# Patient Record
Sex: Male | Born: 1935 | Race: White | Hispanic: No | Marital: Married | Smoking: Former smoker
Health system: Southern US, Community
[De-identification: ages and names within clinical notes are randomized; demographics above are authoritative.]

## PROBLEM LIST (undated history)

## (undated) DIAGNOSIS — D631 Anemia in chronic kidney disease: Principal | ICD-10-CM

## (undated) DIAGNOSIS — N400 Enlarged prostate without lower urinary tract symptoms: Secondary | ICD-10-CM

## (undated) DIAGNOSIS — I2510 Atherosclerotic heart disease of native coronary artery without angina pectoris: Secondary | ICD-10-CM

## (undated) DIAGNOSIS — K449 Diaphragmatic hernia without obstruction or gangrene: Secondary | ICD-10-CM

## (undated) DIAGNOSIS — I10 Essential (primary) hypertension: Secondary | ICD-10-CM

## (undated) DIAGNOSIS — K219 Gastro-esophageal reflux disease without esophagitis: Secondary | ICD-10-CM

## (undated) DIAGNOSIS — D469 Myelodysplastic syndrome, unspecified: Secondary | ICD-10-CM

## (undated) DIAGNOSIS — E538 Deficiency of other specified B group vitamins: Secondary | ICD-10-CM

## (undated) DIAGNOSIS — E785 Hyperlipidemia, unspecified: Secondary | ICD-10-CM

## (undated) DIAGNOSIS — R002 Palpitations: Secondary | ICD-10-CM

## (undated) DIAGNOSIS — F0390 Unspecified dementia without behavioral disturbance: Secondary | ICD-10-CM

## (undated) DIAGNOSIS — N189 Chronic kidney disease, unspecified: Principal | ICD-10-CM

## (undated) DIAGNOSIS — D126 Benign neoplasm of colon, unspecified: Secondary | ICD-10-CM

## (undated) DIAGNOSIS — K5790 Diverticulosis of intestine, part unspecified, without perforation or abscess without bleeding: Secondary | ICD-10-CM

## (undated) DIAGNOSIS — Z86010 Personal history of colonic polyps: Secondary | ICD-10-CM

## (undated) DIAGNOSIS — K222 Esophageal obstruction: Secondary | ICD-10-CM

## (undated) HISTORY — PX: CAROTID ENDARTERECTOMY: SUR193

## (undated) HISTORY — DX: Palpitations: R00.2

## (undated) HISTORY — PX: PTCA: SHX146

## (undated) HISTORY — DX: Atherosclerotic heart disease of native coronary artery without angina pectoris: I25.10

## (undated) HISTORY — DX: Benign prostatic hyperplasia without lower urinary tract symptoms: N40.0

## (undated) HISTORY — PX: TONSILLECTOMY: SUR1361

## (undated) HISTORY — DX: Myelodysplastic syndrome, unspecified: D46.9

## (undated) HISTORY — DX: Chronic kidney disease, unspecified: N18.9

## (undated) HISTORY — DX: Gastro-esophageal reflux disease without esophagitis: K21.9

## (undated) HISTORY — DX: Deficiency of other specified B group vitamins: E53.8

## (undated) HISTORY — PX: INGUINAL HERNIA REPAIR: SUR1180

## (undated) HISTORY — PX: COLONOSCOPY: SHX174

## (undated) HISTORY — DX: Anemia in chronic kidney disease: D63.1

## (undated) HISTORY — DX: Diaphragmatic hernia without obstruction or gangrene: K44.9

## (undated) HISTORY — DX: Personal history of colonic polyps: Z86.010

## (undated) HISTORY — DX: Hyperlipidemia, unspecified: E78.5

## (undated) HISTORY — DX: Esophageal obstruction: K22.2

## (undated) HISTORY — DX: Essential (primary) hypertension: I10

## (undated) HISTORY — PX: QUADRICEPS REPAIR: SHX2281

## (undated) HISTORY — DX: Benign neoplasm of colon, unspecified: D12.6

## (undated) HISTORY — DX: Diverticulosis of intestine, part unspecified, without perforation or abscess without bleeding: K57.90

---

## 1998-11-08 ENCOUNTER — Encounter: Payer: Self-pay | Admitting: Cardiology

## 1998-11-09 ENCOUNTER — Inpatient Hospital Stay (HOSPITAL_COMMUNITY): Admission: AD | Admit: 1998-11-09 | Discharge: 1998-11-10 | Payer: Self-pay | Admitting: Cardiology

## 1999-02-20 ENCOUNTER — Other Ambulatory Visit: Admission: RE | Admit: 1999-02-20 | Discharge: 1999-02-20 | Payer: Self-pay | Admitting: Gastroenterology

## 2000-07-09 ENCOUNTER — Inpatient Hospital Stay (HOSPITAL_COMMUNITY): Admission: EM | Admit: 2000-07-09 | Discharge: 2000-07-12 | Payer: Self-pay | Admitting: Emergency Medicine

## 2000-07-12 ENCOUNTER — Encounter: Payer: Self-pay | Admitting: Cardiology

## 2003-01-16 ENCOUNTER — Emergency Department (HOSPITAL_COMMUNITY): Admission: EM | Admit: 2003-01-16 | Discharge: 2003-01-16 | Payer: Self-pay

## 2003-01-20 ENCOUNTER — Encounter: Payer: Self-pay | Admitting: Orthopedic Surgery

## 2003-01-21 ENCOUNTER — Observation Stay (HOSPITAL_COMMUNITY): Admission: RE | Admit: 2003-01-21 | Discharge: 2003-01-22 | Payer: Self-pay | Admitting: Orthopedic Surgery

## 2004-11-01 ENCOUNTER — Ambulatory Visit: Payer: Self-pay | Admitting: Internal Medicine

## 2004-11-27 ENCOUNTER — Ambulatory Visit: Payer: Self-pay | Admitting: Internal Medicine

## 2004-12-15 ENCOUNTER — Ambulatory Visit: Payer: Self-pay | Admitting: Internal Medicine

## 2004-12-18 ENCOUNTER — Ambulatory Visit (HOSPITAL_COMMUNITY): Admission: RE | Admit: 2004-12-18 | Discharge: 2004-12-18 | Payer: Self-pay | Admitting: Internal Medicine

## 2005-01-02 ENCOUNTER — Ambulatory Visit: Payer: Self-pay | Admitting: Internal Medicine

## 2005-01-04 ENCOUNTER — Ambulatory Visit: Payer: Self-pay | Admitting: Gastroenterology

## 2005-01-24 ENCOUNTER — Ambulatory Visit: Payer: Self-pay | Admitting: Gastroenterology

## 2005-01-31 ENCOUNTER — Ambulatory Visit: Payer: Self-pay | Admitting: Internal Medicine

## 2005-02-28 ENCOUNTER — Ambulatory Visit: Payer: Self-pay | Admitting: Gastroenterology

## 2005-03-01 ENCOUNTER — Ambulatory Visit: Payer: Self-pay | Admitting: Internal Medicine

## 2005-04-02 ENCOUNTER — Ambulatory Visit: Payer: Self-pay | Admitting: Internal Medicine

## 2005-04-09 ENCOUNTER — Ambulatory Visit: Payer: Self-pay | Admitting: Internal Medicine

## 2005-05-02 ENCOUNTER — Ambulatory Visit: Payer: Self-pay | Admitting: Internal Medicine

## 2005-05-29 ENCOUNTER — Ambulatory Visit: Payer: Self-pay | Admitting: Internal Medicine

## 2005-07-02 ENCOUNTER — Ambulatory Visit: Payer: Self-pay | Admitting: Internal Medicine

## 2005-07-25 ENCOUNTER — Ambulatory Visit: Payer: Self-pay | Admitting: Internal Medicine

## 2005-07-31 ENCOUNTER — Ambulatory Visit: Payer: Self-pay | Admitting: Cardiology

## 2005-08-01 ENCOUNTER — Ambulatory Visit: Payer: Self-pay | Admitting: Internal Medicine

## 2005-08-01 ENCOUNTER — Inpatient Hospital Stay (HOSPITAL_COMMUNITY): Admission: EM | Admit: 2005-08-01 | Discharge: 2005-08-03 | Payer: Self-pay | Admitting: Emergency Medicine

## 2005-08-02 ENCOUNTER — Ambulatory Visit: Payer: Self-pay | Admitting: Internal Medicine

## 2005-08-03 ENCOUNTER — Encounter: Payer: Self-pay | Admitting: Internal Medicine

## 2005-08-13 ENCOUNTER — Ambulatory Visit: Payer: Self-pay | Admitting: Internal Medicine

## 2005-08-29 ENCOUNTER — Ambulatory Visit: Payer: Self-pay | Admitting: Internal Medicine

## 2005-10-04 ENCOUNTER — Ambulatory Visit: Payer: Self-pay | Admitting: Internal Medicine

## 2005-11-02 ENCOUNTER — Ambulatory Visit: Payer: Self-pay | Admitting: Internal Medicine

## 2005-11-29 ENCOUNTER — Ambulatory Visit: Payer: Self-pay | Admitting: Internal Medicine

## 2006-01-08 ENCOUNTER — Ambulatory Visit: Payer: Self-pay | Admitting: Internal Medicine

## 2006-01-28 ENCOUNTER — Ambulatory Visit: Payer: Self-pay | Admitting: Internal Medicine

## 2006-01-31 ENCOUNTER — Ambulatory Visit: Payer: Self-pay | Admitting: Internal Medicine

## 2006-02-04 ENCOUNTER — Ambulatory Visit: Payer: Self-pay | Admitting: Internal Medicine

## 2006-05-01 ENCOUNTER — Ambulatory Visit: Payer: Self-pay | Admitting: Internal Medicine

## 2006-06-06 ENCOUNTER — Ambulatory Visit: Payer: Self-pay | Admitting: Internal Medicine

## 2006-07-10 ENCOUNTER — Ambulatory Visit: Payer: Self-pay | Admitting: Internal Medicine

## 2006-08-20 ENCOUNTER — Ambulatory Visit: Payer: Self-pay | Admitting: Internal Medicine

## 2006-09-29 DIAGNOSIS — I10 Essential (primary) hypertension: Secondary | ICD-10-CM | POA: Insufficient documentation

## 2006-09-29 DIAGNOSIS — E785 Hyperlipidemia, unspecified: Secondary | ICD-10-CM

## 2006-09-29 DIAGNOSIS — I2510 Atherosclerotic heart disease of native coronary artery without angina pectoris: Secondary | ICD-10-CM

## 2006-09-29 DIAGNOSIS — N400 Enlarged prostate without lower urinary tract symptoms: Secondary | ICD-10-CM

## 2006-09-29 DIAGNOSIS — Z860100 Personal history of colon polyps, unspecified: Secondary | ICD-10-CM | POA: Insufficient documentation

## 2006-09-29 DIAGNOSIS — K219 Gastro-esophageal reflux disease without esophagitis: Secondary | ICD-10-CM

## 2006-09-29 HISTORY — DX: Personal history of colonic polyps: Z86.010

## 2006-09-29 HISTORY — DX: Personal history of colon polyps, unspecified: Z86.0100

## 2006-09-30 ENCOUNTER — Ambulatory Visit: Payer: Self-pay | Admitting: Internal Medicine

## 2006-09-30 LAB — CONVERTED CEMR LAB
ALT: 28 units/L (ref 0–40)
AST: 27 units/L (ref 0–37)
Albumin: 3.9 g/dL (ref 3.5–5.2)
Alkaline Phosphatase: 45 units/L (ref 39–117)
BUN: 23 mg/dL (ref 6–23)
Basophils Absolute: 0 10*3/uL (ref 0.0–0.1)
Basophils Relative: 0.4 % (ref 0.0–1.0)
Bilirubin, Direct: 0.2 mg/dL (ref 0.0–0.3)
CO2: 26 meq/L (ref 19–32)
Calcium: 9.3 mg/dL (ref 8.4–10.5)
Chloride: 109 meq/L (ref 96–112)
Chol/HDL Ratio, serum: 2.2
Cholesterol, target level: 200 mg/dL
Cholesterol: 134 mg/dL (ref 0–200)
Creatinine, Ser: 1.4 mg/dL (ref 0.4–1.5)
Eosinophil percent: 2.1 % (ref 0.0–5.0)
GFR calc non Af Amer: 53 mL/min
Glomerular Filtration Rate, Af Am: 64 mL/min/{1.73_m2}
Glucose, Bld: 90 mg/dL (ref 70–99)
HCT: 40 % (ref 39.0–52.0)
HDL goal, serum: 40 mg/dL
HDL: 60.9 mg/dL (ref >39.0)
Hemoglobin: 13.2 g/dL (ref 13.0–17.0)
LDL Cholesterol: 64 mg/dL (ref 0–99)
LDL Goal: 100 mg/dL
Lymphocytes Relative: 20.3 % (ref 12.0–46.0)
MCHC: 33.1 g/dL (ref 30.0–36.0)
MCV: 96 fL (ref 78.0–100.0)
Monocytes Absolute: 0.4 10*3/uL (ref 0.2–0.7)
Monocytes Relative: 7 % (ref 3.0–11.0)
Neutro Abs: 4.1 10*3/uL (ref 1.4–7.7)
Neutrophils Relative %: 70.2 % (ref 43.0–77.0)
PSA: 0.72 ng/mL (ref 0.10–4.00)
Platelets: 264 10*3/uL (ref 150–400)
Potassium: 4.5 meq/L (ref 3.5–5.1)
RBC: 4.16 M/uL — ABNORMAL LOW (ref 4.22–5.81)
RDW: 12.1 % (ref 11.5–14.6)
Sodium: 144 meq/L (ref 135–145)
Total Bilirubin: 0.8 mg/dL (ref 0.3–1.2)
Total Protein: 6.6 g/dL (ref 6.0–8.3)
Triglyceride fasting, serum: 46 mg/dL (ref 0–149)
VLDL: 9 mg/dL (ref 0–40)
WBC: 5.8 10*3/uL (ref 4.5–10.5)

## 2006-10-23 ENCOUNTER — Ambulatory Visit: Payer: Self-pay | Admitting: Internal Medicine

## 2006-10-30 ENCOUNTER — Ambulatory Visit: Payer: Self-pay | Admitting: Internal Medicine

## 2006-12-03 ENCOUNTER — Ambulatory Visit: Payer: Self-pay | Admitting: Internal Medicine

## 2007-01-03 ENCOUNTER — Ambulatory Visit: Payer: Self-pay | Admitting: Internal Medicine

## 2007-01-08 ENCOUNTER — Encounter: Admission: RE | Admit: 2007-01-08 | Discharge: 2007-01-08 | Payer: Self-pay | Admitting: Internal Medicine

## 2007-01-16 ENCOUNTER — Ambulatory Visit: Payer: Self-pay | Admitting: Internal Medicine

## 2007-01-30 ENCOUNTER — Ambulatory Visit: Payer: Self-pay | Admitting: Internal Medicine

## 2007-03-03 ENCOUNTER — Ambulatory Visit: Payer: Self-pay | Admitting: Internal Medicine

## 2007-03-05 ENCOUNTER — Ambulatory Visit: Payer: Self-pay | Admitting: Gastroenterology

## 2007-03-25 ENCOUNTER — Encounter: Payer: Self-pay | Admitting: Internal Medicine

## 2007-03-25 ENCOUNTER — Ambulatory Visit: Payer: Self-pay | Admitting: Gastroenterology

## 2007-03-25 ENCOUNTER — Encounter (INDEPENDENT_AMBULATORY_CARE_PROVIDER_SITE_OTHER): Payer: Self-pay | Admitting: Specialist

## 2007-04-03 ENCOUNTER — Ambulatory Visit: Payer: Self-pay | Admitting: Internal Medicine

## 2007-04-09 ENCOUNTER — Ambulatory Visit: Payer: Self-pay | Admitting: Internal Medicine

## 2007-05-06 ENCOUNTER — Ambulatory Visit: Payer: Self-pay | Admitting: Internal Medicine

## 2007-05-08 ENCOUNTER — Ambulatory Visit (HOSPITAL_COMMUNITY): Admission: RE | Admit: 2007-05-08 | Discharge: 2007-05-09 | Payer: Self-pay | Admitting: Orthopedic Surgery

## 2007-06-04 ENCOUNTER — Ambulatory Visit: Payer: Self-pay | Admitting: Internal Medicine

## 2007-07-03 ENCOUNTER — Ambulatory Visit: Payer: Self-pay | Admitting: Internal Medicine

## 2007-07-03 DIAGNOSIS — D518 Other vitamin B12 deficiency anemias: Secondary | ICD-10-CM

## 2007-07-11 ENCOUNTER — Ambulatory Visit: Payer: Self-pay | Admitting: Internal Medicine

## 2007-08-01 ENCOUNTER — Ambulatory Visit: Payer: Self-pay | Admitting: Internal Medicine

## 2007-09-02 ENCOUNTER — Ambulatory Visit: Payer: Self-pay | Admitting: Internal Medicine

## 2007-10-07 ENCOUNTER — Ambulatory Visit: Payer: Self-pay | Admitting: Internal Medicine

## 2007-10-22 ENCOUNTER — Encounter: Payer: Self-pay | Admitting: Internal Medicine

## 2007-10-23 ENCOUNTER — Ambulatory Visit: Payer: Self-pay | Admitting: Internal Medicine

## 2007-10-27 LAB — CONVERTED CEMR LAB
ALT: 24 units/L (ref 0–53)
AST: 24 units/L (ref 0–37)
Albumin: 3.9 g/dL (ref 3.5–5.2)
Alkaline Phosphatase: 47 units/L (ref 39–117)
BUN: 18 mg/dL (ref 6–23)
Basophils Absolute: 0 10*3/uL (ref 0.0–0.1)
Basophils Relative: 0.2 % (ref 0.0–1.0)
Bilirubin, Direct: 0.3 mg/dL (ref 0.0–0.3)
CO2: 24 meq/L (ref 19–32)
Calcium: 9.3 mg/dL (ref 8.4–10.5)
Chloride: 109 meq/L (ref 96–112)
Cholesterol: 139 mg/dL (ref 0–200)
Creatinine, Ser: 1.2 mg/dL (ref 0.4–1.5)
Eosinophils Absolute: 0 10*3/uL (ref 0.0–0.6)
Eosinophils Relative: 0.6 % (ref 0.0–5.0)
Ferritin: 370 ng/mL — ABNORMAL HIGH (ref 22.0–322.0)
GFR calc Af Amer: 76 mL/min
GFR calc non Af Amer: 63 mL/min
Glucose, Bld: 90 mg/dL (ref 70–99)
HCT: 34.7 % — ABNORMAL LOW (ref 39.0–52.0)
HDL: 78.4 mg/dL (ref >39.0)
Hemoglobin: 11.9 g/dL — ABNORMAL LOW (ref 13.0–17.0)
Iron: 121 ug/dL (ref 42–165)
LDL Cholesterol: 55 mg/dL (ref 0–99)
Lymphocytes Relative: 17 % (ref 12.0–46.0)
MCHC: 34.2 g/dL (ref 30.0–36.0)
MCV: 96.1 fL (ref 78.0–100.0)
Monocytes Absolute: 0.4 10*3/uL (ref 0.2–0.7)
Monocytes Relative: 6.7 % (ref 3.0–11.0)
Neutro Abs: 4.6 10*3/uL (ref 1.4–7.7)
Neutrophils Relative %: 75.5 % (ref 43.0–77.0)
PSA: 0.57 ng/mL (ref 0.10–4.00)
Platelets: 261 10*3/uL (ref 150–400)
Potassium: 5.2 meq/L — ABNORMAL HIGH (ref 3.5–5.1)
RBC: 3.61 M/uL — ABNORMAL LOW (ref 4.22–5.81)
RDW: 12.7 % (ref 11.5–14.6)
Saturation Ratios: 40.2 % (ref 20.0–50.0)
Sodium: 143 meq/L (ref 135–145)
Total Bilirubin: 1.1 mg/dL (ref 0.3–1.2)
Total CHOL/HDL Ratio: 1.8
Total Protein: 6 g/dL (ref 6.0–8.3)
Transferrin: 215 mg/dL (ref >=212.0)
Triglycerides: 30 mg/dL (ref 0–149)
VLDL: 6 mg/dL (ref 0–40)
Vitamin B-12: >1500 pg/mL — ABNORMAL HIGH (ref 211–911)
WBC: 6 10*3/uL (ref 4.5–10.5)

## 2007-11-21 ENCOUNTER — Ambulatory Visit: Payer: Self-pay | Admitting: Internal Medicine

## 2007-11-21 LAB — CONVERTED CEMR LAB
Basophils Absolute: 0 10*3/uL (ref 0.0–0.1)
Eosinophils Absolute: 0.2 10*3/uL (ref 0.0–0.6)
Eosinophils Relative: 2.7 % (ref 0.0–5.0)
Lymphocytes Relative: 23.5 % (ref 12.0–46.0)
MCHC: 34.6 g/dL (ref 30.0–36.0)
MCV: 95.2 fL (ref 78.0–100.0)
Monocytes Relative: 6.3 % (ref 3.0–11.0)
Neutro Abs: 4 10*3/uL (ref 1.4–7.7)
Platelets: 294 10*3/uL (ref 150–400)
RBC: 3.7 M/uL — ABNORMAL LOW (ref 4.22–5.81)

## 2007-11-27 ENCOUNTER — Ambulatory Visit: Payer: Self-pay | Admitting: Internal Medicine

## 2007-11-28 ENCOUNTER — Telehealth: Payer: Self-pay | Admitting: Internal Medicine

## 2007-12-30 ENCOUNTER — Ambulatory Visit: Payer: Self-pay | Admitting: Internal Medicine

## 2008-01-29 ENCOUNTER — Ambulatory Visit: Payer: Self-pay | Admitting: Internal Medicine

## 2008-02-16 ENCOUNTER — Telehealth: Payer: Self-pay | Admitting: Internal Medicine

## 2008-03-01 ENCOUNTER — Ambulatory Visit: Payer: Self-pay | Admitting: Internal Medicine

## 2008-03-22 ENCOUNTER — Ambulatory Visit: Payer: Self-pay | Admitting: Internal Medicine

## 2008-03-24 LAB — CONVERTED CEMR LAB
ALT: 24 units/L (ref 0–53)
AST: 21 units/L (ref 0–37)
Basophils Absolute: 0 10*3/uL (ref 0.0–0.1)
Basophils Relative: 0.3 % (ref 0.0–1.0)
Bilirubin, Direct: 0.1 mg/dL (ref 0.0–0.3)
CO2: 28 meq/L (ref 19–32)
Chloride: 110 meq/L (ref 96–112)
Glucose, Bld: 101 mg/dL — ABNORMAL HIGH (ref 70–99)
Hemoglobin: 10 g/dL — ABNORMAL LOW (ref 13.0–17.0)
Lymphocytes Relative: 24.2 % (ref 12.0–46.0)
MCHC: 33.6 g/dL (ref 30.0–36.0)
Monocytes Relative: 4.6 % (ref 3.0–12.0)
Neutro Abs: 3.7 10*3/uL (ref 1.4–7.7)
Neutrophils Relative %: 69.3 % (ref 43.0–77.0)
RBC: 3.02 M/uL — ABNORMAL LOW (ref 4.22–5.81)
Sodium: 144 meq/L (ref 135–145)
Total Bilirubin: 0.7 mg/dL (ref 0.3–1.2)
Total Protein: 5.6 g/dL — ABNORMAL LOW (ref 6.0–8.3)

## 2008-03-29 ENCOUNTER — Emergency Department (HOSPITAL_COMMUNITY): Admission: EM | Admit: 2008-03-29 | Discharge: 2008-03-30 | Payer: Self-pay | Admitting: Emergency Medicine

## 2008-03-29 ENCOUNTER — Ambulatory Visit: Payer: Self-pay | Admitting: Internal Medicine

## 2008-03-30 ENCOUNTER — Ambulatory Visit: Payer: Self-pay | Admitting: Hematology & Oncology

## 2008-03-30 ENCOUNTER — Telehealth: Payer: Self-pay | Admitting: Internal Medicine

## 2008-03-31 LAB — CONVERTED CEMR LAB
Albumin: 3.7 g/dL (ref 3.5–5.2)
Alkaline Phosphatase: 50 units/L (ref 39–117)
Basophils Absolute: 0 10*3/uL (ref 0.0–0.1)
Eosinophils Absolute: 0.1 10*3/uL (ref 0.0–0.7)
HCT: 33 % — ABNORMAL LOW (ref 39.0–52.0)
Hemoglobin: 11 g/dL — ABNORMAL LOW (ref 13.0–17.0)
Iron: 106 ug/dL (ref 42–165)
MCHC: 33.2 g/dL (ref 30.0–36.0)
MCV: 99.4 fL (ref 78.0–100.0)
Monocytes Absolute: 0.4 10*3/uL (ref 0.1–1.0)
Neutro Abs: 2.4 10*3/uL (ref 1.4–7.7)
Platelets: 280 10*3/uL (ref 150–400)
RDW: 13.2 % (ref 11.5–14.6)
Saturation Ratios: 39.4 % (ref 20.0–50.0)
Total Protein: 5.8 g/dL — ABNORMAL LOW (ref 6.0–8.3)
Transferrin: 192.2 mg/dL — ABNORMAL LOW (ref >=212.0)
Vitamin B-12: 658 pg/mL (ref 211–911)

## 2008-04-22 ENCOUNTER — Encounter: Payer: Self-pay | Admitting: Internal Medicine

## 2008-04-22 LAB — CBC & DIFF AND RETIC
Basophils Absolute: 0 10*3/uL (ref 0.0–0.1)
EOS%: 2.1 % (ref 0.0–7.0)
HCT: 30.8 % — ABNORMAL LOW (ref 38.7–49.9)
HGB: 10.8 g/dL — ABNORMAL LOW (ref 13.0–17.1)
MCH: 33.5 pg — ABNORMAL HIGH (ref 28.0–33.4)
MCHC: 35 g/dL (ref 32.0–35.9)
MCV: 95.7 fL (ref 81.6–98.0)
MONO%: 8.1 % (ref 0.0–13.0)
NEUT%: 57.2 % (ref 40.0–75.0)
RDW: 13.1 % (ref 11.2–14.6)

## 2008-04-22 LAB — CHCC SMEAR

## 2008-04-23 ENCOUNTER — Ambulatory Visit: Payer: Self-pay | Admitting: Internal Medicine

## 2008-04-26 LAB — PROTEIN ELECTROPHORESIS, SERUM
Albumin ELP: 66.3 % — ABNORMAL HIGH (ref 55.8–66.1)
Beta 2: 4 % (ref 3.2–6.5)
Total Protein, Serum Electrophoresis: 6.3 g/dL (ref 6.0–8.3)

## 2008-04-26 LAB — COMPREHENSIVE METABOLIC PANEL
BUN: 41 mg/dL — ABNORMAL HIGH (ref 6–23)
CO2: 23 mEq/L (ref 19–32)
Calcium: 9.2 mg/dL (ref 8.4–10.5)
Chloride: 109 mEq/L (ref 96–112)
Creatinine, Ser: 1.52 mg/dL — ABNORMAL HIGH (ref 0.40–1.50)

## 2008-04-26 LAB — ERYTHROPOIETIN: Erythropoietin: 6.6 m[IU]/mL (ref 2.6–34.0)

## 2008-04-26 LAB — KAPPA/LAMBDA LIGHT CHAINS
Kappa:Lambda Ratio: 3.06 — ABNORMAL HIGH (ref 0.26–1.65)
Lambda Free Lght Chn: 1.01 mg/dL (ref 0.57–2.63)

## 2008-04-26 LAB — HAPTOGLOBIN: Haptoglobin: 50 mg/dL (ref 16–200)

## 2008-05-24 ENCOUNTER — Ambulatory Visit: Payer: Self-pay | Admitting: Internal Medicine

## 2008-05-25 ENCOUNTER — Ambulatory Visit: Payer: Self-pay | Admitting: Hematology

## 2008-05-27 LAB — CBC WITH DIFFERENTIAL/PLATELET
BASO%: 0.3 % (ref 0.0–2.0)
Basophils Absolute: 0 10*3/uL (ref 0.0–0.1)
EOS%: 1.6 % (ref 0.0–7.0)
HGB: 10.4 g/dL — ABNORMAL LOW (ref 13.0–17.1)
MCH: 33.5 pg — ABNORMAL HIGH (ref 28.0–33.4)
MCHC: 35.4 g/dL (ref 32.0–35.9)
MONO%: 6.1 % (ref 0.0–13.0)
RBC: 3.11 10*6/uL — ABNORMAL LOW (ref 4.20–5.71)
RDW: 12.4 % (ref 11.2–14.6)
lymph#: 1.4 10*3/uL (ref 0.9–3.3)

## 2008-06-15 LAB — CBC WITH DIFFERENTIAL/PLATELET
Basophils Absolute: 0 10*3/uL (ref 0.0–0.1)
Eosinophils Absolute: 0.1 10*3/uL (ref 0.0–0.5)
HGB: 11.6 g/dL — ABNORMAL LOW (ref 13.0–17.1)
NEUT#: 2.1 10*3/uL (ref 1.5–6.5)
RBC: 3.42 10*6/uL — ABNORMAL LOW (ref 4.20–5.71)
RDW: 14.2 % (ref 11.2–14.6)
WBC: 3.7 10*3/uL — ABNORMAL LOW (ref 4.0–10.0)
lymph#: 1.2 10*3/uL (ref 0.9–3.3)

## 2008-06-21 ENCOUNTER — Ambulatory Visit: Payer: Self-pay | Admitting: Internal Medicine

## 2008-06-22 ENCOUNTER — Ambulatory Visit: Payer: Self-pay | Admitting: Hematology & Oncology

## 2008-06-24 ENCOUNTER — Encounter: Payer: Self-pay | Admitting: Internal Medicine

## 2008-06-24 LAB — CBC WITH DIFFERENTIAL (CANCER CENTER ONLY)
BASO#: 0 10*3/uL (ref 0.0–0.2)
Eosinophils Absolute: 0.1 10*3/uL (ref 0.0–0.5)
LYMPH%: 33.5 % (ref 14.0–48.0)
MCH: 33 pg (ref 28.0–33.4)
MCV: 98 fL (ref 82–98)
MONO%: 10.2 % (ref 0.0–13.0)
NEUT%: 52.6 % (ref 40.0–80.0)
Platelets: 313 10*3/uL (ref 145–400)
RBC: 3.91 10*6/uL — ABNORMAL LOW (ref 4.20–5.70)

## 2008-06-24 LAB — FERRITIN: Ferritin: 297 ng/mL (ref 22–322)

## 2008-06-24 LAB — CHCC SATELLITE - SMEAR

## 2008-06-24 LAB — RETICULOCYTES (CHCC)
ABS Retic: 105 10*3/uL (ref 19.0–186.0)
RBC.: 4.04 MIL/uL — ABNORMAL LOW (ref 4.22–5.81)

## 2008-07-19 ENCOUNTER — Ambulatory Visit: Payer: Self-pay | Admitting: Hematology

## 2008-07-22 ENCOUNTER — Ambulatory Visit: Payer: Self-pay | Admitting: Internal Medicine

## 2008-07-22 LAB — CBC WITH DIFFERENTIAL (CANCER CENTER ONLY)
BASO%: 0.5 % (ref 0.0–2.0)
LYMPH%: 28.3 % (ref 14.0–48.0)
MCH: 32 pg (ref 28.0–33.4)
MCHC: 34.1 g/dL (ref 32.0–35.9)
MCV: 94 fL (ref 82–98)
MONO%: 7.2 % (ref 0.0–13.0)
Platelets: 211 10*3/uL (ref 145–400)
RDW: 11.8 % (ref 10.5–14.6)

## 2008-07-28 ENCOUNTER — Telehealth: Payer: Self-pay | Admitting: Internal Medicine

## 2008-08-02 ENCOUNTER — Telehealth: Payer: Self-pay | Admitting: Internal Medicine

## 2008-08-18 ENCOUNTER — Ambulatory Visit: Payer: Self-pay | Admitting: Hematology & Oncology

## 2008-08-19 ENCOUNTER — Encounter: Payer: Self-pay | Admitting: Internal Medicine

## 2008-08-19 LAB — CBC WITH DIFFERENTIAL (CANCER CENTER ONLY)
BASO%: 0.3 % (ref 0.0–2.0)
EOS%: 3.2 % (ref 0.0–7.0)
HCT: 31.8 % — ABNORMAL LOW (ref 38.7–49.9)
LYMPH#: 1.4 10*3/uL (ref 0.9–3.3)
LYMPH%: 32.5 % (ref 14.0–48.0)
MCHC: 35.2 g/dL (ref 32.0–35.9)
NEUT%: 57.9 % (ref 40.0–80.0)
Platelets: 251 10*3/uL (ref 145–400)
RDW: 11.3 % (ref 10.5–14.6)

## 2008-08-24 ENCOUNTER — Ambulatory Visit: Payer: Self-pay | Admitting: Internal Medicine

## 2008-09-16 LAB — CBC WITH DIFFERENTIAL (CANCER CENTER ONLY)
BASO%: 0.3 % (ref 0.0–2.0)
EOS%: 3.2 % (ref 0.0–7.0)
LYMPH#: 1.4 10*3/uL (ref 0.9–3.3)
LYMPH%: 31.9 % (ref 14.0–48.0)
MCHC: 34.7 g/dL (ref 32.0–35.9)
MONO#: 0.3 10*3/uL (ref 0.1–0.9)
Platelets: 258 10*3/uL (ref 145–400)
RDW: 13.2 % (ref 10.5–14.6)
WBC: 4.4 10*3/uL (ref 4.0–10.0)

## 2008-09-23 ENCOUNTER — Ambulatory Visit: Payer: Self-pay | Admitting: Internal Medicine

## 2008-10-07 ENCOUNTER — Ambulatory Visit: Payer: Self-pay | Admitting: Internal Medicine

## 2008-10-12 LAB — CONVERTED CEMR LAB
AST: 20 units/L (ref 0–37)
Albumin: 3.8 g/dL (ref 3.5–5.2)
Alkaline Phosphatase: 45 units/L (ref 39–117)
BUN: 52 mg/dL — ABNORMAL HIGH (ref 6–23)
Basophils Relative: 0.3 % (ref 0.0–3.0)
Chloride: 108 meq/L (ref 96–112)
Eosinophils Relative: 2.2 % (ref 0.0–5.0)
GFR calc Af Amer: 45 mL/min
Glucose, Bld: 106 mg/dL — ABNORMAL HIGH (ref 70–99)
HCT: 34.3 % — ABNORMAL LOW (ref 39.0–52.0)
HDL: 57.6 mg/dL (ref >39.0)
Monocytes Absolute: 0.3 10*3/uL (ref 0.1–1.0)
Monocytes Relative: 9.3 % (ref 3.0–12.0)
Platelets: 185 10*3/uL (ref 150–400)
Potassium: 5.3 meq/L — ABNORMAL HIGH (ref 3.5–5.1)
RBC: 3.44 M/uL — ABNORMAL LOW (ref 4.22–5.81)
Total CHOL/HDL Ratio: 1.8
Total Protein: 6 g/dL (ref 6.0–8.3)
WBC: 3.6 10*3/uL — ABNORMAL LOW (ref 4.5–10.5)

## 2008-10-15 ENCOUNTER — Ambulatory Visit: Payer: Self-pay | Admitting: Hematology & Oncology

## 2008-10-15 LAB — CBC WITH DIFFERENTIAL (CANCER CENTER ONLY)
BASO%: 0.6 % (ref 0.0–2.0)
HCT: 33.4 % — ABNORMAL LOW (ref 38.7–49.9)
LYMPH%: 34.1 % (ref 14.0–48.0)
MCH: 33.2 pg (ref 28.0–33.4)
MCHC: 34.3 g/dL (ref 32.0–35.9)
MCV: 97 fL (ref 82–98)
MONO#: 0.3 10*3/uL (ref 0.1–0.9)
MONO%: 8.1 % (ref 0.0–13.0)
NEUT%: 53.8 % (ref 40.0–80.0)
RDW: 12.7 % (ref 10.5–14.6)
WBC: 3.5 10*3/uL — ABNORMAL LOW (ref 4.0–10.0)

## 2008-10-21 ENCOUNTER — Encounter: Payer: Self-pay | Admitting: Internal Medicine

## 2008-10-21 LAB — CBC WITH DIFFERENTIAL (CANCER CENTER ONLY)
BASO%: 0.3 % (ref 0.0–2.0)
HCT: 32.2 % — ABNORMAL LOW (ref 38.7–49.9)
LYMPH%: 34.9 % (ref 14.0–48.0)
MCHC: 34.1 g/dL (ref 32.0–35.9)
MCV: 98 fL (ref 82–98)
MONO#: 0.3 10*3/uL (ref 0.1–0.9)
NEUT%: 54.2 % (ref 40.0–80.0)
RDW: 12.7 % (ref 10.5–14.6)
WBC: 4.4 10*3/uL (ref 4.0–10.0)

## 2008-10-21 LAB — CHCC SATELLITE - SMEAR

## 2008-10-21 LAB — RETICULOCYTES (CHCC): ABS Retic: 17.1 10*3/uL — ABNORMAL LOW (ref 19.0–186.0)

## 2008-10-21 LAB — FERRITIN: Ferritin: 877 ng/mL — ABNORMAL HIGH (ref 22–322)

## 2008-10-22 ENCOUNTER — Ambulatory Visit: Payer: Self-pay | Admitting: Internal Medicine

## 2008-11-05 ENCOUNTER — Telehealth: Payer: Self-pay | Admitting: Internal Medicine

## 2008-11-18 LAB — CBC WITH DIFFERENTIAL (CANCER CENTER ONLY)
BASO#: 0 10*3/uL (ref 0.0–0.2)
Eosinophils Absolute: 0.2 10*3/uL (ref 0.0–0.5)
HCT: 36.2 % — ABNORMAL LOW (ref 38.7–49.9)
LYMPH%: 34 % (ref 14.0–48.0)
MCH: 33.3 pg (ref 28.0–33.4)
MCV: 98 fL (ref 82–98)
MONO#: 0.3 10*3/uL (ref 0.1–0.9)
MONO%: 6.8 % (ref 0.0–13.0)
NEUT%: 53.8 % (ref 40.0–80.0)
RBC: 3.68 10*6/uL — ABNORMAL LOW (ref 4.20–5.70)
WBC: 4.5 10*3/uL (ref 4.0–10.0)

## 2008-11-22 ENCOUNTER — Ambulatory Visit: Payer: Self-pay | Admitting: Internal Medicine

## 2008-12-15 ENCOUNTER — Ambulatory Visit: Payer: Self-pay | Admitting: Hematology & Oncology

## 2008-12-16 LAB — CBC WITH DIFFERENTIAL (CANCER CENTER ONLY)
HCT: 31 % — ABNORMAL LOW (ref 38.7–49.9)
LYMPH#: 1.3 10*3/uL (ref 0.9–3.3)
LYMPH%: 34.1 % (ref 14.0–48.0)
MCHC: 34.6 g/dL (ref 32.0–35.9)
NEUT%: 51.9 % (ref 40.0–80.0)
RDW: 11.5 % (ref 10.5–14.6)

## 2008-12-22 ENCOUNTER — Ambulatory Visit: Payer: Self-pay | Admitting: Internal Medicine

## 2008-12-22 ENCOUNTER — Telehealth: Payer: Self-pay | Admitting: Internal Medicine

## 2009-01-07 ENCOUNTER — Telehealth: Payer: Self-pay | Admitting: Internal Medicine

## 2009-01-12 ENCOUNTER — Ambulatory Visit: Payer: Self-pay | Admitting: Internal Medicine

## 2009-01-12 ENCOUNTER — Encounter: Payer: Self-pay | Admitting: Internal Medicine

## 2009-01-12 LAB — CONVERTED CEMR LAB
BUN: 65 mg/dL — ABNORMAL HIGH (ref 6–23)
Calcium: 9 mg/dL (ref 8.4–10.5)
Creatinine, Ser: 1.8 mg/dL — ABNORMAL HIGH (ref 0.4–1.5)
GFR calc Af Amer: 48 mL/min
Glucose, Bld: 100 mg/dL — ABNORMAL HIGH (ref 70–99)

## 2009-01-13 ENCOUNTER — Encounter: Payer: Self-pay | Admitting: Internal Medicine

## 2009-01-13 LAB — CBC WITH DIFFERENTIAL (CANCER CENTER ONLY)
BASO#: 0 10*3/uL (ref 0.0–0.2)
Eosinophils Absolute: 0.2 10*3/uL (ref 0.0–0.5)
HCT: 32.2 % — ABNORMAL LOW (ref 38.7–49.9)
HGB: 10.7 g/dL — ABNORMAL LOW (ref 13.0–17.1)
LYMPH%: 37.2 % (ref 14.0–48.0)
MCV: 98 fL (ref 82–98)
MONO#: 0.2 10*3/uL (ref 0.1–0.9)
NEUT%: 52.5 % (ref 40.0–80.0)
RBC: 3.28 10*6/uL — ABNORMAL LOW (ref 4.20–5.70)
WBC: 3.4 10*3/uL — ABNORMAL LOW (ref 4.0–10.0)

## 2009-01-19 ENCOUNTER — Ambulatory Visit: Payer: Self-pay | Admitting: Internal Medicine

## 2009-01-28 LAB — CONVERTED CEMR LAB
BUN: 34 mg/dL — ABNORMAL HIGH (ref 6–23)
Calcium: 9.3 mg/dL (ref 8.4–10.5)
GFR calc Af Amer: 51 mL/min
Glucose, Bld: 113 mg/dL — ABNORMAL HIGH (ref 70–99)

## 2009-02-01 ENCOUNTER — Ambulatory Visit: Payer: Self-pay | Admitting: Internal Medicine

## 2009-02-01 LAB — CONVERTED CEMR LAB
BUN: 35 mg/dL — ABNORMAL HIGH (ref 6–23)
Chloride: 110 meq/L (ref 96–112)
Creatinine, Ser: 1.5 mg/dL (ref 0.4–1.5)
GFR calc non Af Amer: 48.58 mL/min (ref >60)

## 2009-02-07 ENCOUNTER — Ambulatory Visit: Payer: Self-pay | Admitting: Hematology & Oncology

## 2009-02-09 LAB — CBC WITH DIFFERENTIAL (CANCER CENTER ONLY)
BASO#: 0 10*3/uL (ref 0.0–0.2)
EOS%: 4.9 % (ref 0.0–7.0)
HCT: 34.2 % — ABNORMAL LOW (ref 38.7–49.9)
HGB: 11.2 g/dL — ABNORMAL LOW (ref 13.0–17.1)
MCH: 32.8 pg (ref 28.0–33.4)
MCHC: 32.7 g/dL (ref 32.0–35.9)
MONO%: 5.7 % (ref 0.0–13.0)
NEUT%: 52.8 % (ref 40.0–80.0)

## 2009-02-22 ENCOUNTER — Ambulatory Visit: Payer: Self-pay | Admitting: Internal Medicine

## 2009-03-03 ENCOUNTER — Encounter: Payer: Self-pay | Admitting: Internal Medicine

## 2009-03-10 ENCOUNTER — Telehealth (INDEPENDENT_AMBULATORY_CARE_PROVIDER_SITE_OTHER): Payer: Self-pay | Admitting: *Deleted

## 2009-03-10 LAB — CBC WITH DIFFERENTIAL (CANCER CENTER ONLY)
BASO#: 0 10*3/uL (ref 0.0–0.2)
EOS%: 4.4 % (ref 0.0–7.0)
HCT: 31.3 % — ABNORMAL LOW (ref 38.7–49.9)
HGB: 10.8 g/dL — ABNORMAL LOW (ref 13.0–17.1)
LYMPH#: 1.4 10*3/uL (ref 0.9–3.3)
MCHC: 34.6 g/dL (ref 32.0–35.9)
MONO#: 0.3 10*3/uL (ref 0.1–0.9)
NEUT%: 57.7 % (ref 40.0–80.0)

## 2009-03-22 ENCOUNTER — Ambulatory Visit: Payer: Self-pay | Admitting: Internal Medicine

## 2009-04-06 ENCOUNTER — Ambulatory Visit: Payer: Self-pay | Admitting: Hematology & Oncology

## 2009-04-07 ENCOUNTER — Encounter: Payer: Self-pay | Admitting: Internal Medicine

## 2009-04-07 LAB — RETICULOCYTES (CHCC)
ABS Retic: 16.7 10*3/uL — ABNORMAL LOW (ref 19.0–186.0)
RBC.: 3.33 MIL/uL — ABNORMAL LOW (ref 4.22–5.81)
Retic Ct Pct: 0.5 % (ref 0.4–3.1)

## 2009-04-07 LAB — CBC WITH DIFFERENTIAL (CANCER CENTER ONLY)
BASO#: 0 10*3/uL (ref 0.0–0.2)
BASO%: 0.3 % (ref 0.0–2.0)
Eosinophils Absolute: 0.2 10*3/uL (ref 0.0–0.5)
HCT: 32.4 % — ABNORMAL LOW (ref 38.7–49.9)
HGB: 10.9 g/dL — ABNORMAL LOW (ref 13.0–17.1)
LYMPH#: 1.5 10*3/uL (ref 0.9–3.3)
MONO#: 0.2 10*3/uL (ref 0.1–0.9)
NEUT%: 53.5 % (ref 40.0–80.0)
RBC: 3.32 10*6/uL — ABNORMAL LOW (ref 4.20–5.70)
RDW: 11.5 % (ref 10.5–14.6)
WBC: 4.2 10*3/uL (ref 4.0–10.0)

## 2009-04-07 LAB — CHCC SATELLITE - SMEAR

## 2009-04-21 ENCOUNTER — Ambulatory Visit: Payer: Self-pay | Admitting: Internal Medicine

## 2009-05-13 ENCOUNTER — Ambulatory Visit: Payer: Self-pay | Admitting: Internal Medicine

## 2009-05-16 LAB — CONVERTED CEMR LAB
AST: 17 units/L (ref 0–37)
Albumin: 4 g/dL (ref 3.5–5.2)
Alkaline Phosphatase: 43 units/L (ref 39–117)
Basophils Absolute: 0 10*3/uL (ref 0.0–0.1)
Bilirubin, Direct: 0 mg/dL (ref 0.0–0.3)
Eosinophils Relative: 2.9 % (ref 0.0–5.0)
GFR calc non Af Amer: 36.95 mL/min (ref >60)
Glucose, Bld: 100 mg/dL — ABNORMAL HIGH (ref 70–99)
LDL Cholesterol: 45 mg/dL (ref 0–99)
MCV: 98.4 fL (ref 78.0–100.0)
Monocytes Absolute: 0.5 10*3/uL (ref 0.1–1.0)
Neutrophils Relative %: 57.6 % (ref 43.0–77.0)
Platelets: 248 10*3/uL (ref 150.0–400.0)
Potassium: 5.2 meq/L — ABNORMAL HIGH (ref 3.5–5.1)
Sodium: 141 meq/L (ref 135–145)
TSH: 1.71 microintl units/mL (ref 0.35–5.50)
Total Bilirubin: 0.9 mg/dL (ref 0.3–1.2)
Total CHOL/HDL Ratio: 2
VLDL: 10.2 mg/dL (ref 0.0–40.0)
WBC: 4.8 10*3/uL (ref 4.5–10.5)

## 2009-05-24 ENCOUNTER — Ambulatory Visit: Payer: Self-pay | Admitting: Internal Medicine

## 2009-06-07 ENCOUNTER — Ambulatory Visit: Payer: Self-pay | Admitting: Hematology & Oncology

## 2009-06-09 ENCOUNTER — Encounter: Payer: Self-pay | Admitting: Internal Medicine

## 2009-06-09 LAB — CBC WITH DIFFERENTIAL (CANCER CENTER ONLY)
BASO%: 0.2 % (ref 0.0–2.0)
LYMPH#: 1.2 10*3/uL (ref 0.9–3.3)
MONO#: 0.5 10*3/uL (ref 0.1–0.9)
NEUT#: 4.9 10*3/uL (ref 1.5–6.5)
Platelets: 284 10*3/uL (ref 145–400)
RBC: 2.54 10*6/uL — ABNORMAL LOW (ref 4.20–5.70)
RDW: 12.2 % (ref 10.5–14.6)
WBC: 6.8 10*3/uL (ref 4.0–10.0)

## 2009-06-09 LAB — CHCC SATELLITE - SMEAR

## 2009-06-09 LAB — FERRITIN: Ferritin: 861 ng/mL — ABNORMAL HIGH (ref 22–322)

## 2009-06-09 LAB — RETICULOCYTES (CHCC): Retic Ct Pct: 1.2 % (ref 0.4–3.1)

## 2009-06-21 ENCOUNTER — Ambulatory Visit: Payer: Self-pay | Admitting: Internal Medicine

## 2009-06-28 ENCOUNTER — Telehealth: Payer: Self-pay | Admitting: Internal Medicine

## 2009-07-01 ENCOUNTER — Encounter: Payer: Self-pay | Admitting: Internal Medicine

## 2009-07-01 LAB — CBC WITH DIFFERENTIAL (CANCER CENTER ONLY)
BASO%: 0.2 % (ref 0.0–2.0)
LYMPH%: 32.3 % (ref 14.0–48.0)
MCH: 33.4 pg (ref 28.0–33.4)
MCV: 99 fL — ABNORMAL HIGH (ref 82–98)
MONO%: 5.8 % (ref 0.0–13.0)
NEUT#: 2.5 10*3/uL (ref 1.5–6.5)
Platelets: 329 10*3/uL (ref 145–400)
RDW: 11.2 % (ref 10.5–14.6)
WBC: 4.3 10*3/uL (ref 4.0–10.0)

## 2009-07-01 LAB — RETICULOCYTES (CHCC)
ABS Retic: 15.9 10*3/uL — ABNORMAL LOW (ref 19.0–186.0)
RBC.: 3.17 MIL/uL — ABNORMAL LOW (ref 4.22–5.81)

## 2009-07-20 ENCOUNTER — Ambulatory Visit: Payer: Self-pay | Admitting: Hematology & Oncology

## 2009-07-21 ENCOUNTER — Ambulatory Visit: Payer: Self-pay | Admitting: Internal Medicine

## 2009-07-21 LAB — CBC WITH DIFFERENTIAL (CANCER CENTER ONLY)
BASO#: 0 10e3/uL (ref 0.0–0.2)
BASO%: 0.4 % (ref 0.0–2.0)
EOS%: 4 % (ref 0.0–7.0)
Eosinophils Absolute: 0.2 10e3/uL (ref 0.0–0.5)
HCT: 36 % — ABNORMAL LOW (ref 38.7–49.9)
HGB: 12.1 g/dL — ABNORMAL LOW (ref 13.0–17.1)
LYMPH#: 1.5 10e3/uL (ref 0.9–3.3)
LYMPH%: 37.1 % (ref 14.0–48.0)
MCH: 33.6 pg — ABNORMAL HIGH (ref 28.0–33.4)
MCHC: 33.6 g/dL (ref 32.0–35.9)
MCV: 100 fL — ABNORMAL HIGH (ref 82–98)
MONO#: 0.3 10e3/uL (ref 0.1–0.9)
MONO%: 6.6 % (ref 0.0–13.0)
NEUT#: 2.1 10e3/uL (ref 1.5–6.5)
NEUT%: 51.9 % (ref 40.0–80.0)
Platelets: 303 10e3/uL (ref 145–400)
RBC: 3.6 10e6/uL — ABNORMAL LOW (ref 4.20–5.70)
RDW: 12.1 % (ref 10.5–14.6)
WBC: 4.1 10e3/uL (ref 4.0–10.0)

## 2009-07-21 LAB — FECAL OCCULT BLOOD, GUIAC - CHCC SATELLITE: Occult Blood: NEGATIVE

## 2009-08-09 ENCOUNTER — Telehealth: Payer: Self-pay | Admitting: Internal Medicine

## 2009-08-10 LAB — CBC WITH DIFFERENTIAL (CANCER CENTER ONLY)
Eosinophils Absolute: 0.3 10*3/uL (ref 0.0–0.5)
HCT: 33.4 % — ABNORMAL LOW (ref 38.7–49.9)
LYMPH%: 29.5 % (ref 14.0–48.0)
MCH: 33.2 pg (ref 28.0–33.4)
MCV: 98 fL (ref 82–98)
MONO#: 0.2 10*3/uL (ref 0.1–0.9)
MONO%: 4.2 % (ref 0.0–13.0)
NEUT%: 60.7 % (ref 40.0–80.0)
Platelets: 273 10*3/uL (ref 145–400)
RBC: 3.43 10*6/uL — ABNORMAL LOW (ref 4.20–5.70)

## 2009-08-23 ENCOUNTER — Ambulatory Visit: Payer: Self-pay | Admitting: Internal Medicine

## 2009-08-30 ENCOUNTER — Encounter: Payer: Self-pay | Admitting: Internal Medicine

## 2009-09-01 ENCOUNTER — Ambulatory Visit: Payer: Self-pay | Admitting: Hematology & Oncology

## 2009-09-02 ENCOUNTER — Encounter (INDEPENDENT_AMBULATORY_CARE_PROVIDER_SITE_OTHER): Payer: Self-pay | Admitting: *Deleted

## 2009-09-02 LAB — CBC WITH DIFFERENTIAL (CANCER CENTER ONLY)
BASO#: 0 10*3/uL (ref 0.0–0.2)
Eosinophils Absolute: 0.2 10*3/uL (ref 0.0–0.5)
HGB: 11.1 g/dL — ABNORMAL LOW (ref 13.0–17.1)
LYMPH%: 32.1 % (ref 14.0–48.0)
MCH: 32.8 pg (ref 28.0–33.4)
MCV: 96 fL (ref 82–98)
MONO#: 0.3 10*3/uL (ref 0.1–0.9)
MONO%: 6.6 % (ref 0.0–13.0)
RBC: 3.4 10*6/uL — ABNORMAL LOW (ref 4.20–5.70)
WBC: 5.1 10*3/uL (ref 4.0–10.0)

## 2009-09-02 LAB — FERRITIN: Ferritin: 674 ng/mL — ABNORMAL HIGH (ref 22–322)

## 2009-09-23 ENCOUNTER — Ambulatory Visit: Payer: Self-pay | Admitting: Internal Medicine

## 2009-10-04 ENCOUNTER — Ambulatory Visit: Payer: Self-pay | Admitting: Hematology & Oncology

## 2009-10-05 LAB — CBC WITH DIFFERENTIAL (CANCER CENTER ONLY)
BASO#: 0 10*3/uL (ref 0.0–0.2)
BASO%: 0.3 % (ref 0.0–2.0)
EOS%: 4.9 % (ref 0.0–7.0)
HGB: 9.7 g/dL — ABNORMAL LOW (ref 13.0–17.1)
LYMPH#: 1.4 10*3/uL (ref 0.9–3.3)
MCHC: 35.6 g/dL (ref 32.0–35.9)
NEUT#: 2.5 10*3/uL (ref 1.5–6.5)
Platelets: 293 10*3/uL (ref 145–400)
RDW: 12.7 % (ref 10.5–14.6)

## 2009-10-06 ENCOUNTER — Telehealth: Payer: Self-pay | Admitting: Internal Medicine

## 2009-10-11 ENCOUNTER — Ambulatory Visit: Payer: Self-pay | Admitting: Internal Medicine

## 2009-10-11 LAB — CONVERTED CEMR LAB
AST: 18 units/L (ref 0–37)
Albumin: 3.9 g/dL (ref 3.5–5.2)
Alkaline Phosphatase: 42 units/L (ref 39–117)
Basophils Absolute: 0 10*3/uL (ref 0.0–0.1)
Basophils Relative: 0.7 % (ref 0.0–3.0)
CO2: 26 meq/L (ref 19–32)
GFR calc non Af Amer: 41.96 mL/min (ref >60)
Glucose, Bld: 95 mg/dL (ref 70–99)
Glucose, Urine, Semiquant: NEGATIVE
HCT: 29.5 % — ABNORMAL LOW (ref 39.0–52.0)
Hemoglobin: 9.9 g/dL — ABNORMAL LOW (ref 13.0–17.0)
Lymphocytes Relative: 31.1 % (ref 12.0–46.0)
Lymphs Abs: 1.6 10*3/uL (ref 0.7–4.0)
MCHC: 33.5 g/dL (ref 30.0–36.0)
Monocytes Relative: 9.1 % (ref 3.0–12.0)
Neutro Abs: 3 10*3/uL (ref 1.4–7.7)
Nitrite: NEGATIVE
Potassium: 4.7 meq/L (ref 3.5–5.1)
RBC: 2.91 M/uL — ABNORMAL LOW (ref 4.22–5.81)
RDW: 14.5 % (ref 11.5–14.6)
Sodium: 144 meq/L (ref 135–145)
Specific Gravity, Urine: 1.015
TSH: 2.21 microintl units/mL (ref 0.35–5.50)
Total CHOL/HDL Ratio: 2
Total Protein: 6.1 g/dL (ref 6.0–8.3)
WBC Urine, dipstick: NEGATIVE
pH: 7

## 2009-10-18 ENCOUNTER — Ambulatory Visit: Payer: Self-pay | Admitting: Internal Medicine

## 2009-11-08 ENCOUNTER — Ambulatory Visit: Payer: Self-pay | Admitting: Hematology & Oncology

## 2009-11-09 LAB — CBC WITH DIFFERENTIAL (CANCER CENTER ONLY)
BASO%: 0.2 % (ref 0.0–2.0)
EOS%: 5.4 % (ref 0.0–7.0)
HCT: 28.6 % — ABNORMAL LOW (ref 38.7–49.9)
LYMPH#: 1.6 10*3/uL (ref 0.9–3.3)
MCHC: 34.7 g/dL (ref 32.0–35.9)
NEUT%: 53.5 % (ref 40.0–80.0)
RDW: 13.6 % (ref 10.5–14.6)

## 2009-11-22 ENCOUNTER — Ambulatory Visit: Payer: Self-pay | Admitting: Internal Medicine

## 2009-12-07 ENCOUNTER — Encounter: Payer: Self-pay | Admitting: Internal Medicine

## 2009-12-07 LAB — CBC WITH DIFFERENTIAL (CANCER CENTER ONLY)
BASO#: 0 10*3/uL (ref 0.0–0.2)
EOS%: 3.9 % (ref 0.0–7.0)
HCT: 35.4 % — ABNORMAL LOW (ref 38.7–49.9)
HGB: 12 g/dL — ABNORMAL LOW (ref 13.0–17.1)
LYMPH#: 1.3 10*3/uL (ref 0.9–3.3)
LYMPH%: 42.3 % (ref 14.0–48.0)
MCH: 33.8 pg — ABNORMAL HIGH (ref 28.0–33.4)
MCHC: 33.8 g/dL (ref 32.0–35.9)
MCV: 100 fL — ABNORMAL HIGH (ref 82–98)
MONO%: 5.7 % (ref 0.0–13.0)
NEUT%: 47.7 % (ref 40.0–80.0)

## 2009-12-08 ENCOUNTER — Ambulatory Visit: Payer: Self-pay | Admitting: Internal Medicine

## 2009-12-16 ENCOUNTER — Ambulatory Visit: Payer: Self-pay | Admitting: Internal Medicine

## 2009-12-23 ENCOUNTER — Ambulatory Visit: Payer: Self-pay | Admitting: Internal Medicine

## 2010-01-17 ENCOUNTER — Ambulatory Visit: Payer: Self-pay | Admitting: Hematology & Oncology

## 2010-01-18 LAB — CBC WITH DIFFERENTIAL (CANCER CENTER ONLY)
Eosinophils Absolute: 0.2 10*3/uL (ref 0.0–0.5)
HGB: 10.5 g/dL — ABNORMAL LOW (ref 13.0–17.1)
LYMPH#: 1.4 10*3/uL (ref 0.9–3.3)
MCH: 32.6 pg (ref 28.0–33.4)
MONO#: 0.3 10*3/uL (ref 0.1–0.9)
MONO%: 7 % (ref 0.0–13.0)
NEUT#: 1.8 10*3/uL (ref 1.5–6.5)
Platelets: 247 10*3/uL (ref 145–400)
RBC: 3.22 10*6/uL — ABNORMAL LOW (ref 4.20–5.70)
WBC: 3.7 10*3/uL — ABNORMAL LOW (ref 4.0–10.0)

## 2010-01-20 ENCOUNTER — Ambulatory Visit: Payer: Self-pay | Admitting: Internal Medicine

## 2010-01-31 ENCOUNTER — Telehealth: Payer: Self-pay | Admitting: Internal Medicine

## 2010-02-20 ENCOUNTER — Ambulatory Visit: Payer: Self-pay | Admitting: Hematology & Oncology

## 2010-02-21 ENCOUNTER — Ambulatory Visit: Payer: Self-pay | Admitting: Internal Medicine

## 2010-02-22 ENCOUNTER — Encounter: Payer: Self-pay | Admitting: Internal Medicine

## 2010-02-22 LAB — CBC WITH DIFFERENTIAL (CANCER CENTER ONLY)
BASO%: 0.2 % (ref 0.0–2.0)
EOS%: 4.1 % (ref 0.0–7.0)
LYMPH#: 1.3 10*3/uL (ref 0.9–3.3)
MCHC: 33.4 g/dL (ref 32.0–35.9)
MONO#: 0.2 10*3/uL (ref 0.1–0.9)
NEUT#: 2.1 10*3/uL (ref 1.5–6.5)
Platelets: 280 10*3/uL (ref 145–400)
RDW: 12.3 % (ref 10.5–14.6)
WBC: 3.8 10*3/uL — ABNORMAL LOW (ref 4.0–10.0)

## 2010-02-22 LAB — FERRITIN: Ferritin: 909 ng/mL — ABNORMAL HIGH (ref 22–322)

## 2010-03-22 ENCOUNTER — Telehealth: Payer: Self-pay | Admitting: Internal Medicine

## 2010-03-22 ENCOUNTER — Ambulatory Visit: Payer: Self-pay | Admitting: Internal Medicine

## 2010-04-04 ENCOUNTER — Telehealth: Payer: Self-pay | Admitting: Internal Medicine

## 2010-04-07 ENCOUNTER — Ambulatory Visit: Payer: Self-pay | Admitting: Internal Medicine

## 2010-04-07 LAB — CONVERTED CEMR LAB
Alkaline Phosphatase: 39 units/L (ref 39–117)
BUN: 48 mg/dL — ABNORMAL HIGH (ref 6–23)
Basophils Absolute: 0 10*3/uL (ref 0.0–0.1)
Bilirubin, Direct: 0.1 mg/dL (ref 0.0–0.3)
CO2: 27 meq/L (ref 19–32)
Chloride: 109 meq/L (ref 96–112)
Cholesterol: 127 mg/dL (ref 0–200)
GFR calc non Af Amer: 35.15 mL/min (ref >60)
Glucose, Bld: 136 mg/dL — ABNORMAL HIGH (ref 70–99)
HCT: 28.3 % — ABNORMAL LOW (ref 39.0–52.0)
Hemoglobin: 9.9 g/dL — ABNORMAL LOW (ref 13.0–17.0)
LDL Cholesterol: 49 mg/dL (ref 0–99)
Lymphs Abs: 1.5 10*3/uL (ref 0.7–4.0)
MCHC: 34.9 g/dL (ref 30.0–36.0)
MCV: 101.1 fL — ABNORMAL HIGH (ref 78.0–100.0)
Monocytes Absolute: 0.3 10*3/uL (ref 0.1–1.0)
Monocytes Relative: 7.2 % (ref 3.0–12.0)
Neutro Abs: 2.5 10*3/uL (ref 1.4–7.7)
Platelets: 236 10*3/uL (ref 150.0–400.0)
Potassium: 5.3 meq/L — ABNORMAL HIGH (ref 3.5–5.1)
RDW: 14.2 % (ref 11.5–14.6)
Sodium: 140 meq/L (ref 135–145)
Total Bilirubin: 0.6 mg/dL (ref 0.3–1.2)
Total CHOL/HDL Ratio: 2
Total Protein: 5.6 g/dL — ABNORMAL LOW (ref 6.0–8.3)
VLDL: 7.6 mg/dL (ref 0.0–40.0)

## 2010-04-14 ENCOUNTER — Ambulatory Visit: Payer: Self-pay | Admitting: Internal Medicine

## 2010-04-14 ENCOUNTER — Telehealth: Payer: Self-pay | Admitting: Internal Medicine

## 2010-04-24 ENCOUNTER — Ambulatory Visit: Payer: Self-pay | Admitting: Hematology & Oncology

## 2010-04-26 ENCOUNTER — Encounter: Payer: Self-pay | Admitting: Internal Medicine

## 2010-04-26 LAB — CBC WITH DIFFERENTIAL (CANCER CENTER ONLY)
BASO%: 0.3 % (ref 0.0–2.0)
Eosinophils Absolute: 0.2 10*3/uL (ref 0.0–0.5)
LYMPH%: 40.2 % (ref 14.0–48.0)
MCH: 33.9 pg — ABNORMAL HIGH (ref 28.0–33.4)
MCV: 99 fL — ABNORMAL HIGH (ref 82–98)
MONO%: 5.9 % (ref 0.0–13.0)
NEUT#: 2.2 10*3/uL (ref 1.5–6.5)
Platelets: 263 10*3/uL (ref 145–400)
RBC: 2.96 10*6/uL — ABNORMAL LOW (ref 4.20–5.70)
RDW: 10.7 % (ref 10.5–14.6)
WBC: 4.4 10*3/uL (ref 4.0–10.0)

## 2010-04-26 LAB — FERRITIN: Ferritin: 1086 ng/mL — ABNORMAL HIGH (ref 22–322)

## 2010-05-26 ENCOUNTER — Ambulatory Visit: Payer: Self-pay | Admitting: Internal Medicine

## 2010-05-29 LAB — CONVERTED CEMR LAB
Basophils Absolute: 0 10*3/uL (ref 0.0–0.1)
CO2: 24 meq/L (ref 19–32)
Calcium: 9 mg/dL (ref 8.4–10.5)
Creatinine, Ser: 2 mg/dL — ABNORMAL HIGH (ref 0.4–1.5)
Eosinophils Absolute: 0.2 10*3/uL (ref 0.0–0.7)
GFR calc non Af Amer: 35.34 mL/min (ref >60)
Glucose, Bld: 89 mg/dL (ref 70–99)
Lymphocytes Relative: 33.2 % (ref 12.0–46.0)
MCHC: 34.2 g/dL (ref 30.0–36.0)
Monocytes Relative: 8.8 % (ref 3.0–12.0)
Neutrophils Relative %: 53.1 % (ref 43.0–77.0)
RBC: 2.93 M/uL — ABNORMAL LOW (ref 4.22–5.81)
RDW: 14.9 % — ABNORMAL HIGH (ref 11.5–14.6)
Sodium: 144 meq/L (ref 135–145)

## 2010-05-30 ENCOUNTER — Telehealth: Payer: Self-pay | Admitting: Internal Medicine

## 2010-06-01 ENCOUNTER — Ambulatory Visit: Payer: Self-pay | Admitting: Hematology & Oncology

## 2010-06-14 ENCOUNTER — Encounter: Payer: Self-pay | Admitting: Internal Medicine

## 2010-06-14 LAB — RETICULOCYTES (CHCC)
ABS Retic: 13.1 10*3/uL — ABNORMAL LOW (ref 19.0–186.0)
RBC.: 2.61 MIL/uL — ABNORMAL LOW (ref 4.22–5.81)

## 2010-06-14 LAB — CBC WITH DIFFERENTIAL (CANCER CENTER ONLY)
BASO#: 0 10*3/uL (ref 0.0–0.2)
BASO%: 0.2 % (ref 0.0–2.0)
EOS%: 4.6 % (ref 0.0–7.0)
HCT: 25.7 % — ABNORMAL LOW (ref 38.7–49.9)
HGB: 8.8 g/dL — ABNORMAL LOW (ref 13.0–17.1)
LYMPH%: 25.8 % (ref 14.0–48.0)
MCHC: 34.1 g/dL (ref 32.0–35.9)
MCV: 101 fL — ABNORMAL HIGH (ref 82–98)
NEUT%: 63.5 % (ref 40.0–80.0)
RDW: 11.5 % (ref 10.5–14.6)

## 2010-06-14 LAB — IRON AND TIBC
Iron: 143 ug/dL (ref 42–165)
TIBC: 218 ug/dL (ref 215–435)
UIBC: 75 ug/dL

## 2010-06-22 ENCOUNTER — Telehealth: Payer: Self-pay | Admitting: Internal Medicine

## 2010-06-27 ENCOUNTER — Ambulatory Visit: Payer: Self-pay | Admitting: Internal Medicine

## 2010-07-06 ENCOUNTER — Ambulatory Visit: Payer: Self-pay | Admitting: Hematology & Oncology

## 2010-07-07 ENCOUNTER — Encounter: Payer: Self-pay | Admitting: Internal Medicine

## 2010-07-07 LAB — CBC WITH DIFFERENTIAL (CANCER CENTER ONLY)
BASO#: 0 10*3/uL (ref 0.0–0.2)
EOS%: 3.6 % (ref 0.0–7.0)
LYMPH%: 31.5 % (ref 14.0–48.0)
MCH: 35.1 pg — ABNORMAL HIGH (ref 28.0–33.4)
MCHC: 34.5 g/dL (ref 32.0–35.9)
MCV: 102 fL — ABNORMAL HIGH (ref 82–98)
MONO%: 7.4 % (ref 0.0–13.0)
NEUT#: 1.8 10*3/uL (ref 1.5–6.5)
Platelets: 305 10*3/uL (ref 145–400)

## 2010-07-07 LAB — FECAL OCCULT BLOOD, GUIAC - CHCC SATELLITE: Occult Blood: NEGATIVE

## 2010-07-07 LAB — RETICULOCYTES (CHCC): Retic Ct Pct: 0.9 % (ref 0.4–3.1)

## 2010-07-07 LAB — CHCC SATELLITE - SMEAR

## 2010-07-26 LAB — CBC WITH DIFFERENTIAL (CANCER CENTER ONLY)
BASO#: 0 10*3/uL (ref 0.0–0.2)
BASO%: 0.5 % (ref 0.0–2.0)
HCT: 32 % — ABNORMAL LOW (ref 38.7–49.9)
HGB: 10.8 g/dL — ABNORMAL LOW (ref 13.0–17.1)
LYMPH%: 31.1 % (ref 14.0–48.0)
MCHC: 33.7 g/dL (ref 32.0–35.9)
MCV: 101 fL — ABNORMAL HIGH (ref 82–98)
MONO#: 0.3 10*3/uL (ref 0.1–0.9)
NEUT%: 58.4 % (ref 40.0–80.0)
RDW: 11 % (ref 10.5–14.6)
WBC: 3.8 10*3/uL — ABNORMAL LOW (ref 4.0–10.0)

## 2010-07-28 ENCOUNTER — Ambulatory Visit: Payer: Self-pay | Admitting: Internal Medicine

## 2010-08-10 ENCOUNTER — Ambulatory Visit: Payer: Self-pay | Admitting: Internal Medicine

## 2010-08-10 DIAGNOSIS — N259 Disorder resulting from impaired renal tubular function, unspecified: Secondary | ICD-10-CM | POA: Insufficient documentation

## 2010-08-15 ENCOUNTER — Ambulatory Visit: Payer: Self-pay | Admitting: Hematology & Oncology

## 2010-08-16 ENCOUNTER — Encounter: Payer: Self-pay | Admitting: Internal Medicine

## 2010-08-16 LAB — CBC WITH DIFFERENTIAL (CANCER CENTER ONLY)
BASO#: 0 10*3/uL (ref 0.0–0.2)
Eosinophils Absolute: 0.1 10*3/uL (ref 0.0–0.5)
HCT: 32 % — ABNORMAL LOW (ref 38.7–49.9)
HGB: 11 g/dL — ABNORMAL LOW (ref 13.0–17.1)
LYMPH%: 31.4 % (ref 14.0–48.0)
MCH: 34.1 pg — ABNORMAL HIGH (ref 28.0–33.4)
MCV: 99 fL — ABNORMAL HIGH (ref 82–98)
MONO%: 9.1 % (ref 0.0–13.0)
NEUT%: 55 % (ref 40.0–80.0)
Platelets: 310 10*3/uL (ref 145–400)
RBC: 3.22 10*6/uL — ABNORMAL LOW (ref 4.20–5.70)

## 2010-08-16 LAB — IRON AND TIBC
%SAT: 39 % (ref 20–55)
Iron: 99 ug/dL (ref 42–165)
TIBC: 253 ug/dL (ref 215–435)

## 2010-08-16 LAB — FERRITIN: Ferritin: 590 ng/mL — ABNORMAL HIGH (ref 22–322)

## 2010-08-22 LAB — CONVERTED CEMR LAB
CO2: 26 meq/L (ref 19–32)
Chloride: 94 meq/L — ABNORMAL LOW (ref 96–112)
Potassium: 4.6 meq/L (ref 3.5–5.1)

## 2010-08-24 ENCOUNTER — Ambulatory Visit: Payer: Self-pay | Admitting: Internal Medicine

## 2010-08-28 LAB — CONVERTED CEMR LAB
CO2: 27 meq/L (ref 19–32)
Chloride: 99 meq/L (ref 96–112)
Glucose, Bld: 94 mg/dL (ref 70–99)
Potassium: 4.2 meq/L (ref 3.5–5.1)
Sodium: 133 meq/L — ABNORMAL LOW (ref 135–145)

## 2010-09-13 ENCOUNTER — Telehealth: Payer: Self-pay | Admitting: Internal Medicine

## 2010-09-14 ENCOUNTER — Ambulatory Visit: Payer: Self-pay | Admitting: Hematology & Oncology

## 2010-09-14 LAB — CBC WITH DIFFERENTIAL (CANCER CENTER ONLY)
Eosinophils Absolute: 0.3 10*3/uL (ref 0.0–0.5)
LYMPH%: 34.7 % (ref 14.0–48.0)
MCH: 33.8 pg — ABNORMAL HIGH (ref 28.0–33.4)
MCV: 100 fL — ABNORMAL HIGH (ref 82–98)
MONO%: 7.9 % (ref 0.0–13.0)
Platelets: 319 10*3/uL (ref 145–400)
RBC: 3.04 10*6/uL — ABNORMAL LOW (ref 4.20–5.70)

## 2010-09-22 ENCOUNTER — Ambulatory Visit: Payer: Self-pay | Admitting: Internal Medicine

## 2010-10-17 ENCOUNTER — Ambulatory Visit: Payer: Self-pay | Admitting: Hematology & Oncology

## 2010-10-18 ENCOUNTER — Encounter: Payer: Self-pay | Admitting: Internal Medicine

## 2010-10-18 LAB — CBC WITH DIFFERENTIAL (CANCER CENTER ONLY)
BASO#: 0 10*3/uL (ref 0.0–0.2)
Eosinophils Absolute: 0.1 10*3/uL (ref 0.0–0.5)
HGB: 11 g/dL — ABNORMAL LOW (ref 13.0–17.1)
LYMPH%: 33.1 % (ref 14.0–48.0)
MCV: 99 fL — ABNORMAL HIGH (ref 82–98)
MONO#: 0.3 10*3/uL (ref 0.1–0.9)
NEUT#: 2.2 10*3/uL (ref 1.5–6.5)
Platelets: 325 10*3/uL (ref 145–400)
RBC: 3.24 10*6/uL — ABNORMAL LOW (ref 4.20–5.70)
WBC: 3.9 10*3/uL — ABNORMAL LOW (ref 4.0–10.0)

## 2010-10-24 ENCOUNTER — Ambulatory Visit: Payer: Self-pay | Admitting: Internal Medicine

## 2010-11-23 ENCOUNTER — Ambulatory Visit
Admission: RE | Admit: 2010-11-23 | Discharge: 2010-11-23 | Payer: Self-pay | Source: Home / Self Care | Attending: Internal Medicine | Admitting: Internal Medicine

## 2010-11-23 ENCOUNTER — Ambulatory Visit: Payer: Self-pay | Admitting: Hematology & Oncology

## 2010-11-24 LAB — CBC WITH DIFFERENTIAL (CANCER CENTER ONLY)
BASO#: 0 10*3/uL (ref 0.0–0.2)
BASO%: 0.4 % (ref 0.0–2.0)
EOS%: 4.6 % (ref 0.0–7.0)
Eosinophils Absolute: 0.2 10*3/uL (ref 0.0–0.5)
HCT: 33.3 % — ABNORMAL LOW (ref 38.7–49.9)
HGB: 11.2 g/dL — ABNORMAL LOW (ref 13.0–17.1)
LYMPH#: 1.5 10*3/uL (ref 0.9–3.3)
LYMPH%: 34.3 % (ref 14.0–48.0)
MCH: 33.5 pg — ABNORMAL HIGH (ref 28.0–33.4)
MCHC: 33.7 g/dL (ref 32.0–35.9)
MCV: 100 fL — ABNORMAL HIGH (ref 82–98)
MONO#: 0.3 10*3/uL (ref 0.1–0.9)
MONO%: 6.3 % (ref 0.0–13.0)
NEUT#: 2.5 10*3/uL (ref 1.5–6.5)
NEUT%: 54.4 % (ref 40.0–80.0)
Platelets: 338 10*3/uL (ref 145–400)
RBC: 3.34 10*6/uL — ABNORMAL LOW (ref 4.20–5.70)
RDW: 11.4 % (ref 10.5–14.6)
WBC: 4.5 10*3/uL (ref 4.0–10.0)

## 2010-11-24 LAB — RETICULOCYTES (CHCC)
ABS Retic: 26.6 10*3/uL (ref 19.0–186.0)
RBC.: 3.33 MIL/uL — ABNORMAL LOW (ref 4.22–5.81)
Retic Ct Pct: 0.8 % (ref 0.4–3.1)

## 2010-11-24 LAB — FERRITIN: Ferritin: 722 ng/mL — ABNORMAL HIGH (ref 22–322)

## 2010-11-24 LAB — CHCC SATELLITE - SMEAR

## 2010-11-28 ENCOUNTER — Other Ambulatory Visit: Payer: Self-pay | Admitting: Internal Medicine

## 2010-11-28 ENCOUNTER — Ambulatory Visit
Admission: RE | Admit: 2010-11-28 | Discharge: 2010-11-28 | Payer: Self-pay | Source: Home / Self Care | Attending: Internal Medicine | Admitting: Internal Medicine

## 2010-11-28 LAB — CBC WITH DIFFERENTIAL/PLATELET
Basophils Absolute: 0 10*3/uL (ref 0.0–0.1)
Basophils Relative: 0.4 % (ref 0.0–3.0)
Eosinophils Absolute: 0.1 10*3/uL (ref 0.0–0.7)
Eosinophils Relative: 2 % (ref 0.0–5.0)
HCT: 28.6 % — ABNORMAL LOW (ref 39.0–52.0)
Hemoglobin: 10 g/dL — ABNORMAL LOW (ref 13.0–17.0)
Lymphocytes Relative: 25.2 % (ref 12.0–46.0)
Lymphs Abs: 1 10*3/uL (ref 0.7–4.0)
MCHC: 34.9 g/dL (ref 30.0–36.0)
MCV: 101.5 fl — ABNORMAL HIGH (ref 78.0–100.0)
Monocytes Absolute: 0.4 10*3/uL (ref 0.1–1.0)
Monocytes Relative: 9 % (ref 3.0–12.0)
Neutro Abs: 2.5 10*3/uL (ref 1.4–7.7)
Neutrophils Relative %: 63.4 % (ref 43.0–77.0)
Platelets: 286 10*3/uL (ref 150.0–400.0)
RBC: 2.82 Mil/uL — ABNORMAL LOW (ref 4.22–5.81)
RDW: 13.8 % (ref 11.5–14.6)
WBC: 3.9 10*3/uL — ABNORMAL LOW (ref 4.5–10.5)

## 2010-11-28 LAB — TSH: TSH: 2.89 u[IU]/mL (ref 0.35–5.50)

## 2010-11-28 LAB — HEPATIC FUNCTION PANEL
ALT: 17 U/L (ref 0–53)
AST: 21 U/L (ref 0–37)
Albumin: 3.8 g/dL (ref 3.5–5.2)
Alkaline Phosphatase: 45 U/L (ref 39–117)
Bilirubin, Direct: 0.1 mg/dL (ref 0.0–0.3)
Total Bilirubin: 0.8 mg/dL (ref 0.3–1.2)
Total Protein: 5.9 g/dL — ABNORMAL LOW (ref 6.0–8.3)

## 2010-11-28 LAB — LIPID PANEL
Cholesterol: 136 mg/dL (ref 0–200)
HDL: 82.7 mg/dL (ref >39.00)
LDL Cholesterol: 46 mg/dL (ref 0–99)
Total CHOL/HDL Ratio: 2
Triglycerides: 36 mg/dL (ref 0.0–149.0)
VLDL: 7.2 mg/dL (ref 0.0–40.0)

## 2010-11-28 LAB — BASIC METABOLIC PANEL
BUN: 20 mg/dL (ref 6–23)
CO2: 28 mEq/L (ref 19–32)
Calcium: 8.8 mg/dL (ref 8.4–10.5)
Chloride: 108 mEq/L (ref 96–112)
Creatinine, Ser: 1.2 mg/dL (ref 0.4–1.5)
GFR: 62.53 mL/min (ref >60.00)
Glucose, Bld: 89 mg/dL (ref 70–99)
Potassium: 5 mEq/L (ref 3.5–5.1)
Sodium: 141 mEq/L (ref 135–145)

## 2010-11-29 ENCOUNTER — Telehealth: Payer: Self-pay | Admitting: Internal Medicine

## 2010-12-14 ENCOUNTER — Telehealth: Payer: Self-pay | Admitting: Internal Medicine

## 2010-12-19 NOTE — Assessment & Plan Note (Signed)
Summary: b-12 inj/cjr   Nurse Visit   Allergies: No Known Drug Allergies  Medication Administration  Injection # 1:    Medication: Vit B12 1000 mcg    Diagnosis: ANEMIA, B12 DEFICIENCY (ICD-281.1)    Route: IM    Site: R deltoid    Exp Date: 06/30/2009    Lot #: [id number]    Mfr: American Regent    Patient tolerated injection without complications    Given by: Kern Reap CMA (AAMA) (November 22, 2009 1:50 PM)  Orders Added: 1)  Admin of Therapeutic Inj  intramuscular or subcutaneous [09811]

## 2010-12-19 NOTE — Assessment & Plan Note (Signed)
Summary: 4 MO ROV/MM   Vital Signs:  Patient profile:   77 year old male Pulse rate:   70 / minute Pulse rhythm:   regular Resp:     16 per minute BP sitting:   142 / 70  History of Present Illness:  Follow-Up Visit      This is a 77 year old man who presents for Follow-up visit.  The patient denies chest pain and palpitations.  Since the last visit the patient notes no new problems or concerns except he admits to chtonic back pain---low back, no weakness. ongoing for years. .  The patient reports taking meds as prescribed.  When questioned about possible medication side effects, the patient notes none.    Current Problems (verified): 1)  Preventive Health Care  (ICD-V70.0) 2)  Anemia, B12 Deficiency  (ICD-281.1) 3)  Colonic Polyps, Hx of  (ICD-V12.72) 4)  Benign Prostatic Hypertrophy  (ICD-600.00) 5)  Hypertension  (ICD-401.9) 6)  Hyperlipidemia  (ICD-272.4) 7)  Gerd  (ICD-530.81) 8)  Coronary Artery Disease  (ICD-414.00)  Current Medications (verified): 1)  Alphagan P 0.1 % Soln (Brimonidine Tartrate) .... Apply Twice A Day 2)  Lisinopril-Hydrochlorothiazide 20-25 Mg  Tabs (Lisinopril-Hydrochlorothiazide) .... Take 1 Tab By Mouth Daily 3)  Aspir-81 81 Mg Tbec (Aspirin) .... Take 2 Tablet By Mouth Once A Day 4)  Cyanocobalamin 1000 Mcg/ml Soln (Cyanocobalamin) .... Inject 1cc Every Month 5)  Oxybutynin Chloride 5 Mg Tb24 (Oxybutynin Chloride) .... Take 1 Tablet By Mouth Once A Day 6)  Doxazosin Mesylate 8 Mg Tabs (Doxazosin Mesylate) .... Take 1 Tablet By Mouth At Bedtime 7)  Nitroglycerin 0.4 Mg Subl (Nitroglycerin) .... Place 1 Tablet Under Tongue As Directed 8)  Simvastatin 40 Mg Tabs (Simvastatin) .... Take 1 Tablet By Mouth At Bedtime 9)  Isosorbide Mononitrate Cr 30 Mg Xr24h-Tab (Isosorbide Mononitrate) .... 1/2 Daily 10)  Omeprazole 20 Mg  Cpdr (Omeprazole) .... Take 1 Tablet By Mouth Two Times A Day 11)  Iron 325 (65 Fe) Mg Tabs (Ferrous Sulfate) .... Once  Daily  Allergies (verified): No Known Drug Allergies  Past History:  Past Medical History: Last updated: 05/19/2007 Coronary artery disease GERD Hyperlipidemia Hypertension Benign prostatic hypertrophy carotid vascular disease Colonic polyps, hx of B12 deficiency  Past Surgical History: Last updated: 09/29/2006 Carotid endarterectomy PTCA/stent quadriceps muscle attachment  Family History: Last updated: October 09, 2006 father--deceased etoh/tobacco 69yo mother--deceased COPD 64yo  Social History: Last updated: 07/11/2007 Retired Married wife with new liver transplant  Risk Factors: Smoking Status: quit (12/16/2009)  Review of Systems       All other systems reviewed and were negative   Physical Exam  General:  alert and well-developed.   Head:  normocephalic and atraumatic.   Eyes:  pupils equal and pupils round.   Ears:  R ear normal and L ear normal.   Neck:  No deformities, masses, or tenderness noted. Chest Wall:  No deformities, masses, tenderness or gynecomastia noted. Lungs:  normal respiratory effort and no intercostal retractions.   Heart:  normal rate and regular rhythm.   Abdomen:  soft and non-tender.   Msk:  No deformity or scoliosis noted of thoracic or lumbar spine.   Pulses:  R radial normal and L radial normal.   Neurologic:  cranial nerves II-XII intact and gait normal.   Skin:  turgor normal and color normal.   Psych:  memory intact for recent and remote and normally interactive.     Impression & Recommendations:  Problem # 1:  ANEMIA, B12 DEFICIENCY (ICD-281.1) has f/u with hematology His updated medication list for this problem includes:    Cyanocobalamin 1000 Mcg/ml Soln (Cyanocobalamin) ..... Inject 1cc every month    Iron 325 (65 Fe) Mg Tabs (Ferrous sulfate) ..... Once daily  Problem # 2:  HYPERTENSION (ICD-401.9) reasonable control continue current medications  note potassium will recheck in one month may need to stop  ACE_I His updated medication list for this problem includes:    Lisinopril-hydrochlorothiazide 20-25 Mg Tabs (Lisinopril-hydrochlorothiazide) .[name]... Take 1 tab by mouth daily    Doxazosin Mesylate 8 Mg Tabs (Doxazosin mesylate) .[name]... Take 1 tablet by mouth at bedtime  BP today: 128/70 Prior BP: 126/68 (12/16/2009)  Prior 10 Yr Risk Heart Disease: N/A (09/30/2006)  Labs Reviewed: K+: 5.3 (04/07/2010) Creat: : 2.0 (04/07/2010)   Chol: 127 (04/07/2010)   HDL: 70.00 (04/07/2010)   LDL: 49 (04/07/2010)   TG: 38.0 (04/07/2010)  Problem # 3:  HYPERLIPIDEMIA (ICD-272.4) controlled continue current medications  His updated medication list for this problem includes:    Simvastatin 40 Mg Tabs (Simvastatin) .[name]... Take 1 tablet by mouth at bedtime  Labs Reviewed: SGOT: 21 (04/07/2010)   SGPT: 19 (04/07/2010)  Lipid Goals: Chol Goal: 200 (09/30/2006)   HDL Goal: 40 (09/30/2006)   LDL Goal: 100 (09/30/2006)   TG Goal: 150 (09/30/2006)  Prior 10 Yr Risk Heart Disease: N/A (09/30/2006)   HDL:70.00 (04/07/2010), 60.40 (10/11/2009)  LDL:49 (04/07/2010), 41 (10/11/2009)  Chol:127 (04/07/2010), 116 (10/11/2009)  Trig:38.0 (04/07/2010), 73.0 (10/11/2009)  Problem # 4:  GERD (ICD-530.81) no sxs  His updated medication list for this problem includes:    Omeprazole 20 Mg Cpdr (Omeprazole) .[name]... Take 1 tablet by mouth two times a day  Labs Reviewed: Hgb: 9.9 (04/07/2010)   Hct: 28.3 (04/07/2010) chronic back pain handicapped sticker form completed  Complete Medication List: 1)  Alphagan P 0.1 % Soln (Brimonidine tartrate) .... Apply twice a day 2)  Lisinopril-hydrochlorothiazide 20-25 Mg Tabs (Lisinopril-hydrochlorothiazide) .... Take 1 tab by mouth daily 3)  Aspir-81 81 Mg Tbec (Aspirin) .... Take 2 tablet by mouth once a day 4)  Cyanocobalamin 1000 Mcg/ml Soln (Cyanocobalamin) .... Inject 1cc every month 5)  Oxybutynin Chloride 5 Mg Tb24 (Oxybutynin chloride) .... Take 1 tablet by mouth once a  day 6)  Doxazosin Mesylate 8 Mg Tabs (Doxazosin mesylate) .... Take 1 tablet by mouth at bedtime 7)  Nitroglycerin 0.4 Mg Subl (Nitroglycerin) .... Place 1 tablet under tongue as directed 8)  Simvastatin 40 Mg Tabs (Simvastatin) .... Take 1 tablet by mouth at bedtime 9)  Isosorbide Mononitrate Cr 30 Mg Xr24h-tab (Isosorbide mononitrate) .... 1/2 daily 10)  Omeprazole 20 Mg Cpdr (Omeprazole) .... Take 1 tablet by mouth two times a day 11)  Iron 325 (65 Fe) Mg Tabs (Ferrous sulfate) .... Once daily  Patient Instructions: 1)  lab work 4-6 weeks from now 2)  coordinate with B12 shot 3)  cbc diff 4)  bmet--995.2 5)  no office visit 6)  See me 4 months.   Appended Document: 4 MO ROV/MM   Medication Administration  Injection # 1:    Medication: Vit B12 1000 mcg    Diagnosis: ANEMIA, B12 DEFICIENCY (ICD-281.1)    Route: IM    Site: R deltoid    Exp Date: 07/21/2011    Lot #: [id number]    Mfr: American Regent    Patient tolerated injection without complications    Given by: Gladis Riffle, RN (Apr 14, 2010 9:31 AM)  Orders  Added: 1)  Vit B12 1000 mcg [J3420] 2)  Admin of Therapeutic Inj  intramuscular or subcutaneous [28413]

## 2010-12-19 NOTE — Assessment & Plan Note (Signed)
Summary: 4 month rov/njr/pt rescd from [facility]//ccm   Vital Signs:  Patient profile:   77 year old male Weight:      166 pounds Temp:     98.2 degrees F oral Pulse rate:   78 / minute BP sitting:   120 / 80  (left arm) Cuff size:   regular  Vitals Entered By: Romualdo Bolk, CMA (AAMA) (August 10, 2010 9:27 AM) CC: follow-up visit   CC:  follow-up visit.  History of Present Illness:  Follow-Up Visit      This is a 77 year old man who presents for Follow-up visit.  The patient denies chest pain and palpitations.  Since the last visit the patient notes no new problems or concerns.  The patient reports taking meds as prescribed---he thinks he is taking lisinopril too---reviewed records---stopped due to hyperkalemia.  When questioned about possible medication side effects, the patient notes none.    All other systems reviewed and were negative   Current Problems (verified): 1)  Preventive Health Care  (ICD-V70.0) 2)  Anemia, B12 Deficiency  (ICD-281.1) 3)  Colonic Polyps, Hx of  (ICD-V12.72) 4)  Benign Prostatic Hypertrophy  (ICD-600.00) 5)  Hypertension  (ICD-401.9) 6)  Hyperlipidemia  (ICD-272.4) 7)  Gerd  (ICD-530.81) 8)  Coronary Artery Disease  (ICD-414.00)  Current Medications (verified): 1)  Alphagan P 0.1 % Soln (Brimonidine Tartrate) .... Apply Twice A Day 2)  Aspir-81 81 Mg Tbec (Aspirin) .... Take 2 Tablet By Mouth Once A Day 3)  Cyanocobalamin 1000 Mcg/ml Soln (Cyanocobalamin) .... Inject 1cc Every Month 4)  Oxybutynin Chloride 5 Mg Tb24 (Oxybutynin Chloride) .... Take 1 Tablet By Mouth Once A Day 5)  Doxazosin Mesylate 8 Mg Tabs (Doxazosin Mesylate) .... Take 1 Tablet By Mouth At Bedtime 6)  Nitroglycerin 0.4 Mg Subl (Nitroglycerin) .... Place 1 Tablet Under Tongue As Directed 7)  Simvastatin 40 Mg Tabs (Simvastatin) .... Take 1 Tablet By Mouth At Bedtime 8)  Isosorbide Mononitrate Cr 30 Mg Xr24h-Tab (Isosorbide Mononitrate) .... 1/2 Daily 9)  Omeprazole 20 Mg   Cpdr (Omeprazole) .... Take 1 Tablet By Mouth Two Times A Day 10)  Iron 325 (65 Fe) Mg Tabs (Ferrous Sulfate) .... Once Daily 11)  Hydrochlorothiazide 25 Mg Tabs (Hydrochlorothiazide) .... By Mouth Once Daily  Allergies (verified): No Known Drug Allergies  Past History:  Past Medical History: Last updated: 05/19/2007 Coronary artery disease GERD Hyperlipidemia Hypertension Benign prostatic hypertrophy carotid vascular disease Colonic polyps, hx of B12 deficiency  Past Surgical History: Last updated: 09/29/2006 Carotid endarterectomy PTCA/stent quadriceps muscle attachment  Family History: Last updated: 2006/10/14 father--deceased etoh/tobacco 75yo mother--deceased COPD 64yo  Social History: Last updated: 07/11/2007 Retired Married wife with new liver transplant  Risk Factors: Smoking Status: quit (12/16/2009)  Physical Exam  General:  alert and well-developed.   Head:  normocephalic and atraumatic.   Eyes:  pupils equal and pupils round.   Ears:  R ear normal and L ear normal.   Neck:  No deformities, masses, or tenderness noted. Chest Wall:  No deformities, masses, tenderness or gynecomastia noted. Lungs:  normal respiratory effort and no intercostal retractions.   Heart:  normal rate and regular rhythm.   Abdomen:  soft and non-tender.   Skin:  turgor normal and color normal.   Psych:  good eye contact and not anxious appearing.     Impression & Recommendations:  Problem # 1:  HYPERTENSION (ICD-401.9) controlled he needs to go home and check meds---i can't find where I  ever gave him a lisinopril Rx His updated medication list for this problem includes:    Doxazosin Mesylate 8 Mg Tabs (Doxazosin mesylate) .[name]... Take 1 tablet by mouth at bedtime    Hydrochlorothiazide 25 Mg Tabs (Hydrochlorothiazide) ..... By mouth once daily  BP today: 120/80 Prior BP: 142/70 (04/14/2010)  Prior 10 Yr Risk Heart Disease: N/A (09/30/2006)  Labs Reviewed: K+: 5.4  (05/26/2010) Creat: : 2.0 (05/26/2010)   Chol: 127 (04/07/2010)   HDL: 70.00 (04/07/2010)   LDL: 49 (04/07/2010)   TG: 38.0 (04/07/2010)  Problem # 2:  HYPERLIPIDEMIA (ICD-272.4)  His updated medication list for this problem includes:    Simvastatin 40 Mg Tabs (Simvastatin) .[name]... Take 1 tablet by mouth at bedtime  Labs Reviewed: SGOT: 21 (04/07/2010)   SGPT: 19 (04/07/2010)  Lipid Goals: Chol Goal: 200 (09/30/2006)   HDL Goal: 40 (09/30/2006)   LDL Goal: 100 (09/30/2006)   TG Goal: 150 (09/30/2006)  Prior 10 Yr Risk Heart Disease: N/A (09/30/2006)   HDL:70.00 (04/07/2010), 60.40 (10/11/2009)  LDL:49 (04/07/2010), 41 (10/11/2009)  Chol:127 (04/07/2010), 116 (10/11/2009)  Trig:38.0 (04/07/2010), 73.0 (10/11/2009)  Problem # 3:  CORONARY ARTERY DISEASE (ICD-414.00) no sxs continue current medications  His updated medication list for this problem includes:    Aspir-81 81 Mg Tbec (Aspirin) .[name]... Take 2 tablet by mouth once a day    Doxazosin Mesylate 8 Mg Tabs (Doxazosin mesylate) .[name]... Take 1 tablet by mouth at bedtime    Nitroglycerin 0.4 Mg Subl (Nitroglycerin) .[name]... Place 1 tablet under tongue as directed    Isosorbide Mononitrate Cr 30 Mg Xr24h-tab (Isosorbide mononitrate) .[name]... 1/2 daily    Hydrochlorothiazide 25 Mg Tabs (Hydrochlorothiazide) ..... By mouth once daily  Problem # 4:  RENAL INSUFFICIENCY (ICD-588.9) check meds  Complete Medication List: 1)  Alphagan P 0.1 % Soln (Brimonidine tartrate) .... Apply twice a day 2)  Aspir-81 81 Mg Tbec (Aspirin) .... Take 2 tablet by mouth once a day 3)  Cyanocobalamin 1000 Mcg/ml Soln (Cyanocobalamin) .... Inject 1cc every month 4)  Oxybutynin Chloride 5 Mg Tb24 (Oxybutynin chloride) .... Take 1 tablet by mouth once a day 5)  Doxazosin Mesylate 8 Mg Tabs (Doxazosin mesylate) .... Take 1 tablet by mouth at bedtime 6)  Nitroglycerin 0.4 Mg Subl (Nitroglycerin) .... Place 1 tablet under tongue as directed 7)  Simvastatin 40 Mg Tabs  (Simvastatin) .... Take 1 tablet by mouth at bedtime 8)  Isosorbide Mononitrate Cr 30 Mg Xr24h-tab (Isosorbide mononitrate) .... 1/2 daily 9)  Omeprazole 20 Mg Cpdr (Omeprazole) .... Take 1 tablet by mouth two times a day 10)  Hydrochlorothiazide 25 Mg Tabs (Hydrochlorothiazide) .... By mouth once daily  Patient Instructions: 1)  see me 4 months 2)  compare our medications to the ones listed below: I am particularly interested to know whether you are taking a medication with lisinopril it it  Appended Document: Orders Update     Clinical Lists Changes  Orders: Added new Test order of TLB-BMP (Basic Metabolic Panel-BMET) (80048-METABOL) - Signed Added new Service order of Venipuncture (69629) - Signed      Appended Document: Orders Update     Clinical Lists Changes  Orders: Added new Service order of Specimen Handling ([id number]) - Signed

## 2010-12-19 NOTE — Assessment & Plan Note (Signed)
Summary: B12 INJ // RS   Nurse Visit   Allergies: No Known Drug Allergies  Medication Administration  Injection # 1:    Medication: Vit B12 1000 mcg    Diagnosis: ANEMIA, B12 DEFICIENCY (ICD-281.1)    Route: SQ    Site: L deltoid    Exp Date: 05/20/2012    Lot #: [id number]    Mfr: American Regent    Patient tolerated injection without complications    Given by: Lynann Beaver CMA (July 28, 2010 1:56 PM)  Orders Added: 1)  Admin of Therapeutic Inj  intramuscular or subcutaneous [[id number]]

## 2010-12-19 NOTE — Progress Notes (Signed)
Summary: doxazosin refill  Phone Note Refill Request Message from:  Fax from Pharmacy on Apr 14, 2010 2:29 PM  Refills Requested: Medication #1:  DOXAZOSIN MESYLATE 8 MG TABS Take 1 tablet by mouth at bedtime   Notes: [facility] fax # [phone].Marland KitchenMarland KitchenMarland KitchenQty - 90 with 4 refills.    Initial call taken by: Debbra Riding,  Apr 14, 2010 2:30 PM  Follow-up for Phone Call        see Rx Follow-up by: Gladis Riffle, RN,  Apr 14, 2010 2:38 PM    Prescriptions: DOXAZOSIN MESYLATE 8 MG TABS (DOXAZOSIN MESYLATE) Take 1 tablet by mouth at bedtime  #90 x 4   Entered by:   Gladis Riffle, RN   Authorized by:   Birdie Sons MD   Signed by:   Gladis Riffle, RN on 04/14/2010   Method used:   Electronically to        [facility] (mail-order)             ,          Ph: [phone]       Fax: [phone]   RxID:   [id number]

## 2010-12-19 NOTE — Progress Notes (Signed)
Summary: bp question  Phone Note Call from Patient Call back at Home Phone [phone]   Caller: Patient---live call Summary of Call: question about his bp meds. please return call. Says he needs his simvastatin sent to [facility], done, and he says his BP was perfect 113-115 when he was on the lisinopril.  Goes to Dr. Myna Hidalgo tomorrow & if his BP is up, he'll call back.  Rudy Jew, RN  September 13, 2010 2:16 PM      Initial call taken by: Warnell Forester,  September 13, 2010 1:48 PM    Prescriptions: SIMVASTATIN 40 MG TABS (SIMVASTATIN) Take 1 tablet by mouth at bedtime  #90 x 3   Entered by:   Rudy Jew, RN   Authorized by:   Birdie Sons MD   Signed by:   Rudy Jew, RN on 09/13/2010   Method used:   Faxed to ...       [facility] (mail-order)             , [place]         Ph: [phone]       Fax: [phone]   RxID:   [id number]

## 2010-12-19 NOTE — Assessment & Plan Note (Signed)
Summary: b12 inj/njr   Nurse Visit   Allergies: No Known Drug Allergies  Medication Administration  Injection # 1:    Medication: Vit B12 1000 mcg    Diagnosis: ANEMIA, B12 DEFICIENCY (ICD-281.1)    Route: IM    Site: R deltoid    Exp Date: 06/18/2012    Lot #: [id number]    Mfr: American Regent    Patient tolerated injection without complications    Given by: Willy Eddy, LPN (October 24, 2010 2:16 PM)  Orders Added: 1)  Vit B12 1000 mcg [J3420] 2)  Admin of Therapeutic Inj  intramuscular or subcutaneous [[id number]]

## 2010-12-19 NOTE — Progress Notes (Signed)
Summary: two questions  Phone Note Call from Patient Call back at Home Phone [phone]   Caller: Patient--live Call For: Birdie Sons MD Summary of Call: 1. should he take OTC iron 325mg  tabs? 2.  Has PSA on 04/07/10.  Could he add CBC to see if needs inj at cancer center for Hb <11?  (read last note and to return there for rov 2 months from 02/22/10). Initial call taken by: Gladis Riffle, RN,  Mar 22, 2010 2:07 PM  Follow-up for Phone Call        yes to all Follow-up by: Birdie Sons MD,  Mar 24, 2010 9:14 AM  Additional Follow-up for Phone Call Additional follow up Details #1::        left message on machine for patient to return our call Additional Follow-up by: Kern Reap CMA Duncan Dull),  Mar 24, 2010 5:07 PM    Additional Follow-up for Phone Call Additional follow up Details #2::    call pt [phone] Follow-up by: Warnell Forester,  Mar 27, 2010 8:04 AM  Additional Follow-up for Phone Call Additional follow up Details #3:: Details for Additional Follow-up Action Taken: Patient notified. cbc added to lab work 5/20. Additional Follow-up by: Gladis Riffle, RN,  Mar 27, 2010 12:21 PM  New/Updated Medications: IRON 325 (65 FE) MG TABS (FERROUS SULFATE) once daily

## 2010-12-19 NOTE — Assessment & Plan Note (Signed)
Summary: b12 inj/njr   Nurse Visit   Allergies: No Known Drug Allergies  Medication Administration  Injection # 1:    Medication: Vit B12 1000 mcg    Diagnosis: ANEMIA, B12 DEFICIENCY (ICD-281.1)    Route: IM    Site: L deltoid    Exp Date: 12/21/2011    Lot #: [id number]    Mfr: American Regent    Patient tolerated injection without complications    Given by: Gladis Riffle, RN (June 27, 2010 2:07 PM)  Orders Added: 1)  Vit B12 1000 mcg [J3420] 2)  Admin of Therapeutic Inj  intramuscular or subcutaneous [[id number]]   Medication Administration  Injection # 1:    Medication: Vit B12 1000 mcg    Diagnosis: ANEMIA, B12 DEFICIENCY (ICD-281.1)    Route: IM    Site: L deltoid    Exp Date: 12/21/2011    Lot #: [id number]    Mfr: American Regent    Patient tolerated injection without complications    Given by: Gladis Riffle, RN (June 27, 2010 2:07 PM)  Orders Added: 1)  Vit B12 1000 mcg [J3420] 2)  Admin of Therapeutic Inj  intramuscular or subcutaneous [[id number]]

## 2010-12-19 NOTE — Letter (Signed)
[facility]   Imported By: Maryln Gottron 07/06/2010 12:45:48  _____________________________________________________________________  External Attachment:    Type:   Image     Comment:   External Document

## 2010-12-19 NOTE — Assessment & Plan Note (Signed)
Summary: B-12 INJ/CJR   Nurse Visit   Allergies: No Known Drug Allergies  Medication Administration  Injection # 1:    Medication: Vit B12 1000 mcg    Diagnosis: ANEMIA, B12 DEFICIENCY (ICD-281.1)    Route: IM    Site: R deltoid    Exp Date: 07/21/2011    Lot #: [id number]    Mfr: American Regent    Patient tolerated injection without complications    Given by: Gladis Riffle, RN (January 20, 2010 1:45 PM)  Orders Added: 1)  Vit B12 1000 mcg [J3420] 2)  Admin of Therapeutic Inj  intramuscular or subcutaneous [[id number]]   Medication Administration  Injection # 1:    Medication: Vit B12 1000 mcg    Diagnosis: ANEMIA, B12 DEFICIENCY (ICD-281.1)    Route: IM    Site: R deltoid    Exp Date: 07/21/2011    Lot #: [id number]    Mfr: American Regent    Patient tolerated injection without complications    Given by: Gladis Riffle, RN (January 20, 2010 1:45 PM)  Orders Added: 1)  Vit B12 1000 mcg [J3420] 2)  Admin of Therapeutic Inj  intramuscular or subcutaneous [[id number]]

## 2010-12-19 NOTE — Progress Notes (Signed)
Summary: Pt req HCTZ 25mg  be sent to [facility] Order  Phone Note Refill Request Call back at [facility] Phone [phone]   Refills Requested: Medication #1:  HYDROCHLOROTHIAZIDE 25 MG TABS by mouth once daily.   Dosage confirmed as above?Dosage Confirmed Pls call in to [facility] [phone].   Pt would like to be notified when this has been done.     Method Requested: Telephone to  [facility] [phone] Initial call taken by: Lucy Antigua,  June 22, 2010 11:14 AM Caller: Patient Summary of Call: Pt called re: the   Follow-up for Phone Call        Done.  See Rx. Follow-up by: Gladis Riffle, RN,  June 22, 2010 11:44 AM  Additional Follow-up for Phone Call Additional follow up Details #1::        I called pt and notified them that this has been done as noted above.   Additional Follow-up by: Lucy Antigua,  June 22, 2010 11:53 AM    Prescriptions: HYDROCHLOROTHIAZIDE 25 MG TABS (HYDROCHLOROTHIAZIDE) by mouth once daily  #90 x 3   Entered by:   Gladis Riffle, RN   Authorized by:   Birdie Sons MD   Signed by:   Gladis Riffle, RN on 06/22/2010   Method used:   Faxed to ...       [facility] (mail-[facility])             , [place]         Ph: [phone]       Fax: [phone]   RxID:   [id number]

## 2010-12-19 NOTE — Progress Notes (Signed)
Summary: REFILL REQUEST (Lisinopril-HCTZ)  Phone Note Refill Request Message from:  Patient on Apr 04, 2010 12:21 PM  Refills Requested: Medication #1:  LISINOPRIL-HYDROCHLOROTHIAZIDE 20-25 MG  TABS Take 1 tab by mouth daily   Notes: Pt req that Rx be sent to [facility].    Initial call taken by: Debbra Riding,  Apr 04, 2010 12:22 PM    Prescriptions: LISINOPRIL-HYDROCHLOROTHIAZIDE 20-25 MG  TABS (LISINOPRIL-HYDROCHLOROTHIAZIDE) Take 1 tab by mouth daily  #90 x 3   Entered by:   Gladis Riffle, RN   Authorized by:   Birdie Sons MD   Signed by:   Gladis Riffle, RN on 04/04/2010   Method used:   Electronically to        [facility] (mail-order)             ,          Ph: [phone]       Fax: [phone]   RxID:   [id number]

## 2010-12-19 NOTE — Letter (Signed)
Summary:  Cancer Center  [facility]   Imported By: Maryln Gottron 08/04/2010 13:23:06  _____________________________________________________________________  External Attachment:    Type:   Image     Comment:   External Document

## 2010-12-19 NOTE — Assessment & Plan Note (Signed)
Summary: B   Nurse Visit   Allergies: No Known Drug Allergies  Medication Administration  Injection # 1:    Medication: Vit B12 1000 mcg    Diagnosis: ANEMIA, B12 DEFICIENCY (ICD-281.1)    Route: IM    Site: L deltoid    Exp Date: 02/18/2011    Lot #: [id number]    Mfr: American Regent    Patient tolerated injection without complications    Given by: Gladis Riffle, RN (December 23, 2009 2:07 PM)  Orders Added: 1)  Vit B12 1000 mcg [J3420] 2)  Admin of Therapeutic Inj  intramuscular or subcutaneous [96372]   Medication Administration  Injection # 1:    Medication: Vit B12 1000 mcg    Diagnosis: ANEMIA, B12 DEFICIENCY (ICD-281.1)    Route: IM    Site: L deltoid    Exp Date: 02/18/2011    Lot #: [id number]    Mfr: American Regent    Patient tolerated injection without complications    Given by: Gladis Riffle, RN (December 23, 2009 2:07 PM)  Orders Added: 1)  Vit B12 1000 mcg [J3420] 2)  Admin of Therapeutic Inj  intramuscular or subcutaneous [54098]

## 2010-12-19 NOTE — Letter (Signed)
[facility]   Imported By: Maryln Gottron 01/09/2010 14:41:30  _____________________________________________________________________  External Attachment:    Type:   Image     Comment:   External Document

## 2010-12-19 NOTE — Progress Notes (Signed)
Summary: 90 day rx  Phone Note Call from Patient Call back at Home Phone [phone]   Caller: Patient Call For: Birdie Sons MD Summary of Call: pt needs 90 day rx with 3 refills isosorbide 30mg  fax to [facility] [phone] Initial call taken by: Heron Sabins,  May 30, 2010 4:47 PM    Prescriptions: ISOSORBIDE MONONITRATE CR 30 MG XR24H-TAB (ISOSORBIDE MONONITRATE) 1/2 daily  #90 x 3   Entered by:   Josph Macho RMA   Authorized by:   Birdie Sons MD   Signed by:   Josph Macho RMA on 05/30/2010   Method used:   Faxed to ...       [facility] (mail-order)             , [place]         Ph: [phone]       Fax: [phone]   RxID:   [id number]   Appended Document: 90 day rx Printed and faxed again.

## 2010-12-19 NOTE — Assessment & Plan Note (Signed)
Summary: B12 INJ//CCM   Nurse Visit   Allergies: No Known Drug Allergies  Medication Administration  Injection # 1:    Medication: Vit B12 1000 mcg    Diagnosis: ANEMIA, B12 DEFICIENCY (ICD-281.1)    Route: IM    Site: L deltoid    Exp Date: 07/21/2011    Lot #: [id number]    Mfr: American Regent    Patient tolerated injection without complications    Given by: Gladis Riffle, RN (Mar 22, 2010 2:03 PM)  Orders Added: 1)  Vit B12 1000 mcg [J3420] 2)  Admin of Therapeutic Inj  intramuscular or subcutaneous [96372]   Medication Administration  Injection # 1:    Medication: Vit B12 1000 mcg    Diagnosis: ANEMIA, B12 DEFICIENCY (ICD-281.1)    Route: IM    Site: L deltoid    Exp Date: 07/21/2011    Lot #: [id number]    Mfr: American Regent    Patient tolerated injection without complications    Given by: Gladis Riffle, RN (Mar 22, 2010 2:03 PM)  Orders Added: 1)  Vit B12 1000 mcg [J3420] 2)  Admin of Therapeutic Inj  intramuscular or subcutaneous [66063]

## 2010-12-19 NOTE — Assessment & Plan Note (Signed)
Summary: 2 month rov/njr pt rsc/njr/pt rsc/cjr   Vital Signs:  Patient profile:   77 year old male Height:      66.5 inches Weight:      171 pounds BMI:     27.28 Temp:     98.3 degrees F Pulse rate:   72 / minute BP sitting:   126 / 68  (left arm)  Vitals Entered By: Gladis Riffle, RN (December 16, 2009 10:51 AM) CC: 2 month rov Is Patient Diabetic? No   CC:  2 month rov.  History of Present Illness:  Follow-Up Visit      This is a 77 year old man who presents for Follow-up visit.  The patient denies chest pain and palpitations.  Since the last visit the patient notes no new problems or concerns.  The patient reports taking meds as prescribed.  When questioned about possible medication side effects, the patient notes none.    All other systems reviewed and were negative   Preventive Screening-Counseling & Management  Alcohol-Tobacco     Smoking Status: quit  Current Problems (verified): 1)  Preventive Health Care  (ICD-V70.0) 2)  Anemia, B12 Deficiency  (ICD-281.1) 3)  Colonic Polyps, Hx of  (ICD-V12.72) 4)  Benign Prostatic Hypertrophy  (ICD-600.00) 5)  Hypertension  (ICD-401.9) 6)  Hyperlipidemia  (ICD-272.4) 7)  Gerd  (ICD-530.81) 8)  Coronary Artery Disease  (ICD-414.00)  Allergies (verified): No Known Drug Allergies  Comments:  Nurse/Medical Assistant: 2 month rov  The patient's medications and allergies were reviewed with the patient and were updated in the Medication and Allergy Lists. Gladis Riffle, RN (December 16, 2009 10:52 AM)  Past History:  Past Medical History: Last updated: 05/19/2007 Coronary artery disease GERD Hyperlipidemia Hypertension Benign prostatic hypertrophy carotid vascular disease Colonic polyps, hx of B12 deficiency  Past Surgical History: Last updated: 09/29/2006 Carotid endarterectomy PTCA/stent quadriceps muscle attachment  Family History: Last updated: 10/20/2006 father--deceased etoh/tobacco 69yo mother--deceased  COPD 64yo  Social History: Last updated: 07/11/2007 Retired Married wife with new liver transplant  Risk Factors: Smoking Status: quit (12/16/2009)  Physical Exam  General:  Well-developed,well-nourished,in no acute distress; alert,appropriate and cooperative throughout examination Head:  normocephalic and atraumatic.   Eyes:  pupils equal and pupils round.   Ears:  R ear normal and L ear normal.   Nose:  no external deformity and no external erythema.   Neck:  No deformities, masses, or tenderness noted. Chest Wall:  No deformities, masses, tenderness or gynecomastia noted. Lungs:  normal respiratory effort and no intercostal retractions.   Heart:  normal rate and regular rhythm.   Abdomen:  Bowel sounds positive,abdomen soft and non-tender without masses, organomegaly or hernias noted. Msk:  No deformity or scoliosis noted of thoracic or lumbar spine.   Pulses:  R radial normal and L radial normal.   Neurologic:  cranial nerves II-XII intact and gait normal.     Impression & Recommendations:  Problem # 1:  ANEMIA, B12 DEFICIENCY (ICD-281.1) continue B12 injections His updated medication list for this problem includes:    Cyanocobalamin 1000 Mcg/ml Soln (Cyanocobalamin) ..... Inject 1cc every month  Problem # 2:  HYPERTENSION (ICD-401.9) controlled continue current medications  His updated medication list for this problem includes:    Lisinopril-hydrochlorothiazide 20-25 Mg Tabs (Lisinopril-hydrochlorothiazide) .[name]... Take 1 tab by mouth daily    Doxazosin Mesylate 8 Mg Tabs (Doxazosin mesylate) .[name]... Take 1 tablet by mouth at bedtime  BP today: 126/68 Prior BP: 138/70 (10/18/2009)  Prior 10  Yr Risk Heart Disease: N/A (09/30/2006)  Labs Reviewed: K+: 4.7 (10/11/2009) Creat: : 1.7 (10/11/2009)   Chol: 116 (10/11/2009)   HDL: 60.40 (10/11/2009)   LDL: 41 (10/11/2009)   TG: 73.0 (10/11/2009)  Problem # 3:  CORONARY ARTERY DISEASE (ICD-414.00) no sxs continue current  medications  His updated medication list for this problem includes:    Lisinopril-hydrochlorothiazide 20-25 Mg Tabs (Lisinopril-hydrochlorothiazide) .[name]... Take 1 tab by mouth daily    Aspir-81 81 Mg Tbec (Aspirin) .[name]... Take 1 tablet by mouth once a day    Doxazosin Mesylate 8 Mg Tabs (Doxazosin mesylate) .[name]... Take 1 tablet by mouth at bedtime    Nitroglycerin 0.4 Mg Subl (Nitroglycerin) .[name]... Place 1 tablet under tongue as directed    Isosorbide Mononitrate Cr 30 Mg Xr24h-tab (Isosorbide mononitrate) ..... One daily    Aspirin 81 Mg Tbec (Aspirin) .Marland KitchenMarland KitchenMarland KitchenMarland Kitchen 4 daily  Labs Reviewed: Chol: 116 (10/11/2009)   HDL: 60.40 (10/11/2009)   LDL: 41 (10/11/2009)   TG: 73.0 (10/11/2009)  Lipid Goals: Chol Goal: 200 (09/30/2006)   HDL Goal: 40 (09/30/2006)   LDL Goal: 100 (09/30/2006)   TG Goal: 150 (09/30/2006)  Problem # 4:  GERD (ICD-530.81) well controlled continue current medications  His updated medication list for this problem includes:    Omeprazole 20 Mg Cpdr (Omeprazole) .[name]... Take 1 tablet by mouth two times a day  Complete Medication List: 1)  Alphagan P 0.1 % Soln (Brimonidine tartrate) .... Apply twice a day 2)  Lisinopril-hydrochlorothiazide 20-25 Mg Tabs (Lisinopril-hydrochlorothiazide) .... Take 1 tab by mouth daily 3)  Aspir-81 81 Mg Tbec (Aspirin) .... Take 1 tablet by mouth once a day 4)  Cyanocobalamin 1000 Mcg/ml Soln (Cyanocobalamin) .... Inject 1cc every month 5)  Oxybutynin Chloride 5 Mg Tb24 (Oxybutynin chloride) .... Take 1 tablet by mouth once a day 6)  Doxazosin Mesylate 8 Mg Tabs (Doxazosin mesylate) .... Take 1 tablet by mouth at bedtime 7)  Nitroglycerin 0.4 Mg Subl (Nitroglycerin) .... Place 1 tablet under tongue as directed 8)  Simvastatin 40 Mg Tabs (Simvastatin) .... Take 1 tablet by mouth at bedtime 9)  Isosorbide Mononitrate Cr 30 Mg Xr24h-tab (Isosorbide mononitrate) .... One daily 10)  Omeprazole 20 Mg Cpdr (Omeprazole) .... Take 1 tablet by mouth two times a  day 11)  Aspirin 81 Mg Tbec (Aspirin) .... 4 daily  Patient Instructions: 1)  Please schedule a follow-up appointment in 4 months. 2)  lipids 272.4 3)  liver 995.2 4)  psa---increasing psa 5)  bmet--995.2 6)  CBCdiff--285.9

## 2010-12-19 NOTE — Letter (Signed)
[facility]   Imported By: Maryln Gottron 05/25/2010 12:39:59  _____________________________________________________________________  External Attachment:    Type:   Image     Comment:   External Document

## 2010-12-19 NOTE — Letter (Signed)
Summary: [facility]  [facility]   Imported By: Maryln Gottron 09/04/2010 09:54:24  _____________________________________________________________________  External Attachment:    Type:   Image     Comment:   External Document

## 2010-12-19 NOTE — Assessment & Plan Note (Signed)
Summary: B12 INJ // RS   Nurse Visit   Allergies: No Known Drug Allergies  Medication Administration  Injection # 1:    Medication: Vit B12 1000 mcg    Diagnosis: ANEMIA, B12 DEFICIENCY (ICD-281.1)    Route: IM    Site: L deltoid    Exp Date: 05/19/2012    Lot #: [id number]    Mfr: American Regent    Patient tolerated injection without complications    Given by: Kern Reap CMA (AAMA) (September 22, 2010 2:08 PM)  Orders Added: 1)  Vit B12 1000 mcg [J3420] 2)  Admin of Therapeutic Inj  intramuscular or subcutaneous [[id number]]

## 2010-12-19 NOTE — Letter (Signed)
[facility]   Imported By: Maryln Gottron 03/17/2010 15:37:40  _____________________________________________________________________  External Attachment:    Type:   Image     Comment:   External Document

## 2010-12-19 NOTE — Progress Notes (Signed)
Summary: REQ FOR REFILL (Oxybutynin Chloride 5 Mg)  Phone Note Call from Patient   Caller: Patient [phone] Reason for Call: Refill Medication Summary of Call: Pt called to req that a refill Rx for med: Oxybutynin Chloride 5 Mg  be faxed to [facility]  Initial call taken by: Debbra Riding,  January 31, 2010 10:52 AM    Prescriptions: OXYBUTYNIN CHLORIDE 5 MG TB24 (OXYBUTYNIN CHLORIDE) Take 1 tablet by mouth once a day  #90 x 3   Entered by:   Willy Eddy, LPN   Authorized by:   Birdie Sons MD   Signed by:   Willy Eddy, LPN on 09/81/1914   Method used:   Electronically to        [facility] (mail-order)             ,          Ph: [phone]       Fax: [phone]   RxID:   [id number]

## 2010-12-19 NOTE — Assessment & Plan Note (Signed)
Summary: B12 INJ//CCM/pt rsc/cjr   Nurse Visit   Allergies: No Known Drug Allergies  Medication Administration  Injection # 1:    Medication: Vit B12 1000 mcg    Diagnosis: ANEMIA, B12 DEFICIENCY (ICD-281.1)    Route: IM    Site: L deltoid    Exp Date: 07/21/2011    Lot #: [id number]    Mfr: American Regent    Patient tolerated injection without complications    Given by: Gladis Riffle, RN (February 21, 2010 2:39 PM)  Orders Added: 1)  Vit B12 1000 mcg [J3420] 2)  Admin of Therapeutic Inj  intramuscular or subcutaneous [96372]   Medication Administration  Injection # 1:    Medication: Vit B12 1000 mcg    Diagnosis: ANEMIA, B12 DEFICIENCY (ICD-281.1)    Route: IM    Site: L deltoid    Exp Date: 07/21/2011    Lot #: [id number]    Mfr: American Regent    Patient tolerated injection without complications    Given by: Gladis Riffle, RN (February 21, 2010 2:39 PM)  Orders Added: 1)  Vit B12 1000 mcg [J3420] 2)  Admin of Therapeutic Inj  intramuscular or subcutaneous [96295]

## 2010-12-21 NOTE — Letter (Signed)
Summary: [facility]  [facility]   Imported By: Maryln Gottron 10/31/2010 10:36:46  _____________________________________________________________________  External Attachment:    Type:   Image     Comment:   External Document

## 2010-12-21 NOTE — Progress Notes (Signed)
Summary: REFILL REQUEST  Phone Note Refill Request Message from:  Patient on December 14, 2010 12:37 PM  Refills Requested: Medication #1:  SIMVASTATIN 40 MG TABS Take 1 tablet by mouth at bedtime   Notes: [facility] mail order pharmacy.... Pt would like to know when Rx has been sent in please.    Initial call taken by: Debbra Riding,  December 14, 2010 12:37 PM  Follow-up for Phone Call        Rx called to pharmacy Follow-up by: Alfred Levins, CMA,  December 14, 2010 1:20 PM    Prescriptions: SIMVASTATIN 40 MG TABS (SIMVASTATIN) Take 1 tablet by mouth at bedtime  #90 x 3   Entered by:   Alfred Levins, CMA   Authorized by:   Birdie Sons MD   Signed by:   Alfred Levins, CMA on 12/14/2010   Method used:   Faxed to ...       [facility] (mail-order)             , [place]         Ph: [phone]       Fax: [phone]   RxID:   [id number] SIMVASTATIN 40 MG TABS (SIMVASTATIN) Take 1 tablet by mouth at bedtime  #90 x 3   Entered by:   Alfred Levins, CMA   Authorized by:   Birdie Sons MD   Signed by:   Alfred Levins, CMA on 12/14/2010   Method used:   Electronically to        [facility]  [address] [phone]* (retail)       [address]       [place], [place]  [address]       Ph: [phone] or [phone]       Fax: [phone]   RxID:   [id number]

## 2010-12-21 NOTE — Assessment & Plan Note (Signed)
Summary: b12 inj//pt to come at 10:30am//slm   Nurse Visit   Allergies: No Known Drug Allergies  Medication Administration  Injection # 1:    Medication: Vit B12 1000 mcg    Diagnosis: ANEMIA, B12 DEFICIENCY (ICD-281.1)    Route: IM    Site: L deltoid    Exp Date: 7/13    Lot #: [id number]    Mfr: American Regent    Patient tolerated injection without complications    Given by: Alfred Levins, CMA (November 27, 2010 8:34 AM)  Orders Added: 1)  Vit B12 1000 mcg [J3420] 2)  Admin of Therapeutic Inj  intramuscular or subcutaneous [28413]

## 2010-12-21 NOTE — Assessment & Plan Note (Signed)
Summary: 4 month rov/njr   Vital Signs:  Patient profile:   77 year old male Weight:      178 pounds Temp:     97.8 degrees F oral BP sitting:   142 / 84  (left arm) Cuff size:   regular  Vitals Entered By: Azucena Freed, MA Student  CC: 4 mon rov----bp concerns   Is Patient Diabetic? No   CC:  4 mon rov----bp concerns  .  History of Present Illness:  Follow-Up Visit      This is a 77 year old man who presents for Follow-up visit.  The patient denies chest pain and palpitations.  Since the last visit the patient notes no new problems or concerns and being seen by a specialist---concerned with BP.[name]  The patient reports taking meds as prescribed.  When questioned about possible medication side effects, the patient notes none.    HGB has been stable---seeing dr Myna Hidalgo  All other systems reviewed and were negative except he has noted some ankle swelling  Current Problems (verified): 1)  Renal Insufficiency  (ICD-588.9) 2)  Preventive Health Care  (ICD-V70.0) 3)  Anemia, B12 Deficiency  (ICD-281.1) 4)  Colonic Polyps, Hx of  (ICD-V12.72) 5)  Benign Prostatic Hypertrophy  (ICD-600.00) 6)  Hypertension  (ICD-401.9) 7)  Hyperlipidemia  (ICD-272.4) 8)  Gerd  (ICD-530.81) 9)  Coronary Artery Disease  (ICD-414.00)  Current Medications (verified): 1)  Alphagan P 0.1 % Soln (Brimonidine Tartrate) .... Apply Twice A Day 2)  Aspir-81 81 Mg Tbec (Aspirin) .... Take 2 Tablet By Mouth Once A Day 3)  Cyanocobalamin 1000 Mcg/ml Soln (Cyanocobalamin) .... Inject 1cc Every Month 4)  Oxybutynin Chloride 5 Mg Tb24 (Oxybutynin Chloride) .... Take 1 Tablet By Mouth Once A Day 5)  Doxazosin Mesylate 8 Mg Tabs (Doxazosin Mesylate) .... Take 1 Tablet By Mouth At Bedtime 6)  Nitroglycerin 0.4 Mg Subl (Nitroglycerin) .... Place 1 Tablet Under Tongue As Directed 7)  Simvastatin 40 Mg Tabs (Simvastatin) .... Take 1 Tablet By Mouth At Bedtime 8)  Isosorbide Mononitrate Cr 30 Mg Xr24h-Tab (Isosorbide  Mononitrate) .... 1/2 Daily 9)  Omeprazole 20 Mg  Cpdr (Omeprazole) .... Take 1 Tablet By Mouth Two Times A Day 10)  Hydrochlorothiazide 25 Mg Tabs (Hydrochlorothiazide) .... By Mouth Once Daily  Allergies (verified): No Known Drug Allergies  Past History:  Past Medical History: Last updated: 05/19/2007 Coronary artery disease GERD Hyperlipidemia Hypertension Benign prostatic hypertrophy carotid vascular disease Colonic polyps, hx of B12 deficiency  Past Surgical History: Last updated: 09/29/2006 Carotid endarterectomy PTCA/stent quadriceps muscle attachment  Family History: Last updated: 04-Oct-2006 father--deceased etoh/tobacco 69yo mother--deceased COPD 64yo  Social History: Last updated: 07/11/2007 Retired Married wife with new liver transplant  Risk Factors: Smoking Status: quit (12/16/2009)  Physical Exam  General:   developed well nourished male in no acute distress. HEENT exam atraumatic, normocephalic symmetric muscles are intact. Neck is supple without jugular venous distention. Chest clear to auscultation cardiac exam S1-S2 are regular.  abdominal examined vessels, soft extremities there is 1+ edema at the ankles   Impression & Recommendations:  Problem # 1:  HYPERTENSION (ICD-401.9)   not as well controlled historically. I think he'll need change in medications. Will check lab work first. he does have some edema. I think that needs to be evaluated with laboratory work. Based on that we'll determine His updated medication list for this problem includes:    Doxazosin Mesylate 8 Mg Tabs (Doxazosin mesylate) .[name]... Take 1 tablet by  mouth at bedtime    Hydrochlorothiazide 25 Mg Tabs (Hydrochlorothiazide) ..... By mouth once daily  Orders: TLB-BMP (Basic Metabolic Panel-BMET) (80048-METABOL)  Problem # 2:  RENAL INSUFFICIENCY (ICD-588.9) Assessment: Unchanged  needs followup.  Problem # 3:  HYPERLIPIDEMIA (ICD-272.4)  His updated medication list for  this problem includes:    Simvastatin 40 Mg Tabs (Simvastatin) .[name]... Take 1 tablet by mouth at bedtime  Labs Reviewed: SGOT: 21 (04/07/2010)   SGPT: 19 (04/07/2010)  Lipid Goals: Chol Goal: 200 (09/30/2006)   HDL Goal: 40 (09/30/2006)   LDL Goal: 100 (09/30/2006)   TG Goal: 150 (09/30/2006)  Prior 10 Yr Risk Heart Disease: N/A (09/30/2006)   HDL:70.00 (04/07/2010), 60.40 (10/11/2009)  LDL:49 (04/07/2010), 41 (10/11/2009)  Chol:127 (04/07/2010), 116 (10/11/2009)  Trig:38.0 (04/07/2010), 73.0 (10/11/2009)  Orders: Venipuncture (57846) TLB-Lipid Panel (80061-LIPID) TLB-Hepatic/Liver Function Pnl (80076-HEPATIC) TLB-TSH (Thyroid Stimulating Hormone) (84443-TSH)  Problem # 4:  CORONARY ARTERY DISEASE (ICD-414.00)  no symptoms. Continue current medications. His updated medication list for this problem includes:    Aspir-81 81 Mg Tbec (Aspirin) .[name]... Take 2 tablet by mouth once a day    Doxazosin Mesylate 8 Mg Tabs (Doxazosin mesylate) .[name]... Take 1 tablet by mouth at bedtime    Nitroglycerin 0.4 Mg Subl (Nitroglycerin) .[name]... Place 1 tablet under tongue as directed    Isosorbide Mononitrate Cr 30 Mg Xr24h-tab (Isosorbide mononitrate) .[name]... 1/2 daily    Hydrochlorothiazide 25 Mg Tabs (Hydrochlorothiazide) ..... By mouth once daily  Complete Medication List: 1)  Alphagan P 0.1 % Soln (Brimonidine tartrate) .... Apply twice a day 2)  Aspir-81 81 Mg Tbec (Aspirin) .... Take 2 tablet by mouth once a day 3)  Cyanocobalamin 1000 Mcg/ml Soln (Cyanocobalamin) .... Inject 1cc every month 4)  Oxybutynin Chloride 5 Mg Tb24 (Oxybutynin chloride) .... Take 1 tablet by mouth once a day 5)  Doxazosin Mesylate 8 Mg Tabs (Doxazosin mesylate) .... Take 1 tablet by mouth at bedtime 6)  Nitroglycerin 0.4 Mg Subl (Nitroglycerin) .... Place 1 tablet under tongue as directed 7)  Simvastatin 40 Mg Tabs (Simvastatin) .... Take 1 tablet by mouth at bedtime 8)  Isosorbide Mononitrate Cr 30 Mg Xr24h-tab (Isosorbide  mononitrate) .... 1/2 daily 9)  Omeprazole 20 Mg Cpdr (Omeprazole) .... Take 1 tablet by mouth two times a day 10)  Hydrochlorothiazide 25 Mg Tabs (Hydrochlorothiazide) .... By mouth once daily  Other Orders: TLB-CBC Platelet - w/Differential (85025-CBCD)  Patient Instructions: 1)  Please schedule a follow-up appointment in 3 months.   Orders Added: 1)  Est. Patient Level IV [96295] 2)  Venipuncture [36415] 3)  TLB-Lipid Panel [80061-LIPID] 4)  TLB-Hepatic/Liver Function Pnl [80076-HEPATIC] 5)  TLB-TSH (Thyroid Stimulating Hormone) [84443-TSH] 6)  TLB-BMP (Basic Metabolic Panel-BMET) [80048-METABOL] 7)  TLB-CBC Platelet - w/Differential [85025-CBCD]  Appended Document: Orders Update     Clinical Lists Changes  Orders: Added new Service order of Specimen Handling (28413) - Signed

## 2010-12-21 NOTE — Progress Notes (Signed)
Summary: change meds  Phone Note Call from Patient Call back at Home Phone [phone]   Caller: Patient Call For: Ayce Pietrzyk Summary of Call: Are you going to change his BP med and give him diuretic Initial call taken by: Alfred Levins, CMA,  November 29, 2010 1:28 PM  Follow-up for Phone Call         discontinue hydrochlorothiazide. add furosemide 20 mg by mouth every morning. also take potassium chloride 20 mEq by mouth daily. Follow-up by: Birdie Sons MD,  November 30, 2010 4:32 AM  Additional Follow-up for Phone Call Additional follow up Details #1::        Phone Call Completed, Rx Called In Additional Follow-up by: Alfred Levins, CMA,  November 30, 2010 8:59 AM    New/Updated Medications: FUROSEMIDE 20 MG TABS (FUROSEMIDE) Take 1 tab by mouth every day KLOR-CON M20 20 MEQ CR-TABS (POTASSIUM CHLORIDE CRYS CR) 1 by mouth once daily Prescriptions: KLOR-CON M20 20 MEQ CR-TABS (POTASSIUM CHLORIDE CRYS CR) 1 by mouth once daily  #90 x 3   Entered by:   Alfred Levins, CMA   Authorized by:   Birdie Sons MD   Signed by:   Alfred Levins, CMA on 11/30/2010   Method used:   Electronically to        [facility]* (retail)             ,          Ph: [phone]       Fax: [phone]   RxID:   [id number] FUROSEMIDE 20 MG TABS (FUROSEMIDE) Take 1 tab by mouth every day  #90 x 3   Entered by:   Alfred Levins, CMA   Authorized by:   Birdie Sons MD   Signed by:   Alfred Levins, CMA on 11/30/2010   Method used:   Electronically to        [facility]* (retail)             ,          Ph: [phone]       Fax: [phone]   RxID:   [id number]

## 2010-12-27 ENCOUNTER — Ambulatory Visit (INDEPENDENT_AMBULATORY_CARE_PROVIDER_SITE_OTHER): Payer: Medicare Other | Admitting: Internal Medicine

## 2010-12-27 DIAGNOSIS — D518 Other vitamin B12 deficiency anemias: Secondary | ICD-10-CM

## 2010-12-27 MED ORDER — CYANOCOBALAMIN 1000 MCG/ML IJ SOLN
1000.0000 ug | INTRAMUSCULAR | Status: DC
  Administered 2010-12-27 – 2012-05-29 (×10): 1000 ug via INTRAMUSCULAR

## 2011-01-04 ENCOUNTER — Other Ambulatory Visit: Payer: Self-pay | Admitting: Hematology & Oncology

## 2011-01-04 ENCOUNTER — Encounter (HOSPITAL_BASED_OUTPATIENT_CLINIC_OR_DEPARTMENT_OTHER): Payer: Medicare Other | Admitting: Hematology & Oncology

## 2011-01-04 DIAGNOSIS — D649 Anemia, unspecified: Secondary | ICD-10-CM

## 2011-01-04 DIAGNOSIS — D510 Vitamin B12 deficiency anemia due to intrinsic factor deficiency: Secondary | ICD-10-CM

## 2011-01-04 DIAGNOSIS — N289 Disorder of kidney and ureter, unspecified: Secondary | ICD-10-CM

## 2011-01-04 LAB — CBC WITH DIFFERENTIAL (CANCER CENTER ONLY)
EOS%: 3.7 % (ref 0.0–7.0)
MCH: 33.9 pg — ABNORMAL HIGH (ref 28.0–33.4)
MCHC: 34.4 g/dL (ref 32.0–35.9)
MONO%: 8.1 % (ref 0.0–13.0)
NEUT#: 2.2 10*3/uL (ref 1.5–6.5)
Platelets: 318 10*3/uL (ref 145–400)

## 2011-01-04 LAB — CHCC SATELLITE - SMEAR

## 2011-01-04 LAB — RETICULOCYTES (CHCC): Retic Ct Pct: 1 % (ref 0.4–3.1)

## 2011-01-24 ENCOUNTER — Ambulatory Visit: Payer: Medicare Other | Admitting: Internal Medicine

## 2011-02-12 ENCOUNTER — Other Ambulatory Visit: Payer: Self-pay | Admitting: Hematology & Oncology

## 2011-02-12 ENCOUNTER — Encounter (HOSPITAL_BASED_OUTPATIENT_CLINIC_OR_DEPARTMENT_OTHER): Payer: Medicare Other | Admitting: Hematology & Oncology

## 2011-02-12 ENCOUNTER — Ambulatory Visit: Payer: Medicare Other | Admitting: Internal Medicine

## 2011-02-12 DIAGNOSIS — D649 Anemia, unspecified: Secondary | ICD-10-CM

## 2011-02-12 DIAGNOSIS — D510 Vitamin B12 deficiency anemia due to intrinsic factor deficiency: Secondary | ICD-10-CM

## 2011-02-12 DIAGNOSIS — N289 Disorder of kidney and ureter, unspecified: Secondary | ICD-10-CM

## 2011-02-12 LAB — FERRITIN: Ferritin: 694 ng/mL — ABNORMAL HIGH (ref 22–322)

## 2011-02-12 LAB — RETICULOCYTES (CHCC)
ABS Retic: 32.4 10*3/uL (ref 19.0–186.0)
RBC.: 3.24 MIL/uL — ABNORMAL LOW (ref 4.22–5.81)

## 2011-02-12 LAB — CBC WITH DIFFERENTIAL (CANCER CENTER ONLY)
BASO#: 0 10*3/uL (ref 0.0–0.2)
Eosinophils Absolute: 0.2 10*3/uL (ref 0.0–0.5)
HCT: 31.4 % — ABNORMAL LOW (ref 38.7–49.9)
LYMPH%: 35.5 % (ref 14.0–48.0)
MCH: 34 pg — ABNORMAL HIGH (ref 28.0–33.4)
MCV: 100 fL — ABNORMAL HIGH (ref 82–98)
MONO#: 0.3 10*3/uL (ref 0.1–0.9)
NEUT%: 49.6 % (ref 40.0–80.0)
RBC: 3.15 10*6/uL — ABNORMAL LOW (ref 4.20–5.70)
WBC: 3.7 10*3/uL — ABNORMAL LOW (ref 4.0–10.0)

## 2011-02-20 ENCOUNTER — Ambulatory Visit (INDEPENDENT_AMBULATORY_CARE_PROVIDER_SITE_OTHER): Payer: Medicare Other | Admitting: Internal Medicine

## 2011-02-20 DIAGNOSIS — D518 Other vitamin B12 deficiency anemias: Secondary | ICD-10-CM

## 2011-03-07 ENCOUNTER — Other Ambulatory Visit: Payer: Self-pay | Admitting: *Deleted

## 2011-03-08 ENCOUNTER — Encounter: Payer: Self-pay | Admitting: Internal Medicine

## 2011-03-09 ENCOUNTER — Ambulatory Visit (INDEPENDENT_AMBULATORY_CARE_PROVIDER_SITE_OTHER): Payer: Medicare Other | Admitting: Internal Medicine

## 2011-03-09 ENCOUNTER — Encounter: Payer: Self-pay | Admitting: Internal Medicine

## 2011-03-09 DIAGNOSIS — I10 Essential (primary) hypertension: Secondary | ICD-10-CM

## 2011-03-09 DIAGNOSIS — I2510 Atherosclerotic heart disease of native coronary artery without angina pectoris: Secondary | ICD-10-CM

## 2011-03-09 DIAGNOSIS — N259 Disorder resulting from impaired renal tubular function, unspecified: Secondary | ICD-10-CM

## 2011-03-09 DIAGNOSIS — K219 Gastro-esophageal reflux disease without esophagitis: Secondary | ICD-10-CM

## 2011-03-09 DIAGNOSIS — R109 Unspecified abdominal pain: Secondary | ICD-10-CM

## 2011-03-09 DIAGNOSIS — D518 Other vitamin B12 deficiency anemias: Secondary | ICD-10-CM

## 2011-03-09 DIAGNOSIS — I82409 Acute embolism and thrombosis of unspecified deep veins of unspecified lower extremity: Secondary | ICD-10-CM

## 2011-03-09 LAB — LIPID PANEL
HDL: 77 mg/dL (ref >39.00)
LDL Cholesterol: 55 mg/dL (ref 0–99)
Total CHOL/HDL Ratio: 2
Triglycerides: 29 mg/dL (ref 0.0–149.0)
VLDL: 5.8 mg/dL (ref 0.0–40.0)

## 2011-03-09 LAB — CBC WITH DIFFERENTIAL/PLATELET
Basophils Absolute: 0 10*3/uL (ref 0.0–0.1)
Hemoglobin: 10.8 g/dL — ABNORMAL LOW (ref 13.0–17.0)
Lymphocytes Relative: 32.5 % (ref 12.0–46.0)
Monocytes Relative: 10.1 % (ref 3.0–12.0)
Neutrophils Relative %: 53.4 % (ref 43.0–77.0)
Platelets: 301 10*3/uL (ref 150.0–400.0)
RDW: 13.8 % (ref 11.5–14.6)

## 2011-03-09 LAB — HEPATIC FUNCTION PANEL
ALT: 20 U/L (ref 0–53)
Bilirubin, Direct: 0.1 mg/dL (ref 0.0–0.3)
Total Bilirubin: 0.5 mg/dL (ref 0.3–1.2)

## 2011-03-09 LAB — BASIC METABOLIC PANEL
Chloride: 105 mEq/L (ref 96–112)
Creatinine, Ser: 1.3 mg/dL (ref 0.4–1.5)

## 2011-03-09 NOTE — Progress Notes (Signed)
  Subjective:    Patient ID: Alexander Rice, male    DOB: 1932-05-27, 77 y.o.   MRN: [id number]  HPI  3- 4  Week hx of GI discomfort--bilateral lower quadrant---feels like "a lot of pressure". BMs are o.k, appetite normal  HTN--no home BPs at home  CAD---no sxs  Renal insuff: no sxs  Past Medical History  Diagnosis Date  . CAD (coronary artery disease)   . GERD (gastroesophageal reflux disease)   . Hyperlipidemia   . Hypertension   . BPH (benign prostatic hyperplasia)   . CVD (cardiovascular disease)   . Colon polyps   . B12 deficiency    Past Surgical History  Procedure Date  . Carotid endarterectomy   . Ptca     stent  . Quadriceps repair     muscle attachment    reports that he has never smoked. He does not have any smokeless tobacco history on file. He reports that he does not drink alcohol or use illicit drugs. family history includes Alcohol abuse in his father and COPD in his mother. No Known Allergies   Review of Systems  patient denies chest pain, shortness of breath, orthopnea. Denies lower extremity edema, abdominal pain, change in appetite, change in bowel movements. Patient denies rashes, musculoskeletal complaints. No other specific complaints in a complete review of systems.      Objective:   Physical Exam  well-developed well-nourished male in no acute distress. HEENT exam atraumatic, normocephalic, neck supple without jugular venous distention. Chest clear to auscultation cardiac exam S1-S2 are regular. Abdominal exam overweight with bowel sounds, soft and nontender. Extremities no edema. Neurologic exam is alert with a normal gait.        Assessment & Plan:

## 2011-03-12 ENCOUNTER — Encounter (HOSPITAL_BASED_OUTPATIENT_CLINIC_OR_DEPARTMENT_OTHER): Payer: Medicare Other | Admitting: Hematology & Oncology

## 2011-03-12 ENCOUNTER — Other Ambulatory Visit: Payer: Self-pay | Admitting: Hematology & Oncology

## 2011-03-12 DIAGNOSIS — D649 Anemia, unspecified: Secondary | ICD-10-CM

## 2011-03-12 DIAGNOSIS — D510 Vitamin B12 deficiency anemia due to intrinsic factor deficiency: Secondary | ICD-10-CM

## 2011-03-12 DIAGNOSIS — N289 Disorder of kidney and ureter, unspecified: Secondary | ICD-10-CM

## 2011-03-12 LAB — CBC WITH DIFFERENTIAL (CANCER CENTER ONLY)
BASO#: 0 10*3/uL (ref 0.0–0.2)
Eosinophils Absolute: 0.1 10*3/uL (ref 0.0–0.5)
HGB: 10.7 g/dL — ABNORMAL LOW (ref 13.0–17.1)
LYMPH#: 1.3 10*3/uL (ref 0.9–3.3)
NEUT#: 2.8 10*3/uL (ref 1.5–6.5)
RBC: 3.17 10*6/uL — ABNORMAL LOW (ref 4.20–5.70)
WBC: 4.5 10*3/uL (ref 4.0–10.0)

## 2011-03-12 LAB — CHCC SATELLITE - SMEAR

## 2011-03-12 LAB — IRON AND TIBC
%SAT: 69 % — ABNORMAL HIGH (ref 20–55)
TIBC: 287 ug/dL (ref 215–435)

## 2011-03-12 LAB — FERRITIN: Ferritin: 643 ng/mL — ABNORMAL HIGH (ref 22–322)

## 2011-03-12 NOTE — Assessment & Plan Note (Deleted)
Acute deep venous thrombosis. Best treatment course is warfarin. She was given her last dose of Lovenox in the office today. Patient has been on warfarin previously for atrial fibrillation. This was discontinued secondary to compliance issues. This was discussed with her in detail. I am worried about her ability to monitor and control her own warfarin usage. I still think given the benefit and risk ratios that warfarin therapy is still the best treatment course. I will follow up with her next week. She'll have her pro time checked early next week. 

## 2011-03-12 NOTE — Assessment & Plan Note (Signed)
Not as well-controlled as I would like. The patient states his blood pressures typically better than what was mentioned today. I've asked him to monitor his blood pressure. Goal blood pressure less than 135/85. He will call if his blood pressure remains above those numbers.

## 2011-03-12 NOTE — Assessment & Plan Note (Signed)
Monthly B12 injection.

## 2011-03-12 NOTE — Assessment & Plan Note (Signed)
Has been stable. Continue current medications.

## 2011-03-12 NOTE — Assessment & Plan Note (Signed)
Patient has no symptoms. Continue risk factor modification.

## 2011-03-13 ENCOUNTER — Telehealth: Payer: Self-pay | Admitting: *Deleted

## 2011-03-13 NOTE — Telephone Encounter (Signed)
Gave pt normal lab results and he wants to know what he should do about his stomach pain

## 2011-03-18 NOTE — Telephone Encounter (Signed)
Change prilosec to nexium 40 mg po qd Call in 3 weeks if pain persists

## 2011-03-19 NOTE — Telephone Encounter (Signed)
LMTCB

## 2011-03-20 MED ORDER — ESOMEPRAZOLE MAGNESIUM 40 MG PO CPDR: 40.0000 mg | DELAYED_RELEASE_CAPSULE | Freq: Every day | ORAL | Status: DC

## 2011-03-20 NOTE — Telephone Encounter (Signed)
Notified pt. 

## 2011-03-21 ENCOUNTER — Telehealth: Payer: Self-pay | Admitting: *Deleted

## 2011-03-21 MED ORDER — ESOMEPRAZOLE MAGNESIUM 40 MG PO CPDR: 40.0000 mg | DELAYED_RELEASE_CAPSULE | Freq: Every day | ORAL | Status: DC

## 2011-03-21 NOTE — Telephone Encounter (Signed)
Nexium too expensive locally.  Needs it called to [facility].

## 2011-03-21 NOTE — Telephone Encounter (Signed)
Pt. Did not his prescriptions.  Will resend.

## 2011-03-23 ENCOUNTER — Ambulatory Visit: Payer: Medicare Other | Admitting: Internal Medicine

## 2011-03-27 ENCOUNTER — Ambulatory Visit (INDEPENDENT_AMBULATORY_CARE_PROVIDER_SITE_OTHER): Payer: Medicare Other | Admitting: Internal Medicine

## 2011-03-27 DIAGNOSIS — D518 Other vitamin B12 deficiency anemias: Secondary | ICD-10-CM

## 2011-04-03 NOTE — Assessment & Plan Note (Signed)
[facility] OFFICE NOTE   ADOLPHUS, HANF                   MRN:          [id number]  DATE:04/09/2007                            DOB:          17-Dec-1931    CHIEF COMPLAINT:  Preoperative consult.   HISTORY OF PRESENT ILLNESS:  Mr. Tomko is referred for preoperative  clearance.  He is scheduled to have what he describes as minimally-  invasive back surgery for a pinched nerve.  He has had a foot drop for  a period of time.  He is at a point now where he needs surgical  intervention.  He otherwise is doing well, has no complaints.   His past medical history is extensive and includes:  1. Hypertension.  2. Carotid vascular disease.  3. Hyperlipidemia.  4. Benign prostatic hypertrophy.  5. Coronary artery disease.  6. Gastroesophageal reflux disease.  7. He has a history of a carotid endarterectomy.  8. History of colon polyps.  9. He has had an angioplasty with a stent placed in 1999.  10.He has a history of quadriceps muscle repair.  11.B12 deficiency.   MEDICATIONS:  Outlined on his maintenance medication list.   SOCIAL HISTORY:  He is married, his wife is on a transplant list.   It is worth noting that the patient had an echocardiogram in September  2006.  EF at that time was measured at 50% with known inferior and  posterior hypokinesis.  It is also worth noting that the patient had  coronary angiogram in 2006.  The right coronary artery with a 50-60%  plaque.  There is a previously-placed stent in the RCA.  Estimated EF is  55%.   FAMILY HISTORY:  Father deceased with alcohol and tobacco abuse at 60  years old.  Mother deceased with COPD at 78 years old.   PHYSICAL EXAMINATION:  VITAL SIGNS:  Temperature is 98.1, pulse 76,  respirations 12, blood pressure 146/80.  GENERAL:  He is a well-developed, well-nourished male in no acute  distress.  HEENT:  Atraumatic, normocephalic.  NECK:   Carotid upstrokes are strong without bruits.  Does have surgical  scar from previous carotid endarterectomy.  CHEST:  Clear to auscultation.  CARDIAC:  S1 and S2 are normal without murmurs or gallops.  ABDOMEN:  Active bowel sounds, soft, nontender.  There is no  hepatosplenomegaly, no masses are palpated.  EXTREMITIES:  There is no clubbing, cyanosis, or edema.  NEUROLOGIC:  Alert and oriented without any motor or sensory deficit.   ASSESSMENT AND PLAN:  1. Back pain with radicular symptoms.  Needs surgical correction.      Patient with multiple medical problems but I think he is medically      optimized.  I do not think any further preoperative intervention is      necessary.  The patient should continue his medications.  It would      be okay with me if he stopped his aspirin temporarily prior to      surgery but should resume shortly after surgery.  2. Hypertension seems well-controlled, especially at home.  Other      medical problems as listed above are stable.  It is worth noting      that the patient receives B12 injections monthly.     Bruce Rexene Edison Swords, MD  Electronically Signed    BHS/MedQ  DD: 04/11/2007  DT: 04/11/2007  Job #: [id number]   cc:   Jetty Duhamel, Fax [phone]

## 2011-04-03 NOTE — Op Note (Signed)
NAME:  Alexander Rice, Alexander Rice NO.:  [id number]   MEDICAL RECORD NO.:  [id number]          PATIENT TYPE:  AMB   LOCATION:  DAY                          FACILITY:  [facility]   PHYSICIAN:  Alvy Beal, MD    DATE OF BIRTH:  1932-01-21   DATE OF PROCEDURE:  05/08/2007  DATE OF DISCHARGE:                               OPERATIVE REPORT   PREOPERATIVE DIAGNOSIS:  Spinal stenosis with left footdrop.   POSTOPERATIVE DIAGNOSIS:  Spinal stenosis with left footdrop.   OPERATIVE PROCEDURE:  X-Stop, L4-5.   HISTORY:  This is a very pleasant, 77 year old gentleman, who has  presented with increasing back, buttock, and hamstring pain, and a left  footdrop since February of 2008.  He states he had just back and  bilateral buttock pain until he had a manipulation by a chiropractor and  then he developed a left footdrop.  He presented to me in May, and I  diagnosed him with neurogenic claudication with nerve irritation.  The  patient did not have true radicular leg pain, he had a negative straight  leg raise test.  The patient's primary complaint was just back and  buttock pain.   After discussing treatment options, I discussed doing an open  decompression versus an X-Stop.  After discussing both the risks,  benefits of both procedures, the patient and I both felt as though the X-  Stop may be beneficial to him.  This would Quetzali Heinle shorter operative  time, ease, as well as recovery.  I did indicate to him that it may not  resolve his footdrop, but it may help with his back pain.  If, however,  it did not then we would have to consider going in, removing the X-Stop,  and doing a formal decompression.  At this point in time, the patient  was consented for the aforementioned procedure.   OPERATIVE NOTE:  The patient was brought to the operating room and  placed supine on the operating table.  After successful induction of  general anesthesia and endotracheal intubation, he was turned  prone onto  a Wilson frame.  All bony prominences were well padded, and the back was  prepped and draped in a standard fashion.   A midline incision starting at the L4-5 spinous process was then made.  Sharp dissection was carried out down to the deep fascia.  The L4  spinous process was palpated, and an incision was made along the lateral  border on the right and left side.  Great care was taken to prevent any  injury to the interspinous process ligament itself.  Using a Cobb  elevator, I dissected the soft tissues from the spinous process of L4  and L5 and exposed out to the facet complex.  This was done bilaterally.  I confirmed the L4-5 spinous process and interspinous process space on  fluoroscopy.  Once confirmed, I then resected a portion of the facet  that was osteophyte that would have prevented me from getting the  prosthesis as far anterior as possible.  I then advanced the first  trocar and then the second and  confirmed position on the AP and lateral  planes.  Once confirmed, I then placed the measuring device into the  intervertebral space and expanded it.  A size 10 provided the best fit.  The interspinous process ligament became taut with the trialing to a 10.  I then took the 10 X-Stop and planted it from right to left and locked  it into position.   At this point in time, the position with the X-Stop properly positioned,  I screwed the wings together and locked them into place.   I irrigated the wound copiously with normal saline, closed the deep  fascia with interrupted #1 Vicryl sutures, #2-0 for the subcutaneous,  and a #3-0 Monocryl for the skin, Steri-strips and a dry dressing were  applied, the patient was  extubated and transferred to the PACU without incident.  At the end of  the case, all needle and sponge counts were correct, the patient was  hemodynamically intact.  He will be admitted and discharged tomorrow  with appropriate followup with me.       Alvy Beal, MD  Electronically Signed     DDB/MEDQ  D:  05/08/2007  T:  05/08/2007  Job:  [id number]

## 2011-04-04 ENCOUNTER — Telehealth: Payer: Self-pay | Admitting: Internal Medicine

## 2011-04-04 DIAGNOSIS — K219 Gastro-esophageal reflux disease without esophagitis: Secondary | ICD-10-CM

## 2011-04-04 MED ORDER — ESOMEPRAZOLE MAGNESIUM 40 MG PO CPDR: 40.0000 mg | DELAYED_RELEASE_CAPSULE | Freq: Every day | ORAL | Status: DC

## 2011-04-04 NOTE — Telephone Encounter (Signed)
rx sent in electronically 

## 2011-04-04 NOTE — Telephone Encounter (Signed)
Pt called and said that he still has not rcvd order from [facility] for Nexium. Pt contacted [facility] and was told that there was no record or script for Nexium placed. Pls call [facility] [phone] asap today.

## 2011-04-06 NOTE — Discharge Summary (Signed)
[facility]  Patient:    Alexander Rice, Alexander Rice                   MRN: [id number] Adm. Date:  [id number] Disc. Date: [id number] Attending:  Judie Petit Dictator:   Cornell Barman, P.A. CC:         Valetta Mole. Swords, M.D. [facility]  Maisie Fus D. [name], M.D. [facility]   Discharge Summary  DISCHARGE DIAGNOSES: 1. Chest pain. 2. Bradycardia.  HISTORY OF PRESENT ILLNESS:  Alexander Rice is a 77 year old white male who presented to Dr. Cato Mulligan office with complaints of left chest pain rated at a 3/10.  He had no associated shortness of breath, nausea, vomiting, or diaphoresis.  He stated the "twinging" lasted 2-1/2 hours.  PAST MEDICAL HISTORY: 1. Coronary artery disease, status post catheterization in December 1999,    that revealed normal LV function with mid 50% LAD, 50-70% mid and distal    circumflex, D1 50% proximally, RCA 30% proximally and 80% mid.  He had a    PTCA and stent to the RCA at that time.  Cardiolite in March 2000. 2. History of sinus bradycardia. 3. Hyperlipidemia. 4. ASPVD, status post right CEA. 5. Status post colonoscopy x 2. 6. History of BPH. 7. History of colon polyps.  HOSPITAL COURSE: #1 - CHEST PAIN WITH HISTORY OF CORONARY ARTERY DISEASE:  The patient was started on heparin and nitroglycerin paste.  Cardiac enzymes were negative. EKG showed no acute ischemia; however, the patient underwent cardiac catheterization on July 11, 2000.  This did not reveal any obstructive coronary artery disease.  Dr. [name] recommended a stress Cardiolite as the diagonal may be worse.  He would only do a PCA if there was significant ischemia.  The patient has had his Cardiolite and, if it is negative, he will be discharged home.  If it is positive, then they favor an elective PCI.  #2 - HYPERTENSION:  The patients blood pressure was poorly controlled, and his ramipril was to be increased to 5 mg b.i.d.  The patient is bradycardic, and his  atenolol has been held.  Apparently, he was on atenolol 25 mg q.i.d. prior to admission.  Not clear if any further evaluation of this bradycardia will be pursued at this time.  He has been maintaining heart rates in the 40s and 50s throughout this hospitalization.  DISCHARGE MEDICATIONS: 1. Altace 5 mg b.i.d. 2. Zocor 20 mg q.d. 3. Cardura 8 mg q.d. 4. Aspirin 325 mg q.d. 5. Atenolol to be held for now.  FOLLOW-UP:  With Dr. Cato Mulligan in two to three weeks, and Dr. [name] as instructed. DD:  07/12/00 TD:  07/14/00 Job: [id number]

## 2011-04-06 NOTE — Consult Note (Signed)
NAME:  PERSEUS, WESTALL NO.:  [id number]   MEDICAL RECORD NO.:  [id number]          PATIENT TYPE:  INP   LOCATION:  4705                         FACILITY:  [facility]   PHYSICIAN:  Jonelle Sidle, M.D. LHCDATE OF BIRTH:  [birth year]   DATE OF CONSULTATION:  08/01/2005  DATE OF DISCHARGE:                                   CONSULTATION   PRIMARY CARE PHYSICIAN:  Valetta Mole. Swords, M.D.   PRIMARY CARDIOLOGIST:  Arturo Morton. [name], M.D.   REASON FOR CONSULTATION:  Chest pain and history of coronary artery disease.   HISTORY OF PRESENT ILLNESS:  Mr. Tomb is a pleasant 77 year old  gentleman with a history of hypertension, hyperlipidemia, and coronary  artery disease status post previous stent placement to the right coronary  artery in December of 1999.  Most recent angiography occurred in 2001  revealing stable coronary anatomy, as outlined below, which has been managed  medically.  Mr. Sommerville is now admitted to the hospital from the office by  Dr. Cato Mulligan complaining of recurrent chest pain that reminds him of prior  angina pain, although it is admittedly fairly atypical in description.  He  describes twinges of left-sided chest pain occurring typically for a few  seconds to less than a minute, in a pattern of recurrence very 5-10 minutes.  This started earlier today without any particular exacerbation, and he  describes it somewhat like a bee sting.  He does state that these symptoms  are similar, however, to what he experienced back in 1999 prior to stent  placement.  He denies any particular active exertional symptoms recently.  He has been fairly active caring for his wife, who is presently nearly bed-  bound following a pelvic fracture back in July.  He exercises at the [facility],  again without typical exertional symptoms.  He was concerned about his  symptoms and mentioned them to Dr. Cato Mulligan, who subsequently admitted the  patient for further assessment.   Electrocardiogram from earlier this morning  shows sinus rhythm, one with a premature atrial complex and nonspecific ST  changes.  Cardiac markers at this time are equivocal with elevated total CK  level of 438, CK-MB of 4.9, but a normal troponin I of 0.01.  The patient is  symptom free at this time.  We have been asked to evaluate further and  suggest additional testing.   ALLERGIES:  No known drug allergies.   PRESENT MEDICATIONS:  1.  Cardura 8 mg p.o. daily.  2.  Altace 5 mg p.o. daily.  3.  Zocor 20 mg p.o. daily.  4.  Nexium 40 mg p.o. daily.  5.  Detrol LA 2 mg p.o. daily.  6.  Aspirin 81 mg p.o. daily.  7.  Vitamin B-12.  8.  Coenzyme Q10.  9.  Flax seed oil.  10. Calcium supplements.   PAST MEDICAL HISTORY:  1.  Coronary artery disease status post stent placement to the right      coronary artery back in 1999.  The most recent angiography performed by      Dr. [name] in August of 2001 revealed  40% luminal irregularities in the      left anterior descending with a large diagonal branch that had a 50% to      70% stenosis, 70% AV circumflex stenosis after the take-off of the      second obtuse marginal, which itself had a 50% to 60% stenosis in the      mid portion, 30% to 40% proximal to mid RCA stenosis, 30% InStent      restenosis, and 40% distal RCA stenosis with an ejection fraction of      70%.  The patient has been managed medically since that time.  2.  Hypertension.  3.  Hyperlipidemia.  4.  Gastroesophageal reflux disease.   SOCIAL HISTORY:  The patient lives in [place].  He cares for his wife,  who is presently essentially bedridden following a pelvic fracture that  occurred in July.  He is retired, and drinks 2-3 glasses of red wine a day.  He denies any significant tobacco use.  [facility] does state [facility]   FAMILY HISTORY:  Significant for stroke in the patient's father who died at  age 66, and pneumonia in the  patient's mother who died at age 31.  He does  have one sister with coronary artery disease.   REVIEW OF SYSTEMS:  As described in the history of present illness.  He has  chronic problems with nocturia, lower back pain, and reflux symptoms.  Systems otherwise negative.   PHYSICAL EXAMINATION:  VITAL SIGNS:  Temperature is 97.5 degrees, heart rate  69, respirations 18, blood pressure 163/75, oxygen saturation 97% on room  air.  GENERAL:  This is a well-nourished male in no acute distress, denying active  chest pain.  HEENT:  Conjunctivae and lids normal.  Oropharynx is clear.  NECK:  Supple without elevated jugular venous pressure.  No carotid bruit or  thyromegaly is noted.  LUNGS:  Clear to auscultation with non-labored breathing at rest.  CARDIAC:  A regular rate and rhythm without loud murmur or S3 gallop.  There  is no pericardial rub noted.  ABDOMEN:  Soft and nontender with normoactive bowel sounds.  EXTREMITIES:  No pitting edema.  Peripheral pulses are 2+.  SKIN:  No ulcerative changes noted.  MUSCULOSKELETAL:  No kyphosis noted.  NEUROPSYCHIATRIC:  The patient is alert and oriented x3.  Affect is normal.   LABORATORY DATA:  WBCs of 6.5, hemoglobin 12.1, platelets 286, sodium 142,  potassium 4.6, BUN 19, creatinine 1.4, glucose 113.  CK of 438, CK-MB of  4.9, troponin I of 0.01, INR of 1.0.   IMPRESSION:  1.  Recurrent chest pain, fairly atypical in description; however,      reminiscent to the patient of prior anginal symptoms preceding stent      placement to the right coronary artery in 1999.  He has had no typical      exertional symptoms leading up to this.  Initial cardiac markers with      equivocal with elevated CK and CK-MB levels, although normal relative      index and normal troponin I levels.  Electrocardiogram shows nonspecific      changes.  He has not had an follow up ischemic testing since 2001.  2.  Hypertension. 3.  Hyperlipidemia.  4.  Status post  right carotid endarterectomy.   RECOMMENDATIONS:  1.  I discussed the situation with the patient including further available  diagnostic tests from both a non-invasive and invasive perspective.      After reviewing the risks and benefits, the plan is to proceed tomorrow      with diagnostic coronary angiography to clearly outline the coronary      anatomy and assess for any progression.  He is agreeable to proceed.      This will be scheduled with Dr. Juanda Chance.  2.  Continue to cycle cardiac markers.  Would maintain home regimen with the      addition of treatment does Lovenox.  3.  Further plans to follow.           ______________________________  Jonelle Sidle, M.D. [facility]     SGM/MEDQ  D:  08/01/2005  T:  08/01/2005  Job:  [id number]   cc:   Valetta Mole. Swords, M.D. [facility]   Maisie Fus D. [name], M.D. [facility]  [address]  [address]  [place]  [place] [address]

## 2011-04-06 NOTE — Cardiovascular Report (Signed)
NAME:  Alexander Rice, Alexander Rice NO.:  [id number]   MEDICAL RECORD NO.:  [id number]          PATIENT TYPE:  INP   LOCATION:  4705                         FACILITY:  [facility]   PHYSICIAN:  Arturo Morton. [name], M.D. [facility] OF BIRTH:  April 30, 1932   DATE OF PROCEDURE:  08/02/2005  DATE OF DISCHARGE:                              CARDIAC CATHETERIZATION   INDICATIONS:  Mr. Chrostowski is a delightful 77 year old gentleman who we  previously placed a stent in the distal right coronary artery. He came into  the office for a physical examination the other day, and developed some  pain and was subsequently transferred to [facility].  Enzymes have been  negative. He was brought to the catheterization lab for further evaluation  and treatment.   PROCEDURES:  1.  Left heart catheterization.  2.  Selective coronary arteriography.  3.  Selective left ventriculography.  4.  Distal aortography.   DESCRIPTION OF PROCEDURE:  The patient was brought to the catheterization  laboratory, prepped and draped in the usual fashion.  Through an anterior  puncture the right femoral artery was entered. We had difficulty getting the  J-wire beyond the mid aorta, so therefore a right coronary artery catheter  was used to guide the guide wire up into the central aorta and around the  root. Following this, coronary arteriography was performed without  complication in the right coronary territory.  Left coronary arteriography  was then performed, also without complication. Central aortic and left  ventricular pressures were measured with a pigtail. Ventriculography was  formed in the RAO projection. Following a pressure pullback, distal  aortography was also performed. He tolerated the procedure well and there  were no complications. He was given labetalol 10 mg and an additional dose  of Vasotec to bring the blood pressure down.   HEMODYNAMIC DATA.:  1.  Central aortic pressure:  180/67  2.  Left ventricular  pressure:  195/20.  3.  Approximately a 10 mm gradient on pullback across the aortic valve.   ANGIOGRAPHIC DATA:  1.  RIGHT CORONARY ARTERY:  The right coronary artery is a moderately      calcified vessel. There is about a 50-60% area of segmental plaquing at      the junction of the proximal and mid vessel. However, the minimum lumen      diameter appears to be in excess of 2 mm easily. Following this, there      is a segmental  30-50% area of narrowing in the mid artery.  The vessel      has a deep turn and there is a previously placed stent.  The stent is      widely patent, without significant re-narrowing.  Just beyond the stent      there is an area of about 30-40% narrowing before the PDA takeoff; this      is calcified. The PDA and posterolateral system, which consisted      predominantly of two branches, is without critical narrowing.  2.  LEFT MAIN CORONARY ARTERY:  The left main is calcified but without  significant focal narrowing.  3.  CIRCUMFLEX CORONARY ARTERY:  The circumflex provides a small-caliber      marginal branch,  followed by an AV circumflex.  In the bend of the AV      circumflex is about 50% narrowing, and in the second bend another 50-70%      area of narrowing. This circumflex vessel appears to be probably just      diffusely diseased, but without a specific focal obstruction.  4.  LEFT ANTERIOR DESCENDING ARTERY:  The proximal left anterior descending      artery is heavily calcified. There is 40% narrowing in the LAD after the      takeoff of the major diagonal branch.  The vessel is small in caliber,      with diffuse luminal irregularity throughout the remainder of the vessel      down to the apex. The diagonal is a large-caliber vessel; it is also      calcified. There is about 40% ostial narrowing  and then about a 50-70%      area of segmental narrowing in the mid portion of the diagonal branch.   VENTRICULOGRAPHY:  Ventriculography is done in  the RAO projection. Overall  systolic function appears reasonably well preserved.  Ejection fraction  appears to be about 55%, without significant mitral regurgitation.   AORTOGRAPHY:  The aorta is calcified. There is mild atherosclerotic change  in the infrarenal abdominal aorta. There is some mild narrowing also of the  right iliac, but it is not flow limiting.  The renal arteries appear to be  patent bilaterally.   CONCLUSION:  1.  Preserved overall left ventricular function.  2.  Continued patency of the right coronary artery stent.  3.  Scattered three-vessel coronary abnormalities, as described above.   DISPOSITION:  Based upon the above findings, at the present time I would  lean towards continued medical therapy. There is not one clear focal spot  that is clearly responsible for the findings. Based on this, I would likely  not change his therapies.  His symptoms are somewhat atypical.  There was a  mild gradient across the aortic valve, and we will get an echocardiogram to  confirm this.   Continued follow-up with Dr. Cato Mulligan is recommended.      Arturo Morton. [name], M.D. [facility]  Electronically Signed     TDS/MEDQ  D:  08/02/2005  T:  08/02/2005  Job:  [id number]   cc:   Valetta Mole. Swords, M.D. [facility]  [address] [place]  [place] [address]

## 2011-04-06 NOTE — H&P (Signed)
NAME:  Alexander Rice, Alexander Rice NO.:  [id number]   MEDICAL RECORD NO.:  [id number]          PATIENT TYPE:  INP   LOCATION:  4705                         FACILITY:  [facility]   PHYSICIAN:  Valetta Mole. Swords, M.D. [facility] OF BIRTH:  1932/07/10   DATE OF ADMISSION:  08/01/2005  DATE OF DISCHARGE:                                HISTORY & PHYSICAL   CHIEF COMPLAINT:  Chest pain.   HISTORY OF PRESENT ILLNESS:  Mr. Nuttall came to the office today for a  physical examination.  A complete physical examination was performed in the  office.  At the end of that examination and finalization, he started  complaining of chest discomfort.  He states he has had twinges of chest  discomfort over the past 45 minutes.  He says it is substernal, feels like a  tight feeling.  It does wax and wane but has not resolved in that period of  time.  He admits to no shortness of breath, no nausea or vomiting, no  diaphoresis.   PAST MEDICAL HISTORY:  1.  Hypertension.  2.  Carotid vascular disease.  3.  Hyperlipidemia.  4.  Coronary artery disease.  5.  Gastroesophageal reflux disease.  6.  He has had a carotid endarterectomy.  7.  He has had a stent placed for coronary artery disease in 1999.  8.  He has had an angiogram in 2001.  9.  In March 2004, he had an injury requiring quadriceps muscle      reattachment.   CURRENT MEDICATIONS:  1.  Cardura 8 mg p.o. daily.  2.  Aspirin 325 mg daily.  3.  Vitamin B12 injections monthly.  4.  Zocor 20 mg p.o. daily.  5.  Altace 5 mg p.o. daily.  6.  Vesicare 5 mg p.o. q.h.s.  7.  Prilosec 20 mg p.o. daily.   SOCIAL HISTORY:  He is married.  He is a nonsmoker.  He drinks occasional  alcohol.   FAMILY HISTORY:  Noncontributory.   REVIEW OF SYSTEMS:  As above.  He denies any other complaints in the  complete review of systems.   PHYSICAL EXAMINATION:  VITAL SIGNS:  Pulse 74, respirations 12, blood  pressure 200/90.  That was rechecked at 160/85.  GENERAL:  He appears a well-developed, well-nourished male in no acute  distress.  HEENT:  Atraumatic, normocephalic, extraocular movements are intact.  NECK:  Supple without lymphadenopathy, thyromegaly, jugular venous  distention or carotid bruits.  CHEST:  Clear to auscultation without any increased work of breathing.  CARDIAC:  S1, S2 are normal without murmurs or gallops.  ABDOMEN:  Active bowel sounds, soft, nontender.  There is no  hepatosplenomegaly.  NEUROLOGIC:  He is alert and oriented without any motor or sensory deficits.   EKG demonstrates normal sinus rhythm with PAC.   Laboratories:  TSH normal.  PSA normal.  CBC normal.  Cholesterol 136, HDL  49.7, LDL 66.  CMET normal.   ASSESSMENT AND PLAN:  1.  Chest pain.  Patient with coronary artery disease and significant      hypertension in the office.  He  needs admission and transfer by      ambulance.  Will continue current medications.  Will start subcu      Lovenox.  Will rule out for myocardial infarction and have cardiology      see the patient in the hospital.      [name], M.D. [facility]  Electronically Signed     BHS/MEDQ  D:  08/01/2005  T:  08/01/2005  Job:  [id number]

## 2011-04-06 NOTE — Discharge Summary (Signed)
NAME:  Alexander Rice, Alexander Rice NO.:  [id number]   MEDICAL RECORD NO.:  [id number]          PATIENT TYPE:  INP   LOCATION:  4711                         FACILITY:  [facility]   PHYSICIAN:  Rene Paci, M.D. [facility] OF BIRTH:  1932-06-20   DATE OF ADMISSION:  08/01/2005  DATE OF DISCHARGE:  08/03/2005                                 DISCHARGE SUMMARY   DISCHARGE DIAGNOSES:  1.  Atypical chest pain with non-obstructive coronary artery disease status      post catheterization performed on August 02, 2005 by Dr. [name].  2.  Anemia.  Patient receives monthly B12 injections.  3.  Asymptomatic bradycardia.   HISTORY OF PRESENT ILLNESS:  Patient presented to his primary care on  August 01, 2005 for a routine physical examination.  At the completion of  the examination patient reported chest discomfort.  Patient has a history of  coronary artery disease and significant hypertension and was transferred to  [facility] for admission and further evaluation.   PAST MEDICAL HISTORY:  1.  Hypertension.  2.  Carotid vascular disease.  3.  Hyperlipidemia.  4.  Coronary artery disease.  5.  Gastroesophageal reflux disease.  6.  History of carotid endarterectomy.  7.  Status post stent placement for coronary artery disease 1999, status      post angiogram 2001.  8.  Status post quadriceps reattachment March 2004 after injury.   HOSPITAL COURSE:  #1 - ATYPICAL CHEST PAIN:  Patient was admitted and  underwent serial cardiac enzymes.  Cardiac enzymes were negative x3.  A  cardiology consult was obtained secondary to patient's history of coronary  artery disease.  A cardiac catheterization was performed on August 02, 2005 by Dr. [name] which revealed patent RCA stents and scattered three  vessel disease with minimal progression.  A 2-D echocardiogram was performed  on August 03, 2005.  At the time of this dictation results are not yet  available.   Patient was  noted during this hospitalization to have bradycardia down to a  rate of 39.  Cardiology is aware of this.  This bradycardia was asymptomatic  and patient is not on any rate lowering agents.  At this time plan to avoid  beta blocker.  Patient was instructed to contact Dr. Cato Mulligan should he become  symptomatic.  Patient's blood pressure at time of discharge is 141/55.  Plan  to continue current home blood pressure medications.  Per Dr. [name]  patient is stable for discharge from a cardiac standpoint and would favor  continuing current medications.  However, he might add nitrate if symptoms  continue.   #2 - ANEMIA:  Patient was noted to have hemoglobin 11.4, hematocrit 32.3.  Per admission H&P patient receives monthly B12 injections.  Will need  continued outpatient monitoring.   DISCHARGE MEDICATIONS:  1.  Aspirin 325 mg p.o. daily.  2.  Zocor 20 mg p.o. q.h.s.  3.  Cardura 8 mg p.o. daily.  4.  Altace 5 mg p.o. daily.  5.  Travatan eye drops 0.004% solution one drop to each eye daily.  6.  Monthly  B12 injections as before.  7.  Vesicare 5 mg p.o. q.h.s.  8.  Nexium 40 mg p.o. daily.   DISCHARGE LABORATORIES:  BUN 11, creatinine 1.5.  Hemoglobin 11.4,  hematocrit 32.3.   FOLLOW-UP:  A follow-up appointment has been scheduled for patient to see  Dr. Birdie Sons, his primary care on Monday, September 25 at 11:40 a.m.  Patient was instructed to call Dr. Cato Mulligan should he develop shortness of  breath, chest pain, dizziness, or lightheadedness.      Melissa S. Peggyann Juba, NP      Rene Paci, M.D. [facility]  Electronically Signed    MSO/MEDQ  D:  08/03/2005  T:  08/03/2005  Job:  [id number]   cc:   Arturo Morton. [name], M.D. [facility]  [address]  [address]  [place]  [place] [address]

## 2011-04-06 NOTE — Cardiovascular Report (Signed)
[place]  Patient:    Alexander Rice, Alexander Rice                   MRN: [id number] Proc. Date: 07/11/00 Adm. Date:  [id number] Attending:  Judie Petit CC:         Valetta Mole. Swords, M.D. [facility]  [place] [facility]   Cardiac Catheterization  INDICATION:  The patient is a 77 year old who presented with chest pain; however, he does high levels of exercises and has not been having significant chest pain.  Approximately 18 months ago, he underwent percutaneous intervention of the distal right coronary artery and has been clinically well since.  The current study is done to assess coronary anatomy after his presentation with recurrent chest pain.  PROCEDURE 1. Left heart catheterization. 2. Selective coronary arteriography. 3. Selective left ventriculography.  HEMODYNAMIC DATA:  The central aortic pressure is 170/76; LV 181/20.  No gradient on pullback across the aortic valve.  ANGIOGRAPHIC DATA:  On plain fluoroscopy, there is significant calcification of the coronary arteries.  The proximal LAD has a fair amount of calcification but no high-grade area of disease.  Just beyond the bifurcation with the diagonal branch, the LAD demonstrates some diffuse luminal irregularities with stenosis of about 40%. This vessel tapers, thus supplying the anterolateral septum, but appears to be without critical disease.  The large diagonal branch has about 50-70% segmental plaquing throughout its midportion.  This does not appear to be dramatically progressed from the previous study but also is clearly abnormal. The distal vessel is a fairly large distribution vessel.  The circumflex is also very tortuous.  It has an angulated takeoff from the left main.  There is a tiny first marginal that has no significant disease but is an insignificant vessel.  There is a second marginal branch; this curls under the A-V circumflex and is a relatively small vessel and  without critical disease.  The A-V circumflex itself provides a third marginal branch.  This has about 70% narrowing just after the takeoff of the second marginal branch and then diffuse 50-60% narrowing throughout its midportion.  Again, this does not appear to be dramatically changed from the previous study.  The right coronary artery is a highly tortuous vessel; it is also fairly calcified.  There is about 30-40% narrowing in the proximal mid-junction region with some segmental plaquing throughout this area.  Just beyond this, there is a large area of tortuosity and then the stent is visible.  There is perhaps 30% narrowing in the stent but there does not appear to be a high-grade stenosis and the stenosis from the pre-intervention angiogram is dramatically improved.  There is about 40% narrowing distal to this area.  The PAD and posterolateral system are without critical disease.  Ventriculography in the RAO projection revealed preserved global systolic function.  Ejection fraction was calculated at 70%.  CONCLUSIONS 1. Preserved left ventricular function. 2. No evidence of significant restenosis. 3. Scattered lesions of the left circumflex artery which do not appear to be    dramatically changed from the previous study.  If there is a change, it    would be mild, involving primarily the diagonal branch. DD:  07/11/00 TD:  07/12/00 Job: [id number]

## 2011-04-06 NOTE — Op Note (Signed)
NAME:  Alexander Rice, Alexander Rice NO.:  [id number]   MEDICAL RECORD NO.:  [id number]                   PATIENT TYPE:  AMB   LOCATION:  DAY                                  [facility]ACILITY[facility]  [facility]   PHYSICIAN:  Almedia Balls. Ranell Patrick, M.D.              DATE OF BIRTH:  Mar 17, 1932   DATE OF PROCEDURE:  01/21/2003  DATE OF DISCHARGE:                                 OPERATIVE REPORT   PREOPERATIVE DIAGNOSIS:  Right quadriceps tear.   POSTOPERATIVE DIAGNOSES:  Right quadriceps tear.   PROCEDURE PERFORMED:  Open repair of quadriceps tendon rupture.   ATTENDING SURGEON:  Almedia Balls. Ranell Patrick, M.D.   FIRST ASSISTANT:  Dorie Rank, P.A.   ANESTHESIA:  General.   ESTIMATED BLOOD LOSS:  None.   TOURNIQUET TIME:  40 min.   COUNTS:  Instrument count was correct.   COMPLICATIONS:  None.   MEDICATIONS:  Preoperative antibiotics were given.   INDICATIONS:  The patient is a 77 year old male who presents after injuring  his right leg two days ago.  The patient states that he fell and sustained  extremely painful injury to his knee and he was unable to stand afterwards.  The patient presented to the orthopedic office with a clinically torn  quadriceps tendon, with the inability to fully extend his knee.  The patient  had carpal defect present in the quadriceps tendon.  Due to the disruption  in the extensor mechanism, the patient was counseled for the need for  surgery to reconstruct his extensor mechanism.   DESCRIPTION OF PROCEDURE:  After an adequate level of anesthesia achieved,  the patient presented supine on the operating room table.  Ancef 1 g had  been given preoperatively.  After sterile prep and drape, the right leg was  exsanguinated using Esmarch bandage.  The tourniquet was elevated to 300  mmHg.  A longitudinal skin incision was created overlying the quadriceps  tendon in the patella.  This was taken sharply down through the superficial  fascia.  There was noted to be  a completely torn quadriceps tendon.  The  remainder of the tendon attachment to the patella was removed, and the  patella was freshened up utilizing rongeur to bleeding bone, to facilitate  the reattachment of his quadriceps to the patella.  The retinaculum was torn  medially and laterally.  At this point, two #2 fiber wire sutures were  placed in a __________ fashion into the quadriceps tendon, getting excellent  purchase on the tendon, leaving four strands distally.  Three drill holes  were placed in the patella, proximal to distal, and utilizing suture passed  where the sutures were placed through the drill holes and then tied in a  horizontal mattress fashion under the tendon distally.  An excellent repair  was noted.   At this point the retinacular repair was performed, after irrigation of the  knee.  Both retinacular were repaired using 0  Ethibond suture in a CT-1  needle in a running fashion.  With the retinacular repaired and the  quadriceps extensor mechanism repaired, the knee was flexed over the side of  the table.  It was noted to flex easily to 45 degrees without any gapping at  the repair site.  After this, some more irrigation was performed, followed  by 2-0 closure of the subcutaneous and 4-0 running Monocryl through the  skin.  Steri-Strips were applied, followed by a sterile dressing and knee  immobilizer.   The patient tolerated the surgery well and was awakened, taken to the  recovery room in stable condition.                                                Almedia Balls. Ranell Patrick, M.D.    SRN/MEDQ  D:  01/21/2003  T:  01/21/2003  Job:  [id number]

## 2011-04-06 NOTE — Assessment & Plan Note (Signed)
[facility]                         GASTROENTEROLOGY OFFICE NOTE   RAYLYN, SPECKMAN                   MRN:          [id number]  DATE:03/03/2007                            DOB:          1932/05/08    Mr. Liwanag complains of worsening reflux symptoms and the new onset of  solid-food dysphagia mainly with meats and some pills. His symptoms have  been present for several weeks. Endoscopy in March of 2006 revealed a  distal esophageal stricture, a hiatal hernia and erosive esophagitis.  Biopsy of the stricture showed changes consistent with reflux  esophagitis. He was not having dysphagia at that time, and the stricture  was not dilated. He is due for colonoscopy due to a personal history of  adenomatous colon polyps. He has noted worsening constipation over the  past year or so that has responded well to daily prune juice and stool  softeners. He notes no change in stool caliber, hematochezia, melena,  abdominal pain, rectal pain, or change in bowel habits or weight loss.   CURRENT MEDICATIONS:  Listed on the chart, updated and reviewed.   MEDICATION ALLERGIES:  None known.   PHYSICAL EXAMINATION:  No acute distress. Weight 184 pounds, blood  pressure is 114/66, pulse 80 and regular.  CHEST:  Clear to auscultation bilaterally.  CARDIAC:  Regular rate and rhythm without murmurs appreciated.  ABDOMEN:  Soft, nontender, nondistended. Normal active bowel sounds. No  palpable organomegaly, masses or hernias.  RECTAL:  Deferred until the time of colonoscopy.  EXTREMITIES:  Without clubbing, cyanosis, or edema.  NEUROLOGICAL:  Alert and oriented x3. Grossly nonfocal.   ASSESSMENT AND PLAN:  1. Solid-food dysphagia with worsening reflux symptoms. Known      esophageal stricture. Increase omeprazole to 40 mg p.o. q.a.m.,      taken one-half hour before breakfast. Reintensify all anti-reflux      measures. Risks, benefits and alternatives to upper  endoscopy with      possible dilation and possible biopsy discussed with patient, and      he consents to proceed. This will be scheduled electively.  2. Personal history of adenomatous colon polyps. Initial diagnosis in      April of 1997 with an adenomatous polyp measuring 1.8 cm. Risks,      benefits and alternatives to colonoscopy with possible biopsy and      possible polypectomy discussed with the patient, and he consents to      proceed. This will be scheduled electively.  3. Mild constipation. Continue high-fiber diet. Increase fluids and      stool softeners as needed.     Venita Lick. Russella Dar, MD, [facility]  Electronically Signed   MTS/MedQ  DD: 03/05/2007  DT: 03/05/2007  Job #: [id number]

## 2011-04-11 ENCOUNTER — Other Ambulatory Visit: Payer: Self-pay | Admitting: Hematology & Oncology

## 2011-04-11 ENCOUNTER — Encounter (HOSPITAL_BASED_OUTPATIENT_CLINIC_OR_DEPARTMENT_OTHER): Payer: Medicare Other | Admitting: Hematology & Oncology

## 2011-04-11 ENCOUNTER — Telehealth: Payer: Self-pay | Admitting: Internal Medicine

## 2011-04-11 DIAGNOSIS — D510 Vitamin B12 deficiency anemia due to intrinsic factor deficiency: Secondary | ICD-10-CM

## 2011-04-11 DIAGNOSIS — K219 Gastro-esophageal reflux disease without esophagitis: Secondary | ICD-10-CM

## 2011-04-11 DIAGNOSIS — D649 Anemia, unspecified: Secondary | ICD-10-CM

## 2011-04-11 DIAGNOSIS — N289 Disorder of kidney and ureter, unspecified: Secondary | ICD-10-CM

## 2011-04-11 LAB — CBC WITH DIFFERENTIAL (CANCER CENTER ONLY)
BASO%: 0.2 % (ref 0.0–2.0)
EOS%: 3.5 % (ref 0.0–7.0)
LYMPH%: 32.7 % (ref 14.0–48.0)
MCH: 34.5 pg — ABNORMAL HIGH (ref 28.0–33.4)
MCV: 101 fL — ABNORMAL HIGH (ref 82–98)
MONO%: 8 % (ref 0.0–13.0)
Platelets: 293 10*3/uL (ref 145–400)
RDW: 13.4 % (ref 11.1–15.7)

## 2011-04-11 LAB — TECHNOLOGIST REVIEW CHCC SATELLITE

## 2011-04-11 MED ORDER — ESOMEPRAZOLE MAGNESIUM 40 MG PO CPDR: 40.0000 mg | DELAYED_RELEASE_CAPSULE | Freq: Every day | ORAL | Status: DC

## 2011-04-11 NOTE — Telephone Encounter (Signed)
Pt says that he still has not rcvd his meds from [facility]. Pt is wondering why he has not been called and why med was not sent in. Pt says that this is a new med that Dr Cato Mulligan prescribed. Pt does not know the name of medication.

## 2011-04-11 NOTE — Telephone Encounter (Signed)
Sent rx in on 04/04/11.  Pt said he has still not received meds.  Resent rx and told pt to call [facility] and verify that they got it.

## 2011-04-12 LAB — RETICULOCYTES (CHCC)
RBC.: 3.18 MIL/uL — ABNORMAL LOW (ref 4.22–5.81)
Retic Ct Pct: 1 % (ref 0.4–3.1)

## 2011-04-12 LAB — IRON AND TIBC
%SAT: 68 % — ABNORMAL HIGH (ref 20–55)
TIBC: 267 ug/dL (ref 215–435)
UIBC: 86 ug/dL

## 2011-04-24 ENCOUNTER — Ambulatory Visit (INDEPENDENT_AMBULATORY_CARE_PROVIDER_SITE_OTHER): Payer: Medicare Other | Admitting: Internal Medicine

## 2011-04-24 DIAGNOSIS — D518 Other vitamin B12 deficiency anemias: Secondary | ICD-10-CM

## 2011-04-25 ENCOUNTER — Telehealth: Payer: Self-pay | Admitting: *Deleted

## 2011-04-25 ENCOUNTER — Emergency Department (HOSPITAL_COMMUNITY): Payer: Medicare Other

## 2011-04-25 ENCOUNTER — Inpatient Hospital Stay (HOSPITAL_COMMUNITY)
Admission: EM | Admit: 2011-04-25 | Discharge: 2011-04-26 | DRG: 287 | Disposition: A | Discharge status: Home / Self Care | Payer: Medicare Other | Attending: Cardiology | Admitting: Cardiology

## 2011-04-25 DIAGNOSIS — R079 Chest pain, unspecified: Secondary | ICD-10-CM

## 2011-04-25 DIAGNOSIS — I2510 Atherosclerotic heart disease of native coronary artery without angina pectoris: Secondary | ICD-10-CM | POA: Diagnosis present

## 2011-04-25 DIAGNOSIS — E538 Deficiency of other specified B group vitamins: Secondary | ICD-10-CM | POA: Diagnosis present

## 2011-04-25 DIAGNOSIS — E785 Hyperlipidemia, unspecified: Secondary | ICD-10-CM | POA: Diagnosis present

## 2011-04-25 DIAGNOSIS — Z7982 Long term (current) use of aspirin: Secondary | ICD-10-CM

## 2011-04-25 DIAGNOSIS — I129 Hypertensive chronic kidney disease with stage 1 through stage 4 chronic kidney disease, or unspecified chronic kidney disease: Secondary | ICD-10-CM | POA: Diagnosis present

## 2011-04-25 DIAGNOSIS — R0789 Other chest pain: Principal | ICD-10-CM | POA: Diagnosis present

## 2011-04-25 DIAGNOSIS — Z9861 Coronary angioplasty status: Secondary | ICD-10-CM

## 2011-04-25 DIAGNOSIS — N183 Chronic kidney disease, stage 3 unspecified: Secondary | ICD-10-CM | POA: Diagnosis present

## 2011-04-25 LAB — POCT I-STAT, CHEM 8
Calcium, Ion: 1.14 mmol/L (ref 1.12–1.32)
Glucose, Bld: 101 mg/dL — ABNORMAL HIGH (ref 70–99)
HCT: 31 % — ABNORMAL LOW (ref 39.0–52.0)
Hemoglobin: 10.5 g/dL — ABNORMAL LOW (ref 13.0–17.0)
TCO2: 24 mmol/L (ref 0–100)

## 2011-04-25 LAB — CARDIAC PANEL(CRET KIN+CKTOT+MB+TROPI)
Total CK: 129 U/L (ref 7–232)
Troponin I: <0.3 ng/mL (ref <0.30)

## 2011-04-25 LAB — CBC
HCT: 30.5 % — ABNORMAL LOW (ref 39.0–52.0)
Hemoglobin: 10.5 g/dL — ABNORMAL LOW (ref 13.0–17.0)
MCHC: 34.4 g/dL (ref 30.0–36.0)
MCV: 98.7 fL (ref 78.0–100.0)

## 2011-04-25 LAB — DIFFERENTIAL
Basophils Absolute: 0 10*3/uL (ref 0.0–0.1)
Lymphocytes Relative: 24 % (ref 12–46)
Lymphs Abs: 0.9 10*3/uL (ref 0.7–4.0)
Monocytes Absolute: 0.3 10*3/uL (ref 0.1–1.0)
Neutro Abs: 2.4 10*3/uL (ref 1.7–7.7)

## 2011-04-25 LAB — CK TOTAL AND CKMB (NOT AT ARMC)
CK, MB: 5.2 ng/mL — ABNORMAL HIGH (ref 0.3–4.0)
Relative Index: 3 — ABNORMAL HIGH (ref 0.0–2.5)

## 2011-04-25 LAB — TSH: TSH: 2.676 u[IU]/mL (ref 0.350–4.500)

## 2011-04-25 NOTE — Telephone Encounter (Signed)
Pt was having some chest discomfort every 15 seconds.  He said he would not consider it pain but he had to stents placed 14 years ago.  I told pt to the ER because if it was something serious they had the proper equipment to take of him and we do not.  Pt agreed and is going to the ER

## 2011-04-26 ENCOUNTER — Encounter: Payer: Self-pay | Admitting: *Deleted

## 2011-04-26 LAB — LIPID PANEL
Cholesterol: 133 mg/dL (ref 0–200)
HDL: 77 mg/dL (ref >39)
LDL Cholesterol: 37 mg/dL (ref 0–99)
Total CHOL/HDL Ratio: 1.7 RATIO

## 2011-04-26 LAB — BASIC METABOLIC PANEL
Chloride: 108 mEq/L (ref 96–112)
GFR calc Af Amer: >60 mL/min (ref >60)
Potassium: 3.6 mEq/L (ref 3.5–5.1)

## 2011-04-26 LAB — CARDIAC PANEL(CRET KIN+CKTOT+MB+TROPI): CK, MB: 3 ng/mL (ref 0.3–4.0)

## 2011-05-03 NOTE — H&P (Addendum)
NAME:  Alexander Rice, Alexander Rice NO.:  [id number]  MEDICAL RECORD NO.:  [id number]  LOCATION:  3735                         FACILITY:  [facility]  PHYSICIAN:  Alexander Rice, M.D.DATE OF BIRTH:  1932/07/17  DATE OF ADMISSION:  04/25/2011 DATE OF DISCHARGE:                             HISTORY & PHYSICAL   PRIMARY CARDIOLOGIST:  Alexander Morton. [name], MD, [facility]  PRIMARY MEDICAL DOCTOR:  Alexander Mole. Swords, MD  CHIEF COMPLAINT:  Chest pain.  HISTORY OF PRESENT ILLNESS:  Alexander Rice is a 77 year old gentleman with a history of CAD, bradycardia, anemia, hyperlipidemia, and hypertension who has not been formally seen in the office in approximately 12 years.  He has a history of RCA stenting in 1999, last catheterization was in 2006 showing scattered moderate nonobstructive CAD, on medical therapy.  He has been doing fairly well.  He does not exert himself secondary to back problems, but is able to do steps and move along without any symptoms.  However, this morning, approximately 9:15 a.m., he was performing his everyday waking up routine, developed a little pain sensation in his chest, lasting seconds at a time on and off about 10 times throughout the duration of an hour.  It resolved spontaneously in the ER, after having been sent from his primary care doctor's office.  Particularly, the sensation is not made worse by anything, nor is there episodes of shortness of breath, diaphoresis, or nausea.  In 1999, the only difference is that he did have diaphoresis, but is insistent that this pain is what he experienced before requiring his RCA stent at that time.  He has not been evaluated since 2006.  His first set of cardiac enzymes showed an MB of 5.2 with a relative index of 3, but negative troponin thus far.  EKG shows normal sinus rhythm, new T wave inversion in lead III.  PAST MEDICAL HISTORY: 1. CAD.     a.     Status post RCA stenting in 1999.     b.     Nonobstructive  moderate CAD by cath in 2006. 2. B12 deficiency anemia, followed by Alexander Rice. 3. Bradycardia. 4. Hypertension. 5. Carotid disease status post right carotid endarterectomy. 6. Hyperlipidemia. 7. Renal insufficiency with creatinine 1.5 today. 8. Prior back surgery. 9. Prior quadriceps muscle reattachment. 10.Normal LV function with EF of 55% by cath in 2006.  MEDICATIONS: 1. Fish oil one capsule every morning. 2. Lumigan eyedrops 1 drop daily nightly. 3. Multivitamin 1 tablet daily. 4. Vitamin B12 injection one injection every 30 days. 5. Potassium chloride 20 mEq every morning. 6. Oxybutynin 5 mg daily. 7. Omeprazole 20 mg b.i.d. 8. Nitroglycerin sublingual 0.4 mg every 5 minutes as needed up to 3     doses for chest pain. 9. Imdur 30 mg daily nightly. 10.Furosemide 20 mg daily. 11.Doxazosin 8 mg daily. 12.Aspirin 81 mg 2 tablets daily. 13.Alphagan 1 drop t.i.d. both eyes. 14.Simvastatin 40 mg nightly.  ALLERGIES:  No known drug allergies.  SOCIAL HISTORY:  Alexander Rice lives in [place].  He will have been married 51 years this October.  He is retired, but was previously in [employer].  He has 3 children.  He never  used tobacco products.  He drinks occasional glass of red or white wine.  FAMILY HISTORY:  Mother died of lung disease in her 81s and father died of CVA in his 41s.  He does have one sister with coronary artery disease.  REVIEW OF SYSTEMS:  No fevers, chills, cough, shortness of breath, dyspnea on exertion, nausea, vomiting, diarrhea, melena, hematemesis, hematuria.  He is under the care of his PCP for epigastric discomfort from time to time, but denies any pain.  He is taking Prilosec currently.  LABORATORY DATA:  WBC 3.7, hemoglobin 10.5, hematocrit 30.5, platelet count 288.  Sodium 140, potassium 4.3, chloride 108, glucose 101, BUN 24, creatinine 1.50.  CK 175, MB 5.2, troponin negative.  RADIOLOGIC STUDIES:  Chest x-ray showed large cardiac  silhouette with mild bibasilar atelectasis.  PHYSICAL EXAMINATION:  VITAL SIGNS:  Temperature 98.2, pulse 52, respirations 11, blood pressure 152/66, pulse ox 99% on room air. GENERAL:  This is a pleasant, delightful elderly white male in no acute stress. HEENT:  Normocephalic, atraumatic.  Extraocular movements intact.  Clear sclerae.  Nares are without discharge. NECK:  Supple without carotid bruits.  He does have a well-healed right neck scar. HEART:  Auscultation of the heart reveals regular rhythm, bradycardic with S1, S2 without murmurs, rubs, or gallops. LUNGS:  Clear to auscultation bilaterally without wheezes, rales, or rhonchi. ABDOMEN:  Soft, nontender, nondistended with positive bowel sounds. EXTREMITIES:  Warm, dry without edema. NEUROLOGICAL:  He is alert and oriented x3, responds to questions appropriately with a normal affect.  ASSESSMENT/PLAN:  The patient was seen and examined by Dr. Deborah Rice and myself.  This is a very pleasant 77 year old gentleman with a history of CAD status post RCA stenting in 1999 with moderate nonobstructive disease in 2006, B12 deficiency anemia, bradycardia, hypertension, and renal insufficiency who presents to [facility] with complaints of chest pain.  The pain itself is somewhat atypical in nature, described as a stinging sensation with no associated factors.  However, his EKG is changed from 2009 and first set of cardiac enzymes do indicate an elevated relative index.  The most striking part of this story is that this pain is exactly what he experienced prior to requiring an RCA stent in 1999.  Given the fact he has not had any evaluation in 2006, we feel that he may benefit from cardiac catheterization to define his anatomy.  We will cycle cardiac enzymes. Given his possible GERD, we will initiate heparin in the setting of positive troponin.  We will hold his Lasix and potassium and gently hydrate him in anticipation for his  heart catheterization which will be set up for tomorrow.  His other home medicines will be continued.  The plan was discussed with the patient who is eager to proceed.     Alexander Rice, P.A.C.   ______________________________ Alexander Rice, M.D.    DD/MEDQ  D:  04/25/2011  T:  04/26/2011  Job:  [id number]  cc:   Alexander Morton. [name], MD, [facility] [name], MD  Electronically Signed by Ronie Spies  on 05/03/2011 09:27:17 AM Electronically Signed by Roger Shelter M.D. on 06/18/2011 11:16:21 AM

## 2011-05-14 ENCOUNTER — Encounter: Payer: Self-pay | Admitting: Internal Medicine

## 2011-05-14 ENCOUNTER — Ambulatory Visit (INDEPENDENT_AMBULATORY_CARE_PROVIDER_SITE_OTHER): Payer: Medicare Other | Admitting: Internal Medicine

## 2011-05-14 DIAGNOSIS — D518 Other vitamin B12 deficiency anemias: Secondary | ICD-10-CM

## 2011-05-14 DIAGNOSIS — E785 Hyperlipidemia, unspecified: Secondary | ICD-10-CM

## 2011-05-14 DIAGNOSIS — N259 Disorder resulting from impaired renal tubular function, unspecified: Secondary | ICD-10-CM

## 2011-05-14 DIAGNOSIS — I2510 Atherosclerotic heart disease of native coronary artery without angina pectoris: Secondary | ICD-10-CM

## 2011-05-14 DIAGNOSIS — I10 Essential (primary) hypertension: Secondary | ICD-10-CM

## 2011-05-14 DIAGNOSIS — D469 Myelodysplastic syndrome, unspecified: Secondary | ICD-10-CM

## 2011-05-14 MED ORDER — OMEPRAZOLE 20 MG PO CPDR: 20.0000 mg | DELAYED_RELEASE_CAPSULE | Freq: Every day | ORAL | Status: DC

## 2011-05-14 MED ORDER — LISINOPRIL-HYDROCHLOROTHIAZIDE 20-25 MG PO TABS: 1.0000 | ORAL_TABLET | Freq: Every day | ORAL | Status: DC

## 2011-05-14 NOTE — Assessment & Plan Note (Signed)
Regular b12 shots

## 2011-05-14 NOTE — Assessment & Plan Note (Signed)
Not as well controlled Start lisinopril/hct..he was on this previously. i think was d/cd because of renal function Will recheck bmet in one month

## 2011-05-14 NOTE — Assessment & Plan Note (Signed)
No recurrent sxs Reviewed stent hx and recent angiogram report

## 2011-05-14 NOTE — Assessment & Plan Note (Signed)
Has been stable Reviewed labs

## 2011-05-14 NOTE — Assessment & Plan Note (Signed)
Well controlled Continue same meds 

## 2011-05-20 NOTE — Progress Notes (Signed)
  Subjective:    Patient ID: Alexander Rice, male    DOB: August 03, 1932, 77 y.o.   MRN: [id number]  HPI  Patient comes in for evaluation of multiple medical problems. Patient has coronary artery disease. He denies any chest pain, shortness breath, PND, 72. Patient has hypertension and renal insufficiency. Patient's already medications without difficulty.  Patient has myelodysplastic syndrome and is seen by Dr. Myna Hidalgo every quarter. He does receive Aranesp injections frequently. Dr. Myna Hidalgo has not had to give him a shot in several months.  Past Medical History  Diagnosis Date  . CAD (coronary artery disease)   . GERD (gastroesophageal reflux disease)   . Hyperlipidemia   . Hypertension   . BPH (benign prostatic hyperplasia)   . CVD (cardiovascular disease)   . Colon polyps   . B12 deficiency    Past Surgical History  Procedure Date  . Carotid endarterectomy   . Ptca     stent  . Quadriceps repair     muscle attachment    reports that he has never smoked. He does not have any smokeless tobacco history on file. He reports that he does not drink alcohol or use illicit drugs. family history includes Alcohol abuse in his father and COPD in his mother. No Known Allergies   Review of Systems  patient denies chest pain, shortness of breath, orthopnea. Denies lower extremity edema, abdominal pain, change in appetite, change in bowel movements. Patient denies rashes, musculoskeletal complaints. No other specific complaints in a complete review of systems.      Objective:   Physical Exam  Well-developed well-nourished male in no acute distress. HEENT exam atraumatic, normocephalic, extraocular muscles are intact. Neck is supple. No jugular venous distention no thyromegaly. Chest clear to auscultation without increased work of breathing. Cardiac exam S1 and S2 are regular. Abdominal exam active bowel sounds, soft, nontender. Extremities no edema. Neurologic exam she is alert without any  motor sensory deficits. Gait is normal.        Assessment & Plan:

## 2011-05-23 ENCOUNTER — Other Ambulatory Visit: Payer: Self-pay | Admitting: Internal Medicine

## 2011-05-23 DIAGNOSIS — Z0000 Encounter for general adult medical examination without abnormal findings: Secondary | ICD-10-CM

## 2011-05-29 ENCOUNTER — Ambulatory Visit (INDEPENDENT_AMBULATORY_CARE_PROVIDER_SITE_OTHER): Payer: Medicare Other | Admitting: Internal Medicine

## 2011-05-29 DIAGNOSIS — D518 Other vitamin B12 deficiency anemias: Secondary | ICD-10-CM

## 2011-05-30 ENCOUNTER — Other Ambulatory Visit: Payer: Self-pay | Admitting: Hematology & Oncology

## 2011-05-30 ENCOUNTER — Encounter (HOSPITAL_BASED_OUTPATIENT_CLINIC_OR_DEPARTMENT_OTHER): Payer: Medicare Other | Admitting: Hematology & Oncology

## 2011-05-30 DIAGNOSIS — N289 Disorder of kidney and ureter, unspecified: Secondary | ICD-10-CM

## 2011-05-30 DIAGNOSIS — D510 Vitamin B12 deficiency anemia due to intrinsic factor deficiency: Secondary | ICD-10-CM

## 2011-05-30 DIAGNOSIS — D649 Anemia, unspecified: Secondary | ICD-10-CM

## 2011-05-30 LAB — CBC WITH DIFFERENTIAL (CANCER CENTER ONLY)
BASO#: 0 10*3/uL (ref 0.0–0.2)
EOS%: 3.1 % (ref 0.0–7.0)
HCT: 29.7 % — ABNORMAL LOW (ref 38.7–49.9)
HGB: 10.4 g/dL — ABNORMAL LOW (ref 13.0–17.1)
LYMPH#: 1.4 10*3/uL (ref 0.9–3.3)
MCH: 35.1 pg — ABNORMAL HIGH (ref 28.0–33.4)
MCHC: 35 g/dL (ref 32.0–35.9)
MCV: 100 fL — ABNORMAL HIGH (ref 82–98)
MONO%: 6.2 % (ref 0.0–13.0)
NEUT%: 57.4 % (ref 40.0–80.0)

## 2011-05-30 LAB — RETICULOCYTES (CHCC): RBC.: 3.07 MIL/uL — ABNORMAL LOW (ref 4.22–5.81)

## 2011-05-30 LAB — IRON AND TIBC
TIBC: 274 ug/dL (ref 215–435)
UIBC: 94 ug/dL

## 2011-05-31 ENCOUNTER — Telehealth: Payer: Self-pay | Admitting: *Deleted

## 2011-05-31 NOTE — Telephone Encounter (Signed)
Pt calling to check status of colonoscopy due date.  Pt states per Dr. Valentino Hue was suppose to check to see if pt qualified to have colonoscopy done every 4 years instead of every 5 years.  Pt requesting return call.

## 2011-05-31 NOTE — Telephone Encounter (Signed)
Pt is going to check with insurance company to see if they will pay for it.  If they will then Dr Ardell Isaacs office has no problem doing the colonoscopy early

## 2011-06-04 ENCOUNTER — Encounter: Payer: Self-pay | Admitting: Cardiology

## 2011-06-04 ENCOUNTER — Ambulatory Visit (INDEPENDENT_AMBULATORY_CARE_PROVIDER_SITE_OTHER): Payer: Medicare Other | Admitting: Cardiology

## 2011-06-04 VITALS — BP 123/64 | HR 69 | Resp 18 | Ht 70.0 in | Wt 176.1 lb

## 2011-06-04 DIAGNOSIS — I2510 Atherosclerotic heart disease of native coronary artery without angina pectoris: Secondary | ICD-10-CM

## 2011-06-04 DIAGNOSIS — I10 Essential (primary) hypertension: Secondary | ICD-10-CM

## 2011-06-04 DIAGNOSIS — D518 Other vitamin B12 deficiency anemias: Secondary | ICD-10-CM

## 2011-06-04 DIAGNOSIS — E785 Hyperlipidemia, unspecified: Secondary | ICD-10-CM

## 2011-06-04 LAB — BASIC METABOLIC PANEL
BUN: 41 mg/dL — ABNORMAL HIGH (ref 6–23)
Calcium: 8.8 mg/dL (ref 8.4–10.5)
Creatinine, Ser: 1.4 mg/dL (ref 0.4–1.5)
GFR: 51.01 mL/min — ABNORMAL LOW (ref >60.00)
Glucose, Bld: 84 mg/dL (ref 70–99)
Sodium: 137 mEq/L (ref 135–145)

## 2011-06-04 NOTE — Assessment & Plan Note (Signed)
Followed by Dr. Ennever 

## 2011-06-04 NOTE — Patient Instructions (Signed)
LAB work today [employer] We will call you with results

## 2011-06-04 NOTE — Assessment & Plan Note (Signed)
Last lipid at [facility].  Followed by Dr. Cato Mulligan.

## 2011-06-04 NOTE — Progress Notes (Signed)
HPI:  Recently hospitalized.  Had some atypical symptoms.  Had repeat cath with widely patent coronaries.  No symptoms since.  Did have anemia, but says he is being seen on a regular basis by Dr. Myna Hidalgo.   Sometimes gets a shot.  Otherwise stable.   Current Outpatient Prescriptions  Medication Sig Dispense Refill  . ALPHAGAN P 0.1 % SOLN 1 drop 2 (two) times daily.       [name] aspirin 81 MG tablet Take 81 mg by mouth daily.        [name] doxazosin (CARDURA) 8 MG tablet TAKE 1 TABLET AT BEDTIME  90 tablet  3  . furosemide (LASIX) 20 MG tablet Take 20 mg by mouth daily.       . isosorbide mononitrate (IMDUR) 30 MG 24 hr tablet Take 1 tablet by mouth Daily.      [name] lisinopril-hydrochlorothiazide (PRINZIDE,ZESTORETIC) 20-25 MG per tablet Take 1 tablet by mouth daily.        [name] LUMIGAN 0.01 % SOLN Place 1 drop into both eyes daily.      . Multiple Vitamin (MULTIVITAMIN) tablet Take 1 tablet by mouth daily.        . nitroGLYCERIN (NITROSTAT) 0.4 MG SL tablet Place 0.4 mg under the tongue every 5 (five) minutes as needed.        . NON FORMULARY Cyanocobalamin injection         . Omega-3 Fatty Acids (FISH OIL) 1000 MG CAPS Take 1 capsule by mouth daily.        [name] omeprazole (PRILOSEC) 20 MG capsule Take 1 capsule (20 mg total) by mouth daily.  90 capsule  3  . oxybutynin (DITROPAN) 5 MG tablet Take 5 mg by mouth daily.       . potassium chloride SA (K-DUR,KLOR-CON) 20 MEQ tablet Take 20 mEq by mouth daily.       . simvastatin (ZOCOR) 40 MG tablet Take 40 mg by mouth at bedtime.        Current Facility-Administered Medications  Medication Dose Route Frequency Provider Last Rate Last Dose  . cyanocobalamin ((VITAMIN B-12)) injection 1,000 mcg  1,000 mcg Intramuscular Q30 days Judie Petit, MD   1,000 mcg at 05/29/11 1220    No Known Allergies  Past Medical History  Diagnosis Date  . CAD (coronary artery disease)   . GERD (gastroesophageal reflux disease)   . Hyperlipidemia   . Hypertension   . BPH  (benign prostatic hyperplasia)   . CVD (cardiovascular disease)   . Colon polyps   . B12 deficiency     Past Surgical History  Procedure Date  . Carotid endarterectomy   . Ptca     stent  . Quadriceps repair     muscle attachment    Family History  Problem Relation Age of Onset  . COPD Mother   . Alcohol abuse Father     History   Social History  . Marital Status: Married    Spouse Name: N/A    Number of Children: N/A  . Years of Education: N/A   Occupational History  . Not on file.   Social History Main Topics  . Smoking status: Never Smoker   . Smokeless tobacco: Not on file  . Alcohol Use: No  . Drug Use: No  . Sexually Active: Not on file   Other Topics Concern  . Not on file   Social History Narrative  . No narrative on file    ROS: Please see  the HPI.  All other systems reviewed and negative.  PHYSICAL EXAM:  BP 123/64  Pulse 69  Resp 18  Ht 5\' 10"  (1.778 m)  Wt 176 lb 1.9 oz (79.888 kg)  BMI 25.27 kg/m2  General: Well developed, well nourished, in no acute distress. Head:  Normocephalic and atraumatic. Neck: no JVD Lungs: Clear to auscultation and percussion. Heart: Normal S1 and S2.  No murmur, rubs or gallops.  Abdomen:  Normal bowel sounds; soft; non tender; no organomegaly Pulses: Pulses normal in all 4 extremities. Extremities: No clubbing or cyanosis. No edema. Neurologic: Alert and oriented x 3.  EKG:  SB.  Otherwise normal tracing.    ASSESSMENT AND PLAN:

## 2011-06-04 NOTE — Assessment & Plan Note (Signed)
Treated by Dr. Cato Mulligan.  Does need follow up BMET as on two diuretics.

## 2011-06-04 NOTE — Assessment & Plan Note (Signed)
See above findings.  No recurrence.  Will see back prn.

## 2011-06-07 NOTE — Cardiovascular Report (Signed)
NAME:  Alexander Rice, Alexander Rice NO.:  [id number]  MEDICAL RECORD NO.:  [id number]  LOCATION:  6525                         FACILITY:  [facility]  PHYSICIAN:  Veverly Fells. Excell Seltzer, MD  DATE OF BIRTH:  Jan 25, 1932  DATE OF PROCEDURE:  04/26/2011 DATE OF DISCHARGE:  04/26/2011                           CARDIAC CATHETERIZATION   PROCEDURES: 1. Left heart catheterization. 2. Selective coronary angiography. 3. Left ventricular angiography.  PROCEDURAL INDICATIONS:  Mr. Mally is a 77 year old gentleman with known CAD.  He underwent remote stenting of the right coronary artery in 1999.  His last catheterization was in 2006 by Dr. [name] and he was found to have moderate diffuse disease with no high-grade stenosis.  He was referred eight.  He has had recurrent symptoms similar to those that his initial presentation and he was referred for cardiac cath.  Risks and indications of the procedure were reviewed with the patient and informed consent was obtained.  The right wrist was prepped, draped and anesthetized with 1% lidocaine.  Using the modified Seldinger technique, a 5-French sheath was placed in the right radial artery.  I had difficulty advancing the 0.018-inch wire beyond the midforearm.  I was able to place a sheath into the right radial.  I tried to advance a safety J-wire, but it would not cross the elbow.  I performed radial artery angiography and this demonstrated significant tortuosity of the radial artery with a diffuse small caliber vessel.  This was done after verapamil had been administered through the sheath.  There was also a question of loop as the radial approach to the elbow.  I tried to advance a Wholey wire, but it would not cross the midforearm and at that point, it was clear that catheters were going to advance past the radial artery.  The femoral artery was accessed without difficulty and a 5- French sheath was placed.  The patient was then given 3000  units of heparin and a TR band was placed for radial hemostasis.  At that point, selective coronary angiography was performed with standard Judkins catheters.  A ventriculogram was performed with pigtail catheter.  All catheter exchanges were performed over a guidewire and the patient tolerated the procedure well.  He was transferred to the [facility] recovery area in stable condition.  PROCEDURAL FINDINGS:  Aortic pressure 161/59 with a mean of 96, left ventricular pressure 160/16.  Left ventriculography shows normal LV function.  The ejection fraction is 55%.  There are no segmental wall motion abnormalities.  CORONARY ANGIOGRAPHY:  The left mainstem is widely patent.  It divides into the LAD and left circumflex.  There is mild left main calcification.  LAD:  The LAD is a small-to-moderate caliber vessel.  It is diffusely calcified and it reaches the LV apex.  There is a high diagonal that is actually larger in caliber than the LAD.  The LAD gives off several small septal perforators.  There is diffuse nonobstructive disease throughout the course of the LAD with segmental 30-40% proximal LAD stenosis.  The diagonal also has segmental stenosis of 40-50%.  Left circumflex:  The left circumflex is also calcified.  The circumflex has mild ostial stenosis of 20-30%.  The midcircumflex  has 50% stenosis at the origin of the small first OM branch.  The second OM is larger in caliber than the first OM and it is widely patent.  Right coronary artery:  The right coronary artery has a heavily calcified ostium with calcification extending into the right coronary cusp.  Now, the proximal RCA just beyond the conus branch has diffuse 50% stenosis.  There is a stent in the distal RCA with wide patency and maybe 20-30% in-stent restenosis.  The vessel gives off a PDA and posterolateral branch, both are patent.  The posterolateral branch has 30-40% stenosis in the posterior AV segment  portion.  FINAL ASSESSMENT: 1. Diffuse nonobstructive coronary artery disease as described above. 2. Normal left ventricular systolic function with normal left     ventricular diastolic filling pressure.  DISCUSSION:  The patient's films were compared to his catheterization from 2006.  There is no significant progression of obstructive coronary artery disease.  He should do well with ongoing medical therapy.     Veverly Fells. Excell Seltzer, MD     MDC/MEDQ  D:  04/26/2011  T:  04/27/2011  Job:  [id number]  cc:   Arturo Morton. [name], MD, [facility] [name], MD  Electronically Signed by Tonny Bollman MD on 06/07/2011 12:12:55 AM

## 2011-06-07 NOTE — Discharge Summary (Signed)
NAME:  Alexander Rice, Alexander Rice NO.:  [id number]  MEDICAL RECORD NO.:  [id number]  LOCATION:  6525                         FACILITY:  [facility]  PHYSICIAN:  Veverly Fells. Excell Seltzer, MD  DATE OF BIRTH:  1932/03/22  DATE OF ADMISSION:  04/25/2011 DATE OF DISCHARGE:  04/26/2011                              DISCHARGE SUMMARY   PRIMARY CARDIOLOGIST:  Maisie Fus D. [name], MD, [facility]  PRIMARY CARE PROVIDER:  Valetta Mole Swords, MD  DISCHARGE DIAGNOSIS:  Chest pain without objective evidence of ischemia.  SECONDARY DIAGNOSES: 1. Coronary artery disease, status post prior RCA stenting in 1999. 2. Hypertension. 3. History of bradycardia. 4. History of carotid arterial disease, status post right carotid     endarterectomy. 5. Hyperlipidemia. 6. Stage III chronic kidney disease. 7. B12 deficiency anemia. 8. History of back surgery.  ALLERGIES:  No known drug allergies.  PROCEDURES:  Left heart cardiac catheterization performed on April 26, 2011, showing nonobstructive CAD and normal LV function.  HISTORY OF PRESENT ILLNESS:  A 77 year old male with prior history coronary artery disease, status post right coronary artery intervention in 1999 with nonobstructive cath in 2006, was in his usual state of health until June 6 when the patient had mild chest discomfort similar to prior angina lasting approximately 1 hour and resolving spontaneously in the ED.  In the ED, the patient had T-wave inversion in lead III which was new since 2009, but otherwise no acute changes.  Enzymes were negative and he was admitted for evaluation.  HOSPITAL COURSE:  The patient ruled out for MI.  He had no further chest discomfort.  Because the discomfort was similar to prior angina, a decision was made to pursue catheterization, which took place this morning revealing nonobstructive CAD and normal LV function.  Post catheterization, he was ambulating without difficulty and will be discharged home today in good  condition.  DISCHARGE LABS:  Hemoglobin 10.5, hematocrit 31.0, WBC 3.7, platelets 288.  INR 0.95.  Sodium 142, potassium 3.6, chloride 108, CO2 of 26, BUN 21, creatinine 1.16, glucose 94, calcium 8.1.  CK 90, MD 3.0, troponin-I less than 0.30.  Total cholesterol 133, triglycerides 95, HDL 77, LDL 37.  TSH 2.676.  DISPOSITION:  The patient will be discharged home today in good condition.  FOLLOWUP APPOINTMENTS:  The patient will follow up with Dr. [name] in approximately 1 month.  He will also follow with Dr. Cato Mulligan in the next 2-3 weeks.  DISCHARGE MEDICATIONS: 1. Alphagan 0.1% one drop both eyes t.i.d. 2. Aspirin 81 mg 2 tablets daily. 3. Vitamin B12 injection subcu monthly. 4. Doxazosin 8 mg daily. 5. Fish oil 1 capsule daily. 6. Furosemide 20 mg daily. 7. Imdur 30 mg q.h.s. 8. Lumigan 1 drop both eyes nightly. 9. Multivitamin 1 tablet daily. 10.Nitroglycerin 0.4 mg sublingual for chest pain. 11.Omeprazole 20 mg b.i.d. 12.Oxybutynin 5 mg daily. 13.Potassium chloride 20 mEq daily. 14.Simvastatin 40 mg q.h.s.  OUTSTANDING LAB STUDIES:  None.  Duration of discharge encounter 35 minutes including physician time.     Nicolasa Ducking, ANP   ______________________________ Veverly Fells. Excell Seltzer, MD    CB/MEDQ  D:  04/26/2011  T:  04/27/2011  Job:  [id number]  cc:  Bruce Rexene Edison Swords, MD  Electronically Signed by Nicolasa Ducking ANP on 05/02/2011 04:24:17 PM Electronically Signed by Tonny Bollman MD on 06/07/2011 12:12:59 AM

## 2011-06-14 ENCOUNTER — Ambulatory Visit (INDEPENDENT_AMBULATORY_CARE_PROVIDER_SITE_OTHER): Payer: Medicare Other | Admitting: *Deleted

## 2011-06-14 DIAGNOSIS — E785 Hyperlipidemia, unspecified: Secondary | ICD-10-CM

## 2011-06-14 DIAGNOSIS — I2510 Atherosclerotic heart disease of native coronary artery without angina pectoris: Secondary | ICD-10-CM

## 2011-06-14 DIAGNOSIS — I10 Essential (primary) hypertension: Secondary | ICD-10-CM

## 2011-06-14 LAB — CBC WITH DIFFERENTIAL/PLATELET
Basophils Relative: 0.3 % (ref 0.0–3.0)
Eosinophils Relative: 3.5 % (ref 0.0–5.0)
Lymphocytes Relative: 33.8 % (ref 12.0–46.0)
Neutrophils Relative %: 55.3 % (ref 43.0–77.0)
RBC: 2.67 Mil/uL — ABNORMAL LOW (ref 4.22–5.81)
WBC: 4 10*3/uL — ABNORMAL LOW (ref 4.5–10.5)

## 2011-06-14 LAB — BASIC METABOLIC PANEL
Calcium: 9.1 mg/dL (ref 8.4–10.5)
Creatinine, Ser: 1.6 mg/dL — ABNORMAL HIGH (ref 0.4–1.5)
GFR: 43.54 mL/min — ABNORMAL LOW (ref >60.00)

## 2011-06-19 ENCOUNTER — Ambulatory Visit: Payer: Medicare Other | Admitting: Internal Medicine

## 2011-06-19 ENCOUNTER — Telehealth: Payer: Self-pay | Admitting: *Deleted

## 2011-06-19 NOTE — Telephone Encounter (Signed)
Pt called and said his insurance will not pay for a colonoscopy until May 2013.  I am thinking that pt was confused and wants a Endoscopy.  He started talking about how bad his acid reflux is and really wanted a "colonoscopy" to look at his stomach.  Pt would like a referral for an Endoscopy

## 2011-07-02 ENCOUNTER — Ambulatory Visit (INDEPENDENT_AMBULATORY_CARE_PROVIDER_SITE_OTHER): Payer: Medicare Other | Admitting: Internal Medicine

## 2011-07-02 DIAGNOSIS — D469 Myelodysplastic syndrome, unspecified: Secondary | ICD-10-CM

## 2011-07-02 DIAGNOSIS — N259 Disorder resulting from impaired renal tubular function, unspecified: Secondary | ICD-10-CM

## 2011-07-02 DIAGNOSIS — D518 Other vitamin B12 deficiency anemias: Secondary | ICD-10-CM

## 2011-07-02 DIAGNOSIS — N400 Enlarged prostate without lower urinary tract symptoms: Secondary | ICD-10-CM

## 2011-07-02 LAB — BASIC METABOLIC PANEL
BUN: 41 mg/dL — ABNORMAL HIGH (ref 6–23)
Chloride: 108 mEq/L (ref 96–112)
Glucose, Bld: 94 mg/dL (ref 70–99)
Potassium: 5.2 mEq/L — ABNORMAL HIGH (ref 3.5–5.1)
Sodium: 139 mEq/L (ref 135–145)

## 2011-07-02 NOTE — Assessment & Plan Note (Signed)
Needs further eval Refer urology

## 2011-07-02 NOTE — Assessment & Plan Note (Signed)
Will check labs today Continue same meds

## 2011-07-02 NOTE — Assessment & Plan Note (Signed)
Has regular f/u with hematology

## 2011-07-02 NOTE — Assessment & Plan Note (Signed)
Check labs today.

## 2011-07-02 NOTE — Progress Notes (Signed)
  Subjective:    Patient ID: Alexander Rice, male    DOB: 09/06/1932, 77 y.o.   MRN: [id number]  HPI  Patient Active Problem List  Diagnoses  . HYPERLIPIDEMIA--tolerating meds without difficulty  . ANEMIA, B12 DEFICIENCY---monthly b12 injections  . HYPERTENSION---tolerating meds  . CORONARY ARTERY DISEASE---no sxs  . GERD  . RENAL INSUFFICIENCY---needs regular f/u  .   [name]   [name]    Past Medical History  Diagnosis Date  . CAD (coronary artery disease)   . GERD (gastroesophageal reflux disease)   . Hyperlipidemia   . Hypertension   . BPH (benign prostatic hyperplasia)   . CVD (cardiovascular disease)   . Colon polyps   . B12 deficiency    Past Surgical History  Procedure Date  . Carotid endarterectomy   . Ptca     stent  . Quadriceps repair     muscle attachment    reports that he has never smoked. He does not have any smokeless tobacco history on file. He reports that he does not drink alcohol or use illicit drugs. family history includes Alcohol abuse in his father and COPD in his mother. No Known Allergies   Review of Systems  patient denies chest pain, shortness of breath, orthopnea. Denies lower extremity edema, abdominal pain, change in appetite, change in bowel movements. Patient denies rashes, musculoskeletal complaints. No other specific complaints in a complete review of systems.      Objective:   Physical Exam  well-developed well-nourished male in no acute distress. HEENT exam atraumatic, normocephalic, neck supple without jugular venous distention. Chest clear to auscultation cardiac exam S1-S2 are regular. Abdominal exam overweight with bowel sounds, soft and nontender. Extremities no edema. Neurologic exam is alert with a normal gait.     Assessment & Plan:

## 2011-07-03 ENCOUNTER — Telehealth: Payer: Self-pay | Admitting: Cardiology

## 2011-07-03 DIAGNOSIS — I2510 Atherosclerotic heart disease of native coronary artery without angina pectoris: Secondary | ICD-10-CM

## 2011-07-03 NOTE — Telephone Encounter (Signed)
lmtcb I need to see if Alexander Rice is still holding his Lasix.

## 2011-07-03 NOTE — Telephone Encounter (Signed)
Pt calling back to find out results of pt blood work. Please return pt call.

## 2011-07-03 NOTE — Telephone Encounter (Addendum)
Spoke with Dr. [name], patient is mildly anemic. He will see Dr. Twanna Hy next week. Will continue to hold Lasix since his kidney function is still mildly elevated.

## 2011-07-03 NOTE — Telephone Encounter (Signed)
Test results. Pt returning call back to Dr. [name].

## 2011-07-03 NOTE — Telephone Encounter (Signed)
Patient is still holding his Lasix. His kidney function is still mildly elevated. Will discuss with Dr. [name].

## 2011-07-03 NOTE — Telephone Encounter (Signed)
lmtcb Dr. Cato Mulligan ordered the most recent blood work. Will check with Dr. [name] regarding Mr. Lokken's elevated BUN since holding Lasix.

## 2011-07-04 ENCOUNTER — Ambulatory Visit: Payer: Medicare Other | Admitting: Internal Medicine

## 2011-07-05 NOTE — Telephone Encounter (Signed)
Pt rtn call to dr stuckey's nurse-pl call [phone]

## 2011-07-05 NOTE — Telephone Encounter (Signed)
Returning call back nurse. Pt states he going to golf course.

## 2011-07-05 NOTE — Telephone Encounter (Signed)
Pt rtn your call

## 2011-07-05 NOTE — Telephone Encounter (Signed)
Per Dr. [name], [name] will need to continue to hold his Lasix due to his elevated kidney function and recheck a BMP in 1 month.

## 2011-07-05 NOTE — Telephone Encounter (Signed)
lmtcb

## 2011-07-06 NOTE — Telephone Encounter (Signed)
Pt returned your call and I read your message to him.  He will call back on Monday to schedule this lab test.

## 2011-07-09 ENCOUNTER — Encounter: Payer: Self-pay | Admitting: Cardiology

## 2011-07-09 NOTE — Telephone Encounter (Signed)
I have attempted calling patient multiple times. I will mail Mr. Glasscock a letter to call & schedule this.

## 2011-07-11 ENCOUNTER — Other Ambulatory Visit: Payer: Self-pay | Admitting: Hematology & Oncology

## 2011-07-11 ENCOUNTER — Encounter (HOSPITAL_BASED_OUTPATIENT_CLINIC_OR_DEPARTMENT_OTHER): Payer: Medicare Other | Admitting: Hematology & Oncology

## 2011-07-11 DIAGNOSIS — D510 Vitamin B12 deficiency anemia due to intrinsic factor deficiency: Secondary | ICD-10-CM

## 2011-07-11 DIAGNOSIS — N289 Disorder of kidney and ureter, unspecified: Secondary | ICD-10-CM

## 2011-07-11 DIAGNOSIS — D649 Anemia, unspecified: Secondary | ICD-10-CM

## 2011-07-11 LAB — CBC WITH DIFFERENTIAL (CANCER CENTER ONLY)
BASO#: 0 10*3/uL (ref 0.0–0.2)
EOS%: 3.7 % (ref 0.0–7.0)
HCT: 22.1 % — ABNORMAL LOW (ref 38.7–49.9)
HGB: 7.7 g/dL — ABNORMAL LOW (ref 13.0–17.1)
MCH: 34.7 pg — ABNORMAL HIGH (ref 28.0–33.4)
MCHC: 34.8 g/dL (ref 32.0–35.9)
MONO%: 9.3 % (ref 0.0–13.0)
NEUT%: 60.6 % (ref 40.0–80.0)

## 2011-07-11 LAB — RETICULOCYTES (CHCC)
ABS Retic: 34.7 10*3/uL (ref 19.0–186.0)
RBC.: 2.31 MIL/uL — ABNORMAL LOW (ref 4.22–5.81)

## 2011-07-11 LAB — FERRITIN: Ferritin: 752 ng/mL — ABNORMAL HIGH (ref 22–322)

## 2011-07-18 ENCOUNTER — Telehealth: Payer: Self-pay | Admitting: Internal Medicine

## 2011-07-18 MED ORDER — ISOSORBIDE MONONITRATE ER 30 MG PO TB24: 30.0000 mg | ORAL_TABLET | Freq: Every day | ORAL | Status: DC

## 2011-07-18 NOTE — Telephone Encounter (Signed)
Pt would like a refill on his Isosorbide Mono (Imdur) tabs. Please send in to [facility]. Please call pt to let him know when it is done.

## 2011-07-18 NOTE — Telephone Encounter (Signed)
rx sent in electronically, pt aware 

## 2011-07-31 ENCOUNTER — Ambulatory Visit: Payer: Medicare Other | Admitting: *Deleted

## 2011-08-01 ENCOUNTER — Ambulatory Visit: Payer: Medicare Other | Admitting: *Deleted

## 2011-08-02 ENCOUNTER — Encounter (HOSPITAL_BASED_OUTPATIENT_CLINIC_OR_DEPARTMENT_OTHER): Payer: Medicare Other | Admitting: Hematology & Oncology

## 2011-08-02 ENCOUNTER — Other Ambulatory Visit: Payer: Self-pay | Admitting: Hematology & Oncology

## 2011-08-02 DIAGNOSIS — D649 Anemia, unspecified: Secondary | ICD-10-CM

## 2011-08-02 DIAGNOSIS — N289 Disorder of kidney and ureter, unspecified: Secondary | ICD-10-CM

## 2011-08-02 DIAGNOSIS — D510 Vitamin B12 deficiency anemia due to intrinsic factor deficiency: Secondary | ICD-10-CM

## 2011-08-02 DIAGNOSIS — D638 Anemia in other chronic diseases classified elsewhere: Secondary | ICD-10-CM

## 2011-08-02 LAB — CBC WITH DIFFERENTIAL (CANCER CENTER ONLY)
BASO%: 0.6 % (ref 0.0–2.0)
EOS%: 4.3 % (ref 0.0–7.0)
HCT: 25.1 % — ABNORMAL LOW (ref 38.7–49.9)
HGB: 8.5 g/dL — ABNORMAL LOW (ref 13.0–17.1)
LYMPH#: 0.9 10*3/uL (ref 0.9–3.3)
MONO#: 0.3 10*3/uL (ref 0.1–0.9)
NEUT#: 1.9 10*3/uL (ref 1.5–6.5)
NEUT%: 58.2 % (ref 40.0–80.0)
RDW: 14.8 % (ref 11.1–15.7)
WBC: 3.2 10*3/uL — ABNORMAL LOW (ref 4.0–10.0)

## 2011-08-07 ENCOUNTER — Other Ambulatory Visit (INDEPENDENT_AMBULATORY_CARE_PROVIDER_SITE_OTHER): Payer: Medicare Other | Admitting: *Deleted

## 2011-08-07 DIAGNOSIS — I2510 Atherosclerotic heart disease of native coronary artery without angina pectoris: Secondary | ICD-10-CM

## 2011-08-08 LAB — BASIC METABOLIC PANEL
CO2: 20 mEq/L (ref 19–32)
Chloride: 118 mEq/L — ABNORMAL HIGH (ref 96–112)
Creatinine, Ser: 1.9 mg/dL — ABNORMAL HIGH (ref 0.4–1.5)
Potassium: 4.7 mEq/L (ref 3.5–5.1)

## 2011-08-14 ENCOUNTER — Ambulatory Visit (INDEPENDENT_AMBULATORY_CARE_PROVIDER_SITE_OTHER): Payer: Medicare Other | Admitting: Internal Medicine

## 2011-08-14 ENCOUNTER — Encounter: Payer: Self-pay | Admitting: Internal Medicine

## 2011-08-14 DIAGNOSIS — I2510 Atherosclerotic heart disease of native coronary artery without angina pectoris: Secondary | ICD-10-CM

## 2011-08-14 DIAGNOSIS — N259 Disorder resulting from impaired renal tubular function, unspecified: Secondary | ICD-10-CM

## 2011-08-14 NOTE — Progress Notes (Signed)
  Subjective:    Patient ID: Alexander Rice, male    DOB: 1932-04-15, 77 y.o.   MRN: [id number]  HPI Asked to see by dr [name] Review creatinine---reviewed previous labs---dating back to 2009 creatinine has been as high as 2.0mg /dl. He is on ACE-i , hctz and furosemide. Pt has no complaints  Reviewed recent CBC---note anemia followed by dr Drue Dun  Patient has a history of coronary artery disease. He has not had any recent symptoms of chest pain, shortness of breath. I have reviewed coronary catheterization study done in June of 2012.  Past Medical History  Diagnosis Date  . CAD (coronary artery disease)   . GERD (gastroesophageal reflux disease)   . Hyperlipidemia   . Hypertension   . BPH (benign prostatic hyperplasia)   . CVD (cardiovascular disease)   . Colon polyps   . B12 deficiency    Past Surgical History  Procedure Date  . Carotid endarterectomy   . Ptca     stent  . Quadriceps repair     muscle attachment    reports that he has never smoked. He does not have any smokeless tobacco history on file. He reports that he does not drink alcohol or use illicit drugs. family history includes Alcohol abuse in his father and COPD in his mother. No Known Allergies   Review of Systems  patient denies chest pain, shortness of breath, orthopnea. Denies lower extremity edema, abdominal pain, change in appetite, change in bowel movements. Patient denies rashes, musculoskeletal complaints. No other specific complaints in a complete review of systems.      Objective:   Physical Exam  well-developed well-nourished male in no acute distress. HEENT exam atraumatic, normocephalic, neck supple without jugular venous distention. Chest clear to auscultation cardiac exam S1-S2 are regular. Abdominal exam overweight with bowel sounds, soft and nontender. Extremities no edema. Neurologic exam is alert with a normal gait.        Assessment & Plan:

## 2011-08-14 NOTE — Assessment & Plan Note (Signed)
No sx's 

## 2011-08-14 NOTE — Assessment & Plan Note (Signed)
Long hx of renal insufficiency Note two diuretics and ace-i Off lisinopril/hct for 2 days bp still adequately controlled  BP Readings from Last 3 Encounters:  08/14/11 120/60  07/02/11 126/60  06/04/11 123/64   Will check labs and blood pressure next week (nurse visit)  Lab Results  Component Value Date   CREATININE 1.9* 08/07/2011

## 2011-08-15 LAB — URINALYSIS, ROUTINE W REFLEX MICROSCOPIC
Bilirubin Urine: NEGATIVE
Hgb urine dipstick: NEGATIVE
Ketones, ur: 15 — AB
Nitrite: NEGATIVE
Specific Gravity, Urine: 1.014
Urobilinogen, UA: 1
pH: 7

## 2011-08-15 LAB — POCT CARDIAC MARKERS
CKMB, poc: 1.5
CKMB, poc: 1.8
Myoglobin, poc: 128
Troponin i, poc: <0.05

## 2011-08-15 LAB — DIFFERENTIAL
Eosinophils Absolute: 0.1
Eosinophils Relative: 2
Lymphs Abs: 1.2
Monocytes Absolute: 0.4
Monocytes Relative: 8
Neutrophils Relative %: 66

## 2011-08-15 LAB — COMPREHENSIVE METABOLIC PANEL
ALT: 22
AST: 20
Albumin: 3.6
Alkaline Phosphatase: 55
Calcium: 8.6
GFR calc Af Amer: 56 — ABNORMAL LOW
Potassium: 4.3
Sodium: 140
Total Protein: 5.8 — ABNORMAL LOW

## 2011-08-15 LAB — CBC
MCHC: 33.8
Platelets: 285
RDW: 14.1

## 2011-08-15 LAB — B-NATRIURETIC PEPTIDE (CONVERTED LAB): Pro B Natriuretic peptide (BNP): 79

## 2011-08-21 ENCOUNTER — Other Ambulatory Visit (INDEPENDENT_AMBULATORY_CARE_PROVIDER_SITE_OTHER): Payer: Medicare Other

## 2011-08-21 DIAGNOSIS — D649 Anemia, unspecified: Secondary | ICD-10-CM

## 2011-08-21 LAB — VITAMIN B12: Vitamin B-12: 837 pg/mL (ref 211–911)

## 2011-08-21 LAB — BASIC METABOLIC PANEL
Chloride: 109 mEq/L (ref 96–112)
GFR: 60.66 mL/min (ref >60.00)
Potassium: 4.5 mEq/L (ref 3.5–5.1)
Sodium: 141 mEq/L (ref 135–145)

## 2011-08-22 ENCOUNTER — Other Ambulatory Visit: Payer: Self-pay | Admitting: Hematology & Oncology

## 2011-08-22 DIAGNOSIS — D638 Anemia in other chronic diseases classified elsewhere: Secondary | ICD-10-CM

## 2011-08-22 LAB — CBC WITH DIFFERENTIAL (CANCER CENTER ONLY)
BASO#: 0 10*3/uL (ref 0.0–0.2)
EOS%: 5.4 % (ref 0.0–7.0)
Eosinophils Absolute: 0.2 10*3/uL (ref 0.0–0.5)
HCT: 30.3 % — ABNORMAL LOW (ref 38.7–49.9)
HGB: 10.1 g/dL — ABNORMAL LOW (ref 13.0–17.1)
LYMPH#: 1 10*3/uL (ref 0.9–3.3)
MONO#: 0.3 10*3/uL (ref 0.1–0.9)
NEUT#: 2.7 10*3/uL (ref 1.5–6.5)
NEUT%: 63.1 % (ref 40.0–80.0)
RBC: 2.86 10*6/uL — ABNORMAL LOW (ref 4.20–5.70)

## 2011-08-22 LAB — RETICULOCYTES (CHCC)
ABS Retic: 32.7 10*3/uL (ref 19.0–186.0)
RBC.: 2.97 MIL/uL — ABNORMAL LOW (ref 4.22–5.81)
Retic Ct Pct: 1.1 % (ref 0.4–2.3)

## 2011-08-22 LAB — FERRITIN: Ferritin: 607 ng/mL — ABNORMAL HIGH (ref 22–322)

## 2011-08-22 LAB — IRON AND TIBC: Iron: 149 ug/dL (ref 42–165)

## 2011-08-27 ENCOUNTER — Ambulatory Visit: Payer: Medicare Other | Admitting: Internal Medicine

## 2011-08-27 VITALS — BP 118/64

## 2011-08-27 DIAGNOSIS — I10 Essential (primary) hypertension: Secondary | ICD-10-CM

## 2011-08-27 NOTE — Progress Notes (Signed)
Verbal orders from Dr Cato Mulligan, pt is to stay off the Lisinopril HCTZ 20/25 and check a BMET in 2 wks.  PT aware

## 2011-09-05 LAB — CBC
Hemoglobin: 12 — ABNORMAL LOW
MCHC: 34.2
RBC: 3.74 — ABNORMAL LOW

## 2011-09-05 LAB — URINALYSIS, ROUTINE W REFLEX MICROSCOPIC
Glucose, UA: NEGATIVE
Hgb urine dipstick: NEGATIVE
Ketones, ur: NEGATIVE
pH: 6.5

## 2011-09-05 LAB — BASIC METABOLIC PANEL
CO2: 25
Calcium: 9.2
GFR calc Af Amer: 57 — ABNORMAL LOW
GFR calc non Af Amer: 47 — ABNORMAL LOW
Potassium: 4.9
Sodium: 143

## 2011-09-05 LAB — PROTIME-INR: INR: 1

## 2011-09-05 LAB — APTT: aPTT: 29

## 2011-09-10 ENCOUNTER — Other Ambulatory Visit (INDEPENDENT_AMBULATORY_CARE_PROVIDER_SITE_OTHER): Payer: Medicare Other

## 2011-09-10 DIAGNOSIS — I10 Essential (primary) hypertension: Secondary | ICD-10-CM

## 2011-09-10 LAB — BASIC METABOLIC PANEL
Chloride: 110 mEq/L (ref 96–112)
Potassium: 4.4 mEq/L (ref 3.5–5.1)

## 2011-09-11 ENCOUNTER — Telehealth: Payer: Self-pay | Admitting: Internal Medicine

## 2011-09-11 MED ORDER — SIMVASTATIN 40 MG PO TABS: 40.0000 mg | ORAL_TABLET | Freq: Every day | ORAL | Status: DC

## 2011-09-11 NOTE — Telephone Encounter (Signed)
Pt requesting refill on simvastatin (ZOCOR) 40 MG tablet   [facility]

## 2011-09-11 NOTE — Telephone Encounter (Signed)
rx sent in electronically 

## 2011-09-19 ENCOUNTER — Other Ambulatory Visit: Payer: Self-pay | Admitting: Hematology & Oncology

## 2011-09-19 ENCOUNTER — Encounter (HOSPITAL_BASED_OUTPATIENT_CLINIC_OR_DEPARTMENT_OTHER): Payer: Medicare Other | Admitting: Hematology & Oncology

## 2011-09-19 DIAGNOSIS — D510 Vitamin B12 deficiency anemia due to intrinsic factor deficiency: Secondary | ICD-10-CM

## 2011-09-19 DIAGNOSIS — N289 Disorder of kidney and ureter, unspecified: Secondary | ICD-10-CM

## 2011-09-19 DIAGNOSIS — D649 Anemia, unspecified: Secondary | ICD-10-CM

## 2011-09-19 LAB — CBC WITH DIFFERENTIAL (CANCER CENTER ONLY)
Eosinophils Absolute: 0.1 10*3/uL (ref 0.0–0.5)
HCT: 32.3 % — ABNORMAL LOW (ref 38.7–49.9)
LYMPH%: 28.6 % (ref 14.0–48.0)
MCV: 104 fL — ABNORMAL HIGH (ref 82–98)
MONO#: 0.3 10*3/uL (ref 0.1–0.9)
NEUT%: 60.3 % (ref 40.0–80.0)
RBC: 3.1 10*6/uL — ABNORMAL LOW (ref 4.20–5.70)
WBC: 4.1 10*3/uL (ref 4.0–10.0)

## 2011-09-19 LAB — IRON AND TIBC
Iron: 111 ug/dL (ref 42–165)
TIBC: 254 ug/dL (ref 215–435)
UIBC: 143 ug/dL (ref 125–400)

## 2011-09-19 LAB — FERRITIN: Ferritin: 643 ng/mL — ABNORMAL HIGH (ref 22–322)

## 2011-09-20 ENCOUNTER — Ambulatory Visit: Payer: Medicare Other | Admitting: Internal Medicine

## 2011-10-31 ENCOUNTER — Ambulatory Visit (INDEPENDENT_AMBULATORY_CARE_PROVIDER_SITE_OTHER): Payer: Medicare Other | Admitting: Internal Medicine

## 2011-10-31 DIAGNOSIS — D518 Other vitamin B12 deficiency anemias: Secondary | ICD-10-CM

## 2011-11-01 ENCOUNTER — Ambulatory Visit: Payer: Medicare Other | Admitting: Internal Medicine

## 2011-11-02 ENCOUNTER — Encounter: Payer: Self-pay | Admitting: Hematology & Oncology

## 2011-11-02 ENCOUNTER — Other Ambulatory Visit (HOSPITAL_BASED_OUTPATIENT_CLINIC_OR_DEPARTMENT_OTHER): Payer: Medicare Other | Admitting: Lab

## 2011-11-02 ENCOUNTER — Ambulatory Visit (HOSPITAL_BASED_OUTPATIENT_CLINIC_OR_DEPARTMENT_OTHER): Payer: Medicare Other | Admitting: Hematology & Oncology

## 2011-11-02 ENCOUNTER — Other Ambulatory Visit: Payer: Self-pay | Admitting: Hematology & Oncology

## 2011-11-02 DIAGNOSIS — D649 Anemia, unspecified: Secondary | ICD-10-CM

## 2011-11-02 DIAGNOSIS — N289 Disorder of kidney and ureter, unspecified: Secondary | ICD-10-CM

## 2011-11-02 DIAGNOSIS — D510 Vitamin B12 deficiency anemia due to intrinsic factor deficiency: Secondary | ICD-10-CM

## 2011-11-02 DIAGNOSIS — D631 Anemia in chronic kidney disease: Secondary | ICD-10-CM

## 2011-11-02 DIAGNOSIS — N189 Chronic kidney disease, unspecified: Secondary | ICD-10-CM

## 2011-11-02 DIAGNOSIS — Q068 Other specified congenital malformations of spinal cord: Secondary | ICD-10-CM

## 2011-11-02 DIAGNOSIS — D469 Myelodysplastic syndrome, unspecified: Secondary | ICD-10-CM

## 2011-11-02 HISTORY — DX: Anemia in chronic kidney disease: D63.1

## 2011-11-02 HISTORY — DX: Chronic kidney disease, unspecified: N18.9

## 2011-11-02 LAB — CHCC SATELLITE - SMEAR

## 2011-11-02 LAB — IRON AND TIBC: UIBC: 70 ug/dL — ABNORMAL LOW (ref 125–400)

## 2011-11-02 LAB — CBC WITH DIFFERENTIAL (CANCER CENTER ONLY)
BASO%: 0.3 % (ref 0.0–2.0)
EOS%: 4.1 % (ref 0.0–7.0)
Eosinophils Absolute: 0.2 10*3/uL (ref 0.0–0.5)
LYMPH%: 28.6 % (ref 14.0–48.0)
MCH: 34.5 pg — ABNORMAL HIGH (ref 28.0–33.4)
MCV: 104 fL — ABNORMAL HIGH (ref 82–98)
MONO%: 8.7 % (ref 0.0–13.0)
NEUT#: 2.3 10*3/uL (ref 1.5–6.5)
Platelets: 296 10*3/uL (ref 145–400)
RBC: 3.04 10*6/uL — ABNORMAL LOW (ref 4.20–5.70)
RDW: 12.5 % (ref 11.1–15.7)

## 2011-11-02 LAB — RETICULOCYTES (CHCC)
RBC.: 3.1 MIL/uL — ABNORMAL LOW (ref 4.22–5.81)
Retic Ct Pct: 0.8 % (ref 0.4–2.3)

## 2011-11-02 NOTE — Progress Notes (Signed)
This office note has been dictated.

## 2011-11-02 NOTE — Progress Notes (Signed)
CC:   Alexander H. Swords, MD Alexander Morton. [name], MD, [facility] Alexander Beal, MD  DIAGNOSES: 1. Anemia of renal insufficiency. 2. Pernicious anemia.  CURRENT THERAPY: 1. Aranesp 300 mcg subcu as needed for hemoglobin less than 10. 2. Vitamin B12 1 mg IM q. month (given at home).  INTERVAL HISTORY:  Alexander Rice comes in for followup.  He is looking good.  He feels good.  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  He and about 30 guys will go to a local sports bar and watch the game.  He has had a very nice response to Aranesp  he has not needed Aranesp for probably a couple of months.  He has had no bleeding.  His back still bothers him but this is chronic.  There has been no leg swelling.  PHYSICAL EXAMINATION:  This is an elderly, thin white gentleman in no obvious distress.  Vital signs:  Temperature 96.5, pulse of 75, respiratory rate 14, blood pressure 151/69, weight is 173.  Head and neck:  A normocephalic, atraumatic skull.  There are no ocular or oral lesions.  There are no palpable cervical, supraclavicular lymph nodes. Lungs:  Clear bilaterally.  Cardiac:  Regular rate and rhythm with a normal S1 and S2.  There are no murmurs, rubs or bruits.  Abdomen:  Soft with good bowel sounds.  There is no fluid wave.  There is no palpable hepatosplenomegaly.  Extremities:  Show osteoarthritic changes of his joint.  There is good range of motion of his joints.  He has good circulation in his distal extremities.  Back:  Shows some osteoporotic- type changes.  There is no tenderness over his spine, ribs or hips. Neurologic:  No focal neurologic deficit.  LABORATORY STUDIES:  White cell count 3.9, hemoglobin 10.5, hematocrit 31.5, platelet count 294.  MCV is 104.  IMPRESSION:  Alexander Rice is a 77 year old gentleman with anemia of renal insufficiency.  He does have some degree of myelodysplasia.  He is responding nicely to the Aranesp when we do give it  to him.  We can still hold off on giving him Aranesp.  It probably will be in January or so that we need to give him some Aranesp.  He will continue his B12 at home.  I will plan to see him back in January.    ______________________________ Josph Macho, M.D. PRE/MEDQ  D:  11/02/2011  T:  11/02/2011  Job:  [id number]

## 2011-11-14 ENCOUNTER — Other Ambulatory Visit: Payer: Self-pay | Admitting: Internal Medicine

## 2011-11-14 MED ORDER — POTASSIUM CHLORIDE CRYS ER 20 MEQ PO TBCR: 20.0000 meq | EXTENDED_RELEASE_TABLET | Freq: Every day | ORAL | Status: DC

## 2011-11-14 NOTE — Telephone Encounter (Signed)
Pt called req refill of potassium chloride SA (K-DUR,KLOR-CON) 20 MEQ tablet to [facility].

## 2011-11-14 NOTE — Telephone Encounter (Signed)
rx sent in electronically 

## 2011-11-27 DIAGNOSIS — B351 Tinea unguium: Secondary | ICD-10-CM | POA: Diagnosis not present

## 2011-11-27 DIAGNOSIS — M79609 Pain in unspecified limb: Secondary | ICD-10-CM | POA: Diagnosis not present

## 2011-12-04 ENCOUNTER — Ambulatory Visit (INDEPENDENT_AMBULATORY_CARE_PROVIDER_SITE_OTHER): Payer: Medicare Other | Admitting: Internal Medicine

## 2011-12-04 DIAGNOSIS — D518 Other vitamin B12 deficiency anemias: Secondary | ICD-10-CM

## 2011-12-05 NOTE — Progress Notes (Signed)
Pt came in for b12 injection.

## 2011-12-11 DIAGNOSIS — H11439 Conjunctival hyperemia, unspecified eye: Secondary | ICD-10-CM | POA: Diagnosis not present

## 2011-12-11 DIAGNOSIS — H4011X0 Primary open-angle glaucoma, stage unspecified: Secondary | ICD-10-CM | POA: Diagnosis not present

## 2011-12-11 DIAGNOSIS — H02409 Unspecified ptosis of unspecified eyelid: Secondary | ICD-10-CM | POA: Diagnosis not present

## 2011-12-11 DIAGNOSIS — H02839 Dermatochalasis of unspecified eye, unspecified eyelid: Secondary | ICD-10-CM | POA: Diagnosis not present

## 2011-12-13 ENCOUNTER — Ambulatory Visit (HOSPITAL_BASED_OUTPATIENT_CLINIC_OR_DEPARTMENT_OTHER): Payer: Medicare Other

## 2011-12-13 ENCOUNTER — Other Ambulatory Visit (HOSPITAL_BASED_OUTPATIENT_CLINIC_OR_DEPARTMENT_OTHER): Payer: Medicare Other | Admitting: Lab

## 2011-12-13 ENCOUNTER — Ambulatory Visit (HOSPITAL_BASED_OUTPATIENT_CLINIC_OR_DEPARTMENT_OTHER): Payer: Medicare Other | Admitting: Hematology & Oncology

## 2011-12-13 DIAGNOSIS — D649 Anemia, unspecified: Secondary | ICD-10-CM

## 2011-12-13 DIAGNOSIS — N289 Disorder of kidney and ureter, unspecified: Secondary | ICD-10-CM | POA: Diagnosis not present

## 2011-12-13 DIAGNOSIS — D469 Myelodysplastic syndrome, unspecified: Secondary | ICD-10-CM | POA: Diagnosis not present

## 2011-12-13 DIAGNOSIS — N039 Chronic nephritic syndrome with unspecified morphologic changes: Secondary | ICD-10-CM | POA: Diagnosis not present

## 2011-12-13 DIAGNOSIS — N189 Chronic kidney disease, unspecified: Secondary | ICD-10-CM

## 2011-12-13 DIAGNOSIS — D631 Anemia in chronic kidney disease: Secondary | ICD-10-CM | POA: Diagnosis not present

## 2011-12-13 DIAGNOSIS — N259 Disorder resulting from impaired renal tubular function, unspecified: Secondary | ICD-10-CM

## 2011-12-13 DIAGNOSIS — D638 Anemia in other chronic diseases classified elsewhere: Secondary | ICD-10-CM | POA: Diagnosis not present

## 2011-12-13 LAB — CBC WITH DIFFERENTIAL (CANCER CENTER ONLY)
BASO%: 0.3 % (ref 0.0–2.0)
EOS%: 2.1 % (ref 0.0–7.0)
HCT: 28.2 % — ABNORMAL LOW (ref 38.7–49.9)
LYMPH%: 34.9 % (ref 14.0–48.0)
MCH: 34.4 pg — ABNORMAL HIGH (ref 28.0–33.4)
MCHC: 33.3 g/dL (ref 32.0–35.9)
MCV: 103 fL — ABNORMAL HIGH (ref 82–98)
MONO#: 0.4 10*3/uL (ref 0.1–0.9)
MONO%: 9.4 % (ref 0.0–13.0)
NEUT%: 53.3 % (ref 40.0–80.0)
RDW: 13.6 % (ref 11.1–15.7)

## 2011-12-13 LAB — FERRITIN: Ferritin: 765 ng/mL — ABNORMAL HIGH (ref 22–322)

## 2011-12-13 LAB — RETICULOCYTES (CHCC)
ABS Retic: 36.1 10*3/uL (ref 19.0–186.0)
RBC.: 2.78 MIL/uL — ABNORMAL LOW (ref 4.22–5.81)
Retic Ct Pct: 1.3 % (ref 0.4–2.3)

## 2011-12-13 LAB — IRON AND TIBC
Iron: 163 ug/dL (ref 42–165)
UIBC: 91 ug/dL — ABNORMAL LOW (ref 125–400)

## 2011-12-13 MED ORDER — DARBEPOETIN ALFA-POLYSORBATE 300 MCG/0.6ML IJ SOLN
300.0000 ug | Freq: Once | INTRAMUSCULAR | Status: AC
  Administered 2011-12-13: 300 ug via SUBCUTANEOUS

## 2011-12-13 NOTE — Progress Notes (Signed)
This office note has been dictated.

## 2011-12-13 NOTE — Patient Instructions (Signed)
Epoetin Alfa injection What is this medicine? EPOETIN ALFA (e POE e tin AL fa) helps your body make more red blood cells. This medicine is used to treat anemia caused by chronic kidney failure, cancer chemotherapy, or HIV-therapy. It may also be used before surgery if you have anemia. This medicine may be used for other purposes; ask your health care provider or pharmacist if you have questions. What should I tell my health care provider before I take this medicine? They need to know if you have any of these conditions: -blood clotting disorders -cancer patient not on chemotherapy -cystic fibrosis -heart disease, such as angina or heart failure -hemoglobin level of 12 g/dL or greater -high blood pressure -low levels of folate, iron, or vitamin B12 -seizures -an unusual or allergic reaction to erythropoietin, albumin, benzyl alcohol, hamster proteins, other medicines, foods, dyes, or preservatives -pregnant or trying to get pregnant -breast-feeding How should I use this medicine? This medicine is for injection into a vein or under the skin. It is usually given by a health care professional in a hospital or clinic setting. This list may not describe all possible interactions. Give your health care provider a list of all the medicines, herbs, non-prescription drugs, or dietary supplements you use. Also tell them if you smoke, drink alcohol, or use illegal drugs. Some items may interact with your medicine. What should I watch for while using this medicine? Visit your prescriber or health care professional for regular checks on your progress and for the needed blood tests and blood pressure measurements. It is especially important for the doctor to make sure your hemoglobin level is in the desired range, to limit the risk of potential side effects and to give you the best benefit. Keep all appointments for any recommended tests. Check your blood pressure as directed. Ask your doctor what your blood  pressure should be and when you should contact him or her. As your body makes more red blood cells, you may need to take iron, folic acid, or vitamin B supplements. Ask your doctor or health care provider which products are right for you. If you have kidney disease continue dietary restrictions, even though this medication can make you feel better. Talk with your doctor or health care professional about the foods you eat and the vitamins that you take. What side effects may I notice from receiving this medicine? Side effects that you should report to your doctor or health care professional as soon as possible: -allergic reactions like skin rash, itching or hives, swelling of the face, lips, or tongue -breathing problems -changes in vision -chest pain -confusion, trouble speaking or understanding -feeling faint or lightheaded, falls -high blood pressure -muscle aches or pains -pain, swelling, warmth in the leg -rapid weight gain -severe headaches -sudden numbness or weakness of the face, arm or leg -trouble walking, dizziness, loss of balance or coordination -seizures (convulsions) -swelling of the ankles, feet, hands -unusually weak or tired Side effects that usually do not require medical attention (report to your doctor or health care professional if they continue or are bothersome): -diarrhea -fever, chills (flu-like symptoms) -headaches -nausea, vomiting -redness, stinging, or swelling at site where injected This list may not describe all possible side effects. Call your doctor for medical advice about side effects. You may report side effects to FDA at 1-800-FDA-1088.

## 2011-12-14 NOTE — Progress Notes (Signed)
CC:   [name], MD Arturo Morton. [name], MD, [facility] Alvy Beal, MD  DIAGNOSES: 1. Anemia of renal insufficiency. 2. Pernicious anemia.  CURRENT THERAPY: 1. Aranesp 300 mcg subcutaneously as needed for hemoglobin less than     10. 2. Vitamin B12 1 mg IM every month (given at home).  INTERIM HISTORY:  Mr. Kimmel comes in for follow-up.  The last time I saw him, he was in a much better mood.  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  This was quite sobering to him.  He is going to be heading up to [place], [place] later on, I think, today or tomorrow.  There is a [employer] that he will be involved with.  He has done really well with respect to his anemia.  He has not had Aranesp now probably for about 4 or 5 months.  He is really not complaining of any kind of fatigue issues.  He has not had any problems with bleeding or bruising.  His back continues to bother him quite a bit which is not unusual.  He has had no cough.  He has had no nausea or vomiting.  He has had no fevers, sweats or chills.  When we last saw him, his ferritin was 595.  PHYSICAL EXAMINATION:  General Appearance:  This is an elderly but well- nourished white gentleman in no obvious distress.  Vital Signs:  Show a temperature of 96.9, pulse 68, respiratory rate 16, blood pressure 155/73.  Weight is 169.  Head and Neck Exam:  Shows a normocephalic, atraumatic skull.  There are no ocular or oral lesions.  There are no palpable cervical or supraclavicular lymph nodes.  Lungs:  Clear bilaterally.  Cardiac Exam:  Regular rate and rhythm with a normal S1 and S2.  There are no murmurs, rubs or bruits.  Abdominal Exam:  Soft with good bowel sounds.  There is no palpable abdominal mass.  There is no fluid wave.  There is no palpable hepatosplenomegaly.  Back Exam:  No tenderness over the spine, ribs or hips.  Extremities:  Show  no clubbing, cyanosis or edema.  He has some osteoarthritic changes in his joints.  LABORATORY STUDIES:  White cell count is 3.7, hemoglobin 9.4, hematocrit 28.2, platelet count 319.  Ferritin is 765.  His iron saturation is 64%.  IMPRESSION:  Mr. Warf is a 78 year old gentleman with anemia of renal insufficiency.  We will go ahead and give him a dose of Aranesp today.  He does very well with Aranesp.  He responds nicely to this.  We will go ahead and plan to get him back to see [place] in another 2 months. He will come in in 1 month to have his lab work checked.    ______________________________ Josph Macho, M.D. PRE/MEDQ  D:  12/13/2011  T:  12/14/2011  Job:  [id number]

## 2011-12-20 ENCOUNTER — Other Ambulatory Visit: Payer: Self-pay | Admitting: Internal Medicine

## 2011-12-20 DIAGNOSIS — Z0000 Encounter for general adult medical examination without abnormal findings: Secondary | ICD-10-CM

## 2012-01-04 ENCOUNTER — Ambulatory Visit (INDEPENDENT_AMBULATORY_CARE_PROVIDER_SITE_OTHER): Payer: Medicare Other | Admitting: Internal Medicine

## 2012-01-04 DIAGNOSIS — D518 Other vitamin B12 deficiency anemias: Secondary | ICD-10-CM

## 2012-01-04 MED ORDER — CYANOCOBALAMIN 1000 MCG/ML IJ SOLN
1000.0000 ug | Freq: Once | INTRAMUSCULAR | Status: AC
  Administered 2012-01-04: 1000 ug via INTRAMUSCULAR

## 2012-01-10 ENCOUNTER — Other Ambulatory Visit (HOSPITAL_BASED_OUTPATIENT_CLINIC_OR_DEPARTMENT_OTHER): Payer: Medicare Other | Admitting: Lab

## 2012-01-10 ENCOUNTER — Ambulatory Visit: Payer: Medicare Other

## 2012-01-10 DIAGNOSIS — D469 Myelodysplastic syndrome, unspecified: Secondary | ICD-10-CM

## 2012-01-10 DIAGNOSIS — D631 Anemia in chronic kidney disease: Secondary | ICD-10-CM

## 2012-01-10 LAB — CBC WITH DIFFERENTIAL (CANCER CENTER ONLY)
BASO%: 0.2 % (ref 0.0–2.0)
Eosinophils Absolute: 0.1 10*3/uL (ref 0.0–0.5)
HCT: 30.8 % — ABNORMAL LOW (ref 38.7–49.9)
LYMPH%: 26.9 % (ref 14.0–48.0)
MCH: 35.4 pg — ABNORMAL HIGH (ref 28.0–33.4)
MCV: 106 fL — ABNORMAL HIGH (ref 82–98)
MONO#: 0.4 10*3/uL (ref 0.1–0.9)
MONO%: 8.6 % (ref 0.0–13.0)
NEUT%: 61.1 % (ref 40.0–80.0)
Platelets: 297 10*3/uL (ref 145–400)
RDW: 13.7 % (ref 11.1–15.7)
WBC: 4.1 10*3/uL (ref 4.0–10.0)

## 2012-01-10 MED ORDER — DARBEPOETIN ALFA-POLYSORBATE 300 MCG/0.6ML IJ SOLN: 300.0000 ug | Freq: Once | INTRAMUSCULAR | Status: DC

## 2012-01-10 NOTE — Progress Notes (Signed)
Alexander Rice comes in today, CBC checked, Aranasp injection not given per Dr. Myna Hidalgo.  Hgb was 10.3.  Patient made aware. [name],  [name]

## 2012-01-17 ENCOUNTER — Telehealth: Payer: Self-pay | Admitting: Internal Medicine

## 2012-01-17 ENCOUNTER — Encounter: Payer: Self-pay | Admitting: Gastroenterology

## 2012-01-17 NOTE — Telephone Encounter (Signed)
Pt would like cindy to return his call concerning wife rx

## 2012-01-18 NOTE — Telephone Encounter (Signed)
Documented under wrong chart 

## 2012-02-01 ENCOUNTER — Ambulatory Visit (INDEPENDENT_AMBULATORY_CARE_PROVIDER_SITE_OTHER): Payer: Medicare Other | Admitting: *Deleted

## 2012-02-01 DIAGNOSIS — D518 Other vitamin B12 deficiency anemias: Secondary | ICD-10-CM | POA: Diagnosis not present

## 2012-02-04 ENCOUNTER — Ambulatory Visit: Payer: Medicare Other

## 2012-02-04 ENCOUNTER — Other Ambulatory Visit (HOSPITAL_BASED_OUTPATIENT_CLINIC_OR_DEPARTMENT_OTHER): Payer: Medicare Other | Admitting: Lab

## 2012-02-04 ENCOUNTER — Ambulatory Visit (HOSPITAL_BASED_OUTPATIENT_CLINIC_OR_DEPARTMENT_OTHER): Payer: Medicare Other | Admitting: Hematology & Oncology

## 2012-02-04 VITALS — BP 157/67 | HR 64 | Temp 97.0°F | Ht 70.0 in | Wt 168.0 lb

## 2012-02-04 DIAGNOSIS — D638 Anemia in other chronic diseases classified elsewhere: Secondary | ICD-10-CM | POA: Diagnosis not present

## 2012-02-04 DIAGNOSIS — N289 Disorder of kidney and ureter, unspecified: Secondary | ICD-10-CM

## 2012-02-04 DIAGNOSIS — D510 Vitamin B12 deficiency anemia due to intrinsic factor deficiency: Secondary | ICD-10-CM

## 2012-02-04 DIAGNOSIS — D649 Anemia, unspecified: Secondary | ICD-10-CM

## 2012-02-04 DIAGNOSIS — N189 Chronic kidney disease, unspecified: Secondary | ICD-10-CM

## 2012-02-04 DIAGNOSIS — D631 Anemia in chronic kidney disease: Secondary | ICD-10-CM

## 2012-02-04 DIAGNOSIS — D469 Myelodysplastic syndrome, unspecified: Secondary | ICD-10-CM

## 2012-02-04 LAB — CBC WITH DIFFERENTIAL (CANCER CENTER ONLY)
BASO#: 0 10*3/uL (ref 0.0–0.2)
BASO%: 0.3 % (ref 0.0–2.0)
EOS%: 2.4 % (ref 0.0–7.0)
HGB: 9.3 g/dL — ABNORMAL LOW (ref 13.0–17.1)
LYMPH#: 1 10*3/uL (ref 0.9–3.3)
MCHC: 34.3 g/dL (ref 32.0–35.9)
MONO%: 7.6 % (ref 0.0–13.0)
NEUT#: 2.4 10*3/uL (ref 1.5–6.5)
Platelets: 290 10*3/uL (ref 145–400)
RDW: 12.6 % (ref 11.1–15.7)

## 2012-02-04 LAB — BASIC METABOLIC PANEL
CO2: 22 mEq/L (ref 19–32)
Calcium: 8.5 mg/dL (ref 8.4–10.5)
Creatinine, Ser: 1.15 mg/dL (ref 0.50–1.35)
Glucose, Bld: 97 mg/dL (ref 70–99)
Sodium: 142 mEq/L (ref 135–145)

## 2012-02-04 LAB — RETICULOCYTES (CHCC)
ABS Retic: 27.2 10*3/uL (ref 19.0–186.0)
Retic Ct Pct: 1 % (ref 0.4–2.3)

## 2012-02-04 LAB — IRON AND TIBC: Iron: 234 ug/dL — ABNORMAL HIGH (ref 42–165)

## 2012-02-04 LAB — FERRITIN: Ferritin: 613 ng/mL — ABNORMAL HIGH (ref 22–322)

## 2012-02-04 MED ORDER — DARBEPOETIN ALFA-POLYSORBATE 300 MCG/0.6ML IJ SOLN: 300.0000 ug | Freq: Once | INTRAMUSCULAR | Status: DC

## 2012-02-04 NOTE — Progress Notes (Signed)
This office note has been dictated.

## 2012-02-04 NOTE — Progress Notes (Signed)
CC:   [name], MD Arturo Morton. [name], MD, [facility] Alvy Beal, MD  DIAGNOSES: 1. Anemia of renal insufficiency. 2. Pernicious anemia.  CURRENT THERAPY: 1. Aranesp 300 mcg subcutaneously as needed for hemoglobin less than     10. 2. Vitamin B12 1 mg IM every month (given at home).  INTERVAL HISTORY:  Mr. Gluth comes in for followup.  He is looking pretty good.  His wife reportedly has pneumonia right now.  She is not in the hospital, however.  He has had no issues otherwise.  He does have back problems, but this has been a chronic problem for him.  He has had no change in bowel or bladder habits.  He has had no problems with bleeding.  When we last saw him in January, his ferritin was 765 with an iron saturation of 64%.  PHYSICAL EXAMINATION:  General Appearance:  This is an elderly, thin, white gentleman in no obvious distress.  Vital Signs:  Temperature 97. Pulse 64.  Respiratory rate 16.  Blood pressure 157/67.  Weight is 168. Head and Neck Exam:  A normocephalic, atraumatic skull.  There are no ocular or oral lesions.  There are no palpable cervical or supraclavicular lymph nodes.  Lungs:  Clear bilaterally.  Cardiac Exam: Regular rate and rhythm with a normal S1 and S2.  There are no murmurs, rubs, or bruits.  Abdominal Exam:  Soft with good bowel sounds.  There is a little bit of an anterior ventral wall hernia.  There is no fluid wave.  There is no palpable hepatosplenomegaly.  Extremities:  No clubbing, cyanosis, or edema.  He has age-related osteoarthritic changes in his joints.  Back Exam:  Some slight kyphosis.  LABORATORY STUDIES:  White cell count 3.7, hemoglobin 9.3, hematocrit 27.1, platelet count 290.  IMPRESSION:  Mr. Lewman is a 78 year old gentleman with anemia of renal insufficiency.  He also has pernicious anemia.  The pernicious anemia really is not an issue.  We will go ahead and plan to give him Aranesp today.  He comes back every, I  think, month for his Aranesp.  I will plan to see him back myself in another 2-3 months.    ______________________________ Josph Macho, M.D. PRE/MEDQ  D:  02/04/2012  T:  02/04/2012  Job:  [id number]

## 2012-02-06 ENCOUNTER — Encounter: Payer: Self-pay | Admitting: Gastroenterology

## 2012-02-06 ENCOUNTER — Ambulatory Visit (INDEPENDENT_AMBULATORY_CARE_PROVIDER_SITE_OTHER): Payer: Medicare Other | Admitting: Gastroenterology

## 2012-02-06 VITALS — BP 150/72 | HR 84 | Ht 70.0 in | Wt 169.4 lb

## 2012-02-06 DIAGNOSIS — R109 Unspecified abdominal pain: Secondary | ICD-10-CM

## 2012-02-06 DIAGNOSIS — Z86010 Personal history of colonic polyps: Secondary | ICD-10-CM

## 2012-02-06 DIAGNOSIS — K219 Gastro-esophageal reflux disease without esophagitis: Secondary | ICD-10-CM

## 2012-02-06 MED ORDER — PEG-KCL-NACL-NASULF-NA ASC-C 100 G PO SOLR: 1.0000 | Freq: Once | ORAL | Status: DC

## 2012-02-06 NOTE — Progress Notes (Signed)
History of Present Illness: This is a 78 year old male is history of adenomatous colon polyps due for surveillance colonoscopy. He has a 5 to six-month history of mild lower abdominal discomfort that is sometimes exacerbated by meals and usually improves with bowel movements. Denies weight loss, , constipation, diarrhea, change in stool caliber, melena, hematochezia, nausea, vomiting, dysphagia, reflux symptoms, chest pain.  Review of Systems: Pertinent positive and negative review of systems were noted in the above HPI section. All other review of systems were otherwise negative.  Current Medications, Allergies, Past Medical History, Past Surgical History, Family History and Social History were reviewed in [employer] record.  Physical Exam: General: Well developed , well nourished, no acute distress Head: Normocephalic and atraumatic Eyes:  sclerae anicteric, EOMI Ears: Normal auditory acuity Mouth: No deformity or lesions Neck: Supple, no masses or thyromegaly Lungs: Clear throughout to auscultation Heart: Regular rate and rhythm; no murmurs, rubs or bruits Abdomen: Soft, non tender and non distended. No masses, hepatosplenomegaly or hernias noted. Normal Bowel sounds Rectal: Deferred to colonoscopy Musculoskeletal: Symmetrical with no gross deformities  Skin: No lesions on visible extremities Pulses:  Normal pulses noted Extremities: No clubbing, cyanosis, edema or deformities noted Neurological: Alert oriented x 4, grossly nonfocal Cervical Nodes:  No significant cervical adenopathy Inguinal Nodes: No significant inguinal adenopathy Psychological:  Alert and cooperative. Normal mood and affect  Assessment and Recommendations:  1. Mild lower abdominal discomfort improved with bowel movements. Increase dietary fiber and water intake. Further evaluation with colonoscopy as already planned for polyp surveillance. If no findings are noted to explain his symptoms and  his discomfort persists we can proceed with a CT scan.  2. Personal history of adenomatous colon polyps. Surveillance colonoscopy is due. The risks, benefits, and alternatives to colonoscopy with possible biopsy and possible polypectomy were discussed with the patient and they consent to proceed.   3. GERD with a history of an esophageal stricture. Reflux symptoms under good control on daily omeprazole antireflux measures. Continue current management.

## 2012-02-06 NOTE — Patient Instructions (Addendum)
You have been scheduled for a colonoscopy. Please follow written instructions given to you at your visit today.  Please pick up your prep kit at the pharmacy within the next 1-3 days.  

## 2012-02-08 ENCOUNTER — Other Ambulatory Visit: Payer: Medicare Other | Admitting: Gastroenterology

## 2012-02-12 ENCOUNTER — Telehealth: Payer: Self-pay | Admitting: Gastroenterology

## 2012-02-12 DIAGNOSIS — B351 Tinea unguium: Secondary | ICD-10-CM | POA: Diagnosis not present

## 2012-02-12 DIAGNOSIS — M79609 Pain in unspecified limb: Secondary | ICD-10-CM | POA: Diagnosis not present

## 2012-02-12 NOTE — Telephone Encounter (Signed)
Movi prep was sent to [facility] instead of [facility] by a mistake. Told patient that I will send a free unit of Mov[facility] prep to [facility] for him. Pt agreed and will pick it up today. Called [facility] to give BIN number and faxed the coupon to [facility].

## 2012-02-13 ENCOUNTER — Telehealth: Payer: Self-pay | Admitting: Gastroenterology

## 2012-02-13 NOTE — Telephone Encounter (Signed)
Called pharmacy and told T[facility] at [facility] that I called the free voucher in yesterday and faxed it to them to have this ready for the patient to pick up free. Told Tameka that the patient came there to pick it up and they tried to charge him. Tameka took my BIN, Group, and ID number off the voucher and states it should be fixed now. Called the patient and notified that it should be fixed now and to go pick it up only if the Movi prep is free. Pt agreed.

## 2012-02-14 ENCOUNTER — Telehealth: Payer: Self-pay | Admitting: Gastroenterology

## 2012-02-14 DIAGNOSIS — Z86010 Personal history of colonic polyps: Secondary | ICD-10-CM

## 2012-02-14 DIAGNOSIS — R109 Unspecified abdominal pain: Secondary | ICD-10-CM

## 2012-02-18 MED ORDER — PEG-KCL-NACL-NASULF-NA ASC-C 100 G PO SOLR: 1.0000 | Freq: Once | ORAL | Status: DC

## 2012-02-18 NOTE — Telephone Encounter (Signed)
Resent the prescription for Movi prep to the pharmacy.

## 2012-02-19 ENCOUNTER — Ambulatory Visit (AMBULATORY_SURGERY_CENTER): Payer: Medicare Other | Admitting: Gastroenterology

## 2012-02-19 ENCOUNTER — Encounter: Payer: Self-pay | Admitting: Gastroenterology

## 2012-02-19 VITALS — BP 186/77 | HR 64 | Temp 96.8°F | Resp 18 | Ht 70.0 in | Wt 169.0 lb

## 2012-02-19 DIAGNOSIS — I10 Essential (primary) hypertension: Secondary | ICD-10-CM | POA: Diagnosis not present

## 2012-02-19 DIAGNOSIS — Z86010 Personal history of colonic polyps: Secondary | ICD-10-CM | POA: Diagnosis not present

## 2012-02-19 DIAGNOSIS — K219 Gastro-esophageal reflux disease without esophagitis: Secondary | ICD-10-CM | POA: Diagnosis not present

## 2012-02-19 DIAGNOSIS — R109 Unspecified abdominal pain: Secondary | ICD-10-CM | POA: Diagnosis not present

## 2012-02-19 DIAGNOSIS — D126 Benign neoplasm of colon, unspecified: Secondary | ICD-10-CM

## 2012-02-19 DIAGNOSIS — Z1211 Encounter for screening for malignant neoplasm of colon: Secondary | ICD-10-CM | POA: Diagnosis not present

## 2012-02-19 DIAGNOSIS — D649 Anemia, unspecified: Secondary | ICD-10-CM | POA: Diagnosis not present

## 2012-02-19 DIAGNOSIS — I2510 Atherosclerotic heart disease of native coronary artery without angina pectoris: Secondary | ICD-10-CM | POA: Diagnosis not present

## 2012-02-19 DIAGNOSIS — E785 Hyperlipidemia, unspecified: Secondary | ICD-10-CM | POA: Diagnosis not present

## 2012-02-19 MED ORDER — SODIUM CHLORIDE 0.9 % IV SOLN: 500.0000 mL | INTRAVENOUS | Status: DC

## 2012-02-19 NOTE — Patient Instructions (Signed)
Resume your prior medications today.  Handouts given on polyps, diverticulosis, high fiber diet with liberal fluid intake.  Please call if any questions or concerns.   YOU HAD AN ENDOSCOPIC PROCEDURE TODAY AT THE [facility]: Refer to the procedure report that was given to you for any specific questions about what was found during the examination.  If the procedure report does not answer your questions, please call your gastroenterologist to clarify.  If you requested that your care partner not be given the details of your procedure findings, then the procedure report has been included in a sealed envelope for you to review at your convenience later.  YOU SHOULD EXPECT: Some feelings of bloating in the abdomen. Passage of more gas than usual.  Walking can help get rid of the air that was put into your GI tract during the procedure and reduce the bloating. If you had a lower endoscopy (such as a colonoscopy or flexible sigmoidoscopy) you may notice spotting of blood in your stool or on the toilet paper. If you underwent a bowel prep for your procedure, then you may not have a normal bowel movement for a few days.  DIET: Your first meal following the procedure should be a light meal and then it is ok to progress to your normal diet.  A half-sandwich or bowl of soup is an example of a good first meal.  Heavy or fried foods are harder to digest and may make you feel nauseous or bloated.  Likewise meals heavy in dairy and vegetables can cause extra gas to form and this can also increase the bloating.  Drink plenty of fluids but you should avoid alcoholic beverages for 24 hours.  ACTIVITY: Your care partner should take you home directly after the procedure.  You should plan to take it easy, moving slowly for the rest of the day.  You can resume normal activity the day after the procedure however you should NOT DRIVE or use heavy machinery for 24 hours (because of the sedation medicines used during  the test).    SYMPTOMS TO REPORT IMMEDIATELY: A gastroenterologist can be reached at any hour.  During normal business hours, 8:30 AM to 5:00 PM Monday through Friday, call [phone].  After hours and on weekends, please call the GI answering service at [phone] who will take a message and have the physician on call contact you.   Following lower endoscopy (colonoscopy or flexible sigmoidoscopy):  Excessive amounts of blood in the stool  Significant tenderness or worsening of abdominal pains  Swelling of the abdomen that is new, acute  Fever of 100F or higher    FOLLOW UP: If any biopsies were taken you will be contacted by phone or by letter within the next 1-3 weeks.  Call your gastroenterologist if you have not heard about the biopsies in 3 weeks.  Our staff will call the home number listed on your records the next business day following your procedure to check on you and address any questions or concerns that you may have at that time regarding the information given to you following your procedure. This is a courtesy call and so if there is no answer at the home number and we have not heard from you through the emergency physician on call, we will assume that you have returned to your regular daily activities without incident.  SIGNATURES/CONFIDENTIALITY: You and/or your care partner have signed paperwork which will be entered into your electronic medical record.  These signatures attest to the fact that that the information above on your After Visit Summary has been reviewed and is understood.  Full responsibility of the confidentiality of this discharge information lies with you and/or your care-partner.  

## 2012-02-19 NOTE — Op Note (Signed)
[facility] [address] [place], [place]  [address]  COLONOSCOPY PROCEDURE REPORT  PATIENT:  Alexander Rice, Alexander Rice  MR#:  [id number] BIRTHDATE:  1931/11/23, 79 yrs. old  GENDER:  male ENDOSCOPIST:  Judie Petit T. Russella Dar, MD, [facility]  PROCEDURE DATE:  02/19/2012 PROCEDURE:  Colonoscopy with biopsy and snare polypectomy ASA CLASS:  Class III INDICATIONS:  1) surveillance and high-risk screening  2) history of pre-cancerous (adenomatous) colon polyps: 1997 MEDICATIONS:   MAC sedation, administered by CRNA, propofol (Diprivan) 140 mg IV DESCRIPTION OF PROCEDURE:   After the risks benefits and alternatives of the procedure were thoroughly explained, informed consent was obtained.  Digital rectal exam was performed and revealed no abnormalities.   The LB CF-H180AL [id number] endoscope was introduced through the anus and advanced to the cecum, which was identified by both the appendix and ileocecal valve, without limitations.  The quality of the prep was excellent, using MoviPrep.  The instrument was then slowly withdrawn as the colon was fully examined. 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 It was non-bleeding and was 4 mm in size.  A sessile polyp was found in the proximal transverse colon. It was 4 mm in size. The polyp was removed using cold biopsy forceps.  A sessile polyp was found in the descending colon. It was 5 mm in size. Polyp was snared without cautery. Retrieval was successful. Mild diverticulosis was found in the sigmoid colon.  Otherwise normal colonoscopy without other polyps, masses, vascular ectasias, or inflammatory changes.   Retroflexed views in the rectum revealed no abnormalities.  The time to cecum =  5  minutes. The scope was then withdrawn (time =  10  min) from the patient and the procedure completed.  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 3) Given your age, you will not need another colonoscopy for colon cancer screening or polyp surveillance. These types of tests usually stop around the age 75.  Venita Lick. Russella Dar, MD, [facility]  n. eSIGNED:   Venita Lick. Addie Cederberg at 02/19/2012 11:19 AM  Pete Pelt, [phone]

## 2012-02-19 NOTE — Progress Notes (Deleted)
The pt abdomen was firm and he did pass some gas, but c/o abdominal pain.  Levsin was administered sublinqually.  Maw  Pt up to the rest room and only passed a small amount of gas.  Maw  Rectal tube passed and pt did pass gas.  Maw

## 2012-02-19 NOTE — Progress Notes (Signed)
Patient did not experience any of the following events: a burn prior to discharge; a fall within the facility; wrong site/side/patient/procedure/implant event; or a hospital transfer or hospital admission upon discharge from the facility. (G8907) Patient did not have preoperative order for IV antibiotic SSI prophylaxis. (G8918)  

## 2012-02-19 NOTE — Progress Notes (Signed)
The above not was charted in the wrong chart. Maw

## 2012-02-20 ENCOUNTER — Telehealth: Payer: Self-pay | Admitting: *Deleted

## 2012-02-20 NOTE — Telephone Encounter (Signed)
Left message on number that was given in admitting yesterday as directed. ewm

## 2012-02-25 ENCOUNTER — Encounter: Payer: Self-pay | Admitting: Gastroenterology

## 2012-03-06 ENCOUNTER — Ambulatory Visit (HOSPITAL_BASED_OUTPATIENT_CLINIC_OR_DEPARTMENT_OTHER): Payer: Medicare Other

## 2012-03-06 ENCOUNTER — Other Ambulatory Visit (HOSPITAL_BASED_OUTPATIENT_CLINIC_OR_DEPARTMENT_OTHER): Payer: Medicare Other | Admitting: Lab

## 2012-03-06 ENCOUNTER — Ambulatory Visit (INDEPENDENT_AMBULATORY_CARE_PROVIDER_SITE_OTHER): Payer: Medicare Other | Admitting: Internal Medicine

## 2012-03-06 VITALS — BP 134/66 | HR 69 | Temp 96.6°F

## 2012-03-06 DIAGNOSIS — D649 Anemia, unspecified: Secondary | ICD-10-CM | POA: Diagnosis not present

## 2012-03-06 DIAGNOSIS — N189 Chronic kidney disease, unspecified: Secondary | ICD-10-CM

## 2012-03-06 DIAGNOSIS — D469 Myelodysplastic syndrome, unspecified: Secondary | ICD-10-CM

## 2012-03-06 DIAGNOSIS — D631 Anemia in chronic kidney disease: Secondary | ICD-10-CM

## 2012-03-06 DIAGNOSIS — N289 Disorder of kidney and ureter, unspecified: Secondary | ICD-10-CM | POA: Diagnosis not present

## 2012-03-06 DIAGNOSIS — D518 Other vitamin B12 deficiency anemias: Secondary | ICD-10-CM

## 2012-03-06 LAB — CBC WITH DIFFERENTIAL (CANCER CENTER ONLY)
BASO#: 0 10*3/uL (ref 0.0–0.2)
Eosinophils Absolute: 0.1 10*3/uL (ref 0.0–0.5)
HGB: 9.6 g/dL — ABNORMAL LOW (ref 13.0–17.1)
LYMPH%: 20.4 % (ref 14.0–48.0)
MCH: 35.6 pg — ABNORMAL HIGH (ref 28.0–33.4)
MCV: 107 fL — ABNORMAL HIGH (ref 82–98)
MONO#: 0.4 10*3/uL (ref 0.1–0.9)
Platelets: 312 10*3/uL (ref 145–400)
RBC: 2.7 10*6/uL — ABNORMAL LOW (ref 4.20–5.70)
WBC: 5 10*3/uL (ref 4.0–10.0)

## 2012-03-06 MED ORDER — CYANOCOBALAMIN 1000 MCG/ML IJ SOLN
1000.0000 ug | Freq: Once | INTRAMUSCULAR | Status: AC
  Administered 2012-03-06: 1000 ug via INTRAMUSCULAR

## 2012-03-06 MED ORDER — DARBEPOETIN ALFA-POLYSORBATE 300 MCG/0.6ML IJ SOLN
300.0000 ug | Freq: Once | INTRAMUSCULAR | Status: AC
  Administered 2012-03-06: 300 ug via SUBCUTANEOUS

## 2012-03-10 DIAGNOSIS — H2510 Age-related nuclear cataract, unspecified eye: Secondary | ICD-10-CM | POA: Diagnosis not present

## 2012-03-10 DIAGNOSIS — H02839 Dermatochalasis of unspecified eye, unspecified eyelid: Secondary | ICD-10-CM | POA: Diagnosis not present

## 2012-03-10 DIAGNOSIS — H02409 Unspecified ptosis of unspecified eyelid: Secondary | ICD-10-CM | POA: Diagnosis not present

## 2012-03-10 DIAGNOSIS — H4011X0 Primary open-angle glaucoma, stage unspecified: Secondary | ICD-10-CM | POA: Diagnosis not present

## 2012-03-10 DIAGNOSIS — H43819 Vitreous degeneration, unspecified eye: Secondary | ICD-10-CM | POA: Diagnosis not present

## 2012-03-10 DIAGNOSIS — H409 Unspecified glaucoma: Secondary | ICD-10-CM | POA: Diagnosis not present

## 2012-03-18 ENCOUNTER — Telehealth: Payer: Self-pay | Admitting: Gastroenterology

## 2012-03-18 NOTE — Telephone Encounter (Signed)
Patient is having continued abdominal pain and the OTC products Align are not helping.  He will come in and see Dr Russella Dar on 03/24/12

## 2012-03-24 ENCOUNTER — Other Ambulatory Visit (INDEPENDENT_AMBULATORY_CARE_PROVIDER_SITE_OTHER): Payer: Medicare Other

## 2012-03-24 ENCOUNTER — Encounter: Payer: Self-pay | Admitting: Gastroenterology

## 2012-03-24 ENCOUNTER — Ambulatory Visit (INDEPENDENT_AMBULATORY_CARE_PROVIDER_SITE_OTHER): Payer: Medicare Other | Admitting: Gastroenterology

## 2012-03-24 VITALS — BP 160/78 | HR 64 | Wt 168.0 lb

## 2012-03-24 DIAGNOSIS — Z86010 Personal history of colonic polyps: Secondary | ICD-10-CM

## 2012-03-24 DIAGNOSIS — R109 Unspecified abdominal pain: Secondary | ICD-10-CM

## 2012-03-24 LAB — BASIC METABOLIC PANEL
BUN: 23 mg/dL (ref 6–23)
CO2: 26 mEq/L (ref 19–32)
Glucose, Bld: 99 mg/dL (ref 70–99)
Potassium: 4.3 mEq/L (ref 3.5–5.1)
Sodium: 142 mEq/L (ref 135–145)

## 2012-03-24 NOTE — Patient Instructions (Addendum)
Your physician has requested that you go to the basement for the following lab work before leaving today:Bmet.  You have been scheduled for a CT scan of the abdomen and pelvis at [facility] ([address]---this is in the same building as Architectural technologist).   You are scheduled on 03/26/12 at 11:00am. You should arrive 15 minutes prior to your appointment time for registration. Please follow the written instructions below on the day of your exam:  WARNING: IF YOU ARE ALLERGIC TO IODINE/X-RAY DYE, PLEASE NOTIFY RADIOLOGY IMMEDIATELY AT [phone]! YOU WILL BE GIVEN A 13 HOUR PREMEDICATION PREP.  1) Do not eat or drink anything after 7:00am (4 hours prior to your test) 2) You have been given 2 bottles of oral contrast to drink. The solution may taste better if refrigerated, but do NOT add ice or any other liquid to this solution. Shake well before drinking.    Drink 1 bottle of contrast @ 9:00am (2 hours prior to your exam)  Drink 1 bottle of contrast @ 10:00am (1 hour prior to your exam)  You may take any medications as prescribed with a small amount of water except for the following: Metformin, Glucophage, Glucovance, Avandamet, Riomet, Fortamet, Actoplus Met, Janumet, Glumetza or Metaglip. The above medications must be held the day of the exam AND 48 hours after the exam.  The purpose of you drinking the oral contrast is to aid in the visualization of your intestinal tract. The contrast solution may cause some diarrhea. Before your exam is started, you will be given a small amount of fluid to drink. Depending on your individual set of symptoms, you may also receive an intravenous injection of x-ray contrast/dye. Plan on being at [facility] for 30 minutes or long, depending on the type of exam you are having performed.  If you have any questions regarding your exam or if you need to reschedule, you may call the CT department at [phone] between the hours of 8:00 am and  5:00 pm, Monday-Friday.  ________________________________________________________________________  cc: Birdie Sons, MD

## 2012-03-24 NOTE — Progress Notes (Signed)
History of Present Illness: This is a 78 year old male with lower abdominal fullness for 7 weeks. His recent colonoscopy only showed small polyps and AVM. He has chronic lower back pain and standing exacerbates his lower back pain. He notes a slight decrease in the fullness after bowel movements but the fullness persists. Denies weight loss, constipation, diarrhea, change in stool caliber, melena, hematochezia, nausea, vomiting, dysphagia, reflux symptoms, chest pain.  Current Medications, Allergies, Past Medical History, Past Surgical History, Family History and Social History were reviewed in [employer] record.  Physical Exam: General: Well developed , well nourished, no acute distress Head: Normocephalic and atraumatic Eyes:  sclerae anicteric, EOMI Ears: Normal auditory acuity Mouth: No deformity or lesions Lungs: Clear throughout to auscultation Heart: Regular rate and rhythm; no murmurs, rubs or bruits Abdomen: Soft, non tender and non distended. No masses, hepatosplenomegaly or hernias noted. Normal Bowel sounds Musculoskeletal: Symmetrical with no gross deformities  Pulses:  Normal pulses noted Extremities: No clubbing, cyanosis, edema or deformities noted Neurological: Alert oriented x 4, grossly nonfocal Psychological:  Alert and cooperative. Normal mood and affect  Assessment and Recommendations:  1. Lower abdominal pain and fullness. Etiology unclear. Rule out symptoms referred from his lower back. Schedule abdominal/pelvic CT.  2. Personal history of adenomatous colon polyps. No future surveillance colonoscopies plan due to age and comorbidities.  3. Colonic AVM  4. Sigmoid diverticulosis. Long-term high fiber diet.

## 2012-03-26 ENCOUNTER — Ambulatory Visit (INDEPENDENT_AMBULATORY_CARE_PROVIDER_SITE_OTHER)
Admission: RE | Admit: 2012-03-26 | Discharge: 2012-03-26 | Disposition: A | Discharge status: Home / Self Care | Payer: Medicare Other | Source: Ambulatory Visit | Attending: Gastroenterology | Admitting: Gastroenterology

## 2012-03-26 ENCOUNTER — Other Ambulatory Visit: Payer: Self-pay

## 2012-03-26 DIAGNOSIS — R109 Unspecified abdominal pain: Secondary | ICD-10-CM

## 2012-03-26 DIAGNOSIS — K8020 Calculus of gallbladder without cholecystitis without obstruction: Secondary | ICD-10-CM | POA: Diagnosis not present

## 2012-03-26 DIAGNOSIS — I714 Abdominal aortic aneurysm, without rupture: Secondary | ICD-10-CM | POA: Diagnosis not present

## 2012-03-26 DIAGNOSIS — R911 Solitary pulmonary nodule: Secondary | ICD-10-CM

## 2012-03-26 DIAGNOSIS — N189 Chronic kidney disease, unspecified: Secondary | ICD-10-CM | POA: Diagnosis not present

## 2012-03-26 DIAGNOSIS — R933 Abnormal findings on diagnostic imaging of other parts of digestive tract: Secondary | ICD-10-CM

## 2012-03-26 MED ORDER — IOHEXOL 300 MG/ML  SOLN
100.0000 mL | Freq: Once | INTRAMUSCULAR | Status: AC | PRN
  Administered 2012-03-26: 100 mL via INTRAVENOUS

## 2012-04-01 ENCOUNTER — Ambulatory Visit (INDEPENDENT_AMBULATORY_CARE_PROVIDER_SITE_OTHER)
Admission: RE | Admit: 2012-04-01 | Discharge: 2012-04-01 | Disposition: A | Discharge status: Home / Self Care | Payer: Medicare Other | Source: Ambulatory Visit | Attending: Gastroenterology | Admitting: Gastroenterology

## 2012-04-01 DIAGNOSIS — R933 Abnormal findings on diagnostic imaging of other parts of digestive tract: Secondary | ICD-10-CM

## 2012-04-01 DIAGNOSIS — K8020 Calculus of gallbladder without cholecystitis without obstruction: Secondary | ICD-10-CM | POA: Diagnosis not present

## 2012-04-01 DIAGNOSIS — I700 Atherosclerosis of aorta: Secondary | ICD-10-CM | POA: Diagnosis not present

## 2012-04-01 MED ORDER — IOHEXOL 300 MG/ML  SOLN
100.0000 mL | Freq: Once | INTRAMUSCULAR | Status: AC | PRN
  Administered 2012-04-01: 100 mL via INTRAVENOUS

## 2012-04-02 ENCOUNTER — Ambulatory Visit (INDEPENDENT_AMBULATORY_CARE_PROVIDER_SITE_OTHER): Payer: Medicare Other | Admitting: Internal Medicine

## 2012-04-02 DIAGNOSIS — E538 Deficiency of other specified B group vitamins: Secondary | ICD-10-CM

## 2012-04-02 DIAGNOSIS — D518 Other vitamin B12 deficiency anemias: Secondary | ICD-10-CM

## 2012-04-02 MED ORDER — CYANOCOBALAMIN 1000 MCG/ML IJ SOLN
1000.0000 ug | Freq: Once | INTRAMUSCULAR | Status: AC
  Administered 2012-05-05: 1000 ug via INTRAMUSCULAR

## 2012-04-03 ENCOUNTER — Ambulatory Visit (HOSPITAL_BASED_OUTPATIENT_CLINIC_OR_DEPARTMENT_OTHER): Payer: Medicare Other

## 2012-04-03 ENCOUNTER — Other Ambulatory Visit (HOSPITAL_BASED_OUTPATIENT_CLINIC_OR_DEPARTMENT_OTHER): Payer: Medicare Other | Admitting: Lab

## 2012-04-03 VITALS — BP 128/60 | HR 77 | Temp 97.0°F

## 2012-04-03 DIAGNOSIS — N289 Disorder of kidney and ureter, unspecified: Secondary | ICD-10-CM

## 2012-04-03 DIAGNOSIS — D649 Anemia, unspecified: Secondary | ICD-10-CM

## 2012-04-03 DIAGNOSIS — N189 Chronic kidney disease, unspecified: Secondary | ICD-10-CM

## 2012-04-03 DIAGNOSIS — I10 Essential (primary) hypertension: Secondary | ICD-10-CM

## 2012-04-03 DIAGNOSIS — D469 Myelodysplastic syndrome, unspecified: Secondary | ICD-10-CM

## 2012-04-03 DIAGNOSIS — N039 Chronic nephritic syndrome with unspecified morphologic changes: Secondary | ICD-10-CM | POA: Diagnosis not present

## 2012-04-03 LAB — IRON AND TIBC
%SAT: 79 % — ABNORMAL HIGH (ref 20–55)
Iron: 168 ug/dL — ABNORMAL HIGH (ref 42–165)

## 2012-04-03 LAB — CBC WITH DIFFERENTIAL (CANCER CENTER ONLY)
BASO#: 0 10*3/uL (ref 0.0–0.2)
EOS%: 3.3 % (ref 0.0–7.0)
Eosinophils Absolute: 0.1 10*3/uL (ref 0.0–0.5)
HCT: 30.8 % — ABNORMAL LOW (ref 38.7–49.9)
HGB: 10.6 g/dL — ABNORMAL LOW (ref 13.0–17.1)
LYMPH%: 27.7 % (ref 14.0–48.0)
MCH: 36.3 pg — ABNORMAL HIGH (ref 28.0–33.4)
MCHC: 34.4 g/dL (ref 32.0–35.9)
MCV: 106 fL — ABNORMAL HIGH (ref 82–98)
MONO%: 7.9 % (ref 0.0–13.0)
NEUT%: 60.8 % (ref 40.0–80.0)
RBC: 2.92 10*6/uL — ABNORMAL LOW (ref 4.20–5.70)

## 2012-04-03 LAB — RETICULOCYTES (CHCC)
RBC.: 3.03 MIL/uL — ABNORMAL LOW (ref 4.22–5.81)
Retic Ct Pct: 0.6 % (ref 0.4–2.3)

## 2012-04-03 MED ORDER — DARBEPOETIN ALFA-POLYSORBATE 300 MCG/0.6ML IJ SOLN
300.0000 ug | Freq: Once | INTRAMUSCULAR | Status: AC
  Administered 2012-04-03: 300 ug via SUBCUTANEOUS

## 2012-04-08 ENCOUNTER — Ambulatory Visit (INDEPENDENT_AMBULATORY_CARE_PROVIDER_SITE_OTHER): Payer: Medicare Other | Admitting: Internal Medicine

## 2012-04-08 ENCOUNTER — Encounter: Payer: Self-pay | Admitting: Internal Medicine

## 2012-04-08 ENCOUNTER — Ambulatory Visit (INDEPENDENT_AMBULATORY_CARE_PROVIDER_SITE_OTHER)
Admission: RE | Admit: 2012-04-08 | Discharge: 2012-04-08 | Disposition: A | Discharge status: Home / Self Care | Payer: Medicare Other | Source: Ambulatory Visit | Attending: Internal Medicine | Admitting: Internal Medicine

## 2012-04-08 VITALS — BP 138/64 | HR 79 | Temp 97.8°F | Ht 69.0 in | Wt 167.0 lb

## 2012-04-08 DIAGNOSIS — R911 Solitary pulmonary nodule: Secondary | ICD-10-CM

## 2012-04-08 DIAGNOSIS — J984 Other disorders of lung: Secondary | ICD-10-CM | POA: Diagnosis not present

## 2012-04-08 DIAGNOSIS — I10 Essential (primary) hypertension: Secondary | ICD-10-CM | POA: Diagnosis not present

## 2012-04-08 NOTE — Progress Notes (Signed)
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   04/08/2012 1st pulmonary eval cc "stomach pain" > CT ABD c/w 5 mm nodule Lingula but no cough  has hb that resolved on prilosec then discomfort bilateral below umbilicus x 2 months variable severity, intermittent ?  assoc with mild constipation and some relief p bm. No pain with cough, no sob.  Sleeping ok without nocturnal  or early am exacerbation  of respiratory  c/o's . Also denies any obvious fluctuation of symptoms with weather or environmental changes or other aggravating or alleviating factors except as outlined above   ROS  At present neg for  any significant sore throat, dysphagia, dental problems, itching, sneezing,  nasal congestion or excess/ purulent secretions, ear ache,   fever, chills, sweats, unintended wt loss, pleuritic or exertional cp, hemoptysis, palpitations, orthopnea pnd or leg swelling.  Also denies presyncope, palpitations, heartburn,   anorexia, nausea, vomiting, diarrhea  or change in bowel or urinary habits, change in stools or urine, dysuria,hematuria,  rash, arthralgias, visual complaints, headache, numbness weakness or ataxia or problems with walking or coordination. No noted change in mood/affect or memory.         Review of Systems     Objective:   Physical Exam amb pleasant wm nad Wt Readings from Last 3 Encounters:  04/08/12 167 lb (75.751 kg)  03/24/12 168 lb (76.204 kg)  02/19/12 169 lb (76.658 kg)    HEENT: nl dentition, turbinates, and orophanx. Nl external ear canals without cough reflex   NECK :  without JVD/Nodes/TM/ nl carotid upstrokes bilaterally   LUNGS: no acc muscle use, clear to A and P bilaterally without cough on insp or exp maneuvers   CV:  RRR  no s3 or murmur or increase in P2, no  edema   ABD:  soft and nontender with nl excursion in the supine position. No bruits or organomegaly, bowel sounds nl  MS:  warm without deformities, calf tenderness, cyanosis or clubbing  SKIN: warm and dry without lesions    NEURO:  alert, approp, no deficits  CXR  04/08/2012 :  WNL, no gross abnormalities      Assessment:          Plan:

## 2012-04-08 NOTE — Assessment & Plan Note (Addendum)
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  Discussed in detail all the  indications, usual  risks and alternatives  relative to the benefits with patient who agrees with this conservative approach.

## 2012-04-08 NOTE — Patient Instructions (Signed)
Please remember to go to the x-ray department downstairs for your tests - we will call you with the results when they are available.    The nodule on your lung is very small and probably cannot be seen on a plain cxr so the best way to follow this up in low risk situation like yours is to get a ct chest in May 2014 (we have you in our file for recall).

## 2012-04-09 ENCOUNTER — Telehealth: Payer: Self-pay | Admitting: Internal Medicine

## 2012-04-09 NOTE — Telephone Encounter (Signed)
Notes Recorded by Nyoka Cowden, MD on 04/08/2012 at 2:50 PM Call pt: Reviewed cxr and no acute change so no change in recommendations made at ov - no other nodules seen which is good news  I spoke with patient about results and he verbalized understanding and had no questions

## 2012-04-15 ENCOUNTER — Ambulatory Visit (AMBULATORY_SURGERY_CENTER): Payer: Medicare Other

## 2012-04-15 VITALS — Ht 69.0 in | Wt 166.1 lb

## 2012-04-15 DIAGNOSIS — R140 Abdominal distension (gaseous): Secondary | ICD-10-CM

## 2012-04-15 DIAGNOSIS — R143 Flatulence: Secondary | ICD-10-CM

## 2012-04-15 DIAGNOSIS — R109 Unspecified abdominal pain: Secondary | ICD-10-CM

## 2012-04-18 ENCOUNTER — Encounter: Payer: Self-pay | Admitting: Gastroenterology

## 2012-04-18 ENCOUNTER — Ambulatory Visit (AMBULATORY_SURGERY_CENTER): Payer: Medicare Other | Admitting: Gastroenterology

## 2012-04-18 VITALS — BP 175/72 | HR 51 | Temp 96.1°F | Resp 11 | Ht 69.0 in | Wt 166.0 lb

## 2012-04-18 DIAGNOSIS — R109 Unspecified abdominal pain: Secondary | ICD-10-CM | POA: Diagnosis not present

## 2012-04-18 DIAGNOSIS — R933 Abnormal findings on diagnostic imaging of other parts of digestive tract: Secondary | ICD-10-CM

## 2012-04-18 MED ORDER — SODIUM CHLORIDE 0.9 % IV SOLN: 500.0000 mL | INTRAVENOUS | Status: DC

## 2012-04-18 NOTE — Op Note (Signed)
[facility] [address] [place], [place]  [address]  ENDOSCOPY PROCEDURE REPORT  PATIENT:  Alexander, Rice  MR#:  [id number] BIRTHDATE:  1932/05/03, 79 yrs. old  GENDER:  male ENDOSCOPIST:  Judie Petit T. Russella Dar, MD, [facility]  PROCEDURE DATE:  04/18/2012 PROCEDURE:  EGD, diagnostic 43235 ASA CLASS:  Class III INDICATIONS:  abdominal pain, multiple sites, abnormal imaging-thickened gastric wall on CT MEDICATIONS:    These medications were titrated to patient response per physician's verbal order, Fentanyl 25 mcg IV, Versed 4 mg IV TOPICAL ANESTHETIC:  Cetacaine Spray DESCRIPTION OF PROCEDURE:   After the risks benefits and alternatives of the procedure were thoroughly explained, informed consent was obtained.  The LB GIF-H180 [id number] endoscope was introduced through the mouth and advanced to the second portion of the duodenum, without limitations.  The instrument was slowly withdrawn as the mucosa was fully examined. <<PROCEDUREIMAGES>> The esophagus and gastroesophageal junction were completely normal in appearance.  The stomach was entered and closely examined. The pylorus, antrum, angularis, and lesser curvature were well visualized, including a retroflexed view of the cardia and fundus. The stomach wall was normally distensable. The scope passed easily through the pylorus into the duodenum.  The duodenal bulb was normal in appearance, as was the postbulbar duodenum.  Retroflexed views revealed a hiatal hernia,  3-4 cm.  The scope was then withdrawn from the patient and the procedure completed.  COMPLICATIONS:  None  ENDOSCOPIC IMPRESSION: 1) Hiatal hernia  RECOMMENDATIONS: 1) Anti-reflux regimen 2) Continue PPI  Keishawn Darsey T. Russella Dar, MD, [facility]  n. eSIGNED:   Venita Lick. Tahjay Binion at 04/18/2012 01:41 PM  Pete Pelt, [phone]

## 2012-04-18 NOTE — Progress Notes (Signed)
Patient did not experience any of the following events: a burn prior to discharge; a fall within the facility; wrong site/side/patient/procedure/implant event; or a hospital transfer or hospital admission upon discharge from the facility. (G8907) Patient did not have preoperative order for IV antibiotic SSI prophylaxis. (G8918)  

## 2012-04-18 NOTE — Patient Instructions (Signed)
YOU HAD AN ENDOSCOPIC PROCEDURE TODAY AT THE [facility]: Refer to the procedure report that was given to you for any specific questions about what was found during the examination.  If the procedure report does not answer your questions, please call your gastroenterologist to clarify.  If you requested that your care partner not be given the details of your procedure findings, then the procedure report has been included in a sealed envelope for you to review at your convenience later.  YOU SHOULD EXPECT: Some feelings of bloating in the abdomen. Passage of more gas than usual.  Walking can help get rid of the air that was put into your GI tract during the procedure and reduce the bloating. If you had a lower endoscopy (such as a colonoscopy or flexible sigmoidoscopy) you may notice spotting of blood in your stool or on the toilet paper. If you underwent a bowel prep for your procedure, then you may not have a normal bowel movement for a few days.  DIET: Your first meal following the procedure should be a light meal and then it is ok to progress to your normal diet.  A half-sandwich or bowl of soup is an example of a good first meal.  Heavy or fried foods are harder to digest and may make you feel nauseous or bloated.  Likewise meals heavy in dairy and vegetables can cause extra gas to form and this can also increase the bloating.  Drink plenty of fluids but you should avoid alcoholic beverages for 24 hours.  ACTIVITY: Your care partner should take you home directly after the procedure.  You should plan to take it easy, moving slowly for the rest of the day.  You can resume normal activity the day after the procedure however you should NOT DRIVE or use heavy machinery for 24 hours (because of the sedation medicines used during the test).    SYMPTOMS TO REPORT IMMEDIATELY: A gastroenterologist can be reached at any hour.  During normal business hours, 8:30 AM to 5:00 PM Monday through Friday,  call [phone].  After hours and on weekends, please call the GI answering service at [phone] who will take a message and have the physician on call contact you.  Following upper endoscopy (EGD)  Vomiting of blood or coffee ground material  New chest pain or pain under the shoulder blades  Painful or persistently difficult swallowing  New shortness of breath  Fever of 100F or higher  Black, tarry-looking stools  FOLLOW UP: If any biopsies were taken you will be contacted by phone or by letter within the next 1-3 weeks.  Call your gastroenterologist if you have not heard about the biopsies in 3 weeks.  Our staff will call the home number listed on your records the next business day following your procedure to check on you and address any questions or concerns that you may have at that time regarding the information given to you following your procedure. This is a courtesy call and so if there is no answer at the home number and we have not heard from you through the emergency physician on call, we will assume that you have returned to your regular daily activities without incident.  SIGNATURES/CONFIDENTIALITY: You and/or your care partner have signed paperwork which will be entered into your electronic medical record.  These signatures attest to the fact that that the information above on your After Visit Summary has been reviewed and is understood.  Full responsibility of   the confidentiality of this discharge information lies with you and/or your care-partner. 

## 2012-04-21 ENCOUNTER — Telehealth: Payer: Self-pay | Admitting: *Deleted

## 2012-04-21 ENCOUNTER — Telehealth: Payer: Self-pay | Admitting: Gastroenterology

## 2012-04-21 NOTE — Telephone Encounter (Signed)
  Left message for pt. To call us if necessary.

## 2012-04-21 NOTE — Telephone Encounter (Signed)
lcolm Dellie Burns, MD,FACG         Sent:  Mon April 21, 2012 4:18 PM   To:  Rossie Muskrat, [facility]   Message     His work up for mild lower abd pain is without significant findings.  Trial of glycopyrrolate 1-2 mg po bid prn abd pain for possible bowel spasms.       I have left a message for the patient to call back to discuss

## 2012-04-21 NOTE — Telephone Encounter (Signed)
Patient called to ask what he should do next.   He stated that the procedure was ok, but that the CT found something.  He asked many questions that I could not answer.  He also got a little upset when I told him to make an appointment in the office to speak with Dr. Russella Dar.  I told him that I would send Dr.  Russella Dar a message regarding his issues.

## 2012-04-22 MED ORDER — GLYCOPYRROLATE 1 MG PO TABS: ORAL_TABLET | ORAL | Status: DC

## 2012-04-22 NOTE — Telephone Encounter (Signed)
Patient advised.  I have sent this to [facility] at the patient's request.  He will call back for any questions

## 2012-04-24 ENCOUNTER — Other Ambulatory Visit: Payer: Self-pay | Admitting: Internal Medicine

## 2012-05-01 ENCOUNTER — Ambulatory Visit (HOSPITAL_BASED_OUTPATIENT_CLINIC_OR_DEPARTMENT_OTHER): Payer: Medicare Other | Admitting: Hematology & Oncology

## 2012-05-01 ENCOUNTER — Other Ambulatory Visit (HOSPITAL_BASED_OUTPATIENT_CLINIC_OR_DEPARTMENT_OTHER): Payer: Medicare Other | Admitting: Lab

## 2012-05-01 ENCOUNTER — Ambulatory Visit: Payer: Medicare Other

## 2012-05-01 VITALS — BP 131/65 | HR 76 | Temp 96.7°F | Ht 69.0 in | Wt 159.0 lb

## 2012-05-01 DIAGNOSIS — D631 Anemia in chronic kidney disease: Secondary | ICD-10-CM

## 2012-05-01 DIAGNOSIS — D649 Anemia, unspecified: Secondary | ICD-10-CM

## 2012-05-01 DIAGNOSIS — D469 Myelodysplastic syndrome, unspecified: Secondary | ICD-10-CM

## 2012-05-01 DIAGNOSIS — N289 Disorder of kidney and ureter, unspecified: Secondary | ICD-10-CM

## 2012-05-01 DIAGNOSIS — D510 Vitamin B12 deficiency anemia due to intrinsic factor deficiency: Secondary | ICD-10-CM | POA: Diagnosis not present

## 2012-05-01 DIAGNOSIS — N189 Chronic kidney disease, unspecified: Secondary | ICD-10-CM

## 2012-05-01 LAB — CBC WITH DIFFERENTIAL (CANCER CENTER ONLY)
BASO%: 0.6 % (ref 0.0–2.0)
EOS%: 5.9 % (ref 0.0–7.0)
HCT: 32.1 % — ABNORMAL LOW (ref 38.7–49.9)
HGB: 10.9 g/dL — ABNORMAL LOW (ref 13.0–17.1)
LYMPH#: 1 10*3/uL (ref 0.9–3.3)
MCHC: 34 g/dL (ref 32.0–35.9)
MCV: 105 fL — ABNORMAL HIGH (ref 82–98)
MONO%: 6.8 % (ref 0.0–13.0)
NEUT#: 1.9 10*3/uL (ref 1.5–6.5)
Platelets: 275 10*3/uL (ref 145–400)
RBC: 3.06 10*6/uL — ABNORMAL LOW (ref 4.20–5.70)
RDW: 12.9 % (ref 11.1–15.7)
WBC: 3.2 10*3/uL — ABNORMAL LOW (ref 4.0–10.0)

## 2012-05-01 LAB — BASIC METABOLIC PANEL
Calcium: 8.9 mg/dL (ref 8.4–10.5)
Sodium: 144 mEq/L (ref 135–145)

## 2012-05-01 LAB — RETICULOCYTES (CHCC)
ABS Retic: 22.1 10*3/uL (ref 19.0–186.0)
Retic Ct Pct: 0.7 % (ref 0.4–2.3)

## 2012-05-01 NOTE — Progress Notes (Signed)
This office note has been dictated.

## 2012-05-02 ENCOUNTER — Ambulatory Visit (INDEPENDENT_AMBULATORY_CARE_PROVIDER_SITE_OTHER): Payer: Medicare Other | Admitting: *Deleted

## 2012-05-02 DIAGNOSIS — D518 Other vitamin B12 deficiency anemias: Secondary | ICD-10-CM

## 2012-05-02 DIAGNOSIS — E538 Deficiency of other specified B group vitamins: Secondary | ICD-10-CM | POA: Diagnosis not present

## 2012-05-02 NOTE — Progress Notes (Signed)
CC:   [name], MD Arturo Morton. [name], MD, [facility] Alvy Beal, MD  DIAGNOSES: 1. Anemia of renal insufficiency. 2. Pernicious anemia.  CURRENT THERAPY: 1. Aranesp 300 mcg subcu as needed for hemoglobin less than 10. 2. Vitamin B12 1 mg IM every month, given at home.  INTERIM HISTORY:  Ms. Packard come in for his first followup.  He is doing okay.  He is looking forward to summer and all the sports going on.  He likes to go to the [employer].  He is still bothered by his back.  This has been the worst problem that he deals with on a daily basis.  He last got Aranesp back in April.  When we last checked his iron studies in May, his ferritin was 650. Iron saturation was 79%.  He has had no bleeding.  He has had no problems with bowels or bladder. He has had no leg swelling.  There have been no rashes.  PHYSICAL EXAMINATION:  This is an elderly white gentleman in no obvious distress.  Vital signs:  Temperature of 96.7, pulse 76, respiratory rate 18, blood pressure 131/65.  Weight is 159.  Head and neck: Normocephalic, atraumatic skull.  There are no ocular or oral lesions. There are no palpable cervical or supraclavicular lymph nodes.  Lungs: Clear bilaterally.  Cardiac:  Regular rate and rhythm with a normal S1 and S2.  There are no murmurs or rubs, or bruits.  Abdomen:  Soft with good bowel sounds.  There is no palpable abdominal mass.  There is no fluid wave.  There is no palpable hepatosplenomegaly.  Back: No tenderness over the spine, ribs, or hips.  He has some slight kyphosis. Extremities:  No clubbing, cyanosis or edema.  Neurologic:  No focal neurological deficits.  LABORATORY STUDIES:  White cell count is 3.2, hemoglobin 10.9, hematocrit 32.1, platelet count 275.  MCV is 105.  IMPRESSION:  Mr. Salvetti is a 78 year old gentleman with anemia of renal insufficiency.  Again, he has responded very nicely to the Aranesp.  He does not need any today.  We  will go ahead and plan to get him back in another month for followup.    ______________________________ Josph Macho, M.D. PRE/MEDQ  D:  05/01/2012  T:  05/02/2012  Job:  [id number]

## 2012-05-06 DIAGNOSIS — B351 Tinea unguium: Secondary | ICD-10-CM | POA: Diagnosis not present

## 2012-05-06 DIAGNOSIS — M79609 Pain in unspecified limb: Secondary | ICD-10-CM | POA: Diagnosis not present

## 2012-05-29 ENCOUNTER — Ambulatory Visit (INDEPENDENT_AMBULATORY_CARE_PROVIDER_SITE_OTHER): Payer: Medicare Other | Admitting: Internal Medicine

## 2012-05-29 DIAGNOSIS — D518 Other vitamin B12 deficiency anemias: Secondary | ICD-10-CM | POA: Diagnosis not present

## 2012-05-30 ENCOUNTER — Ambulatory Visit: Payer: Medicare Other

## 2012-05-30 ENCOUNTER — Ambulatory Visit (HOSPITAL_BASED_OUTPATIENT_CLINIC_OR_DEPARTMENT_OTHER): Payer: Medicare Other | Admitting: Hematology & Oncology

## 2012-05-30 ENCOUNTER — Other Ambulatory Visit (HOSPITAL_BASED_OUTPATIENT_CLINIC_OR_DEPARTMENT_OTHER): Payer: Medicare Other | Admitting: Lab

## 2012-05-30 VITALS — BP 145/69 | HR 77 | Temp 96.9°F | Wt 167.0 lb

## 2012-05-30 DIAGNOSIS — D649 Anemia, unspecified: Secondary | ICD-10-CM

## 2012-05-30 DIAGNOSIS — M4120 Other idiopathic scoliosis, site unspecified: Secondary | ICD-10-CM | POA: Diagnosis not present

## 2012-05-30 DIAGNOSIS — D510 Vitamin B12 deficiency anemia due to intrinsic factor deficiency: Secondary | ICD-10-CM

## 2012-05-30 DIAGNOSIS — N289 Disorder of kidney and ureter, unspecified: Secondary | ICD-10-CM

## 2012-05-30 DIAGNOSIS — D631 Anemia in chronic kidney disease: Secondary | ICD-10-CM | POA: Diagnosis not present

## 2012-05-30 DIAGNOSIS — M48061 Spinal stenosis, lumbar region without neurogenic claudication: Secondary | ICD-10-CM | POA: Diagnosis not present

## 2012-05-30 DIAGNOSIS — N189 Chronic kidney disease, unspecified: Secondary | ICD-10-CM

## 2012-05-30 DIAGNOSIS — D469 Myelodysplastic syndrome, unspecified: Secondary | ICD-10-CM

## 2012-05-30 LAB — CBC WITH DIFFERENTIAL (CANCER CENTER ONLY)
BASO#: 0 10*3/uL (ref 0.0–0.2)
BASO%: 0.2 % (ref 0.0–2.0)
Eosinophils Absolute: 0.2 10*3/uL (ref 0.0–0.5)
HCT: 30.2 % — ABNORMAL LOW (ref 38.7–49.9)
HGB: 10.3 g/dL — ABNORMAL LOW (ref 13.0–17.1)
LYMPH#: 1.4 10*3/uL (ref 0.9–3.3)
MONO#: 0.3 10*3/uL (ref 0.1–0.9)
NEUT%: 51.7 % (ref 40.0–80.0)
RBC: 2.92 10*6/uL — ABNORMAL LOW (ref 4.20–5.70)
RDW: 12.6 % (ref 11.1–15.7)
WBC: 4.1 10*3/uL (ref 4.0–10.0)

## 2012-05-30 LAB — IRON AND TIBC
TIBC: 213 ug/dL — ABNORMAL LOW (ref 215–435)
UIBC: 28 ug/dL — ABNORMAL LOW (ref 125–400)

## 2012-05-30 LAB — RETICULOCYTES (CHCC): RBC.: 3.01 MIL/uL — ABNORMAL LOW (ref 4.22–5.81)

## 2012-05-30 NOTE — Progress Notes (Signed)
This office note has been dictated.

## 2012-05-30 NOTE — Progress Notes (Signed)
CC:   Alexander H. Swords, MD Alexander Rice. Alexander Kill, MD, [facility] Alexander Beal, MD  DIAGNOSES: 1. Anemia of renal insufficiency. 2. Pernicious anemia.  CURRENT THERAPY: 1. Aranesp 300 mcg subcu as needed for hemoglobin less than 10. 2. Vitamin B12 1 mg IM q. month, patient gives himself at home.  INTERIM HISTORY:  Alexander Rice comes in for followup.  He is looking real good.  He feels all right.  His back still bothers him quite a bit. He is seeing Dr. Shon Baton of Orthopedic surgery today.  His last Aranesp was given back in May.  His iron studies have always shown a high ferritin.  His ferritin was 508 back in June.  Iron saturation was 53%.  We have not had to transfuse Alexander Rice for quite a while.  He has had no bleeding.  There is no change in bowel or bladder habits. There has been no change in medications.  He has not noted any rashes. There have been no ecchymoses.  PHYSICAL EXAMINATION:  This is an elderly white gentleman in no obvious distress.  Vital signs:  906.9, pulse 77, respiratory rate 16, blood pressure 145/69.  Weight is 167.  Head and neck:  Normocephalic, atraumatic skull.  There are no ocular or oral lesions.  There are no palpable cervical or supraclavicular lymph nodes.  Lungs:  Clear bilaterally.  Cardiac:  Regular rate and rhythm with a normal S1 and S2. There are no murmurs, rubs or bruits.  Abdomen:  Soft with good bowel sounds.  There is no palpable abdominal mass.  There is no palpable hepatosplenomegaly.  Extremities:  No clubbing, cyanosis or edema.  He has some age-related osteoarthritic changes in his joints.  Skin:  No rashes, ecchymosis or petechia.  LABORATORY STUDIES:  White cell count 4.1, hemoglobin 10.3, hematocrit 30.2, platelet count 294.  IMPRESSION:  Alexander Rice is a 78 year old gentleman with multifactorial anemia.  He has both anemia of renal insufficiency.  He also has pernicious anemia.  We will go ahead and plan to get him back  in about 2 months' time.  I suspect that he may need a dose of Aranesp at that point.  We will go ahead and get him back in September.    ______________________________ Josph Macho, M.D. PRE/MEDQ  D:  05/30/2012  T:  05/30/2012  Job:  [id number]

## 2012-07-01 ENCOUNTER — Ambulatory Visit: Payer: Medicare Other | Admitting: Internal Medicine

## 2012-07-04 ENCOUNTER — Other Ambulatory Visit: Payer: Medicare Other | Admitting: Lab

## 2012-07-04 ENCOUNTER — Ambulatory Visit: Payer: Medicare Other | Admitting: Medical

## 2012-07-07 ENCOUNTER — Ambulatory Visit (HOSPITAL_BASED_OUTPATIENT_CLINIC_OR_DEPARTMENT_OTHER): Payer: Medicare Other | Admitting: Hematology & Oncology

## 2012-07-07 ENCOUNTER — Ambulatory Visit (HOSPITAL_BASED_OUTPATIENT_CLINIC_OR_DEPARTMENT_OTHER): Payer: Medicare Other

## 2012-07-07 ENCOUNTER — Other Ambulatory Visit (HOSPITAL_BASED_OUTPATIENT_CLINIC_OR_DEPARTMENT_OTHER): Payer: Medicare Other | Admitting: Lab

## 2012-07-07 ENCOUNTER — Ambulatory Visit: Payer: Medicare Other | Admitting: Medical

## 2012-07-07 VITALS — BP 152/51 | HR 82 | Temp 97.3°F | Resp 20 | Ht 69.0 in | Wt 174.0 lb

## 2012-07-07 DIAGNOSIS — D649 Anemia, unspecified: Secondary | ICD-10-CM

## 2012-07-07 DIAGNOSIS — D510 Vitamin B12 deficiency anemia due to intrinsic factor deficiency: Secondary | ICD-10-CM

## 2012-07-07 DIAGNOSIS — D469 Myelodysplastic syndrome, unspecified: Secondary | ICD-10-CM

## 2012-07-07 DIAGNOSIS — N289 Disorder of kidney and ureter, unspecified: Secondary | ICD-10-CM

## 2012-07-07 DIAGNOSIS — N189 Chronic kidney disease, unspecified: Secondary | ICD-10-CM

## 2012-07-07 DIAGNOSIS — D631 Anemia in chronic kidney disease: Secondary | ICD-10-CM

## 2012-07-07 LAB — CBC WITH DIFFERENTIAL (CANCER CENTER ONLY)
BASO#: 0 10*3/uL (ref 0.0–0.2)
BASO%: 0.3 % (ref 0.0–2.0)
EOS%: 4.2 % (ref 0.0–7.0)
HGB: 9 g/dL — ABNORMAL LOW (ref 13.0–17.1)
LYMPH#: 1 10*3/uL (ref 0.9–3.3)
MCH: 35.4 pg — ABNORMAL HIGH (ref 28.0–33.4)
MCHC: 33.8 g/dL (ref 32.0–35.9)
MONO%: 7.6 % (ref 0.0–13.0)
NEUT#: 2.1 10*3/uL (ref 1.5–6.5)
RDW: 13.6 % (ref 11.1–15.7)

## 2012-07-07 LAB — RETICULOCYTES (CHCC)
RBC.: 2.62 MIL/uL — ABNORMAL LOW (ref 4.22–5.81)
Retic Ct Pct: 1.2 % (ref 0.4–2.3)

## 2012-07-07 LAB — IRON AND TIBC: Iron: 214 ug/dL — ABNORMAL HIGH (ref 42–165)

## 2012-07-07 MED ORDER — DARBEPOETIN ALFA-POLYSORBATE 300 MCG/0.6ML IJ SOLN
300.0000 ug | Freq: Once | INTRAMUSCULAR | Status: AC
  Administered 2012-07-07: 300 ug via SUBCUTANEOUS

## 2012-07-07 NOTE — Progress Notes (Signed)
This office note has been dictated.

## 2012-07-08 NOTE — Progress Notes (Signed)
CC:   [name], MD Arturo Morton. [name], MD, [facility] Alvy Beal, MD  DIAGNOSES: 1. Anemia of renal insufficiency. 2. Pernicious anemia.  CURRENT THERAPY: 1. Aranesp 300 mcg subcu as needed for hemoglobin less than 10. 2. Vitamin B12 one milligram IM q. Month (patient gives at home).  INTERIM HISTORY:  Mr. Tith comes in for his followup.  He is doing okay.  His back continues to be his biggest problem.  This has limited him to what he can do.  When we last saw him, his ferritin was 706 with an iron saturation of 87%.  We have not had to transfuse him.  His last Aranesp was given back in May.  He has not had any bleeding.  There has been no change in bowel or bladder habits.  He has had no fever.  He has had no cough.  Appetite has been okay.  PHYSICAL EXAMINATION:  This is an elderly white gentleman in no obvious distress.  Vital signs:  Temperature of 97.3, pulse 82, respiratory rate 20, blood pressure 152/51.  Weight is 174.  Head and neck: Normocephalic, atraumatic skull.  There are no ocular or oral lesions. There are no palpable cervical or supraclavicular lymph nodes.  Lungs: Clear bilaterally.  Cardiac:  Regular rate and rhythm with a normal S1 and S2.  There are no murmurs, rubs or bruits.  Abdomen:  Soft with good bowel sounds.  There is no palpable abdominal mass.  There is no palpable hepatosplenomegaly.  Extremities:  Osteoarthritic changes in his joints.  Neurologic:  No focal neurological deficits.  LABORATORY STUDIES:  White cell count is 3.6, hemoglobin 9, hematocrit 26.6, platelet count 289.  Ferritin is 629.  IMPRESSION:  Mr. Ketchum is a 78 year old gentleman with anemia of renal insufficiency.  He has pernicious anemia.  We will go ahead and give him Aranesp today.  Again, his last Aranesp was back in May.  We will get him back in a month for followup.  We what his blood work looks at that point in time.  I will see him back myself in 2  months.    ______________________________ Josph Macho, M.D. PRE/MEDQ  D:  07/07/2012  T:  07/08/2012  Job:  [id number]

## 2012-07-10 ENCOUNTER — Other Ambulatory Visit: Payer: Self-pay | Admitting: *Deleted

## 2012-07-10 MED ORDER — ISOSORBIDE MONONITRATE ER 30 MG PO TB24: 30.0000 mg | ORAL_TABLET | Freq: Every day | ORAL | Status: DC

## 2012-07-29 DIAGNOSIS — B351 Tinea unguium: Secondary | ICD-10-CM | POA: Diagnosis not present

## 2012-07-29 DIAGNOSIS — M79609 Pain in unspecified limb: Secondary | ICD-10-CM | POA: Diagnosis not present

## 2012-08-01 ENCOUNTER — Telehealth: Payer: Self-pay | Admitting: Hematology & Oncology

## 2012-08-01 ENCOUNTER — Ambulatory Visit (INDEPENDENT_AMBULATORY_CARE_PROVIDER_SITE_OTHER): Payer: Medicare Other | Admitting: Internal Medicine

## 2012-08-01 DIAGNOSIS — E538 Deficiency of other specified B group vitamins: Secondary | ICD-10-CM

## 2012-08-01 MED ORDER — CYANOCOBALAMIN 1000 MCG/ML IJ SOLN
1000.0000 ug | INTRAMUSCULAR | Status: DC
  Administered 2012-08-01: 1000 ug via INTRAMUSCULAR

## 2012-08-01 NOTE — Telephone Encounter (Signed)
Pt moved 9-16 to 9-17

## 2012-08-04 ENCOUNTER — Ambulatory Visit: Payer: Medicare Other

## 2012-08-04 ENCOUNTER — Other Ambulatory Visit: Payer: Medicare Other | Admitting: Lab

## 2012-08-05 ENCOUNTER — Ambulatory Visit (HOSPITAL_BASED_OUTPATIENT_CLINIC_OR_DEPARTMENT_OTHER): Payer: Medicare Other

## 2012-08-05 ENCOUNTER — Other Ambulatory Visit (HOSPITAL_BASED_OUTPATIENT_CLINIC_OR_DEPARTMENT_OTHER): Payer: Medicare Other | Admitting: Lab

## 2012-08-05 VITALS — BP 131/62 | HR 72 | Temp 97.0°F | Resp 18

## 2012-08-05 DIAGNOSIS — N289 Disorder of kidney and ureter, unspecified: Secondary | ICD-10-CM

## 2012-08-05 DIAGNOSIS — N259 Disorder resulting from impaired renal tubular function, unspecified: Secondary | ICD-10-CM

## 2012-08-05 DIAGNOSIS — D649 Anemia, unspecified: Secondary | ICD-10-CM | POA: Diagnosis not present

## 2012-08-05 DIAGNOSIS — N189 Chronic kidney disease, unspecified: Secondary | ICD-10-CM

## 2012-08-05 DIAGNOSIS — D469 Myelodysplastic syndrome, unspecified: Secondary | ICD-10-CM

## 2012-08-05 LAB — CBC WITH DIFFERENTIAL (CANCER CENTER ONLY)
BASO#: 0 10*3/uL (ref 0.0–0.2)
Eosinophils Absolute: 0.1 10*3/uL (ref 0.0–0.5)
HGB: 9.6 g/dL — ABNORMAL LOW (ref 13.0–17.1)
MCV: 106 fL — ABNORMAL HIGH (ref 82–98)
MONO#: 0.2 10*3/uL (ref 0.1–0.9)
NEUT#: 1.5 10*3/uL (ref 1.5–6.5)
Platelets: 298 10*3/uL (ref 145–400)
RBC: 2.72 10*6/uL — ABNORMAL LOW (ref 4.20–5.70)
WBC: 2.9 10*3/uL — ABNORMAL LOW (ref 4.0–10.0)

## 2012-08-05 MED ORDER — DARBEPOETIN ALFA-POLYSORBATE 300 MCG/0.6ML IJ SOLN
300.0000 ug | INTRAMUSCULAR | Status: DC
  Administered 2012-08-05: 300 ug via SUBCUTANEOUS

## 2012-08-05 NOTE — Patient Instructions (Signed)
Darbepoetin Alfa injection What is this medicine? DARBEPOETIN ALFA (dar be POE e tin AL fa) helps your body make more red blood cells. It is used to treat anemia caused by chronic kidney failure and chemotherapy. This medicine may be used for other purposes; ask your health care provider or pharmacist if you have questions. What should I tell my health care provider before I take this medicine? They need to know if you have any of these conditions: -blood clotting disorders or history of blood clots -cancer patient not on chemotherapy -cystic fibrosis -heart disease, such as angina, heart failure, or a history of a heart attack -hemoglobin level of 12 g/dL or greater -high blood pressure -low levels of folate, iron, or vitamin B12 -seizures -an unusual or allergic reaction to darbepoetin, erythropoietin, albumin, hamster proteins, latex, other medicines, foods, dyes, or preservatives -pregnant or trying to get pregnant -breast-feeding How should I use this medicine? This medicine is for injection into a vein or under the skin. It is usually given by a health care professional in a hospital or clinic setting. If you get this medicine at home, you will be taught how to prepare and give this medicine. Do not shake the solution before you withdraw a dose. Use exactly as directed. Take your medicine at regular intervals. Do not take your medicine more often than directed. It is important that you put your used needles and syringes in a special sharps container. Do not put them in a trash can. If you do not have a sharps container, call your pharmacist or healthcare provider to get one. Talk to your pediatrician regarding the use of this medicine in children. While this medicine may be used in children as young as 1 year for selected conditions, precautions do apply. Overdosage: If you think you have taken too much of this medicine contact a poison control center or emergency room at once. NOTE:  This medicine is only for you. Do not share this medicine with others. What if I miss a dose? If you miss a dose, take it as soon as you can. If it is almost time for your next dose, take only that dose. Do not take double or extra doses. What may interact with this medicine? Do not take this medicine with any of the following medications: -epoetin alfa This list may not describe all possible interactions. Give your health care provider a list of all the medicines, herbs, non-prescription drugs, or dietary supplements you use. Also tell them if you smoke, drink alcohol, or use illegal drugs. Some items may interact with your medicine. What should I watch for while using this medicine? Visit your prescriber or health care professional for regular checks on your progress and for the needed blood tests and blood pressure measurements. It is especially important for the doctor to make sure your hemoglobin level is in the desired range, to limit the risk of potential side effects and to give you the best benefit. Keep all appointments for any recommended tests. Check your blood pressure as directed. Ask your doctor what your blood pressure should be and when you should contact him or her. As your body makes more red blood cells, you may need to take iron, folic acid, or vitamin B supplements. Ask your doctor or health care provider which products are right for you. If you have kidney disease continue dietary restrictions, even though this medication can make you feel better. Talk with your doctor or health care professional about the   foods you eat and the vitamins that you take. What side effects may I notice from receiving this medicine? Side effects that you should report to your doctor or health care professional as soon as possible: -allergic reactions like skin rash, itching or hives, swelling of the face, lips, or tongue -breathing problems -changes in vision -chest pain -confusion, trouble speaking  or understanding -feeling faint or lightheaded, falls -high blood pressure -muscle aches or pains -pain, swelling, warmth in the leg -rapid weight gain -severe headaches -sudden numbness or weakness of the face, arm or leg -trouble walking, dizziness, loss of balance or coordination -seizures (convulsions) -swelling of the ankles, feet, hands -unusually weak or tired Side effects that usually do not require medical attention (report to your doctor or health care professional if they continue or are bothersome): -diarrhea -fever, chills (flu-like symptoms) -headaches -nausea, vomiting -redness, stinging, or swelling at site where injected This list may not describe all possible side effects. Call your doctor for medical advice about side effects. You may report side effects to FDA at 1-800-FDA-1088. Where should I keep my medicine? Keep out of the reach of children. Store in a refrigerator between 2 and 8 degrees C (36 and 46 degrees F). Do not freeze. Do not shake. Throw away any unused portion if using a single-dose vial. Throw away any unused medicine after the expiration date. NOTE: This sheet is a summary. It may not cover all possible information. If you have questions about this medicine, talk to your doctor, pharmacist, or health care provider.  2012, Elsevier/[employer]. (10/19/2008 10:23:57 AM) 

## 2012-08-19 DIAGNOSIS — H538 Other visual disturbances: Secondary | ICD-10-CM | POA: Diagnosis not present

## 2012-08-19 DIAGNOSIS — H524 Presbyopia: Secondary | ICD-10-CM | POA: Diagnosis not present

## 2012-08-19 DIAGNOSIS — H5200 Hypermetropia, unspecified eye: Secondary | ICD-10-CM | POA: Diagnosis not present

## 2012-09-01 ENCOUNTER — Ambulatory Visit (HOSPITAL_BASED_OUTPATIENT_CLINIC_OR_DEPARTMENT_OTHER): Payer: Medicare Other | Admitting: Medical

## 2012-09-01 ENCOUNTER — Ambulatory Visit: Payer: Medicare Other

## 2012-09-01 ENCOUNTER — Ambulatory Visit (HOSPITAL_BASED_OUTPATIENT_CLINIC_OR_DEPARTMENT_OTHER): Payer: Medicare Other | Admitting: Lab

## 2012-09-01 DIAGNOSIS — D469 Myelodysplastic syndrome, unspecified: Secondary | ICD-10-CM | POA: Diagnosis not present

## 2012-09-01 DIAGNOSIS — D649 Anemia, unspecified: Secondary | ICD-10-CM

## 2012-09-01 DIAGNOSIS — D510 Vitamin B12 deficiency anemia due to intrinsic factor deficiency: Secondary | ICD-10-CM

## 2012-09-01 DIAGNOSIS — D631 Anemia in chronic kidney disease: Secondary | ICD-10-CM

## 2012-09-01 DIAGNOSIS — N289 Disorder of kidney and ureter, unspecified: Secondary | ICD-10-CM | POA: Diagnosis not present

## 2012-09-01 LAB — FERRITIN: Ferritin: 586 ng/mL — ABNORMAL HIGH (ref 22–322)

## 2012-09-01 LAB — CBC WITH DIFFERENTIAL (CANCER CENTER ONLY)
BASO#: 0 10*3/uL (ref 0.0–0.2)
Eosinophils Absolute: 0.1 10*3/uL (ref 0.0–0.5)
HGB: 10.6 g/dL — ABNORMAL LOW (ref 13.0–17.1)
MCH: 36.2 pg — ABNORMAL HIGH (ref 28.0–33.4)
MONO#: 0.3 10*3/uL (ref 0.1–0.9)
MONO%: 8.1 % (ref 0.0–13.0)
NEUT#: 2.2 10*3/uL (ref 1.5–6.5)
RBC: 2.93 10*6/uL — ABNORMAL LOW (ref 4.20–5.70)
WBC: 3.8 10*3/uL — ABNORMAL LOW (ref 4.0–10.0)

## 2012-09-01 LAB — RETICULOCYTES (CHCC): Retic Ct Pct: 0.6 % (ref 0.4–2.3)

## 2012-09-01 LAB — IRON AND TIBC
%SAT: 88 % — ABNORMAL HIGH (ref 20–55)
Iron: 217 ug/dL — ABNORMAL HIGH (ref 42–165)
TIBC: 248 ug/dL (ref 215–435)
UIBC: 31 ug/dL — ABNORMAL LOW (ref 125–400)

## 2012-09-01 NOTE — Progress Notes (Signed)
Diagnoses: #1 anemia of renal insufficiency. #2, pernicious anemia.  Current therapy: #1 Aranesp 300 mcg subcutaneous as needed.  For hemoglobin less than 10. #2, vitamin B12 1 mg IM q. monthly (he gets this a dr. Cato Mulligan office)  Interim history: Alexander Rice presents today for an office followup visit.  Overall, he continues to do quite well.  He still reports, his biggest problem is his back.  This does limit him to what he can do.  His last Aranesp was back in September 2013.  His hemoglobin is over 10 today.  As, such, she will not need an Aranesp injection.  His last ferritin back in August 2013 was 629, with an iron level of 214% saturation was not calculated.  He does not report any significant decrease in energy.  He does not report any significant fatigue.  He has a good appetite.  He denies any nausea, vomiting, diarrhea, constipation, any chest pain, shortness of breath, cough, fevers, chills, or night sweats.  He denies any obvious, or abnormal bleeding or bruising.  He denies any odynophagia or dysphasia.  He denies any headaches, visual changes, or any type of rashes.  He denies any lower leg swelling.   Review of Systems: Pt. Denies any changes in their vision, hearing, adenopathy, fevers, chills, nausea, vomiting, diarrhea, constipation, chest pain, shortness of breath, passing blood, passing out, blacking out,  any changes in skin, joints, neurologic or psychiatric except as noted.  Physical Exam: This is an elderly, 78 year old, white gentleman, in no obvious distress Vitals: Temperature 97.4 degrees.  Pulse 65, respirations 18, blood pressure 144/50 weight 168 pounds HEENT reveals a normocephalic, atraumatic skull, no scleral icterus, no oral lesions  Neck is supple without any cervical or supraclavicular adenopathy.  Lungs are clear to auscultation bilaterally. There are no wheezes, rales or rhonci Cardiac is regular rate and rhythm with a normal S1 and S2. There are no  murmurs, rubs, or bruits.  Abdomen is soft with good bowel sounds, there is no palpable mass. There is no palpable hepatosplenomegaly. There is no palpable fluid wave.  Musculoskeletal no tenderness of the spine, ribs, or hips.  Extremities there are no clubbing, cyanosis, or edema.  Skin no petechia, purpura or ecchymosis Neurologic is nonfocal.  Laboratory Data: White count 3.8, hemoglobin 10.6, hematocrit 31.2, platelets 302,000  Current Outpatient Prescriptions on File Prior to Visit  Medication Sig Dispense Refill  . aspirin 81 MG tablet Take 81 mg by mouth daily.        . dorzolamide (TRUSOPT) 2 % ophthalmic solution Place 1 drop into both eyes 2 (two) times daily.        [name] doxazosin (CARDURA) 8 MG tablet TAKE 1 TABLET AT BEDTIME  90 tablet  2  . glycopyrrolate (ROBINUL) 1 MG tablet Take 1-2 po BID as needed for abdominal pain  180 tablet  1  . isosorbide mononitrate (IMDUR) 30 MG 24 hr tablet Take 1 tablet (30 mg total) by mouth daily.  90 tablet  1  . LUMIGAN 0.01 % SOLN Place 1 drop into both eyes daily.      . Multiple Vitamin (MULTIVITAMIN) tablet Take 1 tablet by mouth daily.        . nitroGLYCERIN (NITROSTAT) 0.4 MG SL tablet Place 0.4 mg under the tongue every 5 (five) minutes as needed.        . NON FORMULARY every 30 (thirty) days. Cyanocobalamin injection        . Omega-3 Fatty Acids (FISH  OIL) 1000 MG CAPS Take 1 capsule by mouth daily.        [name] omeprazole (PRILOSEC) 20 MG capsule Take 1 capsule (20 mg total) by mouth daily.  90 capsule  3  . oxybutynin (DITROPAN) 5 MG tablet Take 5 mg by mouth 2 (two) times daily.       . potassium chloride SA (K-DUR,KLOR-CON) 20 MEQ tablet daily. Pt not sure if he takes it      . simvastatin (ZOCOR) 40 MG tablet Take 1 tablet (40 mg total) by mouth at bedtime.  90 tablet  3   Current Facility-Administered Medications on File Prior to Visit  Medication Dose Route Frequency Provider Last Rate Last Dose  . darbepoetin (ARANESP)  injection 300 mcg  300 mcg Subcutaneous Once Josph Macho, MD       Assessment/Plan:  This is a pleasant, 78 year old, white gentleman, with the following issues:  #1 anemia of renal insufficiency.  His hemoglobin is slightly over 10.  As such, we he will not need an Aranesp injection.  #2, pernicious anemia.  He gets a B12 injection every month at Dr. Cato Mulligan office.  3 followup.  I would like to have Alexander Rice come back in a month for CBC and possible injection, and an office followup visit in 2 months.  We will see him back at that time, but before then should there be questions or concerns.

## 2012-09-02 ENCOUNTER — Ambulatory Visit (INDEPENDENT_AMBULATORY_CARE_PROVIDER_SITE_OTHER): Payer: Medicare Other | Admitting: Internal Medicine

## 2012-09-02 DIAGNOSIS — E538 Deficiency of other specified B group vitamins: Secondary | ICD-10-CM

## 2012-09-02 MED ORDER — CYANOCOBALAMIN 1000 MCG/ML IJ SOLN
1000.0000 ug | Freq: Once | INTRAMUSCULAR | Status: AC
  Administered 2012-09-02: 1000 ug via INTRAMUSCULAR

## 2012-09-08 ENCOUNTER — Telehealth: Payer: Self-pay | Admitting: Internal Medicine

## 2012-09-08 MED ORDER — SIMVASTATIN 40 MG PO TABS: 40.0000 mg | ORAL_TABLET | Freq: Every day | ORAL | Status: DC

## 2012-09-08 NOTE — Telephone Encounter (Signed)
Pt requesting 90 day rx refill of simvastatin tab 40mg  sent to [facility].

## 2012-09-08 NOTE — Telephone Encounter (Signed)
Refill sent in to [facility]. Pt is due for an OV.

## 2012-09-26 ENCOUNTER — Ambulatory Visit (HOSPITAL_BASED_OUTPATIENT_CLINIC_OR_DEPARTMENT_OTHER): Payer: Medicare Other

## 2012-09-26 ENCOUNTER — Other Ambulatory Visit (HOSPITAL_BASED_OUTPATIENT_CLINIC_OR_DEPARTMENT_OTHER): Payer: Medicare Other | Admitting: Lab

## 2012-09-26 VITALS — BP 146/70 | HR 53 | Temp 96.3°F | Resp 18

## 2012-09-26 DIAGNOSIS — D649 Anemia, unspecified: Secondary | ICD-10-CM | POA: Diagnosis not present

## 2012-09-26 DIAGNOSIS — N189 Chronic kidney disease, unspecified: Secondary | ICD-10-CM

## 2012-09-26 DIAGNOSIS — N289 Disorder of kidney and ureter, unspecified: Secondary | ICD-10-CM

## 2012-09-26 DIAGNOSIS — N259 Disorder resulting from impaired renal tubular function, unspecified: Secondary | ICD-10-CM

## 2012-09-26 DIAGNOSIS — D469 Myelodysplastic syndrome, unspecified: Secondary | ICD-10-CM

## 2012-09-26 DIAGNOSIS — D631 Anemia in chronic kidney disease: Secondary | ICD-10-CM

## 2012-09-26 LAB — CBC WITH DIFFERENTIAL (CANCER CENTER ONLY)
BASO#: 0 10*3/uL (ref 0.0–0.2)
EOS%: 4.4 % (ref 0.0–7.0)
Eosinophils Absolute: 0.1 10*3/uL (ref 0.0–0.5)
HCT: 29 % — ABNORMAL LOW (ref 38.7–49.9)
HGB: 9.9 g/dL — ABNORMAL LOW (ref 13.0–17.1)
LYMPH#: 1.1 10*3/uL (ref 0.9–3.3)
MCHC: 34.1 g/dL (ref 32.0–35.9)
MONO#: 0.3 10*3/uL (ref 0.1–0.9)
NEUT#: 1.5 10*3/uL (ref 1.5–6.5)
NEUT%: 49.2 % (ref 40.0–80.0)
RBC: 2.78 10*6/uL — ABNORMAL LOW (ref 4.20–5.70)
WBC: 3 10*3/uL — ABNORMAL LOW (ref 4.0–10.0)

## 2012-09-26 MED ORDER — DARBEPOETIN ALFA-POLYSORBATE 300 MCG/0.6ML IJ SOLN
300.0000 ug | INTRAMUSCULAR | Status: DC
  Administered 2012-09-26: 300 ug via SUBCUTANEOUS

## 2012-09-26 NOTE — Patient Instructions (Signed)
Darbepoetin Alfa injection What is this medicine? DARBEPOETIN ALFA (dar be POE e tin AL fa) helps your body make more red blood cells. It is used to treat anemia caused by chronic kidney failure and chemotherapy. This medicine may be used for other purposes; ask your health care provider or pharmacist if you have questions. What should I tell my health care provider before I take this medicine? They need to know if you have any of these conditions: -blood clotting disorders or history of blood clots -cancer patient not on chemotherapy -cystic fibrosis -heart disease, such as angina, heart failure, or a history of a heart attack -hemoglobin level of 12 g/dL or greater -high blood pressure -low levels of folate, iron, or vitamin B12 -seizures -an unusual or allergic reaction to darbepoetin, erythropoietin, albumin, hamster proteins, latex, other medicines, foods, dyes, or preservatives -pregnant or trying to get pregnant -breast-feeding How should I use this medicine? This medicine is for injection into a vein or under the skin. It is usually given by a health care professional in a hospital or clinic setting. If you get this medicine at home, you will be taught how to prepare and give this medicine. Do not shake the solution before you withdraw a dose. Use exactly as directed. Take your medicine at regular intervals. Do not take your medicine more often than directed. It is important that you put your used needles and syringes in a special sharps container. Do not put them in a trash can. If you do not have a sharps container, call your pharmacist or healthcare provider to get one. Talk to your pediatrician regarding the use of this medicine in children. While this medicine may be used in children as young as 1 year for selected conditions, precautions do apply. Overdosage: If you think you have taken too much of this medicine contact a poison control center or emergency room at once. NOTE:  This medicine is only for you. Do not share this medicine with others. What if I miss a dose? If you miss a dose, take it as soon as you can. If it is almost time for your next dose, take only that dose. Do not take double or extra doses. What may interact with this medicine? Do not take this medicine with any of the following medications: -epoetin alfa This list may not describe all possible interactions. Give your health care provider a list of all the medicines, herbs, non-prescription drugs, or dietary supplements you use. Also tell them if you smoke, drink alcohol, or use illegal drugs. Some items may interact with your medicine. What should I watch for while using this medicine? Visit your prescriber or health care professional for regular checks on your progress and for the needed blood tests and blood pressure measurements. It is especially important for the doctor to make sure your hemoglobin level is in the desired range, to limit the risk of potential side effects and to give you the best benefit. Keep all appointments for any recommended tests. Check your blood pressure as directed. Ask your doctor what your blood pressure should be and when you should contact him or her. As your body makes more red blood cells, you may need to take iron, folic acid, or vitamin B supplements. Ask your doctor or health care provider which products are right for you. If you have kidney disease continue dietary restrictions, even though this medication can make you feel better. Talk with your doctor or health care professional about the   foods you eat and the vitamins that you take. What side effects may I notice from receiving this medicine? Side effects that you should report to your doctor or health care professional as soon as possible: -allergic reactions like skin rash, itching or hives, swelling of the face, lips, or tongue -breathing problems -changes in vision -chest pain -confusion, trouble speaking  or understanding -feeling faint or lightheaded, falls -high blood pressure -muscle aches or pains -pain, swelling, warmth in the leg -rapid weight gain -severe headaches -sudden numbness or weakness of the face, arm or leg -trouble walking, dizziness, loss of balance or coordination -seizures (convulsions) -swelling of the ankles, feet, hands -unusually weak or tired Side effects that usually do not require medical attention (report to your doctor or health care professional if they continue or are bothersome): -diarrhea -fever, chills (flu-like symptoms) -headaches -nausea, vomiting -redness, stinging, or swelling at site where injected This list may not describe all possible side effects. Call your doctor for medical advice about side effects. You may report side effects to FDA at 1-800-FDA-1088. Where should I keep my medicine? Keep out of the reach of children. Store in a refrigerator between 2 and 8 degrees C (36 and 46 degrees F). Do not freeze. Do not shake. Throw away any unused portion if using a single-dose vial. Throw away any unused medicine after the expiration date. NOTE: This sheet is a summary. It may not cover all possible information. If you have questions about this medicine, talk to your doctor, pharmacist, or health care provider.  2012, Elsevier/[employer]. (10/19/2008 10:23:57 AM) 

## 2012-10-03 ENCOUNTER — Ambulatory Visit (INDEPENDENT_AMBULATORY_CARE_PROVIDER_SITE_OTHER): Payer: Medicare Other | Admitting: Internal Medicine

## 2012-10-03 DIAGNOSIS — E538 Deficiency of other specified B group vitamins: Secondary | ICD-10-CM | POA: Diagnosis not present

## 2012-10-03 MED ORDER — CYANOCOBALAMIN 1000 MCG/ML IJ SOLN
1000.0000 ug | Freq: Once | INTRAMUSCULAR | Status: AC
  Administered 2012-10-03: 1000 ug via INTRAMUSCULAR

## 2012-10-06 DIAGNOSIS — H4011X0 Primary open-angle glaucoma, stage unspecified: Secondary | ICD-10-CM | POA: Diagnosis not present

## 2012-10-06 DIAGNOSIS — H409 Unspecified glaucoma: Secondary | ICD-10-CM | POA: Diagnosis not present

## 2012-10-06 DIAGNOSIS — H04129 Dry eye syndrome of unspecified lacrimal gland: Secondary | ICD-10-CM | POA: Diagnosis not present

## 2012-10-15 ENCOUNTER — Telehealth: Payer: Self-pay | Admitting: Internal Medicine

## 2012-10-15 ENCOUNTER — Other Ambulatory Visit: Payer: Self-pay | Admitting: Internal Medicine

## 2012-10-15 MED ORDER — OMEPRAZOLE 20 MG PO CPDR: 20.0000 mg | DELAYED_RELEASE_CAPSULE | Freq: Every day | ORAL | Status: DC

## 2012-10-15 NOTE — Telephone Encounter (Signed)
rx sent in electronically 

## 2012-10-15 NOTE — Telephone Encounter (Signed)
Pt following up on refill for PRILOSEC at [facility].  He has been out for several days and wanting to know if this had been ordered. His no [phone]

## 2012-10-15 NOTE — Telephone Encounter (Signed)
Pt called req refill for omeprazole (PRILOSEC) 20 MG capsule to [facility].

## 2012-10-15 NOTE — Telephone Encounter (Signed)
This is a duplicate.

## 2012-10-21 DIAGNOSIS — B351 Tinea unguium: Secondary | ICD-10-CM | POA: Diagnosis not present

## 2012-10-21 DIAGNOSIS — M79609 Pain in unspecified limb: Secondary | ICD-10-CM | POA: Diagnosis not present

## 2012-10-27 ENCOUNTER — Telehealth: Payer: Self-pay | Admitting: Internal Medicine

## 2012-10-27 MED ORDER — DOXAZOSIN MESYLATE 8 MG PO TABS: 8.0000 mg | ORAL_TABLET | Freq: Every day | ORAL | Status: DC

## 2012-10-27 NOTE — Telephone Encounter (Signed)
Pt needs OV 

## 2012-10-27 NOTE — Telephone Encounter (Signed)
rx sent in electronically 

## 2012-10-27 NOTE — Telephone Encounter (Signed)
Patient called stating that he need a refill of his doxazosin (CARDURA) 8 MG tablet take 1poqd sent to [facility]. Please assist.

## 2012-10-27 NOTE — Telephone Encounter (Signed)
Patient has an appt scheduled for 11/11/12

## 2012-10-30 ENCOUNTER — Ambulatory Visit (INDEPENDENT_AMBULATORY_CARE_PROVIDER_SITE_OTHER): Payer: Medicare Other | Admitting: *Deleted

## 2012-10-30 DIAGNOSIS — D518 Other vitamin B12 deficiency anemias: Secondary | ICD-10-CM

## 2012-10-30 MED ORDER — CYANOCOBALAMIN 1000 MCG/ML IJ SOLN
1000.0000 ug | Freq: Once | INTRAMUSCULAR | Status: AC
  Administered 2012-10-30: 1000 ug via INTRAMUSCULAR

## 2012-10-30 MED ORDER — CYANOCOBALAMIN 1000 MCG/ML IJ SOLN: 1000.0000 ug | Freq: Once | INTRAMUSCULAR | Status: DC

## 2012-10-31 ENCOUNTER — Other Ambulatory Visit (HOSPITAL_BASED_OUTPATIENT_CLINIC_OR_DEPARTMENT_OTHER): Payer: Medicare Other | Admitting: Lab

## 2012-10-31 ENCOUNTER — Ambulatory Visit: Payer: Medicare Other

## 2012-10-31 ENCOUNTER — Ambulatory Visit (HOSPITAL_BASED_OUTPATIENT_CLINIC_OR_DEPARTMENT_OTHER): Payer: Medicare Other | Admitting: Hematology & Oncology

## 2012-10-31 VITALS — BP 155/53 | HR 63 | Temp 97.5°F | Resp 18 | Ht 69.0 in | Wt 168.0 lb

## 2012-10-31 DIAGNOSIS — D469 Myelodysplastic syndrome, unspecified: Secondary | ICD-10-CM

## 2012-10-31 DIAGNOSIS — D631 Anemia in chronic kidney disease: Secondary | ICD-10-CM | POA: Diagnosis not present

## 2012-10-31 DIAGNOSIS — D649 Anemia, unspecified: Secondary | ICD-10-CM

## 2012-10-31 DIAGNOSIS — N189 Chronic kidney disease, unspecified: Secondary | ICD-10-CM

## 2012-10-31 DIAGNOSIS — N289 Disorder of kidney and ureter, unspecified: Secondary | ICD-10-CM | POA: Diagnosis not present

## 2012-10-31 LAB — FERRITIN: Ferritin: 586 ng/mL — ABNORMAL HIGH (ref 22–322)

## 2012-10-31 LAB — CBC WITH DIFFERENTIAL (CANCER CENTER ONLY)
Eosinophils Absolute: 0.2 10*3/uL (ref 0.0–0.5)
MCV: 107 fL — ABNORMAL HIGH (ref 82–98)
MONO#: 0.3 10*3/uL (ref 0.1–0.9)
NEUT#: 2.6 10*3/uL (ref 1.5–6.5)
Platelets: 328 10*3/uL (ref 145–400)
RBC: 2.7 10*6/uL — ABNORMAL LOW (ref 4.20–5.70)
WBC: 4.3 10*3/uL (ref 4.0–10.0)

## 2012-10-31 LAB — IRON AND TIBC
%SAT: 61 % — ABNORMAL HIGH (ref 20–55)
Iron: 137 ug/dL (ref 42–165)
TIBC: 225 ug/dL (ref 215–435)

## 2012-10-31 NOTE — Progress Notes (Signed)
Patient seen by Dr. Myna Hidalgo, no Aranesp indicated per Dr. Myna Hidalgo. [name], Rayder Sullenger [employer]

## 2012-10-31 NOTE — Progress Notes (Signed)
This office note has been dictated.

## 2012-11-01 NOTE — Progress Notes (Signed)
CC:   [name], MD  DIAGNOSIS: 1. Anemia of renal insufficiency. 2. Refractory anemia. 3. Pernicious anemia.  CURRENT THERAPY: 1. Aranesp 300 mcg subcutaneous as needed for hemoglobin less than 10. 2. Vitamin B12 1 mg IM monthly.  INTERIM HISTORY:  Alexander Rice comes in for followup.  He is doing quite well.  He feels well.  He has had back issues, which he has always had. He has not had Aranesp since September. He is doing Agricultural consultant work.  He is active in the [address]. He is a big [employer].  He is getting ready for the bowl game against the [employer]. He does have a high ferritin level.  This has always been elevated.  I think a lot of this is because is bad arthritis.  Last ferritin back in October was 586.  PHYSICAL EXAM:  This is an elderly white gentleman with kyphosis.  Vital signs:  Temperature of 97.5, pulse 63, respiratory rate 18, blood pressure 155/53.  Weight is 168.  Head and neck:  Normocephalic, atraumatic skull.  There are no ocular or oral lesions.  There are no palpable cervical or supraclavicular lymph nodes.  Lungs:  Clear bilaterally.  Cardiac:  Regular rate and rhythm with a normal S1 and S2. There are no murmurs, rubs or bruits.  Abdomen:  Soft with good bowel sounds.  There is no palpable abdominal mass.  There is no fluid wave. There is no palpable hepatosplenomegaly.  Back:  Shows kyphosis.  He has some tenderness in the lower spine to palpation.  Extremities:  Show some osteoarthritic changes.  No edema is noted in his legs.  LABORATORY STUDIES:  White cell count is 4.3, hemoglobin 9.5, hematocrit 29, platelet count 378.  MCV is 107.  IMPRESSION:  Alexander Rice is a 78 year old gentleman with multifactorial anemia.  He does not want Aranesp today.  I have no problems with this. He is pretty much asymptomatic.  His anemia is accommodated quite nicely.  We will still get him back in 1 month.  I suspect that in 1 month he probably  will need a dose of Aranesp. The fact that he has been 3 or 4 months without Aranesp is certainly encouraging.    ______________________________ Josph Macho, M.D. PRE/MEDQ  D:  10/31/2012  T:  11/01/2012  Job:  [id number]

## 2012-11-11 ENCOUNTER — Ambulatory Visit (INDEPENDENT_AMBULATORY_CARE_PROVIDER_SITE_OTHER): Payer: Medicare Other | Admitting: Internal Medicine

## 2012-11-11 ENCOUNTER — Encounter: Payer: Self-pay | Admitting: Internal Medicine

## 2012-11-11 VITALS — BP 146/84 | HR 76 | Temp 98.1°F | Wt 173.0 lb

## 2012-11-11 DIAGNOSIS — I2510 Atherosclerotic heart disease of native coronary artery without angina pectoris: Secondary | ICD-10-CM | POA: Diagnosis not present

## 2012-11-11 DIAGNOSIS — D469 Myelodysplastic syndrome, unspecified: Secondary | ICD-10-CM

## 2012-11-11 DIAGNOSIS — I10 Essential (primary) hypertension: Secondary | ICD-10-CM

## 2012-11-11 DIAGNOSIS — E785 Hyperlipidemia, unspecified: Secondary | ICD-10-CM | POA: Diagnosis not present

## 2012-11-11 DIAGNOSIS — N259 Disorder resulting from impaired renal tubular function, unspecified: Secondary | ICD-10-CM | POA: Diagnosis not present

## 2012-11-11 LAB — HEPATIC FUNCTION PANEL
ALT: 14 U/L (ref 0–53)
AST: 15 U/L (ref 0–37)
Albumin: 3.9 g/dL (ref 3.5–5.2)

## 2012-11-11 LAB — BASIC METABOLIC PANEL
BUN: 21 mg/dL (ref 6–23)
Chloride: 107 mEq/L (ref 96–112)
Glucose, Bld: 86 mg/dL (ref 70–99)
Potassium: 4.7 mEq/L (ref 3.5–5.1)

## 2012-11-11 LAB — LIPID PANEL
Cholesterol: 127 mg/dL (ref 0–200)
LDL Cholesterol: 35 mg/dL (ref 0–99)

## 2012-11-11 NOTE — Progress Notes (Signed)
Patient ID: Alexander Rice, male   DOB: July 16, 1932, 78 y.o.   MRN: [id number]  MDS- followed by dr. Myna Hidalgo.  Lab Results  Component Value Date   WBC 4.3 10/31/2012   HGB 9.5* 10/31/2012   HCT 28.8* 10/31/2012   MCV 107* 10/31/2012   PLT 328 10/31/2012   CAD- no sxs  htn- tolerating meds, no home bps  GERD- tolerating prilosec  Urinary frequency-- follwed by urology  Lipids- tolerating simvastatin  Reviewed pmh, psh, soc hx,    patient denies chest pain, shortness of breath, orthopnea. Denies lower extremity edema, abdominal pain, change in appetite, change in bowel movements. Patient denies rashes, musculoskeletal complaints. No other specific complaints in a complete review of systems.     well-developed well-nourished male in no acute distress. HEENT exam atraumatic, normocephalic, neck supple without jugular venous distention. Chest clear to auscultation cardiac exam S1-S2 are regular. Abdominal exam overweight with bowel sounds, soft and nontender. Extremities 1+ edema. Neurologic exam is alert with a normal gait.

## 2012-11-13 NOTE — Assessment & Plan Note (Signed)
Followed by hematology 

## 2012-11-13 NOTE — Assessment & Plan Note (Signed)
Previously well controlled. Will check laboratory work. Goal LDL less than 100.

## 2012-11-13 NOTE — Assessment & Plan Note (Signed)
Patient has no chest pain or other symptoms. Will continue risk factor modification.

## 2012-11-19 HISTORY — PX: GLAUCOMA SURGERY: SHX656

## 2012-11-21 DIAGNOSIS — R350 Frequency of micturition: Secondary | ICD-10-CM | POA: Diagnosis not present

## 2012-11-21 DIAGNOSIS — R351 Nocturia: Secondary | ICD-10-CM | POA: Diagnosis not present

## 2012-11-28 ENCOUNTER — Other Ambulatory Visit (HOSPITAL_BASED_OUTPATIENT_CLINIC_OR_DEPARTMENT_OTHER): Payer: Medicare Other | Admitting: Lab

## 2012-11-28 ENCOUNTER — Ambulatory Visit (HOSPITAL_BASED_OUTPATIENT_CLINIC_OR_DEPARTMENT_OTHER): Payer: Medicare Other | Admitting: Medical

## 2012-11-28 ENCOUNTER — Ambulatory Visit (HOSPITAL_BASED_OUTPATIENT_CLINIC_OR_DEPARTMENT_OTHER): Payer: Medicare Other

## 2012-11-28 VITALS — BP 132/52 | HR 72 | Temp 97.2°F | Resp 18 | Ht 69.0 in | Wt 173.0 lb

## 2012-11-28 DIAGNOSIS — N289 Disorder of kidney and ureter, unspecified: Secondary | ICD-10-CM

## 2012-11-28 DIAGNOSIS — D631 Anemia in chronic kidney disease: Secondary | ICD-10-CM

## 2012-11-28 DIAGNOSIS — D469 Myelodysplastic syndrome, unspecified: Secondary | ICD-10-CM

## 2012-11-28 DIAGNOSIS — D649 Anemia, unspecified: Secondary | ICD-10-CM

## 2012-11-28 DIAGNOSIS — N259 Disorder resulting from impaired renal tubular function, unspecified: Secondary | ICD-10-CM

## 2012-11-28 DIAGNOSIS — D510 Vitamin B12 deficiency anemia due to intrinsic factor deficiency: Secondary | ICD-10-CM

## 2012-11-28 LAB — CBC WITH DIFFERENTIAL (CANCER CENTER ONLY)
Eosinophils Absolute: 0.1 10*3/uL (ref 0.0–0.5)
HCT: 27.8 % — ABNORMAL LOW (ref 38.7–49.9)
LYMPH#: 1.1 10*3/uL (ref 0.9–3.3)
LYMPH%: 33.7 % (ref 14.0–48.0)
MCV: 107 fL — ABNORMAL HIGH (ref 82–98)
MONO#: 0.3 10*3/uL (ref 0.1–0.9)
Platelets: 295 10*3/uL (ref 145–400)
RBC: 2.6 10*6/uL — ABNORMAL LOW (ref 4.20–5.70)
WBC: 3.1 10*3/uL — ABNORMAL LOW (ref 4.0–10.0)

## 2012-11-28 LAB — FERRITIN: Ferritin: 611 ng/mL — ABNORMAL HIGH (ref 22–322)

## 2012-11-28 LAB — IRON AND TIBC
%SAT: 90 % — ABNORMAL HIGH (ref 20–55)
TIBC: 248 ug/dL (ref 215–435)
UIBC: 25 ug/dL — ABNORMAL LOW (ref 125–400)

## 2012-11-28 LAB — RETICULOCYTES (CHCC): Retic Ct Pct: 0.8 % (ref 0.4–2.3)

## 2012-11-28 LAB — CHCC SATELLITE - SMEAR

## 2012-11-28 MED ORDER — DARBEPOETIN ALFA-POLYSORBATE 300 MCG/0.6ML IJ SOLN
300.0000 ug | INTRAMUSCULAR | Status: DC
  Administered 2012-11-28: 300 ug via SUBCUTANEOUS

## 2012-11-28 NOTE — Patient Instructions (Signed)
Darbepoetin Alfa injection What is this medicine? DARBEPOETIN ALFA (dar be POE e tin AL fa) helps your body make more red blood cells. It is used to treat anemia caused by chronic kidney failure and chemotherapy. This medicine may be used for other purposes; ask your health care provider or pharmacist if you have questions. What should I tell my health care provider before I take this medicine? They need to know if you have any of these conditions: -blood clotting disorders or history of blood clots -cancer patient not on chemotherapy -cystic fibrosis -heart disease, such as angina, heart failure, or a history of a heart attack -hemoglobin level of 12 g/dL or greater -high blood pressure -low levels of folate, iron, or vitamin B12 -seizures -an unusual or allergic reaction to darbepoetin, erythropoietin, albumin, hamster proteins, latex, other medicines, foods, dyes, or preservatives -pregnant or trying to get pregnant -breast-feeding How should I use this medicine? This medicine is for injection into a vein or under the skin. It is usually given by a health care professional in a hospital or clinic setting. If you get this medicine at home, you will be taught how to prepare and give this medicine. Do not shake the solution before you withdraw a dose. Use exactly as directed. Take your medicine at regular intervals. Do not take your medicine more often than directed. It is important that you put your used needles and syringes in a special sharps container. Do not put them in a trash can. If you do not have a sharps container, call your pharmacist or healthcare provider to get one. Talk to your pediatrician regarding the use of this medicine in children. While this medicine may be used in children as young as 1 year for selected conditions, precautions do apply. Overdosage: If you think you have taken too much of this medicine contact a poison control center or emergency room at once. NOTE:  This medicine is only for you. Do not share this medicine with others. What if I miss a dose? If you miss a dose, take it as soon as you can. If it is almost time for your next dose, take only that dose. Do not take double or extra doses. What may interact with this medicine? Do not take this medicine with any of the following medications: -epoetin alfa This list may not describe all possible interactions. Give your health care provider a list of all the medicines, herbs, non-prescription drugs, or dietary supplements you use. Also tell them if you smoke, drink alcohol, or use illegal drugs. Some items may interact with your medicine. What should I watch for while using this medicine? Visit your prescriber or health care professional for regular checks on your progress and for the needed blood tests and blood pressure measurements. It is especially important for the doctor to make sure your hemoglobin level is in the desired range, to limit the risk of potential side effects and to give you the best benefit. Keep all appointments for any recommended tests. Check your blood pressure as directed. Ask your doctor what your blood pressure should be and when you should contact him or her. As your body makes more red blood cells, you may need to take iron, folic acid, or vitamin B supplements. Ask your doctor or health care provider which products are right for you. If you have kidney disease continue dietary restrictions, even though this medication can make you feel better. Talk with your doctor or health care professional about the   foods you eat and the vitamins that you take. What side effects may I notice from receiving this medicine? Side effects that you should report to your doctor or health care professional as soon as possible: -allergic reactions like skin rash, itching or hives, swelling of the face, lips, or tongue -breathing problems -changes in vision -chest pain -confusion, trouble speaking  or understanding -feeling faint or lightheaded, falls -high blood pressure -muscle aches or pains -pain, swelling, warmth in the leg -rapid weight gain -severe headaches -sudden numbness or weakness of the face, arm or leg -trouble walking, dizziness, loss of balance or coordination -seizures (convulsions) -swelling of the ankles, feet, hands -unusually weak or tired Side effects that usually do not require medical attention (report to your doctor or health care professional if they continue or are bothersome): -diarrhea -fever, chills (flu-like symptoms) -headaches -nausea, vomiting -redness, stinging, or swelling at site where injected This list may not describe all possible side effects. Call your doctor for medical advice about side effects. You may report side effects to FDA at 1-800-FDA-1088. Where should I keep my medicine? Keep out of the reach of children. Store in a refrigerator between 2 and 8 degrees C (36 and 46 degrees F). Do not freeze. Do not shake. Throw away any unused portion if using a single-dose vial. Throw away any unused medicine after the expiration date. NOTE: This sheet is a summary. It may not cover all possible information. If you have questions about this medicine, talk to your doctor, pharmacist, or health care provider.  2012, Elsevier/[employer]. (10/19/2008 10:23:57 AM) 

## 2012-11-28 NOTE — Progress Notes (Signed)
Diagnoses: #1.  Anemia of renal insufficiency. #2.  Refractory anemia. #3.  Pernicious anemia.  Current therapy: #1.  Aranesp.  300 mcg subcutaneous as needed.  For hemoglobin less than 10. #2.  Vitamin B12 1 mg IM monthly. (This is given at Dr. Cato Mulligan office)  Interim history: [name] presents today for an office followup visit.  He does continue to have some chronic issues with his back.  He continues to remain active.  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  He does not report any new problems or medications.  He continues to receive monthly B12 injections at Dr. Cato Mulligan office.  He does receive Aranesp injections at our office.  His hemoglobin is 9.2, as such, she will receive an Aranesp injection.  He, reports, that he has a good appetite.  He denies any nausea, vomiting, diarrhea, constipation, any cough, chest pain, shortness of breath, fevers, chills, night sweats.  He denies any obvious, or abnormal bleeding.  He denies any abdominal pain.  He denies any headaches, visual changes, or rashes.  He does have some lower leg swelling.  He does not report any excessive fatigue or weakness.  Overall, he is able to perform his activities of daily living without any hindrance or decline.  Review of Systems: Constitutional:Negative for malaise/fatigue, fever, chills, weight loss, diaphoresis, activity change, appetite change, and unexpected weight change.  HEENT: Negative for double vision, blurred vision, visual loss, ear pain, tinnitus, congestion, rhinorrhea, epistaxis sore throat or sinus disease, oral pain/lesion, tongue soreness Respiratory: Negative for cough, chest tightness, shortness of breath, wheezing and stridor.  Cardiovascular: Negative for chest pain, palpitations, leg swelling, orthopnea, PND, DOE or claudication Gastrointestinal: Negative for nausea, vomiting, abdominal pain, diarrhea, constipation, blood in stool, melena, hematochezia,  abdominal distention, anal bleeding, rectal pain, anorexia and hematemesis.  Genitourinary: Negative for dysuria, frequency, hematuria,  Musculoskeletal: Negative for myalgias, back pain, joint swelling, arthralgias and gait problem.  Skin: Negative for rash, color change, pallor and wound.  Neurological:. Negative for dizziness/light-headedness, tremors, seizures, syncope, facial asymmetry, speech difficulty, weakness, numbness, headaches and paresthesias.  Hematological: Negative for adenopathy. Does not bruise/bleed easily.  Psychiatric/Behavioral:  Negative for depression, no loss of interest in normal activity or change in sleep pattern.   Physical Exam: This is a pleasant, 79 year old, elderly, white gentleman, in no obvious distress Vitals: Temperature 97.2 degrees, pulse 72, respirations 18, blood pressure 132/52.  Weight 173 pounds HEENT reveals a normocephalic, atraumatic skull, no scleral icterus, no oral lesions  Neck is supple without any cervical or supraclavicular adenopathy.  Lungs are clear to auscultation bilaterally. There are no wheezes, rales or rhonci Cardiac is regular rate and rhythm with a normal S1 and S2. There are no murmurs, rubs, or bruits.  Abdomen is soft with good bowel sounds, there is no palpable mass. There is no palpable hepatosplenomegaly. There is no palpable fluid wave.  Musculoskeletal no tenderness of the spine, ribs, or hips.  Extremities there are no clubbing, cyanosis, or edema.  Skin no petechia, purpura or ecchymosis Neurologic is nonfocal.  Laboratory Data: White count 3.1, hemoglobin 9.2, hematocrit 27.8, platelets 295,000  Current Outpatient Prescriptions on File Prior to Visit  Medication Sig Dispense Refill  . aspirin 81 MG tablet Take 81 mg by mouth daily.        . Cyanocobalamin (VITAMIN B 12 PO) Inject as directed every 30 (thirty) days.      . dorzolamide (TRUSOPT) 2 % ophthalmic solution  Place 1 drop into both eyes 2 (two) times  daily.        [name] doxazosin (CARDURA) 8 MG tablet Take 1 tablet (8 mg total) by mouth at bedtime.  90 tablet  0  . isosorbide mononitrate (IMDUR) 30 MG 24 hr tablet Take 1 tablet (30 mg total) by mouth daily.  90 tablet  1  . LUMIGAN 0.01 % SOLN Place 1 drop into both eyes daily.      . Multiple Vitamin (MULTIVITAMIN) tablet Take 1 tablet by mouth daily.        . nitroGLYCERIN (NITROSTAT) 0.4 MG SL tablet Place 0.4 mg under the tongue every 5 (five) minutes as needed.        [name] omeprazole (PRILOSEC) 20 MG capsule Take 1 capsule (20 mg total) by mouth daily.  90 capsule  3  . oxybutynin (DITROPAN) 5 MG tablet Take 5 mg by mouth 2 (two) times daily. Patient states he takes once daily.      . simvastatin (ZOCOR) 40 MG tablet Take 1 tablet (40 mg total) by mouth at bedtime.  90 tablet  0   Current Facility-Administered Medications on File Prior to Visit  Medication Dose Route Frequency Provider Last Rate Last Dose  . darbepoetin (ARANESP) injection 300 mcg  300 mcg Subcutaneous Once Josph Macho, MD       Assessment/Plan: This is a pleasant, 79 year old, white, with the following issues:  #1.  Anemia of renal insufficiency.  His hemoglobin is below 10 today, as, such, we'll go ahead and give him an Aranesp injection.  #2.  Pernicious anemia.  He does get B12 injections every month at Dr. Cato Mulligan office.  #3.  Followup.  [name] will return in one month for lab and injection.  He will return in 2 months for an office followup visit.

## 2012-12-04 ENCOUNTER — Ambulatory Visit (INDEPENDENT_AMBULATORY_CARE_PROVIDER_SITE_OTHER): Payer: Medicare Other | Admitting: Internal Medicine

## 2012-12-04 DIAGNOSIS — E538 Deficiency of other specified B group vitamins: Secondary | ICD-10-CM | POA: Diagnosis not present

## 2012-12-04 MED ORDER — CYANOCOBALAMIN 1000 MCG/ML IJ SOLN
1000.0000 ug | Freq: Once | INTRAMUSCULAR | Status: AC
  Administered 2012-12-04: 1000 ug via INTRAMUSCULAR

## 2012-12-08 ENCOUNTER — Other Ambulatory Visit: Payer: Self-pay | Admitting: *Deleted

## 2012-12-08 MED ORDER — POTASSIUM CHLORIDE CRYS ER 20 MEQ PO TBCR: 20.0000 meq | EXTENDED_RELEASE_TABLET | Freq: Every day | ORAL | Status: DC

## 2012-12-10 ENCOUNTER — Other Ambulatory Visit: Payer: Self-pay | Admitting: *Deleted

## 2012-12-10 MED ORDER — SIMVASTATIN 40 MG PO TABS: 40.0000 mg | ORAL_TABLET | Freq: Every day | ORAL | Status: DC

## 2012-12-25 ENCOUNTER — Ambulatory Visit: Payer: Medicare Other

## 2012-12-25 ENCOUNTER — Other Ambulatory Visit (HOSPITAL_BASED_OUTPATIENT_CLINIC_OR_DEPARTMENT_OTHER): Payer: Medicare Other | Admitting: Lab

## 2012-12-25 DIAGNOSIS — N259 Disorder resulting from impaired renal tubular function, unspecified: Secondary | ICD-10-CM

## 2012-12-25 DIAGNOSIS — D649 Anemia, unspecified: Secondary | ICD-10-CM | POA: Diagnosis not present

## 2012-12-25 DIAGNOSIS — D631 Anemia in chronic kidney disease: Secondary | ICD-10-CM

## 2012-12-25 DIAGNOSIS — D469 Myelodysplastic syndrome, unspecified: Secondary | ICD-10-CM

## 2012-12-25 LAB — CBC WITH DIFFERENTIAL (CANCER CENTER ONLY)
BASO%: 0.2 % (ref 0.0–2.0)
Eosinophils Absolute: 0.1 10*3/uL (ref 0.0–0.5)
LYMPH%: 31.4 % (ref 14.0–48.0)
MCH: 35.8 pg — ABNORMAL HIGH (ref 28.0–33.4)
MCHC: 33.7 g/dL (ref 32.0–35.9)
MCV: 107 fL — ABNORMAL HIGH (ref 82–98)
MONO%: 8.1 % (ref 0.0–13.0)
Platelets: 301 10*3/uL (ref 145–400)
RDW: 13.9 % (ref 11.1–15.7)

## 2012-12-25 MED ORDER — DARBEPOETIN ALFA-POLYSORBATE 300 MCG/0.6ML IJ SOLN: 300.0000 ug | INTRAMUSCULAR | Status: DC

## 2012-12-25 NOTE — Progress Notes (Signed)
Patient comes in today, CBC checked, Aranesp injection not given due to parameters.  Hgb was 10.0 .  Patient made aware.

## 2012-12-26 ENCOUNTER — Other Ambulatory Visit: Payer: Medicare Other | Admitting: Lab

## 2012-12-26 ENCOUNTER — Ambulatory Visit: Payer: Medicare Other | Admitting: Hematology & Oncology

## 2012-12-26 ENCOUNTER — Ambulatory Visit: Payer: Medicare Other

## 2013-01-05 DIAGNOSIS — H02409 Unspecified ptosis of unspecified eyelid: Secondary | ICD-10-CM | POA: Diagnosis not present

## 2013-01-05 DIAGNOSIS — H409 Unspecified glaucoma: Secondary | ICD-10-CM | POA: Diagnosis not present

## 2013-01-05 DIAGNOSIS — H04129 Dry eye syndrome of unspecified lacrimal gland: Secondary | ICD-10-CM | POA: Diagnosis not present

## 2013-01-05 DIAGNOSIS — H4011X0 Primary open-angle glaucoma, stage unspecified: Secondary | ICD-10-CM | POA: Diagnosis not present

## 2013-01-13 DIAGNOSIS — B351 Tinea unguium: Secondary | ICD-10-CM | POA: Diagnosis not present

## 2013-01-13 DIAGNOSIS — E1149 Type 2 diabetes mellitus with other diabetic neurological complication: Secondary | ICD-10-CM | POA: Diagnosis not present

## 2013-01-13 DIAGNOSIS — M79609 Pain in unspecified limb: Secondary | ICD-10-CM | POA: Diagnosis not present

## 2013-01-21 ENCOUNTER — Other Ambulatory Visit: Payer: Self-pay | Admitting: *Deleted

## 2013-01-21 ENCOUNTER — Telehealth: Payer: Self-pay | Admitting: Hematology & Oncology

## 2013-01-21 MED ORDER — DOXAZOSIN MESYLATE 8 MG PO TABS: 8.0000 mg | ORAL_TABLET | Freq: Every day | ORAL | Status: DC

## 2013-01-21 NOTE — Telephone Encounter (Signed)
Patient called and cx 01/23/13 and resch for 02/05/13

## 2013-01-23 ENCOUNTER — Ambulatory Visit: Payer: Medicare Other

## 2013-01-23 ENCOUNTER — Ambulatory Visit: Payer: Medicare Other | Admitting: Medical

## 2013-01-23 ENCOUNTER — Other Ambulatory Visit: Payer: Medicare Other | Admitting: Lab

## 2013-02-05 ENCOUNTER — Ambulatory Visit (HOSPITAL_BASED_OUTPATIENT_CLINIC_OR_DEPARTMENT_OTHER): Payer: Medicare Other

## 2013-02-05 ENCOUNTER — Ambulatory Visit (HOSPITAL_BASED_OUTPATIENT_CLINIC_OR_DEPARTMENT_OTHER): Payer: Medicare Other | Admitting: Medical

## 2013-02-05 ENCOUNTER — Other Ambulatory Visit (HOSPITAL_BASED_OUTPATIENT_CLINIC_OR_DEPARTMENT_OTHER): Payer: Medicare Other | Admitting: Lab

## 2013-02-05 VITALS — BP 135/50 | HR 65 | Temp 97.8°F | Resp 18 | Ht 69.0 in | Wt 177.0 lb

## 2013-02-05 DIAGNOSIS — N289 Disorder of kidney and ureter, unspecified: Secondary | ICD-10-CM | POA: Diagnosis not present

## 2013-02-05 DIAGNOSIS — N189 Chronic kidney disease, unspecified: Secondary | ICD-10-CM

## 2013-02-05 DIAGNOSIS — D469 Myelodysplastic syndrome, unspecified: Secondary | ICD-10-CM

## 2013-02-05 DIAGNOSIS — D510 Vitamin B12 deficiency anemia due to intrinsic factor deficiency: Secondary | ICD-10-CM

## 2013-02-05 DIAGNOSIS — N039 Chronic nephritic syndrome with unspecified morphologic changes: Secondary | ICD-10-CM | POA: Diagnosis not present

## 2013-02-05 DIAGNOSIS — D649 Anemia, unspecified: Secondary | ICD-10-CM

## 2013-02-05 DIAGNOSIS — D631 Anemia in chronic kidney disease: Secondary | ICD-10-CM

## 2013-02-05 DIAGNOSIS — N259 Disorder resulting from impaired renal tubular function, unspecified: Secondary | ICD-10-CM

## 2013-02-05 LAB — CBC WITH DIFFERENTIAL (CANCER CENTER ONLY)
BASO%: 0.2 % (ref 0.0–2.0)
LYMPH%: 25.7 % (ref 14.0–48.0)
MCV: 105 fL — ABNORMAL HIGH (ref 82–98)
MONO#: 0.4 10*3/uL (ref 0.1–0.9)
Platelets: 337 10*3/uL (ref 145–400)
RDW: 13.2 % (ref 11.1–15.7)
WBC: 4.3 10*3/uL (ref 4.0–10.0)

## 2013-02-05 LAB — IRON AND TIBC: TIBC: 220 ug/dL (ref 215–435)

## 2013-02-05 LAB — FERRITIN: Ferritin: 593 ng/mL — ABNORMAL HIGH (ref 22–322)

## 2013-02-05 MED ORDER — DARBEPOETIN ALFA-POLYSORBATE 300 MCG/0.6ML IJ SOLN
300.0000 ug | INTRAMUSCULAR | Status: DC
  Administered 2013-02-05: 300 ug via SUBCUTANEOUS

## 2013-02-05 NOTE — Patient Instructions (Signed)
Darbepoetin Alfa injection What is this medicine? DARBEPOETIN ALFA (dar be POE e tin AL fa) helps your body make more red blood cells. It is used to treat anemia caused by chronic kidney failure and chemotherapy. This medicine may be used for other purposes; ask your health care provider or pharmacist if you have questions. What should I tell my health care provider before I take this medicine? They need to know if you have any of these conditions: -blood clotting disorders or history of blood clots -cancer patient not on chemotherapy -cystic fibrosis -heart disease, such as angina, heart failure, or a history of a heart attack -hemoglobin level of 12 g/dL or greater -high blood pressure -low levels of folate, iron, or vitamin B12 -seizures -an unusual or allergic reaction to darbepoetin, erythropoietin, albumin, hamster proteins, latex, other medicines, foods, dyes, or preservatives -pregnant or trying to get pregnant -breast-feeding How should I use this medicine? This medicine is for injection into a vein or under the skin. It is usually given by a health care professional in a hospital or clinic setting. If you get this medicine at home, you will be taught how to prepare and give this medicine. Do not shake the solution before you withdraw a dose. Use exactly as directed. Take your medicine at regular intervals. Do not take your medicine more often than directed. It is important that you put your used needles and syringes in a special sharps container. Do not put them in a trash can. If you do not have a sharps container, call your pharmacist or healthcare provider to get one. Talk to your pediatrician regarding the use of this medicine in children. While this medicine may be used in children as young as 1 year for selected conditions, precautions do apply. Overdosage: If you think you have taken too much of this medicine contact a poison control center or emergency room at once. NOTE:  This medicine is only for you. Do not share this medicine with others. What if I miss a dose? If you miss a dose, take it as soon as you can. If it is almost time for your next dose, take only that dose. Do not take double or extra doses. What may interact with this medicine? Do not take this medicine with any of the following medications: -epoetin alfa This list may not describe all possible interactions. Give your health care provider a list of all the medicines, herbs, non-prescription drugs, or dietary supplements you use. Also tell them if you smoke, drink alcohol, or use illegal drugs. Some items may interact with your medicine. What should I watch for while using this medicine? Visit your prescriber or health care professional for regular checks on your progress and for the needed blood tests and blood pressure measurements. It is especially important for the doctor to make sure your hemoglobin level is in the desired range, to limit the risk of potential side effects and to give you the best benefit. Keep all appointments for any recommended tests. Check your blood pressure as directed. Ask your doctor what your blood pressure should be and when you should contact him or her. As your body makes more red blood cells, you may need to take iron, folic acid, or vitamin B supplements. Ask your doctor or health care provider which products are right for you. If you have kidney disease continue dietary restrictions, even though this medication can make you feel better. Talk with your doctor or health care professional about the   foods you eat and the vitamins that you take. What side effects may I notice from receiving this medicine? Side effects that you should report to your doctor or health care professional as soon as possible: -allergic reactions like skin rash, itching or hives, swelling of the face, lips, or tongue -breathing problems -changes in vision -chest pain -confusion, trouble speaking  or understanding -feeling faint or lightheaded, falls -high blood pressure -muscle aches or pains -pain, swelling, warmth in the leg -rapid weight gain -severe headaches -sudden numbness or weakness of the face, arm or leg -trouble walking, dizziness, loss of balance or coordination -seizures (convulsions) -swelling of the ankles, feet, hands -unusually weak or tired Side effects that usually do not require medical attention (report to your doctor or health care professional if they continue or are bothersome): -diarrhea -fever, chills (flu-like symptoms) -headaches -nausea, vomiting -redness, stinging, or swelling at site where injected This list may not describe all possible side effects. Call your doctor for medical advice about side effects. You may report side effects to FDA at 1-800-FDA-1088. Where should I keep my medicine? Keep out of the reach of children. Store in a refrigerator between 2 and 8 degrees C (36 and 46 degrees F). Do not freeze. Do not shake. Throw away any unused portion if using a single-dose vial. Throw away any unused medicine after the expiration date. NOTE: This sheet is a summary. It may not cover all possible information. If you have questions about this medicine, talk to your doctor, pharmacist, or health care provider.  2013, [employer]. (10/19/2008 10:23:57 AM)  

## 2013-02-05 NOTE — Progress Notes (Signed)
Diagnoses: #1.  Anemia of renal insufficiency. #2.  Refractory anemia. #3.  Pernicious anemia.  Current therapy: #1.  Aranesp.  300 mcg subcutaneous as needed.  For hemoglobin less than 10. #2.  Vitamin B12 1 mg IM monthly. (This is given at Dr. Cato Mulligan office)  Interim history: Alexander Rice presents today for an office followup visit.  He does continue to have some chronic issues with his back.  He continues to remain active.  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  He does not report any new problems or medications.  He continues to receive monthly B12 injections at Dr. Cato Mulligan office.  He does receive Aranesp injections at our office.  His hemoglobin is on the lower side today at 8.3.  He did not receive an Aranesp injection last month. He will receive an Aranesp injection today.  He, reports, that he has a good appetite.  He denies any nausea, vomiting, diarrhea, constipation, any cough, chest pain, shortness of breath, fevers, chills, night sweats.  He denies any obvious, or abnormal bleeding.  He denies any abdominal pain.  He denies any headaches, visual changes, or rashes.  He does have some lower leg swelling.  He does not report any excessive fatigue or weakness.  Overall, he is able to perform his activities of daily living without any hindrance or decline.  Review of Systems: Constitutional:Negative for malaise/fatigue, fever, chills, weight loss, diaphoresis, activity change, appetite change, and unexpected weight change.  HEENT: Negative for double vision, blurred vision, visual loss, ear pain, tinnitus, congestion, rhinorrhea, epistaxis sore throat or sinus disease, oral pain/lesion, tongue soreness Respiratory: Negative for cough, chest tightness, shortness of breath, wheezing and stridor.  Cardiovascular: Negative for chest pain, palpitations, leg swelling, orthopnea, PND, DOE or claudication Gastrointestinal: Negative for nausea, vomiting, abdominal  pain, diarrhea, constipation, blood in stool, melena, hematochezia, abdominal distention, anal bleeding, rectal pain, anorexia and hematemesis.  Genitourinary: Negative for dysuria, frequency, hematuria,  Musculoskeletal: Negative for myalgias, back pain, joint swelling, arthralgias and gait problem.  Skin: Negative for rash, color change, pallor and wound.  Neurological:. Negative for dizziness/light-headedness, tremors, seizures, syncope, facial asymmetry, speech difficulty, weakness, numbness, headaches and paresthesias.  Hematological: Negative for adenopathy. Does not bruise/bleed easily.  Psychiatric/Behavioral:  Negative for depression, no loss of interest in normal activity or change in sleep pattern.   Physical Exam: This is a pleasant, 79 year old, elderly, white gentleman, in no obvious distress Vitals: Temperature 97.8 degrees, pulse 65, respirations 18, blood pressure 135 or 50 weight 177 pounds HEENT reveals a normocephalic, atraumatic skull, no scleral icterus, no oral lesions  Neck is supple without any cervical or supraclavicular adenopathy.  Lungs are clear to auscultation bilaterally. There are no wheezes, rales or rhonci Cardiac is regular rate and rhythm with a normal S1 and S2. There are no murmurs, rubs, or bruits.  Abdomen is soft with good bowel sounds, there is no palpable mass. There is no palpable hepatosplenomegaly. There is no palpable fluid wave.  Musculoskeletal no tenderness of the spine, ribs, or hips.  Extremities there are no clubbing, cyanosis, or edema.  Skin no petechia, purpura or ecchymosis Neurologic is nonfocal.  Laboratory Data: White count 4.3, hemoglobin 8.3, hematocrit 25.0, platelets 337,000  Current Outpatient Prescriptions on File Prior to Visit  Medication Sig Dispense Refill  . aspirin 81 MG tablet Take 81 mg by mouth daily.        . Cyanocobalamin (VITAMIN B 12 PO) Inject as directed  every 30 (thirty) days.      . dorzolamide (TRUSOPT) 2  % ophthalmic solution Place 1 drop into both eyes 2 (two) times daily.        [name] doxazosin (CARDURA) 8 MG tablet Take 1 tablet (8 mg total) by mouth at bedtime.  90 tablet  0  . isosorbide mononitrate (IMDUR) 30 MG 24 hr tablet Take 1 tablet (30 mg total) by mouth daily.  90 tablet  1  . LUMIGAN 0.01 % SOLN Place 1 drop into both eyes daily.      . Multiple Vitamin (MULTIVITAMIN) tablet Take 1 tablet by mouth daily.        . nitroGLYCERIN (NITROSTAT) 0.4 MG SL tablet Place 0.4 mg under the tongue every 5 (five) minutes as needed.        [name] omeprazole (PRILOSEC) 20 MG capsule Take 1 capsule (20 mg total) by mouth daily.  90 capsule  3  . oxybutynin (DITROPAN) 5 MG tablet Take 5 mg by mouth 2 (two) times daily. Patient states he takes once daily.      . simvastatin (ZOCOR) 40 MG tablet Take 1 tablet (40 mg total) by mouth at bedtime.  90 tablet  0   Current Facility-Administered Medications on File Prior to Visit  Medication Dose Route Frequency Provider Last Rate Last Dose  . darbepoetin (ARANESP) injection 300 mcg  300 mcg Subcutaneous Once Josph Macho, MD       Assessment/Plan: This is a pleasant, 79 year old, white, with the following issues:  #1.  Anemia of renal insufficiency.  His hemoglobin is below 10 today, as, such, we'll go ahead and give him an Aranesp injection.  We will continue to monitor his CBC monthly.  #2.  Pernicious anemia.  He does get B12 injections every month at Dr. Cato Mulligan office.  #3.  Followup.  Alexander Rice will return in one month for lab and injection.  He will return in 2 months for an office followup visit.

## 2013-02-13 ENCOUNTER — Ambulatory Visit (INDEPENDENT_AMBULATORY_CARE_PROVIDER_SITE_OTHER): Payer: Medicare Other

## 2013-02-13 DIAGNOSIS — D518 Other vitamin B12 deficiency anemias: Secondary | ICD-10-CM

## 2013-02-13 MED ORDER — CYANOCOBALAMIN 1000 MCG/ML IJ SOLN
1000.0000 ug | Freq: Once | INTRAMUSCULAR | Status: AC
  Administered 2013-02-13: 1000 ug via INTRAMUSCULAR

## 2013-02-24 ENCOUNTER — Encounter: Payer: Self-pay | Admitting: Gastroenterology

## 2013-03-05 ENCOUNTER — Other Ambulatory Visit (HOSPITAL_BASED_OUTPATIENT_CLINIC_OR_DEPARTMENT_OTHER): Payer: Medicare Other | Admitting: Lab

## 2013-03-05 ENCOUNTER — Ambulatory Visit (HOSPITAL_BASED_OUTPATIENT_CLINIC_OR_DEPARTMENT_OTHER): Payer: Medicare Other

## 2013-03-05 VITALS — BP 151/58 | HR 66 | Temp 94.3°F | Resp 16

## 2013-03-05 DIAGNOSIS — D649 Anemia, unspecified: Secondary | ICD-10-CM

## 2013-03-05 DIAGNOSIS — D469 Myelodysplastic syndrome, unspecified: Secondary | ICD-10-CM

## 2013-03-05 DIAGNOSIS — D631 Anemia in chronic kidney disease: Secondary | ICD-10-CM

## 2013-03-05 DIAGNOSIS — N289 Disorder of kidney and ureter, unspecified: Secondary | ICD-10-CM

## 2013-03-05 DIAGNOSIS — N259 Disorder resulting from impaired renal tubular function, unspecified: Secondary | ICD-10-CM

## 2013-03-05 LAB — CBC WITH DIFFERENTIAL (CANCER CENTER ONLY)
BASO#: 0 10*3/uL (ref 0.0–0.2)
EOS%: 3.8 % (ref 0.0–7.0)
Eosinophils Absolute: 0.2 10*3/uL (ref 0.0–0.5)
LYMPH%: 24.2 % (ref 14.0–48.0)
MCH: 35.4 pg — ABNORMAL HIGH (ref 28.0–33.4)
MCHC: 33 g/dL (ref 32.0–35.9)
MCV: 107 fL — ABNORMAL HIGH (ref 82–98)
MONO%: 10.1 % (ref 0.0–13.0)
NEUT#: 2.4 10*3/uL (ref 1.5–6.5)
Platelets: 375 10*3/uL (ref 145–400)

## 2013-03-05 MED ORDER — DARBEPOETIN ALFA-POLYSORBATE 300 MCG/0.6ML IJ SOLN
300.0000 ug | INTRAMUSCULAR | Status: DC
  Administered 2013-03-05: 300 ug via SUBCUTANEOUS

## 2013-03-19 ENCOUNTER — Telehealth: Payer: Self-pay | Admitting: *Deleted

## 2013-03-19 DIAGNOSIS — R911 Solitary pulmonary nodule: Secondary | ICD-10-CM

## 2013-03-19 NOTE — Telephone Encounter (Signed)
Message copied by Christen Butter on Thu Mar 19, 2013  4:43 PM ------      Message from: Nyoka Cowden      Created: Tue Apr 08, 2012  9:43 AM       Needs ct chest this month ------

## 2013-03-19 NOTE — Telephone Encounter (Signed)
Spoke with the pt and reminded him that it is time to set up CT Chest to f/u pulm nodule He verbalized understanding and denied any questions Order was sent to [facility]  Will call with results once MW reviews

## 2013-03-24 ENCOUNTER — Ambulatory Visit (INDEPENDENT_AMBULATORY_CARE_PROVIDER_SITE_OTHER)
Admission: RE | Admit: 2013-03-24 | Discharge: 2013-03-24 | Disposition: A | Discharge status: Home / Self Care | Payer: Medicare Other | Source: Ambulatory Visit | Attending: Internal Medicine | Admitting: Internal Medicine

## 2013-03-24 DIAGNOSIS — J8410 Pulmonary fibrosis, unspecified: Secondary | ICD-10-CM | POA: Diagnosis not present

## 2013-03-24 DIAGNOSIS — R911 Solitary pulmonary nodule: Secondary | ICD-10-CM | POA: Diagnosis not present

## 2013-03-25 ENCOUNTER — Encounter: Payer: Self-pay | Admitting: Internal Medicine

## 2013-03-26 ENCOUNTER — Telehealth: Payer: Self-pay | Admitting: Internal Medicine

## 2013-03-26 NOTE — Telephone Encounter (Signed)
Notes Recorded by Nyoka Cowden, MD on 03/25/2013 at 9:58 AM Call pt: Reviewed ct and no growth in nodule and he is relatively low risk so simply rec f/u cxr in 12 months with ov (placed in tickle file ) --  I spoke with patient about results and he verbalized understanding and had no questions

## 2013-03-27 DIAGNOSIS — H02839 Dermatochalasis of unspecified eye, unspecified eyelid: Secondary | ICD-10-CM | POA: Diagnosis not present

## 2013-03-27 DIAGNOSIS — H35039 Hypertensive retinopathy, unspecified eye: Secondary | ICD-10-CM | POA: Diagnosis not present

## 2013-03-27 DIAGNOSIS — H4011X0 Primary open-angle glaucoma, stage unspecified: Secondary | ICD-10-CM | POA: Diagnosis not present

## 2013-03-27 DIAGNOSIS — H04129 Dry eye syndrome of unspecified lacrimal gland: Secondary | ICD-10-CM | POA: Diagnosis not present

## 2013-03-27 DIAGNOSIS — H2510 Age-related nuclear cataract, unspecified eye: Secondary | ICD-10-CM | POA: Diagnosis not present

## 2013-03-27 DIAGNOSIS — H02409 Unspecified ptosis of unspecified eyelid: Secondary | ICD-10-CM | POA: Diagnosis not present

## 2013-03-27 DIAGNOSIS — H409 Unspecified glaucoma: Secondary | ICD-10-CM | POA: Diagnosis not present

## 2013-04-01 ENCOUNTER — Ambulatory Visit (INDEPENDENT_AMBULATORY_CARE_PROVIDER_SITE_OTHER): Payer: Medicare Other | Admitting: *Deleted

## 2013-04-01 DIAGNOSIS — D518 Other vitamin B12 deficiency anemias: Secondary | ICD-10-CM

## 2013-04-01 MED ORDER — CYANOCOBALAMIN 1000 MCG/ML IJ SOLN
1000.0000 ug | Freq: Once | INTRAMUSCULAR | Status: AC
  Administered 2013-04-01: 1000 ug via INTRAMUSCULAR

## 2013-04-02 ENCOUNTER — Ambulatory Visit: Payer: Medicare Other

## 2013-04-02 ENCOUNTER — Other Ambulatory Visit (HOSPITAL_BASED_OUTPATIENT_CLINIC_OR_DEPARTMENT_OTHER): Payer: Medicare Other | Admitting: Lab

## 2013-04-02 ENCOUNTER — Ambulatory Visit (HOSPITAL_BASED_OUTPATIENT_CLINIC_OR_DEPARTMENT_OTHER): Payer: Medicare Other | Admitting: Hematology & Oncology

## 2013-04-02 VITALS — BP 157/53 | HR 76 | Temp 97.7°F | Resp 18 | Ht 69.0 in | Wt 175.0 lb

## 2013-04-02 DIAGNOSIS — D649 Anemia, unspecified: Secondary | ICD-10-CM

## 2013-04-02 DIAGNOSIS — D469 Myelodysplastic syndrome, unspecified: Secondary | ICD-10-CM

## 2013-04-02 DIAGNOSIS — D631 Anemia in chronic kidney disease: Secondary | ICD-10-CM

## 2013-04-02 LAB — CBC WITH DIFFERENTIAL (CANCER CENTER ONLY)
BASO%: 0.5 % (ref 0.0–2.0)
EOS%: 3.6 % (ref 0.0–7.0)
Eosinophils Absolute: 0.1 10*3/uL (ref 0.0–0.5)
LYMPH%: 34.3 % (ref 14.0–48.0)
MCH: 35.6 pg — ABNORMAL HIGH (ref 28.0–33.4)
MCV: 108 fL — ABNORMAL HIGH (ref 82–98)
MONO%: 7 % (ref 0.0–13.0)
NEUT#: 2.1 10*3/uL (ref 1.5–6.5)
Platelets: 342 10*3/uL (ref 145–400)
RBC: 2.89 10*6/uL — ABNORMAL LOW (ref 4.20–5.70)
RDW: 14.3 % (ref 11.1–15.7)

## 2013-04-02 LAB — FERRITIN: Ferritin: 606 ng/mL — ABNORMAL HIGH (ref 22–322)

## 2013-04-02 NOTE — Progress Notes (Signed)
Pt's Hgb was 10.3. No Aranesp needed per Dr Myna Hidalgo.

## 2013-04-02 NOTE — Progress Notes (Signed)
This office note has been dictated.

## 2013-04-03 NOTE — Progress Notes (Signed)
CC:   [name], MD  DIAGNOSES: 1. Anemia of renal insufficiency. 2. Refractory anemia. 3. Pernicious anemia.  CURRENT THERAPY: 1. Aranesp 300 mcg subcu as needed for hemoglobin less than 10. 2. Vitamin B12, 1 mg IM q. monthly by Dr. Cato Mulligan.  INTERIM HISTORY:  Mr. Tuzzolino comes in for followup.  It has been 2 months since we saw him.  He has been doing okay.  He is still having the back issues.  He will always have these issues that bother him more than anything else.  He has had no fatigue.  He has had no bleeding.  There has been no change in bowel or bladder habits.  He has had no nausea or vomiting. He has had no leg swelling.  He has had no rashes.  There has been no change in his medicines.  He did have a CT of the chest.  This was done on 03/24/2013.  He had a 5- mm left lower lobe pulmonary nodule that was unchanged.  There are other nodules in the lungs that appear to be stable.  Of course, the radiologist made recommendations for followup of these lesions.  I am sure that Dr. Cato Mulligan will take care of this.  PHYSICAL EXAMINATION:  General:  This is an elderly, thin white gentleman, in no obvious distress.  Vital signs:  Temperature of 97.7, pulse 76, respiratory rate 18, blood pressure 157/53.  Weight is 175. Head and neck:  Normocephalic, atraumatic skull.  There are no ocular or oral lesions.  There are no palpable cervical or supraclavicular lymph nodes.  Lungs:  Clear bilaterally.  Cardiac:  Regular rate and rhythm, with a normal S1 and S2.  He has a 1/6 systolic ejection murmur. Abdomen:  Soft, with good bowel sounds.  There is no palpable abdominal mass.  There is no fluid wave.  There is no palpable hepatosplenomegaly. Back:  Shows kyphosis.  Extremities:  Show some 1+ edema in his ankles and feet.  Skin:  No rashes, ecchymoses or petechia.  LABORATORY STUDIES:  White cell count is 3.9, hemoglobin 10.3, hematocrit 31.2, platelet count 342.  MCV is  108.  IMPRESSION:  Mr. Holaway is a 79 year old gentleman with multifactorial anemia.  He does not need an Aranesp shot today.  We will go ahead and plan to get him back in another 6 weeks or so.  Again, I am not too worried about the pulmonary nodules.  I do not think Mr. Wohl has an extensive smoking history, by any means.  He is totally asymptomatic with these pulmonary nodules.    ______________________________ Josph Macho, M.D. PRE/MEDQ  D:  04/02/2013  T:  04/03/2013  Job:  [id number]

## 2013-04-09 DIAGNOSIS — M79609 Pain in unspecified limb: Secondary | ICD-10-CM | POA: Diagnosis not present

## 2013-04-09 DIAGNOSIS — B351 Tinea unguium: Secondary | ICD-10-CM | POA: Diagnosis not present

## 2013-04-30 ENCOUNTER — Other Ambulatory Visit (HOSPITAL_BASED_OUTPATIENT_CLINIC_OR_DEPARTMENT_OTHER): Payer: Medicare Other | Admitting: Lab

## 2013-04-30 ENCOUNTER — Ambulatory Visit (HOSPITAL_BASED_OUTPATIENT_CLINIC_OR_DEPARTMENT_OTHER): Payer: Medicare Other

## 2013-04-30 VITALS — BP 134/54 | HR 69 | Temp 97.6°F | Resp 18

## 2013-04-30 DIAGNOSIS — N259 Disorder resulting from impaired renal tubular function, unspecified: Secondary | ICD-10-CM

## 2013-04-30 DIAGNOSIS — D649 Anemia, unspecified: Secondary | ICD-10-CM | POA: Diagnosis not present

## 2013-04-30 DIAGNOSIS — D631 Anemia in chronic kidney disease: Secondary | ICD-10-CM

## 2013-04-30 DIAGNOSIS — D469 Myelodysplastic syndrome, unspecified: Secondary | ICD-10-CM

## 2013-04-30 LAB — CBC WITH DIFFERENTIAL (CANCER CENTER ONLY)
BASO#: 0 10*3/uL (ref 0.0–0.2)
EOS%: 2.7 % (ref 0.0–7.0)
HCT: 27.8 % — ABNORMAL LOW (ref 38.7–49.9)
HGB: 9.3 g/dL — ABNORMAL LOW (ref 13.0–17.1)
MCH: 35.6 pg — ABNORMAL HIGH (ref 28.0–33.4)
MCHC: 33.5 g/dL (ref 32.0–35.9)
MONO%: 9.1 % (ref 0.0–13.0)
NEUT%: 55.3 % (ref 40.0–80.0)

## 2013-04-30 MED ORDER — DARBEPOETIN ALFA-POLYSORBATE 300 MCG/0.6ML IJ SOLN
300.0000 ug | INTRAMUSCULAR | Status: DC
  Administered 2013-04-30: 300 ug via SUBCUTANEOUS

## 2013-04-30 NOTE — Patient Instructions (Signed)
Darbepoetin Alfa injection What is this medicine? DARBEPOETIN ALFA (dar be POE e tin AL fa) helps your body make more red blood cells. It is used to treat anemia caused by chronic kidney failure and chemotherapy. This medicine may be used for other purposes; ask your health care provider or pharmacist if you have questions. What should I tell my health care provider before I take this medicine? They need to know if you have any of these conditions: -blood clotting disorders or history of blood clots -cancer patient not on chemotherapy -cystic fibrosis -heart disease, such as angina, heart failure, or a history of a heart attack -hemoglobin level of 12 g/dL or greater -high blood pressure -low levels of folate, iron, or vitamin B12 -seizures -an unusual or allergic reaction to darbepoetin, erythropoietin, albumin, hamster proteins, latex, other medicines, foods, dyes, or preservatives -pregnant or trying to get pregnant -breast-feeding How should I use this medicine? This medicine is for injection into a vein or under the skin. It is usually given by a health care professional in a hospital or clinic setting. If you get this medicine at home, you will be taught how to prepare and give this medicine. Do not shake the solution before you withdraw a dose. Use exactly as directed. Take your medicine at regular intervals. Do not take your medicine more often than directed. It is important that you put your used needles and syringes in a special sharps container. Do not put them in a trash can. If you do not have a sharps container, call your pharmacist or healthcare provider to get one. Talk to your pediatrician regarding the use of this medicine in children. While this medicine may be used in children as young as 1 year for selected conditions, precautions do apply. Overdosage: If you think you have taken too much of this medicine contact a poison control center or emergency room at once. NOTE:  This medicine is only for you. Do not share this medicine with others. What if I miss a dose? If you miss a dose, take it as soon as you can. If it is almost time for your next dose, take only that dose. Do not take double or extra doses. What may interact with this medicine? Do not take this medicine with any of the following medications: -epoetin alfa This list may not describe all possible interactions. Give your health care provider a list of all the medicines, herbs, non-prescription drugs, or dietary supplements you use. Also tell them if you smoke, drink alcohol, or use illegal drugs. Some items may interact with your medicine. What should I watch for while using this medicine? Visit your prescriber or health care professional for regular checks on your progress and for the needed blood tests and blood pressure measurements. It is especially important for the doctor to make sure your hemoglobin level is in the desired range, to limit the risk of potential side effects and to give you the best benefit. Keep all appointments for any recommended tests. Check your blood pressure as directed. Ask your doctor what your blood pressure should be and when you should contact him or her. As your body makes more red blood cells, you may need to take iron, folic acid, or vitamin B supplements. Ask your doctor or health care provider which products are right for you. If you have kidney disease continue dietary restrictions, even though this medication can make you feel better. Talk with your doctor or health care professional about the   foods you eat and the vitamins that you take. What side effects may I notice from receiving this medicine? Side effects that you should report to your doctor or health care professional as soon as possible: -allergic reactions like skin rash, itching or hives, swelling of the face, lips, or tongue -breathing problems -changes in vision -chest pain -confusion, trouble speaking  or understanding -feeling faint or lightheaded, falls -high blood pressure -muscle aches or pains -pain, swelling, warmth in the leg -rapid weight gain -severe headaches -sudden numbness or weakness of the face, arm or leg -trouble walking, dizziness, loss of balance or coordination -seizures (convulsions) -swelling of the ankles, feet, hands -unusually weak or tired Side effects that usually do not require medical attention (report to your doctor or health care professional if they continue or are bothersome): -diarrhea -fever, chills (flu-like symptoms) -headaches -nausea, vomiting -redness, stinging, or swelling at site where injected This list may not describe all possible side effects. Call your doctor for medical advice about side effects. You may report side effects to FDA at 1-800-FDA-1088. Where should I keep my medicine? Keep out of the reach of children. Store in a refrigerator between 2 and 8 degrees C (36 and 46 degrees F). Do not freeze. Do not shake. Throw away any unused portion if using a single-dose vial. Throw away any unused medicine after the expiration date. NOTE: This sheet is a summary. It may not cover all possible information. If you have questions about this medicine, talk to your doctor, pharmacist, or health care provider.  2013, [employer]. (10/19/2008 10:23:57 AM)  

## 2013-05-04 DIAGNOSIS — H409 Unspecified glaucoma: Secondary | ICD-10-CM | POA: Diagnosis not present

## 2013-05-04 DIAGNOSIS — H4011X0 Primary open-angle glaucoma, stage unspecified: Secondary | ICD-10-CM | POA: Diagnosis not present

## 2013-05-18 DIAGNOSIS — H409 Unspecified glaucoma: Secondary | ICD-10-CM | POA: Diagnosis not present

## 2013-05-18 DIAGNOSIS — H4011X0 Primary open-angle glaucoma, stage unspecified: Secondary | ICD-10-CM | POA: Diagnosis not present

## 2013-05-28 ENCOUNTER — Other Ambulatory Visit: Payer: Self-pay | Admitting: Medical

## 2013-05-28 ENCOUNTER — Ambulatory Visit (HOSPITAL_BASED_OUTPATIENT_CLINIC_OR_DEPARTMENT_OTHER): Payer: Medicare Other

## 2013-05-28 ENCOUNTER — Other Ambulatory Visit (HOSPITAL_BASED_OUTPATIENT_CLINIC_OR_DEPARTMENT_OTHER): Payer: Medicare Other | Admitting: Lab

## 2013-05-28 VITALS — BP 143/60 | HR 65 | Temp 96.7°F | Resp 16

## 2013-05-28 DIAGNOSIS — N259 Disorder resulting from impaired renal tubular function, unspecified: Secondary | ICD-10-CM

## 2013-05-28 DIAGNOSIS — D649 Anemia, unspecified: Secondary | ICD-10-CM | POA: Diagnosis not present

## 2013-05-28 DIAGNOSIS — N189 Chronic kidney disease, unspecified: Secondary | ICD-10-CM | POA: Diagnosis not present

## 2013-05-28 DIAGNOSIS — D631 Anemia in chronic kidney disease: Secondary | ICD-10-CM

## 2013-05-28 DIAGNOSIS — D469 Myelodysplastic syndrome, unspecified: Secondary | ICD-10-CM | POA: Diagnosis not present

## 2013-05-28 LAB — CBC WITH DIFFERENTIAL (CANCER CENTER ONLY)
BASO#: 0 10*3/uL (ref 0.0–0.2)
EOS%: 2.8 % (ref 0.0–7.0)
HGB: 9.4 g/dL — ABNORMAL LOW (ref 13.0–17.1)
LYMPH%: 27.3 % (ref 14.0–48.0)
MCH: 35.9 pg — ABNORMAL HIGH (ref 28.0–33.4)
MCHC: 33.3 g/dL (ref 32.0–35.9)
MONO%: 8.8 % (ref 0.0–13.0)
NEUT#: 2.2 10*3/uL (ref 1.5–6.5)

## 2013-05-28 MED ORDER — DARBEPOETIN ALFA-POLYSORBATE 300 MCG/0.6ML IJ SOLN
300.0000 ug | INTRAMUSCULAR | Status: DC
  Administered 2013-05-28: 300 ug via SUBCUTANEOUS

## 2013-05-28 NOTE — Patient Instructions (Addendum)
Darbepoetin Alfa injection What is this medicine? DARBEPOETIN ALFA (dar be POE e tin AL fa) helps your body make more red blood cells. It is used to treat anemia caused by chronic kidney failure and chemotherapy. This medicine may be used for other purposes; ask your health care provider or pharmacist if you have questions. What should I tell my health care provider before I take this medicine? They need to know if you have any of these conditions: -blood clotting disorders or history of blood clots -cancer patient not on chemotherapy -cystic fibrosis -heart disease, such as angina, heart failure, or a history of a heart attack -hemoglobin level of 12 g/dL or greater -high blood pressure -low levels of folate, iron, or vitamin B12 -seizures -an unusual or allergic reaction to darbepoetin, erythropoietin, albumin, hamster proteins, latex, other medicines, foods, dyes, or preservatives -pregnant or trying to get pregnant -breast-feeding How should I use this medicine? This medicine is for injection into a vein or under the skin. It is usually given by a health care professional in a hospital or clinic setting. If you get this medicine at home, you will be taught how to prepare and give this medicine. Do not shake the solution before you withdraw a dose. Use exactly as directed. Take your medicine at regular intervals. Do not take your medicine more often than directed. It is important that you put your used needles and syringes in a special sharps container. Do not put them in a trash can. If you do not have a sharps container, call your pharmacist or healthcare provider to get one. Talk to your pediatrician regarding the use of this medicine in children. While this medicine may be used in children as young as 1 year for selected conditions, precautions do apply. Overdosage: If you think you have taken too much of this medicine contact a poison control center or emergency room at once. NOTE:  This medicine is only for you. Do not share this medicine with others. What if I miss a dose? If you miss a dose, take it as soon as you can. If it is almost time for your next dose, take only that dose. Do not take double or extra doses. What may interact with this medicine? Do not take this medicine with any of the following medications: -epoetin alfa This list may not describe all possible interactions. Give your health care provider a list of all the medicines, herbs, non-prescription drugs, or dietary supplements you use. Also tell them if you smoke, drink alcohol, or use illegal drugs. Some items may interact with your medicine. What should I watch for while using this medicine? Visit your prescriber or health care professional for regular checks on your progress and for the needed blood tests and blood pressure measurements. It is especially important for the doctor to make sure your hemoglobin level is in the desired range, to limit the risk of potential side effects and to give you the best benefit. Keep all appointments for any recommended tests. Check your blood pressure as directed. Ask your doctor what your blood pressure should be and when you should contact him or her. As your body makes more red blood cells, you may need to take iron, folic acid, or vitamin B supplements. Ask your doctor or health care provider which products are right for you. If you have kidney disease continue dietary restrictions, even though this medication can make you feel better. Talk with your doctor or health care professional about the   foods you eat and the vitamins that you take. What side effects may I notice from receiving this medicine? Side effects that you should report to your doctor or health care professional as soon as possible: -allergic reactions like skin rash, itching or hives, swelling of the face, lips, or tongue -breathing problems -changes in vision -chest pain -confusion, trouble speaking  or understanding -feeling faint or lightheaded, falls -high blood pressure -muscle aches or pains -pain, swelling, warmth in the leg -rapid weight gain -severe headaches -sudden numbness or weakness of the face, arm or leg -trouble walking, dizziness, loss of balance or coordination -seizures (convulsions) -swelling of the ankles, feet, hands -unusually weak or tired Side effects that usually do not require medical attention (report to your doctor or health care professional if they continue or are bothersome): -diarrhea -fever, chills (flu-like symptoms) -headaches -nausea, vomiting -redness, stinging, or swelling at site where injected This list may not describe all possible side effects. Call your doctor for medical advice about side effects. You may report side effects to FDA at 1-800-FDA-1088. Where should I keep my medicine? Keep out of the reach of children. Store in a refrigerator between 2 and 8 degrees C (36 and 46 degrees F). Do not freeze. Do not shake. Throw away any unused portion if using a single-dose vial. Throw away any unused medicine after the expiration date. NOTE: This sheet is a summary. It may not cover all possible information. If you have questions about this medicine, talk to your doctor, pharmacist, or health care provider.  2013, [employer] Standard. (10/19/2008 10:23:57 AM)  

## 2013-05-29 ENCOUNTER — Ambulatory Visit (INDEPENDENT_AMBULATORY_CARE_PROVIDER_SITE_OTHER): Payer: Medicare Other | Admitting: Internal Medicine

## 2013-05-29 DIAGNOSIS — D518 Other vitamin B12 deficiency anemias: Secondary | ICD-10-CM

## 2013-05-29 MED ORDER — CYANOCOBALAMIN 1000 MCG/ML IJ SOLN
1000.0000 ug | Freq: Once | INTRAMUSCULAR | Status: AC
  Administered 2013-05-29: 1000 ug via INTRAMUSCULAR

## 2013-06-08 ENCOUNTER — Other Ambulatory Visit: Payer: Self-pay | Admitting: *Deleted

## 2013-06-08 MED ORDER — SIMVASTATIN 40 MG PO TABS: 40.0000 mg | ORAL_TABLET | Freq: Every day | ORAL | Status: DC

## 2013-06-24 ENCOUNTER — Other Ambulatory Visit: Payer: Self-pay | Admitting: *Deleted

## 2013-06-24 MED ORDER — ISOSORBIDE MONONITRATE ER 30 MG PO TB24: 30.0000 mg | ORAL_TABLET | Freq: Every day | ORAL | Status: DC

## 2013-06-25 ENCOUNTER — Ambulatory Visit: Payer: Medicare Other

## 2013-06-25 ENCOUNTER — Other Ambulatory Visit (HOSPITAL_BASED_OUTPATIENT_CLINIC_OR_DEPARTMENT_OTHER): Payer: Medicare Other | Admitting: Lab

## 2013-06-25 ENCOUNTER — Ambulatory Visit (HOSPITAL_BASED_OUTPATIENT_CLINIC_OR_DEPARTMENT_OTHER): Payer: Medicare Other | Admitting: Hematology & Oncology

## 2013-06-25 VITALS — BP 127/48 | HR 64 | Temp 97.8°F | Resp 16 | Ht 68.0 in | Wt 176.0 lb

## 2013-06-25 DIAGNOSIS — N189 Chronic kidney disease, unspecified: Secondary | ICD-10-CM

## 2013-06-25 DIAGNOSIS — D649 Anemia, unspecified: Secondary | ICD-10-CM

## 2013-06-25 DIAGNOSIS — D469 Myelodysplastic syndrome, unspecified: Secondary | ICD-10-CM

## 2013-06-25 DIAGNOSIS — N289 Disorder of kidney and ureter, unspecified: Secondary | ICD-10-CM

## 2013-06-25 DIAGNOSIS — D631 Anemia in chronic kidney disease: Secondary | ICD-10-CM | POA: Diagnosis not present

## 2013-06-25 LAB — CBC WITH DIFFERENTIAL (CANCER CENTER ONLY)
BASO#: 0 10*3/uL (ref 0.0–0.2)
BASO%: 0.2 % (ref 0.0–2.0)
EOS%: 3.7 % (ref 0.0–7.0)
LYMPH#: 1.3 10*3/uL (ref 0.9–3.3)
MCH: 36.3 pg — ABNORMAL HIGH (ref 28.0–33.4)
MCHC: 33.5 g/dL (ref 32.0–35.9)
MONO%: 9.1 % (ref 0.0–13.0)
NEUT#: 2.3 10*3/uL (ref 1.5–6.5)
NEUT%: 55.8 % (ref 40.0–80.0)
RDW: 14.1 % (ref 11.1–15.7)

## 2013-06-25 LAB — IRON AND TIBC CHCC
Iron: 170 ug/dL — ABNORMAL HIGH (ref 42–163)
TIBC: 203 ug/dL (ref 202–409)

## 2013-06-25 LAB — RETICULOCYTES (CHCC)
ABS Retic: 21.7 10*3/uL (ref 19.0–186.0)
RBC.: 2.71 MIL/uL — ABNORMAL LOW (ref 4.22–5.81)

## 2013-06-25 LAB — CHCC SATELLITE - SMEAR

## 2013-06-25 NOTE — Progress Notes (Signed)
This office note has been dictated.

## 2013-06-26 NOTE — Progress Notes (Signed)
CC:   Alexander H. Swords, MD  DIAGNOSIS: 1. Anemia of renal insufficiency. 2. Refractory anemia. 3. Pernicious anemia.  CURRENT THERAPY: 1. Aranesp 300 mcg subcu as needed for hemoglobin less than 10 (this     is flexible.). 2. Vitamin B12 1 mg IM monthly.  INTERIM HISTORY:  Alexander Rice comes in for followup.  He is doing okay. His biggest problem continues to be his back.  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  Unfortunately, nothing much can be done for this.  He is going to be working at [employer] in [place] next week.  He is looking forward to this.  He has had no problems with bleeding or bruising.  He has had no problems with  infections.  We do monitor his iron studies.  Back when we saw him in May, his ferritin was 606.  PHYSICAL EXAMINATION:  General:  This is an elderly white gentleman with marked kyphosis.  Vital Signs:  Temperature of 97.8, pulse 64, respiratory rate 16, blood pressure 127/48.  Weight is 176.  Head and Neck:  Normocephalic, atraumatic skull.  There are no ocular or oral lesions.  There are no palpable cervical or supraclavicular lymph nodes. Lungs:  Clear bilaterally.  Cardiac:  Regular rate and rhythm with a normal S1, S2.  There are no murmurs, rubs or bruits.  Abdomen:  Soft. He has good bowel sounds.  There is no fluid wave.  There is no palpable hepatosplenomegaly.  Back:  Shows marked kyphosis.  There is no tenderness over the spine, ribs, or hips.  Extremities:  Show no clubbing, cyanosis or edema.  He has osteoarthritic changes in his joints.  Skin:  No rashes, ecchymosis, or petechia.  LABORATORY STUDIES:  White cell count is 4.1, hemoglobin 9.5, hematocrit 28.4, platelet count 345,  MCV is 108.  IMPRESSION:  Alexander Rice is a 79 year old gentleman with anemia of renal insufficiency.  I suspect he also has myelodysplasia.  We have not done a bone marrow on him as I just did not feel this was going to change how we managed  him.  He says he feels good right now.  As such, I do not think we need to give him a dose of Aranesp.  He gets B12 from his family doctor.  We will plan to have him continue monthly followup.  I will plan to see him back myself in about 3 months or so.    ______________________________ Josph Macho, M.D. PRE/MEDQ  D:  06/25/2013  T:  06/26/2013  Job:  [id number]

## 2013-06-30 ENCOUNTER — Ambulatory Visit (INDEPENDENT_AMBULATORY_CARE_PROVIDER_SITE_OTHER): Payer: Medicare Other | Admitting: *Deleted

## 2013-06-30 DIAGNOSIS — E538 Deficiency of other specified B group vitamins: Secondary | ICD-10-CM | POA: Diagnosis not present

## 2013-06-30 MED ORDER — CYANOCOBALAMIN 1000 MCG/ML IJ SOLN
1000.0000 ug | Freq: Once | INTRAMUSCULAR | Status: AC
  Administered 2013-06-30: 1000 ug via INTRAMUSCULAR

## 2013-07-09 DIAGNOSIS — E1149 Type 2 diabetes mellitus with other diabetic neurological complication: Secondary | ICD-10-CM | POA: Diagnosis not present

## 2013-07-09 DIAGNOSIS — M79609 Pain in unspecified limb: Secondary | ICD-10-CM | POA: Diagnosis not present

## 2013-07-09 DIAGNOSIS — B351 Tinea unguium: Secondary | ICD-10-CM | POA: Diagnosis not present

## 2013-07-13 DIAGNOSIS — H409 Unspecified glaucoma: Secondary | ICD-10-CM | POA: Diagnosis not present

## 2013-07-13 DIAGNOSIS — H4011X0 Primary open-angle glaucoma, stage unspecified: Secondary | ICD-10-CM | POA: Diagnosis not present

## 2013-07-21 ENCOUNTER — Encounter: Payer: Self-pay | Admitting: Internal Medicine

## 2013-07-21 ENCOUNTER — Ambulatory Visit (INDEPENDENT_AMBULATORY_CARE_PROVIDER_SITE_OTHER): Payer: Medicare Other | Admitting: Internal Medicine

## 2013-07-21 VITALS — BP 144/50 | Temp 98.1°F | Wt 174.0 lb

## 2013-07-21 DIAGNOSIS — I10 Essential (primary) hypertension: Secondary | ICD-10-CM

## 2013-07-21 DIAGNOSIS — E785 Hyperlipidemia, unspecified: Secondary | ICD-10-CM | POA: Diagnosis not present

## 2013-07-21 DIAGNOSIS — N259 Disorder resulting from impaired renal tubular function, unspecified: Secondary | ICD-10-CM

## 2013-07-21 LAB — LIPID PANEL
HDL: 78.8 mg/dL (ref >39.00)
LDL Cholesterol: 26 mg/dL (ref 0–99)
Total CHOL/HDL Ratio: 1
Triglycerides: 62 mg/dL (ref 0.0–149.0)

## 2013-07-21 LAB — HEPATIC FUNCTION PANEL
Alkaline Phosphatase: 36 U/L — ABNORMAL LOW (ref 39–117)
Bilirubin, Direct: 0 mg/dL (ref 0.0–0.3)
Total Protein: 6.3 g/dL (ref 6.0–8.3)

## 2013-07-21 LAB — BASIC METABOLIC PANEL
CO2: 24 mEq/L (ref 19–32)
Calcium: 8.6 mg/dL (ref 8.4–10.5)
GFR: 59.24 mL/min — ABNORMAL LOW (ref >60.00)
Potassium: 4.3 mEq/L (ref 3.5–5.1)
Sodium: 132 mEq/L — ABNORMAL LOW (ref 135–145)

## 2013-07-21 NOTE — Progress Notes (Signed)
LE edema- only occurs later in the day-- he describes as mild  MDS- sees dr Myna Hidalgo  GERD- no sxs on meds- reviewed endoscopy from last year -2013  bph- sees urology q 6 months (the meds don't seem to help a lot)  Reviewed pmh, psh, sochx, surg hx  Ros- he is able to do all adls No complaints  Reviewed vitals  well-developed well-nourished male in no acute distress. HEENT exam atraumatic, normocephalic, neck supple without jugular venous distention. Chest clear to auscultation cardiac exam S1-S2 are regular. Abdominal exam overweight with bowel sounds, soft and nontender. Extremities no edema. Neurologic exam is alert with a normal gait.

## 2013-07-23 ENCOUNTER — Other Ambulatory Visit (HOSPITAL_BASED_OUTPATIENT_CLINIC_OR_DEPARTMENT_OTHER): Payer: Medicare Other | Admitting: Lab

## 2013-07-23 ENCOUNTER — Ambulatory Visit: Payer: Medicare Other

## 2013-07-23 DIAGNOSIS — D469 Myelodysplastic syndrome, unspecified: Secondary | ICD-10-CM

## 2013-07-23 DIAGNOSIS — D631 Anemia in chronic kidney disease: Secondary | ICD-10-CM

## 2013-07-23 DIAGNOSIS — D649 Anemia, unspecified: Secondary | ICD-10-CM

## 2013-07-23 LAB — CBC WITH DIFFERENTIAL (CANCER CENTER ONLY)
BASO%: 0.2 % (ref 0.0–2.0)
EOS%: 3 % (ref 0.0–7.0)
LYMPH#: 1.2 10*3/uL (ref 0.9–3.3)
MCHC: 33.8 g/dL (ref 32.0–35.9)
NEUT#: 2.7 10*3/uL (ref 1.5–6.5)
RDW: 13.1 % (ref 11.1–15.7)

## 2013-07-25 NOTE — Assessment & Plan Note (Signed)
Check labs today.

## 2013-07-25 NOTE — Assessment & Plan Note (Signed)
BP Readings from Last 3 Encounters:  07/21/13 144/50  06/25/13 127/48  05/28/13 143/60   Fair control Continue same meds

## 2013-07-25 NOTE — Assessment & Plan Note (Signed)
Lipid Panel     Component Value Date/Time   CHOL 117 07/21/2013 1010   TRIG 62.0 07/21/2013 1010   HDL 78.80 07/21/2013 1010   CHOLHDL 1 07/21/2013 1010   VLDL 12.4 07/21/2013 1010   LDLCALC 26 07/21/2013 1010  controlled continue same meds

## 2013-08-10 ENCOUNTER — Ambulatory Visit: Payer: Medicare Other | Admitting: Internal Medicine

## 2013-08-20 ENCOUNTER — Ambulatory Visit (HOSPITAL_BASED_OUTPATIENT_CLINIC_OR_DEPARTMENT_OTHER): Payer: Medicare Other

## 2013-08-20 ENCOUNTER — Other Ambulatory Visit (HOSPITAL_BASED_OUTPATIENT_CLINIC_OR_DEPARTMENT_OTHER): Payer: Medicare Other | Admitting: Lab

## 2013-08-20 VITALS — BP 141/59 | HR 68 | Temp 97.2°F

## 2013-08-20 DIAGNOSIS — D469 Myelodysplastic syndrome, unspecified: Secondary | ICD-10-CM

## 2013-08-20 DIAGNOSIS — D649 Anemia, unspecified: Secondary | ICD-10-CM

## 2013-08-20 DIAGNOSIS — N259 Disorder resulting from impaired renal tubular function, unspecified: Secondary | ICD-10-CM

## 2013-08-20 DIAGNOSIS — D631 Anemia in chronic kidney disease: Secondary | ICD-10-CM

## 2013-08-20 LAB — CBC WITH DIFFERENTIAL (CANCER CENTER ONLY)
BASO#: 0 10*3/uL (ref 0.0–0.2)
EOS%: 2.1 % (ref 0.0–7.0)
Eosinophils Absolute: 0.1 10*3/uL (ref 0.0–0.5)
HGB: 8.5 g/dL — ABNORMAL LOW (ref 13.0–17.1)
LYMPH#: 1.1 10*3/uL (ref 0.9–3.3)
NEUT#: 3.7 10*3/uL (ref 1.5–6.5)
RBC: 2.41 10*6/uL — ABNORMAL LOW (ref 4.20–5.70)
WBC: 5.3 10*3/uL (ref 4.0–10.0)

## 2013-08-20 MED ORDER — DARBEPOETIN ALFA-POLYSORBATE 300 MCG/0.6ML IJ SOLN
INTRAMUSCULAR | Status: AC
  Filled 2013-08-20: qty 0.6

## 2013-08-20 MED ORDER — DARBEPOETIN ALFA-POLYSORBATE 300 MCG/0.6ML IJ SOLN
300.0000 ug | INTRAMUSCULAR | Status: DC
  Administered 2013-08-20: 300 ug via SUBCUTANEOUS

## 2013-08-20 NOTE — Patient Instructions (Addendum)
Darbepoetin Alfa injection What is this medicine? DARBEPOETIN ALFA (dar be POE e tin AL fa) helps your body make more red blood cells. It is used to treat anemia caused by chronic kidney failure and chemotherapy. This medicine may be used for other purposes; ask your health care provider or pharmacist if you have questions. What should I tell my health care provider before I take this medicine? They need to know if you have any of these conditions: -blood clotting disorders or history of blood clots -cancer patient not on chemotherapy -cystic fibrosis -heart disease, such as angina, heart failure, or a history of a heart attack -hemoglobin level of 12 g/dL or greater -high blood pressure -low levels of folate, iron, or vitamin B12 -seizures -an unusual or allergic reaction to darbepoetin, erythropoietin, albumin, hamster proteins, latex, other medicines, foods, dyes, or preservatives -pregnant or trying to get pregnant -breast-feeding How should I use this medicine? This medicine is for injection into a vein or under the skin. It is usually given by a health care professional in a hospital or clinic setting. If you get this medicine at home, you will be taught how to prepare and give this medicine. Do not shake the solution before you withdraw a dose. Use exactly as directed. Take your medicine at regular intervals. Do not take your medicine more often than directed. It is important that you put your used needles and syringes in a special sharps container. Do not put them in a trash can. If you do not have a sharps container, call your pharmacist or healthcare provider to get one. Talk to your pediatrician regarding the use of this medicine in children. While this medicine may be used in children as young as 1 year for selected conditions, precautions do apply. Overdosage: If you think you have taken too much of this medicine contact a poison control center or emergency room at once. NOTE:  This medicine is only for you. Do not share this medicine with others. What if I miss a dose? If you miss a dose, take it as soon as you can. If it is almost time for your next dose, take only that dose. Do not take double or extra doses. What may interact with this medicine? Do not take this medicine with any of the following medications: -epoetin alfa This list may not describe all possible interactions. Give your health care provider a list of all the medicines, herbs, non-prescription drugs, or dietary supplements you use. Also tell them if you smoke, drink alcohol, or use illegal drugs. Some items may interact with your medicine. What should I watch for while using this medicine? Visit your prescriber or health care professional for regular checks on your progress and for the needed blood tests and blood pressure measurements. It is especially important for the doctor to make sure your hemoglobin level is in the desired range, to limit the risk of potential side effects and to give you the best benefit. Keep all appointments for any recommended tests. Check your blood pressure as directed. Ask your doctor what your blood pressure should be and when you should contact him or her. As your body makes more red blood cells, you may need to take iron, folic acid, or vitamin B supplements. Ask your doctor or health care provider which products are right for you. If you have kidney disease continue dietary restrictions, even though this medication can make you feel better. Talk with your doctor or health care professional about the   foods you eat and the vitamins that you take. What side effects may I notice from receiving this medicine? Side effects that you should report to your doctor or health care professional as soon as possible: -allergic reactions like skin rash, itching or hives, swelling of the face, lips, or tongue -breathing problems -changes in vision -chest pain -confusion, trouble speaking  or understanding -feeling faint or lightheaded, falls -high blood pressure -muscle aches or pains -pain, swelling, warmth in the leg -rapid weight gain -severe headaches -sudden numbness or weakness of the face, arm or leg -trouble walking, dizziness, loss of balance or coordination -seizures (convulsions) -swelling of the ankles, feet, hands -unusually weak or tired Side effects that usually do not require medical attention (report to your doctor or health care professional if they continue or are bothersome): -diarrhea -fever, chills (flu-like symptoms) -headaches -nausea, vomiting -redness, stinging, or swelling at site where injected This list may not describe all possible side effects. Call your doctor for medical advice about side effects. You may report side effects to FDA at 1-800-FDA-1088. Where should I keep my medicine? Keep out of the reach of children. Store in a refrigerator between 2 and 8 degrees C (36 and 46 degrees F). Do not freeze. Do not shake. Throw away any unused portion if using a single-dose vial. Throw away any unused medicine after the expiration date. NOTE: This sheet is a summary. It may not cover all possible information. If you have questions about this medicine, talk to your doctor, pharmacist, or health care provider.  2012, Elsevier/Gold Standard. (10/19/2008 10:23:57 AM) 

## 2013-09-07 ENCOUNTER — Ambulatory Visit (INDEPENDENT_AMBULATORY_CARE_PROVIDER_SITE_OTHER): Payer: Medicare Other | Admitting: Internal Medicine

## 2013-09-07 DIAGNOSIS — E538 Deficiency of other specified B group vitamins: Secondary | ICD-10-CM

## 2013-09-07 MED ORDER — CYANOCOBALAMIN 1000 MCG/ML IJ SOLN
1000.0000 ug | Freq: Once | INTRAMUSCULAR | Status: AC
  Administered 2013-09-07: 1000 ug via INTRAMUSCULAR

## 2013-09-17 ENCOUNTER — Ambulatory Visit: Payer: Medicare Other

## 2013-09-17 ENCOUNTER — Ambulatory Visit (HOSPITAL_BASED_OUTPATIENT_CLINIC_OR_DEPARTMENT_OTHER): Payer: Medicare Other | Admitting: Hematology & Oncology

## 2013-09-17 ENCOUNTER — Telehealth: Payer: Self-pay | Admitting: Hematology & Oncology

## 2013-09-17 ENCOUNTER — Other Ambulatory Visit (HOSPITAL_BASED_OUTPATIENT_CLINIC_OR_DEPARTMENT_OTHER): Payer: Medicare Other | Admitting: Lab

## 2013-09-17 VITALS — BP 189/60 | HR 59 | Temp 97.5°F | Resp 18 | Ht 68.0 in | Wt 172.0 lb

## 2013-09-17 DIAGNOSIS — D649 Anemia, unspecified: Secondary | ICD-10-CM

## 2013-09-17 DIAGNOSIS — D469 Myelodysplastic syndrome, unspecified: Secondary | ICD-10-CM | POA: Diagnosis not present

## 2013-09-17 LAB — CBC WITH DIFFERENTIAL (CANCER CENTER ONLY)
BASO#: 0 10*3/uL (ref 0.0–0.2)
BASO%: 0.3 % (ref 0.0–2.0)
LYMPH#: 1.2 10*3/uL (ref 0.9–3.3)
LYMPH%: 31 % (ref 14.0–48.0)
MONO#: 0.3 10*3/uL (ref 0.1–0.9)
NEUT#: 2.3 10*3/uL (ref 1.5–6.5)
Platelets: 427 10*3/uL — ABNORMAL HIGH (ref 145–400)
RDW: 14.9 % (ref 11.1–15.7)
WBC: 3.9 10*3/uL — ABNORMAL LOW (ref 4.0–10.0)

## 2013-09-17 LAB — RETICULOCYTES (CHCC)
ABS Retic: 21.9 10*3/uL (ref 19.0–186.0)
Retic Ct Pct: 0.8 % (ref 0.4–2.3)

## 2013-09-17 LAB — FERRITIN CHCC: Ferritin: 498 ng/ml — ABNORMAL HIGH (ref 22–316)

## 2013-09-17 LAB — IRON AND TIBC CHCC
%SAT: UNDETERMINED % (ref 20–?)
Iron: 205 ug/dL — ABNORMAL HIGH (ref 42–163)
UIBC: UNDETERMINED ug/dL (ref 117–376)

## 2013-09-17 NOTE — Progress Notes (Signed)
No injections needed today per Dr. Myna Hidalgo.

## 2013-09-17 NOTE — Progress Notes (Signed)
This office note has been dictated.

## 2013-09-17 NOTE — Telephone Encounter (Signed)
Per orders to sch follow up visits.  Apts were scheduled and mailed out to patient's home

## 2013-09-18 ENCOUNTER — Other Ambulatory Visit: Payer: Self-pay | Admitting: *Deleted

## 2013-09-18 MED ORDER — OMEPRAZOLE 20 MG PO CPDR: 20.0000 mg | DELAYED_RELEASE_CAPSULE | Freq: Every day | ORAL | Status: DC

## 2013-09-18 NOTE — Progress Notes (Signed)
CC:   [name], MD  DIAGNOSES: 1. Anemia of renal insufficiency. 2. Refractory anemia. 3. Pernicious anemia.  CURRENT THERAPY: 1. Aranesp 300 mcg subcu as needed for hemoglobin less than 9-10. 2. Vitamin B12 1 mg IM monthly -- given by family doctor apparent.  INTERIM HISTORY:  Mr. Brentlinger comes in for his followup.  He feels pretty good.  He has had no real problems outside of his poor back.  He does have a lot of lower back issues that really bother him.  He has had no problem with fever, sweats, or chills.  There has been no change in his medications.  He last received Aranesp back on October 2nd.  When we checked his iron studies, his last ferritin was 530 with an iron saturation of 84%.  He has had no cough or shortness of breath.  His appetite has been fairly good.  PHYSICAL EXAMINATION:  General:  This is an elderly white gentleman in no obvious distress.  Vital signs:  Temperature of 97.5, pulse 59, respiratory rate 18, blood pressure 174/80.  Weight is 172 pounds.  Head and Neck:  Normocephalic, atraumatic skull.  There are no ocular or oral lesions.  He has no palpable cervical or supraclavicular lymph nodes. Lungs:  Clear bilaterally.  Cardiac:  Regular rate and rhythm with occasional extra beat.  He has a 1/6 systolic ejection murmur.  Abdomen: Soft.  He has good bowel sounds.  There is no fluid wave.  There is no palpable abdominal mass.  There is no palpable hepatosplenomegaly. Back:  Kyphosis.  No tenderness is noted over the spine.  Extremities: No clubbing, cyanosis, or edema.  He has some age-related osteoarthritic changes in his joints.  Skin:  No rashes, ecchymoses, or petechiae.  LABORATORY STUDIES:  White cell count is 3.9, hemoglobin 9.3, hematocrit 27.7, platelet count 427.  MCV is 107.  IMPRESSION:  Mr. Loe is a 79 year old gentleman with multifactorial anemia.  He does not want any Aranesp today.  He comes in every 3 to 4 weeks  for lab work to see about Aranesp.  I will plan to see him back, as always, in 3 months.    ______________________________ Josph Macho, M.D. PRE/MEDQ  D:  09/17/2013  T:  09/18/2013  Job:  [id number]

## 2013-10-01 DIAGNOSIS — B351 Tinea unguium: Secondary | ICD-10-CM | POA: Diagnosis not present

## 2013-10-01 DIAGNOSIS — M79609 Pain in unspecified limb: Secondary | ICD-10-CM | POA: Diagnosis not present

## 2013-10-05 DIAGNOSIS — H02409 Unspecified ptosis of unspecified eyelid: Secondary | ICD-10-CM | POA: Diagnosis not present

## 2013-10-05 DIAGNOSIS — H4011X0 Primary open-angle glaucoma, stage unspecified: Secondary | ICD-10-CM | POA: Diagnosis not present

## 2013-10-05 DIAGNOSIS — H409 Unspecified glaucoma: Secondary | ICD-10-CM | POA: Diagnosis not present

## 2013-10-05 DIAGNOSIS — H04129 Dry eye syndrome of unspecified lacrimal gland: Secondary | ICD-10-CM | POA: Diagnosis not present

## 2013-10-14 ENCOUNTER — Ambulatory Visit (HOSPITAL_BASED_OUTPATIENT_CLINIC_OR_DEPARTMENT_OTHER): Payer: Medicare Other

## 2013-10-14 ENCOUNTER — Other Ambulatory Visit (HOSPITAL_BASED_OUTPATIENT_CLINIC_OR_DEPARTMENT_OTHER): Payer: Medicare Other | Admitting: Lab

## 2013-10-14 VITALS — BP 179/62 | HR 70 | Temp 98.1°F | Resp 18

## 2013-10-14 DIAGNOSIS — N259 Disorder resulting from impaired renal tubular function, unspecified: Secondary | ICD-10-CM

## 2013-10-14 DIAGNOSIS — N189 Chronic kidney disease, unspecified: Secondary | ICD-10-CM

## 2013-10-14 DIAGNOSIS — D469 Myelodysplastic syndrome, unspecified: Secondary | ICD-10-CM

## 2013-10-14 DIAGNOSIS — D631 Anemia in chronic kidney disease: Secondary | ICD-10-CM | POA: Diagnosis not present

## 2013-10-14 LAB — CBC WITH DIFFERENTIAL (CANCER CENTER ONLY)
BASO#: 0 10*3/uL (ref 0.0–0.2)
BASO%: 0.3 % (ref 0.0–2.0)
Eosinophils Absolute: 0.2 10*3/uL (ref 0.0–0.5)
HGB: 8.6 g/dL — ABNORMAL LOW (ref 13.0–17.1)
LYMPH%: 32 % (ref 14.0–48.0)
MCV: 106 fL — ABNORMAL HIGH (ref 82–98)
MONO#: 0.3 10*3/uL (ref 0.1–0.9)
NEUT#: 2 10*3/uL (ref 1.5–6.5)
Platelets: 409 10*3/uL — ABNORMAL HIGH (ref 145–400)
RDW: 14.5 % (ref 11.1–15.7)
WBC: 3.7 10*3/uL — ABNORMAL LOW (ref 4.0–10.0)

## 2013-10-14 MED ORDER — DARBEPOETIN ALFA-POLYSORBATE 300 MCG/0.6ML IJ SOLN
INTRAMUSCULAR | Status: AC
  Filled 2013-10-14: qty 0.6

## 2013-10-14 MED ORDER — DARBEPOETIN ALFA-POLYSORBATE 300 MCG/0.6ML IJ SOLN
300.0000 ug | INTRAMUSCULAR | Status: DC
  Administered 2013-10-14: 300 ug via SUBCUTANEOUS

## 2013-10-14 NOTE — Patient Instructions (Signed)
Darbepoetin Alfa injection What is this medicine? DARBEPOETIN ALFA (dar be POE e tin AL fa) helps your body make more red blood cells. It is used to treat anemia caused by chronic kidney failure and chemotherapy. This medicine may be used for other purposes; ask your health care provider or pharmacist if you have questions. COMMON BRAND NAME(S): Aranesp What should I tell my health care provider before I take this medicine? They need to know if you have any of these conditions: -blood clotting disorders or history of blood clots -cancer patient not on chemotherapy -cystic fibrosis -heart disease, such as angina, heart failure, or a history of a heart attack -hemoglobin level of 12 g/dL or greater -high blood pressure -low levels of folate, iron, or vitamin B12 -seizures -an unusual or allergic reaction to darbepoetin, erythropoietin, albumin, hamster proteins, latex, other medicines, foods, dyes, or preservatives -pregnant or trying to get pregnant -breast-feeding How should I use this medicine? This medicine is for injection into a vein or under the skin. It is usually given by a health care professional in a hospital or clinic setting. If you get this medicine at home, you will be taught how to prepare and give this medicine. Do not shake the solution before you withdraw a dose. Use exactly as directed. Take your medicine at regular intervals. Do not take your medicine more often than directed. It is important that you put your used needles and syringes in a special sharps container. Do not put them in a trash can. If you do not have a sharps container, call your pharmacist or healthcare provider to get one. Talk to your pediatrician regarding the use of this medicine in children. While this medicine may be used in children as young as 1 year for selected conditions, precautions do apply. Overdosage: If you think you have taken too much of this medicine contact a poison control center or  emergency room at once. NOTE: This medicine is only for you. Do not share this medicine with others. What if I miss a dose? If you miss a dose, take it as soon as you can. If it is almost time for your next dose, take only that dose. Do not take double or extra doses. What may interact with this medicine? Do not take this medicine with any of the following medications: -epoetin alfa This list may not describe all possible interactions. Give your health care provider a list of all the medicines, herbs, non-prescription drugs, or dietary supplements you use. Also tell them if you smoke, drink alcohol, or use illegal drugs. Some items may interact with your medicine. What should I watch for while using this medicine? Visit your prescriber or health care professional for regular checks on your progress and for the needed blood tests and blood pressure measurements. It is especially important for the doctor to make sure your hemoglobin level is in the desired range, to limit the risk of potential side effects and to give you the best benefit. Keep all appointments for any recommended tests. Check your blood pressure as directed. Ask your doctor what your blood pressure should be and when you should contact him or her. As your body makes more red blood cells, you may need to take iron, folic acid, or vitamin B supplements. Ask your doctor or health care provider which products are right for you. If you have kidney disease continue dietary restrictions, even though this medication can make you feel better. Talk with your doctor or health   care professional about the foods you eat and the vitamins that you take. What side effects may I notice from receiving this medicine? Side effects that you should report to your doctor or health care professional as soon as possible: -allergic reactions like skin rash, itching or hives, swelling of the face, lips, or tongue -breathing problems -changes in vision -chest  pain -confusion, trouble speaking or understanding -feeling faint or lightheaded, falls -high blood pressure -muscle aches or pains -pain, swelling, warmth in the leg -rapid weight gain -severe headaches -sudden numbness or weakness of the face, arm or leg -trouble walking, dizziness, loss of balance or coordination -seizures (convulsions) -swelling of the ankles, feet, hands -unusually weak or tired Side effects that usually do not require medical attention (report to your doctor or health care professional if they continue or are bothersome): -diarrhea -fever, chills (flu-like symptoms) -headaches -nausea, vomiting -redness, stinging, or swelling at site where injected This list may not describe all possible side effects. Call your doctor for medical advice about side effects. You may report side effects to FDA at 1-800-FDA-1088. Where should I keep my medicine? Keep out of the reach of children. Store in a refrigerator between 2 and 8 degrees C (36 and 46 degrees F). Do not freeze. Do not shake. Throw away any unused portion if using a single-dose vial. Throw away any unused medicine after the expiration date. NOTE: This sheet is a summary. It may not cover all possible information. If you have questions about this medicine, talk to your doctor, pharmacist, or health care provider.  2014, Elsevier/Gold Standard. (2008-10-19 10:23:57)  

## 2013-10-16 ENCOUNTER — Ambulatory Visit: Payer: Medicare Other

## 2013-10-16 ENCOUNTER — Other Ambulatory Visit: Payer: Medicare Other | Admitting: Lab

## 2013-11-13 ENCOUNTER — Ambulatory Visit: Payer: Medicare Other

## 2013-11-13 ENCOUNTER — Other Ambulatory Visit (HOSPITAL_BASED_OUTPATIENT_CLINIC_OR_DEPARTMENT_OTHER): Payer: Medicare Other

## 2013-11-13 ENCOUNTER — Other Ambulatory Visit: Payer: Medicare Other | Admitting: Lab

## 2013-11-13 VITALS — BP 148/65 | HR 83 | Temp 94.8°F | Resp 20

## 2013-11-13 DIAGNOSIS — D649 Anemia, unspecified: Secondary | ICD-10-CM | POA: Diagnosis not present

## 2013-11-13 DIAGNOSIS — D469 Myelodysplastic syndrome, unspecified: Secondary | ICD-10-CM

## 2013-11-13 DIAGNOSIS — D631 Anemia in chronic kidney disease: Secondary | ICD-10-CM

## 2013-11-13 DIAGNOSIS — N259 Disorder resulting from impaired renal tubular function, unspecified: Secondary | ICD-10-CM

## 2013-11-13 LAB — CBC WITH DIFFERENTIAL (CANCER CENTER ONLY)
BASO%: 0.2 % (ref 0.0–2.0)
EOS%: 2.1 % (ref 0.0–7.0)
HCT: 27.9 % — ABNORMAL LOW (ref 38.7–49.9)
LYMPH#: 1.2 10*3/uL (ref 0.9–3.3)
MCHC: 33 g/dL (ref 32.0–35.9)
MCV: 108 fL — ABNORMAL HIGH (ref 82–98)
MONO#: 0.3 10*3/uL (ref 0.1–0.9)
NEUT%: 64.8 % (ref 40.0–80.0)
Platelets: 397 10*3/uL (ref 145–400)
RBC: 2.58 10*6/uL — ABNORMAL LOW (ref 4.20–5.70)
RDW: 15.2 % (ref 11.1–15.7)
WBC: 4.8 10*3/uL (ref 4.0–10.0)

## 2013-11-13 MED ORDER — DARBEPOETIN ALFA-POLYSORBATE 300 MCG/0.6ML IJ SOLN
INTRAMUSCULAR | Status: AC
  Filled 2013-11-13: qty 0.6

## 2013-11-13 MED ORDER — DARBEPOETIN ALFA-POLYSORBATE 300 MCG/0.6ML IJ SOLN: 300.0000 ug | INTRAMUSCULAR | Status: DC

## 2013-12-04 ENCOUNTER — Ambulatory Visit: Payer: Medicare Other | Admitting: Internal Medicine

## 2013-12-09 DIAGNOSIS — R351 Nocturia: Secondary | ICD-10-CM | POA: Diagnosis not present

## 2013-12-09 DIAGNOSIS — R350 Frequency of micturition: Secondary | ICD-10-CM | POA: Diagnosis not present

## 2013-12-15 ENCOUNTER — Other Ambulatory Visit: Payer: Self-pay | Admitting: *Deleted

## 2013-12-15 MED ORDER — POTASSIUM CHLORIDE CRYS ER 20 MEQ PO TBCR: 20.0000 meq | EXTENDED_RELEASE_TABLET | Freq: Every day | ORAL | Status: DC

## 2013-12-18 ENCOUNTER — Other Ambulatory Visit (HOSPITAL_BASED_OUTPATIENT_CLINIC_OR_DEPARTMENT_OTHER): Payer: Medicare Other | Admitting: Lab

## 2013-12-18 ENCOUNTER — Ambulatory Visit: Payer: Medicare Other

## 2013-12-18 ENCOUNTER — Ambulatory Visit (HOSPITAL_BASED_OUTPATIENT_CLINIC_OR_DEPARTMENT_OTHER): Payer: Medicare Other | Admitting: Hematology & Oncology

## 2013-12-18 ENCOUNTER — Encounter: Payer: Self-pay | Admitting: Hematology & Oncology

## 2013-12-18 VITALS — BP 130/46 | HR 75 | Temp 97.5°F | Resp 18 | Ht 68.0 in | Wt 158.0 lb

## 2013-12-18 DIAGNOSIS — D469 Myelodysplastic syndrome, unspecified: Secondary | ICD-10-CM

## 2013-12-18 DIAGNOSIS — D510 Vitamin B12 deficiency anemia due to intrinsic factor deficiency: Secondary | ICD-10-CM | POA: Diagnosis not present

## 2013-12-18 DIAGNOSIS — N289 Disorder of kidney and ureter, unspecified: Secondary | ICD-10-CM | POA: Diagnosis not present

## 2013-12-18 DIAGNOSIS — D649 Anemia, unspecified: Secondary | ICD-10-CM

## 2013-12-18 DIAGNOSIS — D518 Other vitamin B12 deficiency anemias: Secondary | ICD-10-CM

## 2013-12-18 LAB — CBC WITH DIFFERENTIAL (CANCER CENTER ONLY)
BASO#: 0 10*3/uL (ref 0.0–0.2)
BASO%: 0.2 % (ref 0.0–2.0)
EOS%: 2.5 % (ref 0.0–7.0)
Eosinophils Absolute: 0.1 10*3/uL (ref 0.0–0.5)
HCT: 26.7 % — ABNORMAL LOW (ref 38.7–49.9)
HGB: 8.8 g/dL — ABNORMAL LOW (ref 13.0–17.1)
LYMPH#: 1.2 10*3/uL (ref 0.9–3.3)
LYMPH%: 28.9 % (ref 14.0–48.0)
MCH: 35.1 pg — AB (ref 28.0–33.4)
MCHC: 33 g/dL (ref 32.0–35.9)
MCV: 106 fL — AB (ref 82–98)
MONO#: 0.3 10*3/uL (ref 0.1–0.9)
MONO%: 8.1 % (ref 0.0–13.0)
NEUT%: 60.3 % (ref 40.0–80.0)
NEUTROS ABS: 2.5 10*3/uL (ref 1.5–6.5)
PLATELETS: 373 10*3/uL (ref 145–400)
RBC: 2.51 10*6/uL — ABNORMAL LOW (ref 4.20–5.70)
RDW: 14.4 % (ref 11.1–15.7)
WBC: 4.1 10*3/uL (ref 4.0–10.0)

## 2013-12-18 LAB — IRON AND TIBC CHCC
%SAT: UNDETERMINED % (ref 20–?)
Iron: 228 ug/dL — ABNORMAL HIGH (ref 42–163)
TIBC: 214 ug/dL (ref 202–409)
UIBC: UNDETERMINED ug/dL (ref 117–376)

## 2013-12-18 LAB — FERRITIN CHCC: FERRITIN: 589 ng/mL — AB (ref 22–316)

## 2013-12-18 LAB — RETICULOCYTES (CHCC)
ABS Retic: 23.4 10*3/uL (ref 19.0–186.0)
RBC.: 2.6 MIL/uL — AB (ref 4.22–5.81)
RETIC CT PCT: 0.9 % (ref 0.4–2.3)

## 2013-12-18 LAB — CHCC SATELLITE - SMEAR

## 2013-12-18 NOTE — Progress Notes (Signed)
This office note has been dictated.

## 2013-12-18 NOTE — Progress Notes (Signed)
No injection today per dr. ennever 

## 2013-12-19 NOTE — Progress Notes (Signed)
DIAGNOSES: 1. Anemia of renal insufficiency. 2. Refractory anemia. 3. Pernicious anemia.  CURRENT THERAPY: 1. Aranesp 300 mcg subcu as needed for hemoglobin less than 9-10. 2. Vitamin B12 1 mg IM q. month.  INTERIM HISTORY:  Alexander Rice comes in for his followup.  He is doing fairly well.  He still has the back problems.  He last got the Aranesp back in November.  He has done pretty well since then.  He has had no problems with fever, sweats, or chills.  His appetite has been doing pretty good.  He had a good Christmas holiday.  He has had no cough.  He has had no change in bowel or bladder habits. He has had some chronic leg swelling.  He has some compression stockings on.  PHYSICAL EXAMINATION:  General:  This is an elderly white gentleman, in no obvious distress.  Vital Signs:  Temperature of 97.5, pulse 75, respiratory rate 18, blood pressure 130/46.  Weight is 158 pounds.  Head and Neck:  Normocephalic, atraumatic skull.  There are no ocular or oral lesions.  There are no palpable, cervical, or supraclavicular lymph nodes.  Lungs:  Clear bilaterally.  Cardiac:  Regular rate and rhythm with a normal S1 and S2.  There are no murmurs, rubs, or bruits. Abdomen:  Soft.  He has good bowel sounds.  There is no fluid wave. There is no palpable abdominal mass.  There is no palpable hepatosplenomegaly.  Back:  Shows some kyphosis.  No liver scoliosis. Extremities:  Shows compression stockings on his lower legs.  He has good strength in his legs.  He does have osteoarthritic changes in his joints.  Skin:  No rashes, ecchymosis, or petechia.  LABORATORY STUDIES:  White cell count 4.1, hemoglobin 8.8, hematocrit 26.7, platelet count 373.  MCV is 106.  IMPRESSION:  Alexander Rice is a 80 year old gentleman.  He has multifactorial anemia.  For now, we do not need to give him any Aranesp.  He is asymptomatic.  We will get him back in 1 month.  I am sure at that point, we  probably will need to give him Aranesp.  He gets his vitamin B12 at his family doctor's office.    ______________________________ [name], M.D. PRE/MEDQ  D:  12/18/2013  T:  12/19/2013  Job:  [id number]

## 2013-12-24 DIAGNOSIS — B351 Tinea unguium: Secondary | ICD-10-CM | POA: Diagnosis not present

## 2013-12-24 DIAGNOSIS — M79609 Pain in unspecified limb: Secondary | ICD-10-CM | POA: Diagnosis not present

## 2014-01-15 ENCOUNTER — Ambulatory Visit: Payer: Medicare Other | Admitting: Hematology & Oncology

## 2014-01-15 ENCOUNTER — Other Ambulatory Visit: Payer: Medicare Other | Admitting: Lab

## 2014-01-15 ENCOUNTER — Ambulatory Visit: Payer: Medicare Other

## 2014-01-15 ENCOUNTER — Telehealth: Payer: Self-pay | Admitting: Hematology & Oncology

## 2014-01-15 NOTE — Telephone Encounter (Signed)
2-27 to 3-16

## 2014-01-18 ENCOUNTER — Other Ambulatory Visit (HOSPITAL_BASED_OUTPATIENT_CLINIC_OR_DEPARTMENT_OTHER): Payer: Medicare Other | Admitting: Lab

## 2014-01-18 ENCOUNTER — Ambulatory Visit (HOSPITAL_BASED_OUTPATIENT_CLINIC_OR_DEPARTMENT_OTHER): Payer: Medicare Other

## 2014-01-18 ENCOUNTER — Encounter: Payer: Self-pay | Admitting: Hematology & Oncology

## 2014-01-18 ENCOUNTER — Ambulatory Visit (HOSPITAL_BASED_OUTPATIENT_CLINIC_OR_DEPARTMENT_OTHER): Payer: Medicare Other | Admitting: Hematology & Oncology

## 2014-01-18 VITALS — BP 136/53 | HR 74 | Temp 97.9°F | Resp 16 | Ht 67.0 in | Wt 172.0 lb

## 2014-01-18 DIAGNOSIS — D649 Anemia, unspecified: Secondary | ICD-10-CM

## 2014-01-18 DIAGNOSIS — D510 Vitamin B12 deficiency anemia due to intrinsic factor deficiency: Secondary | ICD-10-CM | POA: Diagnosis not present

## 2014-01-18 DIAGNOSIS — D4620 Refractory anemia with excess of blasts, unspecified: Secondary | ICD-10-CM

## 2014-01-18 DIAGNOSIS — D469 Myelodysplastic syndrome, unspecified: Secondary | ICD-10-CM

## 2014-01-18 DIAGNOSIS — D518 Other vitamin B12 deficiency anemias: Secondary | ICD-10-CM

## 2014-01-18 DIAGNOSIS — N259 Disorder resulting from impaired renal tubular function, unspecified: Secondary | ICD-10-CM

## 2014-01-18 DIAGNOSIS — D631 Anemia in chronic kidney disease: Secondary | ICD-10-CM

## 2014-01-18 DIAGNOSIS — N189 Chronic kidney disease, unspecified: Secondary | ICD-10-CM

## 2014-01-18 LAB — CBC WITH DIFFERENTIAL (CANCER CENTER ONLY)
BASO#: 0 10*3/uL (ref 0.0–0.2)
BASO%: 0.2 % (ref 0.0–2.0)
EOS%: 1.9 % (ref 0.0–7.0)
Eosinophils Absolute: 0.1 10*3/uL (ref 0.0–0.5)
HCT: 25.9 % — ABNORMAL LOW (ref 38.7–49.9)
HGB: 8.4 g/dL — ABNORMAL LOW (ref 13.0–17.1)
LYMPH#: 1.1 10*3/uL (ref 0.9–3.3)
LYMPH%: 21 % (ref 14.0–48.0)
MCH: 34.9 pg — AB (ref 28.0–33.4)
MCHC: 32.4 g/dL (ref 32.0–35.9)
MCV: 108 fL — AB (ref 82–98)
MONO#: 0.4 10*3/uL (ref 0.1–0.9)
MONO%: 8.4 % (ref 0.0–13.0)
NEUT#: 3.6 10*3/uL (ref 1.5–6.5)
NEUT%: 68.5 % (ref 40.0–80.0)
Platelets: 385 10*3/uL (ref 145–400)
RBC: 2.41 10*6/uL — ABNORMAL LOW (ref 4.20–5.70)
RDW: 14.7 % (ref 11.1–15.7)
WBC: 5.2 10*3/uL (ref 4.0–10.0)

## 2014-01-18 MED ORDER — DARBEPOETIN ALFA-POLYSORBATE 300 MCG/0.6ML IJ SOLN
INTRAMUSCULAR | Status: AC
  Filled 2014-01-18: qty 0.6

## 2014-01-18 MED ORDER — DARBEPOETIN ALFA-POLYSORBATE 300 MCG/0.6ML IJ SOLN
300.0000 ug | INTRAMUSCULAR | Status: AC
  Administered 2014-01-18: 300 ug via SUBCUTANEOUS

## 2014-01-18 NOTE — Patient Instructions (Signed)
Darbepoetin Alfa injection What is this medicine? DARBEPOETIN ALFA (dar be POE e tin AL fa) helps your body make more red blood cells. It is used to treat anemia caused by chronic kidney failure and chemotherapy. This medicine may be used for other purposes; ask your health care provider or pharmacist if you have questions. COMMON BRAND NAME(S): Aranesp What should I tell my health care provider before I take this medicine? They need to know if you have any of these conditions: -blood clotting disorders or history of blood clots -cancer patient not on chemotherapy -cystic fibrosis -heart disease, such as angina, heart failure, or a history of a heart attack -hemoglobin level of 12 g/dL or greater -high blood pressure -low levels of folate, iron, or vitamin B12 -seizures -an unusual or allergic reaction to darbepoetin, erythropoietin, albumin, hamster proteins, latex, other medicines, foods, dyes, or preservatives -pregnant or trying to get pregnant -breast-feeding How should I use this medicine? This medicine is for injection into a vein or under the skin. It is usually given by a health care professional in a hospital or clinic setting. If you get this medicine at home, you will be taught how to prepare and give this medicine. Do not shake the solution before you withdraw a dose. Use exactly as directed. Take your medicine at regular intervals. Do not take your medicine more often than directed. It is important that you put your used needles and syringes in a special sharps container. Do not put them in a trash can. If you do not have a sharps container, call your pharmacist or healthcare provider to get one. Talk to your pediatrician regarding the use of this medicine in children. While this medicine may be used in children as young as 1 year for selected conditions, precautions do apply. Overdosage: If you think you have taken too much of this medicine contact a poison control center or  emergency room at once. NOTE: This medicine is only for you. Do not share this medicine with others. What if I miss a dose? If you miss a dose, take it as soon as you can. If it is almost time for your next dose, take only that dose. Do not take double or extra doses. What may interact with this medicine? Do not take this medicine with any of the following medications: -epoetin alfa This list may not describe all possible interactions. Give your health care provider a list of all the medicines, herbs, non-prescription drugs, or dietary supplements you use. Also tell them if you smoke, drink alcohol, or use illegal drugs. Some items may interact with your medicine. What should I watch for while using this medicine? Visit your prescriber or health care professional for regular checks on your progress and for the needed blood tests and blood pressure measurements. It is especially important for the doctor to make sure your hemoglobin level is in the desired range, to limit the risk of potential side effects and to give you the best benefit. Keep all appointments for any recommended tests. Check your blood pressure as directed. Ask your doctor what your blood pressure should be and when you should contact him or her. As your body makes more red blood cells, you may need to take iron, folic acid, or vitamin B supplements. Ask your doctor or health care provider which products are right for you. If you have kidney disease continue dietary restrictions, even though this medication can make you feel better. Talk with your doctor or health   care professional about the foods you eat and the vitamins that you take. What side effects may I notice from receiving this medicine? Side effects that you should report to your doctor or health care professional as soon as possible: -allergic reactions like skin rash, itching or hives, swelling of the face, lips, or tongue -breathing problems -changes in vision -chest  pain -confusion, trouble speaking or understanding -feeling faint or lightheaded, falls -high blood pressure -muscle aches or pains -pain, swelling, warmth in the leg -rapid weight gain -severe headaches -sudden numbness or weakness of the face, arm or leg -trouble walking, dizziness, loss of balance or coordination -seizures (convulsions) -swelling of the ankles, feet, hands -unusually weak or tired Side effects that usually do not require medical attention (report to your doctor or health care professional if they continue or are bothersome): -diarrhea -fever, chills (flu-like symptoms) -headaches -nausea, vomiting -redness, stinging, or swelling at site where injected This list may not describe all possible side effects. Call your doctor for medical advice about side effects. You may report side effects to FDA at 1-800-FDA-1088. Where should I keep my medicine? Keep out of the reach of children. Store in a refrigerator between 2 and 8 degrees C (36 and 46 degrees F). Do not freeze. Do not shake. Throw away any unused portion if using a single-dose vial. Throw away any unused medicine after the expiration date. NOTE: This sheet is a summary. It may not cover all possible information. If you have questions about this medicine, talk to your doctor, pharmacist, or health care provider.  2014, Elsevier/Gold Standard. (2008-10-19 10:23:57)  

## 2014-01-18 NOTE — Progress Notes (Signed)
  DIAGNOSIS:  Anemia secondary to renal insufficiency  Refractory anemia  Pernicious anemia   CURRENT THERAPY:  Aranesp 300 mcg subcutaneous as needed for hemoglobin less than 9  Vitamin B12 1 mg IM every month-patient does at home   INTERIM HISTORY:  Alexander Rice comes in for followup. We last saw in January. He's been doing okay. He has terrible arthritis. He has kyphosis. He always has a lot of low back pain. He does seem to manage with this.  When we last saw him, his ferritin was 589.    He's had no bleeding.    Of note, his reticulocyte count will we see him when corrected is usually less than 0.5. This is highly indicative of his marrow not functioning well.    He's had no bleeding.     PHYSICAL EXAMINATION:  Elderly white gentleman who is clearly kyphotic. Vital signs are temperature of 97.9. Pulse 84. Blood pressure 136/53. Weight is 172 pounds. Head exam shows no ocular or oral lesions. There is no palpable cervical or supraclavicular this. Lungs are clear. Cardiac exam regular in rhythm with an occasional extra beat. He is a 1/6 systolic ejection murmur. Abdomen is soft. There is no fluid wave. There is no palpable liver or spleen. Back exam shows marked kyphosis. Extremities shows age-related changes his joints. He has some symmetric muscle atrophy. He has decent strength. Skin exam is without rashes.   LABORATORY STUDIES:  White cell count 5.2. Hemoglobin 8.4. Hematocrit 25.9. Platelet count 385. MCV is 108. S2    IMPRESSION:  Alexander Rice is a 80 year old gentleman.with multifactorial anemia. I think his biggest problem is his bone marrow does not work well. The low reticulocyte count clearly shows his marrow not functioning. I really don't see a need for a bone marrow test on him. This would not change our management plans on him. He really is not a candidate for anything aggressive.    We will plan to get him back in one more month. He did get his  Aranesp today.   [name], MD 01/18/2014

## 2014-01-19 LAB — IRON AND TIBC CHCC
%SAT: 49 % (ref 20–55)
IRON: 101 ug/dL (ref 42–163)
TIBC: 206 ug/dL (ref 202–409)
UIBC: 106 ug/dL — ABNORMAL LOW (ref 117–376)

## 2014-01-19 LAB — FERRITIN CHCC: Ferritin: 597 ng/ml — ABNORMAL HIGH (ref 22–316)

## 2014-01-21 ENCOUNTER — Telehealth: Payer: Self-pay | Admitting: *Deleted

## 2014-01-21 ENCOUNTER — Other Ambulatory Visit: Payer: Self-pay | Admitting: *Deleted

## 2014-01-21 MED ORDER — DOXAZOSIN MESYLATE 8 MG PO TABS: 8.0000 mg | ORAL_TABLET | Freq: Every day | ORAL | Status: DC

## 2014-01-21 NOTE — Telephone Encounter (Signed)
Pt called complaining of chills, runny nose.  No muscle aches or fever.  Pt did not have flu shot.  Appt scheduled with Dr Sarajane Jews tomorrow at 11:15

## 2014-01-22 ENCOUNTER — Ambulatory Visit (INDEPENDENT_AMBULATORY_CARE_PROVIDER_SITE_OTHER): Payer: Medicare Other | Admitting: Family Medicine

## 2014-01-22 ENCOUNTER — Encounter: Payer: Self-pay | Admitting: Family Medicine

## 2014-01-22 VITALS — BP 128/62 | HR 91 | Temp 98.2°F | Wt 170.0 lb

## 2014-01-22 DIAGNOSIS — J019 Acute sinusitis, unspecified: Secondary | ICD-10-CM | POA: Diagnosis not present

## 2014-01-22 DIAGNOSIS — E538 Deficiency of other specified B group vitamins: Secondary | ICD-10-CM

## 2014-01-22 LAB — RETICULOCYTES (CHCC)
ABS RETIC: 32 10*3/uL (ref 19.0–186.0)
RBC.: 2.46 MIL/uL — ABNORMAL LOW (ref 4.22–5.81)
Retic Ct Pct: 1.3 % (ref 0.4–2.3)

## 2014-01-22 LAB — TRANSFERRIN RECEPTOR, SOLUABLE: TRANSFERRIN RECEPTOR, SOLUBLE: 1.81 mg/L — AB (ref 0.76–1.76)

## 2014-01-22 MED ORDER — AZITHROMYCIN 250 MG PO TABS: ORAL_TABLET | ORAL | Status: DC

## 2014-01-22 MED ORDER — CYANOCOBALAMIN 1000 MCG/ML IJ SOLN
1000.0000 ug | Freq: Once | INTRAMUSCULAR | Status: AC
  Administered 2014-01-22: 1000 ug via INTRAMUSCULAR

## 2014-01-22 NOTE — Progress Notes (Signed)
Pre visit review using our clinic review tool, if applicable. No additional management support is needed unless otherwise documented below in the visit note. 

## 2014-01-22 NOTE — Progress Notes (Signed)
   Subjective:    Patient ID: Alexander Rice, male    DOB: 08/06/1932, 80 y.o.   MRN: [id number]  HPI Here for 4 days of sinus pressure, PND , and dry cough. No fever.    Review of Systems  Constitutional: Negative.   HENT: Positive for congestion, postnasal drip and sinus pressure.   Eyes: Negative.   Respiratory: Positive for cough.        Objective:   Physical Exam  Constitutional: He appears well-developed and well-nourished.  HENT:  Right Ear: External ear normal.  Left Ear: External ear normal.  Nose: Nose normal.  Mouth/Throat: Oropharynx is clear and moist.  Eyes: Conjunctivae are normal.  Pulmonary/Chest: Effort normal and breath sounds normal.  Lymphadenopathy:    He has no cervical adenopathy.          Assessment & Plan:  Add Mucinex

## 2014-02-01 ENCOUNTER — Other Ambulatory Visit: Payer: Medicare Other | Admitting: Lab

## 2014-02-01 ENCOUNTER — Ambulatory Visit: Payer: Medicare Other | Admitting: Hematology & Oncology

## 2014-02-01 ENCOUNTER — Ambulatory Visit: Payer: Medicare Other

## 2014-02-17 ENCOUNTER — Ambulatory Visit (HOSPITAL_BASED_OUTPATIENT_CLINIC_OR_DEPARTMENT_OTHER): Payer: Medicare Other | Admitting: Hematology & Oncology

## 2014-02-17 ENCOUNTER — Ambulatory Visit (HOSPITAL_BASED_OUTPATIENT_CLINIC_OR_DEPARTMENT_OTHER): Payer: Medicare Other

## 2014-02-17 ENCOUNTER — Other Ambulatory Visit (HOSPITAL_BASED_OUTPATIENT_CLINIC_OR_DEPARTMENT_OTHER): Payer: Medicare Other | Admitting: Lab

## 2014-02-17 VITALS — BP 151/59 | HR 69 | Temp 97.4°F | Resp 16 | Wt 165.0 lb

## 2014-02-17 DIAGNOSIS — D4620 Refractory anemia with excess of blasts, unspecified: Secondary | ICD-10-CM

## 2014-02-17 DIAGNOSIS — D469 Myelodysplastic syndrome, unspecified: Secondary | ICD-10-CM

## 2014-02-17 DIAGNOSIS — N039 Chronic nephritic syndrome with unspecified morphologic changes: Secondary | ICD-10-CM | POA: Diagnosis not present

## 2014-02-17 DIAGNOSIS — N189 Chronic kidney disease, unspecified: Secondary | ICD-10-CM

## 2014-02-17 DIAGNOSIS — D510 Vitamin B12 deficiency anemia due to intrinsic factor deficiency: Secondary | ICD-10-CM

## 2014-02-17 DIAGNOSIS — N259 Disorder resulting from impaired renal tubular function, unspecified: Secondary | ICD-10-CM

## 2014-02-17 DIAGNOSIS — D631 Anemia in chronic kidney disease: Secondary | ICD-10-CM

## 2014-02-17 LAB — CBC WITH DIFFERENTIAL (CANCER CENTER ONLY)
BASO#: 0 10*3/uL (ref 0.0–0.2)
BASO%: 0.3 % (ref 0.0–2.0)
EOS%: 2.4 % (ref 0.0–7.0)
Eosinophils Absolute: 0.1 10*3/uL (ref 0.0–0.5)
HEMATOCRIT: 24.9 % — AB (ref 38.7–49.9)
HGB: 8.1 g/dL — ABNORMAL LOW (ref 13.0–17.1)
LYMPH#: 1.2 10*3/uL (ref 0.9–3.3)
LYMPH%: 39.3 % (ref 14.0–48.0)
MCH: 34.9 pg — ABNORMAL HIGH (ref 28.0–33.4)
MCHC: 32.5 g/dL (ref 32.0–35.9)
MCV: 107 fL — ABNORMAL HIGH (ref 82–98)
MONO#: 0.2 10*3/uL (ref 0.1–0.9)
MONO%: 8.1 % (ref 0.0–13.0)
NEUT#: 1.5 10*3/uL (ref 1.5–6.5)
NEUT%: 49.9 % (ref 40.0–80.0)
PLATELETS: 357 10*3/uL (ref 145–400)
RBC: 2.32 10*6/uL — ABNORMAL LOW (ref 4.20–5.70)
RDW: 15.6 % (ref 11.1–15.7)
WBC: 3 10*3/uL — ABNORMAL LOW (ref 4.0–10.0)

## 2014-02-17 LAB — RETICULOCYTES (CHCC)
ABS Retic: 19.4 10*3/uL (ref 19.0–186.0)
RBC.: 2.43 MIL/uL — ABNORMAL LOW (ref 4.22–5.81)
Retic Ct Pct: 0.8 % (ref 0.4–2.3)

## 2014-02-17 LAB — CHCC SATELLITE - SMEAR

## 2014-02-17 MED ORDER — DARBEPOETIN ALFA-POLYSORBATE 300 MCG/0.6ML IJ SOLN
INTRAMUSCULAR | Status: AC
  Filled 2014-02-17: qty 0.6

## 2014-02-17 MED ORDER — DARBEPOETIN ALFA-POLYSORBATE 300 MCG/0.6ML IJ SOLN
300.0000 ug | INTRAMUSCULAR | Status: DC
  Administered 2014-02-17: 300 ug via SUBCUTANEOUS

## 2014-02-17 NOTE — Patient Instructions (Signed)
Darbepoetin Alfa injection What is this medicine? DARBEPOETIN ALFA (dar be POE e tin AL fa) helps your body make more red blood cells. It is used to treat anemia caused by chronic kidney failure and chemotherapy. This medicine may be used for other purposes; ask your health care provider or pharmacist if you have questions. COMMON BRAND NAME(S): Aranesp What should I tell my health care provider before I take this medicine? They need to know if you have any of these conditions: -blood clotting disorders or history of blood clots -cancer patient not on chemotherapy -cystic fibrosis -heart disease, such as angina, heart failure, or a history of a heart attack -hemoglobin level of 12 g/dL or greater -high blood pressure -low levels of folate, iron, or vitamin B12 -seizures -an unusual or allergic reaction to darbepoetin, erythropoietin, albumin, hamster proteins, latex, other medicines, foods, dyes, or preservatives -pregnant or trying to get pregnant -breast-feeding How should I use this medicine? This medicine is for injection into a vein or under the skin. It is usually given by a health care professional in a hospital or clinic setting. If you get this medicine at home, you will be taught how to prepare and give this medicine. Do not shake the solution before you withdraw a dose. Use exactly as directed. Take your medicine at regular intervals. Do not take your medicine more often than directed. It is important that you put your used needles and syringes in a special sharps container. Do not put them in a trash can. If you do not have a sharps container, call your pharmacist or healthcare provider to get one. Talk to your pediatrician regarding the use of this medicine in children. While this medicine may be used in children as young as 1 year for selected conditions, precautions do apply. Overdosage: If you think you have taken too much of this medicine contact a poison control center or  emergency room at once. NOTE: This medicine is only for you. Do not share this medicine with others. What if I miss a dose? If you miss a dose, take it as soon as you can. If it is almost time for your next dose, take only that dose. Do not take double or extra doses. What may interact with this medicine? Do not take this medicine with any of the following medications: -epoetin alfa This list may not describe all possible interactions. Give your health care provider a list of all the medicines, herbs, non-prescription drugs, or dietary supplements you use. Also tell them if you smoke, drink alcohol, or use illegal drugs. Some items may interact with your medicine. What should I watch for while using this medicine? Visit your prescriber or health care professional for regular checks on your progress and for the needed blood tests and blood pressure measurements. It is especially important for the doctor to make sure your hemoglobin level is in the desired range, to limit the risk of potential side effects and to give you the best benefit. Keep all appointments for any recommended tests. Check your blood pressure as directed. Ask your doctor what your blood pressure should be and when you should contact him or her. As your body makes more red blood cells, you may need to take iron, folic acid, or vitamin B supplements. Ask your doctor or health care provider which products are right for you. If you have kidney disease continue dietary restrictions, even though this medication can make you feel better. Talk with your doctor or health   care professional about the foods you eat and the vitamins that you take. What side effects may I notice from receiving this medicine? Side effects that you should report to your doctor or health care professional as soon as possible: -allergic reactions like skin rash, itching or hives, swelling of the face, lips, or tongue -breathing problems -changes in vision -chest  pain -confusion, trouble speaking or understanding -feeling faint or lightheaded, falls -high blood pressure -muscle aches or pains -pain, swelling, warmth in the leg -rapid weight gain -severe headaches -sudden numbness or weakness of the face, arm or leg -trouble walking, dizziness, loss of balance or coordination -seizures (convulsions) -swelling of the ankles, feet, hands -unusually weak or tired Side effects that usually do not require medical attention (report to your doctor or health care professional if they continue or are bothersome): -diarrhea -fever, chills (flu-like symptoms) -headaches -nausea, vomiting -redness, stinging, or swelling at site where injected This list may not describe all possible side effects. Call your doctor for medical advice about side effects. You may report side effects to FDA at 1-800-FDA-1088. Where should I keep my medicine? Keep out of the reach of children. Store in a refrigerator between 2 and 8 degrees C (36 and 46 degrees F). Do not freeze. Do not shake. Throw away any unused portion if using a single-dose vial. Throw away any unused medicine after the expiration date. NOTE: This sheet is a summary. It may not cover all possible information. If you have questions about this medicine, talk to your doctor, pharmacist, or health care provider.  2014, Elsevier/Gold Standard. (2008-10-19 10:23:57)  

## 2014-02-18 LAB — IRON AND TIBC CHCC
%SAT: 97 % — AB (ref 20–55)
IRON: 184 ug/dL — AB (ref 42–163)
TIBC: 189 ug/dL — AB (ref 202–409)
UIBC: 5 ug/dL — ABNORMAL LOW (ref 117–376)

## 2014-02-18 LAB — FERRITIN CHCC: Ferritin: 402 ng/ml — ABNORMAL HIGH (ref 22–316)

## 2014-02-18 NOTE — Progress Notes (Signed)
Hematology and Oncology Follow Up Visit  Alexander Rice [id number] 18-Aug-1932 80 y.o. 02/18/2014   Principle Diagnosis:   Anemia of renal insufficiency  Refractory anemia-low-grade  Pernicious anemia  Current Therapy:    Aranesp 300 mcg subcutaneous as needed for him level less than 10  Vitamin B12 1 mg IM every month     Interim History:  Alexander Rice is back for followup. He feels fairly well. He still has the back issues.  When we last saw him, his hemoglobin was 8.4. He was doing well. His transferrin receptor was 141. Ferritin was 597 with an iron saturation of 49%. We did going him today Aranesp injection.  He has had no problems bleeding. He's had no nausea vomiting. He's had no leg swelling. He's had no cough or shortness of breath.  Medications: Current outpatient prescriptions:aspirin 81 MG tablet, Take 81 mg by mouth daily.  , Disp: , Rfl: ;  azithromycin (ZITHROMAX) 250 MG tablet, As directed, Disp: 6 tablet, Rfl: 0;  Cyanocobalamin (VITAMIN B 12 PO), Inject as directed every 30 (thirty) days., Disp: , Rfl: ;  dorzolamide (TRUSOPT) 2 % ophthalmic solution, Place 1 drop into both eyes 2 (two) times daily.  , Disp: , Rfl:  doxazosin (CARDURA) 8 MG tablet, Take 1 tablet (8 mg total) by mouth at bedtime., Disp: 90 tablet, Rfl: 3;  isosorbide mononitrate (IMDUR) 30 MG 24 hr tablet, Take 1 tablet (30 mg total) by mouth daily., Disp: 90 tablet, Rfl: 3;  LUMIGAN 0.01 % SOLN, Place 1 drop into both eyes daily., Disp: , Rfl: ;  Multiple Vitamin (MULTIVITAMIN) tablet, Take 1 tablet by mouth daily.  , Disp: , Rfl:  nitroGLYCERIN (NITROSTAT) 0.4 MG SL tablet, Place 0.4 mg under the tongue every 5 (five) minutes as needed.  , Disp: , Rfl: ;  omeprazole (PRILOSEC) 20 MG capsule, Take 1 capsule (20 mg total) by mouth daily., Disp: 90 capsule, Rfl: 3;  oxybutynin (DITROPAN) 5 MG tablet, Take 5 mg by mouth 2 (two) times daily. Patient states he takes once daily., Disp: , Rfl:  potassium  chloride SA (K-DUR,KLOR-CON) 20 MEQ tablet, Take 1 tablet (20 mEq total) by mouth daily., Disp: 90 tablet, Rfl: 3;  simvastatin (ZOCOR) 40 MG tablet, Take 1 tablet (40 mg total) by mouth at bedtime., Disp: 90 tablet, Rfl: 3 No current facility-administered medications for this visit. Facility-Administered Medications Ordered in Other Visits: darbepoetin (ARANESP) injection 300 mcg, 300 mcg, Subcutaneous, Once, [name], MD;  darbepoetin Kyra Searles) injection 300 mcg, 300 mcg, Subcutaneous, Q28 days, Helayne Seminole, PA-C, 300 mcg at 01/18/14 1135  Allergies: No Known Allergies  Past Medical History, Surgical history, Social history, and Family History were reviewed and updated.  Review of Systems: As above  Physical Exam:  weight is 165 lb (74.844 kg). His oral temperature is 97.4 F (36.3 C). His blood pressure is 151/59 and his pulse is 69. His respiration is 16.   Elderly gentleman. He has kyphosis. His lungs are clear. Cardiac exam regular in rhythm. Abdomen soft. There is no fluid wave. He has good bowel sounds. There is no palpable hepato- splenomegaly. Neck exam shows kyphosis. There is no tenderness over the spine. Extremities shows age-related osteophytic changes in his joints. He has a very lady muscle atrophy. He has decent strength. Skin exam shows scattered ecchymoses. Neurological exam is nonfocal.  Lab Results  Component Value Date   WBC 3.0* 02/17/2014   HGB 8.1* 02/17/2014   HCT 24.9* 02/17/2014  MCV 107* 02/17/2014   PLT 357 02/17/2014     Chemistry      Component Value Date/Time   NA 132* 07/21/2013 1010   K 4.3 07/21/2013 1010   CL 103 07/21/2013 1010   CO2 24 07/21/2013 1010   BUN 27* 07/21/2013 1010   CREATININE 1.3 07/21/2013 1010      Component Value Date/Time   CALCIUM 8.6 07/21/2013 1010   ALKPHOS 36* 07/21/2013 1010   AST 17 07/21/2013 1010   ALT 14 07/21/2013 1010   BILITOT 0.7 07/21/2013 1010       Ferritin is 402. Iron saturation 97%. Total iron 184. His reticulocyte  count, when corrected, is a 0.4  Impression and Plan: Alexander Rice is 80 year old gentleman. Has multifactorial anemia.  Am somewhat worried about the fact that he got Aranesp just 3 weeks ago and yet his blood count is worse. He is not that symptomatic.  I don't think he is iron deficient. I am not sure if there is any type of resistance to the Aranesp.  I will give him a dose today. He is getting ready to go down to [place] for the [employer] next week. I want him to go down and have a good time to not have problems.  I want to see him back in a couple weeks. We find that his hemoglobin is no better, and that we're going to have to consider a bone marrow biopsy on him.   [name], MD 4/2/20155:47 PM

## 2014-03-04 ENCOUNTER — Other Ambulatory Visit (HOSPITAL_BASED_OUTPATIENT_CLINIC_OR_DEPARTMENT_OTHER): Payer: Medicare Other | Admitting: Lab

## 2014-03-04 ENCOUNTER — Encounter: Payer: Self-pay | Admitting: Hematology & Oncology

## 2014-03-04 ENCOUNTER — Ambulatory Visit (HOSPITAL_BASED_OUTPATIENT_CLINIC_OR_DEPARTMENT_OTHER): Payer: Medicare Other | Admitting: Hematology & Oncology

## 2014-03-04 ENCOUNTER — Ambulatory Visit (HOSPITAL_BASED_OUTPATIENT_CLINIC_OR_DEPARTMENT_OTHER): Payer: Medicare Other

## 2014-03-04 VITALS — BP 124/48 | HR 70 | Temp 97.9°F | Resp 18 | Ht 68.0 in | Wt 170.0 lb

## 2014-03-04 DIAGNOSIS — N289 Disorder of kidney and ureter, unspecified: Secondary | ICD-10-CM

## 2014-03-04 DIAGNOSIS — D510 Vitamin B12 deficiency anemia due to intrinsic factor deficiency: Secondary | ICD-10-CM

## 2014-03-04 DIAGNOSIS — D649 Anemia, unspecified: Secondary | ICD-10-CM

## 2014-03-04 DIAGNOSIS — D631 Anemia in chronic kidney disease: Secondary | ICD-10-CM

## 2014-03-04 DIAGNOSIS — N259 Disorder resulting from impaired renal tubular function, unspecified: Secondary | ICD-10-CM

## 2014-03-04 DIAGNOSIS — D4620 Refractory anemia with excess of blasts, unspecified: Secondary | ICD-10-CM | POA: Diagnosis not present

## 2014-03-04 DIAGNOSIS — D469 Myelodysplastic syndrome, unspecified: Secondary | ICD-10-CM

## 2014-03-04 DIAGNOSIS — N189 Chronic kidney disease, unspecified: Secondary | ICD-10-CM

## 2014-03-04 LAB — CBC WITH DIFFERENTIAL (CANCER CENTER ONLY)
BASO#: 0 10*3/uL (ref 0.0–0.2)
BASO%: 0.2 % (ref 0.0–2.0)
EOS%: 2 % (ref 0.0–7.0)
Eosinophils Absolute: 0.1 10*3/uL (ref 0.0–0.5)
HCT: 26.2 % — ABNORMAL LOW (ref 38.7–49.9)
HGB: 8.6 g/dL — ABNORMAL LOW (ref 13.0–17.1)
LYMPH#: 1.4 10*3/uL (ref 0.9–3.3)
LYMPH%: 30.8 % (ref 14.0–48.0)
MCH: 36 pg — AB (ref 28.0–33.4)
MCHC: 32.8 g/dL (ref 32.0–35.9)
MCV: 110 fL — ABNORMAL HIGH (ref 82–98)
MONO#: 0.3 10*3/uL (ref 0.1–0.9)
MONO%: 7.4 % (ref 0.0–13.0)
NEUT%: 59.6 % (ref 40.0–80.0)
NEUTROS ABS: 2.7 10*3/uL (ref 1.5–6.5)
Platelets: 400 10*3/uL (ref 145–400)
RBC: 2.39 10*6/uL — ABNORMAL LOW (ref 4.20–5.70)
RDW: 17.7 % — ABNORMAL HIGH (ref 11.1–15.7)
WBC: 4.6 10*3/uL (ref 4.0–10.0)

## 2014-03-04 MED ORDER — DARBEPOETIN ALFA-POLYSORBATE 300 MCG/0.6ML IJ SOLN
300.0000 ug | INTRAMUSCULAR | Status: DC
Start: 2014-03-04 — End: 2014-03-04
  Administered 2014-03-04: 300 ug via SUBCUTANEOUS

## 2014-03-04 MED ORDER — DARBEPOETIN ALFA-POLYSORBATE 300 MCG/0.6ML IJ SOLN
INTRAMUSCULAR | Status: AC
  Filled 2014-03-04: qty 0.6

## 2014-03-04 NOTE — Progress Notes (Signed)
Hematology and Oncology Follow Up Visit  Alexander Rice [id number] 1932/08/06 80 y.o. 03/04/2014   Principle Diagnosis:   Anemia of renal insufficiency  Refractory anemia-low-grade  Pernicious anemia  Current Therapy:    Aranesp 300 mcg subcutaneous as needed for him level less than 10  Vitamin B12 1 mg IM every month     Interim History:  Mr.  Rice is back for followup. He feels fairly well. He still has the back issues.  When we last saw him, his hemoglobin was 8.4. He was doing well. His transferrin receptor was 141. Ferritin was 597 with an iron saturation of 49%. We did going him today Aranesp injection.  He has had no problems bleeding. He's had no nausea vomiting. He's had no leg swelling. He's had no cough or shortness of breath.  Medications: Current outpatient prescriptions:aspirin 81 MG tablet, Take 81 mg by mouth daily.  , Disp: , Rfl: ;  Cyanocobalamin (VITAMIN B 12 PO), Inject as directed every 30 (thirty) days., Disp: , Rfl: ;  dorzolamide (TRUSOPT) 2 % ophthalmic solution, Place 1 drop into both eyes 2 (two) times daily.  , Disp: , Rfl: ;  doxazosin (CARDURA) 8 MG tablet, Take 1 tablet (8 mg total) by mouth at bedtime., Disp: 90 tablet, Rfl: 3 isosorbide mononitrate (IMDUR) 30 MG 24 hr tablet, Take 1 tablet (30 mg total) by mouth daily., Disp: 90 tablet, Rfl: 3;  LUMIGAN 0.01 % SOLN, Place 1 drop into both eyes daily., Disp: , Rfl: ;  Multiple Vitamin (MULTIVITAMIN) tablet, Take 1 tablet by mouth daily.  , Disp: , Rfl: ;  omeprazole (PRILOSEC) 20 MG capsule, Take 1 capsule (20 mg total) by mouth daily., Disp: 90 capsule, Rfl: 3 oxybutynin (DITROPAN) 5 MG tablet, Take 5 mg by mouth 2 (two) times daily. Patient states he takes once daily., Disp: , Rfl: ;  potassium chloride SA (K-DUR,KLOR-CON) 20 MEQ tablet, Take 1 tablet (20 mEq total) by mouth daily., Disp: 90 tablet, Rfl: 3;  simvastatin (ZOCOR) 40 MG tablet, Take 1 tablet (40 mg total) by mouth at bedtime., Disp:  90 tablet, Rfl: 3;  azithromycin (ZITHROMAX) 250 MG tablet, As directed, Disp: 6 tablet, Rfl: 0 nitroGLYCERIN (NITROSTAT) 0.4 MG SL tablet, Place 0.4 mg under the tongue every 5 (five) minutes as needed.  , Disp: , Rfl:  No current facility-administered medications for this visit. Facility-Administered Medications Ordered in Other Visits: darbepoetin (ARANESP) injection 300 mcg, 300 mcg, Subcutaneous, Once, [name], MD;  darbepoetin Kyra Searles) injection 300 mcg, 300 mcg, Subcutaneous, Q28 days, Helayne Seminole, PA-C, 300 mcg at 01/18/14 1135  Allergies: No Known Allergies  Past Medical History, Surgical history, Social history, and Family History were reviewed and updated.  Review of Systems: As above  Physical Exam:  height is 5' 8"  (1.727 m) and weight is 170 lb (77.111 kg). His oral temperature is 97.9 F (36.6 C). His blood pressure is 124/48 and his pulse is 70. His respiration is 18.   Elderly gentleman. He has kyphosis. His lungs are clear. Cardiac exam regular in rhythm. Abdomen soft. There is no fluid wave. He has good bowel sounds. There is no palpable hepato- splenomegaly. Neck exam shows kyphosis. There is no tenderness over the spine. Extremities shows age-related osteophytic changes in his joints. He has a very lady muscle atrophy. He has decent strength. Skin exam shows scattered ecchymoses. Neurological exam is nonfocal.  Lab Results  Component Value Date   WBC 4.6 03/04/2014   HGB  8.6* 03/04/2014   HCT 26.2* 03/04/2014   MCV 110* 03/04/2014   PLT 400 03/04/2014     Chemistry      Component Value Date/Time   NA 132* 07/21/2013 1010   K 4.3 07/21/2013 1010   CL 103 07/21/2013 1010   CO2 24 07/21/2013 1010   BUN 27* 07/21/2013 1010   CREATININE 1.3 07/21/2013 1010      Component Value Date/Time   CALCIUM 8.6 07/21/2013 1010   ALKPHOS 36* 07/21/2013 1010   AST 17 07/21/2013 1010   ALT 14 07/21/2013 1010   BILITOT 0.7 07/21/2013 1010       Ferritin is 402. Iron saturation 97%. Total  iron 184. His reticulocyte count, when corrected, is a 0.4  Impression and Plan: Alexander Rice is 80 year old gentleman. Has multifactorial anemia.  Am somewhat worried about the fact that he got Aranesp just 3 weeks ago and yet his blood count is worse. He is not that symptomatic.  I don't think he is iron deficient. I am not sure if there is any type of resistance to the Aranesp.  I will give him a dose today. He is getting ready to go down to [place] for the [employer] next week. I want him to go down and have a good time to not have problems.  I want to see him back in a couple weeks. We find that his hemoglobin is no better, and that we're going to have to consider a bone marrow biopsy on him.   [name], MD 4/16/20154:22 PM

## 2014-03-04 NOTE — Patient Instructions (Signed)
Darbepoetin Alfa injection What is this medicine? DARBEPOETIN ALFA (dar be POE e tin AL fa) helps your body make more red blood cells. It is used to treat anemia caused by chronic kidney failure and chemotherapy. This medicine may be used for other purposes; ask your health care provider or pharmacist if you have questions. COMMON BRAND NAME(S): Aranesp What should I tell my health care provider before I take this medicine? They need to know if you have any of these conditions: -blood clotting disorders or history of blood clots -cancer patient not on chemotherapy -cystic fibrosis -heart disease, such as angina, heart failure, or a history of a heart attack -hemoglobin level of 12 g/dL or greater -high blood pressure -low levels of folate, iron, or vitamin B12 -seizures -an unusual or allergic reaction to darbepoetin, erythropoietin, albumin, hamster proteins, latex, other medicines, foods, dyes, or preservatives -pregnant or trying to get pregnant -breast-feeding How should I use this medicine? This medicine is for injection into a vein or under the skin. It is usually given by a health care professional in a hospital or clinic setting. If you get this medicine at home, you will be taught how to prepare and give this medicine. Do not shake the solution before you withdraw a dose. Use exactly as directed. Take your medicine at regular intervals. Do not take your medicine more often than directed. It is important that you put your used needles and syringes in a special sharps container. Do not put them in a trash can. If you do not have a sharps container, call your pharmacist or healthcare provider to get one. Talk to your pediatrician regarding the use of this medicine in children. While this medicine may be used in children as young as 1 year for selected conditions, precautions do apply. Overdosage: If you think you have taken too much of this medicine contact a poison control center or  emergency room at once. NOTE: This medicine is only for you. Do not share this medicine with others. What if I miss a dose? If you miss a dose, take it as soon as you can. If it is almost time for your next dose, take only that dose. Do not take double or extra doses. What may interact with this medicine? Do not take this medicine with any of the following medications: -epoetin alfa This list may not describe all possible interactions. Give your health care provider a list of all the medicines, herbs, non-prescription drugs, or dietary supplements you use. Also tell them if you smoke, drink alcohol, or use illegal drugs. Some items may interact with your medicine. What should I watch for while using this medicine? Visit your prescriber or health care professional for regular checks on your progress and for the needed blood tests and blood pressure measurements. It is especially important for the doctor to make sure your hemoglobin level is in the desired range, to limit the risk of potential side effects and to give you the best benefit. Keep all appointments for any recommended tests. Check your blood pressure as directed. Ask your doctor what your blood pressure should be and when you should contact him or her. As your body makes more red blood cells, you may need to take iron, folic acid, or vitamin B supplements. Ask your doctor or health care provider which products are right for you. If you have kidney disease continue dietary restrictions, even though this medication can make you feel better. Talk with your doctor or health   care professional about the foods you eat and the vitamins that you take. What side effects may I notice from receiving this medicine? Side effects that you should report to your doctor or health care professional as soon as possible: -allergic reactions like skin rash, itching or hives, swelling of the face, lips, or tongue -breathing problems -changes in vision -chest  pain -confusion, trouble speaking or understanding -feeling faint or lightheaded, falls -high blood pressure -muscle aches or pains -pain, swelling, warmth in the leg -rapid weight gain -severe headaches -sudden numbness or weakness of the face, arm or leg -trouble walking, dizziness, loss of balance or coordination -seizures (convulsions) -swelling of the ankles, feet, hands -unusually weak or tired Side effects that usually do not require medical attention (report to your doctor or health care professional if they continue or are bothersome): -diarrhea -fever, chills (flu-like symptoms) -headaches -nausea, vomiting -redness, stinging, or swelling at site where injected This list may not describe all possible side effects. Call your doctor for medical advice about side effects. You may report side effects to FDA at 1-800-FDA-1088. Where should I keep my medicine? Keep out of the reach of children. Store in a refrigerator between 2 and 8 degrees C (36 and 46 degrees F). Do not freeze. Do not shake. Throw away any unused portion if using a single-dose vial. Throw away any unused medicine after the expiration date. NOTE: This sheet is a summary. It may not cover all possible information. If you have questions about this medicine, talk to your doctor, pharmacist, or health care provider.  2014, Elsevier/Gold Standard. (2008-10-19 10:23:57)  

## 2014-03-08 ENCOUNTER — Ambulatory Visit (INDEPENDENT_AMBULATORY_CARE_PROVIDER_SITE_OTHER): Payer: Medicare Other | Admitting: *Deleted

## 2014-03-08 DIAGNOSIS — E538 Deficiency of other specified B group vitamins: Secondary | ICD-10-CM | POA: Diagnosis not present

## 2014-03-08 MED ORDER — CYANOCOBALAMIN 1000 MCG/ML IJ SOLN
1000.0000 ug | Freq: Once | INTRAMUSCULAR | Status: AC
  Administered 2014-03-08: 1000 ug via INTRAMUSCULAR

## 2014-03-08 MED ORDER — CYANOCOBALAMIN 1000 MCG/ML IJ SOLN: 1000.0000 ug | Freq: Once | INTRAMUSCULAR | Status: DC

## 2014-03-18 DIAGNOSIS — M79609 Pain in unspecified limb: Secondary | ICD-10-CM | POA: Diagnosis not present

## 2014-03-18 DIAGNOSIS — B351 Tinea unguium: Secondary | ICD-10-CM | POA: Diagnosis not present

## 2014-03-25 ENCOUNTER — Telehealth: Payer: Self-pay | Admitting: *Deleted

## 2014-03-25 DIAGNOSIS — R911 Solitary pulmonary nodule: Secondary | ICD-10-CM

## 2014-03-25 NOTE — Telephone Encounter (Signed)
Spoke with the pt and notified due for ct chest to f/u pulmonary nodule  Pt verbalized understanding and order has been sent to [facility]

## 2014-03-25 NOTE — Telephone Encounter (Signed)
Message copied by Rosana Berger on Thu Mar 25, 2014 11:34 AM ------      Message from: Christinia Gully B      Created: Wed Mar 25, 2013  9:57 AM       Needs ov with cxr by now ------

## 2014-03-26 ENCOUNTER — Encounter: Payer: Self-pay | Admitting: Hematology & Oncology

## 2014-03-26 ENCOUNTER — Ambulatory Visit: Payer: Medicare Other

## 2014-03-26 ENCOUNTER — Ambulatory Visit (HOSPITAL_BASED_OUTPATIENT_CLINIC_OR_DEPARTMENT_OTHER): Payer: Medicare Other | Admitting: Hematology & Oncology

## 2014-03-26 ENCOUNTER — Other Ambulatory Visit (HOSPITAL_BASED_OUTPATIENT_CLINIC_OR_DEPARTMENT_OTHER): Payer: Medicare Other | Admitting: Lab

## 2014-03-26 VITALS — BP 175/53 | HR 67 | Temp 97.9°F | Resp 16 | Ht 65.0 in | Wt 166.0 lb

## 2014-03-26 DIAGNOSIS — D4620 Refractory anemia with excess of blasts, unspecified: Secondary | ICD-10-CM

## 2014-03-26 DIAGNOSIS — D631 Anemia in chronic kidney disease: Secondary | ICD-10-CM

## 2014-03-26 DIAGNOSIS — D469 Myelodysplastic syndrome, unspecified: Secondary | ICD-10-CM

## 2014-03-26 DIAGNOSIS — N289 Disorder of kidney and ureter, unspecified: Secondary | ICD-10-CM | POA: Diagnosis not present

## 2014-03-26 DIAGNOSIS — D649 Anemia, unspecified: Secondary | ICD-10-CM | POA: Diagnosis not present

## 2014-03-26 DIAGNOSIS — N039 Chronic nephritic syndrome with unspecified morphologic changes: Secondary | ICD-10-CM | POA: Diagnosis not present

## 2014-03-26 DIAGNOSIS — N189 Chronic kidney disease, unspecified: Secondary | ICD-10-CM

## 2014-03-26 LAB — CBC WITH DIFFERENTIAL (CANCER CENTER ONLY)
BASO#: 0 10*3/uL (ref 0.0–0.2)
BASO%: 0.3 % (ref 0.0–2.0)
EOS ABS: 0.1 10*3/uL (ref 0.0–0.5)
EOS%: 4 % (ref 0.0–7.0)
HCT: 28.5 % — ABNORMAL LOW (ref 38.7–49.9)
HGB: 9.6 g/dL — ABNORMAL LOW (ref 13.0–17.1)
LYMPH#: 1.1 10*3/uL (ref 0.9–3.3)
LYMPH%: 38 % (ref 14.0–48.0)
MCH: 36.2 pg — AB (ref 28.0–33.4)
MCHC: 33.7 g/dL (ref 32.0–35.9)
MCV: 108 fL — ABNORMAL HIGH (ref 82–98)
MONO#: 0.2 10*3/uL (ref 0.1–0.9)
MONO%: 7.4 % (ref 0.0–13.0)
NEUT#: 1.5 10*3/uL (ref 1.5–6.5)
NEUT%: 50.3 % (ref 40.0–80.0)
PLATELETS: 380 10*3/uL (ref 145–400)
RBC: 2.65 10*6/uL — AB (ref 4.20–5.70)
RDW: 16.3 % — ABNORMAL HIGH (ref 11.1–15.7)
WBC: 3 10*3/uL — AB (ref 4.0–10.0)

## 2014-03-26 LAB — IRON AND TIBC CHCC
%SAT: 81 % — ABNORMAL HIGH (ref 20–55)
IRON: 162 ug/dL (ref 42–163)
TIBC: 200 ug/dL — ABNORMAL LOW (ref 202–409)
UIBC: 38 ug/dL — AB (ref 117–376)

## 2014-03-26 LAB — CHCC SATELLITE - SMEAR

## 2014-03-26 LAB — FERRITIN CHCC: Ferritin: 498 ng/ml — ABNORMAL HIGH (ref 22–316)

## 2014-03-26 NOTE — Progress Notes (Signed)
Hematology and Oncology Follow Up Visit  Alexander Rice [id number] 04-26-1932 80 y.o. 03/26/2014   Principle Diagnosis:   Anemia of renal insufficiency  Refractory anemia-low-grade  Pernicious anemia  Current Therapy:    Aranesp 300 mcg subcutaneous as needed for him level less than 10  Vitamin B12 1 mg IM every month     Interim History:  Mr.  Rice is back for followup. He feels fairly well. He still has the back issues.  When we last saw him, his hemoglobin was 8.6. He was doing well. His transferrin receptor was 141. Ferritin was 402 with an iron saturation of 97%.  He has had no problems bleeding. He's had no nausea vomiting. He's had no leg swelling. He's had no cough or shortness of breath.  Medications: Current outpatient prescriptions:aspirin 81 MG tablet, Take 81 mg by mouth daily.  , Disp: , Rfl: ;  Cyanocobalamin (VITAMIN B 12 PO), Inject as directed every 30 (thirty) days., Disp: , Rfl: ;  dorzolamide (TRUSOPT) 2 % ophthalmic solution, Place 1 drop into both eyes 2 (two) times daily.  , Disp: , Rfl: ;  doxazosin (CARDURA) 8 MG tablet, Take 1 tablet (8 mg total) by mouth at bedtime., Disp: 90 tablet, Rfl: 3 isosorbide mononitrate (IMDUR) 30 MG 24 hr tablet, Take 1 tablet (30 mg total) by mouth daily., Disp: 90 tablet, Rfl: 3;  LUMIGAN 0.01 % SOLN, Place 1 drop into both eyes daily., Disp: , Rfl: ;  Multiple Vitamin (MULTIVITAMIN) tablet, Take 1 tablet by mouth daily.  , Disp: , Rfl: ;  nitroGLYCERIN (NITROSTAT) 0.4 MG SL tablet, Place 0.4 mg under the tongue every 5 (five) minutes as needed.  , Disp: , Rfl:  omeprazole (PRILOSEC) 20 MG capsule, Take 1 capsule (20 mg total) by mouth daily., Disp: 90 capsule, Rfl: 3;  oxybutynin (DITROPAN) 5 MG tablet, Take 5 mg by mouth 2 (two) times daily. Patient states he takes once daily., Disp: , Rfl: ;  potassium chloride SA (K-DUR,KLOR-CON) 20 MEQ tablet, Take 1 tablet (20 mEq total) by mouth daily., Disp: 90 tablet, Rfl:  3 simvastatin (ZOCOR) 40 MG tablet, Take 1 tablet (40 mg total) by mouth at bedtime., Disp: 90 tablet, Rfl: 3;  azithromycin (ZITHROMAX) 250 MG tablet, As directed, Disp: 6 tablet, Rfl: 0 No current facility-administered medications for this visit. Facility-Administered Medications Ordered in Other Visits: darbepoetin (ARANESP) injection 300 mcg, 300 mcg, Subcutaneous, Once, [name], MD;  darbepoetin Kyra Searles) injection 300 mcg, 300 mcg, Subcutaneous, Q28 days, Helayne Seminole, PA-C, 300 mcg at 01/18/14 1135  Allergies: No Known Allergies  Past Medical History, Surgical history, Social history, and Family History were reviewed and updated.  Review of Systems: As above  Physical Exam:  height is 5\' 5"  (1.651 m) and weight is 166 lb (75.297 kg). His oral temperature is 97.9 F (36.6 C). His blood pressure is 175/53 and his pulse is 67. His respiration is 16.   Elderly gentleman. He has kyphosis. His lungs are clear. Cardiac exam regular in rhythm. Abdomen soft. There is no fluid wave. He has good bowel sounds. There is no palpable hepato- splenomegaly. Neck exam shows kyphosis. There is no tenderness over the spine. Extremities shows age-related osteophytic changes in his joints. He has a very lady muscle atrophy. He has decent strength. Skin exam shows scattered ecchymoses. Neurological exam is nonfocal.  Lab Results  Component Value Date   WBC 3.0* 03/26/2014   HGB 9.6* 03/26/2014   HCT 28.5* 03/26/2014  MCV 108* 03/26/2014   PLT 380 03/26/2014     Chemistry      Component Value Date/Time   NA 132* 07/21/2013 1010   K 4.3 07/21/2013 1010   CL 103 07/21/2013 1010   CO2 24 07/21/2013 1010   BUN 27* 07/21/2013 1010   CREATININE 1.3 07/21/2013 1010      Component Value Date/Time   CALCIUM 8.6 07/21/2013 1010   ALKPHOS 36* 07/21/2013 1010   AST 17 07/21/2013 1010   ALT 14 07/21/2013 1010   BILITOT 0.7 07/21/2013 1010         Impression and Plan: Alexander Rice is 80 year old gentleman. Has  multifactorial anemia.  I don't think he is iron deficient. I am not sure if there is any type of resistance to the Aranesp.  I will Not give him a dose today.  I want to see him back in a 4 weeks. I am glad that his hemoglobin is slowly coming back up now. He feels better. I don't see that we have to do a bone marrow on him. [name], MD 5/8/20159:10 AM

## 2014-03-29 LAB — RETICULOCYTES (CHCC)
ABS RETIC: 13.7 10*3/uL — AB (ref 19.0–186.0)
RBC.: 2.73 MIL/uL — ABNORMAL LOW (ref 4.22–5.81)
Retic Ct Pct: 0.5 % (ref 0.4–2.3)

## 2014-03-29 LAB — ERYTHROPOIETIN: ERYTHROPOIETIN: 29.1 m[IU]/mL — AB (ref 2.6–18.5)

## 2014-03-30 DIAGNOSIS — H4011X0 Primary open-angle glaucoma, stage unspecified: Secondary | ICD-10-CM | POA: Diagnosis not present

## 2014-03-30 DIAGNOSIS — H35039 Hypertensive retinopathy, unspecified eye: Secondary | ICD-10-CM | POA: Diagnosis not present

## 2014-03-30 DIAGNOSIS — H409 Unspecified glaucoma: Secondary | ICD-10-CM | POA: Diagnosis not present

## 2014-03-30 DIAGNOSIS — H524 Presbyopia: Secondary | ICD-10-CM | POA: Diagnosis not present

## 2014-03-31 ENCOUNTER — Telehealth: Payer: Self-pay | Admitting: Internal Medicine

## 2014-03-31 ENCOUNTER — Ambulatory Visit (INDEPENDENT_AMBULATORY_CARE_PROVIDER_SITE_OTHER)
Admission: RE | Admit: 2014-03-31 | Discharge: 2014-03-31 | Disposition: A | Discharge status: Home / Self Care | Payer: Medicare Other | Source: Ambulatory Visit | Attending: Internal Medicine | Admitting: Internal Medicine

## 2014-03-31 DIAGNOSIS — R911 Solitary pulmonary nodule: Secondary | ICD-10-CM | POA: Diagnosis not present

## 2014-03-31 NOTE — Telephone Encounter (Signed)
Call patient : Study is unremarkable, no change so this represents 2 years of stability and no further ct's > cxr in one year with pulmonary f/u rec ( I placed in tickle) Spoke with pt and notified of results per Dr. Melvyn Novas. Pt verbalized understanding and denied any questions.

## 2014-03-31 NOTE — Progress Notes (Signed)
Quick Note:  LMTCB ______ 

## 2014-04-13 ENCOUNTER — Other Ambulatory Visit: Payer: Self-pay | Admitting: Internal Medicine

## 2014-04-26 ENCOUNTER — Ambulatory Visit (HOSPITAL_BASED_OUTPATIENT_CLINIC_OR_DEPARTMENT_OTHER): Payer: Medicare Other

## 2014-04-26 ENCOUNTER — Ambulatory Visit (HOSPITAL_BASED_OUTPATIENT_CLINIC_OR_DEPARTMENT_OTHER): Payer: Medicare Other | Admitting: Hematology & Oncology

## 2014-04-26 ENCOUNTER — Other Ambulatory Visit (HOSPITAL_BASED_OUTPATIENT_CLINIC_OR_DEPARTMENT_OTHER): Payer: Medicare Other | Admitting: Lab

## 2014-04-26 ENCOUNTER — Encounter: Payer: Self-pay | Admitting: Hematology & Oncology

## 2014-04-26 VITALS — BP 142/48 | HR 81 | Temp 98.2°F | Resp 18 | Ht 65.0 in | Wt 168.0 lb

## 2014-04-26 DIAGNOSIS — N289 Disorder of kidney and ureter, unspecified: Secondary | ICD-10-CM

## 2014-04-26 DIAGNOSIS — D4620 Refractory anemia with excess of blasts, unspecified: Secondary | ICD-10-CM

## 2014-04-26 DIAGNOSIS — D510 Vitamin B12 deficiency anemia due to intrinsic factor deficiency: Secondary | ICD-10-CM | POA: Diagnosis not present

## 2014-04-26 DIAGNOSIS — D469 Myelodysplastic syndrome, unspecified: Secondary | ICD-10-CM | POA: Diagnosis not present

## 2014-04-26 DIAGNOSIS — D649 Anemia, unspecified: Secondary | ICD-10-CM

## 2014-04-26 DIAGNOSIS — N259 Disorder resulting from impaired renal tubular function, unspecified: Secondary | ICD-10-CM

## 2014-04-26 LAB — IRON AND TIBC CHCC
%SAT: 95 % — ABNORMAL HIGH (ref 20–55)
IRON: 182 ug/dL — AB (ref 42–163)
TIBC: 191 ug/dL — AB (ref 202–409)
UIBC: 9 ug/dL — AB (ref 117–376)

## 2014-04-26 LAB — CBC WITH DIFFERENTIAL (CANCER CENTER ONLY)
BASO#: 0 10*3/uL (ref 0.0–0.2)
BASO%: 0.2 % (ref 0.0–2.0)
EOS%: 1.2 % (ref 0.0–7.0)
Eosinophils Absolute: 0.1 10*3/uL (ref 0.0–0.5)
HCT: 26.9 % — ABNORMAL LOW (ref 38.7–49.9)
HGB: 9 g/dL — ABNORMAL LOW (ref 13.0–17.1)
LYMPH#: 1.2 10*3/uL (ref 0.9–3.3)
LYMPH%: 29.4 % (ref 14.0–48.0)
MCH: 36 pg — ABNORMAL HIGH (ref 28.0–33.4)
MCHC: 33.5 g/dL (ref 32.0–35.9)
MCV: 108 fL — ABNORMAL HIGH (ref 82–98)
MONO#: 0.3 10*3/uL (ref 0.1–0.9)
MONO%: 7.9 % (ref 0.0–13.0)
NEUT%: 61.3 % (ref 40.0–80.0)
NEUTROS ABS: 2.6 10*3/uL (ref 1.5–6.5)
Platelets: 353 10*3/uL (ref 145–400)
RBC: 2.5 10*6/uL — ABNORMAL LOW (ref 4.20–5.70)
RDW: 14.4 % (ref 11.1–15.7)
WBC: 4.2 10*3/uL (ref 4.0–10.0)

## 2014-04-26 LAB — RETICULOCYTES (CHCC)
ABS Retic: 15.2 10*3/uL — ABNORMAL LOW (ref 19.0–186.0)
RBC.: 2.53 MIL/uL — AB (ref 4.22–5.81)
Retic Ct Pct: 0.6 % (ref 0.4–2.3)

## 2014-04-26 LAB — FERRITIN CHCC: Ferritin: 543 ng/ml — ABNORMAL HIGH (ref 22–316)

## 2014-04-26 MED ORDER — DARBEPOETIN ALFA-POLYSORBATE 300 MCG/0.6ML IJ SOLN
INTRAMUSCULAR | Status: AC
  Filled 2014-04-26: qty 0.6

## 2014-04-26 MED ORDER — DARBEPOETIN ALFA-POLYSORBATE 300 MCG/0.6ML IJ SOLN
300.0000 ug | INTRAMUSCULAR | Status: DC
  Administered 2014-04-26: 300 ug via SUBCUTANEOUS

## 2014-04-26 NOTE — Progress Notes (Signed)
Hematology and Oncology Follow Up Visit  Alexander Rice [id number] Apr 01, 1932 80 y.o. 04/26/2014   Principle Diagnosis:   Anemia of renal insufficiency  Refractory anemia-low-grade  Pernicious anemia  Current Therapy:   Aranesp 300 mcg subcutaneous as needed for him level less than 10     Interim History:  Mr.  Rice is back for followup. He's doing quite well. He is responding a little better to the Aranesp.  Is not as fatigued. He is not as short of breath. He's had chronic back issues but these have been doing fairly well.  He's had no leg swelling. He's had no rashes. He's had no fever.  His last iron studies back in May 2 ferritin 498 with an iron saturation of 81%.  In May, and his erythropoietin level was 29.   He did have a CT scan done in May. He had a stable, tiny pleural nodules. These were noted a year ago.   Medications: Current outpatient prescriptions:aspirin 81 MG tablet, Take 81 mg by mouth daily.  , Disp: , Rfl: ;  Cyanocobalamin (VITAMIN B 12 PO), Inject as directed every 30 (thirty) days., Disp: , Rfl: ;  dorzolamide (TRUSOPT) 2 % ophthalmic solution, Place 1 drop into both eyes 2 (two) times daily.  , Disp: , Rfl: ;  doxazosin (CARDURA) 8 MG tablet, Take 1 tablet (8 mg total) by mouth at bedtime., Disp: 90 tablet, Rfl: 3 isosorbide mononitrate (IMDUR) 30 MG 24 hr tablet, Take 1 tablet (30 mg total) by mouth daily., Disp: 90 tablet, Rfl: 3;  LUMIGAN 0.01 % SOLN, Place 1 drop into both eyes daily., Disp: , Rfl: ;  Multiple Vitamin (MULTIVITAMIN) tablet, Take 1 tablet by mouth daily.  , Disp: , Rfl: ;  nitroGLYCERIN (NITROSTAT) 0.4 MG SL tablet, Place 0.4 mg under the tongue every 5 (five) minutes as needed.  , Disp: , Rfl:  omeprazole (PRILOSEC) 20 MG capsule, Take 1 capsule (20 mg total) by mouth daily., Disp: 90 capsule, Rfl: 3;  oxybutynin (DITROPAN) 5 MG tablet, Take 5 mg by mouth 2 (two) times daily. Patient states he takes once daily., Disp: , Rfl: ;   potassium chloride SA (K-DUR,KLOR-CON) 20 MEQ tablet, Take 1 tablet (20 mEq total) by mouth daily., Disp: 90 tablet, Rfl: 3 simvastatin (ZOCOR) 40 MG tablet, Take 1 tablet by mouth  every night at bedtime, Disp: 90 tablet, Rfl: 1;  azithromycin (ZITHROMAX) 250 MG tablet, As directed, Disp: 6 tablet, Rfl: 0 No current facility-administered medications for this visit. Facility-Administered Medications Ordered in Other Visits: darbepoetin (ARANESP) injection 300 mcg, 300 mcg, Subcutaneous, Once, [name], MD;  darbepoetin Kyra Searles) injection 300 mcg, 300 mcg, Subcutaneous, Q28 days, Helayne Seminole, PA-C, 300 mcg at 01/18/14 1135  Allergies: No Known Allergies  Past Medical History, Surgical history, Social history, and Family History were reviewed and updated.  Review of Systems: As above  Physical Exam:  height is 5\' 5"  (1.651 m) and weight is 168 lb (76.204 kg). His oral temperature is 98.2 F (36.8 C). His blood pressure is 142/48 and his pulse is 81. His respiration is 18.   Thin, elderly white woman. There's kyphosis of the spine. His lungs are clear. Cardiac exam regular in rhythm. He is aware 6 systolic murmur. Abdomen is soft. It is no palpable liver or spleen tip. Extremities shows age-related changes in his joints. He has good strength. His mild edema, cycles. Skin exam no rashes. Neurological exam is nonfocal.  Lab Results  Component  Value Date   WBC 4.2 04/26/2014   HGB 9.0* 04/26/2014   HCT 26.9* 04/26/2014   MCV 108* 04/26/2014   PLT 353 04/26/2014     Chemistry      Component Value Date/Time   NA 132* 07/21/2013 1010   K 4.3 07/21/2013 1010   CL 103 07/21/2013 1010   CO2 24 07/21/2013 1010   BUN 27* 07/21/2013 1010   CREATININE 1.3 07/21/2013 1010      Component Value Date/Time   CALCIUM 8.6 07/21/2013 1010   ALKPHOS 36* 07/21/2013 1010   AST 17 07/21/2013 1010   ALT 14 07/21/2013 1010   BILITOT 0.7 07/21/2013 1010         Impression and Plan: Alexander Rice is 80 year old gentleman  with low-grade myelodysplasia. He has refractory anemia. He also has pernicious anemia and anemia of renal insufficiency.  We will go ahead and give him Aranesp today. I think this will help him.  We will go ahead and give him back in another 3 or 4 weeks.   [name], MD 6/8/201511:52 AM

## 2014-04-26 NOTE — Patient Instructions (Signed)
Darbepoetin Alfa injection What is this medicine? DARBEPOETIN ALFA (dar be POE e tin AL fa) helps your body make more red blood cells. It is used to treat anemia caused by chronic kidney failure and chemotherapy. This medicine may be used for other purposes; ask your health care provider or pharmacist if you have questions. COMMON BRAND NAME(S): Aranesp What should I tell my health care provider before I take this medicine? They need to know if you have any of these conditions: -blood clotting disorders or history of blood clots -cancer patient not on chemotherapy -cystic fibrosis -heart disease, such as angina, heart failure, or a history of a heart attack -hemoglobin level of 12 g/dL or greater -high blood pressure -low levels of folate, iron, or vitamin B12 -seizures -an unusual or allergic reaction to darbepoetin, erythropoietin, albumin, hamster proteins, latex, other medicines, foods, dyes, or preservatives -pregnant or trying to get pregnant -breast-feeding How should I use this medicine? This medicine is for injection into a vein or under the skin. It is usually given by a health care professional in a hospital or clinic setting. If you get this medicine at home, you will be taught how to prepare and give this medicine. Do not shake the solution before you withdraw a dose. Use exactly as directed. Take your medicine at regular intervals. Do not take your medicine more often than directed. It is important that you put your used needles and syringes in a special sharps container. Do not put them in a trash can. If you do not have a sharps container, call your pharmacist or healthcare provider to get one. Talk to your pediatrician regarding the use of this medicine in children. While this medicine may be used in children as young as 1 year for selected conditions, precautions do apply. Overdosage: If you think you have taken too much of this medicine contact a poison control center or  emergency room at once. NOTE: This medicine is only for you. Do not share this medicine with others. What if I miss a dose? If you miss a dose, take it as soon as you can. If it is almost time for your next dose, take only that dose. Do not take double or extra doses. What may interact with this medicine? Do not take this medicine with any of the following medications: -epoetin alfa This list may not describe all possible interactions. Give your health care provider a list of all the medicines, herbs, non-prescription drugs, or dietary supplements you use. Also tell them if you smoke, drink alcohol, or use illegal drugs. Some items may interact with your medicine. What should I watch for while using this medicine? Visit your prescriber or health care professional for regular checks on your progress and for the needed blood tests and blood pressure measurements. It is especially important for the doctor to make sure your hemoglobin level is in the desired range, to limit the risk of potential side effects and to give you the best benefit. Keep all appointments for any recommended tests. Check your blood pressure as directed. Ask your doctor what your blood pressure should be and when you should contact him or her. As your body makes more red blood cells, you may need to take iron, folic acid, or vitamin B supplements. Ask your doctor or health care provider which products are right for you. If you have kidney disease continue dietary restrictions, even though this medication can make you feel better. Talk with your doctor or health   care professional about the foods you eat and the vitamins that you take. What side effects may I notice from receiving this medicine? Side effects that you should report to your doctor or health care professional as soon as possible: -allergic reactions like skin rash, itching or hives, swelling of the face, lips, or tongue -breathing problems -changes in vision -chest  pain -confusion, trouble speaking or understanding -feeling faint or lightheaded, falls -high blood pressure -muscle aches or pains -pain, swelling, warmth in the leg -rapid weight gain -severe headaches -sudden numbness or weakness of the face, arm or leg -trouble walking, dizziness, loss of balance or coordination -seizures (convulsions) -swelling of the ankles, feet, hands -unusually weak or tired Side effects that usually do not require medical attention (report to your doctor or health care professional if they continue or are bothersome): -diarrhea -fever, chills (flu-like symptoms) -headaches -nausea, vomiting -redness, stinging, or swelling at site where injected This list may not describe all possible side effects. Call your doctor for medical advice about side effects. You may report side effects to FDA at 1-800-FDA-1088. Where should I keep my medicine? Keep out of the reach of children. Store in a refrigerator between 2 and 8 degrees C (36 and 46 degrees F). Do not freeze. Do not shake. Throw away any unused portion if using a single-dose vial. Throw away any unused medicine after the expiration date. NOTE: This sheet is a summary. It may not cover all possible information. If you have questions about this medicine, talk to your doctor, pharmacist, or health care provider.  2014, Elsevier/Gold Standard. (2008-10-19 10:23:57)  

## 2014-04-27 ENCOUNTER — Ambulatory Visit: Payer: Medicare Other | Admitting: *Deleted

## 2014-04-28 ENCOUNTER — Ambulatory Visit (INDEPENDENT_AMBULATORY_CARE_PROVIDER_SITE_OTHER): Payer: Medicare Other | Admitting: *Deleted

## 2014-04-28 DIAGNOSIS — E538 Deficiency of other specified B group vitamins: Secondary | ICD-10-CM

## 2014-04-28 MED ORDER — CYANOCOBALAMIN 1000 MCG/ML IJ SOLN
1000.0000 ug | Freq: Once | INTRAMUSCULAR | Status: AC
  Administered 2014-04-28: 1000 ug via INTRAMUSCULAR

## 2014-05-24 ENCOUNTER — Other Ambulatory Visit (HOSPITAL_BASED_OUTPATIENT_CLINIC_OR_DEPARTMENT_OTHER): Payer: Medicare Other | Admitting: Lab

## 2014-05-24 ENCOUNTER — Ambulatory Visit (HOSPITAL_BASED_OUTPATIENT_CLINIC_OR_DEPARTMENT_OTHER): Payer: Medicare Other

## 2014-05-24 VITALS — BP 157/69 | HR 63 | Temp 96.7°F | Resp 18

## 2014-05-24 DIAGNOSIS — N289 Disorder of kidney and ureter, unspecified: Secondary | ICD-10-CM | POA: Diagnosis not present

## 2014-05-24 DIAGNOSIS — D469 Myelodysplastic syndrome, unspecified: Secondary | ICD-10-CM

## 2014-05-24 DIAGNOSIS — N259 Disorder resulting from impaired renal tubular function, unspecified: Secondary | ICD-10-CM

## 2014-05-24 DIAGNOSIS — D649 Anemia, unspecified: Secondary | ICD-10-CM

## 2014-05-24 LAB — CBC WITH DIFFERENTIAL (CANCER CENTER ONLY)
BASO#: 0 10*3/uL (ref 0.0–0.2)
BASO%: 0.3 % (ref 0.0–2.0)
EOS%: 2.1 % (ref 0.0–7.0)
Eosinophils Absolute: 0.1 10*3/uL (ref 0.0–0.5)
HEMATOCRIT: 25.5 % — AB (ref 38.7–49.9)
HGB: 8.6 g/dL — ABNORMAL LOW (ref 13.0–17.1)
LYMPH#: 1.2 10*3/uL (ref 0.9–3.3)
LYMPH%: 30.3 % (ref 14.0–48.0)
MCH: 36.6 pg — AB (ref 28.0–33.4)
MCHC: 33.7 g/dL (ref 32.0–35.9)
MCV: 109 fL — AB (ref 82–98)
MONO#: 0.3 10*3/uL (ref 0.1–0.9)
MONO%: 6.8 % (ref 0.0–13.0)
NEUT#: 2.3 10*3/uL (ref 1.5–6.5)
NEUT%: 60.5 % (ref 40.0–80.0)
Platelets: 376 10*3/uL (ref 145–400)
RBC: 2.35 10*6/uL — ABNORMAL LOW (ref 4.20–5.70)
RDW: 15 % (ref 11.1–15.7)
WBC: 3.8 10*3/uL — ABNORMAL LOW (ref 4.0–10.0)

## 2014-05-24 LAB — BASIC METABOLIC PANEL - CANCER CENTER ONLY
BUN, Bld: 25 mg/dL — ABNORMAL HIGH (ref 7–22)
CO2: 26 mEq/L (ref 18–33)
Calcium: 8.2 mg/dL (ref 8.0–10.3)
Chloride: 110 mEq/L — ABNORMAL HIGH (ref 98–108)
Creat: 1.2 mg/dl (ref 0.6–1.2)
Glucose, Bld: 107 mg/dL (ref 73–118)
Potassium: 4.1 mEq/L (ref 3.3–4.7)
SODIUM: 141 meq/L (ref 128–145)

## 2014-05-24 LAB — RETICULOCYTES (CHCC)
ABS RETIC: 19.4 10*3/uL (ref 19.0–186.0)
RBC.: 2.43 MIL/uL — AB (ref 4.22–5.81)
Retic Ct Pct: 0.8 % (ref 0.4–2.3)

## 2014-05-24 MED ORDER — DARBEPOETIN ALFA-POLYSORBATE 300 MCG/0.6ML IJ SOLN
INTRAMUSCULAR | Status: AC
  Filled 2014-05-24: qty 0.6

## 2014-05-24 MED ORDER — DARBEPOETIN ALFA-POLYSORBATE 300 MCG/0.6ML IJ SOLN
300.0000 ug | INTRAMUSCULAR | Status: DC
  Administered 2014-05-24: 300 ug via SUBCUTANEOUS

## 2014-05-24 NOTE — Patient Instructions (Signed)
Darbepoetin Alfa injection What is this medicine? DARBEPOETIN ALFA (dar be POE e tin AL fa) helps your body make more red blood cells. It is used to treat anemia caused by chronic kidney failure and chemotherapy. This medicine may be used for other purposes; ask your health care provider or pharmacist if you have questions. COMMON BRAND NAME(S): Aranesp What should I tell my health care provider before I take this medicine? They need to know if you have any of these conditions: -blood clotting disorders or history of blood clots -cancer patient not on chemotherapy -cystic fibrosis -heart disease, such as angina, heart failure, or a history of a heart attack -hemoglobin level of 12 g/dL or greater -high blood pressure -low levels of folate, iron, or vitamin B12 -seizures -an unusual or allergic reaction to darbepoetin, erythropoietin, albumin, hamster proteins, latex, other medicines, foods, dyes, or preservatives -pregnant or trying to get pregnant -breast-feeding How should I use this medicine? This medicine is for injection into a vein or under the skin. It is usually given by a health care professional in a hospital or clinic setting. If you get this medicine at home, you will be taught how to prepare and give this medicine. Do not shake the solution before you withdraw a dose. Use exactly as directed. Take your medicine at regular intervals. Do not take your medicine more often than directed. It is important that you put your used needles and syringes in a special sharps container. Do not put them in a trash can. If you do not have a sharps container, call your pharmacist or healthcare provider to get one. Talk to your pediatrician regarding the use of this medicine in children. While this medicine may be used in children as young as 1 year for selected conditions, precautions do apply. Overdosage: If you think you have taken too much of this medicine contact a poison control center or  emergency room at once. NOTE: This medicine is only for you. Do not share this medicine with others. What if I miss a dose? If you miss a dose, take it as soon as you can. If it is almost time for your next dose, take only that dose. Do not take double or extra doses. What may interact with this medicine? Do not take this medicine with any of the following medications: -epoetin alfa This list may not describe all possible interactions. Give your health care provider a list of all the medicines, herbs, non-prescription drugs, or dietary supplements you use. Also tell them if you smoke, drink alcohol, or use illegal drugs. Some items may interact with your medicine. What should I watch for while using this medicine? Visit your prescriber or health care professional for regular checks on your progress and for the needed blood tests and blood pressure measurements. It is especially important for the doctor to make sure your hemoglobin level is in the desired range, to limit the risk of potential side effects and to give you the best benefit. Keep all appointments for any recommended tests. Check your blood pressure as directed. Ask your doctor what your blood pressure should be and when you should contact him or her. As your body makes more red blood cells, you may need to take iron, folic acid, or vitamin B supplements. Ask your doctor or health care provider which products are right for you. If you have kidney disease continue dietary restrictions, even though this medication can make you feel better. Talk with your doctor or health   care professional about the foods you eat and the vitamins that you take. What side effects may I notice from receiving this medicine? Side effects that you should report to your doctor or health care professional as soon as possible: -allergic reactions like skin rash, itching or hives, swelling of the face, lips, or tongue -breathing problems -changes in vision -chest  pain -confusion, trouble speaking or understanding -feeling faint or lightheaded, falls -high blood pressure -muscle aches or pains -pain, swelling, warmth in the leg -rapid weight gain -severe headaches -sudden numbness or weakness of the face, arm or leg -trouble walking, dizziness, loss of balance or coordination -seizures (convulsions) -swelling of the ankles, feet, hands -unusually weak or tired Side effects that usually do not require medical attention (report to your doctor or health care professional if they continue or are bothersome): -diarrhea -fever, chills (flu-like symptoms) -headaches -nausea, vomiting -redness, stinging, or swelling at site where injected This list may not describe all possible side effects. Call your doctor for medical advice about side effects. You may report side effects to FDA at 1-800-FDA-1088. Where should I keep my medicine? Keep out of the reach of children. Store in a refrigerator between 2 and 8 degrees C (36 and 46 degrees F). Do not freeze. Do not shake. Throw away any unused portion if using a single-dose vial. Throw away any unused medicine after the expiration date. NOTE: This sheet is a summary. It may not cover all possible information. If you have questions about this medicine, talk to your doctor, pharmacist, or health care provider.  2014, Elsevier/Gold Standard. (2008-10-19 10:23:57)  

## 2014-05-25 LAB — FERRITIN CHCC: Ferritin: 516 ng/ml — ABNORMAL HIGH (ref 22–316)

## 2014-05-25 LAB — IRON AND TIBC CHCC
%SAT: 69 % — ABNORMAL HIGH (ref 20–55)
Iron: 136 ug/dL (ref 42–163)
TIBC: 198 ug/dL — ABNORMAL LOW (ref 202–409)
UIBC: 62 ug/dL — ABNORMAL LOW (ref 117–376)

## 2014-06-10 DIAGNOSIS — M79609 Pain in unspecified limb: Secondary | ICD-10-CM | POA: Diagnosis not present

## 2014-06-10 DIAGNOSIS — B351 Tinea unguium: Secondary | ICD-10-CM | POA: Diagnosis not present

## 2014-06-21 ENCOUNTER — Ambulatory Visit (HOSPITAL_BASED_OUTPATIENT_CLINIC_OR_DEPARTMENT_OTHER): Payer: Medicare Other

## 2014-06-21 ENCOUNTER — Encounter: Payer: Self-pay | Admitting: Family

## 2014-06-21 ENCOUNTER — Ambulatory Visit (HOSPITAL_BASED_OUTPATIENT_CLINIC_OR_DEPARTMENT_OTHER): Payer: Medicare Other | Admitting: Family

## 2014-06-21 ENCOUNTER — Other Ambulatory Visit (HOSPITAL_BASED_OUTPATIENT_CLINIC_OR_DEPARTMENT_OTHER): Payer: Medicare Other | Admitting: Lab

## 2014-06-21 VITALS — BP 171/62 | HR 65 | Temp 97.9°F | Resp 18 | Ht 65.0 in | Wt 168.0 lb

## 2014-06-21 DIAGNOSIS — N289 Disorder of kidney and ureter, unspecified: Secondary | ICD-10-CM | POA: Diagnosis not present

## 2014-06-21 DIAGNOSIS — D631 Anemia in chronic kidney disease: Secondary | ICD-10-CM | POA: Diagnosis not present

## 2014-06-21 DIAGNOSIS — D518 Other vitamin B12 deficiency anemias: Secondary | ICD-10-CM | POA: Diagnosis not present

## 2014-06-21 DIAGNOSIS — D469 Myelodysplastic syndrome, unspecified: Secondary | ICD-10-CM

## 2014-06-21 DIAGNOSIS — N189 Chronic kidney disease, unspecified: Secondary | ICD-10-CM | POA: Diagnosis not present

## 2014-06-21 DIAGNOSIS — D649 Anemia, unspecified: Secondary | ICD-10-CM | POA: Diagnosis not present

## 2014-06-21 DIAGNOSIS — N259 Disorder resulting from impaired renal tubular function, unspecified: Secondary | ICD-10-CM

## 2014-06-21 DIAGNOSIS — D4620 Refractory anemia with excess of blasts, unspecified: Secondary | ICD-10-CM

## 2014-06-21 DIAGNOSIS — N039 Chronic nephritic syndrome with unspecified morphologic changes: Secondary | ICD-10-CM | POA: Diagnosis not present

## 2014-06-21 LAB — CMP (CANCER CENTER ONLY)
ALBUMIN: 3.8 g/dL (ref 3.3–5.5)
ALK PHOS: 36 U/L (ref 26–84)
ALT(SGPT): 15 U/L (ref 10–47)
AST: 22 U/L (ref 11–38)
BILIRUBIN TOTAL: 0.9 mg/dL (ref 0.20–1.60)
BUN, Bld: 20 mg/dL (ref 7–22)
CO2: 26 mEq/L (ref 18–33)
Calcium: 8.1 mg/dL (ref 8.0–10.3)
Chloride: 104 mEq/L (ref 98–108)
Creat: 1.2 mg/dl (ref 0.6–1.2)
GLUCOSE: 90 mg/dL (ref 73–118)
POTASSIUM: 4.3 meq/L (ref 3.3–4.7)
Sodium: 141 mEq/L (ref 128–145)
Total Protein: 5.9 g/dL — ABNORMAL LOW (ref 6.4–8.1)

## 2014-06-21 LAB — CBC WITH DIFFERENTIAL (CANCER CENTER ONLY)
BASO#: 0 10*3/uL (ref 0.0–0.2)
BASO%: 0.2 % (ref 0.0–2.0)
EOS%: 3.1 % (ref 0.0–7.0)
Eosinophils Absolute: 0.1 10*3/uL (ref 0.0–0.5)
HEMATOCRIT: 24.8 % — AB (ref 38.7–49.9)
HGB: 8.4 g/dL — ABNORMAL LOW (ref 13.0–17.1)
LYMPH#: 1.2 10*3/uL (ref 0.9–3.3)
LYMPH%: 29.5 % (ref 14.0–48.0)
MCH: 36.7 pg — ABNORMAL HIGH (ref 28.0–33.4)
MCHC: 33.9 g/dL (ref 32.0–35.9)
MCV: 108 fL — ABNORMAL HIGH (ref 82–98)
MONO#: 0.3 10*3/uL (ref 0.1–0.9)
MONO%: 6.9 % (ref 0.0–13.0)
NEUT#: 2.5 10*3/uL (ref 1.5–6.5)
NEUT%: 60.3 % (ref 40.0–80.0)
Platelets: 431 10*3/uL — ABNORMAL HIGH (ref 145–400)
RBC: 2.29 10*6/uL — ABNORMAL LOW (ref 4.20–5.70)
RDW: 15.8 % — ABNORMAL HIGH (ref 11.1–15.7)
WBC: 4.2 10*3/uL (ref 4.0–10.0)

## 2014-06-21 LAB — CHCC SATELLITE - SMEAR

## 2014-06-21 MED ORDER — DARBEPOETIN ALFA-POLYSORBATE 300 MCG/0.6ML IJ SOLN
300.0000 ug | INTRAMUSCULAR | Status: DC
  Administered 2014-06-21: 300 ug via SUBCUTANEOUS

## 2014-06-21 MED ORDER — DARBEPOETIN ALFA-POLYSORBATE 300 MCG/0.6ML IJ SOLN
INTRAMUSCULAR | Status: AC
  Filled 2014-06-21: qty 0.6

## 2014-06-21 NOTE — Patient Instructions (Signed)
Darbepoetin Alfa injection What is this medicine? DARBEPOETIN ALFA (dar be POE e tin AL fa) helps your body make more red blood cells. It is used to treat anemia caused by chronic kidney failure and chemotherapy. This medicine may be used for other purposes; ask your health care provider or pharmacist if you have questions. COMMON BRAND NAME(S): Aranesp What should I tell my health care provider before I take this medicine? They need to know if you have any of these conditions: -blood clotting disorders or history of blood clots -cancer patient not on chemotherapy -cystic fibrosis -heart disease, such as angina, heart failure, or a history of a heart attack -hemoglobin level of 12 g/dL or greater -high blood pressure -low levels of folate, iron, or vitamin B12 -seizures -an unusual or allergic reaction to darbepoetin, erythropoietin, albumin, hamster proteins, latex, other medicines, foods, dyes, or preservatives -pregnant or trying to get pregnant -breast-feeding How should I use this medicine? This medicine is for injection into a vein or under the skin. It is usually given by a health care professional in a hospital or clinic setting. If you get this medicine at home, you will be taught how to prepare and give this medicine. Do not shake the solution before you withdraw a dose. Use exactly as directed. Take your medicine at regular intervals. Do not take your medicine more often than directed. It is important that you put your used needles and syringes in a special sharps container. Do not put them in a trash can. If you do not have a sharps container, call your pharmacist or healthcare provider to get one. Talk to your pediatrician regarding the use of this medicine in children. While this medicine may be used in children as young as 1 year for selected conditions, precautions do apply. Overdosage: If you think you have taken too much of this medicine contact a poison control center or  emergency room at once. NOTE: This medicine is only for you. Do not share this medicine with others. What if I miss a dose? If you miss a dose, take it as soon as you can. If it is almost time for your next dose, take only that dose. Do not take double or extra doses. What may interact with this medicine? Do not take this medicine with any of the following medications: -epoetin alfa This list may not describe all possible interactions. Give your health care provider a list of all the medicines, herbs, non-prescription drugs, or dietary supplements you use. Also tell them if you smoke, drink alcohol, or use illegal drugs. Some items may interact with your medicine. What should I watch for while using this medicine? Visit your prescriber or health care professional for regular checks on your progress and for the needed blood tests and blood pressure measurements. It is especially important for the doctor to make sure your hemoglobin level is in the desired range, to limit the risk of potential side effects and to give you the best benefit. Keep all appointments for any recommended tests. Check your blood pressure as directed. Ask your doctor what your blood pressure should be and when you should contact him or her. As your body makes more red blood cells, you may need to take iron, folic acid, or vitamin B supplements. Ask your doctor or health care provider which products are right for you. If you have kidney disease continue dietary restrictions, even though this medication can make you feel better. Talk with your doctor or health   care professional about the foods you eat and the vitamins that you take. What side effects may I notice from receiving this medicine? Side effects that you should report to your doctor or health care professional as soon as possible: -allergic reactions like skin rash, itching or hives, swelling of the face, lips, or tongue -breathing problems -changes in vision -chest  pain -confusion, trouble speaking or understanding -feeling faint or lightheaded, falls -high blood pressure -muscle aches or pains -pain, swelling, warmth in the leg -rapid weight gain -severe headaches -sudden numbness or weakness of the face, arm or leg -trouble walking, dizziness, loss of balance or coordination -seizures (convulsions) -swelling of the ankles, feet, hands -unusually weak or tired Side effects that usually do not require medical attention (report to your doctor or health care professional if they continue or are bothersome): -diarrhea -fever, chills (flu-like symptoms) -headaches -nausea, vomiting -redness, stinging, or swelling at site where injected This list may not describe all possible side effects. Call your doctor for medical advice about side effects. You may report side effects to FDA at 1-800-FDA-1088. Where should I keep my medicine? Keep out of the reach of children. Store in a refrigerator between 2 and 8 degrees C (36 and 46 degrees F). Do not freeze. Do not shake. Throw away any unused portion if using a single-dose vial. Throw away any unused medicine after the expiration date. NOTE: This sheet is a summary. It may not cover all possible information. If you have questions about this medicine, talk to your doctor, pharmacist, or health care provider.  2015, [employer]. (2008-10-19 10:23:57)  

## 2014-06-21 NOTE — Progress Notes (Signed)
[facility]  [phone] [phone]  ID: AMAHRI DENGEL OB: 06-26-1932 MR#: [id number] JYN#:[id number] Patient Care Team: Lisabeth Pick, MD as PCP - General Ladene Artist, MD as Referring Physician (Gastroenterology)  DIAGNOSIS: Anemia of renal insufficiency  Refractory anemia-low-grade  Pernicious anemia  INTERVAL HISTORY: Mr. Laminack is back today for a follow-up. He states that he is feeling great and has plenty of energy. His appetite is good. He has some stress over some financial issues. He is worried about his Aranesp cost. I spoke with Baxter Flattery and she is going to meet with him after his appointment today. His Hgb today is 8.4 so he will et his Aranesp. He denies fever, chills, cough, rash, headache, SOB, chest pain, palpitations, abdominal pain, constipations, diarrhea, blood in urine or stool. He denies swelling, tenderness, numbness or tingling in his extremities. He's had chronic back issues but these have been doing fairly well. His last iron studies back in July ferritin 516 with an iron saturation of 69%. He did have a CT scan done in May. He had a stable, tiny pleural nodules. These were noted a year ago.  CURRENT TREATMENT: Aranesp 300 mcg subcutaneous as needed for him level less than 10  REVIEW OF SYSTEMS: All other 10 point review of systems is negative except for those issues mentioned above.   PAST MEDICAL HISTORY: Past Medical History  Diagnosis Date  . CAD (coronary artery disease)   . GERD (gastroesophageal reflux disease)   . Hyperlipidemia   . Hypertension   . BPH (benign prostatic hyperplasia)   . CVD (cardiovascular disease)   . Adenomatous colon polyp 02/1996, 02/2011    TA polyp 02/2011  . B12 deficiency   . Anemia in chronic renal disease 11/02/2011  . Esophageal stricture   . Diverticulosis   . Hemorrhoids   . MDS (myelodysplastic syndrome)   . Glaucoma   . Heart palpitations   . Hiatal hernia    PAST SURGICAL  HISTORY: Past Surgical History  Procedure Laterality Date  . Carotid endarterectomy    . Ptca      stent  . Quadriceps repair      muscle attachment  . Colonoscopy    . Inguinal hernia repair      right side/ twice  . Tonsillectomy    . Glaucoma surgery Bilateral 11/19/12    pt's report   FAMILY HISTORY Family History  Problem Relation Age of Onset  . COPD Mother   . Alcohol abuse Father    GYNECOLOGIC HISTORY:  No LMP for male patient.   SOCIAL HISTORY:  History   Social History  . Marital Status: Married    Spouse Name: N/A    Number of Children: 3  . Years of Education: N/A   Occupational History  . Retired    Social History Main Topics  . Smoking status: Never Smoker   . Smokeless tobacco: Never Used     Comment: never used tobacco  . Alcohol Use: 3.5 oz/week    7 drink(s) per week  . Drug Use: No  . Sexual Activity: Not on file   Other Topics Concern  . Not on file   Social History Narrative  . No narrative on file   ADVANCED DIRECTIVES: <no information>  HEALTH MAINTENANCE: History  Substance Use Topics  . Smoking status: Never Smoker   . Smokeless tobacco: Never Used     Comment: never used tobacco  . Alcohol Use: 3.5 oz/week  7 drink(s) per week   Colonoscopy: PAP: Bone density: Lipid panel:  No Known Allergies  Current Outpatient Prescriptions  Medication Sig Dispense Refill  . aspirin 81 MG tablet Take 81 mg by mouth daily.        . Cyanocobalamin (VITAMIN B 12 PO) Inject as directed every 30 (thirty) days.      . dorzolamide (TRUSOPT) 2 % ophthalmic solution Place 1 drop into both eyes 2 (two) times daily.        [name] doxazosin (CARDURA) 8 MG tablet Take 1 tablet (8 mg total) by mouth at bedtime.  90 tablet  3  . isosorbide mononitrate (IMDUR) 30 MG 24 hr tablet Take 1 tablet (30 mg total) by mouth daily.  90 tablet  3  . LUMIGAN 0.01 % SOLN Place 1 drop into both eyes daily.      . Multiple Vitamin (MULTIVITAMIN) tablet Take 1  tablet by mouth daily.        [name] omeprazole (PRILOSEC) 20 MG capsule Take 1 capsule (20 mg total) by mouth daily.  90 capsule  3  . oxybutynin (DITROPAN) 5 MG tablet Take 5 mg by mouth 2 (two) times daily. Patient states he takes once daily.      . potassium chloride SA (K-DUR,KLOR-CON) 20 MEQ tablet Take 1 tablet (20 mEq total) by mouth daily.  90 tablet  3  . simvastatin (ZOCOR) 40 MG tablet Take 1 tablet by mouth  every night at bedtime  90 tablet  1  . nitroGLYCERIN (NITROSTAT) 0.4 MG SL tablet Place 0.4 mg under the tongue every 5 (five) minutes as needed.         No current facility-administered medications for this visit.   Facility-Administered Medications Ordered in Other Visits  Medication Dose Route Frequency Provider Last Rate Last Dose  . darbepoetin (ARANESP) injection 300 mcg  300 mcg Subcutaneous Once [name], MD      . darbepoetin Kyra Searles) injection 300 mcg  300 mcg Subcutaneous Q28 days Helayne Seminole, PA-C   300 mcg at 01/18/14 1135  . darbepoetin (ARANESP) injection 300 mcg  300 mcg Subcutaneous Q28 days Helayne Seminole, PA-C       OBJECTIVE: Filed Vitals:   06/21/14 1345  BP: 171/62  Pulse: 65  Temp: 97.9 F (36.6 C)  Resp: 18   Body mass index is 27.96 kg/(m^2). ECOG FS:0 - Asymptomatic Ocular: Sclerae unicteric, pupils equal, round and reactive to light Ear-nose-throat: Oropharynx clear, dentition fair Lymphatic: No cervical or supraclavicular adenopathy Lungs no rales or rhonchi, good excursion bilaterally Heart regular rate and rhythm, no murmur appreciated Abd soft, nontender, positive bowel sounds MSK no focal spinal tenderness, no joint edema Neuro: non-focal, well-oriented, appropriate affect Breasts: Deferred  LAB RESULTS: CMP     Component Value Date/Time   NA 141 06/21/2014 1323   NA 132* 07/21/2013 1010   K 4.3 06/21/2014 1323   K 4.3 07/21/2013 1010   CL 104 06/21/2014 1323   CL 103 07/21/2013 1010   CO2 26 06/21/2014 1323   CO2 24 07/21/2013 1010    GLUCOSE 90 06/21/2014 1323   GLUCOSE 116* 07/21/2013 1010   BUN 20 06/21/2014 1323   BUN 27* 07/21/2013 1010   CREATININE 1.2 06/21/2014 1323   CREATININE 1.3 07/21/2013 1010   CALCIUM 8.1 06/21/2014 1323   CALCIUM 8.6 07/21/2013 1010   PROT 5.9* 06/21/2014 1323   PROT 6.3 07/21/2013 1010   ALBUMIN 4.2 07/21/2013 1010   AST 22 06/21/2014  1323   AST 17 07/21/2013 1010   ALT 15 06/21/2014 1323   ALT 14 07/21/2013 1010   ALKPHOS 36 06/21/2014 1323   ALKPHOS 36* 07/21/2013 1010   BILITOT 0.90 06/21/2014 1323   BILITOT 0.7 07/21/2013 1010   GFRNONAA >60 04/26/2011 0445   GFRAA  Value: >60        The eGFR has been calculated using the MDRD equation. This calculation has not been validated in all clinical situations. eGFR's persistently <60 mL/min signify possible Chronic Kidney Disease. 04/26/2011 0445   No results found for this basename: SPEP, UPEP,  kappa and lambda light chains   Lab Results  Component Value Date   WBC 4.2 06/21/2014   NEUTROABS 2.5 06/21/2014   HGB 8.4* 06/21/2014   HCT 24.8* 06/21/2014   MCV 108* 06/21/2014   PLT 431* 06/21/2014   No results found for this basename: LABCA2   No components found with this basename: [id number]   No results found for this basename: INR,  in the last 168 hours  STUDIES: No results found.  ASSESSMENT/PLAN: Mr. Bucklew is pleasant 80 year old gentleman with low-grade myelodysplasia. He has refractory anemia. He also has pernicious anemia and anemia of renal insufficiency.  We will go ahead and give him Aranesp today. We will see him back for labs and follow-up in 1 month. All questions were answered and he is in agreement with the plan. He knows to call here with any questions, we can certainly see him sooner if need be.  Eliezer Bottom, NP 06/21/2014 2:29 PM

## 2014-06-22 ENCOUNTER — Ambulatory Visit (INDEPENDENT_AMBULATORY_CARE_PROVIDER_SITE_OTHER): Payer: Medicare Other | Admitting: *Deleted

## 2014-06-22 DIAGNOSIS — E538 Deficiency of other specified B group vitamins: Secondary | ICD-10-CM | POA: Diagnosis not present

## 2014-06-22 MED ORDER — CYANOCOBALAMIN 1000 MCG/ML IJ SOLN
1000.0000 ug | Freq: Once | INTRAMUSCULAR | Status: AC
  Administered 2014-06-22: 1000 ug via INTRAMUSCULAR

## 2014-06-24 LAB — RETICULOCYTES (CHCC)
ABS RETIC: 20.8 10*3/uL (ref 19.0–186.0)
RBC.: 2.31 MIL/uL — ABNORMAL LOW (ref 4.22–5.81)
RETIC CT PCT: 0.9 % (ref 0.4–2.3)

## 2014-06-24 LAB — SOLUBLE TRANSFERRIN RECEPTOR: Transferrin Receptor, Soluble: 2.03 mg/L — ABNORMAL HIGH (ref 0.76–1.76)

## 2014-06-24 LAB — ERYTHROPOIETIN: Erythropoietin: 58.6 m[IU]/mL — ABNORMAL HIGH (ref 2.6–18.5)

## 2014-07-05 ENCOUNTER — Telehealth: Payer: Self-pay | Admitting: Internal Medicine

## 2014-07-05 MED ORDER — ISOSORBIDE MONONITRATE ER 30 MG PO TB24
30.0000 mg | ORAL_TABLET | Freq: Every day | ORAL | Status: DC
Start: 2014-07-05 — End: 2015-01-06

## 2014-07-05 NOTE — Telephone Encounter (Signed)
[facility] is requesting re-fill on isosorbide mononitrate (IMDUR) 30 MG 24 hr tablet

## 2014-07-05 NOTE — Telephone Encounter (Signed)
90 day supply sent in electronically to [facility].  Pt needs to establish with a new physician.  Please schedule

## 2014-07-05 NOTE — Telephone Encounter (Signed)
Pt is scheduled for 12/01/14.

## 2014-07-07 ENCOUNTER — Encounter: Payer: Self-pay | Admitting: Gastroenterology

## 2014-07-14 ENCOUNTER — Telehealth: Payer: Self-pay | Admitting: Internal Medicine

## 2014-07-14 NOTE — Telephone Encounter (Signed)
Pt requesting to speak with Dr. Leanne Chang regarding questions he has about the new [facility] plan.  I informed pt Dr. Leanne Chang is not available and if I could help him.  Pt states only Dr. Leanne Chang can help him and he needs his telephone number.  Informed pt I would send a message back on his behalf as I would not be able to provide a telephone number for Dr. Leanne Chang.  Please assist pt with his questions.

## 2014-07-16 NOTE — Telephone Encounter (Signed)
No answer, no machine.

## 2014-07-20 ENCOUNTER — Encounter: Payer: Self-pay | Admitting: Hematology & Oncology

## 2014-07-20 ENCOUNTER — Ambulatory Visit: Payer: Medicare Other

## 2014-07-20 ENCOUNTER — Telehealth: Payer: Self-pay | Admitting: Hematology & Oncology

## 2014-07-20 ENCOUNTER — Other Ambulatory Visit (HOSPITAL_BASED_OUTPATIENT_CLINIC_OR_DEPARTMENT_OTHER): Payer: Medicare Other | Admitting: Lab

## 2014-07-20 ENCOUNTER — Ambulatory Visit (HOSPITAL_BASED_OUTPATIENT_CLINIC_OR_DEPARTMENT_OTHER): Payer: Medicare Other | Admitting: Hematology & Oncology

## 2014-07-20 VITALS — BP 171/62 | HR 63 | Temp 97.9°F | Resp 18 | Ht 65.0 in | Wt 168.0 lb

## 2014-07-20 DIAGNOSIS — D4620 Refractory anemia with excess of blasts, unspecified: Secondary | ICD-10-CM

## 2014-07-20 DIAGNOSIS — N189 Chronic kidney disease, unspecified: Secondary | ICD-10-CM | POA: Diagnosis not present

## 2014-07-20 DIAGNOSIS — D510 Vitamin B12 deficiency anemia due to intrinsic factor deficiency: Secondary | ICD-10-CM | POA: Diagnosis not present

## 2014-07-20 DIAGNOSIS — D649 Anemia, unspecified: Secondary | ICD-10-CM | POA: Diagnosis not present

## 2014-07-20 DIAGNOSIS — D518 Other vitamin B12 deficiency anemias: Secondary | ICD-10-CM

## 2014-07-20 DIAGNOSIS — D631 Anemia in chronic kidney disease: Secondary | ICD-10-CM

## 2014-07-20 DIAGNOSIS — D469 Myelodysplastic syndrome, unspecified: Secondary | ICD-10-CM

## 2014-07-20 LAB — CBC WITH DIFFERENTIAL (CANCER CENTER ONLY)
BASO#: 0 10*3/uL (ref 0.0–0.2)
BASO%: 0.5 % (ref 0.0–2.0)
EOS%: 4.9 % (ref 0.0–7.0)
Eosinophils Absolute: 0.2 10*3/uL (ref 0.0–0.5)
HEMATOCRIT: 26.7 % — AB (ref 38.7–49.9)
HGB: 8.9 g/dL — ABNORMAL LOW (ref 13.0–17.1)
LYMPH#: 1.2 10*3/uL (ref 0.9–3.3)
LYMPH%: 29.5 % (ref 14.0–48.0)
MCH: 36.6 pg — ABNORMAL HIGH (ref 28.0–33.4)
MCHC: 33.3 g/dL (ref 32.0–35.9)
MCV: 110 fL — AB (ref 82–98)
MONO#: 0.3 10*3/uL (ref 0.1–0.9)
MONO%: 8 % (ref 0.0–13.0)
NEUT#: 2.3 10*3/uL (ref 1.5–6.5)
NEUT%: 57.1 % (ref 40.0–80.0)
Platelets: 437 10*3/uL — ABNORMAL HIGH (ref 145–400)
RBC: 2.43 10*6/uL — ABNORMAL LOW (ref 4.20–5.70)
RDW: 16.1 % — ABNORMAL HIGH (ref 11.1–15.7)
WBC: 4.1 10*3/uL (ref 4.0–10.0)

## 2014-07-20 LAB — CHCC SATELLITE - SMEAR

## 2014-07-20 MED ORDER — DARBEPOETIN ALFA-POLYSORBATE 300 MCG/0.6ML IJ SOLN
INTRAMUSCULAR | Status: AC
  Filled 2014-07-20: qty 0.6

## 2014-07-20 NOTE — Progress Notes (Signed)
Patient comes in today, CBC checked, Aranesp injection not given.  Hgb was 8.9.  Patient made aware, seen by Dr. Marin Olp.

## 2014-07-20 NOTE — Telephone Encounter (Signed)
Pt's   has applied for the [place] Program.     Sending application via enter ofce mail today to:         [facility] [employer]                                                                                       Customer Service                                                                                       [place], [place]  [address]                     The [facility] Settlement Policy provides an opportunity for patients to request discounts on balances due to the hospital in excess of $5,000.00. This policy applies to insured patients who may also experience a financial hardship when paying their balance after all third party payments.  Pt also has two insurance. However, he advised his bills are over $5000. I also called the billing ofc and they said to send to: [facility] @ [address].

## 2014-07-20 NOTE — Progress Notes (Signed)
Hematology and Oncology Follow Up Visit  Alexander Rice [id number] September 14, 1932 80 y.o. 07/20/2014   Principle Diagnosis:   Anemia of renal insufficiency  Refractory anemia-low-grade  Pernicious anemia  Current Therapy:   Aranesp 300 mcg subcutaneous as needed for hemoglobin less than 10     Interim History:  Mr.  Rice is back for followup. He's doing quite well. He is responding a little better to the Aranesp.  Is not as fatigued. He is not as short of breath. He's had chronic back issues but these have been doing fairly well.  He recently was up in [place] state. [place] is about [place]. Get a really good time. [name] that he was in that traffic because [place]  He's had no leg swelling. He's had no rashes. He's had no fever.  His last iron studies back in July show a ferritin 516 with an iron saturation of 69 %.  In May, and his erythropoietin level was 29.   He did have a CT scan done in May. He had a stable, tiny pleural nodules. These were noted a year ago.   Medications: Current outpatient prescriptions:aspirin 81 MG tablet, Take 81 mg by mouth daily.  , Disp: , Rfl: ;  Cyanocobalamin (VITAMIN B 12 PO), Inject as directed every 30 (thirty) days., Disp: , Rfl: ;  dorzolamide (TRUSOPT) 2 % ophthalmic solution, Place 1 drop into both eyes 2 (two) times daily.  , Disp: , Rfl: ;  doxazosin (CARDURA) 8 MG tablet, Take 1 tablet (8 mg total) by mouth at bedtime., Disp: 90 tablet, Rfl: 3 isosorbide mononitrate (IMDUR) 30 MG 24 hr tablet, Take 1 tablet (30 mg total) by mouth daily., Disp: 90 tablet, Rfl: 0;  LUMIGAN 0.01 % SOLN, Place 1 drop into both eyes daily., Disp: , Rfl: ;  Multiple Vitamin (MULTIVITAMIN) tablet, Take 1 tablet by mouth daily.  , Disp: , Rfl: ;  nitroGLYCERIN (NITROSTAT) 0.4 MG SL tablet, Place 0.4 mg under the tongue every 5 (five) minutes as needed.  , Disp: , Rfl:  omeprazole (PRILOSEC) 20 MG  capsule, Take 1 capsule (20 mg total) by mouth daily., Disp: 90 capsule, Rfl: 3;  oxybutynin (DITROPAN) 5 MG tablet, Take 5 mg by mouth 2 (two) times daily. Patient states he takes once daily., Disp: , Rfl: ;  potassium chloride SA (K-DUR,KLOR-CON) 20 MEQ tablet, Take 1 tablet (20 mEq total) by mouth daily., Disp: 90 tablet, Rfl: 3 simvastatin (ZOCOR) 40 MG tablet, Take 1 tablet by mouth  every night at bedtime, Disp: 90 tablet, Rfl: 1 No current facility-administered medications for this visit. Facility-Administered Medications Ordered in Other Visits: darbepoetin (ARANESP) injection 300 mcg, 300 mcg, Subcutaneous, Once, Alexander Napoleon, MD;  darbepoetin Alexander Rice) injection 300 mcg, 300 mcg, Subcutaneous, Q28 days, Alexander Seminole, PA-C, 300 mcg at 01/18/14 1135  Allergies: No Known Allergies  Past Medical History, Surgical history, Social history, and Family History were reviewed and updated.  Review of Systems: As above  Physical Exam:  height is 5\' 5"  (1.651 m) and weight is 168 lb (76.204 kg). His oral temperature is 97.9 F (36.6 C). His blood pressure is 171/62 and his pulse is 63. His respiration is 18.   Thin, elderly white woman. There's kyphosis of the spine. His lungs are clear. Cardiac exam regular in rhythm. He is aware 6 systolic murmur. Abdomen is soft. It is no palpable liver or spleen tip. Extremities  shows age-related changes in his joints. He has good strength. His mild edema, cycles. Skin exam no rashes. Neurological exam is nonfocal.  Lab Results  Component Value Date   WBC 4.1 07/20/2014   HGB 8.9* 07/20/2014   HCT 26.7* 07/20/2014   MCV 110* 07/20/2014   PLT 437* 07/20/2014     Chemistry      Component Value Date/Time   NA 141 06/21/2014 1323   NA 132* 07/21/2013 1010   K 4.3 06/21/2014 1323   K 4.3 07/21/2013 1010   CL 104 06/21/2014 1323   CL 103 07/21/2013 1010   CO2 26 06/21/2014 1323   CO2 24 07/21/2013 1010   BUN 20 06/21/2014 1323   BUN 27* 07/21/2013 1010   CREATININE 1.2  06/21/2014 1323   CREATININE 1.3 07/21/2013 1010      Component Value Date/Time   CALCIUM 8.1 06/21/2014 1323   CALCIUM 8.6 07/21/2013 1010   ALKPHOS 36 06/21/2014 1323   ALKPHOS 36* 07/21/2013 1010   AST 22 06/21/2014 1323   AST 17 07/21/2013 1010   ALT 15 06/21/2014 1323   ALT 14 07/21/2013 1010   BILITOT 0.90 06/21/2014 1323   BILITOT 0.7 07/21/2013 1010         Impression and Plan: Alexander Rice is 80 year old gentleman with low-grade myelodysplasia. He has refractory anemia. He also has pernicious anemia and anemia of renal insufficiency.  She feels very well, we'll hold off on the Aranesp today. Urinalysis hemoglobin is less than 10, he's would not September now. As such, I really think we can just watch him.  I suspect that he may need some Aranesp with his next visit. We will have him come back in one month. Alexander Napoleon, MD 9/1/201511:26 AM

## 2014-07-23 LAB — COMPREHENSIVE METABOLIC PANEL
ALT: 15 U/L (ref 0–53)
AST: 18 U/L (ref 0–37)
Albumin: 4 g/dL (ref 3.5–5.2)
Alkaline Phosphatase: 43 U/L (ref 39–117)
BUN: 24 mg/dL — AB (ref 6–23)
CALCIUM: 8.6 mg/dL (ref 8.4–10.5)
CHLORIDE: 110 meq/L (ref 96–112)
CO2: 24 meq/L (ref 19–32)
CREATININE: 1.18 mg/dL (ref 0.50–1.35)
Glucose, Bld: 91 mg/dL (ref 70–99)
Potassium: 4.4 mEq/L (ref 3.5–5.3)
Sodium: 140 mEq/L (ref 135–145)
Total Bilirubin: 0.8 mg/dL (ref 0.2–1.2)
Total Protein: 5.7 g/dL — ABNORMAL LOW (ref 6.0–8.3)

## 2014-07-23 LAB — ERYTHROPOIETIN: Erythropoietin: 31.2 m[IU]/mL — ABNORMAL HIGH (ref 2.6–18.5)

## 2014-07-23 LAB — RETICULOCYTES (CHCC)
ABS RETIC: 20.1 10*3/uL (ref 19.0–186.0)
RBC.: 2.51 MIL/uL — AB (ref 4.22–5.81)
Retic Ct Pct: 0.8 % (ref 0.4–2.3)

## 2014-07-23 LAB — SOLUBLE TRANSFERRIN RECEPTOR: Transferrin Receptor, Soluble: 1.9 mg/L — ABNORMAL HIGH (ref 0.76–1.76)

## 2014-07-30 ENCOUNTER — Other Ambulatory Visit: Payer: Self-pay | Admitting: Internal Medicine

## 2014-08-04 ENCOUNTER — Ambulatory Visit (INDEPENDENT_AMBULATORY_CARE_PROVIDER_SITE_OTHER): Payer: Medicare Other | Admitting: *Deleted

## 2014-08-04 DIAGNOSIS — E538 Deficiency of other specified B group vitamins: Secondary | ICD-10-CM | POA: Diagnosis not present

## 2014-08-04 MED ORDER — CYANOCOBALAMIN 1000 MCG/ML IJ SOLN
1000.0000 ug | Freq: Once | INTRAMUSCULAR | Status: AC
  Administered 2014-08-04: 1000 ug via INTRAMUSCULAR

## 2014-08-19 DIAGNOSIS — M79674 Pain in right toe(s): Secondary | ICD-10-CM | POA: Diagnosis not present

## 2014-08-19 DIAGNOSIS — B351 Tinea unguium: Secondary | ICD-10-CM | POA: Diagnosis not present

## 2014-08-20 ENCOUNTER — Ambulatory Visit (HOSPITAL_BASED_OUTPATIENT_CLINIC_OR_DEPARTMENT_OTHER): Payer: Medicare Other

## 2014-08-20 ENCOUNTER — Other Ambulatory Visit (HOSPITAL_BASED_OUTPATIENT_CLINIC_OR_DEPARTMENT_OTHER): Payer: Medicare Other | Admitting: Lab

## 2014-08-20 ENCOUNTER — Encounter: Payer: Self-pay | Admitting: Hematology & Oncology

## 2014-08-20 ENCOUNTER — Ambulatory Visit (HOSPITAL_BASED_OUTPATIENT_CLINIC_OR_DEPARTMENT_OTHER): Payer: Medicare Other | Admitting: Hematology & Oncology

## 2014-08-20 VITALS — BP 161/56 | HR 79 | Temp 95.0°F | Resp 20 | Wt 168.0 lb

## 2014-08-20 DIAGNOSIS — D469 Myelodysplastic syndrome, unspecified: Secondary | ICD-10-CM

## 2014-08-20 DIAGNOSIS — N189 Chronic kidney disease, unspecified: Secondary | ICD-10-CM

## 2014-08-20 DIAGNOSIS — N289 Disorder of kidney and ureter, unspecified: Secondary | ICD-10-CM

## 2014-08-20 DIAGNOSIS — D631 Anemia in chronic kidney disease: Secondary | ICD-10-CM | POA: Diagnosis not present

## 2014-08-20 DIAGNOSIS — D649 Anemia, unspecified: Secondary | ICD-10-CM

## 2014-08-20 DIAGNOSIS — D510 Vitamin B12 deficiency anemia due to intrinsic factor deficiency: Secondary | ICD-10-CM | POA: Diagnosis not present

## 2014-08-20 DIAGNOSIS — D464 Refractory anemia, unspecified: Secondary | ICD-10-CM | POA: Diagnosis not present

## 2014-08-20 DIAGNOSIS — D518 Other vitamin B12 deficiency anemias: Secondary | ICD-10-CM

## 2014-08-20 LAB — CBC WITH DIFFERENTIAL (CANCER CENTER ONLY)
BASO#: 0 10*3/uL (ref 0.0–0.2)
BASO%: 0.2 % (ref 0.0–2.0)
EOS ABS: 0.1 10*3/uL (ref 0.0–0.5)
EOS%: 1.5 % (ref 0.0–7.0)
HEMATOCRIT: 25.5 % — AB (ref 38.7–49.9)
HEMOGLOBIN: 8.6 g/dL — AB (ref 13.0–17.1)
LYMPH#: 1.3 10*3/uL (ref 0.9–3.3)
LYMPH%: 26.3 % (ref 14.0–48.0)
MCH: 36.4 pg — ABNORMAL HIGH (ref 28.0–33.4)
MCHC: 33.7 g/dL (ref 32.0–35.9)
MCV: 108 fL — AB (ref 82–98)
MONO#: 0.3 10*3/uL (ref 0.1–0.9)
MONO%: 5.6 % (ref 0.0–13.0)
NEUT#: 3.2 10*3/uL (ref 1.5–6.5)
NEUT%: 66.4 % (ref 40.0–80.0)
Platelets: 376 10*3/uL (ref 145–400)
RBC: 2.36 10*6/uL — AB (ref 4.20–5.70)
RDW: 15.2 % (ref 11.1–15.7)
WBC: 4.8 10*3/uL (ref 4.0–10.0)

## 2014-08-20 LAB — RETICULOCYTES (CHCC)
ABS RETIC: 22.1 10*3/uL (ref 19.0–186.0)
RBC.: 2.45 MIL/uL — AB (ref 4.22–5.81)
Retic Ct Pct: 0.9 % (ref 0.4–2.3)

## 2014-08-20 LAB — IRON AND TIBC CHCC
%SAT: 99 % — AB (ref 20–55)
IRON: 186 ug/dL — AB (ref 42–163)
TIBC: 189 ug/dL — ABNORMAL LOW (ref 202–409)
UIBC: 3 ug/dL — AB (ref 117–376)

## 2014-08-20 LAB — CHCC SATELLITE - SMEAR

## 2014-08-20 LAB — FERRITIN CHCC: Ferritin: 520 ng/ml — ABNORMAL HIGH (ref 22–316)

## 2014-08-20 MED ORDER — DARBEPOETIN ALFA-POLYSORBATE 300 MCG/0.6ML IJ SOLN
300.0000 ug | INTRAMUSCULAR | Status: DC
  Administered 2014-08-20: 300 ug via SUBCUTANEOUS

## 2014-08-20 MED ORDER — DARBEPOETIN ALFA-POLYSORBATE 300 MCG/0.6ML IJ SOLN
INTRAMUSCULAR | Status: AC
  Filled 2014-08-20: qty 0.6

## 2014-08-20 NOTE — Patient Instructions (Signed)
Darbepoetin Alfa injection What is this medicine? DARBEPOETIN ALFA (dar be POE e tin AL fa) helps your body make more red blood cells. It is used to treat anemia caused by chronic kidney failure and chemotherapy. This medicine may be used for other purposes; ask your health care provider or pharmacist if you have questions. COMMON BRAND NAME(S): Aranesp What should I tell my health care provider before I take this medicine? They need to know if you have any of these conditions: -blood clotting disorders or history of blood clots -cancer patient not on chemotherapy -cystic fibrosis -heart disease, such as angina, heart failure, or a history of a heart attack -hemoglobin level of 12 g/dL or greater -high blood pressure -low levels of folate, iron, or vitamin B12 -seizures -an unusual or allergic reaction to darbepoetin, erythropoietin, albumin, hamster proteins, latex, other medicines, foods, dyes, or preservatives -pregnant or trying to get pregnant -breast-feeding How should I use this medicine? This medicine is for injection into a vein or under the skin. It is usually given by a health care professional in a hospital or clinic setting. If you get this medicine at home, you will be taught how to prepare and give this medicine. Do not shake the solution before you withdraw a dose. Use exactly as directed. Take your medicine at regular intervals. Do not take your medicine more often than directed. It is important that you put your used needles and syringes in a special sharps container. Do not put them in a trash can. If you do not have a sharps container, call your pharmacist or healthcare provider to get one. Talk to your pediatrician regarding the use of this medicine in children. While this medicine may be used in children as young as 1 year for selected conditions, precautions do apply. Overdosage: If you think you have taken too much of this medicine contact a poison control center or  emergency room at once. NOTE: This medicine is only for you. Do not share this medicine with others. What if I miss a dose? If you miss a dose, take it as soon as you can. If it is almost time for your next dose, take only that dose. Do not take double or extra doses. What may interact with this medicine? Do not take this medicine with any of the following medications: -epoetin alfa This list may not describe all possible interactions. Give your health care provider a list of all the medicines, herbs, non-prescription drugs, or dietary supplements you use. Also tell them if you smoke, drink alcohol, or use illegal drugs. Some items may interact with your medicine. What should I watch for while using this medicine? Visit your prescriber or health care professional for regular checks on your progress and for the needed blood tests and blood pressure measurements. It is especially important for the doctor to make sure your hemoglobin level is in the desired range, to limit the risk of potential side effects and to give you the best benefit. Keep all appointments for any recommended tests. Check your blood pressure as directed. Ask your doctor what your blood pressure should be and when you should contact him or her. As your body makes more red blood cells, you may need to take iron, folic acid, or vitamin B supplements. Ask your doctor or health care provider which products are right for you. If you have kidney disease continue dietary restrictions, even though this medication can make you feel better. Talk with your doctor or health   care professional about the foods you eat and the vitamins that you take. What side effects may I notice from receiving this medicine? Side effects that you should report to your doctor or health care professional as soon as possible: -allergic reactions like skin rash, itching or hives, swelling of the face, lips, or tongue -breathing problems -changes in vision -chest  pain -confusion, trouble speaking or understanding -feeling faint or lightheaded, falls -high blood pressure -muscle aches or pains -pain, swelling, warmth in the leg -rapid weight gain -severe headaches -sudden numbness or weakness of the face, arm or leg -trouble walking, dizziness, loss of balance or coordination -seizures (convulsions) -swelling of the ankles, feet, hands -unusually weak or tired Side effects that usually do not require medical attention (report to your doctor or health care professional if they continue or are bothersome): -diarrhea -fever, chills (flu-like symptoms) -headaches -nausea, vomiting -redness, stinging, or swelling at site where injected This list may not describe all possible side effects. Call your doctor for medical advice about side effects. You may report side effects to FDA at 1-800-FDA-1088. Where should I keep my medicine? Keep out of the reach of children. Store in a refrigerator between 2 and 8 degrees C (36 and 46 degrees F). Do not freeze. Do not shake. Throw away any unused portion if using a single-dose vial. Throw away any unused medicine after the expiration date. NOTE: This sheet is a summary. It may not cover all possible information. If you have questions about this medicine, talk to your doctor, pharmacist, or health care provider.  2015, [employer]. (2008-10-19 10:23:57)  

## 2014-08-20 NOTE — Progress Notes (Signed)
Hematology and Oncology Follow Up Visit  Alexander Rice [id number] [birth year] 80 y.o. 08/20/2014   Principle Diagnosis:   Anemia of renal insufficiency  Refractory anemia-low-grade  Pernicious anemia  Current Therapy:   Aranesp 300 mcg subcutaneous as needed for hemoglobin less than 10     Interim History:  [name] is back for followup. He's doing quite well. Has no new complaints.  Is not as fatigued. He is not as short of breath. He's had chronic back issues but these have been doing fairly well.   He's had no leg swelling. He's had no rashes. He's had no fever.  In May, and his erythropoietin level was 29.   He did have a CT scan done in May. He had a stable, tiny pleural nodules. These were noted a year ago.   Medications: Current outpatient prescriptions:aspirin 81 MG tablet, Take 81 mg by mouth daily.  , Disp: , Rfl: ;  Cyanocobalamin (VITAMIN B 12 PO), Inject as directed every 30 (thirty) days., Disp: , Rfl: ;  dorzolamide (TRUSOPT) 2 % ophthalmic solution, Place 1 drop into both eyes 2 (two) times daily.  , Disp: , Rfl: ;  doxazosin (CARDURA) 8 MG tablet, Take 1 tablet (8 mg total) by mouth at bedtime., Disp: 90 tablet, Rfl: 3 isosorbide mononitrate (IMDUR) 30 MG 24 hr tablet, Take 1 tablet (30 mg total) by mouth daily., Disp: 90 tablet, Rfl: 0;  LUMIGAN 0.01 % SOLN, Place 1 drop into both eyes daily., Disp: , Rfl: ;  Multiple Vitamin (MULTIVITAMIN) tablet, Take 1 tablet by mouth daily.  , Disp: , Rfl: ;  nitroGLYCERIN (NITROSTAT) 0.4 MG SL tablet, Place 0.4 mg under the tongue every 5 (five) minutes as needed.  , Disp: , Rfl:  omeprazole (PRILOSEC) 20 MG capsule, Take 1 capsule by mouth  daily, Disp: 90 capsule, Rfl: 0;  oxybutynin (DITROPAN) 5 MG tablet, Take 5 mg by mouth 2 (two) times daily. Patient states he takes once daily., Disp: , Rfl: ;  potassium chloride SA (K-DUR,KLOR-CON) 20 MEQ tablet, Take 1 tablet (20 mEq total) by mouth daily., Disp: 90 tablet, Rfl:  3 simvastatin (ZOCOR) 40 MG tablet, Take 1 tablet by mouth  every night at bedtime, Disp: 90 tablet, Rfl: 1 No current facility-administered medications for this visit. Facility-Administered Medications Ordered in Other Visits: darbepoetin (ARANESP) injection 300 mcg, 300 mcg, Subcutaneous, Once, [name], MD;  darbepoetin Kyra Searles) injection 300 mcg, 300 mcg, Subcutaneous, Q28 days, Helayne Seminole, PA-C, 300 mcg at 01/18/14 1135  Allergies: No Known Allergies  Past Medical History, Surgical history, Social history, and Family History were reviewed and updated.  Review of Systems: As above  Physical Exam:  weight is 168 lb (76.204 kg). His temperature is 95 F (35 C). His blood pressure is 161/56 and his pulse is 79. His respiration is 20.   Thin, elderly white woman. There's kyphosis of the spine. His lungs are clear. Cardiac exam regular in rhythm. He is aware 6 systolic murmur. Abdomen is soft. It is no palpable liver or spleen tip. Extremities shows age-related changes in his joints. He has good strength. His mild edema, cycles. Skin exam no rashes. Neurological exam is nonfocal.  Lab Results  Component Value Date   WBC 4.8 08/20/2014   HGB 8.6* 08/20/2014   HCT 25.5* 08/20/2014   MCV 108* 08/20/2014   PLT 376 08/20/2014     Chemistry      Component Value Date/Time   NA 140 07/20/2014  1020   NA 141 06/21/2014 1323   K 4.4 07/20/2014 1020   K 4.3 06/21/2014 1323   CL 110 07/20/2014 1020   CL 104 06/21/2014 1323   CO2 24 07/20/2014 1020   CO2 26 06/21/2014 1323   BUN 24* 07/20/2014 1020   BUN 20 06/21/2014 1323   CREATININE 1.18 07/20/2014 1020   CREATININE 1.2 06/21/2014 1323      Component Value Date/Time   CALCIUM 8.6 07/20/2014 1020   CALCIUM 8.1 06/21/2014 1323   ALKPHOS 43 07/20/2014 1020   ALKPHOS 36 06/21/2014 1323   AST 18 07/20/2014 1020   AST 22 06/21/2014 1323   ALT 15 07/20/2014 1020   ALT 15 06/21/2014 1323   BILITOT 0.8 07/20/2014 1020   BILITOT 0.90 06/21/2014 1323      Ferritin is 520.  His iron saturation is 99%.  Impression and Plan: Alexander Rice is 80 year old gentleman with low-grade myelodysplasia. He has refractory anemia. He also has pernicious anemia and anemia of renal insufficiency.  He feels very well, however, we will go ahead and give him a dose of Aranesp today. I think this may help him. With the weather getting a little bit colder, it might be nice to try to get his blood count a little better.  We will have him come back in one month.  [name], MD 10/2/20155:50 PM

## 2014-09-17 ENCOUNTER — Ambulatory Visit (HOSPITAL_BASED_OUTPATIENT_CLINIC_OR_DEPARTMENT_OTHER): Payer: Medicare Other | Admitting: Family

## 2014-09-17 ENCOUNTER — Other Ambulatory Visit (HOSPITAL_BASED_OUTPATIENT_CLINIC_OR_DEPARTMENT_OTHER): Payer: Medicare Other | Admitting: Lab

## 2014-09-17 ENCOUNTER — Encounter: Payer: Self-pay | Admitting: Hematology & Oncology

## 2014-09-17 ENCOUNTER — Ambulatory Visit (HOSPITAL_BASED_OUTPATIENT_CLINIC_OR_DEPARTMENT_OTHER): Payer: Medicare Other

## 2014-09-17 VITALS — BP 165/72 | HR 70 | Temp 97.7°F | Resp 18 | Wt 168.0 lb

## 2014-09-17 DIAGNOSIS — N189 Chronic kidney disease, unspecified: Secondary | ICD-10-CM

## 2014-09-17 DIAGNOSIS — D631 Anemia in chronic kidney disease: Secondary | ICD-10-CM | POA: Diagnosis not present

## 2014-09-17 DIAGNOSIS — D518 Other vitamin B12 deficiency anemias: Secondary | ICD-10-CM | POA: Diagnosis not present

## 2014-09-17 DIAGNOSIS — D469 Myelodysplastic syndrome, unspecified: Secondary | ICD-10-CM | POA: Diagnosis not present

## 2014-09-17 LAB — CMP (CANCER CENTER ONLY)
ALT(SGPT): 13 U/L (ref 10–47)
AST: 19 U/L (ref 11–38)
Albumin: 3.4 g/dL (ref 3.3–5.5)
Alkaline Phosphatase: 52 U/L (ref 26–84)
BUN, Bld: 24 mg/dL — ABNORMAL HIGH (ref 7–22)
CO2: 24 mEq/L (ref 18–33)
Calcium: 8.4 mg/dL (ref 8.0–10.3)
Chloride: 104 mEq/L (ref 98–108)
Creat: 1.1 mg/dl (ref 0.6–1.2)
Glucose, Bld: 118 mg/dL (ref 73–118)
Potassium: 3.8 mEq/L (ref 3.3–4.7)
Sodium: 141 mEq/L (ref 128–145)
Total Bilirubin: 0.7 mg/dl (ref 0.20–1.60)
Total Protein: 5.9 g/dL — ABNORMAL LOW (ref 6.4–8.1)

## 2014-09-17 LAB — CBC WITH DIFFERENTIAL (CANCER CENTER ONLY)
BASO#: 0 10*3/uL (ref 0.0–0.2)
BASO%: 0.3 % (ref 0.0–2.0)
EOS%: 1.8 % (ref 0.0–7.0)
Eosinophils Absolute: 0.1 10*3/uL (ref 0.0–0.5)
HCT: 25.4 % — ABNORMAL LOW (ref 38.7–49.9)
HEMOGLOBIN: 8.6 g/dL — AB (ref 13.0–17.1)
LYMPH#: 1.2 10*3/uL (ref 0.9–3.3)
LYMPH%: 29.7 % (ref 14.0–48.0)
MCH: 36.9 pg — AB (ref 28.0–33.4)
MCHC: 33.9 g/dL (ref 32.0–35.9)
MCV: 109 fL — ABNORMAL HIGH (ref 82–98)
MONO#: 0.4 10*3/uL (ref 0.1–0.9)
MONO%: 9 % (ref 0.0–13.0)
NEUT#: 2.3 10*3/uL (ref 1.5–6.5)
NEUT%: 59.2 % (ref 40.0–80.0)
Platelets: 445 10*3/uL — ABNORMAL HIGH (ref 145–400)
RBC: 2.33 10*6/uL — ABNORMAL LOW (ref 4.20–5.70)
RDW: 16 % — ABNORMAL HIGH (ref 11.1–15.7)
WBC: 3.9 10*3/uL — ABNORMAL LOW (ref 4.0–10.0)

## 2014-09-17 LAB — IRON AND TIBC CHCC
%SAT: 100 % — ABNORMAL HIGH (ref 20–55)
IRON: 186 ug/dL — AB (ref 42–163)
TIBC: 186 ug/dL — ABNORMAL LOW (ref 202–409)

## 2014-09-17 LAB — RETICULOCYTES (CHCC)
ABS Retic: 17 10*3/uL — ABNORMAL LOW (ref 19.0–186.0)
RBC.: 2.43 MIL/uL — AB (ref 4.22–5.81)
Retic Ct Pct: 0.7 % (ref 0.4–2.3)

## 2014-09-17 LAB — FERRITIN CHCC: Ferritin: 549 ng/ml — ABNORMAL HIGH (ref 22–316)

## 2014-09-17 LAB — CHCC SATELLITE - SMEAR

## 2014-09-17 LAB — VITAMIN B12: Vitamin B-12: 469 pg/mL (ref 211–911)

## 2014-09-17 MED ORDER — DARBEPOETIN ALFA-POLYSORBATE 300 MCG/0.6ML IJ SOLN
INTRAMUSCULAR | Status: AC
  Filled 2014-09-17: qty 0.6

## 2014-09-17 MED ORDER — DARBEPOETIN ALFA-POLYSORBATE 300 MCG/0.6ML IJ SOLN
300.0000 ug | INTRAMUSCULAR | Status: DC
  Administered 2014-09-17: 300 ug via SUBCUTANEOUS

## 2014-09-17 NOTE — Patient Instructions (Signed)
Darbepoetin Alfa injection What is this medicine? DARBEPOETIN ALFA (dar be POE e tin AL fa) helps your body make more red blood cells. It is used to treat anemia caused by chronic kidney failure and chemotherapy. This medicine may be used for other purposes; ask your health care provider or pharmacist if you have questions. COMMON BRAND NAME(S): Aranesp What should I tell my health care provider before I take this medicine? They need to know if you have any of these conditions: -blood clotting disorders or history of blood clots -cancer patient not on chemotherapy -cystic fibrosis -heart disease, such as angina, heart failure, or a history of a heart attack -hemoglobin level of 12 g/dL or greater -high blood pressure -low levels of folate, iron, or vitamin B12 -seizures -an unusual or allergic reaction to darbepoetin, erythropoietin, albumin, hamster proteins, latex, other medicines, foods, dyes, or preservatives -pregnant or trying to get pregnant -breast-feeding How should I use this medicine? This medicine is for injection into a vein or under the skin. It is usually given by a health care professional in a hospital or clinic setting. If you get this medicine at home, you will be taught how to prepare and give this medicine. Do not shake the solution before you withdraw a dose. Use exactly as directed. Take your medicine at regular intervals. Do not take your medicine more often than directed. It is important that you put your used needles and syringes in a special sharps container. Do not put them in a trash can. If you do not have a sharps container, call your pharmacist or healthcare provider to get one. Talk to your pediatrician regarding the use of this medicine in children. While this medicine may be used in children as young as 1 year for selected conditions, precautions do apply. Overdosage: If you think you have taken too much of this medicine contact a poison control center or  emergency room at once. NOTE: This medicine is only for you. Do not share this medicine with others. What if I miss a dose? If you miss a dose, take it as soon as you can. If it is almost time for your next dose, take only that dose. Do not take double or extra doses. What may interact with this medicine? Do not take this medicine with any of the following medications: -epoetin alfa This list may not describe all possible interactions. Give your health care provider a list of all the medicines, herbs, non-prescription drugs, or dietary supplements you use. Also tell them if you smoke, drink alcohol, or use illegal drugs. Some items may interact with your medicine. What should I watch for while using this medicine? Visit your prescriber or health care professional for regular checks on your progress and for the needed blood tests and blood pressure measurements. It is especially important for the doctor to make sure your hemoglobin level is in the desired range, to limit the risk of potential side effects and to give you the best benefit. Keep all appointments for any recommended tests. Check your blood pressure as directed. Ask your doctor what your blood pressure should be and when you should contact him or her. As your body makes more red blood cells, you may need to take iron, folic acid, or vitamin B supplements. Ask your doctor or health care provider which products are right for you. If you have kidney disease continue dietary restrictions, even though this medication can make you feel better. Talk with your doctor or health   care professional about the foods you eat and the vitamins that you take. What side effects may I notice from receiving this medicine? Side effects that you should report to your doctor or health care professional as soon as possible: -allergic reactions like skin rash, itching or hives, swelling of the face, lips, or tongue -breathing problems -changes in vision -chest  pain -confusion, trouble speaking or understanding -feeling faint or lightheaded, falls -high blood pressure -muscle aches or pains -pain, swelling, warmth in the leg -rapid weight gain -severe headaches -sudden numbness or weakness of the face, arm or leg -trouble walking, dizziness, loss of balance or coordination -seizures (convulsions) -swelling of the ankles, feet, hands -unusually weak or tired Side effects that usually do not require medical attention (report to your doctor or health care professional if they continue or are bothersome): -diarrhea -fever, chills (flu-like symptoms) -headaches -nausea, vomiting -redness, stinging, or swelling at site where injected This list may not describe all possible side effects. Call your doctor for medical advice about side effects. You may report side effects to FDA at 1-800-FDA-1088. Where should I keep my medicine? Keep out of the reach of children. Store in a refrigerator between 2 and 8 degrees C (36 and 46 degrees F). Do not freeze. Do not shake. Throw away any unused portion if using a single-dose vial. Throw away any unused medicine after the expiration date. NOTE: This sheet is a summary. It may not cover all possible information. If you have questions about this medicine, talk to your doctor, pharmacist, or health care provider.  2015, [employer]. (2008-10-19 10:23:57)  

## 2014-09-17 NOTE — Progress Notes (Signed)
[place]  Telephone:(336) [phone] Fax:(336) [phone]  ID: Alexander Rice OB: 11/13/1932 MR#: [id number]#:[id number] Patient Care Team: Lisabeth Pick, MD as PCP - General Ladene Artist, MD as Referring Physician (Gastroenterology)  DIAGNOSIS: Anemia of renal insufficiency  Refractory anemia-low-grade  Pernicious anemia  INTERVAL HISTORY: Alexander Rice is back today for a follow-up. He states that he is still feeling good and enjoying his work with the [employer]. His appetite is good an he is staying hydrated. His Hgb today is 8.6 so he will get Aranesp today. He denies fever, chills, cough, rash, headache, SOB, chest pain, palpitations, abdominal pain, constipation, diarrhea, blood in urine or stool. He denies swelling, tenderness, numbness or tingling in his extremities. He's had chronic back issues but these have been doing fairly well. He did have a CT scan done in May. He had a stable, tiny pleural nodules. These were noted a year ago.  CURRENT TREATMENT: Aranesp 300 mcg subcutaneous as needed for him level less than 10  REVIEW OF SYSTEMS: All other 10 point review of systems is negative except for those issues mentioned above.   PAST MEDICAL HISTORY: Past Medical History  Diagnosis Date  . CAD (coronary artery disease)   . GERD (gastroesophageal reflux disease)   . Hyperlipidemia   . Hypertension   . BPH (benign prostatic hyperplasia)   . CVD (cardiovascular disease)   . Adenomatous colon polyp 02/1996, 02/2011    TA polyp 02/2011  . B12 deficiency   . Anemia in chronic renal disease 11/02/2011  . Esophageal stricture   . Diverticulosis   . Hemorrhoids   . MDS (myelodysplastic syndrome)   . Glaucoma   . Heart palpitations   . Hiatal hernia    PAST SURGICAL HISTORY: Past Surgical History  Procedure Laterality Date  . Carotid endarterectomy    . Ptca      stent  . Quadriceps repair      muscle attachment  . Colonoscopy    . Inguinal  hernia repair      right side/ twice  . Tonsillectomy    . Glaucoma surgery Bilateral 11/19/12    pt's report   FAMILY HISTORY Family History  Problem Relation Age of Onset  . COPD Mother   . Alcohol abuse Father    GYNECOLOGIC HISTORY:  No LMP for male patient.   SOCIAL HISTORY:  History   Social History  . Marital Status: Married    Spouse Name: N/A    Number of Children: 3  . Years of Education: N/A   Occupational History  . Retired    Social History Main Topics  . Smoking status: Never Smoker   . Smokeless tobacco: Never Used     Comment: never used tobacco  . Alcohol Use: 3.5 oz/week    7 drink(s) per week  . Drug Use: No  . Sexual Activity: Not on file   Other Topics Concern  . Not on file   Social History Narrative  . No narrative on file   ADVANCED DIRECTIVES: <no information>  HEALTH MAINTENANCE: History  Substance Use Topics  . Smoking status: Never Smoker   . Smokeless tobacco: Never Used     Comment: never used tobacco  . Alcohol Use: 3.5 oz/week    7 drink(s) per week   Colonoscopy: PAP: Bone density: Lipid panel:  No Known Allergies  Current Outpatient Prescriptions  Medication Sig Dispense Refill  . aspirin 81 MG tablet Take 81 mg  by mouth daily.        . Cyanocobalamin (VITAMIN B 12 PO) Inject as directed every 30 (thirty) days.      . dorzolamide (TRUSOPT) 2 % ophthalmic solution Place 1 drop into both eyes 2 (two) times daily.        [name] doxazosin (CARDURA) 8 MG tablet Take 1 tablet (8 mg total) by mouth at bedtime.  90 tablet  3  . isosorbide mononitrate (IMDUR) 30 MG 24 hr tablet Take 1 tablet (30 mg total) by mouth daily.  90 tablet  0  . LUMIGAN 0.01 % SOLN Place 1 drop into both eyes daily.      . Multiple Vitamin (MULTIVITAMIN) tablet Take 1 tablet by mouth daily.        . nitroGLYCERIN (NITROSTAT) 0.4 MG SL tablet Place 0.4 mg under the tongue every 5 (five) minutes as needed.        [name] omeprazole (PRILOSEC) 20 MG capsule Take  1 capsule by mouth  daily  90 capsule  0  . oxybutynin (DITROPAN) 5 MG tablet Take 5 mg by mouth 2 (two) times daily. Patient states he takes once daily.      . potassium chloride SA (K-DUR,KLOR-CON) 20 MEQ tablet Take 1 tablet (20 mEq total) by mouth daily.  90 tablet  3  . simvastatin (ZOCOR) 40 MG tablet Take 1 tablet by mouth  every night at bedtime  90 tablet  1   No current facility-administered medications for this visit.   Facility-Administered Medications Ordered in Other Visits  Medication Dose Route Frequency Provider Last Rate Last Dose  . darbepoetin (ARANESP) injection 300 mcg  300 mcg Subcutaneous Once [name], MD       OBJECTIVE: Filed Vitals:   09/17/14 1002  BP: 165/72  Pulse: 70  Temp: 97.7 F (36.5 C)  Resp: 18   Body mass index is 27.96 kg/(m^2). ECOG FS:0 - Asymptomatic Ocular: Sclerae unicteric, pupils equal, round and reactive to light Ear-nose-throat: Oropharynx clear, dentition fair Lymphatic: No cervical or supraclavicular adenopathy Lungs no rales or rhonchi, good excursion bilaterally Heart regular rate and rhythm, no murmur appreciated Abd soft, nontender, positive bowel sounds MSK no focal spinal tenderness, no joint edema Neuro: non-focal, well-oriented, appropriate affect Breasts: Deferred  LAB RESULTS: CMP     Component Value Date/Time   NA 141 09/17/2014 0931   NA 140 07/20/2014 1020   K 3.8 09/17/2014 0931   K 4.4 07/20/2014 1020   CL 104 09/17/2014 0931   CL 110 07/20/2014 1020   CO2 24 09/17/2014 0931   CO2 24 07/20/2014 1020   GLUCOSE 118 09/17/2014 0931   GLUCOSE 91 07/20/2014 1020   BUN 24* 09/17/2014 0931   BUN 24* 07/20/2014 1020   CREATININE 1.1 09/17/2014 0931   CREATININE 1.18 07/20/2014 1020   CALCIUM 8.4 09/17/2014 0931   CALCIUM 8.6 07/20/2014 1020   PROT 5.9* 09/17/2014 0931   PROT 5.7* 07/20/2014 1020   ALBUMIN 4.0 07/20/2014 1020   AST 19 09/17/2014 0931   AST 18 07/20/2014 1020   ALT 13 09/17/2014 0931   ALT 15 07/20/2014  1020   ALKPHOS 52 09/17/2014 0931   ALKPHOS 43 07/20/2014 1020   BILITOT 0.70 09/17/2014 0931   BILITOT 0.8 07/20/2014 1020   GFRNONAA >60 04/26/2011 0445   GFRAA  Value: >60        The eGFR has been calculated using the MDRD equation. This calculation has not been validated in all  clinical situations. eGFR's persistently <60 mL/min signify possible Chronic Kidney Disease. 04/26/2011 0445   No results found for this basename: SPEP,  UPEP,   kappa and lambda light chains   Lab Results  Component Value Date   WBC 3.9* 09/17/2014   NEUTROABS 2.3 09/17/2014   HGB 8.6* 09/17/2014   HCT 25.4* 09/17/2014   MCV 109* 09/17/2014   PLT 445* 09/17/2014   No results found for this basename: LABCA2   No components found with this basename: TLXBW620   No results found for this basename: INR,  in the last 168 hours  STUDIES: No results found.  ASSESSMENT/PLAN: Mr. Edelen is pleasant 80 year old gentleman with low-grade myelodysplasia. He has refractory anemia. He also has pernicious anemia and anemia of renal insufficiency.  We will give him Aranesp today. We will see him back for labs and follow-up in 1 month. All questions were answered and he is in agreement with the plan. He knows to call here with any questions, we can certainly see him sooner if need be.  Eliezer Bottom, NP 09/17/2014 2:48 PM

## 2014-10-13 ENCOUNTER — Other Ambulatory Visit: Payer: Self-pay | Admitting: Internal Medicine

## 2014-10-18 ENCOUNTER — Ambulatory Visit (INDEPENDENT_AMBULATORY_CARE_PROVIDER_SITE_OTHER): Payer: Medicare Other | Admitting: *Deleted

## 2014-10-18 DIAGNOSIS — E538 Deficiency of other specified B group vitamins: Secondary | ICD-10-CM

## 2014-10-18 MED ORDER — CYANOCOBALAMIN 1000 MCG/ML IJ SOLN
1000.0000 ug | Freq: Once | INTRAMUSCULAR | Status: AC
  Administered 2014-10-18: 1000 ug via INTRAMUSCULAR

## 2014-10-19 ENCOUNTER — Other Ambulatory Visit: Payer: Self-pay | Admitting: *Deleted

## 2014-10-19 ENCOUNTER — Ambulatory Visit (HOSPITAL_BASED_OUTPATIENT_CLINIC_OR_DEPARTMENT_OTHER): Payer: Medicare Other

## 2014-10-19 ENCOUNTER — Encounter: Payer: Self-pay | Admitting: Family

## 2014-10-19 ENCOUNTER — Ambulatory Visit (HOSPITAL_BASED_OUTPATIENT_CLINIC_OR_DEPARTMENT_OTHER): Payer: Medicare Other | Admitting: Family

## 2014-10-19 ENCOUNTER — Other Ambulatory Visit (HOSPITAL_BASED_OUTPATIENT_CLINIC_OR_DEPARTMENT_OTHER): Payer: Medicare Other | Admitting: Lab

## 2014-10-19 DIAGNOSIS — D469 Myelodysplastic syndrome, unspecified: Secondary | ICD-10-CM | POA: Diagnosis not present

## 2014-10-19 DIAGNOSIS — N289 Disorder of kidney and ureter, unspecified: Secondary | ICD-10-CM | POA: Diagnosis not present

## 2014-10-19 DIAGNOSIS — D649 Anemia, unspecified: Secondary | ICD-10-CM

## 2014-10-19 DIAGNOSIS — N189 Chronic kidney disease, unspecified: Principal | ICD-10-CM

## 2014-10-19 DIAGNOSIS — D464 Refractory anemia, unspecified: Secondary | ICD-10-CM

## 2014-10-19 DIAGNOSIS — D510 Vitamin B12 deficiency anemia due to intrinsic factor deficiency: Secondary | ICD-10-CM | POA: Diagnosis not present

## 2014-10-19 DIAGNOSIS — D631 Anemia in chronic kidney disease: Secondary | ICD-10-CM

## 2014-10-19 LAB — COMPREHENSIVE METABOLIC PANEL
ALBUMIN: 4 g/dL (ref 3.5–5.2)
ALK PHOS: 49 U/L (ref 39–117)
ALT: 12 U/L (ref 0–53)
AST: 16 U/L (ref 0–37)
BUN: 18 mg/dL (ref 6–23)
CHLORIDE: 109 meq/L (ref 96–112)
CO2: 26 mEq/L (ref 19–32)
Calcium: 8.5 mg/dL (ref 8.4–10.5)
Creatinine, Ser: 1.18 mg/dL (ref 0.50–1.35)
Glucose, Bld: 153 mg/dL — ABNORMAL HIGH (ref 70–99)
Potassium: 4.3 mEq/L (ref 3.5–5.3)
SODIUM: 141 meq/L (ref 135–145)
TOTAL PROTEIN: 5.6 g/dL — AB (ref 6.0–8.3)
Total Bilirubin: 0.7 mg/dL (ref 0.2–1.2)

## 2014-10-19 LAB — CBC WITH DIFFERENTIAL (CANCER CENTER ONLY)
BASO#: 0 10*3/uL (ref 0.0–0.2)
BASO%: 0.3 % (ref 0.0–2.0)
EOS%: 1.6 % (ref 0.0–7.0)
Eosinophils Absolute: 0.1 10*3/uL (ref 0.0–0.5)
HCT: 26.8 % — ABNORMAL LOW (ref 38.7–49.9)
HGB: 8.9 g/dL — ABNORMAL LOW (ref 13.0–17.1)
LYMPH#: 1.1 10*3/uL (ref 0.9–3.3)
LYMPH%: 29.9 % (ref 14.0–48.0)
MCH: 36.3 pg — AB (ref 28.0–33.4)
MCHC: 33.2 g/dL (ref 32.0–35.9)
MCV: 109 fL — ABNORMAL HIGH (ref 82–98)
MONO#: 0.3 10*3/uL (ref 0.1–0.9)
MONO%: 8 % (ref 0.0–13.0)
NEUT%: 60.2 % (ref 40.0–80.0)
NEUTROS ABS: 2.3 10*3/uL (ref 1.5–6.5)
Platelets: 397 10*3/uL (ref 145–400)
RBC: 2.45 10*6/uL — ABNORMAL LOW (ref 4.20–5.70)
RDW: 16.3 % — AB (ref 11.1–15.7)
WBC: 3.7 10*3/uL — ABNORMAL LOW (ref 4.0–10.0)

## 2014-10-19 LAB — IRON AND TIBC CHCC
%SAT: 76 % — AB (ref 20–55)
Iron: 156 ug/dL (ref 42–163)
TIBC: 206 ug/dL (ref 202–409)
UIBC: 50 ug/dL — ABNORMAL LOW (ref 117–376)

## 2014-10-19 LAB — RETICULOCYTES (CHCC)
ABS RETIC: 17.5 10*3/uL — AB (ref 19.0–186.0)
RBC.: 2.5 MIL/uL — ABNORMAL LOW (ref 4.22–5.81)
Retic Ct Pct: 0.7 % (ref 0.4–2.3)

## 2014-10-19 LAB — FERRITIN CHCC: Ferritin: 550 ng/ml — ABNORMAL HIGH (ref 22–316)

## 2014-10-19 MED ORDER — DARBEPOETIN ALFA 300 MCG/0.6ML IJ SOSY
PREFILLED_SYRINGE | INTRAMUSCULAR | Status: AC
  Filled 2014-10-19: qty 0.6

## 2014-10-19 MED ORDER — DARBEPOETIN ALFA 300 MCG/0.6ML IJ SOSY
300.0000 ug | PREFILLED_SYRINGE | Freq: Once | INTRAMUSCULAR | Status: AC
  Administered 2014-10-19: 300 ug via SUBCUTANEOUS

## 2014-10-19 NOTE — Progress Notes (Signed)
[place]  Telephone:(336) [phone] Fax:(336) [phone]  ID: GEROLD SAR Rice: Jul 12, 1975 MR#: [id number]#:[id number] Patient Care Team: Lisabeth Pick, MD as PCP - General Ladene Artist, MD as Referring Physician (Gastroenterology)  DIAGNOSIS: Anemia of renal insufficiency  Refractory anemia-low-grade  Pernicious anemia  INTERVAL HISTORY: [name] is back today for a follow-up (80-year-old). He is doing well and enjoying his work with the [employer].  His appetite is good and he is staying hydrated. His weight is stable. His Hgb today is 8.9. He will get Aranesp today.  He denies fever, chills, cough, rash, headache, SOB, chest pain, palpitations, abdominal pain, constipation, diarrhea, blood in urine or stool.  He denies swelling, tenderness, numbness or tingling in his extremities.  He's had chronic back issues but these have been doing fairly well.  He did have a CT scan done in May. He had a stable, tiny pleural nodules. These were noted a year ago.  CURRENT TREATMENT: Aranesp 300 mcg subcutaneous as needed for him level less than 10  REVIEW OF SYSTEMS: All other 10 point review of systems is negative except for those issues mentioned above.   PAST MEDICAL HISTORY: Past Medical History  Diagnosis Date  . CAD (coronary artery disease)   . GERD (gastroesophageal reflux disease)   . Hyperlipidemia   . Hypertension   . BPH (benign prostatic hyperplasia)   . CVD (cardiovascular disease)   . Adenomatous colon polyp 02/1996, 02/2011    TA polyp 02/2011  . B12 deficiency   . Anemia in chronic renal disease 11/02/2011  . Esophageal stricture   . Diverticulosis   . Hemorrhoids   . MDS (myelodysplastic syndrome)   . Glaucoma   . Heart palpitations   . Hiatal hernia    PAST SURGICAL HISTORY: Past Surgical History  Procedure Laterality Date  . Carotid endarterectomy    . Ptca      stent  . Quadriceps repair      muscle attachment  . Colonoscopy    .  Inguinal hernia repair      right side/ twice  . Tonsillectomy    . Glaucoma surgery Bilateral 11/19/12    pt's report   FAMILY HISTORY Family History  Problem Relation Age of Onset  . COPD Mother   . Alcohol abuse Father    GYNECOLOGIC HISTORY:  No LMP for male patient.   SOCIAL HISTORY:  History   Social History  . Marital Status: Married    Spouse Name: N/A    Number of Children: 3  . Years of Education: N/A   Occupational History  . Retired    Social History Main Topics  . Smoking status: Never Smoker   . Smokeless tobacco: Never Used     Comment: never used tobacco  . Alcohol Use: 3.5 oz/week    7 drink(s) per week  . Drug Use: No  . Sexual Activity: Not on file   Other Topics Concern  . Not on file   Social History Narrative   ADVANCED DIRECTIVES: <no information>  HEALTH MAINTENANCE: History  Substance Use Topics  . Smoking status: Never Smoker   . Smokeless tobacco: Never Used     Comment: never used tobacco  . Alcohol Use: 3.5 oz/week    7 drink(s) per week   Colonoscopy: PAP: Bone density: Lipid panel:  No Known Allergies  Current Outpatient Prescriptions  Medication Sig Dispense Refill  . aspirin 81 MG tablet Take 81 mg by mouth  daily.      . Cyanocobalamin (VITAMIN B 12 PO) Inject as directed every 30 (thirty) days.    . dorzolamide (TRUSOPT) 2 % ophthalmic solution Place 1 drop into both eyes 2 (two) times daily.      [name] doxazosin (CARDURA) 8 MG tablet Take 1 tablet (8 mg total)  by mouth at bedtime. 90 tablet 0  . isosorbide mononitrate (IMDUR) 30 MG 24 hr tablet Take 1 tablet (30 mg total) by mouth daily. 90 tablet 0  . LUMIGAN 0.01 % SOLN Place 1 drop into both eyes daily.    . Multiple Vitamin (MULTIVITAMIN) tablet Take 1 tablet by mouth daily.      . nitroGLYCERIN (NITROSTAT) 0.4 MG SL tablet Place 0.4 mg under the tongue every 5 (five) minutes as needed.      [name] omeprazole (PRILOSEC) 20 MG capsule Take 1 capsule by mouth  daily 90  capsule 0  . oxybutynin (DITROPAN) 5 MG tablet Take 5 mg by mouth 2 (two) times daily. Patient states he takes once daily.    . potassium chloride SA (K-DUR,KLOR-CON) 20 MEQ tablet Take 1 tablet (20 mEq total) by mouth daily. 90 tablet 3  . simvastatin (ZOCOR) 40 MG tablet Take 1 tablet by mouth  every night at bedtime 90 tablet 1   No current facility-administered medications for this visit.   Facility-Administered Medications Ordered in Other Visits  Medication Dose Route Frequency Provider Last Rate Last Dose  . darbepoetin (ARANESP) injection 300 mcg  300 mcg Subcutaneous Once [name], MD       OBJECTIVE: Filed Vitals:   10/19/14 1047  BP: 157/54  Pulse: 76  Temp: 97.7 F (36.5 C)  Resp: 18   Body mass index is 27.62 kg/(m^2). ECOG FS:0 - Asymptomatic Ocular: Sclerae unicteric, pupils equal, round and reactive to light Ear-nose-throat: Oropharynx clear, dentition fair Lymphatic: No cervical or supraclavicular adenopathy Lungs no rales or rhonchi, good excursion bilaterally Heart regular rate and rhythm, no murmur appreciated Abd soft, nontender, positive bowel sounds MSK no focal spinal tenderness, no joint edema Neuro: non-focal, well-oriented, appropriate affect Breasts: Deferred  LAB RESULTS: CMP     Component Value Date/Time   NA 141 09/17/2014 0931   NA 140 07/20/2014 1020   K 3.8 09/17/2014 0931   K 4.4 07/20/2014 1020   CL 104 09/17/2014 0931   CL 110 07/20/2014 1020   CO2 24 09/17/2014 0931   CO2 24 07/20/2014 1020   GLUCOSE 118 09/17/2014 0931   GLUCOSE 91 07/20/2014 1020   BUN 24* 09/17/2014 0931   BUN 24* 07/20/2014 1020   CREATININE 1.1 09/17/2014 0931   CREATININE 1.18 07/20/2014 1020   CALCIUM 8.4 09/17/2014 0931   CALCIUM 8.6 07/20/2014 1020   PROT 5.9* 09/17/2014 0931   PROT 5.7* 07/20/2014 1020   ALBUMIN 4.0 07/20/2014 1020   AST 19 09/17/2014 0931   AST 18 07/20/2014 1020   ALT 13 09/17/2014 0931   ALT 15 07/20/2014 1020   ALKPHOS  52 09/17/2014 0931   ALKPHOS 43 07/20/2014 1020   BILITOT 0.70 09/17/2014 0931   BILITOT 0.8 07/20/2014 1020   GFRNONAA >60 04/26/2011 0445   GFRAA  04/26/2011 0445    >60        The eGFR has been calculated using the MDRD equation. This calculation has not been validated in all clinical situations. eGFR's persistently <60 mL/min signify possible Chronic Kidney Disease.   No results found for: SPEP Lab Results  Component Value Date   WBC 3.7* 10/19/2014   NEUTROABS 2.3 10/19/2014   HGB 8.9* 10/19/2014   HCT 26.8* 10/19/2014   MCV 109* 10/19/2014   PLT 397 10/19/2014   No results found for: LABCA2 No components found for: LABCA125 No results for input(s): INR in the last 168 hours.  STUDIES: No results found.  ASSESSMENT/PLAN: Mr. Milius is pleasant 81-year-old gentleman with low-grade myelodysplasia. He has refractory anemia. He also has pernicious anemia and anemia of renal insufficiency.  We will give him Aranesp today. We will see him back for labs and follow-up in 6 weeks. All questions were answered and he is in agreement with the plan. He knows to call here with any questions, we can certainly see him sooner if need be.  [place],SARAH M, NP 10/19/2014 11:17 AM 

## 2014-10-19 NOTE — Patient Instructions (Signed)
Darbepoetin Alfa injection What is this medicine? DARBEPOETIN ALFA (dar be POE e tin AL fa) helps your body make more red blood cells. It is used to treat anemia caused by chronic kidney failure and chemotherapy. This medicine may be used for other purposes; ask your health care provider or pharmacist if you have questions. COMMON BRAND NAME(S): Aranesp What should I tell my health care provider before I take this medicine? They need to know if you have any of these conditions: -blood clotting disorders or history of blood clots -cancer patient not on chemotherapy -cystic fibrosis -heart disease, such as angina, heart failure, or a history of a heart attack -hemoglobin level of 12 g/dL or greater -high blood pressure -low levels of folate, iron, or vitamin B12 -seizures -an unusual or allergic reaction to darbepoetin, erythropoietin, albumin, hamster proteins, latex, other medicines, foods, dyes, or preservatives -pregnant or trying to get pregnant -breast-feeding How should I use this medicine? This medicine is for injection into a vein or under the skin. It is usually given by a health care professional in a hospital or clinic setting. If you get this medicine at home, you will be taught how to prepare and give this medicine. Do not shake the solution before you withdraw a dose. Use exactly as directed. Take your medicine at regular intervals. Do not take your medicine more often than directed. It is important that you put your used needles and syringes in a special sharps container. Do not put them in a trash can. If you do not have a sharps container, call your pharmacist or healthcare provider to get one. Talk to your pediatrician regarding the use of this medicine in children. While this medicine may be used in children as young as 1 year for selected conditions, precautions do apply. Overdosage: If you think you have taken too much of this medicine contact a poison control center or  emergency room at once. NOTE: This medicine is only for you. Do not share this medicine with others. What if I miss a dose? If you miss a dose, take it as soon as you can. If it is almost time for your next dose, take only that dose. Do not take double or extra doses. What may interact with this medicine? Do not take this medicine with any of the following medications: -epoetin alfa This list may not describe all possible interactions. Give your health care provider a list of all the medicines, herbs, non-prescription drugs, or dietary supplements you use. Also tell them if you smoke, drink alcohol, or use illegal drugs. Some items may interact with your medicine. What should I watch for while using this medicine? Visit your prescriber or health care professional for regular checks on your progress and for the needed blood tests and blood pressure measurements. It is especially important for the doctor to make sure your hemoglobin level is in the desired range, to limit the risk of potential side effects and to give you the best benefit. Keep all appointments for any recommended tests. Check your blood pressure as directed. Ask your doctor what your blood pressure should be and when you should contact him or her. As your body makes more red blood cells, you may need to take iron, folic acid, or vitamin B supplements. Ask your doctor or health care provider which products are right for you. If you have kidney disease continue dietary restrictions, even though this medication can make you feel better. Talk with your doctor or health   care professional about the foods you eat and the vitamins that you take. What side effects may I notice from receiving this medicine? Side effects that you should report to your doctor or health care professional as soon as possible: -allergic reactions like skin rash, itching or hives, swelling of the face, lips, or tongue -breathing problems -changes in vision -chest  pain -confusion, trouble speaking or understanding -feeling faint or lightheaded, falls -high blood pressure -muscle aches or pains -pain, swelling, warmth in the leg -rapid weight gain -severe headaches -sudden numbness or weakness of the face, arm or leg -trouble walking, dizziness, loss of balance or coordination -seizures (convulsions) -swelling of the ankles, feet, hands -unusually weak or tired Side effects that usually do not require medical attention (report to your doctor or health care professional if they continue or are bothersome): -diarrhea -fever, chills (flu-like symptoms) -headaches -nausea, vomiting -redness, stinging, or swelling at site where injected This list may not describe all possible side effects. Call your doctor for medical advice about side effects. You may report side effects to FDA at 1-800-FDA-1088. Where should I keep my medicine? Keep out of the reach of children. Store in a refrigerator between 2 and 8 degrees C (36 and 46 degrees F). Do not freeze. Do not shake. Throw away any unused portion if using a single-dose vial. Throw away any unused medicine after the expiration date. NOTE: This sheet is a summary. It may not cover all possible information. If you have questions about this medicine, talk to your doctor, pharmacist, or health care provider.  2015, [employer]. (2008-10-19 10:23:57)  

## 2014-11-01 ENCOUNTER — Telehealth: Payer: Self-pay | Admitting: Internal Medicine

## 2014-11-01 NOTE — Telephone Encounter (Signed)
LM on pt VM TCB 

## 2014-11-01 NOTE — Telephone Encounter (Signed)
Suandrea-I need your help on this. I asked Jenny Reichmann to help me look and we cannot find HIPPA release for daughter. I am not sure that I can communicate back to daughter or use information unless she comes with him to visit or he signs HIPPA for her. There is no HCPOA.

## 2014-11-01 NOTE — Telephone Encounter (Signed)
Pt daughter would like keba to return her call concerning ?dementia

## 2014-11-01 NOTE — Telephone Encounter (Signed)
(  FYI) Pt daughter states that she had mentioned to Dr. Leanne Chang that she had noticed changes in her dad not being able to remember and getting words out and losing things but really hasn't and she states there is a family Hx of Dementia and Alzheimers but pt does not think anything is wrong with him and pt refuses to see anyone about this. So pt would like to know if he can have a referral or visit for cognitive changes. Pt daughter does not want this brought up that she/ the family has mentioned this and would like you to bring it up at his visit but would like you to bring it up in hopes that her dad will listen to you and take your advice. Pt will be seeing you in January.

## 2014-11-02 NOTE — Telephone Encounter (Signed)
There is no DPR or HCPOA on file for [place] to speak with [name] daughter.  Pt would need to sign DPR in order for [place] to discuss any of his  medical information with his daughter.  Bevelyn Ngo will you please let pt's daughter know this?  I have alerted front desk employees to ask [name] to sign a DPR at his next appt in January or he can come in and sign one sooner if her prefers.

## 2014-11-03 NOTE — Telephone Encounter (Signed)
Notified pt daughter and she will have her dad sign a DPR when he comes in January.

## 2014-11-15 ENCOUNTER — Other Ambulatory Visit: Payer: Self-pay | Admitting: *Deleted

## 2014-11-15 MED ORDER — POTASSIUM CHLORIDE CRYS ER 20 MEQ PO TBCR
20.0000 meq | EXTENDED_RELEASE_TABLET | Freq: Every day | ORAL | Status: DC
Start: 2014-11-15 — End: 2015-02-18

## 2014-11-22 DIAGNOSIS — M79674 Pain in right toe(s): Secondary | ICD-10-CM | POA: Diagnosis not present

## 2014-11-22 DIAGNOSIS — B351 Tinea unguium: Secondary | ICD-10-CM | POA: Diagnosis not present

## 2014-11-25 DIAGNOSIS — H409 Unspecified glaucoma: Secondary | ICD-10-CM | POA: Diagnosis not present

## 2014-11-25 DIAGNOSIS — H4011X0 Primary open-angle glaucoma, stage unspecified: Secondary | ICD-10-CM | POA: Diagnosis not present

## 2014-11-30 ENCOUNTER — Encounter: Payer: Self-pay | Admitting: *Deleted

## 2014-11-30 ENCOUNTER — Ambulatory Visit: Payer: Medicare Other

## 2014-11-30 ENCOUNTER — Other Ambulatory Visit (HOSPITAL_BASED_OUTPATIENT_CLINIC_OR_DEPARTMENT_OTHER): Payer: Medicare Other | Admitting: Lab

## 2014-11-30 ENCOUNTER — Ambulatory Visit (HOSPITAL_BASED_OUTPATIENT_CLINIC_OR_DEPARTMENT_OTHER): Payer: Medicare Other | Admitting: Hematology & Oncology

## 2014-11-30 ENCOUNTER — Encounter: Payer: Self-pay | Admitting: Hematology & Oncology

## 2014-11-30 VITALS — BP 167/58 | HR 90 | Temp 97.4°F | Resp 18 | Ht 65.0 in | Wt 167.0 lb

## 2014-11-30 DIAGNOSIS — D464 Refractory anemia, unspecified: Secondary | ICD-10-CM | POA: Diagnosis not present

## 2014-11-30 DIAGNOSIS — N189 Chronic kidney disease, unspecified: Principal | ICD-10-CM

## 2014-11-30 DIAGNOSIS — D631 Anemia in chronic kidney disease: Secondary | ICD-10-CM | POA: Diagnosis not present

## 2014-11-30 DIAGNOSIS — D518 Other vitamin B12 deficiency anemias: Secondary | ICD-10-CM | POA: Diagnosis not present

## 2014-11-30 DIAGNOSIS — N289 Disorder of kidney and ureter, unspecified: Secondary | ICD-10-CM

## 2014-11-30 DIAGNOSIS — D649 Anemia, unspecified: Secondary | ICD-10-CM

## 2014-11-30 DIAGNOSIS — D510 Vitamin B12 deficiency anemia due to intrinsic factor deficiency: Secondary | ICD-10-CM | POA: Diagnosis not present

## 2014-11-30 DIAGNOSIS — D469 Myelodysplastic syndrome, unspecified: Secondary | ICD-10-CM

## 2014-11-30 LAB — CBC WITH DIFFERENTIAL (CANCER CENTER ONLY)
BASO#: 0 10*3/uL (ref 0.0–0.2)
BASO%: 0.3 % (ref 0.0–2.0)
EOS%: 2.1 % (ref 0.0–7.0)
Eosinophils Absolute: 0.1 10*3/uL (ref 0.0–0.5)
HEMATOCRIT: 26.6 % — AB (ref 38.7–49.9)
HEMOGLOBIN: 8.7 g/dL — AB (ref 13.0–17.1)
LYMPH#: 1.2 10*3/uL (ref 0.9–3.3)
LYMPH%: 32.9 % (ref 14.0–48.0)
MCH: 35.2 pg — ABNORMAL HIGH (ref 28.0–33.4)
MCHC: 32.7 g/dL (ref 32.0–35.9)
MCV: 108 fL — AB (ref 82–98)
MONO#: 0.2 10*3/uL (ref 0.1–0.9)
MONO%: 6.4 % (ref 0.0–13.0)
NEUT#: 2.2 10*3/uL (ref 1.5–6.5)
NEUT%: 58.3 % (ref 40.0–80.0)
Platelets: 388 10*3/uL (ref 145–400)
RBC: 2.47 10*6/uL — ABNORMAL LOW (ref 4.20–5.70)
RDW: 15.6 % (ref 11.1–15.7)
WBC: 3.7 10*3/uL — ABNORMAL LOW (ref 4.0–10.0)

## 2014-11-30 LAB — COMPREHENSIVE METABOLIC PANEL
ALBUMIN: 4 g/dL (ref 3.5–5.2)
ALK PHOS: 49 U/L (ref 39–117)
ALT: 14 U/L (ref 0–53)
AST: 18 U/L (ref 0–37)
BUN: 24 mg/dL — AB (ref 6–23)
CHLORIDE: 108 meq/L (ref 96–112)
CO2: 24 mEq/L (ref 19–32)
Calcium: 8.8 mg/dL (ref 8.4–10.5)
Creatinine, Ser: 1.06 mg/dL (ref 0.50–1.35)
Glucose, Bld: 112 mg/dL — ABNORMAL HIGH (ref 70–99)
Potassium: 4.1 mEq/L (ref 3.5–5.3)
SODIUM: 140 meq/L (ref 135–145)
Total Bilirubin: 0.9 mg/dL (ref 0.2–1.2)
Total Protein: 5.9 g/dL — ABNORMAL LOW (ref 6.0–8.3)

## 2014-11-30 LAB — IRON AND TIBC CHCC
%SAT: >100 % (ref 20–?)
Iron: 198 ug/dL — ABNORMAL HIGH (ref 42–163)
TIBC: 196 ug/dL — AB (ref 202–409)

## 2014-11-30 LAB — RETICULOCYTES (CHCC)
ABS RETIC: 22.7 10*3/uL (ref 19.0–186.0)
RBC.: 2.52 MIL/uL — AB (ref 4.22–5.81)
RETIC CT PCT: 0.9 % (ref 0.4–2.3)

## 2014-11-30 LAB — FERRITIN CHCC: Ferritin: 592 ng/ml — ABNORMAL HIGH (ref 22–316)

## 2014-11-30 MED ORDER — DARBEPOETIN ALFA 300 MCG/0.6ML IJ SOSY
PREFILLED_SYRINGE | INTRAMUSCULAR | Status: AC
  Filled 2014-11-30: qty 0.6

## 2014-11-30 NOTE — Progress Notes (Signed)
Aranesp held today per Dr Antonieta Pert order. MD is aware that pt's HGB 8.7.

## 2014-11-30 NOTE — Progress Notes (Signed)
No injection today per dr. ennever 

## 2014-11-30 NOTE — Progress Notes (Signed)
Hematology and Oncology Follow Up Visit  Alexander Rice [id number] 1932-01-07 81 y.o. 11/30/2014   Principle Diagnosis:   Anemia of renal insufficiency  Refractory anemia-low-grade  Pernicious anemia  Current Therapy:   Aranesp 300 mcg subcutaneous as needed for hemoglobin less than 10     Interim History:  Mr.  Alexander Rice is back for followup.he enjoyed the Christmas holiday. He did not go anywhere. He feels good right now.  His last Aranesp was given back in early December.  He's had no bleeding.  He's had no change in bowel or bladder habits. He does see urology.  He's had no problems with bleeding. He's had no rashes. He's had a few episodes of leg swelling.  His last iron studies done in early December showed ferritin of 550. His iron saturation is 76%. Total iron is 156. Rate and rhythm  Medications: Current outpatient prescriptions: aspirin 81 MG tablet, Take 81 mg by mouth daily.  , Disp: , Rfl: ;  Cyanocobalamin (VITAMIN B 12 PO), Inject as directed every 30 (thirty) days., Disp: , Rfl: ;  dorzolamide (TRUSOPT) 2 % ophthalmic solution, Place 1 drop into both eyes 2 (two) times daily.  , Disp: , Rfl: ;  doxazosin (CARDURA) 8 MG tablet, Take 1 tablet (8 mg total)  by mouth at bedtime., Disp: 90 tablet, Rfl: 0 isosorbide mononitrate (IMDUR) 30 MG 24 hr tablet, Take 1 tablet (30 mg total) by mouth daily., Disp: 90 tablet, Rfl: 0;  LUMIGAN 0.01 % SOLN, Place 1 drop into both eyes daily., Disp: , Rfl: ;  Multiple Vitamin (MULTIVITAMIN) tablet, Take 1 tablet by mouth daily.  , Disp: , Rfl: ;  nitroGLYCERIN (NITROSTAT) 0.4 MG SL tablet, Place 0.4 mg under the tongue every 5 (five) minutes as needed.  , Disp: , Rfl:  omeprazole (PRILOSEC) 20 MG capsule, Take 1 capsule by mouth  daily, Disp: 90 capsule, Rfl: 0;  oxybutynin (DITROPAN) 5 MG tablet, Take 5 mg by mouth 2 (two) times daily. Patient states he takes once daily., Disp: , Rfl: ;  potassium chloride SA (K-DUR,KLOR-CON) 20 MEQ  tablet, Take 1 tablet (20 mEq total) by mouth daily., Disp: 90 tablet, Rfl: 0 simvastatin (ZOCOR) 40 MG tablet, Take 1 tablet by mouth  every night at bedtime, Disp: 90 tablet, Rfl: 1 No current facility-administered medications for this visit. Facility-Administered Medications Ordered in Other Visits: darbepoetin (ARANESP) injection 300 mcg, 300 mcg, Subcutaneous, Once, [name], MD  Allergies: No Known Allergies  Past Medical History, Surgical history, Social history, and Family History were reviewed and updated.  Review of Systems: As above  Physical Exam:  height is 5\' 5"  (1.651 m) and weight is 167 lb (75.751 kg). His oral temperature is 97.4 F (36.3 C). His blood pressure is 167/58 and his pulse is 90. His respiration is 18.   Thin, elderly white gentleman in no obvious distress. Head and neck exam shows no ocular or oral lesions. He has no adenopathy in the neck.. There's kyphosis of the spine. His lungs are clear bilaterally.. Cardiac exam regular rate and rhythm. He has a 1/ 6 systolic murmur. Abdomen is soft. He has good bowel sounds. There is no palpable liver or spleen tip. Extremities shows age-related changes in his joints. He has good strength. His mild edema in his ankles.. Skin exam no rashes, ecchymoses or petechia.. Neurological exam is nonfocal.  Lab Results  Component Value Date   WBC 3.7* 11/30/2014   HGB 8.7* 11/30/2014  HCT 26.6* 11/30/2014   MCV 108* 11/30/2014   PLT 388 11/30/2014     Chemistry      Component Value Date/Time   NA 141 10/19/2014 1030   NA 141 09/17/2014 0931   K 4.3 10/19/2014 1030   K 3.8 09/17/2014 0931   CL 109 10/19/2014 1030   CL 104 09/17/2014 0931   CO2 26 10/19/2014 1030   CO2 24 09/17/2014 0931   BUN 18 10/19/2014 1030   BUN 24* 09/17/2014 0931   CREATININE 1.18 10/19/2014 1030   CREATININE 1.1 09/17/2014 0931      Component Value Date/Time   CALCIUM 8.5 10/19/2014 1030   CALCIUM 8.4 09/17/2014 0931   ALKPHOS 49  10/19/2014 1030   ALKPHOS 52 09/17/2014 0931   AST 16 10/19/2014 1030   AST 19 09/17/2014 0931   ALT 12 10/19/2014 1030   ALT 13 09/17/2014 0931   BILITOT 0.7 10/19/2014 1030   BILITOT 0.70 09/17/2014 0931        Impression and Plan: [name] is 81 year old gentleman with low-grade myelodysplasia. He has refractory anemia. He also has pernicious anemia and anemia of renal insufficiency.  He feels very well, however, so we will go ahead and hold his dose of Aranesp today.  I will have him come back in one month. I'm sure that he will need his Aranesp at that point time.    [name], MD 1/12/201610:16 AM

## 2014-12-01 ENCOUNTER — Ambulatory Visit (INDEPENDENT_AMBULATORY_CARE_PROVIDER_SITE_OTHER): Payer: Medicare Other | Admitting: Family Medicine

## 2014-12-01 ENCOUNTER — Encounter: Payer: Self-pay | Admitting: Family Medicine

## 2014-12-01 VITALS — BP 160/62 | Temp 97.6°F | Wt 165.0 lb

## 2014-12-01 DIAGNOSIS — E538 Deficiency of other specified B group vitamins: Secondary | ICD-10-CM | POA: Diagnosis not present

## 2014-12-01 DIAGNOSIS — H409 Unspecified glaucoma: Secondary | ICD-10-CM | POA: Insufficient documentation

## 2014-12-01 DIAGNOSIS — I2510 Atherosclerotic heart disease of native coronary artery without angina pectoris: Secondary | ICD-10-CM | POA: Diagnosis not present

## 2014-12-01 DIAGNOSIS — E785 Hyperlipidemia, unspecified: Secondary | ICD-10-CM

## 2014-12-01 DIAGNOSIS — I10 Essential (primary) hypertension: Secondary | ICD-10-CM | POA: Diagnosis not present

## 2014-12-01 MED ORDER — CYANOCOBALAMIN 1000 MCG/ML IJ SOLN
1000.0000 ug | Freq: Once | INTRAMUSCULAR | Status: AC
  Administered 2014-12-01: 1000 ug via INTRAMUSCULAR

## 2014-12-01 MED ORDER — LISINOPRIL-HYDROCHLOROTHIAZIDE 10-12.5 MG PO TABS: 1.0000 | ORAL_TABLET | Freq: Every day | ORAL | Status: DC

## 2014-12-01 NOTE — Patient Instructions (Addendum)
i want you to restart lisinopril-hctz combination pill at low dose. See me back within 2 weeks of starting new medicine-sent in to [facility].   You do not need a colonoscopy past age 81 per Dr. Fuller Plan.   Glad no chest pain.   Health Maintenance Due  Topic Date Due  . ZOSTAVAX - shingles vaccine. Let us know if you want this-call your insurance to see where they want you to get it 09/21/1994

## 2014-12-01 NOTE — Assessment & Plan Note (Addendum)
Previously controlled. Continue Simvastatin 40mg . Consider direct ldl next visit. Other labs updated by hem/onc

## 2014-12-01 NOTE — Assessment & Plan Note (Signed)
Poor control on last 3 visits with systolic above 258 and with CAD want improved control. Start back on lisinopril-hctz 10-12.5mg  at lower dose than previous. Follow up 2 weeks after start with warning signs for sooner follow up given.

## 2014-12-01 NOTE — Progress Notes (Signed)
Alexander Reddish, MD Phone: [phone]  Subjective:  Patient presents today to establish care with me as their new primary care provider. Patient was formerly a patient of Dr. Leanne Chang. Chief complaint-noted.   CAD with history stents 1994-asymptomatic COmpliant with aspirin, statin, imdur. On this regimen, Has not taken nitroglycerin in last year. No chest pain.  ROS-no shortness of breath, doe, worsening fatigue  Hypertension-poor control  BP Readings from Last 3 Encounters:  12/01/14 160/62  11/30/14 167/58  10/19/14 157/54  Home BP monitoring-no Compliant with medications-yes without side effects, cardura 8mg  and imdur ROS-Denies any CP, HA, SOB, blurry vision, LE edema.   Hyperlipidemia-controlled previously  Lab Results  Component Value Date   LDLCALC 26 07/21/2013  On statin: simvastatin 40mg  ROS- no chest pain or shortness of breath. No myalgias  The following were reviewed and entered/updated in epic: Past Medical History  Diagnosis Date  . CAD (coronary artery disease)   . GERD (gastroesophageal reflux disease)   . Hyperlipidemia   . Hypertension   . BPH (benign prostatic hyperplasia)   . CVD (cardiovascular disease)   . Adenomatous colon polyp 02/1996, 02/2011    TA polyp 02/2011  . B12 deficiency   . Anemia in chronic renal disease 11/02/2011  . Esophageal stricture   . Diverticulosis   . Hemorrhoids   . MDS (myelodysplastic syndrome)   . Glaucoma   . Heart palpitations   . Hiatal hernia   . History of colonic polyps 09/29/2006    Polyps age 89, Dr. Fuller Plan stated no further colonoscopy due to age     Patient Active Problem List   Diagnosis Date Noted  . CAD (coronary artery disease) 09/29/2006    Priority: High  . MDS (myelodysplastic syndrome) 05/14/2011    Priority: Medium  . Hyperlipidemia 09/29/2006    Priority: Medium  . Essential hypertension 09/29/2006    Priority: Medium  . BPH (benign prostatic hyperplasia) 09/29/2006    Priority: Medium    . Glaucoma 12/01/2014    Priority: Low  . Pulmonary nodule 04/08/2012    Priority: Low  . ANEMIA, B12 DEFICIENCY 07/03/2007    Priority: Low  . GERD 09/29/2006    Priority: Low   Past Surgical History  Procedure Laterality Date  . Carotid endarterectomy    . Ptca      stent  . Quadriceps repair      muscle attachment  . Colonoscopy    . Inguinal hernia repair      right side/ twice  . Tonsillectomy    . Glaucoma surgery Bilateral 11/19/12    pt's report    Family History  Problem Relation Age of Onset  . COPD Mother   . Alcohol abuse Father     Medications- reviewed and updated Current Outpatient Prescriptions  Medication Sig Dispense Refill  . aspirin 81 MG tablet Take 81 mg by mouth daily.      . Cyanocobalamin (VITAMIN B 12 PO) Inject as directed every 30 (thirty) days.    [name] doxazosin (CARDURA) 8 MG tablet Take 1 tablet (8 mg total)  by mouth at bedtime. 90 tablet 0  . isosorbide mononitrate (IMDUR) 30 MG 24 hr tablet Take 1 tablet (30 mg total) by mouth daily. 90 tablet 0  . LUMIGAN 0.01 % SOLN Place 1 drop into both eyes daily.    . Multiple Vitamin (MULTIVITAMIN) tablet Take 1 tablet by mouth daily.      [name] omeprazole (PRILOSEC) 20 MG capsule Take 1 capsule by  mouth  daily 90 capsule 0  . oxybutynin (DITROPAN) 5 MG tablet Take 5 mg by mouth 2 (two) times daily. Patient states he takes once daily.    . potassium chloride SA (K-DUR,KLOR-CON) 20 MEQ tablet Take 1 tablet (20 mEq total) by mouth daily. 90 tablet 0  . simvastatin (ZOCOR) 40 MG tablet Take 1 tablet by mouth  every night at bedtime 90 tablet 1  . dorzolamide (TRUSOPT) 2 % ophthalmic solution Place 1 drop into both eyes 2 (two) times daily.      . nitroGLYCERIN (NITROSTAT) 0.4 MG SL tablet Place 0.4 mg under the tongue every 5 (five) minutes as needed.       No current facility-administered medications for this visit.   Facility-Administered Medications Ordered in Other Visits  Medication Dose Route  Frequency Provider Last Rate Last Dose  . darbepoetin (ARANESP) injection 300 mcg  300 mcg Subcutaneous Once [name], MD        Allergies-reviewed and updated No Known Allergies  History   Social History  . Marital Status: Married    Spouse Name: N/A    Number of Children: 3  . Years of Education: N/A   Occupational History  . Retired    Social History Main Topics  . Smoking status: Never Smoker   . Smokeless tobacco: Never Used     Comment: never used tobacco  . Alcohol Use: 4.2 oz/week    7 Not specified per week  . Drug Use: No  . Sexual Activity: None   Other Topics Concern  . None   Social History Narrative   Married (wife patient of Dr. 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    ROS--See HPI   Objective: BP 160/62 mmHg  Temp(Src) 97.6 F (36.4 C)  Wt 165 lb (74.844 kg) Gen: NAD, resting comfortably in chair CV: RRR no murmurs rubs or gallops Lungs: CTAB no crackles, wheeze, rhonchi Abdomen: soft/nontender/nondistended/normal bowel sounds.  Ext: trace  edema Skin: warm, dry  Neuro: grossly normal, moves all extremities, PERRLA   Assessment/Plan:  Essential hypertension Poor control on last 3 visits with systolic above 384 and with CAD want improved control. Start back on lisinopril-hctz 10-12.5mg  at lower dose than previous. Follow up 2 weeks after start with warning signs for sooner follow up given.    Hyperlipidemia  Previously controlled. Continue Simvastatin 40mg . Consider direct ldl next visit. Other labs updated by hem/onc   CAD (coronary artery disease) Asymptomatic. Continue ASA, simvastatin, imdur. Not using nitroglycerin.     Return precautions advised. 2 week follow up after starts lisinopril-hctz  Meds ordered this encounter  Medications  . cyanocobalamin ((VITAMIN B-12)) injection 1,000 mcg    Sig:   . lisinopril-hydrochlorothiazide  (PRINZIDE,ZESTORETIC) 10-12.5 MG per tablet    Sig: Take 1 tablet by mouth daily.    Dispense:  90 tablet    Refill:  3

## 2014-12-01 NOTE — Assessment & Plan Note (Signed)
Asymptomatic. Continue ASA, simvastatin, imdur. Not using nitroglycerin.

## 2014-12-08 ENCOUNTER — Telehealth: Payer: Self-pay | Admitting: Family Medicine

## 2014-12-08 MED ORDER — OMEPRAZOLE 20 MG PO CPDR: DELAYED_RELEASE_CAPSULE | ORAL | Status: DC

## 2014-12-08 NOTE — Telephone Encounter (Signed)
Pt request refill omeprazole (PRILOSEC) 20 MG capsule 90 day [facility]

## 2014-12-08 NOTE — Telephone Encounter (Signed)
Medication refilled

## 2014-12-20 ENCOUNTER — Telehealth: Payer: Self-pay | Admitting: Family Medicine

## 2014-12-20 MED ORDER — SIMVASTATIN 40 MG PO TABS: 40.0000 mg | ORAL_TABLET | Freq: Every day | ORAL | Status: DC

## 2014-12-20 NOTE — Telephone Encounter (Signed)
Needs his Simvastatin 40mg  sent to [facility] for a 90 DS.

## 2014-12-20 NOTE — Telephone Encounter (Signed)
Medication refilled

## 2014-12-21 ENCOUNTER — Other Ambulatory Visit: Payer: Self-pay | Admitting: Family

## 2014-12-23 ENCOUNTER — Ambulatory Visit (INDEPENDENT_AMBULATORY_CARE_PROVIDER_SITE_OTHER): Payer: Medicare Other | Admitting: Family Medicine

## 2014-12-23 ENCOUNTER — Encounter: Payer: Self-pay | Admitting: Family Medicine

## 2014-12-23 VITALS — BP 130/50 | Temp 97.5°F | Wt 166.0 lb

## 2014-12-23 DIAGNOSIS — E785 Hyperlipidemia, unspecified: Secondary | ICD-10-CM | POA: Diagnosis not present

## 2014-12-23 DIAGNOSIS — I10 Essential (primary) hypertension: Secondary | ICD-10-CM | POA: Diagnosis not present

## 2014-12-23 DIAGNOSIS — E538 Deficiency of other specified B group vitamins: Secondary | ICD-10-CM

## 2014-12-23 MED ORDER — CYANOCOBALAMIN 1000 MCG/ML IJ SOLN
1000.0000 ug | Freq: Once | INTRAMUSCULAR | Status: AC
  Administered 2014-12-23: 1000 ug via INTRAMUSCULAR

## 2014-12-23 NOTE — Progress Notes (Signed)
Alexander Reddish, MD Phone: [phone]  Subjective:   Alexander Rice is a 81 y.o. year old very pleasant male patient who presents with the following:  Hypertension-controlled on lisinopril-hctz 10-12.5mg   BP Readings from Last 3 Encounters:  12/23/14 130/50  12/01/14 160/62  11/30/14 167/58  Home BP monitoring-no Compliant with medications-yes without side effects, no dizziness specifically ROS-Denies any CP, HA, SOB, blurry vision, LE edema.  Hyperlipidemia-well controlled last check  Lab Results  Component Value Date   LDLCALC 26 07/21/2013  On statin: simvastatin 40mg  ROS- no chest pain or shortness of breath. No myalgias  Past Medical History- Patient Active Problem List   Diagnosis Date Noted  . CAD (coronary artery disease) 09/29/2006    Priority: High  . MDS (myelodysplastic syndrome) 05/14/2011    Priority: Medium  . Hyperlipidemia 09/29/2006    Priority: Medium  . Essential hypertension 09/29/2006    Priority: Medium  . BPH (benign prostatic hyperplasia) 09/29/2006    Priority: Medium  . Glaucoma 12/01/2014    Priority: Low  . Pulmonary nodule 04/08/2012    Priority: Low  . ANEMIA, B12 DEFICIENCY 07/03/2007    Priority: Low  . GERD 09/29/2006    Priority: Low   Medications- reviewed and updated Current Outpatient Prescriptions  Medication Sig Dispense Refill  . aspirin 81 MG tablet Take 81 mg by mouth daily.      [name] doxazosin (CARDURA) 8 MG tablet Take 1 tablet (8 mg total)  by mouth at bedtime. 90 tablet 0  . isosorbide mononitrate (IMDUR) 30 MG 24 hr tablet Take 1 tablet (30 mg total) by mouth daily. 90 tablet 0  . lisinopril-hydrochlorothiazide (PRINZIDE,ZESTORETIC) 10-12.5 MG per tablet Take 1 tablet by mouth daily. 90 tablet 3  . LUMIGAN 0.01 % SOLN Place 1 drop into both eyes daily.    . Multiple Vitamin (MULTIVITAMIN) tablet Take 1 tablet by mouth daily.      [name] omeprazole (PRILOSEC) 20 MG capsule Take 1 capsule by mouth  daily 90 capsule 0    . oxybutynin (DITROPAN) 5 MG tablet Take 5 mg by mouth 2 (two) times daily. Patient states he takes once daily.    . potassium chloride SA (K-DUR,KLOR-CON) 20 MEQ tablet Take 1 tablet (20 mEq total) by mouth daily. 90 tablet 0  . simvastatin (ZOCOR) 40 MG tablet Take 1 tablet (40 mg total) by mouth daily at 6 PM. 90 tablet 1  . dorzolamide (TRUSOPT) 2 % ophthalmic solution Place 1 drop into both eyes 2 (two) times daily.      . nitroGLYCERIN (NITROSTAT) 0.4 MG SL tablet Place 0.4 mg under the tongue every 5 (five) minutes as needed.       No current facility-administered medications for this visit.   Facility-Administered Medications Ordered in Other Visits  Medication Dose Route Frequency Provider Last Rate Last Dose  . darbepoetin (ARANESP) injection 300 mcg  300 mcg Subcutaneous Once [name], MD        Objective: BP 130/50 mmHg  Temp(Src) 97.5 F (36.4 C)  Wt 166 lb (75.297 kg) Gen: NAD, resting comfortably CV: RRR no murmurs rubs or gallops Lungs: CTAB no crackles, wheeze, rhonchi Abdomen: soft/nontender/nondistended/normal bowel sounds.  Ext: no edema Skin: warm, dry MSK: Stands without difficulty but grabs at back from history DDD, and has trouble sitting back down, able to walk out of office but antalgic gait Neuro: grossly normal, moves all extremities   Assessment/Plan:  Essential hypertension Controlled with cardura 8mg , imdur 30mg , lisinopril  10-hctz 12.5mg  (lisinopril-hctz started back within 2 weeks)    Hyperlipidemia Controlled on last check. Continue simvastatin 40mg .  Advised updating LDL today. Patient with so many needle sticks through hematology he requests to see if this can be done with next set of bloodwork with Dr. Marin Olp. I put on his avs that I would like a direct ldl for hyperlipidemia if possible, if not, we will draw on next visit.     Patient also wants a  motorized sc[place] Although he is largely asympatomatic  from his CAD, he does get fatigued with prolonged walking. In addition, he has history fo degenerative disc disease in lumbar spine which can cause moderate to severe pain with walking. He also has myelodysplastic syndrome with anemia below 9 on last 2 checks. I think he would certainly benefit from a scooter as wife also elderly and may not be able to support wheelchair. Happy to help patient coordinate this if needed through medical supply store. He is going today and will have them sned me information.   Return precautions advised. 3-4 month follow up planned.   Meds ordered this encounter  Medications  . cyanocobalamin ((VITAMIN B-12)) injection 1,000 mcg    Sig:

## 2014-12-23 NOTE — Assessment & Plan Note (Signed)
Controlled with cardura 8mg , imdur 30mg , lisinopril 10-hctz 12.5mg  (lisinopril-hctz started back within 2 weeks)

## 2014-12-23 NOTE — Assessment & Plan Note (Signed)
Controlled on last check. Continue simvastatin 40mg .  Advised updating LDL today. Patient with so many needle sticks through hematology he requests to see if this can be done with next set of bloodwork with Dr. Marin Olp. I put on his avs that I would like a direct ldl for hyperlipidemia if possible, if not, we will draw on next visit.

## 2014-12-23 NOTE — Patient Instructions (Addendum)
Blood pressure looks much better. Continue medication.   Ask Dr. Marin Olp if he could check a direct LDL ( a cholesterol test which you dont have to fast for)  Let me know how I can help about transportation for [place].   3-4 month follow up

## 2014-12-28 ENCOUNTER — Other Ambulatory Visit (HOSPITAL_BASED_OUTPATIENT_CLINIC_OR_DEPARTMENT_OTHER): Payer: Medicare Other | Admitting: Lab

## 2014-12-28 ENCOUNTER — Ambulatory Visit (HOSPITAL_BASED_OUTPATIENT_CLINIC_OR_DEPARTMENT_OTHER): Payer: Medicare Other

## 2014-12-28 ENCOUNTER — Ambulatory Visit (HOSPITAL_BASED_OUTPATIENT_CLINIC_OR_DEPARTMENT_OTHER): Payer: Medicare Other | Admitting: Family

## 2014-12-28 DIAGNOSIS — D464 Refractory anemia, unspecified: Secondary | ICD-10-CM

## 2014-12-28 DIAGNOSIS — N289 Disorder of kidney and ureter, unspecified: Secondary | ICD-10-CM

## 2014-12-28 DIAGNOSIS — D469 Myelodysplastic syndrome, unspecified: Secondary | ICD-10-CM

## 2014-12-28 DIAGNOSIS — D649 Anemia, unspecified: Secondary | ICD-10-CM

## 2014-12-28 DIAGNOSIS — D631 Anemia in chronic kidney disease: Secondary | ICD-10-CM

## 2014-12-28 DIAGNOSIS — D510 Vitamin B12 deficiency anemia due to intrinsic factor deficiency: Secondary | ICD-10-CM

## 2014-12-28 DIAGNOSIS — D518 Other vitamin B12 deficiency anemias: Secondary | ICD-10-CM

## 2014-12-28 DIAGNOSIS — I2510 Atherosclerotic heart disease of native coronary artery without angina pectoris: Secondary | ICD-10-CM | POA: Diagnosis not present

## 2014-12-28 DIAGNOSIS — N189 Chronic kidney disease, unspecified: Secondary | ICD-10-CM

## 2014-12-28 LAB — CMP (CANCER CENTER ONLY)
ALK PHOS: 38 U/L (ref 26–84)
ALT(SGPT): 18 U/L (ref 10–47)
AST: 21 U/L (ref 11–38)
Albumin: 3.6 g/dL (ref 3.3–5.5)
BILIRUBIN TOTAL: 0.8 mg/dL (ref 0.20–1.60)
BUN, Bld: 34 mg/dL — ABNORMAL HIGH (ref 7–22)
CO2: 22 meq/L (ref 18–33)
CREATININE: 1.4 mg/dL — AB (ref 0.6–1.2)
Calcium: 8.6 mg/dL (ref 8.0–10.3)
Chloride: 108 mEq/L (ref 98–108)
Glucose, Bld: 132 mg/dL — ABNORMAL HIGH (ref 73–118)
Potassium: 4.2 mEq/L (ref 3.3–4.7)
SODIUM: 140 meq/L (ref 128–145)
Total Protein: 6 g/dL — ABNORMAL LOW (ref 6.4–8.1)

## 2014-12-28 LAB — IRON AND TIBC CHCC
%SAT: 99 % — AB (ref 20–55)
IRON: 193 ug/dL — AB (ref 42–163)
TIBC: 195 ug/dL — AB (ref 202–409)
UIBC: 2 ug/dL — ABNORMAL LOW (ref 117–376)

## 2014-12-28 LAB — CHCC SATELLITE - SMEAR

## 2014-12-28 LAB — CBC WITH DIFFERENTIAL (CANCER CENTER ONLY)
BASO#: 0 10*3/uL (ref 0.0–0.2)
BASO%: 0.3 % (ref 0.0–2.0)
EOS%: 1.4 % (ref 0.0–7.0)
Eosinophils Absolute: 0.1 10*3/uL (ref 0.0–0.5)
HEMATOCRIT: 21.7 % — AB (ref 38.7–49.9)
HEMOGLOBIN: 7.1 g/dL — AB (ref 13.0–17.1)
LYMPH#: 1.3 10*3/uL (ref 0.9–3.3)
LYMPH%: 36.5 % (ref 14.0–48.0)
MCH: 35.1 pg — AB (ref 28.0–33.4)
MCHC: 32.7 g/dL (ref 32.0–35.9)
MCV: 107 fL — ABNORMAL HIGH (ref 82–98)
MONO#: 0.3 10*3/uL (ref 0.1–0.9)
MONO%: 6.9 % (ref 0.0–13.0)
NEUT%: 54.9 % (ref 40.0–80.0)
NEUTROS ABS: 2 10*3/uL (ref 1.5–6.5)
Platelets: 427 10*3/uL — ABNORMAL HIGH (ref 145–400)
RBC: 2.02 10*6/uL — ABNORMAL LOW (ref 4.20–5.70)
RDW: 14.9 % (ref 11.1–15.7)
WBC: 3.6 10*3/uL — AB (ref 4.0–10.0)

## 2014-12-28 LAB — RETICULOCYTES (CHCC)
ABS RETIC: 12.4 10*3/uL — AB (ref 19.0–186.0)
RBC.: 2.06 MIL/uL — AB (ref 4.22–5.81)
RETIC CT PCT: 0.6 % (ref 0.4–2.3)

## 2014-12-28 LAB — FERRITIN CHCC: Ferritin: 941 ng/ml — ABNORMAL HIGH (ref 22–316)

## 2014-12-28 MED ORDER — DARBEPOETIN ALFA 300 MCG/0.6ML IJ SOSY
PREFILLED_SYRINGE | INTRAMUSCULAR | Status: AC
  Filled 2014-12-28: qty 0.6

## 2014-12-28 MED ORDER — DARBEPOETIN ALFA-POLYSORBATE 300 MCG/0.6ML IJ SOLN: 300.0000 ug | INTRAMUSCULAR | Status: DC

## 2014-12-28 MED ORDER — DARBEPOETIN ALFA 300 MCG/0.6ML IJ SOSY
300.0000 ug | PREFILLED_SYRINGE | Freq: Once | INTRAMUSCULAR | Status: AC
  Administered 2014-12-28: 300 ug via SUBCUTANEOUS

## 2014-12-28 NOTE — Patient Instructions (Signed)
Darbepoetin Alfa injection What is this medicine? DARBEPOETIN ALFA (dar be POE e tin AL fa) helps your body make more red blood cells. It is used to treat anemia caused by chronic kidney failure and chemotherapy. This medicine may be used for other purposes; ask your health care provider or pharmacist if you have questions. COMMON BRAND NAME(S): Aranesp What should I tell my health care provider before I take this medicine? They need to know if you have any of these conditions: -blood clotting disorders or history of blood clots -cancer patient not on chemotherapy -cystic fibrosis -heart disease, such as angina, heart failure, or a history of a heart attack -hemoglobin level of 12 g/dL or greater -high blood pressure -low levels of folate, iron, or vitamin B12 -seizures -an unusual or allergic reaction to darbepoetin, erythropoietin, albumin, hamster proteins, latex, other medicines, foods, dyes, or preservatives -pregnant or trying to get pregnant -breast-feeding How should I use this medicine? This medicine is for injection into a vein or under the skin. It is usually given by a health care professional in a hospital or clinic setting. If you get this medicine at home, you will be taught how to prepare and give this medicine. Do not shake the solution before you withdraw a dose. Use exactly as directed. Take your medicine at regular intervals. Do not take your medicine more often than directed. It is important that you put your used needles and syringes in a special sharps container. Do not put them in a trash can. If you do not have a sharps container, call your pharmacist or healthcare provider to get one. Talk to your pediatrician regarding the use of this medicine in children. While this medicine may be used in children as young as 1 year for selected conditions, precautions do apply. Overdosage: If you think you have taken too much of this medicine contact a poison control center or  emergency room at once. NOTE: This medicine is only for you. Do not share this medicine with others. What if I miss a dose? If you miss a dose, take it as soon as you can. If it is almost time for your next dose, take only that dose. Do not take double or extra doses. What may interact with this medicine? Do not take this medicine with any of the following medications: -epoetin alfa This list may not describe all possible interactions. Give your health care provider a list of all the medicines, herbs, non-prescription drugs, or dietary supplements you use. Also tell them if you smoke, drink alcohol, or use illegal drugs. Some items may interact with your medicine. What should I watch for while using this medicine? Visit your prescriber or health care professional for regular checks on your progress and for the needed blood tests and blood pressure measurements. It is especially important for the doctor to make sure your hemoglobin level is in the desired range, to limit the risk of potential side effects and to give you the best benefit. Keep all appointments for any recommended tests. Check your blood pressure as directed. Ask your doctor what your blood pressure should be and when you should contact him or her. As your body makes more red blood cells, you may need to take iron, folic acid, or vitamin B supplements. Ask your doctor or health care provider which products are right for you. If you have kidney disease continue dietary restrictions, even though this medication can make you feel better. Talk with your doctor or health   care professional about the foods you eat and the vitamins that you take. What side effects may I notice from receiving this medicine? Side effects that you should report to your doctor or health care professional as soon as possible: -allergic reactions like skin rash, itching or hives, swelling of the face, lips, or tongue -breathing problems -changes in vision -chest  pain -confusion, trouble speaking or understanding -feeling faint or lightheaded, falls -high blood pressure -muscle aches or pains -pain, swelling, warmth in the leg -rapid weight gain -severe headaches -sudden numbness or weakness of the face, arm or leg -trouble walking, dizziness, loss of balance or coordination -seizures (convulsions) -swelling of the ankles, feet, hands -unusually weak or tired Side effects that usually do not require medical attention (report to your doctor or health care professional if they continue or are bothersome): -diarrhea -fever, chills (flu-like symptoms) -headaches -nausea, vomiting -redness, stinging, or swelling at site where injected This list may not describe all possible side effects. Call your doctor for medical advice about side effects. You may report side effects to FDA at 1-800-FDA-1088. Where should I keep my medicine? Keep out of the reach of children. Store in a refrigerator between 2 and 8 degrees C (36 and 46 degrees F). Do not freeze. Do not shake. Throw away any unused portion if using a single-dose vial. Throw away any unused medicine after the expiration date. NOTE: This sheet is a summary. It may not cover all possible information. If you have questions about this medicine, talk to your doctor, pharmacist, or health care provider.  2015, [employer]. (2008-10-19 10:23:57)  

## 2014-12-28 NOTE — Progress Notes (Signed)
[facility]  Telephone:([phone]) [phone] Fax:(336) [phone]  ID: FRANDY BASNETT OB: Feb 03, 1932 MR#: [id number] JOA#:[id number] Patient Care Team: Marin Olp, MD as PCP - General (Family Medicine) Ladene Artist, MD as Referring Physician (Gastroenterology)  DIAGNOSIS: Anemia of renal insufficiency  Refractory anemia-low-grade  Pernicious anemia  INTERVAL HISTORY: [name] is back today for a follow-up. He is still doing well and enjoying his work with the [employer]. He has no complaints today.  His appetite is good and he is staying hydrated. His weight is stable at 162 lbs. His Hgb today is 7.1 so we will give him Aranesp.  He denies fever, chills, cough, rash, headache, SOB, chest pain, palpitations, abdominal pain, constipation, diarrhea, blood in urine or stool.  He denies swelling, tenderness, numbness or tingling in his extremities.  He's had chronic back issues but hasn't been too much of a bother lately. [name]  He did have a CT scan done in May. He had a stable, tiny pleural nodules. These were noted a year ago.  CURRENT TREATMENT: Aranesp 300 mcg subcutaneous as needed for him level less than 10  REVIEW OF SYSTEMS: All other 10 point review of systems is negative except for those issues mentioned above.   PAST MEDICAL HISTORY: Past Medical History  Diagnosis Date  . CAD (coronary artery disease)   . GERD (gastroesophageal reflux disease)   . Hyperlipidemia   . Hypertension   . BPH (benign prostatic hyperplasia)   . CVD (cardiovascular disease)   . Adenomatous colon polyp 02/1996, 02/2011    TA polyp 02/2011  . B12 deficiency   . Anemia in chronic renal disease 11/02/2011  . Esophageal stricture   . Diverticulosis   . Hemorrhoids   . MDS (myelodysplastic syndrome)   . Glaucoma   . Heart palpitations   . Hiatal hernia   . History of colonic polyps 09/29/2006    Polyps age 81, Dr. Fuller Plan stated no further colonoscopy due to age     PAST  SURGICAL HISTORY: Past Surgical History  Procedure Laterality Date  . Carotid endarterectomy    . Ptca      stent  . Quadriceps repair      muscle attachment  . Colonoscopy    . Inguinal hernia repair      right side/ twice  . Tonsillectomy    . Glaucoma surgery Bilateral 11/19/12    pt's report   FAMILY HISTORY Family History  Problem Relation Age of Onset  . COPD Mother   . Alcohol abuse Father    GYNECOLOGIC HISTORY:  No LMP for male patient.   SOCIAL HISTORY:  History   Social History  . Marital Status: Married    Spouse Name: N/A    Number of Children: 3  . Years of Education: N/A   Occupational History  . Retired    Social History Main Topics  . Smoking status: Never Smoker   . Smokeless tobacco: Never Used     Comment: never used tobacco  . Alcohol Use: 4.2 oz/week    7 Not specified per week  . Drug Use: No  . Sexual Activity: Not on file   Other Topics Concern  . Not on file   Social History Narrative   Married (wife patient of Dr. 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   ADVANCED DIRECTIVES: <no information>  HEALTH MAINTENANCE: History  Substance Use Topics  . Smoking status: Never Smoker   . Smokeless tobacco: Never Used     Comment: never used tobacco  . Alcohol Use: 4.2 oz/week    7 Not specified per week   Colonoscopy: PAP: Bone density: Lipid panel:  No Known Allergies  Current Outpatient Prescriptions  Medication Sig Dispense Refill  . aspirin 81 MG tablet Take 81 mg by mouth daily.      . dorzolamide (TRUSOPT) 2 % ophthalmic solution Place 1 drop into both eyes 2 (two) times daily.      [name] doxazosin (CARDURA) 8 MG tablet Take 1 tablet (8 mg total)  by mouth at bedtime. 90 tablet 0  . isosorbide mononitrate (IMDUR) 30 MG 24 hr tablet Take 1 tablet (30 mg total) by mouth daily. 90 tablet 0  . lisinopril-hydrochlorothiazide  (PRINZIDE,ZESTORETIC) 10-12.5 MG per tablet Take 1 tablet by mouth daily. 90 tablet 3  . LUMIGAN 0.01 % SOLN Place 1 drop into both eyes daily.    . Multiple Vitamin (MULTIVITAMIN) tablet Take 1 tablet by mouth daily.      . nitroGLYCERIN (NITROSTAT) 0.4 MG SL tablet Place 0.4 mg under the tongue every 5 (five) minutes as needed.      [name] omeprazole (PRILOSEC) 20 MG capsule Take 1 capsule by mouth  daily 90 capsule 0  . oxybutynin (DITROPAN) 5 MG tablet Take 5 mg by mouth 2 (two) times daily. Patient states he takes once daily.    . potassium chloride SA (K-DUR,KLOR-CON) 20 MEQ tablet Take 1 tablet (20 mEq total) by mouth daily. 90 tablet 0  . simvastatin (ZOCOR) 40 MG tablet Take 1 tablet (40 mg total) by mouth daily at 6 PM. 90 tablet 1   No current facility-administered medications for this visit.   Facility-Administered Medications Ordered in Other Visits  Medication Dose Route Frequency Provider Last Rate Last Dose  . darbepoetin (ARANESP) injection 300 mcg  300 mcg Subcutaneous Once [name], MD       OBJECTIVE: There were no vitals filed for this visit. There is no weight on file to calculate BMI. ECOG FS:0 - Asymptomatic Ocular: Sclerae unicteric, pupils equal, round and reactive to light Ear-nose-throat: Oropharynx clear, dentition fair Lymphatic: No cervical or supraclavicular adenopathy Lungs no rales or rhonchi, good excursion bilaterally Heart regular rate and rhythm, no murmur appreciated Abd soft, nontender, positive bowel sounds MSK no focal spinal tenderness, no joint edema Neuro: non-focal, well-oriented, appropriate affect Breasts: Deferred  LAB RESULTS: CMP     Component Value Date/Time   NA 140 12/28/2014 1022   NA 140 11/30/2014 0912   K 4.2 12/28/2014 1022   K 4.1 11/30/2014 0912   CL 108 12/28/2014 1022   CL 108 11/30/2014 0912   CO2 22 12/28/2014 1022   CO2 24 11/30/2014 0912   GLUCOSE 132* 12/28/2014 1022   GLUCOSE 112* 11/30/2014 0912   BUN 34*  12/28/2014 1022   BUN 24* 11/30/2014 0912   CREATININE 1.4* 12/28/2014 1022   CREATININE 1.06 11/30/2014 0912   CALCIUM 8.6 12/28/2014 1022   CALCIUM 8.8 11/30/2014 0912   PROT 6.0* 12/28/2014 1022   PROT 5.9* 11/30/2014 0912   ALBUMIN 4.0 11/30/2014 0912   AST 21 12/28/2014 1022   AST 18 11/30/2014 0912   ALT 18 12/28/2014 1022   ALT 14 11/30/2014 0912   ALKPHOS 38 12/28/2014 1022   ALKPHOS 49 11/30/2014 0912   BILITOT 0.80 12/28/2014 1022  BILITOT 0.9 11/30/2014 0912   GFRNONAA >60 04/26/2011 0445   GFRAA  04/26/2011 0445    >60        The eGFR has been calculated using the MDRD equation. This calculation has not been validated in all clinical situations. eGFR's persistently <60 mL/min signify possible Chronic Kidney Disease.   No results found for: SPEP Lab Results  Component Value Date   WBC 3.6* 12/28/2014   NEUTROABS 2.0 12/28/2014   HGB 7.1* 12/28/2014   HCT 21.7* 12/28/2014   MCV 107* 12/28/2014   PLT 427* 12/28/2014   No results found for: LABCA2 No components found for: [id number] No results for input(s): INR in the last 168 hours.  STUDIES: No results found.  ASSESSMENT/PLAN: Mr. Bona is pleasant 81 year old gentleman with low-grade myelodysplasia. He has refractory anemia. He also has pernicious anemia and anemia of renal insufficiency. He is getting along quite well staying active in the community. He is asymptomatic at this time.  His Hgb is 7.1 so we will give him Aranesp today. We will see him back for labs and follow-up in 1 month. I also added a direct LDL to his labs per Dr. Ansel Bong request.  All questions were answered and he is in agreement with the plan. He knows to call here with any questions, we can certainly see him sooner if need be.  Eliezer Bottom, NP 12/28/2014 11:00 AM

## 2014-12-30 ENCOUNTER — Encounter: Payer: Self-pay | Admitting: Hematology & Oncology

## 2015-01-05 DIAGNOSIS — R350 Frequency of micturition: Secondary | ICD-10-CM | POA: Diagnosis not present

## 2015-01-05 DIAGNOSIS — R351 Nocturia: Secondary | ICD-10-CM | POA: Diagnosis not present

## 2015-01-06 ENCOUNTER — Telehealth: Payer: Self-pay | Admitting: Family Medicine

## 2015-01-06 MED ORDER — ISOSORBIDE MONONITRATE ER 30 MG PO TB24: 30.0000 mg | ORAL_TABLET | Freq: Every day | ORAL | Status: DC

## 2015-01-06 NOTE — Telephone Encounter (Signed)
Medication refilled

## 2015-01-06 NOTE — Telephone Encounter (Signed)
Pt request refill isosorbide mononitrate (IMDUR) 30 MG 24 hr tablet 90 tab [facility]

## 2015-01-17 ENCOUNTER — Telehealth: Payer: Self-pay | Admitting: Family Medicine

## 2015-01-17 MED ORDER — DOXAZOSIN MESYLATE 8 MG PO TABS: ORAL_TABLET | ORAL | Status: DC

## 2015-01-17 NOTE — Telephone Encounter (Signed)
Pt request refill of the following: doxazosin (CARDURA) 8 MG tablet   Phamacy: [facility]

## 2015-01-17 NOTE — Telephone Encounter (Signed)
Medication refilled

## 2015-01-25 ENCOUNTER — Ambulatory Visit (HOSPITAL_BASED_OUTPATIENT_CLINIC_OR_DEPARTMENT_OTHER): Payer: Medicare Other

## 2015-01-25 ENCOUNTER — Other Ambulatory Visit (HOSPITAL_BASED_OUTPATIENT_CLINIC_OR_DEPARTMENT_OTHER): Payer: Medicare Other | Admitting: Lab

## 2015-01-25 ENCOUNTER — Ambulatory Visit (HOSPITAL_COMMUNITY)
Admission: RE | Admit: 2015-01-25 | Discharge: 2015-01-25 | Disposition: A | Discharge status: Home / Self Care | Payer: Medicare Other | Source: Ambulatory Visit | Attending: Hematology & Oncology | Admitting: Hematology & Oncology

## 2015-01-25 ENCOUNTER — Other Ambulatory Visit: Payer: Self-pay | Admitting: *Deleted

## 2015-01-25 ENCOUNTER — Ambulatory Visit (HOSPITAL_BASED_OUTPATIENT_CLINIC_OR_DEPARTMENT_OTHER): Payer: Medicare Other | Admitting: Family

## 2015-01-25 ENCOUNTER — Encounter: Payer: Self-pay | Admitting: Family

## 2015-01-25 DIAGNOSIS — D469 Myelodysplastic syndrome, unspecified: Secondary | ICD-10-CM

## 2015-01-25 DIAGNOSIS — D510 Vitamin B12 deficiency anemia due to intrinsic factor deficiency: Secondary | ICD-10-CM

## 2015-01-25 DIAGNOSIS — D649 Anemia, unspecified: Secondary | ICD-10-CM

## 2015-01-25 DIAGNOSIS — I2510 Atherosclerotic heart disease of native coronary artery without angina pectoris: Secondary | ICD-10-CM

## 2015-01-25 DIAGNOSIS — D464 Refractory anemia, unspecified: Secondary | ICD-10-CM | POA: Diagnosis not present

## 2015-01-25 DIAGNOSIS — D518 Other vitamin B12 deficiency anemias: Secondary | ICD-10-CM

## 2015-01-25 DIAGNOSIS — N289 Disorder of kidney and ureter, unspecified: Secondary | ICD-10-CM

## 2015-01-25 LAB — CBC WITH DIFFERENTIAL (CANCER CENTER ONLY)
BASO#: 0 10*3/uL (ref 0.0–0.2)
BASO%: 0.3 % (ref 0.0–2.0)
EOS%: 2.5 % (ref 0.0–7.0)
Eosinophils Absolute: 0.1 10*3/uL (ref 0.0–0.5)
HCT: 20.4 % — ABNORMAL LOW (ref 38.7–49.9)
HGB: 6.7 g/dL — CL (ref 13.0–17.1)
LYMPH#: 1.2 10*3/uL (ref 0.9–3.3)
LYMPH%: 34.3 % (ref 14.0–48.0)
MCH: 35.4 pg — ABNORMAL HIGH (ref 28.0–33.4)
MCHC: 32.8 g/dL (ref 32.0–35.9)
MCV: 108 fL — ABNORMAL HIGH (ref 82–98)
MONO#: 0.2 10*3/uL (ref 0.1–0.9)
MONO%: 6.1 % (ref 0.0–13.0)
NEUT%: 56.8 % (ref 40.0–80.0)
NEUTROS ABS: 2.1 10*3/uL (ref 1.5–6.5)
PLATELETS: 384 10*3/uL (ref 145–400)
RBC: 1.89 10*6/uL — AB (ref 4.20–5.70)
RDW: 16.1 % — ABNORMAL HIGH (ref 11.1–15.7)
WBC: 3.6 10*3/uL — AB (ref 4.0–10.0)

## 2015-01-25 LAB — CMP (CANCER CENTER ONLY)
ALT(SGPT): 16 U/L (ref 10–47)
AST: 19 U/L (ref 11–38)
Albumin: 3.8 g/dL (ref 3.3–5.5)
Alkaline Phosphatase: 44 U/L (ref 26–84)
BUN: 48 mg/dL — AB (ref 7–22)
CALCIUM: 9 mg/dL (ref 8.0–10.3)
CHLORIDE: 107 meq/L (ref 98–108)
CO2: 23 meq/L (ref 18–33)
CREATININE: 1.7 mg/dL — AB (ref 0.6–1.2)
Glucose, Bld: 102 mg/dL (ref 73–118)
Potassium: 4.9 mEq/L — ABNORMAL HIGH (ref 3.3–4.7)
Sodium: 142 mEq/L (ref 128–145)
Total Bilirubin: 0.8 mg/dl (ref 0.20–1.60)
Total Protein: 6.2 g/dL — ABNORMAL LOW (ref 6.4–8.1)

## 2015-01-25 LAB — FERRITIN CHCC: FERRITIN: 946 ng/mL — AB (ref 22–316)

## 2015-01-25 LAB — IRON AND TIBC CHCC
%SAT: >100 % (ref 20–?)
Iron: 197 ug/dL — ABNORMAL HIGH (ref 42–163)
TIBC: 195 ug/dL — ABNORMAL LOW (ref 202–409)
UIBC: <1 ug/dL (ref 117–376)

## 2015-01-25 LAB — HOLD TUBE, BLOOD BANK - CHCC SATELLITE

## 2015-01-25 LAB — CHCC SATELLITE - SMEAR

## 2015-01-25 LAB — RETICULOCYTES (CHCC)
ABS Retic: 11.5 10*3/uL — ABNORMAL LOW (ref 19.0–186.0)
RBC.: 1.91 MIL/uL — AB (ref 4.22–5.81)
RETIC CT PCT: 0.6 % (ref 0.4–2.3)

## 2015-01-25 LAB — ABO/RH: ABO/RH(D): A POS

## 2015-01-25 MED ORDER — DARBEPOETIN ALFA 500 MCG/ML IJ SOSY
PREFILLED_SYRINGE | INTRAMUSCULAR | Status: AC
  Filled 2015-01-25: qty 1

## 2015-01-25 MED ORDER — DARBEPOETIN ALFA 500 MCG/ML IJ SOSY
500.0000 ug | PREFILLED_SYRINGE | Freq: Once | INTRAMUSCULAR | Status: AC
Start: 2015-01-25 — End: 2015-01-25
  Administered 2015-01-25: 500 ug via SUBCUTANEOUS

## 2015-01-25 NOTE — Progress Notes (Signed)
Hematology and Oncology Follow Up Visit  Alexander Rice [id number] July 26, 1932 82 y.o. 01/25/2015   Principle Diagnosis:  Anemia of renal insufficiency  Refractory anemia-low-grade  Pernicious anemia  Current Therapy:   Aranesp 300 mcg subcutaneous as needed for him level less than 10    Interim History: Alexander Rice is back today for a follow-up. He is still doing well and preparing to go to the [employer] He ordered his scooter yesterday.  His appetite is good but he is down 9 lbs since his last visit. He denies appetite changes and states that he is drinking fluids.  His Hgb today is down to 6.7. We will give him Aranesp 500 mcg SQ today and have him come back in at 8:30 tomorrow morning for 2 units of blood.  I did hemoccult test him today and it was negative.   He denies fatigue, fever, chills, cough, rash, headache, SOB, chest pain, palpitations, abdominal pain, constipation, diarrhea, blood in urine or stool. No episodes of bleeding.  He denies swelling, tenderness, numbness or tingling in his extremities. No new aches or pains. He's had chronic back issues but hasn't been too much of a bother lately. [name]  He did have a CT scan done in May 2015. He had a stable, tiny pleural nodules. These were also noted in 2014.  Medications:    Medication List       This list is accurate as of: 01/25/15 11:39 AM.  Always use your most recent med list.               aspirin 81 MG tablet  Take 81 mg by mouth daily.     dorzolamide 2 % ophthalmic solution  Commonly known as:  TRUSOPT  Place 1 drop into both eyes 2 (two) times daily.     doxazosin 8 MG tablet  Commonly known as:  CARDURA  Take 1 tablet (8 mg total)  by mouth at bedtime.     isosorbide mononitrate 30 MG 24 hr tablet  Commonly known as:  IMDUR  Take 1 tablet (30 mg total) by mouth daily.     lisinopril-hydrochlorothiazide 10-12.5 MG per tablet  Commonly known as:  PRINZIDE,ZESTORETIC  Take 1  tablet by mouth daily.     LUMIGAN 0.01 % Soln  Generic drug:  bimatoprost  Place 1 drop into both eyes daily.     multivitamin tablet  Take 1 tablet by mouth daily.     nitroGLYCERIN 0.4 MG SL tablet  Commonly known as:  NITROSTAT  Place 0.4 mg under the tongue every 5 (five) minutes as needed.     omeprazole 20 MG capsule  Commonly known as:  PRILOSEC  Take 1 capsule by mouth  daily     oxybutynin 5 MG tablet  Commonly known as:  DITROPAN  Take 5 mg by mouth 2 (two) times daily. Patient states he takes once daily.     potassium chloride SA 20 MEQ tablet  Commonly known as:  K-DUR,KLOR-CON  Take 1 tablet (20 mEq total) by mouth daily.     simvastatin 40 MG tablet  Commonly known as:  ZOCOR  Take 1 tablet (40 mg total) by mouth daily at 6 PM.        Allergies: No Known Allergies  Past Medical History, Surgical history, Social history, and Family History were reviewed and updated.  Review of Systems: All other 10 point review of systems is negative.   Physical Exam:  height is  5\' 5"  (1.651 m) and weight is 157 lb (71.215 kg). His oral temperature is 98 F (36.7 C). His blood pressure is 129/41 and his pulse is 65. His respiration is 18.   Wt Readings from Last 3 Encounters:  01/25/15 157 lb (71.215 kg)  12/23/14 166 lb (75.297 kg)  12/01/14 165 lb (74.844 kg)    Ocular: Sclerae unicteric, pupils equal, round and reactive to light Ear-nose-throat: Oropharynx clear, dentition fair Lymphatic: No cervical or supraclavicular adenopathy Lungs no rales or rhonchi, good excursion bilaterally Heart regular rate and rhythm, no murmur appreciated Abd soft, nontender, positive bowel sounds MSK no focal spinal tenderness, no joint edema Neuro: non-focal, well-oriented, appropriate affect  Lab Results  Component Value Date   WBC 3.6* 01/25/2015   HGB 6.7* 01/25/2015   HCT 20.4* 01/25/2015   MCV 108* 01/25/2015   PLT 384 01/25/2015   Lab Results  Component Value  Date   FERRITIN 941* 12/28/2014   IRON 193* 12/28/2014   TIBC 195* 12/28/2014   UIBC 2* 12/28/2014   IRONPCTSAT 99* 12/28/2014   Lab Results  Component Value Date   RETICCTPCT 0.6 12/28/2014   RBC 1.89* 01/25/2015   RETICCTABS 12.4* 12/28/2014   Lab Results  Component Value Date   KPAFRELGTCHN 3.09* 04/22/2008   LAMBDASER 1.01 04/22/2008   KAPLAMBRATIO 3.06* 04/22/2008   No results found for: Kandis Cocking, IGMSERUM Lab Results  Component Value Date   TOTALPROTELP 6.3 04/22/2008   ALBUMINELP 66.3* 04/22/2008   A1GS 3.9 04/22/2008   A2GS 8.1 04/22/2008   BETS 5.2 04/22/2008   BETA2SER 4.0 04/22/2008   GAMS 12.5 04/22/2008   MSPIKE NOT DET 04/22/2008   SPEI * 04/22/2008     Chemistry      Component Value Date/Time   NA 142 01/25/2015 1015   NA 140 11/30/2014 0912   K 4.9* 01/25/2015 1015   K 4.1 11/30/2014 0912   CL 107 01/25/2015 1015   CL 108 11/30/2014 0912   CO2 23 01/25/2015 1015   CO2 24 11/30/2014 0912   BUN 48* 01/25/2015 1015   BUN 24* 11/30/2014 0912   CREATININE 1.7* 01/25/2015 1015   CREATININE 1.06 11/30/2014 0912      Component Value Date/Time   CALCIUM 9.0 01/25/2015 1015   CALCIUM 8.8 11/30/2014 0912   ALKPHOS 44 01/25/2015 1015   ALKPHOS 49 11/30/2014 0912   AST 19 01/25/2015 1015   AST 18 11/30/2014 0912   ALT 16 01/25/2015 1015   ALT 14 11/30/2014 0912   BILITOT 0.80 01/25/2015 1015   BILITOT 0.9 11/30/2014 0912     Impression and Plan: Alexander Rice is pleasant a 81 year old gentleman with low-grade myelodysplasia. He has refractory anemia. He also has pernicious anemia and anemia of renal insufficiency. He is getting along quite well staying active in the community. He has lost 9 lbs since his last visit with no changes in his appetite. He is working out more at [employer]. We will keep an eye on this.  His Hgb is down to 6.7. He has had no episode of bleeding and hemoccult test was negative.  We will give him Aranesp today and have him  come back in tomorrow at 8:30 for 2 units of blood.  We will see him back for labs and follow-up in 3 weeks.  All questions were answered and he is in agreement with the plan. He knows to call here with any questions or concerns and to go to the ED in  the event of an emergency. We can certainly see him sooner if need be.  Eliezer Bottom, NP 3/8/201611:39 AM

## 2015-01-25 NOTE — Patient Instructions (Signed)
Darbepoetin Alfa injection What is this medicine? DARBEPOETIN ALFA (dar be POE e tin AL fa) helps your body make more red blood cells. It is used to treat anemia caused by chronic kidney failure and chemotherapy. This medicine may be used for other purposes; ask your health care provider or pharmacist if you have questions. COMMON BRAND NAME(S): Aranesp What should I tell my health care provider before I take this medicine? They need to know if you have any of these conditions: -blood clotting disorders or history of blood clots -cancer patient not on chemotherapy -cystic fibrosis -heart disease, such as angina, heart failure, or a history of a heart attack -hemoglobin level of 12 g/dL or greater -high blood pressure -low levels of folate, iron, or vitamin B12 -seizures -an unusual or allergic reaction to darbepoetin, erythropoietin, albumin, hamster proteins, latex, other medicines, foods, dyes, or preservatives -pregnant or trying to get pregnant -breast-feeding How should I use this medicine? This medicine is for injection into a vein or under the skin. It is usually given by a health care professional in a hospital or clinic setting. If you get this medicine at home, you will be taught how to prepare and give this medicine. Do not shake the solution before you withdraw a dose. Use exactly as directed. Take your medicine at regular intervals. Do not take your medicine more often than directed. It is important that you put your used needles and syringes in a special sharps container. Do not put them in a trash can. If you do not have a sharps container, call your pharmacist or healthcare provider to get one. Talk to your pediatrician regarding the use of this medicine in children. While this medicine may be used in children as young as 1 year for selected conditions, precautions do apply. Overdosage: If you think you have taken too much of this medicine contact a poison control center or  emergency room at once. NOTE: This medicine is only for you. Do not share this medicine with others. What if I miss a dose? If you miss a dose, take it as soon as you can. If it is almost time for your next dose, take only that dose. Do not take double or extra doses. What may interact with this medicine? Do not take this medicine with any of the following medications: -epoetin alfa This list may not describe all possible interactions. Give your health care provider a list of all the medicines, herbs, non-prescription drugs, or dietary supplements you use. Also tell them if you smoke, drink alcohol, or use illegal drugs. Some items may interact with your medicine. What should I watch for while using this medicine? Visit your prescriber or health care professional for regular checks on your progress and for the needed blood tests and blood pressure measurements. It is especially important for the doctor to make sure your hemoglobin level is in the desired range, to limit the risk of potential side effects and to give you the best benefit. Keep all appointments for any recommended tests. Check your blood pressure as directed. Ask your doctor what your blood pressure should be and when you should contact him or her. As your body makes more red blood cells, you may need to take iron, folic acid, or vitamin B supplements. Ask your doctor or health care provider which products are right for you. If you have kidney disease continue dietary restrictions, even though this medication can make you feel better. Talk with your doctor or health   care professional about the foods you eat and the vitamins that you take. What side effects may I notice from receiving this medicine? Side effects that you should report to your doctor or health care professional as soon as possible: -allergic reactions like skin rash, itching or hives, swelling of the face, lips, or tongue -breathing problems -changes in vision -chest  pain -confusion, trouble speaking or understanding -feeling faint or lightheaded, falls -high blood pressure -muscle aches or pains -pain, swelling, warmth in the leg -rapid weight gain -severe headaches -sudden numbness or weakness of the face, arm or leg -trouble walking, dizziness, loss of balance or coordination -seizures (convulsions) -swelling of the ankles, feet, hands -unusually weak or tired Side effects that usually do not require medical attention (report to your doctor or health care professional if they continue or are bothersome): -diarrhea -fever, chills (flu-like symptoms) -headaches -nausea, vomiting -redness, stinging, or swelling at site where injected This list may not describe all possible side effects. Call your doctor for medical advice about side effects. You may report side effects to FDA at 1-800-FDA-1088. Where should I keep my medicine? Keep out of the reach of children. Store in a refrigerator between 2 and 8 degrees C (36 and 46 degrees F). Do not freeze. Do not shake. Throw away any unused portion if using a single-dose vial. Throw away any unused medicine after the expiration date. NOTE: This sheet is a summary. It may not cover all possible information. If you have questions about this medicine, talk to your doctor, pharmacist, or health care provider.  2015, [employer]. (2008-10-19 10:23:57)  

## 2015-01-26 ENCOUNTER — Ambulatory Visit (HOSPITAL_BASED_OUTPATIENT_CLINIC_OR_DEPARTMENT_OTHER): Payer: Medicare Other

## 2015-01-26 VITALS — BP 117/87 | HR 58 | Temp 98.2°F | Resp 20

## 2015-01-26 DIAGNOSIS — D469 Myelodysplastic syndrome, unspecified: Secondary | ICD-10-CM | POA: Diagnosis not present

## 2015-01-26 LAB — PREPARE RBC (CROSSMATCH)

## 2015-01-26 MED ORDER — ACETAMINOPHEN 325 MG PO TABS
ORAL_TABLET | ORAL | Status: AC
  Filled 2015-01-26: qty 2

## 2015-01-26 MED ORDER — DIPHENHYDRAMINE HCL 25 MG PO CAPS
ORAL_CAPSULE | ORAL | Status: AC
  Filled 2015-01-26: qty 1

## 2015-01-26 MED ORDER — SODIUM CHLORIDE 0.9 % IV SOLN
250.0000 mL | Freq: Once | INTRAVENOUS | Status: AC
Start: 2015-01-26 — End: 2015-01-26
  Administered 2015-01-26: 250 mL via INTRAVENOUS

## 2015-01-26 MED ORDER — ACETAMINOPHEN 325 MG PO TABS
650.0000 mg | ORAL_TABLET | Freq: Once | ORAL | Status: AC
  Administered 2015-01-26: 650 mg via ORAL

## 2015-01-26 MED ORDER — DIPHENHYDRAMINE HCL 25 MG PO CAPS
25.0000 mg | ORAL_CAPSULE | Freq: Once | ORAL | Status: AC
Start: 2015-01-26 — End: 2015-01-26
  Administered 2015-01-26: 25 mg via ORAL

## 2015-01-26 NOTE — Patient Instructions (Signed)

## 2015-01-27 ENCOUNTER — Encounter: Payer: Self-pay | Admitting: Hematology & Oncology

## 2015-01-27 LAB — TYPE AND SCREEN
ABO/RH(D): A POS
Antibody Screen: NEGATIVE
UNIT DIVISION: 0
Unit division: 0

## 2015-02-01 ENCOUNTER — Telehealth: Payer: Self-pay | Admitting: Family Medicine

## 2015-02-01 ENCOUNTER — Ambulatory Visit: Payer: Medicare Other | Admitting: Family Medicine

## 2015-02-01 DIAGNOSIS — R413 Other amnesia: Secondary | ICD-10-CM

## 2015-02-01 NOTE — Telephone Encounter (Signed)
I would be happy to see them come in together with daughter, wife, and patient for further evaluation. Would need to do initial memory loss evaluation in person.

## 2015-02-01 NOTE — Telephone Encounter (Signed)
Pt daughter Curt Bears states she has been noticing a form of dementia in her dad and when they tell him that he has a problem he tells them that he is fine and she would like for you to be the one to tell him that he needs to see a neurologist and she would like to speak with you personally over the phone regarding this matter. Curt Bears is on pt DPR.

## 2015-02-01 NOTE — Telephone Encounter (Signed)
Daughter Curt Bears would like a cb regarding a referral to a neurologist for pt.  They cannot persuade pt to see one, hoping you would be able to that. Would like dr hunter to cb.

## 2015-02-02 ENCOUNTER — Ambulatory Visit: Payer: Medicare Other | Admitting: Family Medicine

## 2015-02-02 ENCOUNTER — Ambulatory Visit (INDEPENDENT_AMBULATORY_CARE_PROVIDER_SITE_OTHER): Payer: Medicare Other

## 2015-02-02 DIAGNOSIS — D518 Other vitamin B12 deficiency anemias: Secondary | ICD-10-CM

## 2015-02-02 MED ORDER — CYANOCOBALAMIN 1000 MCG/ML IJ SOLN
1000.0000 ug | Freq: Once | INTRAMUSCULAR | Status: AC
  Administered 2015-02-02: 1000 ug via INTRAMUSCULAR

## 2015-02-02 NOTE — Telephone Encounter (Signed)
I relayed this to Curt Bears and she is still requesting a CB from you personally.

## 2015-02-02 NOTE — Telephone Encounter (Signed)
Keba please refer patient to neurology for memory loss.  Please call patient and tell him I have placed referral due to some concerns from family about his memory.        This is for documentation only but not to be discussed with patient below this line: I told daughter Alexander Rice that my strong preference would be to have him come in for an evaluation of this to perform MMSE and evaluate memory loss and consider workup for reversible causes through labs and potential imaging. She was not interested in this option and wanted only to go to neurology. They wanted me to tell patient I had picked up on some things during our visit which I had not and I told them I could only tell him I am referring him due to family concerns as stated above.

## 2015-02-03 ENCOUNTER — Telehealth: Payer: Self-pay | Admitting: Family Medicine

## 2015-02-03 ENCOUNTER — Ambulatory Visit: Payer: Medicare Other | Admitting: Family Medicine

## 2015-02-03 NOTE — Telephone Encounter (Signed)
noted 

## 2015-02-03 NOTE — Telephone Encounter (Signed)
Daughter called to say the neuro referral is ok to proceed with.  [facility] has called pt to schedule and garvey westcott is now aware and will call them back to schedule an appt.  She states you do not need to cb. All is good

## 2015-02-03 NOTE — Telephone Encounter (Signed)
Referral entered and lm on pt vm tcb

## 2015-02-17 DIAGNOSIS — B351 Tinea unguium: Secondary | ICD-10-CM | POA: Diagnosis not present

## 2015-02-17 DIAGNOSIS — M79674 Pain in right toe(s): Secondary | ICD-10-CM | POA: Diagnosis not present

## 2015-02-18 ENCOUNTER — Telehealth: Payer: Self-pay | Admitting: Family Medicine

## 2015-02-18 MED ORDER — POTASSIUM CHLORIDE CRYS ER 20 MEQ PO TBCR: 20.0000 meq | EXTENDED_RELEASE_TABLET | Freq: Every day | ORAL | Status: DC

## 2015-02-18 NOTE — Telephone Encounter (Signed)
Medication refilled

## 2015-02-18 NOTE — Telephone Encounter (Signed)
Pt needs refill on potassium chloride 20 meq [facility]

## 2015-02-21 ENCOUNTER — Other Ambulatory Visit: Payer: Medicare Other | Admitting: Lab

## 2015-02-21 ENCOUNTER — Ambulatory Visit: Payer: Medicare Other | Admitting: Hematology & Oncology

## 2015-02-21 ENCOUNTER — Ambulatory Visit: Payer: Medicare Other

## 2015-02-22 ENCOUNTER — Ambulatory Visit: Payer: Medicare Other | Admitting: Hematology & Oncology

## 2015-02-22 ENCOUNTER — Ambulatory Visit: Payer: Medicare Other

## 2015-02-22 ENCOUNTER — Other Ambulatory Visit: Payer: Medicare Other | Admitting: Lab

## 2015-03-01 ENCOUNTER — Telehealth: Payer: Self-pay | Admitting: *Deleted

## 2015-03-01 ENCOUNTER — Other Ambulatory Visit (HOSPITAL_BASED_OUTPATIENT_CLINIC_OR_DEPARTMENT_OTHER): Payer: Medicare Other

## 2015-03-01 ENCOUNTER — Encounter: Payer: Self-pay | Admitting: Hematology & Oncology

## 2015-03-01 ENCOUNTER — Ambulatory Visit (HOSPITAL_BASED_OUTPATIENT_CLINIC_OR_DEPARTMENT_OTHER): Payer: Medicare Other | Admitting: Hematology & Oncology

## 2015-03-01 ENCOUNTER — Ambulatory Visit (HOSPITAL_BASED_OUTPATIENT_CLINIC_OR_DEPARTMENT_OTHER): Payer: Medicare Other

## 2015-03-01 VITALS — BP 128/48 | HR 74 | Temp 97.6°F | Resp 20 | Ht 65.0 in | Wt 165.0 lb

## 2015-03-01 DIAGNOSIS — D510 Vitamin B12 deficiency anemia due to intrinsic factor deficiency: Secondary | ICD-10-CM

## 2015-03-01 DIAGNOSIS — D464 Refractory anemia, unspecified: Secondary | ICD-10-CM

## 2015-03-01 DIAGNOSIS — N289 Disorder of kidney and ureter, unspecified: Secondary | ICD-10-CM | POA: Diagnosis not present

## 2015-03-01 DIAGNOSIS — D469 Myelodysplastic syndrome, unspecified: Secondary | ICD-10-CM

## 2015-03-01 DIAGNOSIS — D649 Anemia, unspecified: Secondary | ICD-10-CM

## 2015-03-01 DIAGNOSIS — N259 Disorder resulting from impaired renal tubular function, unspecified: Secondary | ICD-10-CM

## 2015-03-01 DIAGNOSIS — D518 Other vitamin B12 deficiency anemias: Secondary | ICD-10-CM

## 2015-03-01 LAB — CBC WITH DIFFERENTIAL (CANCER CENTER ONLY)
BASO#: 0 10*3/uL (ref 0.0–0.2)
BASO%: 0.2 % (ref 0.0–2.0)
EOS ABS: 0.2 10*3/uL (ref 0.0–0.5)
EOS%: 4 % (ref 0.0–7.0)
HEMATOCRIT: 20.2 % — AB (ref 38.7–49.9)
HGB: 6.8 g/dL — CL (ref 13.0–17.1)
LYMPH#: 1.1 10*3/uL (ref 0.9–3.3)
LYMPH%: 25.6 % (ref 14.0–48.0)
MCH: 35.1 pg — ABNORMAL HIGH (ref 28.0–33.4)
MCHC: 33.7 g/dL (ref 32.0–35.9)
MCV: 104 fL — AB (ref 82–98)
MONO#: 0.4 10*3/uL (ref 0.1–0.9)
MONO%: 8.9 % (ref 0.0–13.0)
NEUT#: 2.6 10*3/uL (ref 1.5–6.5)
NEUT%: 61.3 % (ref 40.0–80.0)
Platelets: 443 10*3/uL — ABNORMAL HIGH (ref 145–400)
RBC: 1.94 10*6/uL — ABNORMAL LOW (ref 4.20–5.70)
RDW: 16.2 % — ABNORMAL HIGH (ref 11.1–15.7)
WBC: 4.3 10*3/uL (ref 4.0–10.0)

## 2015-03-01 LAB — CHCC SATELLITE - SMEAR

## 2015-03-01 LAB — RETICULOCYTES (CHCC)
ABS RETIC: 13.5 10*3/uL — AB (ref 19.0–186.0)
RBC.: 1.93 MIL/uL — ABNORMAL LOW (ref 4.22–5.81)
Retic Ct Pct: 0.7 % (ref 0.4–2.3)

## 2015-03-01 LAB — CMP (CANCER CENTER ONLY)
ALK PHOS: 35 U/L (ref 26–84)
ALT(SGPT): 18 U/L (ref 10–47)
AST: 26 U/L (ref 11–38)
Albumin: 3.5 g/dL (ref 3.3–5.5)
BUN, Bld: 30 mg/dL — ABNORMAL HIGH (ref 7–22)
CALCIUM: 8.9 mg/dL (ref 8.0–10.3)
CO2: 25 mEq/L (ref 18–33)
Chloride: 106 mEq/L (ref 98–108)
Creat: 1.4 mg/dl — ABNORMAL HIGH (ref 0.6–1.2)
GLUCOSE: 106 mg/dL (ref 73–118)
Potassium: 4.5 mEq/L (ref 3.3–4.7)
Sodium: 139 mEq/L (ref 128–145)
Total Bilirubin: 0.6 mg/dl (ref 0.20–1.60)
Total Protein: 5.5 g/dL — ABNORMAL LOW (ref 6.4–8.1)

## 2015-03-01 LAB — IRON AND TIBC CHCC
%SAT: 46 % (ref 20–55)
IRON: 79 ug/dL (ref 42–163)
TIBC: 171 ug/dL — ABNORMAL LOW (ref 202–409)
UIBC: 92 ug/dL — ABNORMAL LOW (ref 117–376)

## 2015-03-01 LAB — FERRITIN CHCC: Ferritin: 1122 ng/ml — ABNORMAL HIGH (ref 22–316)

## 2015-03-01 LAB — HOLD TUBE, BLOOD BANK - CHCC SATELLITE

## 2015-03-01 MED ORDER — DARBEPOETIN ALFA 500 MCG/ML IJ SOSY: 500.0000 ug | PREFILLED_SYRINGE | Freq: Once | INTRAMUSCULAR | Status: DC

## 2015-03-01 MED ORDER — DARBEPOETIN ALFA 500 MCG/ML IJ SOSY
PREFILLED_SYRINGE | INTRAMUSCULAR | Status: AC
  Filled 2015-03-01: qty 1

## 2015-03-01 MED ORDER — DARBEPOETIN ALFA-POLYSORBATE 300 MCG/0.6ML IJ SOLN
500.0000 ug | INTRAMUSCULAR | Status: DC
  Administered 2015-03-01: 500 ug via SUBCUTANEOUS

## 2015-03-01 NOTE — Progress Notes (Signed)
Hematology and Oncology Follow Up Visit  Alexander Rice [id number] [birth year] 82 y.o. 03/01/2015   Principle Diagnosis:   Anemia of renal insufficiency  Refractory anemia-low-grade  Pernicious anemia  Current Therapy:   Aranesp 300 mcg subcutaneous as needed for hemoglobin less than 10     Interim History:  Mr.  Rice is back for followup.he had a good time down in [place]. He went to go see the [employer]. He goes every year. He's been gone for the past 25 years. He had a great time. He had no problems down the outside of his back. His back is his biggest clinical problem.  He does not seem to be responding as well to the Aranesp. We have increased the Aranesp to 500 g.  His last iron studies done back in March showed a ferritin of 946. His iron was 197.  He's had no fever. He's had no obvious bleeding. He's had no leg swelling. Again, his big problem is the back issues.  Overall, his performance status is ECOG 2-3  Medications:  Current outpatient prescriptions:  .  aspirin 81 MG tablet, Take 81 mg by mouth daily.  , Disp: , Rfl:  .  dorzolamide (TRUSOPT) 2 % ophthalmic solution, Place 1 drop into both eyes 2 (two) times daily.  , Disp: , Rfl:  .  doxazosin (CARDURA) 8 MG tablet, Take 1 tablet (8 mg total)  by mouth at bedtime., Disp: 90 tablet, Rfl: 2 .  isosorbide mononitrate (IMDUR) 30 MG 24 hr tablet, Take 1 tablet (30 mg total) by mouth daily., Disp: 90 tablet, Rfl: 0 .  lisinopril-hydrochlorothiazide (PRINZIDE,ZESTORETIC) 10-12.5 MG per tablet, Take 1 tablet by mouth daily., Disp: 90 tablet, Rfl: 3 .  LUMIGAN 0.01 % SOLN, Place 1 drop into both eyes daily., Disp: , Rfl:  .  Multiple Vitamin (MULTIVITAMIN) tablet, Take 1 tablet by mouth daily.  , Disp: , Rfl:  .  nitroGLYCERIN (NITROSTAT) 0.4 MG SL tablet, Place 0.4 mg under the tongue every 5 (five) minutes as needed.  , Disp: , Rfl:  .  omeprazole (PRILOSEC) 20 MG capsule, Take 1 capsule by mouth   daily, Disp: 90 capsule, Rfl: 0 .  oxybutynin (DITROPAN) 5 MG tablet, Take 5 mg by mouth 2 (two) times daily. Patient states he takes once daily., Disp: , Rfl:  .  potassium chloride SA (K-DUR,KLOR-CON) 20 MEQ tablet, Take 1 tablet (20 mEq total) by mouth daily., Disp: 90 tablet, Rfl: 2 .  simvastatin (ZOCOR) 40 MG tablet, Take 1 tablet (40 mg total) by mouth daily at 6 PM., Disp: 90 tablet, Rfl: 1 .  VESICARE 10 MG tablet, Take 10 mg by mouth daily. PT NOT SURE IF HE IS TAKING THIS MEDICATION 03-01-15, Disp: , Rfl:  No current facility-administered medications for this visit.  Facility-Administered Medications Ordered in Other Visits:  .  darbepoetin (ARANESP) injection 300 mcg, 300 mcg, Subcutaneous, Once, [name], MD  Allergies: No Known Allergies  Past Medical History, Surgical history, Social history, and Family History were reviewed and updated.  Review of Systems: As above  Physical Exam:  height is 5\' 5"  (1.651 m) and weight is 165 lb (74.844 kg). His oral temperature is 97.6 F (36.4 C). His blood pressure is 128/48 and his pulse is 74. His respiration is 20.   Thin, elderly white gentleman in no obvious distress. Head and neck exam shows no ocular or oral lesions. He has no adenopathy in the neck.. There's kyphosis  of the spine. His lungs are clear bilaterally.. Cardiac exam regular rate and rhythm. He has a 1/ 6 systolic murmur. Abdomen is soft. He has good bowel sounds. There is no palpable liver or spleen tip. Back exam shows moderate kyphosis. He has some tenderness in the lower thoracic lumbar spine. Extremities shows age-related changes in his joints. He has good strength. His mild edema in his ankles.. Skin exam no rashes, ecchymoses or petechia.. Neurological exam is nonfocal.  Lab Results  Component Value Date   WBC 4.3 03/01/2015   HGB 6.8* 03/01/2015   HCT 20.2* 03/01/2015   MCV 104* 03/01/2015   PLT 443* 03/01/2015     Chemistry      Component Value  Date/Time   NA 142 01/25/2015 1015   NA 140 11/30/2014 0912   K 4.9* 01/25/2015 1015   K 4.1 11/30/2014 0912   CL 107 01/25/2015 1015   CL 108 11/30/2014 0912   CO2 23 01/25/2015 1015   CO2 24 11/30/2014 0912   BUN 48* 01/25/2015 1015   BUN 24* 11/30/2014 0912   CREATININE 1.7* 01/25/2015 1015   CREATININE 1.06 11/30/2014 0912      Component Value Date/Time   CALCIUM 9.0 01/25/2015 1015   CALCIUM 8.8 11/30/2014 0912   ALKPHOS 44 01/25/2015 1015   ALKPHOS 49 11/30/2014 0912   AST 19 01/25/2015 1015   AST 18 11/30/2014 0912   ALT 16 01/25/2015 1015   ALT 14 11/30/2014 0912   BILITOT 0.80 01/25/2015 1015   BILITOT 0.9 11/30/2014 0912        Impression and Plan: Alexander Rice is 81 year old gentleman with low-grade myelodysplasia. He has refractory anemia. He also has pernicious anemia and anemia of renal insufficiency.  I'm just worried about the fact that he is not responding like he used to with the Aranesp.  We have not done a bone marrow on him for quite a while. As such, I think this might be some that we have to think about.  I think that there is some issues with iron overload but I would not put him on any kind of chelating agent.  We will go ahead and plan to get him back in 3 weeks.   [name], MD 4/12/20169:18 AM

## 2015-03-01 NOTE — Telephone Encounter (Signed)
HGB 6.8. Notified Dr Marin Olp. No orders received at this time.

## 2015-03-01 NOTE — Patient Instructions (Signed)
Darbepoetin Alfa injection What is this medicine? DARBEPOETIN ALFA (dar be POE e tin AL fa) helps your body make more red blood cells. It is used to treat anemia caused by chronic kidney failure and chemotherapy. This medicine may be used for other purposes; ask your health care provider or pharmacist if you have questions. COMMON BRAND NAME(S): Aranesp What should I tell my health care provider before I take this medicine? They need to know if you have any of these conditions: -blood clotting disorders or history of blood clots -cancer patient not on chemotherapy -cystic fibrosis -heart disease, such as angina, heart failure, or a history of a heart attack -hemoglobin level of 12 g/dL or greater -high blood pressure -low levels of folate, iron, or vitamin B12 -seizures -an unusual or allergic reaction to darbepoetin, erythropoietin, albumin, hamster proteins, latex, other medicines, foods, dyes, or preservatives -pregnant or trying to get pregnant -breast-feeding How should I use this medicine? This medicine is for injection into a vein or under the skin. It is usually given by a health care professional in a hospital or clinic setting. If you get this medicine at home, you will be taught how to prepare and give this medicine. Do not shake the solution before you withdraw a dose. Use exactly as directed. Take your medicine at regular intervals. Do not take your medicine more often than directed. It is important that you put your used needles and syringes in a special sharps container. Do not put them in a trash can. If you do not have a sharps container, call your pharmacist or healthcare provider to get one. Talk to your pediatrician regarding the use of this medicine in children. While this medicine may be used in children as young as 1 year for selected conditions, precautions do apply. Overdosage: If you think you have taken too much of this medicine contact a poison control center or  emergency room at once. NOTE: This medicine is only for you. Do not share this medicine with others. What if I miss a dose? If you miss a dose, take it as soon as you can. If it is almost time for your next dose, take only that dose. Do not take double or extra doses. What may interact with this medicine? Do not take this medicine with any of the following medications: -epoetin alfa This list may not describe all possible interactions. Give your health care provider a list of all the medicines, herbs, non-prescription drugs, or dietary supplements you use. Also tell them if you smoke, drink alcohol, or use illegal drugs. Some items may interact with your medicine. What should I watch for while using this medicine? Visit your prescriber or health care professional for regular checks on your progress and for the needed blood tests and blood pressure measurements. It is especially important for the doctor to make sure your hemoglobin level is in the desired range, to limit the risk of potential side effects and to give you the best benefit. Keep all appointments for any recommended tests. Check your blood pressure as directed. Ask your doctor what your blood pressure should be and when you should contact him or her. As your body makes more red blood cells, you may need to take iron, folic acid, or vitamin B supplements. Ask your doctor or health care provider which products are right for you. If you have kidney disease continue dietary restrictions, even though this medication can make you feel better. Talk with your doctor or health   care professional about the foods you eat and the vitamins that you take. What side effects may I notice from receiving this medicine? Side effects that you should report to your doctor or health care professional as soon as possible: -allergic reactions like skin rash, itching or hives, swelling of the face, lips, or tongue -breathing problems -changes in vision -chest  pain -confusion, trouble speaking or understanding -feeling faint or lightheaded, falls -high blood pressure -muscle aches or pains -pain, swelling, warmth in the leg -rapid weight gain -severe headaches -sudden numbness or weakness of the face, arm or leg -trouble walking, dizziness, loss of balance or coordination -seizures (convulsions) -swelling of the ankles, feet, hands -unusually weak or tired Side effects that usually do not require medical attention (report to your doctor or health care professional if they continue or are bothersome): -diarrhea -fever, chills (flu-like symptoms) -headaches -nausea, vomiting -redness, stinging, or swelling at site where injected This list may not describe all possible side effects. Call your doctor for medical advice about side effects. You may report side effects to FDA at 1-800-FDA-1088. Where should I keep my medicine? Keep out of the reach of children. Store in a refrigerator between 2 and 8 degrees C (36 and 46 degrees F). Do not freeze. Do not shake. Throw away any unused portion if using a single-dose vial. Throw away any unused medicine after the expiration date. NOTE: This sheet is a summary. It may not cover all possible information. If you have questions about this medicine, talk to your doctor, pharmacist, or health care provider.  2015, [employer]. (2008-10-19 10:23:57)  

## 2015-03-03 ENCOUNTER — Telehealth: Payer: Self-pay | Admitting: Family Medicine

## 2015-03-03 MED ORDER — LISINOPRIL-HYDROCHLOROTHIAZIDE 10-12.5 MG PO TABS: 1.0000 | ORAL_TABLET | Freq: Every day | ORAL | Status: DC

## 2015-03-03 MED ORDER — POTASSIUM CHLORIDE CRYS ER 20 MEQ PO TBCR: 20.0000 meq | EXTENDED_RELEASE_TABLET | Freq: Every day | ORAL | Status: DC

## 2015-03-03 NOTE — Telephone Encounter (Signed)
Pt needs refill on potassium 20 meq #90 and lisinopril-hctz 10-12.5 mg #90 with refills [facility]

## 2015-03-03 NOTE — Telephone Encounter (Signed)
Medication refilled

## 2015-03-10 ENCOUNTER — Telehealth: Payer: Self-pay | Admitting: Family Medicine

## 2015-03-10 MED ORDER — OMEPRAZOLE 20 MG PO CPDR: DELAYED_RELEASE_CAPSULE | ORAL | Status: DC

## 2015-03-10 NOTE — Telephone Encounter (Signed)
Patient need re-fill on omeprazole (PRILOSEC) 20 MG capsule sent to [facility].

## 2015-03-10 NOTE — Telephone Encounter (Signed)
Medication refilled

## 2015-03-11 ENCOUNTER — Ambulatory Visit (INDEPENDENT_AMBULATORY_CARE_PROVIDER_SITE_OTHER): Payer: Medicare Other | Admitting: Neurology

## 2015-03-11 ENCOUNTER — Encounter: Payer: Self-pay | Admitting: Neurology

## 2015-03-11 ENCOUNTER — Telehealth: Payer: Self-pay | Admitting: *Deleted

## 2015-03-11 ENCOUNTER — Telehealth: Payer: Self-pay | Admitting: Family Medicine

## 2015-03-11 VITALS — BP 140/64 | HR 72 | Resp 16 | Wt 165.0 lb

## 2015-03-11 DIAGNOSIS — I10 Essential (primary) hypertension: Secondary | ICD-10-CM

## 2015-03-11 DIAGNOSIS — R413 Other amnesia: Secondary | ICD-10-CM | POA: Diagnosis not present

## 2015-03-11 DIAGNOSIS — D4620 Refractory anemia with excess of blasts, unspecified: Secondary | ICD-10-CM | POA: Insufficient documentation

## 2015-03-11 DIAGNOSIS — I2510 Atherosclerotic heart disease of native coronary artery without angina pectoris: Secondary | ICD-10-CM

## 2015-03-11 DIAGNOSIS — D510 Vitamin B12 deficiency anemia due to intrinsic factor deficiency: Secondary | ICD-10-CM | POA: Diagnosis not present

## 2015-03-11 DIAGNOSIS — D46Z Other myelodysplastic syndromes: Secondary | ICD-10-CM

## 2015-03-11 MED ORDER — DONEPEZIL HCL 10 MG PO TABS: ORAL_TABLET | ORAL | Status: DC

## 2015-03-11 NOTE — Progress Notes (Signed)
NEUROLOGY CONSULTATION NOTE  Alexander Rice MRN: [id number] DOB: [birth year]  Referring provider: Dr. [name] Primary care provider: Dr. [name]  Reason for consult:  Memory loss  Dear Dr Yong Channel:  Thank you for your kind referral of Alexander Rice for consultation of the above symptoms. Although his history is well known to you, please allow me to reiterate it for the purpose of our medical record. The patient was accompanied to the clinic by his wife and daughter who also provide collateral information. Records and images were personally reviewed where available.  HISTORY OF PRESENT ILLNESS: This is an 81 year old right-handed man with a history of hypertension, hyperlipidemia, low-grade myelodysplasia, refractory anemia, pernicious anemia, presenting for evaluation of worsening memory loss. He feels that his memory is pretty good. He states he does a lot of work for the [employer] and denies having any difficulties with his duties there. He does note that he misplaces things and has had word-finding difficulties. He got lost driving 6 months ago but would not elaborate. He denied any missed bills or missed medications. His wife and daughter, on the other hand, report more concerning memory changes over the past couple of years. He would start in the middle of a conversation and she would have to ask what he is talking about. He has noticeable word-finding difficulties, and sometimes does not seem to understand conversations. His wife is concerned about his driving, they got lost 6 months ago and were out of 3 hours. He sometimes drives/drifts to the median, or sometimes comes to a complete stop. He would get upset with her if she comments. He frequently misplaces things, his cell phone, credit card, one time he did not remember where he parked, and they reported the car stolen until he came back the next day and found the car. He repeats himself. He has had some  problems with late bill payments. They deny any personality changes. No family history of memory loss, no significant head injuries. He fell last month after tripping, no loss of consciousness. He drinks wine daily and beer occasionally, reporting that this is less than before.   He has chronic back pain with significant lumbar stenosis. He denies any headaches, dizziness, diplopia, dysarthria, dysphagia, neck pain, focal numbness/tingling/weakness, bowel/bladder dysfunction, anosmia, tremors.   Laboratory Data: Lab Results  Component Value Date   WBC 4.3 03/01/2015   HGB 6.8* 03/01/2015   HCT 20.2* 03/01/2015   MCV 104* 03/01/2015   PLT 443* 03/01/2015     Chemistry      Component Value Date/Time   NA 139 03/01/2015 0829   NA 140 11/30/2014 0912   K 4.5 03/01/2015 0829   K 4.1 11/30/2014 0912   CL 106 03/01/2015 0829   CL 108 11/30/2014 0912   CO2 25 03/01/2015 0829   CO2 24 11/30/2014 0912   BUN 30* 03/01/2015 0829   BUN 24* 11/30/2014 0912   CREATININE 1.4* 03/01/2015 0829   CREATININE 1.06 11/30/2014 0912      Component Value Date/Time   CALCIUM 8.9 03/01/2015 0829   CALCIUM 8.8 11/30/2014 0912   ALKPHOS 35 03/01/2015 0829   ALKPHOS 49 11/30/2014 0912   AST 26 03/01/2015 0829   AST 18 11/30/2014 0912   ALT 18 03/01/2015 0829   ALT 14 11/30/2014 0912   BILITOT 0.60 03/01/2015 0829   BILITOT 0.9 11/30/2014 0912     Lab Results  Component Value Date   TSH 2.63  07/21/2013   Lab Results  Component Value Date   VITAMINB12 469 09/17/2014   PAST MEDICAL HISTORY: Past Medical History  Diagnosis Date  . CAD (coronary artery disease)   . GERD (gastroesophageal reflux disease)   . Hyperlipidemia   . Hypertension   . BPH (benign prostatic hyperplasia)   . CVD (cardiovascular disease)   . Adenomatous colon polyp 02/1996, 02/2011    TA polyp 02/2011  . B12 deficiency   . Anemia in chronic renal disease 11/02/2011  . Esophageal stricture   . Diverticulosis   .  Hemorrhoids   . MDS (myelodysplastic syndrome)   . Glaucoma   . Heart palpitations   . Hiatal hernia   . History of colonic polyps 09/29/2006    Polyps age 26, Dr. Fuller Plan stated no further colonoscopy due to age      PAST SURGICAL HISTORY: Past Surgical History  Procedure Laterality Date  . Carotid endarterectomy    . Ptca      stent  . Quadriceps repair      muscle attachment  . Colonoscopy    . Inguinal hernia repair      right side/ twice  . Tonsillectomy    . Glaucoma surgery Bilateral 11/19/12    pt's report    MEDICATIONS: Current Outpatient Prescriptions on File Prior to Visit  Medication Sig Dispense Refill  . aspirin 81 MG tablet Take 81 mg by mouth daily.      . dorzolamide (TRUSOPT) 2 % ophthalmic solution Place 1 drop into both eyes 2 (two) times daily.      [name] doxazosin (CARDURA) 8 MG tablet Take 1 tablet (8 mg total)  by mouth at bedtime. 90 tablet 2  . isosorbide mononitrate (IMDUR) 30 MG 24 hr tablet Take 1 tablet (30 mg total) by mouth daily. 90 tablet 0  . lisinopril-hydrochlorothiazide (PRINZIDE,ZESTORETIC) 10-12.5 MG per tablet Take 1 tablet by mouth daily. 90 tablet 2  . LUMIGAN 0.01 % SOLN Place 1 drop into both eyes daily.    . Multiple Vitamin (MULTIVITAMIN) tablet Take 1 tablet by mouth daily.      . potassium chloride SA (K-DUR,KLOR-CON) 20 MEQ tablet Take 1 tablet (20 mEq total) by mouth daily. 90 tablet 2  . simvastatin (ZOCOR) 40 MG tablet Take 1 tablet (40 mg total) by mouth daily at 6 PM. 90 tablet 1  . VESICARE 10 MG tablet Take 10 mg by mouth daily. PT NOT SURE IF HE IS TAKING THIS MEDICATION 03-01-15    . nitroGLYCERIN (NITROSTAT) 0.4 MG SL tablet Place 0.4 mg under the tongue every 5 (five) minutes as needed.      [name] oxybutynin (DITROPAN) 5 MG tablet Take 5 mg by mouth 2 (two) times daily. Patient states he takes once daily.     Current Facility-Administered Medications on File Prior to Visit  Medication Dose Route Frequency Provider Last Rate  Last Dose  . darbepoetin (ARANESP) injection 300 mcg  300 mcg Subcutaneous Once [name], MD        ALLERGIES: No Known Allergies  FAMILY HISTORY: Family History  Problem Relation Age of Onset  . COPD Mother   . Alcohol abuse Father     SOCIAL HISTORY: History   Social History  . Marital Status: Married    Spouse Name: N/A  . Number of Children: 3  . Years of Education: N/A   Occupational History  . Retired    Social History Main Topics  . Smoking status: Never  Smoker   . Smokeless tobacco: Never Used     Comment: never used tobacco  . Alcohol Use: 4.2 oz/week    7 Standard drinks or equivalent per week     Comment: Wine/Beer  . Drug Use: No  . Sexual Activity: Not on file   Other Topics Concern  . Not on file   Social History Narrative   Married (wife patient of Dr. Yong Channel as well)      Retired from [employer] logistics/support/administrative officer and [place]      Hobbies: Scientist, research (physical sciences) [employer], [employer] .    REVIEW OF SYSTEMS: Constitutional: No fevers, chills, or sweats, no generalized fatigue, change in appetite Eyes: No visual changes, double vision, eye pain Ear, nose and throat: No hearing loss, ear pain, nasal congestion, sore throat Cardiovascular: No chest pain, palpitations Respiratory:  No shortness of breath at rest or with exertion, wheezes GastrointestinaI: No nausea, vomiting, diarrhea, abdominal pain, fecal incontinence Genitourinary:  No dysuria, urinary retention or frequency Musculoskeletal:  No neck pain, +back pain Integumentary: No rash, pruritus, skin lesions Neurological: as above Psychiatric: No depression, insomnia, anxiety Endocrine: No palpitations, fatigue, diaphoresis, mood swings, change in appetite, change in weight, increased thirst Hematologic/Lymphatic:  No anemia, purpura, petechiae. Allergic/Immunologic: no itchy/runny eyes, nasal congestion, recent allergic reactions, rashes  PHYSICAL EXAM: Filed Vitals:   03/11/15 0925    BP: 140/64  Pulse: 72  Resp: 16   General: No acute distress Head:  Normocephalic/atraumatic Eyes: Fundoscopic exam shows bilateral sharp discs, no vessel changes, exudates, or hemorrhages Neck: supple, no paraspinal tenderness, full range of motion Back: No paraspinal tenderness Heart: regular rate and rhythm Lungs: Clear to auscultation bilaterally. Vascular: No carotid bruits. Skin/Extremities: No rash, no edema Neurological Exam: Mental status: alert and oriented to person, place, and time, no dysarthria or aphasia, Fund of knowledge is appropriate.  Remote memory intact.  Attention and concentration are reduced.    Able to name objects and repeat phrases. Difficulty with clock drawing 0/5 (see attached) MMSE - Mini Mental State Exam 03/11/2015  Orientation to time 5  Orientation to Place 5  Registration 3  Attention/ Calculation 0  Recall 1  Language- name 2 objects 2  Language- repeat 0  Language- follow 3 step command 2  Language- read & follow direction 1  Write a sentence 1  Copy design 1  Total score 21   Cranial nerves: CN I: not tested CN II: pupils equal, round and reactive to light, visual fields intact, fundi unremarkable. CN III, IV, VI:  full range of motion, no nystagmus, no ptosis CN V: facial sensation intact CN VII: upper and lower face symmetric CN VIII: hearing intact to finger rub CN IX, X: gag intact, uvula midline CN XI: sternocleidomastoid and trapezius muscles intact CN XII: tongue midline Bulk & Tone: normal, no cogwheeling, no fasciculations. Motor: 5/5 throughout with no pronator drift. Sensation: intact to light touch, cold, pin, vibration and joint position sense.  No extinction to double simultaneous stimulation.  Romberg test negative Deep Tendon Reflexes: brisk +2 throughout, 2-3 beats of unsustained clonus bilaterally, negative Hoffman sign Plantar responses: downgoing bilaterally Cerebellar: no incoordination on finger to nose, heel  to shin. No dysdiadochokinesia Gait: stooped posture, wide-based and unsteady  Tremor: none  IMPRESSION: This is an 81 year old righ-handed man with a history of hypertension, hyperlipidemia, myelodysplasia, anemia, lumbar stenosis, presenting for worsening memory loss. MMSE today is 21/30 indicating mild dementia, likely Alzheimer's type. He is noted to  have difficulty with visuospatial tasks (clock drawing) and repetition. MRI brain without contrast will be ordered to assess for underlying structural abnormality and vascular load. We discussed other causes of memory changes, including B12 and thyroid dysfunction, recent B12 normal, check TSH. He may benefit from starting Aricept, side effects and expectations from the medication were discussed. He will start 5mg  daily for 1 week, then increase to 10mg  daily. We also discussed the importance of physical exercise and brain stimulation exercises for brain health.  I discussed concern about driving, I would not recommend he continue driving, however he became understandably upset, and a DMV driver's assessment was recommended. He will follow-up in 3 months.   Thank you for allowing me to participate in the care of this patient. Please do not hesitate to call for any questions or concerns.   Ellouise Newer, M.D.  CC: Dr. Yong Channel

## 2015-03-11 NOTE — Patient Instructions (Signed)
1. Schedule MRI brain without contrast 2. Bloodwork for TSH 3. Start Aricept 10mg : Take 1/2 tablet daily for 1 week, then increase to 1 tablet daily 4. Recommend no driving until Driver's assessment done at the [facility] 5. Physical exercise and brain stimulation exercises are important for brain health 6. Follow-up in 3 months

## 2015-03-11 NOTE — Addendum Note (Signed)
Addended by: Thurmon Fair on: 03/11/2015 02:37 PM   Modules accepted: Orders, Medications

## 2015-03-11 NOTE — Telephone Encounter (Signed)
Error

## 2015-03-11 NOTE — Telephone Encounter (Signed)
Rayann from [place] called to make [place] aware that patient's ins isn't covering TSH for his dx of memory loss. Per Dr. Delice Lesch we cancel test due to ins not covering.

## 2015-03-14 DIAGNOSIS — Z0279 Encounter for issue of other medical certificate: Secondary | ICD-10-CM

## 2015-03-16 ENCOUNTER — Other Ambulatory Visit: Payer: Self-pay | Admitting: Family Medicine

## 2015-03-18 ENCOUNTER — Ambulatory Visit (HOSPITAL_COMMUNITY)
Admission: RE | Admit: 2015-03-18 | Discharge: 2015-03-18 | Disposition: A | Discharge status: Home / Self Care | Payer: Medicare Other | Source: Ambulatory Visit | Attending: Neurology | Admitting: Neurology

## 2015-03-18 DIAGNOSIS — R413 Other amnesia: Secondary | ICD-10-CM | POA: Insufficient documentation

## 2015-03-18 DIAGNOSIS — H9190 Unspecified hearing loss, unspecified ear: Secondary | ICD-10-CM | POA: Diagnosis not present

## 2015-03-18 DIAGNOSIS — R9082 White matter disease, unspecified: Secondary | ICD-10-CM | POA: Diagnosis not present

## 2015-03-22 ENCOUNTER — Other Ambulatory Visit: Payer: Self-pay | Admitting: *Deleted

## 2015-03-22 ENCOUNTER — Other Ambulatory Visit (HOSPITAL_BASED_OUTPATIENT_CLINIC_OR_DEPARTMENT_OTHER): Payer: Medicare Other

## 2015-03-22 ENCOUNTER — Observation Stay (HOSPITAL_COMMUNITY)
Admission: EM | Admit: 2015-03-22 | Discharge: 2015-03-23 | Disposition: A | Discharge status: Home / Self Care | Payer: Medicare Other | Attending: Internal Medicine | Admitting: Internal Medicine

## 2015-03-22 ENCOUNTER — Encounter (HOSPITAL_COMMUNITY): Payer: Self-pay | Admitting: Emergency Medicine

## 2015-03-22 ENCOUNTER — Ambulatory Visit (HOSPITAL_BASED_OUTPATIENT_CLINIC_OR_DEPARTMENT_OTHER): Payer: Medicare Other | Admitting: Hematology & Oncology

## 2015-03-22 ENCOUNTER — Encounter: Payer: Self-pay | Admitting: Hematology & Oncology

## 2015-03-22 ENCOUNTER — Telehealth: Payer: Self-pay | Admitting: *Deleted

## 2015-03-22 ENCOUNTER — Ambulatory Visit (HOSPITAL_BASED_OUTPATIENT_CLINIC_OR_DEPARTMENT_OTHER): Payer: Medicare Other

## 2015-03-22 VITALS — BP 141/50 | HR 65 | Temp 97.7°F | Resp 18 | Ht 67.0 in | Wt 155.0 lb

## 2015-03-22 VITALS — BP 164/69 | HR 65 | Resp 18

## 2015-03-22 DIAGNOSIS — K210 Gastro-esophageal reflux disease with esophagitis: Secondary | ICD-10-CM

## 2015-03-22 DIAGNOSIS — I10 Essential (primary) hypertension: Secondary | ICD-10-CM

## 2015-03-22 DIAGNOSIS — E785 Hyperlipidemia, unspecified: Secondary | ICD-10-CM | POA: Insufficient documentation

## 2015-03-22 DIAGNOSIS — I2510 Atherosclerotic heart disease of native coronary artery without angina pectoris: Secondary | ICD-10-CM | POA: Diagnosis present

## 2015-03-22 DIAGNOSIS — I129 Hypertensive chronic kidney disease with stage 1 through stage 4 chronic kidney disease, or unspecified chronic kidney disease: Secondary | ICD-10-CM | POA: Insufficient documentation

## 2015-03-22 DIAGNOSIS — D518 Other vitamin B12 deficiency anemias: Secondary | ICD-10-CM

## 2015-03-22 DIAGNOSIS — K219 Gastro-esophageal reflux disease without esophagitis: Secondary | ICD-10-CM | POA: Diagnosis not present

## 2015-03-22 DIAGNOSIS — D469 Myelodysplastic syndrome, unspecified: Secondary | ICD-10-CM | POA: Diagnosis not present

## 2015-03-22 DIAGNOSIS — Z7982 Long term (current) use of aspirin: Secondary | ICD-10-CM | POA: Insufficient documentation

## 2015-03-22 DIAGNOSIS — D649 Anemia, unspecified: Secondary | ICD-10-CM

## 2015-03-22 DIAGNOSIS — E611 Iron deficiency: Secondary | ICD-10-CM | POA: Diagnosis not present

## 2015-03-22 DIAGNOSIS — Z86018 Personal history of other benign neoplasm: Secondary | ICD-10-CM | POA: Diagnosis not present

## 2015-03-22 DIAGNOSIS — D510 Vitamin B12 deficiency anemia due to intrinsic factor deficiency: Secondary | ICD-10-CM

## 2015-03-22 DIAGNOSIS — E871 Hypo-osmolality and hyponatremia: Secondary | ICD-10-CM | POA: Diagnosis present

## 2015-03-22 DIAGNOSIS — E538 Deficiency of other specified B group vitamins: Secondary | ICD-10-CM | POA: Insufficient documentation

## 2015-03-22 DIAGNOSIS — D631 Anemia in chronic kidney disease: Secondary | ICD-10-CM | POA: Insufficient documentation

## 2015-03-22 DIAGNOSIS — D464 Refractory anemia, unspecified: Secondary | ICD-10-CM

## 2015-03-22 DIAGNOSIS — H409 Unspecified glaucoma: Secondary | ICD-10-CM | POA: Diagnosis not present

## 2015-03-22 DIAGNOSIS — R112 Nausea with vomiting, unspecified: Secondary | ICD-10-CM | POA: Diagnosis not present

## 2015-03-22 DIAGNOSIS — R413 Other amnesia: Secondary | ICD-10-CM | POA: Diagnosis present

## 2015-03-22 DIAGNOSIS — D513 Other dietary vitamin B12 deficiency anemia: Secondary | ICD-10-CM | POA: Diagnosis not present

## 2015-03-22 DIAGNOSIS — N289 Disorder of kidney and ureter, unspecified: Secondary | ICD-10-CM

## 2015-03-22 DIAGNOSIS — R110 Nausea: Secondary | ICD-10-CM | POA: Diagnosis not present

## 2015-03-22 DIAGNOSIS — N189 Chronic kidney disease, unspecified: Secondary | ICD-10-CM | POA: Diagnosis not present

## 2015-03-22 DIAGNOSIS — R911 Solitary pulmonary nodule: Secondary | ICD-10-CM | POA: Diagnosis present

## 2015-03-22 DIAGNOSIS — N400 Enlarged prostate without lower urinary tract symptoms: Secondary | ICD-10-CM | POA: Diagnosis not present

## 2015-03-22 DIAGNOSIS — Z79899 Other long term (current) drug therapy: Secondary | ICD-10-CM | POA: Insufficient documentation

## 2015-03-22 DIAGNOSIS — Z86010 Personal history of colonic polyps: Secondary | ICD-10-CM | POA: Insufficient documentation

## 2015-03-22 DIAGNOSIS — D4620 Refractory anemia with excess of blasts, unspecified: Secondary | ICD-10-CM

## 2015-03-22 LAB — RETICULOCYTES (CHCC)
ABS Retic: 13.7 10*3/uL — ABNORMAL LOW (ref 19.0–186.0)
RBC.: 2.28 MIL/uL — ABNORMAL LOW (ref 4.22–5.81)
Retic Ct Pct: 0.6 % (ref 0.4–2.3)

## 2015-03-22 LAB — CBC WITH DIFFERENTIAL (CANCER CENTER ONLY)
BASO#: 0 10*3/uL (ref 0.0–0.2)
BASO%: 0.3 % (ref 0.0–2.0)
EOS%: 2.6 % (ref 0.0–7.0)
Eosinophils Absolute: 0.1 10*3/uL (ref 0.0–0.5)
HCT: 22.9 % — ABNORMAL LOW (ref 38.7–49.9)
HEMOGLOBIN: 8 g/dL — AB (ref 13.0–17.1)
LYMPH#: 0.9 10*3/uL (ref 0.9–3.3)
LYMPH%: 27.7 % (ref 14.0–48.0)
MCH: 35.6 pg — AB (ref 28.0–33.4)
MCHC: 34.9 g/dL (ref 32.0–35.9)
MCV: 102 fL — ABNORMAL HIGH (ref 82–98)
MONO#: 0.3 10*3/uL (ref 0.1–0.9)
MONO%: 10.1 % (ref 0.0–13.0)
NEUT%: 59.3 % (ref 40.0–80.0)
NEUTROS ABS: 1.8 10*3/uL (ref 1.5–6.5)
PLATELETS: 439 10*3/uL — AB (ref 145–400)
RBC: 2.25 10*6/uL — ABNORMAL LOW (ref 4.20–5.70)
RDW: 17.3 % — ABNORMAL HIGH (ref 11.1–15.7)
WBC: 3.1 10*3/uL — AB (ref 4.0–10.0)

## 2015-03-22 LAB — COMPREHENSIVE METABOLIC PANEL
ALK PHOS: 45 U/L (ref 38–126)
ALT: 18 U/L (ref 17–63)
AST: 21 U/L (ref 15–41)
Albumin: 4.3 g/dL (ref 3.5–5.0)
Anion gap: 9 (ref 5–15)
BILIRUBIN TOTAL: 1.4 mg/dL — AB (ref 0.3–1.2)
BUN: 21 mg/dL — ABNORMAL HIGH (ref 6–20)
CO2: 19 mmol/L — AB (ref 22–32)
Calcium: 8.9 mg/dL (ref 8.9–10.3)
Chloride: 95 mmol/L — ABNORMAL LOW (ref 101–111)
Creatinine, Ser: 1.01 mg/dL (ref 0.61–1.24)
GFR calc Af Amer: >60 mL/min (ref >60)
Glucose, Bld: 148 mg/dL — ABNORMAL HIGH (ref 70–99)
POTASSIUM: 4.3 mmol/L (ref 3.5–5.1)
SODIUM: 123 mmol/L — AB (ref 135–145)
Total Protein: 6 g/dL — ABNORMAL LOW (ref 6.5–8.1)

## 2015-03-22 LAB — CMP (CANCER CENTER ONLY)
ALT: 18 U/L (ref 10–47)
AST: 23 U/L (ref 11–38)
Albumin: 3.6 g/dL (ref 3.3–5.5)
Alkaline Phosphatase: 48 U/L (ref 26–84)
BILIRUBIN TOTAL: 1.2 mg/dL (ref 0.20–1.60)
BUN, Bld: 20 mg/dL (ref 7–22)
CHLORIDE: 96 meq/L — AB (ref 98–108)
CO2: 25 mEq/L (ref 18–33)
Calcium: 8.9 mg/dL (ref 8.0–10.3)
Creat: 1.3 mg/dl — ABNORMAL HIGH (ref 0.6–1.2)
GLUCOSE: 101 mg/dL (ref 73–118)
Potassium: 4.6 mEq/L (ref 3.3–4.7)
SODIUM: 130 meq/L (ref 128–145)
TOTAL PROTEIN: 6 g/dL — AB (ref 6.4–8.1)

## 2015-03-22 LAB — CBC WITH DIFFERENTIAL/PLATELET
Basophils Absolute: 0 10*3/uL (ref 0.0–0.1)
Basophils Relative: 0 % (ref 0–1)
EOS PCT: 0 % (ref 0–5)
Eosinophils Absolute: 0 10*3/uL (ref 0.0–0.7)
HCT: 22.7 % — ABNORMAL LOW (ref 39.0–52.0)
HEMOGLOBIN: 8.1 g/dL — AB (ref 13.0–17.0)
LYMPHS ABS: 0.3 10*3/uL — AB (ref 0.7–4.0)
LYMPHS PCT: 6 % — AB (ref 12–46)
MCH: 35.4 pg — ABNORMAL HIGH (ref 26.0–34.0)
MCHC: 35.7 g/dL (ref 30.0–36.0)
MCV: 99.1 fL (ref 78.0–100.0)
MONO ABS: 0 10*3/uL — AB (ref 0.1–1.0)
Monocytes Relative: 0 % — ABNORMAL LOW (ref 3–12)
NEUTROS ABS: 4.4 10*3/uL (ref 1.7–7.7)
NEUTROS PCT: 94 % — AB (ref 43–77)
PLATELETS: 420 10*3/uL — AB (ref 150–400)
RBC: 2.29 MIL/uL — ABNORMAL LOW (ref 4.22–5.81)
RDW: 17.7 % — ABNORMAL HIGH (ref 11.5–15.5)
WBC: 4.7 10*3/uL (ref 4.0–10.5)

## 2015-03-22 LAB — IRON AND TIBC CHCC
%SAT: 71 % — ABNORMAL HIGH (ref 20–55)
Iron: 153 ug/dL (ref 42–163)
TIBC: 214 ug/dL (ref 202–409)
UIBC: 61 ug/dL — AB (ref 117–376)

## 2015-03-22 LAB — FERRITIN CHCC: Ferritin: 1240 ng/ml — ABNORMAL HIGH (ref 22–316)

## 2015-03-22 LAB — LIPASE, BLOOD: Lipase: 33 U/L (ref 22–51)

## 2015-03-22 LAB — CHCC SATELLITE - SMEAR

## 2015-03-22 MED ORDER — CVS OMEGA-3 KRILL OIL 300 MG PO CAPS: 350.0000 mg | ORAL_CAPSULE | Freq: Every day | ORAL | Status: DC

## 2015-03-22 MED ORDER — SODIUM CHLORIDE 0.9 % IV BOLUS (SEPSIS)
1000.0000 mL | Freq: Once | INTRAVENOUS | Status: AC
  Administered 2015-03-22: 1000 mL via INTRAVENOUS

## 2015-03-22 MED ORDER — ADULT MULTIVITAMIN W/MINERALS CH
1.0000 | ORAL_TABLET | Freq: Every day | ORAL | Status: DC
  Administered 2015-03-22 – 2015-03-23 (×2): 1 via ORAL
  Filled 2015-03-22 (×2): qty 1

## 2015-03-22 MED ORDER — ALUM & MAG HYDROXIDE-SIMETH 200-200-20 MG/5ML PO SUSP: 30.0000 mL | Freq: Four times a day (QID) | ORAL | Status: DC | PRN

## 2015-03-22 MED ORDER — SODIUM CHLORIDE 0.9 % IV SOLN
INTRAVENOUS | Status: AC
  Administered 2015-03-22: via INTRAVENOUS

## 2015-03-22 MED ORDER — CVS PROBIOTIC PO CHEW: 1.0000 | CHEWABLE_TABLET | Freq: Every day | ORAL | Status: DC

## 2015-03-22 MED ORDER — LISINOPRIL 10 MG PO TABS
10.0000 mg | ORAL_TABLET | Freq: Every day | ORAL | Status: DC
  Administered 2015-03-22 – 2015-03-23 (×2): 10 mg via ORAL
  Filled 2015-03-22 (×2): qty 1

## 2015-03-22 MED ORDER — ASPIRIN 81 MG PO CHEW
81.0000 mg | CHEWABLE_TABLET | Freq: Every day | ORAL | Status: DC
  Administered 2015-03-22 – 2015-03-23 (×2): 81 mg via ORAL
  Filled 2015-03-22 (×2): qty 1

## 2015-03-22 MED ORDER — SODIUM CHLORIDE 0.9 % IV SOLN
510.0000 mg | Freq: Once | INTRAVENOUS | Status: AC
  Administered 2015-03-22: 510 mg via INTRAVENOUS
  Filled 2015-03-22: qty 17

## 2015-03-22 MED ORDER — DOXAZOSIN MESYLATE 8 MG PO TABS
8.0000 mg | ORAL_TABLET | Freq: Every day | ORAL | Status: DC
  Administered 2015-03-22 – 2015-03-23 (×2): 8 mg via ORAL
  Filled 2015-03-22 (×2): qty 1

## 2015-03-22 MED ORDER — DARBEPOETIN ALFA 500 MCG/ML IJ SOSY
PREFILLED_SYRINGE | INTRAMUSCULAR | Status: AC
  Filled 2015-03-22: qty 1

## 2015-03-22 MED ORDER — ONDANSETRON HCL 4 MG/2ML IJ SOLN
4.0000 mg | Freq: Once | INTRAMUSCULAR | Status: AC
  Administered 2015-03-22: 4 mg via INTRAVENOUS
  Filled 2015-03-22: qty 2

## 2015-03-22 MED ORDER — DORZOLAMIDE HCL 2 % OP SOLN
1.0000 [drp] | Freq: Two times a day (BID) | OPHTHALMIC | Status: DC
  Administered 2015-03-22 – 2015-03-23 (×2): 1 [drp] via OPHTHALMIC
  Filled 2015-03-22: qty 10

## 2015-03-22 MED ORDER — NITROGLYCERIN 0.4 MG SL SUBL: 0.4000 mg | SUBLINGUAL_TABLET | SUBLINGUAL | Status: DC | PRN

## 2015-03-22 MED ORDER — ONDANSETRON HCL 4 MG/2ML IJ SOLN: 4.0000 mg | Freq: Three times a day (TID) | INTRAMUSCULAR | Status: DC | PRN

## 2015-03-22 MED ORDER — HEPARIN SODIUM (PORCINE) 5000 UNIT/ML IJ SOLN
5000.0000 [IU] | Freq: Three times a day (TID) | INTRAMUSCULAR | Status: DC
  Administered 2015-03-22 – 2015-03-23 (×2): 5000 [IU] via SUBCUTANEOUS
  Filled 2015-03-22 (×5): qty 1

## 2015-03-22 MED ORDER — DARBEPOETIN ALFA 500 MCG/ML IJ SOSY
500.0000 ug | PREFILLED_SYRINGE | Freq: Once | INTRAMUSCULAR | Status: AC
  Administered 2015-03-22: 500 ug via SUBCUTANEOUS

## 2015-03-22 MED ORDER — OMEGA-3-ACID ETHYL ESTERS 1 G PO CAPS
1.0000 g | ORAL_CAPSULE | Freq: Every day | ORAL | Status: DC
  Administered 2015-03-22 – 2015-03-23 (×2): 1 g via ORAL
  Filled 2015-03-22 (×2): qty 1

## 2015-03-22 MED ORDER — DONEPEZIL HCL 10 MG PO TABS
10.0000 mg | ORAL_TABLET | Freq: Every day | ORAL | Status: DC
  Administered 2015-03-22: 10 mg via ORAL
  Filled 2015-03-22 (×2): qty 1

## 2015-03-22 MED ORDER — LATANOPROST 0.005 % OP SOLN
1.0000 [drp] | Freq: Every day | OPHTHALMIC | Status: DC
  Administered 2015-03-22: 1 [drp] via OPHTHALMIC
  Filled 2015-03-22: qty 2.5

## 2015-03-22 MED ORDER — METHYLPREDNISOLONE SODIUM SUCC 125 MG IJ SOLR
125.0000 mg | Freq: Once | INTRAMUSCULAR | Status: AC
  Administered 2015-03-22: 125 mg via INTRAVENOUS

## 2015-03-22 MED ORDER — SIMVASTATIN 40 MG PO TABS
40.0000 mg | ORAL_TABLET | Freq: Every day | ORAL | Status: DC
  Filled 2015-03-22: qty 1

## 2015-03-22 MED ORDER — DARIFENACIN HYDROBROMIDE ER 7.5 MG PO TB24
15.0000 mg | ORAL_TABLET | Freq: Every day | ORAL | Status: DC
  Administered 2015-03-22 – 2015-03-23 (×2): 15 mg via ORAL
  Filled 2015-03-22 (×2): qty 2

## 2015-03-22 MED ORDER — ISOSORBIDE MONONITRATE ER 30 MG PO TB24
30.0000 mg | ORAL_TABLET | Freq: Every day | ORAL | Status: DC
  Administered 2015-03-22 – 2015-03-23 (×2): 30 mg via ORAL
  Filled 2015-03-22 (×2): qty 1

## 2015-03-22 MED ORDER — METHYLPREDNISOLONE SODIUM SUCC 125 MG IJ SOLR
INTRAMUSCULAR | Status: AC
  Filled 2015-03-22: qty 2

## 2015-03-22 MED ORDER — FAMOTIDINE IN NACL 20-0.9 MG/50ML-% IV SOLN
INTRAVENOUS | Status: AC
  Filled 2015-03-22: qty 100

## 2015-03-22 MED ORDER — RISAQUAD PO CAPS
1.0000 | ORAL_CAPSULE | Freq: Every day | ORAL | Status: DC
  Administered 2015-03-22 – 2015-03-23 (×2): 1 via ORAL
  Filled 2015-03-22 (×2): qty 1

## 2015-03-22 MED ORDER — HYDRALAZINE HCL 20 MG/ML IJ SOLN: 5.0000 mg | INTRAMUSCULAR | Status: DC | PRN

## 2015-03-22 MED ORDER — FAMOTIDINE IN NACL 20-0.9 MG/50ML-% IV SOLN
40.0000 mg | Freq: Once | INTRAVENOUS | Status: AC
  Administered 2015-03-22: 40 mg via INTRAVENOUS

## 2015-03-22 NOTE — Telephone Encounter (Signed)
Patient's daughter called stating that patient was continuing to feel poorly after feraheme reaction in the office. He c/o severe nausea with multiple episode of vomiting. Spoke to Dr Earlie Server, the physician on-call and later Dr Marin Olp who both agreed that patient should go to the emergency room to be evaluated for a continued reaction. Daughter is in agreement with plan and will take the patient there now. ED charge nurse also notified of patients upcoming arrival.

## 2015-03-22 NOTE — Progress Notes (Signed)
After Feraheme, patient stated he wasn't feeling well.  Face appeared pale, stated he was "woozyheaded" VSS. Patient slept a little  But woke up stating he still didn't feel well.  Dr. Marin Olp here to assess patient.  Orders received for IV meds.  PAtient feeling somewhat better.  Daughter here to take him home.  Discharged at 1315.  VSS.  Dr. Niel Hummer reassessed patient and stated it was ok to send him home

## 2015-03-22 NOTE — ED Notes (Addendum)
Patient c/o nausea, emesis, and facial edema after IV infusion of iron today at MDs office. Office administered cortisol shot and Pepcid to relieve symptoms.

## 2015-03-22 NOTE — Progress Notes (Signed)
Hematology and Oncology Follow Up Visit  Alexander Rice [id number] 1932/01/16 81 y.o. 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  Alexander Rice is back for followup.he feels limited better. He comes in with his daughter. I probably not see her for I'm not seen her for many years.  He has not been as responsive to Aranesp. I will get his placement today. It looks like his iron might be on the low side. I will go ahead and give IV iron. The last we checked his iron studies his total iron was less than 100. His ferritin was over thousand but this is an acute phase reactant because of his severe arthritis.  He's had no bleeding. He's had no change in bowel or bladder habits. He's had no leg swelling. He's had no cough. He's had no nausea vomiting.  He still is bothered by his arthritis. 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   Overall, his performance status is ECOG 2-3  Medications:  Current outpatient prescriptions:  .  aspirin 81 MG tablet, Take 81 mg by mouth daily.  , Disp: , Rfl:  .  [employer] OMEGA-3 KRILL OIL PO, Take 350 mg by mouth daily., Disp: , Rfl:  .  donepezil (ARICEPT) 10 MG tablet, Take 1/2 tablet daily for 1 week, then increase to 1 tablet daily, Disp: 30 tablet, Rfl: 4 .  dorzolamide (TRUSOPT) 2 % ophthalmic solution, Place 1 drop into both eyes 2 (two) times daily.  , Disp: , Rfl:  .  doxazosin (CARDURA) 8 MG tablet, Take 1 tablet (8 mg total)  by mouth at bedtime., Disp: 90 tablet, Rfl: 2 .  isosorbide mononitrate (IMDUR) 30 MG 24 hr tablet, Take 1 tablet by mouth  (30mg ) daily, Disp: 90 tablet, Rfl: 2 .  lisinopril-hydrochlorothiazide (PRINZIDE,ZESTORETIC) 10-12.5 MG  per tablet, Take 1 tablet by mouth daily., Disp: 90 tablet, Rfl: 2 .  LUMIGAN 0.01 % SOLN, Place 1 drop into both eyes daily., Disp: , Rfl:  .  Multiple Vitamin (MULTIVITAMIN) tablet, Take 1 tablet by mouth daily.  , Disp: , Rfl:  .  nitroGLYCERIN (NITROSTAT) 0.4 MG SL tablet, Place 0.4 mg under the tongue every 5 (five) minutes as needed.  , Disp: , Rfl:  .  potassium chloride SA (K-DUR,KLOR-CON) 20 MEQ tablet, Take 1 tablet (20 mEq total) by mouth daily., Disp: 90 tablet, Rfl: 2 .  Probiotic Product ([employer] PROBIOTIC) CHEW, Chew by mouth daily., Disp: , Rfl:  .  simvastatin (ZOCOR) 40 MG tablet, TAKE 1 TABLET BY MOUTH  DAILY AT 6 PM, Disp: 90 tablet, Rfl: 2 .  VESICARE 10 MG tablet, Take 10 mg by mouth daily. PT NOT SURE IF HE IS TAKING THIS MEDICATION 03-01-15, Disp: , Rfl:  No current facility-administered medications for this visit.  Facility-Administered Medications Ordered in Other Visits:  .  darbepoetin (ARANESP) injection 300 mcg, 300 mcg, Subcutaneous, Once, [name], MD  Allergies: No Known Allergies  Past Medical History, Surgical history, Social history, and Family History were reviewed and updated.  Review of Systems: As above  Physical Exam:  height is 5\' 7"  (  1.702 m) and weight is 155 lb (70.308 kg). His oral temperature is 97.7 F (36.5 C). His blood pressure is 141/50 and his pulse is 65. His respiration is 18.   Thin, elderly white gentleman in no obvious distress. Head and neck exam shows no ocular or oral lesions. He has no adenopathy in the neck.. There's kyphosis of the spine. His lungs are clear bilaterally.. Cardiac exam regular rate and rhythm. He has a 1/ 6 systolic murmur. Abdomen is soft. He has good bowel sounds. There is no palpable liver or spleen tip. Back exam shows moderate kyphosis. He has some tenderness in the lower thoracic lumbar spine. Extremities shows age-related changes in his joints. He has good strength. His mild edema in his ankles.. Skin  exam no rashes, ecchymoses or petechia.. Neurological exam is nonfocal.  Lab Results  Component Value Date   WBC 3.1* 03/22/2015   HGB 8.0* 03/22/2015   HCT 22.9* 03/22/2015   MCV 102* 03/22/2015   PLT 439* 03/22/2015     Chemistry      Component Value Date/Time   NA 130 03/22/2015 0841   NA 140 11/30/2014 0912   K 4.6 03/22/2015 0841   K 4.1 11/30/2014 0912   CL 96* 03/22/2015 0841   CL 108 11/30/2014 0912   CO2 25 03/22/2015 0841   CO2 24 11/30/2014 0912   BUN 20 03/22/2015 0841   BUN 24* 11/30/2014 0912   CREATININE 1.3* 03/22/2015 0841   CREATININE 1.06 11/30/2014 0912      Component Value Date/Time   CALCIUM 8.9 03/22/2015 0841   CALCIUM 8.8 11/30/2014 0912   ALKPHOS 48 03/22/2015 0841   ALKPHOS 49 11/30/2014 0912   AST 23 03/22/2015 0841   AST 18 11/30/2014 0912   ALT 18 03/22/2015 0841   ALT 14 11/30/2014 0912   BILITOT 1.20 03/22/2015 0841   BILITOT 0.9 11/30/2014 0912        Impression and Plan: [name] is 81 year old gentleman with low-grade myelodysplasia. He has refractory anemia. He also has pernicious anemia and anemia of renal insufficiency.  We will add Feraheme. I will give 1 dose of Feraheme. We'll see how that helps. Hopefully, this will is blood count up better.  If not, that the we will have to do a bone marrow test on him.  Talk to he and his daughter about this. They understand the reasons why we may have to do a bone marrow test. They agree.  I want to see him back in one month.   [name], MD 5/3/201610:12 AM

## 2015-03-22 NOTE — H&P (Signed)
[facility] History and Physical  DUGLAS HEIER PJA:[id number] DOB: 1932/01/08 DOA: 03/22/2015  Referring physician: ED physician PCP: [name], MD  Specialists:   Chief Complaint: Nausea and vomiting  HPI: Alexander Rice is a 81 y.o. male with past medical history of hypertension, hyperlipidemia, GERD, coronary artery disease (post status of stent placement), BPH, esophageal stricture, MDS, who presents with nausea and vomiting.  Patient reports that he was in the cancer center today and had iron infusion for the first time and had Aranesp injection. Patient then developed nausea. He was given Pepcid and cortisol with little improvement. On the way home, patient had worsening nausea and vomited twice. There is no blood in the vomitus. He does not have shortness of breath or rashes. Denies any throat swelling. His symptoms improved significantly after received 1 dose of Zofran in emergency room.  Currently patient denies fever, chills, fatigue, running nose, ear pain, headaches, cough, chest pain, SOB, abdominal pain, diarrhea, dysuria, urgency, frequency, hematuria, skin rashes or leg swelling. No unilateral weakness, numbness or tingling sensations. No vision change or hearing loss.  In ED, patient was found to have sodium of 123, WBC 4.7, normal temperature, slightly bradycardia, renal function okay, total bilirubin 1.4 with normal AST and ALT.  Review of Systems:   Constitutional: No fever, no chills; No weight loss, no weight gain, no fatigue.  HEENT: No blurry vision, no diplopia, no pharyngitis, no dysphagia  CV: No chest pain, no palpitations, no PND, no orthopnea, no edema. Resp: No SOB, no cough, no pleuritic pain. GI: has nausea and vomiting, no diarrhea, no melena, no hematochezia, no constipation, no abdominal pain.  GU: No dysuria, no hematuria, no frequency, no urgency.  MSK: no myalgias, no arthralgias.  Neuro: No headache, no focal neurological  deficits, no history of seizures. Psych: No suicidal or homicidal idiation Endo: No heat intolerance, no cold intolerance, no polyuria, no polydipsia  Skin: No rashes, no skin lesions.  Heme: No easy bruising.  Travel history:No recent long distant travel.   Where does patient live?  At home Can patient participate in ADLs? some  Allergy: No Known Allergies  Past Medical History  Diagnosis Date  . CAD (coronary artery disease)   . GERD (gastroesophageal reflux disease)   . Hyperlipidemia   . Hypertension   . BPH (benign prostatic hyperplasia)   . CVD (cardiovascular disease)   . Adenomatous colon polyp 02/1996, 02/2011    TA polyp 02/2011  . B12 deficiency   . Anemia in chronic renal disease 11/02/2011  . Esophageal stricture   . Diverticulosis   . Hemorrhoids   . MDS (myelodysplastic syndrome)   . Glaucoma   . Heart palpitations   . Hiatal hernia   . History of colonic polyps 09/29/2006    Polyps age 47, Dr. Fuller Plan stated no further colonoscopy due to age      Past Surgical History  Procedure Laterality Date  . Carotid endarterectomy    . Ptca      stent  . Quadriceps repair      muscle attachment  . Colonoscopy    . Inguinal hernia repair      right side/ twice  . Tonsillectomy    . Glaucoma surgery Bilateral 11/19/12    pt's report    Social History:  reports that he has never smoked. He has never used smokeless tobacco. He reports that he drinks about 4.2 oz of alcohol per week. He reports that he does not  use illicit drugs.  Family History:  Family History  Problem Relation Age of Onset  . COPD Mother   . Alcohol abuse Father      Prior to Admission medications   Medication Sig Start Date End Date Taking? Authorizing Provider  aspirin 81 MG tablet Take 81 mg by mouth daily.     Yes Historical Provider, MD  [employer] OMEGA-3 KRILL OIL PO Take 350 mg by mouth daily.   Yes Historical Provider, MD  donepezil (ARICEPT) 10 MG tablet Take 1/2 tablet daily for 1 week,  then increase to 1 tablet daily Patient taking differently: Take 10 mg by mouth.  03/11/15  Yes Cameron Sprang, MD  dorzolamide (TRUSOPT) 2 % ophthalmic solution Place 1 drop into both eyes 2 (two) times daily.     Yes Historical Provider, MD  doxazosin (CARDURA) 8 MG tablet Take 1 tablet (8 mg total)  by mouth at bedtime. 01/17/15  Yes Marin Olp, MD  isosorbide mononitrate (IMDUR) 30 MG 24 hr tablet Take 1 tablet by mouth  (30mg ) daily 03/17/15  Yes Marin Olp, MD  lisinopril-hydrochlorothiazide (PRINZIDE,ZESTORETIC) 10-12.5 MG per tablet Take 1 tablet by mouth daily. 03/03/15  Yes Marin Olp, MD  LUMIGAN 0.01 % SOLN Place 1 drop into both eyes at bedtime.  03/23/11  Yes Historical Provider, MD  Multiple Vitamin (MULTIVITAMIN) tablet Take 1 tablet by mouth daily.     Yes Historical Provider, MD  nitroGLYCERIN (NITROSTAT) 0.4 MG SL tablet Place 0.4 mg under the tongue every 5 (five) minutes as needed.     Yes Historical Provider, MD  potassium chloride SA (K-DUR,KLOR-CON) 20 MEQ tablet Take 1 tablet (20 mEq total) by mouth daily. 03/03/15  Yes Marin Olp, MD  PRESCRIPTION MEDICATION Supportive therapy [place]   Yes Historical Provider, MD  Probiotic Product ([employer] PROBIOTIC) CHEW Chew by mouth daily.   Yes Historical Provider, MD  simvastatin (ZOCOR) 40 MG tablet TAKE 1 TABLET BY MOUTH  DAILY AT 6 PM 03/17/15  Yes Marin Olp, MD  VESICARE 10 MG tablet Take 10 mg by mouth daily.  02/16/15  Yes Historical Provider, MD    Physical Exam: Filed Vitals:   03/22/15 1647 03/22/15 2009  BP: 149/50 130/48  Pulse: 63 61  Resp: 16 18  SpO2: 99% 98%   General: Not in acute distress HEENT:       Eyes: PERRL, EOMI, no scleral icterus       ENT: No discharge from the ears and nose, no pharynx injection, no tonsillar enlargement.        Neck: No JVD, no bruit, no mass felt. Heme: No neck or axillary lymph node enlargement Cardiac: S1/S2, RRR, No murmurs, No gallops or  rubs Pulm: Good air movement bilaterally. Clear to auscultation bilaterally. No rales, wheezing, rhonchi or rubs. Abd: Soft, nondistended, nontender, no rebound pain, no organomegaly, BS present Ext: No edema bilaterally. 2+DP/PT pulse bilaterally Musculoskeletal: No joint deformities, erythema, or stiffness, ROM full Skin: No rashes.  Neuro: Alert and oriented X3, cranial nerves II-XII grossly intact, muscle strength 5/5 in all extremeties, sensation to light touch intact. Brachial reflex 2+ bilaterally. Knee reflex 1+ bilaterally. Negative Babinski's sign. Normal finger to nose test. Psych: Patient is not psychotic, no suicidal or hemocidal ideation.  Labs on Admission:  Basic Metabolic Panel:  Recent Labs Lab 03/22/15 0841 03/22/15 1656  NA 130 123*  K 4.6 4.3  CL 96* 95*  CO2 25 19*  GLUCOSE  101 148*  BUN 20 21*  CREATININE 1.3* 1.01  CALCIUM 8.9 8.9   Liver Function Tests:  Recent Labs Lab 03/22/15 0841 03/22/15 1656  AST 23 21  ALT 18 18  ALKPHOS 48 45  BILITOT 1.20 1.4*  PROT 6.0* 6.0*  ALBUMIN  --  4.3    Recent Labs Lab 03/22/15 1656  LIPASE 33   No results for input(s): AMMONIA in the last 168 hours. CBC:  Recent Labs Lab 03/22/15 0841 03/22/15 1656  WBC 3.1* 4.7  NEUTROABS 1.8 4.4  HGB 8.0* 8.1*  HCT 22.9* 22.7*  MCV 102* 99.1  PLT 439* 420*   Cardiac Enzymes: No results for input(s): CKTOTAL, CKMB, CKMBINDEX, TROPONINI in the last 168 hours.  BNP (last 3 results) No results for input(s): BNP in the last 8760 hours.  ProBNP (last 3 results) No results for input(s): PROBNP in the last 8760 hours.  CBG: No results for input(s): GLUCAP in the last 168 hours.  Radiological Exams on Admission: No results found.  EKG: not done in ED, will get one.   Assessment/Plan Principal Problem:   Nausea & vomiting Active Problems:   Essential hypertension   CAD (coronary artery disease)   GERD   BPH (benign prostatic hyperplasia)   MDS  (myelodysplastic syndrome)   Pulmonary nodule   Memory loss   Pernicious anemia   Hyponatremia  Nausea vomiting: Is most likely due to mild reaction to the IV Feraheme iron infusion. Symptoms improved significantly after received Zofran. The patient has mild nausea, no vomiting emergency room. He has sodium of 123.   -will admit to MedSurg bed for observation -Zofran for nausea sodium -Check orthostatic signs -IV fluid: Normal saline 100 mL per hour  Hyponatremia: Na=123. No mental status change.  Likely secondary to vomiting. Prinzide may have also contributed -Switch Prinzide to lisinopril -IV fluid as above -Repeat CMP in morning  MDS: Hgb 6.8 on 4/121/6-->8.1 today. Patient has been followed up by Dr. Marin Olp. Last dosing was 03/22/15, when he received IV iron and Aranesp.  -follow up with Dr. Marin Olp  Hypertension: -Switch Prinzide to lisinopril since patient needed IV fluid -IV hydralazine when necessary  Hyperlipidemia: LDL was 26 on 07/21/13. -Continue Zocor  BPH: -Continue Vesicare and a Doxzosin  CAD: Post status of stent placement. No chest pain. -Continue aspirin, Statin, Imdure and prn NTG. -EKG   DVT ppx: SQ Heparin    Code Status: Full code Family Communication:   Yes, patient's   daughter    at bed side Disposition Plan: Admit to inpatient   Date of Service 03/22/2015    Ivor Costa [facility] Pager [phone]  If 7PM-7AM, please contact night-coverage [email or web address] Password [facility] 03/22/2015, 8:31 PM

## 2015-03-22 NOTE — ED Provider Notes (Signed)
CSN: [id number]     Arrival date & time 03/22/15  1622 History   First MD Initiated Contact with Patient 03/22/15 1644     Chief Complaint  Patient presents with  . Emesis  . Nausea     (Consider location/radiation/quality/duration/timing/severity/associated sxs/prior Treatment) The history is provided by the patient.  DASEAN BROW is a 81 y.o. male hx of CAD, HL, HTN, myelodysplastic syndrome here presenting with nausea, vomiting. Was in the cancer center today and had iron infusion for the first time and had aranesp. Patient then had some nausea and was given Pepcid, cortisol. The way home, patient had worsening vomiting. Denies trouble breathing or rash. Denies any throat swelling but felt that his face was swollen.    Past Medical History  Diagnosis Date  . CAD (coronary artery disease)   . GERD (gastroesophageal reflux disease)   . Hyperlipidemia   . Hypertension   . BPH (benign prostatic hyperplasia)   . CVD (cardiovascular disease)   . Adenomatous colon polyp 02/1996, 02/2011    TA polyp 02/2011  . B12 deficiency   . Anemia in chronic renal disease 11/02/2011  . Esophageal stricture   . Diverticulosis   . Hemorrhoids   . MDS (myelodysplastic syndrome)   . Glaucoma   . Heart palpitations   . Hiatal hernia   . History of colonic polyps 09/29/2006    Polyps age 56, Dr. Fuller Plan stated no further colonoscopy due to age     Past Surgical History  Procedure Laterality Date  . Carotid endarterectomy    . Ptca      stent  . Quadriceps repair      muscle attachment  . Colonoscopy    . Inguinal hernia repair      right side/ twice  . Tonsillectomy    . Glaucoma surgery Bilateral 11/19/12    pt's report   Family History  Problem Relation Age of Onset  . COPD Mother   . Alcohol abuse Father    History  Substance Use Topics  . Smoking status: Never Smoker   . Smokeless tobacco: Never Used     Comment: never used tobacco  . Alcohol Use: 4.2 oz/week    7 Standard  drinks or equivalent per week     Comment: Wine/Beer    Review of Systems  Gastrointestinal: Positive for vomiting.  All other systems reviewed and are negative.     Allergies  Review of patient's allergies indicates no known allergies.  Home Medications   Prior to Admission medications   Medication Sig Start Date End Date Taking? Authorizing Provider  aspirin 81 MG tablet Take 81 mg by mouth daily.     Yes Historical Provider, MD  [employer] OMEGA-3 KRILL OIL PO Take 350 mg by mouth daily.   Yes Historical Provider, MD  donepezil (ARICEPT) 10 MG tablet Take 1/2 tablet daily for 1 week, then increase to 1 tablet daily Patient taking differently: Take 10 mg by mouth.  03/11/15  Yes Cameron Sprang, MD  dorzolamide (TRUSOPT) 2 % ophthalmic solution Place 1 drop into both eyes 2 (two) times daily.     Yes Historical Provider, MD  doxazosin (CARDURA) 8 MG tablet Take 1 tablet (8 mg total)  by mouth at bedtime. 01/17/15  Yes Marin Olp, MD  isosorbide mononitrate (IMDUR) 30 MG 24 hr tablet Take 1 tablet by mouth  (30mg ) daily 03/17/15  Yes Marin Olp, MD  lisinopril-hydrochlorothiazide (PRINZIDE,ZESTORETIC) 10-12.5 MG per tablet Take  1 tablet by mouth daily. 03/03/15  Yes Marin Olp, MD  LUMIGAN 0.01 % SOLN Place 1 drop into both eyes at bedtime.  03/23/11  Yes Historical Provider, MD  Multiple Vitamin (MULTIVITAMIN) tablet Take 1 tablet by mouth daily.     Yes Historical Provider, MD  nitroGLYCERIN (NITROSTAT) 0.4 MG SL tablet Place 0.4 mg under the tongue every 5 (five) minutes as needed.     Yes Historical Provider, MD  potassium chloride SA (K-DUR,KLOR-CON) 20 MEQ tablet Take 1 tablet (20 mEq total) by mouth daily. 03/03/15  Yes Marin Olp, MD  PRESCRIPTION MEDICATION Supportive therapy [place]   Yes Historical Provider, MD  Probiotic Product ([employer] PROBIOTIC) CHEW Chew by mouth daily.   Yes Historical Provider, MD  simvastatin (ZOCOR) 40 MG tablet TAKE 1 TABLET BY MOUTH   DAILY AT 6 PM 03/17/15  Yes Marin Olp, MD  VESICARE 10 MG tablet Take 10 mg by mouth daily.  02/16/15  Yes Historical Provider, MD   BP 149/50 mmHg  Pulse 63  Resp 16  SpO2 99% Physical Exam  Constitutional: He is oriented to person, place, and time. He appears well-developed and well-nourished.  HENT:  Head: Normocephalic.  Mouth/Throat: Oropharynx is clear and moist.  No obvious lip or facial swelling. OP clear   Eyes: Conjunctivae are normal. Pupils are equal, round, and reactive to light.  Neck: Normal range of motion. Neck supple.  Cardiovascular: Normal rate, regular rhythm and normal heart sounds.   Pulmonary/Chest: Effort normal and breath sounds normal. No respiratory distress. He has no wheezes. He has no rales.  Abdominal: Soft. Bowel sounds are normal. He exhibits no distension. There is no tenderness. There is no rebound.  Musculoskeletal: Normal range of motion. He exhibits no edema or tenderness.  Neurological: He is alert and oriented to person, place, and time. No cranial nerve deficit. Coordination normal.  Skin: Skin is warm.  Psychiatric: He has a normal mood and affect. His behavior is normal. Judgment and thought content normal.  Nursing note and vitals reviewed.   ED Course  Procedures (including critical care time)  CRITICAL CARE Performed by: Darl Householder, Burley Kopka   Total critical care time: 30 min   Critical care time was exclusive of separately billable procedures and treating other patients.  Critical care was necessary to treat or prevent imminent or life-threatening deterioration.  Critical care was time spent personally by me on the following activities: development of treatment plan with patient and/or surrogate as well as nursing, discussions with consultants, evaluation of patient's response to treatment, examination of patient, obtaining history from patient or surrogate, ordering and performing treatments and interventions, ordering and review of  laboratory studies, ordering and review of radiographic studies, pulse oximetry and re-evaluation of patient's condition.   Labs Review Labs Reviewed  CBC WITH DIFFERENTIAL/PLATELET - Abnormal; Notable for the following:    RBC 2.29 (*)    Hemoglobin 8.1 (*)    HCT 22.7 (*)    MCH 35.4 (*)    RDW 17.7 (*)    Platelets 420 (*)    Neutrophils Relative % 94 (*)    Lymphocytes Relative 6 (*)    Lymphs Abs 0.3 (*)    Monocytes Relative 0 (*)    Monocytes Absolute 0.0 (*)    All other components within normal limits  COMPREHENSIVE METABOLIC PANEL - Abnormal; Notable for the following:    Sodium 123 (*)    Chloride 95 (*)    CO2  19 (*)    Glucose, Bld 148 (*)    BUN 21 (*)    Total Protein 6.0 (*)    Total Bilirubin 1.4 (*)    All other components within normal limits  LIPASE, BLOOD    Imaging Review No results found.   EKG Interpretation None      MDM   Final diagnoses:  None    STEFFON GLADU is a 81 y.o. male here with vomiting s/p iron infusion. Likely side effect of medication. No signs of anaphylaxis or severe allergic reaction. Given steroids by oncology already. Will recheck labs, hydrate and give zofran.   7:35 PM Na dropped to 123. Likely dehydrated. Given 1 L NS and zofran. Will admit for observation.    Wandra Arthurs, MD 03/22/15 [phone]

## 2015-03-22 NOTE — Patient Instructions (Signed)

## 2015-03-23 ENCOUNTER — Encounter (HOSPITAL_COMMUNITY): Payer: Self-pay | Admitting: *Deleted

## 2015-03-23 ENCOUNTER — Telehealth: Payer: Self-pay | Admitting: Family Medicine

## 2015-03-23 DIAGNOSIS — D519 Vitamin B12 deficiency anemia, unspecified: Secondary | ICD-10-CM

## 2015-03-23 DIAGNOSIS — R112 Nausea with vomiting, unspecified: Secondary | ICD-10-CM | POA: Diagnosis not present

## 2015-03-23 DIAGNOSIS — D469 Myelodysplastic syndrome, unspecified: Secondary | ICD-10-CM | POA: Diagnosis not present

## 2015-03-23 DIAGNOSIS — I10 Essential (primary) hypertension: Secondary | ICD-10-CM | POA: Diagnosis not present

## 2015-03-23 DIAGNOSIS — E871 Hypo-osmolality and hyponatremia: Secondary | ICD-10-CM | POA: Diagnosis not present

## 2015-03-23 LAB — CBC
HEMATOCRIT: 22.6 % — AB (ref 39.0–52.0)
HEMOGLOBIN: 8 g/dL — AB (ref 13.0–17.0)
MCH: 35.2 pg — ABNORMAL HIGH (ref 26.0–34.0)
MCHC: 35.4 g/dL (ref 30.0–36.0)
MCV: 99.6 fL (ref 78.0–100.0)
Platelets: 429 10*3/uL — ABNORMAL HIGH (ref 150–400)
RBC: 2.27 MIL/uL — ABNORMAL LOW (ref 4.22–5.81)
RDW: 18.1 % — ABNORMAL HIGH (ref 11.5–15.5)
WBC: 3 10*3/uL — ABNORMAL LOW (ref 4.0–10.5)

## 2015-03-23 LAB — COMPREHENSIVE METABOLIC PANEL
ALT: 16 U/L — AB (ref 17–63)
AST: 21 U/L (ref 15–41)
Albumin: 3.8 g/dL (ref 3.5–5.0)
Alkaline Phosphatase: 42 U/L (ref 38–126)
Anion gap: 8 (ref 5–15)
BILIRUBIN TOTAL: 1.2 mg/dL (ref 0.3–1.2)
BUN: 18 mg/dL (ref 6–20)
CO2: 22 mmol/L (ref 22–32)
CREATININE: 1.03 mg/dL (ref 0.61–1.24)
Calcium: 8.9 mg/dL (ref 8.9–10.3)
Chloride: 97 mmol/L — ABNORMAL LOW (ref 101–111)
GFR calc Af Amer: >60 mL/min (ref >60)
GFR calc non Af Amer: >60 mL/min (ref >60)
Glucose, Bld: 121 mg/dL — ABNORMAL HIGH (ref 70–99)
Potassium: 4.3 mmol/L (ref 3.5–5.1)
SODIUM: 127 mmol/L — AB (ref 135–145)
Total Protein: 5.4 g/dL — ABNORMAL LOW (ref 6.5–8.1)

## 2015-03-23 MED ORDER — SODIUM CHLORIDE 0.9 % IV SOLN
INTRAVENOUS | Status: DC
  Administered 2015-03-23: 09:00:00 via INTRAVENOUS

## 2015-03-23 MED ORDER — LISINOPRIL 10 MG PO TABS: 10.0000 mg | ORAL_TABLET | Freq: Every day | ORAL | Status: DC

## 2015-03-23 NOTE — Evaluation (Signed)
Occupational Therapy Evaluation Patient Details Name: Alexander Rice MRN: [id number] DOB: [birth year] Today's Date: 03/23/2015    History of Present Illness Pt admitted with nausea and vomiting. Pt with past medical history of hypertension, hyperlipidemia, GERD, coronary artery disease (post status of stent placement), BPH, esophageal stricture, MDS   Clinical Impression   Pt displays some confusion this am. No family present to confirm baseline functioning. Pt requiring increased time to follow commands at times, displays decreased safety with donning/doffing LB clothing, attempted to wipe down counter with dirty underwear instead of a towel and had difficulty stating current year, requiring cues to do so. Informed nursing of OT observations. Pt will need 24/7 at d/c for safety. Pt states wife is at home all the time but will need to confirm her availability to provide supervision/assist. Will follow on acute to increase safety and independence with ADL.    Follow Up Recommendations  No OT follow up;Supervision/Assistance - 24 hour    Equipment Recommendations  None recommended by OT (pt declining 3in1)    Recommendations for Other Services       Precautions / Restrictions Precautions Precautions: Fall Restrictions Weight Bearing Restrictions: No      Mobility Bed Mobility Overal bed mobility: Modified Independent                Transfers Overall transfer level: Needs assistance Equipment used: None Transfers: Sit to/from Stand Sit to Stand: Min guard         General transfer comment: held to commode seat to descend to commode.    Balance                                            ADL Overall ADL's : Needs assistance/impaired Eating/Feeding: Independent;Sitting   Grooming: Wash/dry hands;Oral care;Min guard;Standing   Upper Body Bathing: Supervision/ safety;Set up;Sitting   Lower Body Bathing: Min guard;Sit to/from stand   Upper  Body Dressing : Sitting;Minimal assistance   Lower Body Dressing: Minimal assistance;Sit to/from stand   Toilet Transfer: Minimal assistance;Ambulation;Comfort height toilet   Toileting- Clothing Manipulation and Hygiene: Minimal assistance;Sit to/from stand         General ADL Comments: Pt very focused at start of session on when the MD is coming by and wanting to be able to d/c. Able to particpate in toileting and grooming tasks but requires increased time to follow commands at times. he tried to "shake off" his underwear off one leg in standing right before sitting on commode. Pt then stating he still wanted to put this underwear back on. When he finished having BM, pt attempted to stand and step into underwear but OT noticed underwear had some urine soiling on it and informed pt. Pt still  wanting to wear soiled underwear but with more encouragement, pt agreed to take them off. Pt performed oral care at the sink but when done, noted to use underwear to wipe off the sink counter. Pt with forward flexed posture in standing and states he gets stiff in his back often. Informed nursing of safety concerns. Pt required increased time and cues to be able to state current year.      Vision     Perception     Praxis      Pertinent Vitals/Pain Pain Assessment: No/denies pain     Hand Dominance     Extremity/Trunk Assessment Upper Extremity  Assessment Upper Extremity Assessment: Overall WFL for tasks assessed       Cervical / Trunk Assessment Cervical / Trunk Assessment: Kyphotic   Communication Communication Communication: HOH   Cognition Arousal/Alertness: Awake/alert Behavior During Therapy: WFL for tasks assessed/performed Overall Cognitive Status: Impaired/Different from baseline Area of Impairment: Orientation;Following commands;Safety/judgement Orientation Level: Time     Following Commands: Follows one step commands with increased time Safety/Judgement: Decreased  awareness of safety         General Comments       Exercises       Shoulder Instructions      Home Living Family/patient expects to be discharged to:: Private residence Living Arrangements: Spouse/significant other Available Help at Discharge:  (unsure from pt. he reports wife 24/7) Type of Home: House Home Access: Stairs to enter [employer] of Steps: 5 Entrance Stairs-Rails: Right;Left;Can reach both Home Layout: Multi-level;Two level     Bathroom Shower/Tub: Teacher, early years/pre: Standard     Home Equipment: Grab bars - toilet          Prior Functioning/Environment Level of Independence: Independent             OT Diagnosis: Generalized weakness   OT Problem List: Decreased strength;Decreased knowledge of use of DME or AE;Decreased cognition   OT Treatment/Interventions: Self-care/ADL training;Patient/family education;Therapeutic activities;DME and/or AE instruction;Cognitive remediation/compensation    OT Goals(Current goals can be found in the care plan section) Acute Rehab OT Goals Patient Stated Goal: home OT Goal Formulation: With patient Time For Goal Achievement: 04/06/15 Potential to Achieve Goals: Good  OT Frequency: Min 2X/week   Barriers to D/C:            Co-evaluation              End of Session Equipment Utilized During Treatment: Gait belt  Activity Tolerance: Patient tolerated treatment well Patient left: in bed;with call bell/phone within reach;with bed alarm set   Time: [phone] OT Time Calculation (min): 30 min Charges:  OT General Charges $OT Visit: 1 Procedure OT Evaluation $Initial OT Evaluation Tier I: 1 Procedure OT Treatments $Self Care/Home Management : 8-22 mins G-Codes:    Jules Schick 03/23/2015, 9:15 AM  [phone]

## 2015-03-23 NOTE — Progress Notes (Signed)
Patient d/c at this time. Discharged instructions given to daughter earlier,verbalized Alexander Rice back utilized.Prescription given. Patient is stable.Denies pain,no n/v.

## 2015-03-23 NOTE — Progress Notes (Signed)
UR completed 

## 2015-03-23 NOTE — Progress Notes (Signed)
   03/23/15 0915  OT G-codes **NOT FOR INPATIENT CLASS**  Functional Assessment Tool Used clinical judgement  Functional Limitation Self care  Self Care Current Status (K0677) CI  Self Care Goal Status (C3403) CI  Pauline Aus OTR/L [phone] 03/23/2015

## 2015-03-23 NOTE — Consult Note (Signed)
Referral MD  Reason for Referral: Nausea and vomiting. Possible reaction to IV iron   Chief Complaint  Patient presents with  . Emesis  . Nausea  : I am feeling much better  HPI: [name] is very well-known to me. He is in a year-old white gentleman. He has multifactorial anemia. He has underlying myelodysplasia. He has anemia of renal insufficiency. He also has some pernicious anemia.  He is in the office yesterday. He receives Aranesp every 3-4 weeks. He's never had a problem with Aranesp. We did go ahead and give a dose of iron. I will get his blood smear and thought that this iron level was on the low side.  He got IV Feraheme. The dose was 510 mg.  He then had some nausea area and he just did not feel all that well. I did give him some Solu-Medrol and some Pepcid IV. He seemed to get a little bit better. However, at home, he got worse. Because of his age and overall health status, he was brought to the hospital and was admitted area and  His labs look okay. On admission, his hemoglobin was 8.1, which is okay for him. His potassium was 4.3. Creatinine 1.01.  He gets some IV fluids.  He is feeling much better this morning. He would like to go home.  His labs today look okay.  Been no chest pain. He's had no shortness of breath. He's had no diaphoresis. There does have severe osteoarthritis and has been bothered quite a bit by back discomfort.   Past Medical History  Diagnosis Date  . CAD (coronary artery disease)   . GERD (gastroesophageal reflux disease)   . Hyperlipidemia   . Hypertension   . BPH (benign prostatic hyperplasia)   . CVD (cardiovascular disease)   . Adenomatous colon polyp 02/1996, 02/2011    TA polyp 02/2011  . B12 deficiency   . Anemia in chronic renal disease 11/02/2011  . Esophageal stricture   . Diverticulosis   . Hemorrhoids   . MDS (myelodysplastic syndrome)   . Glaucoma   . Heart palpitations   . Hiatal hernia   . History of colonic polyps  09/29/2006    Polyps age 82, Dr. Fuller Plan stated no further colonoscopy due to age    :  Past Surgical History  Procedure Laterality Date  . Carotid endarterectomy    . Ptca      stent  . Quadriceps repair      muscle attachment  . Colonoscopy    . Inguinal hernia repair      right side/ twice  . Tonsillectomy    . Glaucoma surgery Bilateral 11/19/12    pt's report  :   Current facility-administered medications:  .  0.9 %  sodium chloride infusion, , Intravenous, STAT, Wandra Arthurs, MD, Last Rate: 100 mL/hr at 03/22/15 2335 .  acidophilus (RISAQUAD) capsule 1 capsule, 1 capsule, Oral, Daily, Ivor Costa, MD, 1 capsule at 03/22/15 2254 .  alum & mag hydroxide-simeth (MAALOX/MYLANTA) 200-200-20 MG/5ML suspension 30 mL, 30 mL, Oral, Q6H PRN, Ivor Costa, MD .  aspirin chewable tablet 81 mg, 81 mg, Oral, Daily, Ivor Costa, MD, 81 mg at 03/22/15 2254 .  darifenacin (ENABLEX) 24 hr tablet 15 mg, 15 mg, Oral, Daily, Ivor Costa, MD, 15 mg at 03/22/15 2254 .  donepezil (ARICEPT) tablet 10 mg, 10 mg, Oral, QHS, Ivor Costa, MD, 10 mg at 03/22/15 2253 .  dorzolamide (TRUSOPT) 2 % ophthalmic solution 1 drop,  1 drop, Both Eyes, BID, Ivor Costa, MD, 1 drop at 03/22/15 2254 .  doxazosin (CARDURA) tablet 8 mg, 8 mg, Oral, Daily, Ivor Costa, MD, 8 mg at 03/22/15 2254 .  heparin injection 5,000 Units, 5,000 Units, Subcutaneous, 3 times per day, Ivor Costa, MD, 5,000 Units at 03/22/15 2254 .  hydrALAZINE (APRESOLINE) injection 5 mg, 5 mg, Intravenous, Q2H PRN, Ivor Costa, MD .  isosorbide mononitrate (IMDUR) 24 hr tablet 30 mg, 30 mg, Oral, Daily, Ivor Costa, MD, 30 mg at 03/22/15 2254 .  latanoprost (XALATAN) 0.005 % ophthalmic solution 1 drop, 1 drop, Both Eyes, QHS, Ivor Costa, MD, 1 drop at 03/22/15 2252 .  lisinopril (PRINIVIL,ZESTRIL) tablet 10 mg, 10 mg, Oral, Daily, Ivor Costa, MD, 10 mg at 03/22/15 2253 .  multivitamin with minerals tablet 1 tablet, 1 tablet, Oral, Daily, Ivor Costa, MD, 1 tablet at 03/22/15  2254 .  nitroGLYCERIN (NITROSTAT) SL tablet 0.4 mg, 0.4 mg, Sublingual, Q5 min PRN, Ivor Costa, MD .  omega-3 acid ethyl esters (LOVAZA) capsule 1 g, 1 g, Oral, Daily, Ivor Costa, MD, 1 g at 03/22/15 2254 .  ondansetron (ZOFRAN) injection 4 mg, 4 mg, Intravenous, Q8H PRN, Ivor Costa, MD .  simvastatin (ZOCOR) tablet 40 mg, 40 mg, Oral, q1800, Ivor Costa, MD  Facility-Administered Medications Ordered in Other Encounters:  .  darbepoetin (ARANESP) injection 300 mcg, 300 mcg, Subcutaneous, Once, [name], MD:  . sodium chloride   Intravenous STAT  . acidophilus  1 capsule Oral Daily  . aspirin  81 mg Oral Daily  . darifenacin  15 mg Oral Daily  . donepezil  10 mg Oral QHS  . dorzolamide  1 drop Both Eyes BID  . doxazosin  8 mg Oral Daily  . heparin  5,000 Units Subcutaneous 3 times per day  . isosorbide mononitrate  30 mg Oral Daily  . latanoprost  1 drop Both Eyes QHS  . lisinopril  10 mg Oral Daily  . multivitamin with minerals  1 tablet Oral Daily  . omega-3 acid ethyl esters  1 g Oral Daily  . simvastatin  40 mg Oral q1800  :  No Known Allergies:  Family History  Problem Relation Age of Onset  . COPD Mother   . Alcohol abuse Father   :  History   Social History  . Marital Status: Married    Spouse Name: N/A  . Number of Children: 3  . Years of Education: N/A   Occupational History  . Retired    Social History Main Topics  . Smoking status: Never Smoker   . Smokeless tobacco: Never Used     Comment: never used tobacco  . Alcohol Use: 4.2 oz/week    7 Standard drinks or equivalent per week     Comment: Wine/Beer  . Drug Use: No  . Sexual Activity: Not on file   Other Topics Concern  . Not on file   Social History Narrative   Married (wife patient of Dr. Yong Channel as well)      Retired from [employer] logistics/support/administrative officer and [place]      Hobbies: Scientist, research (physical sciences) [employer], [employer] .  :  Pertinent items are noted in HPI.  Exam: Patient Vitals for the past  24 hrs:  BP Temp Temp src Pulse Resp SpO2 Height Weight  03/23/15 0635 (!) 139/54 mmHg 98.5 F (36.9 C) Oral (!) 57 18 99 % - -  03/22/15 2038 (!) 153/43 mmHg 98 F (36.7 C) Axillary  62 16 97 % 5\' 7"  (1.702 m) 153 lb 3.5 oz (69.5 kg)  03/22/15 2009 (!) 130/48 mmHg - - 61 18 98 % - -  03/22/15 1647 (!) 149/50 mmHg - - 63 16 99 % - -    elderly, thin but fairly well-nourished white male. Vital signs as above. Lungs are clear. Cardiac exam regular rate and rhythm. Has 1/6 systolic ejection murmur. Abdomen is soft. He has good bowel sounds. There is no fluid wave. There is no palpable liver or spleen tip. Extremities shows osteoarthritic changes. Skin exam shows some scattered ecchymoses.    Recent Labs  03/22/15 1656 03/23/15 0410  WBC 4.7 3.0*  HGB 8.1* 8.0*  HCT 22.7* 22.6*  PLT 420* 429*    Recent Labs  03/22/15 1656 03/23/15 0410  NA 123* 127*  K 4.3 4.3  CL 95* 97*  CO2 19* 22  GLUCOSE 148* 121*  BUN 21* 18  CREATININE 1.01 1.03  CALCIUM 8.9 8.9    Blood smear review: None  Pathology: None     Assessment and Plan: [name] is a 81 year old gentleman. He may have had a reaction to the IV iron. We do see reactions on occasion. They're very rare. I cannot recall ever having a patient admitted because of a reaction.  If we ever have to give iron in the future, I'm sure that we could try another preparation.  I am grateful for the of standing care that he is getting from the staff on 3 [address]. and from the hospitalist. Hopefully he'll we will be able to go home today. His lab work looks okay. He has some hyponatremia. I'm not sure what that would be from outside of medications. His potassium is okay. His creatinine is okay. Blood sugars are normal.  Again, I appreciate the outstanding care that he is received.  [place] 20:7

## 2015-03-23 NOTE — Care Management Note (Signed)
Case Management Note  Patient Details  Name: Alexander Rice MRN: [id number] Date of Birth: April 30, 1932  Subjective/Objective:  81 yo admitted with nausea and vomitting                  Action/Plan: From home with spouse  Expected Discharge Date:  03/23/15               Expected Discharge Plan:  Home/Self Care  In-House Referral:     Discharge planning Services  CM Consult  Post Acute Care Choice:    Choice offered to:     DME Arranged:    DME Agency:     HH Arranged:    [place] Agency:     Status of Service:     Medicare Important Message Given:    Date Medicare IM Given:    Medicare IM give by:    Date Additional Medicare IM Given:    Additional Medicare Important Message give by:     If discussed at [place] of Stay Meetings, dates discussed:    Additional Comments: Per PT eval pt could benefit from HHPT. This CM met with pt at bedside to offer choice. Pt politely declines [facility] services. No other DC needs noted.  Lynnell Catalan, RN 03/23/2015, 2:04 PM

## 2015-03-23 NOTE — Evaluation (Signed)
Physical Therapy Evaluation Patient Details Name: Alexander Rice MRN: [id number] DOB: 1932-09-10 Today's Date: 03/23/2015   History of Present Illness  Pt admitted with nausea and vomiting. Pt with past medical history of hypertension, hyperlipidemia, GERD, coronary artery disease (post status of stent placement), BPH, esophageal stricture, MDS  Clinical Impression  Pt admitted as above and presenting with functional mobility limitations 2* generalized weakness, ambulatory balance deficits and questionable safety awareness.  Pt could benefit from follow up HHPT to address deficits but states he does not wish to have HHPT follow up.  Pt states he does have RW at home and will use to assist with mobility.    Follow Up Recommendations Home health PT (Would benefit for balance/saftey but states does not want HH)    Equipment Recommendations  None recommended by PT    Recommendations for Other Services       Precautions / Restrictions Precautions Precautions: Fall Restrictions Weight Bearing Restrictions: No      Mobility  Bed Mobility Overal bed mobility: Modified Independent                Transfers Overall transfer level: Needs assistance Equipment used: None Transfers: Sit to/from Stand Sit to Stand: Supervision         General transfer comment: cues for use of UEs to self assist with safe transition  Ambulation/Gait Ambulation/Gait assistance: Min assist;Min guard;Supervision Ambulation Distance (Feet): 340 Feet Assistive device: Rolling walker (2 wheeled);None Gait Pattern/deviations: Step-through pattern;Shuffle;Scissoring;Narrow base of support;Drifts right/left Gait velocity: cues to decreased pace for saftey with deterioration in balance with increase in speed   General Gait Details: Pt ambulated initial 140' sans assist device but requiring min assist for balance and cues to slow pace and for saftey awareness.  Pt ambulated 200' with RW progressing to  supervision with cues for pace and position from RW.  Pt states he has RW at home.  Stairs            Wheelchair Mobility    Modified Rankin (Stroke Patients Only)       Balance                                             Pertinent Vitals/Pain Pain Assessment: No/denies pain    Home Living Family/patient expects to be discharged to:: Private residence Living Arrangements: Spouse/significant other Available Help at Discharge:  (unsure from pt. he reports wife 24/7) Type of Home: House Home Access: Stairs to enter Entrance Stairs-Rails: Right;Left;Can reach both Entrance Stairs-Number of Steps: 5 Home Layout: Multi-level;Two level Home Equipment: Walker - 2 wheels      Prior Function Level of Independence: Independent         Comments: States having problems with walking distance 2* "my back is worn out"     Hand Dominance        Extremity/Trunk Assessment   Upper Extremity Assessment: Generalized weakness           Lower Extremity Assessment: Generalized weakness      Cervical / Trunk Assessment: Kyphotic  Communication   Communication: HOH  Cognition Arousal/Alertness: Awake/alert Behavior During Therapy: WFL for tasks assessed/performed Overall Cognitive Status: Within Functional Limits for tasks assessed Area of Impairment: Safety/judgement Orientation Level: Time     Following Commands: Follows one step commands consistently Safety/Judgement: Decreased awareness of safety     General Comments:  Cued for awareness of balance deficits with ambulation.  Pt acknowledges improvment in gait with use of RW    General Comments      Exercises        Assessment/Plan    PT Assessment Patient needs continued PT services  PT Diagnosis Difficulty walking   PT Problem List Decreased activity tolerance;Decreased balance;Decreased mobility;Decreased knowledge of use of DME;Decreased safety awareness  PT Treatment  Interventions DME instruction;Gait training;Functional mobility training;Stair training;Therapeutic activities;Therapeutic exercise;Balance training;Patient/family education   PT Goals (Current goals can be found in the Care Plan section) Acute Rehab PT Goals Patient Stated Goal: home PT Goal Formulation: With patient Time For Goal Achievement: 03/30/15 Potential to Achieve Goals: Good    Frequency Min 3X/week   Barriers to discharge        Co-evaluation               End of Session Equipment Utilized During Treatment: Gait belt Activity Tolerance: Patient tolerated treatment well Patient left: in bed;with call bell/phone within reach Nurse Communication: Mobility status    Functional Assessment Tool Used: Clinical judgement Functional Limitation: Mobility: Walking and moving around Mobility: Walking and Moving Around Current Status (I4580): At least 1 percent but less than 20 percent impaired, limited or restricted Mobility: Walking and Moving Around Goal Status [phone]): At least 1 percent but less than 20 percent impaired, limited or restricted    Time: 0948-1008 PT Time Calculation (min) (ACUTE ONLY): 20 min   Charges:   PT Evaluation $Initial PT Evaluation Tier I: 1 Procedure     PT G Codes:   PT G-Codes **NOT FOR INPATIENT CLASS** Functional Assessment Tool Used: Clinical judgement Functional Limitation: Mobility: Walking and moving around Mobility: Walking and Moving Around Current Status (A2505): At least 1 percent but less than 20 percent impaired, limited or restricted Mobility: Walking and Moving Around Goal Status [phone]): At least 1 percent but less than 20 percent impaired, limited or restricted    Darek Eifler 03/23/2015, 10:21 AM

## 2015-03-23 NOTE — Discharge Summary (Signed)
Physician Discharge Summary  Alexander Rice BMW:[id number] DOB: 1932/06/06 DOA: 03/22/2015  PCP: [name], MD  Admit date: 03/22/2015 Discharge date: 03/23/2015  Time spent: 65 minutes  Recommendations for Outpatient Follow-up:  1. Follow up with [name], MD in 1 week. Will need BMET on follow up in 1 week to follow up on electrolytes and renal function. Patient will need BP reassessed on follow up.  Discharge Diagnoses:  Principal Problem:   Nausea & vomiting Active Problems:   Hyponatremia   Essential hypertension   CAD (coronary artery disease)   GERD   BPH (benign prostatic hyperplasia)   MDS (myelodysplastic syndrome)   Pulmonary nodule   Memory loss   Pernicious anemia   Discharge Condition: Improved  Diet recommendation: Regular  Filed Weights   03/22/15 2038  Weight: 69.5 kg (153 lb 3.5 oz)    History of present illness:  Alexander Rice is a 81 y.o. male with past medical history of hypertension, hyperlipidemia, GERD, coronary artery disease (post status of stent placement), BPH, esophageal stricture, MDS, who presented with nausea and vomiting.  Patient reported that he was in the cancer center on day of admission, and had iron infusion for the first time and had Aranesp injection. Patient then developed nausea. He was given Pepcid and cortisol with little improvement. On the way home, patient had worsening nausea and vomited twice. There was no blood in the vomitus. He did not have shortness of breath or rashes. Denied any throat swelling. His symptoms improved significantly after received 1 dose of Zofran in emergency room.  Currently patient denied fever, chills, fatigue, running nose, ear pain, headaches, cough, chest pain, SOB, abdominal pain, diarrhea, dysuria, urgency, frequency, hematuria, skin rashes or leg swelling. No unilateral weakness, numbness or tingling sensations. No vision change or hearing loss.  In ED, patient was found to have  sodium of 123, WBC 4.7, normal temperature, slightly bradycardia, renal function okay, total bilirubin 1.4 with normal AST and ALT.  Hospital Course:  #1 nausea /vomiting Patient was admitted with nausea and vomiting after receiving IV Feraheme. It was felt this may have been secondary to a reaction from iron. Patient also however was noted to have a sodium of 123 on admission. Patient symptoms improved with Zofran. Patient was admitted placed on IV fluids. Patient improved symptomatically did not have any further episodes of nausea and emesis and was tolerating a regular diet. Patient will be discharged in stable condition.  #2 hyponatremia On admission patient was noted to be hyponatremic with a sodium of 123. It was felt this was likely secondary to hypovolemic hyponatremia as patient had presented with nausea and emesis and also noted to be on Prinzide. Prinzide was discontinued. Patient was placed on IV fluids. Patient remained asymptomatic. Patient's sodium levels improved to 127 on day of discharge. Patient was insistent on being discharged and a such will be discharged office Prinzide and is to follow-up with his PCP for lab work to be done in one week to follow-up on his hyponatremia. Patient be discharged in stable and improved condition.  #3 hypertension Patient's Prinzide was discontinued patient was maintained on lisinopril. Patient be discharged on lisinopril outpatient follow-up.  #4 MDS Hemoglobin was 6.8 on 03/01/15. Patient's hemoglobin remained stable during the hospitalization at 8.0. Outpatient follow-up. Patient is to follow-up with Dr. Marin Olp of Heme/oncology.  The rest of patient's chronic medical issues remained stable throughout the hospitalization and patient be discharged in stable and improved condition.  Procedures:  None  Consultations:  Dr. Marin Olp 03/23/15  Discharge Exam: Filed Vitals:   03/23/15 0928  BP: 134/52  Pulse:   Temp:   Resp:     General:  NAD Cardiovascular: RRR Respiratory: CTAB  Discharge Instructions   Discharge Instructions    Diet general    Complete by:  As directed      Discharge instructions    Complete by:  As directed   Follow up with [name], MD in 1 week. Follow up with Dr Marin Olp as scheduled.     Increase activity slowly    Complete by:  As directed           Current Discharge Medication List    START taking these medications   Details  lisinopril (PRINIVIL,ZESTRIL) 10 MG tablet Take 1 tablet (10 mg total) by mouth daily. Qty: 30 tablet, Refills: 0      CONTINUE these medications which have NOT CHANGED   Details  aspirin 81 MG tablet Take 81 mg by mouth daily.      [employer] OMEGA-3 KRILL OIL PO Take 350 mg by mouth daily.    donepezil (ARICEPT) 10 MG tablet Take 1/2 tablet daily for 1 week, then increase to 1 tablet daily Qty: 30 tablet, Refills: 4   Associated Diagnoses: Memory loss    dorzolamide (TRUSOPT) 2 % ophthalmic solution Place 1 drop into both eyes 2 (two) times daily.      doxazosin (CARDURA) 8 MG tablet Take 1 tablet (8 mg total)  by mouth at bedtime. Qty: 90 tablet, Refills: 2    isosorbide mononitrate (IMDUR) 30 MG 24 hr tablet Take 1 tablet by mouth  (30mg ) daily Qty: 90 tablet, Refills: 2    LUMIGAN 0.01 % SOLN Place 1 drop into both eyes at bedtime.     Multiple Vitamin (MULTIVITAMIN) tablet Take 1 tablet by mouth daily.      nitroGLYCERIN (NITROSTAT) 0.4 MG SL tablet Place 0.4 mg under the tongue every 5 (five) minutes as needed.      PRESCRIPTION MEDICATION Supportive therapy [facility]    Probiotic Product ([employer] PROBIOTIC) CHEW Chew by mouth daily.    simvastatin (ZOCOR) 40 MG tablet TAKE 1 TABLET BY MOUTH  DAILY AT 6 PM Qty: 90 tablet, Refills: 2    VESICARE 10 MG tablet Take 10 mg by mouth daily.    Associated Diagnoses: MDS (myelodysplastic syndrome); Other vitamin B12 deficiency anemia      STOP taking these medications      lisinopril-hydrochlorothiazide (PRINZIDE,ZESTORETIC) 10-12.5 MG per tablet      potassium chloride SA (K-DUR,KLOR-CON) 20 MEQ tablet        No Known Allergies Follow-up Information    Follow up with [name], MD. Schedule an appointment as soon as possible for a visit in 1 week.   Specialty:  Family Medicine   Contact information:   [address] [place] [address] [phone]       Follow up with [name], MD.   Specialty:  Family Medicine   Why:  Pt states he has an appt for may 17th   Contact information:   [place] [address] [phone]        The results of significant diagnostics from this hospitalization (including imaging, microbiology, ancillary and laboratory) are listed below for reference.    Significant Diagnostic Studies: Mr Herby Abraham Contrast  03/18/2015   CLINICAL DATA:  Memory loss and confusion over the past 6 months.  Hearing loss difficulty speaking. Newly diagnosed dementia.  EXAM: MRI HEAD WITHOUT CONTRAST  TECHNIQUE: Multiplanar, multiecho pulse sequences of the brain and surrounding structures were obtained without intravenous contrast.  COMPARISON:  None.  FINDINGS: Moderate generalized atrophy and white matter disease is present bilaterally. No acute cortical infarct, hemorrhage, or mass lesion is present.  Flow is present in the major intracranial arteries. White matter changes extend into the brainstem. Dilated perivascular spaces are present within the basal ganglia bilaterally.  The ventricles are proportionate to the degree of atrophy. Insert pass fluid  Small polyps or mucous retention cysts are seen inferiorly in the left maxillary sinus. There is mild mucosal thickening in the right maxillary sinus. The paranasal sinuses are otherwise clear. Minimal fluid is noted in the inferior mastoid air cells bilaterally. No obstructing nasopharyngeal lesion is present.  The skullbase is within normal limits.  Midline structures are unremarkable.  IMPRESSION: 1. Moderate atrophy and diffuse white matter disease. This likely reflects the sequela of chronic microvascular ischemia. 2. Acute intracranial abnormality.   Electronically Signed   By: San Morelle M.D.   On: 03/18/2015 17:28    Microbiology: No results found for this or any previous visit (from the past 240 hour(s)).   Labs: Basic Metabolic Panel:  Recent Labs Lab 03/22/15 0841 03/22/15 1656 03/23/15 0410  NA 130 123* 127*  K 4.6 4.3 4.3  CL 96* 95* 97*  CO2 25 19* 22  GLUCOSE 101 148* 121*  BUN 20 21* 18  CREATININE 1.3* 1.01 1.03  CALCIUM 8.9 8.9 8.9   Liver Function Tests:  Recent Labs Lab 03/22/15 0841 03/22/15 1656 03/23/15 0410  AST 23 21 21   ALT 18 18 16*  ALKPHOS 48 45 42  BILITOT 1.20 1.4* 1.2  PROT 6.0* 6.0* 5.4*  ALBUMIN  --  4.3 3.8    Recent Labs Lab 03/22/15 1656  LIPASE 33   No results for input(s): AMMONIA in the last 168 hours. CBC:  Recent Labs Lab 03/22/15 0841 03/22/15 1656 03/23/15 0410  WBC 3.1* 4.7 3.0*  NEUTROABS 1.8 4.4  --   HGB 8.0* 8.1* 8.0*  HCT 22.9* 22.7* 22.6*  MCV 102* 99.1 99.6  PLT 439* 420* 429*   Cardiac Enzymes: No results for input(s): CKTOTAL, CKMB, CKMBINDEX, TROPONINI in the last 168 hours. BNP: BNP (last 3 results) No results for input(s): BNP in the last 8760 hours.  ProBNP (last 3 results) No results for input(s): PROBNP in the last 8760 hours.  CBG: No results for input(s): GLUCAP in the last 168 hours.     SignedIrine Seal MD [facility] 03/23/2015, 12:32 PM

## 2015-03-23 NOTE — Telephone Encounter (Signed)
Pt has been scheduled for:

## 2015-03-24 ENCOUNTER — Telehealth: Payer: Self-pay | Admitting: Family Medicine

## 2015-03-24 NOTE — Telephone Encounter (Signed)
-----   Message from Cameron Sprang, MD sent at 03/21/2015  1:02 PM EDT ----- Pls let patient/family know I reviewed MRI brain, no evidence of tumor, stroke, or bleed. It shows age-related changes. Thanks

## 2015-03-24 NOTE — Telephone Encounter (Signed)
Patient was notified of results. Per his request a copy of results were mailed to him.

## 2015-03-31 ENCOUNTER — Ambulatory Visit (INDEPENDENT_AMBULATORY_CARE_PROVIDER_SITE_OTHER): Payer: Medicare Other | Admitting: Family Medicine

## 2015-03-31 ENCOUNTER — Encounter: Payer: Self-pay | Admitting: Family Medicine

## 2015-03-31 VITALS — BP 132/40 | HR 74 | Temp 97.4°F | Wt 152.0 lb

## 2015-03-31 DIAGNOSIS — E871 Hypo-osmolality and hyponatremia: Secondary | ICD-10-CM | POA: Diagnosis not present

## 2015-03-31 DIAGNOSIS — R112 Nausea with vomiting, unspecified: Secondary | ICD-10-CM

## 2015-03-31 DIAGNOSIS — G309 Alzheimer's disease, unspecified: Secondary | ICD-10-CM | POA: Diagnosis not present

## 2015-03-31 DIAGNOSIS — E538 Deficiency of other specified B group vitamins: Secondary | ICD-10-CM | POA: Diagnosis not present

## 2015-03-31 DIAGNOSIS — R531 Weakness: Secondary | ICD-10-CM

## 2015-03-31 DIAGNOSIS — M4806 Spinal stenosis, lumbar region: Secondary | ICD-10-CM

## 2015-03-31 DIAGNOSIS — M48061 Spinal stenosis, lumbar region without neurogenic claudication: Secondary | ICD-10-CM

## 2015-03-31 DIAGNOSIS — F03C0 Unspecified dementia, severe, without behavioral disturbance, psychotic disturbance, mood disturbance, and anxiety: Secondary | ICD-10-CM | POA: Insufficient documentation

## 2015-03-31 DIAGNOSIS — F0280 Dementia in other diseases classified elsewhere without behavioral disturbance: Secondary | ICD-10-CM

## 2015-03-31 DIAGNOSIS — F0390 Unspecified dementia without behavioral disturbance: Secondary | ICD-10-CM | POA: Insufficient documentation

## 2015-03-31 MED ORDER — CYANOCOBALAMIN 1000 MCG/ML IJ SOLN
1000.0000 ug | Freq: Once | INTRAMUSCULAR | Status: AC
  Administered 2015-03-31: 1000 ug via INTRAMUSCULAR

## 2015-03-31 NOTE — Assessment & Plan Note (Signed)
Was hydrated in hospital and now off hctz. Check bmet today to evaluate sodium level. Will also need to decide as currently taking 39meq potassium if he should continue this. Off hctz suspicious he will not need this.

## 2015-03-31 NOTE — Progress Notes (Signed)
[name], MD  Subjective:  Alexander Rice is a 81 y.o. year old very pleasant male patient who presents for transitional care management. I asked for patient to be scheduled on 03/23/15 by staff as below:  Can we schedule patient for hospital follow up please within next week? Can be a 15 minute slot if no 30s available.   In addition, Libby Maw reached out to patient on 03/23/15  At 3:47 pm after patient discharged on 03/23/15 and scheduled visit.   Patient was admitted to the hospital after being found hyponatremic after several episodes of nausea and vomiting after iron infusion at the cancer center where he is followed for MDS. His potassium was as low as 123 and with rehydration improved to 127 before discharge. Had desired another day stay from impression of discharge summary but patient wanted to go home. They also stopped his HCTZ and continued lisinopril. Appears they discontinued potassium but patient states he continues to take this.   Since this episode, patient has dealt with Generalized weakness and is having trouble getting his strength back. PT had evaled and suggested HHPT but patient declined. He also has Spinal stenosis lumbar and some limited mobility and walks with cane/walker majoirty of time.   Overall though patient feels better and has had no further vomiting or nausea. He will be seeing oncology on 31st and getting labs at that time.   ROS- no dizziness, lightheadedness, chest pain, shortness of breath  Past Medical History- CAD, MDS, alzheimers dementia with MMSE 21/30 on aricept  Medications- Full medication reconciliation performed.  Current Outpatient Prescriptions  Medication Sig Dispense Refill  . aspirin 81 MG tablet Take 81 mg by mouth daily.      . [facility] OMEGA-3 KRILL OIL PO Take 350 mg by mouth daily.    [name] donepezil (ARICEPT) 10 MG tablet Take 1/2 tablet daily for 1 week, then increase to 1 tablet daily (Patient taking differently: Take 10 mg by  mouth. ) 30 tablet 4  . doxazosin (CARDURA) 8 MG tablet Take 1 tablet (8 mg total)  by mouth at bedtime. 90 tablet 2  . isosorbide mononitrate (IMDUR) 30 MG 24 hr tablet Take 1 tablet by mouth  (30mg ) daily 90 tablet 2  . lisinopril (PRINIVIL,ZESTRIL) 10 MG tablet Take 1 tablet (10 mg total) by mouth daily. 30 tablet 0  . LUMIGAN 0.01 % SOLN Place 1 drop into both eyes at bedtime.     . Multiple Vitamin (MULTIVITAMIN) tablet Take 1 tablet by mouth daily.      . Probiotic Product (CVS PROBIOTIC) CHEW Chew by mouth daily.    . simvastatin (ZOCOR) 40 MG tablet TAKE 1 TABLET BY MOUTH  DAILY AT 6 PM 90 tablet 2  . VESICARE 10 MG tablet Take 10 mg by mouth daily.     . dorzolamide (TRUSOPT) 2 % ophthalmic solution Place 1 drop into both eyes 2 (two) times daily.      . nitroGLYCERIN (NITROSTAT) 0.4 MG SL tablet Place 0.4 mg under the tongue every 5 (five) minutes as needed.       Objective: BP 132/40 mmHg  Pulse 74  Temp(Src) 97.4 F (36.3 C)  Wt 152 lb (68.947 kg) Gen: NAD, resting comfortably Mucous membranes are moist. CV: RRR no murmurs rubs or gallops Lungs: CTAB no crackles, wheeze, rhonchi Abdomen: soft/nontender/nondistended/normal bowel sounds. No rebound or guarding.  Ext: no edema Skin: warm, dry MSK: walks stooped over, kyphotic   Assessment/Plan:  Hyponatremia Was  hydrated in hospital and now off hctz. Check bmet today to evaluate sodium level. Will also need to decide as currently taking 49meq potassium if he should continue this. Off hctz suspicious he will not need this.    Nausea & vomiting Likely related to iron infusion. Now resolved. He will have to use a different formulation in future. No antiemetics needed since discharge. Likely led to hyponatremia    Also given his generalized weakness and history of lumbar stenosis-I suggest he does proceed forward with home health PT which has been ordered at this time.   Return precautions advised. Likely can plan to  follow labs done on the 31st if labs reasonable today.   Orders Placed This Encounter  Procedures  . Basic metabolic panel    [place]  . Ambulatory referral to Home Health    Referral Priority:  Routine    Referral Type:  Home Health Care    Referral Reason:  Specialty Services Required    Requested Specialty:  [facility]    Number of Visits Requested:  1   Meds ordered this encounter  Medications  . cyanocobalamin ((VITAMIN B-12)) injection 1,000 mcg    Sig:

## 2015-03-31 NOTE — Patient Instructions (Addendum)
Check sodium today. Likely stop potassium depending on results.   Referred for home health physical therapy evaluation  I know oncology will follow you but I can see you back to follow up bloodwork if needed in next month

## 2015-03-31 NOTE — Assessment & Plan Note (Signed)
Likely related to iron infusion. Now resolved. He will have to use a different formulation in future. No antiemetics needed since discharge. Likely led to hyponatremia

## 2015-04-01 LAB — BASIC METABOLIC PANEL
BUN: 18 mg/dL (ref 6–23)
CHLORIDE: 99 meq/L (ref 96–112)
CO2: 22 meq/L (ref 19–32)
CREATININE: 1.51 mg/dL — AB (ref 0.40–1.50)
Calcium: 8.8 mg/dL (ref 8.4–10.5)
GFR: 47.43 mL/min — AB (ref >60.00)
Glucose, Bld: 128 mg/dL — ABNORMAL HIGH (ref 70–99)
POTASSIUM: 4.5 meq/L (ref 3.5–5.1)
Sodium: 128 mEq/L — ABNORMAL LOW (ref 135–145)

## 2015-04-04 DIAGNOSIS — M40209 Unspecified kyphosis, site unspecified: Secondary | ICD-10-CM | POA: Diagnosis not present

## 2015-04-04 DIAGNOSIS — F0280 Dementia in other diseases classified elsewhere without behavioral disturbance: Secondary | ICD-10-CM | POA: Diagnosis not present

## 2015-04-04 DIAGNOSIS — G300 Alzheimer's disease with early onset: Secondary | ICD-10-CM | POA: Diagnosis not present

## 2015-04-04 DIAGNOSIS — R531 Weakness: Secondary | ICD-10-CM | POA: Diagnosis not present

## 2015-04-05 ENCOUNTER — Telehealth: Payer: Self-pay

## 2015-04-05 ENCOUNTER — Telehealth: Payer: Self-pay | Admitting: *Deleted

## 2015-04-05 ENCOUNTER — Ambulatory Visit: Payer: Medicare Other | Admitting: Family Medicine

## 2015-04-05 NOTE — Telephone Encounter (Signed)
-----   Message from Tanda Rockers, MD sent at 03/31/2014  3:28 PM EDT ----- Ov with cxr due re MPNs

## 2015-04-05 NOTE — Telephone Encounter (Signed)
Alexander Rice from [facility] is wanting a verbal to go out and work with pt 3 times a week for one week and 2 times a week for 3 weeks to help with balance, gait, endurance and weakness. Ok to give verbal?

## 2015-04-05 NOTE — Telephone Encounter (Signed)
Absolutely thanks! 

## 2015-04-05 NOTE — Telephone Encounter (Signed)
Spoke with the pt and notified that he is due for ov with CXR  He verbalized understanding  States will have to call us back to schedule appt b/c he is busy at this time

## 2015-04-06 DIAGNOSIS — G300 Alzheimer's disease with early onset: Secondary | ICD-10-CM | POA: Diagnosis not present

## 2015-04-06 DIAGNOSIS — F0280 Dementia in other diseases classified elsewhere without behavioral disturbance: Secondary | ICD-10-CM | POA: Diagnosis not present

## 2015-04-06 DIAGNOSIS — M40209 Unspecified kyphosis, site unspecified: Secondary | ICD-10-CM | POA: Diagnosis not present

## 2015-04-06 DIAGNOSIS — R531 Weakness: Secondary | ICD-10-CM | POA: Diagnosis not present

## 2015-04-07 NOTE — Telephone Encounter (Signed)
Alexander Rice returned call and given the verbal order.

## 2015-04-07 NOTE — Telephone Encounter (Signed)
Called and left another message on David vm tcb.

## 2015-04-08 DIAGNOSIS — G300 Alzheimer's disease with early onset: Secondary | ICD-10-CM | POA: Diagnosis not present

## 2015-04-08 DIAGNOSIS — M40209 Unspecified kyphosis, site unspecified: Secondary | ICD-10-CM | POA: Diagnosis not present

## 2015-04-08 DIAGNOSIS — F0280 Dementia in other diseases classified elsewhere without behavioral disturbance: Secondary | ICD-10-CM | POA: Diagnosis not present

## 2015-04-08 DIAGNOSIS — R531 Weakness: Secondary | ICD-10-CM | POA: Diagnosis not present

## 2015-04-12 DIAGNOSIS — G300 Alzheimer's disease with early onset: Secondary | ICD-10-CM | POA: Diagnosis not present

## 2015-04-12 DIAGNOSIS — R531 Weakness: Secondary | ICD-10-CM | POA: Diagnosis not present

## 2015-04-12 DIAGNOSIS — F0280 Dementia in other diseases classified elsewhere without behavioral disturbance: Secondary | ICD-10-CM | POA: Diagnosis not present

## 2015-04-12 DIAGNOSIS — M40209 Unspecified kyphosis, site unspecified: Secondary | ICD-10-CM | POA: Diagnosis not present

## 2015-04-15 DIAGNOSIS — M40209 Unspecified kyphosis, site unspecified: Secondary | ICD-10-CM | POA: Diagnosis not present

## 2015-04-15 DIAGNOSIS — F0280 Dementia in other diseases classified elsewhere without behavioral disturbance: Secondary | ICD-10-CM | POA: Diagnosis not present

## 2015-04-15 DIAGNOSIS — R531 Weakness: Secondary | ICD-10-CM | POA: Diagnosis not present

## 2015-04-15 DIAGNOSIS — G300 Alzheimer's disease with early onset: Secondary | ICD-10-CM | POA: Diagnosis not present

## 2015-04-18 DIAGNOSIS — F0280 Dementia in other diseases classified elsewhere without behavioral disturbance: Secondary | ICD-10-CM | POA: Diagnosis not present

## 2015-04-18 DIAGNOSIS — M40209 Unspecified kyphosis, site unspecified: Secondary | ICD-10-CM | POA: Diagnosis not present

## 2015-04-18 DIAGNOSIS — G300 Alzheimer's disease with early onset: Secondary | ICD-10-CM | POA: Diagnosis not present

## 2015-04-18 DIAGNOSIS — R531 Weakness: Secondary | ICD-10-CM | POA: Diagnosis not present

## 2015-04-19 ENCOUNTER — Other Ambulatory Visit (HOSPITAL_BASED_OUTPATIENT_CLINIC_OR_DEPARTMENT_OTHER): Payer: Medicare Other

## 2015-04-19 ENCOUNTER — Ambulatory Visit (HOSPITAL_BASED_OUTPATIENT_CLINIC_OR_DEPARTMENT_OTHER): Payer: Medicare Other | Admitting: Hematology & Oncology

## 2015-04-19 ENCOUNTER — Encounter: Payer: Self-pay | Admitting: Hematology & Oncology

## 2015-04-19 ENCOUNTER — Telehealth: Payer: Self-pay | Admitting: Family Medicine

## 2015-04-19 ENCOUNTER — Ambulatory Visit (HOSPITAL_BASED_OUTPATIENT_CLINIC_OR_DEPARTMENT_OTHER): Payer: Medicare Other

## 2015-04-19 VITALS — BP 122/31 | HR 72 | Temp 97.9°F | Resp 16 | Wt 152.0 lb

## 2015-04-19 DIAGNOSIS — D469 Myelodysplastic syndrome, unspecified: Secondary | ICD-10-CM

## 2015-04-19 DIAGNOSIS — D464 Refractory anemia, unspecified: Secondary | ICD-10-CM

## 2015-04-19 DIAGNOSIS — D518 Other vitamin B12 deficiency anemias: Secondary | ICD-10-CM

## 2015-04-19 DIAGNOSIS — D46Z Other myelodysplastic syndromes: Secondary | ICD-10-CM | POA: Diagnosis not present

## 2015-04-19 DIAGNOSIS — D460 Refractory anemia without ring sideroblasts, so stated: Secondary | ICD-10-CM | POA: Diagnosis not present

## 2015-04-19 DIAGNOSIS — D649 Anemia, unspecified: Secondary | ICD-10-CM

## 2015-04-19 DIAGNOSIS — N289 Disorder of kidney and ureter, unspecified: Secondary | ICD-10-CM

## 2015-04-19 DIAGNOSIS — D4620 Refractory anemia with excess of blasts, unspecified: Secondary | ICD-10-CM

## 2015-04-19 DIAGNOSIS — D510 Vitamin B12 deficiency anemia due to intrinsic factor deficiency: Secondary | ICD-10-CM

## 2015-04-19 LAB — CBC WITH DIFFERENTIAL (CANCER CENTER ONLY)
BASO#: 0 10*3/uL (ref 0.0–0.2)
BASO%: 0 % (ref 0.0–2.0)
EOS%: 1.4 % (ref 0.0–7.0)
Eosinophils Absolute: 0.1 10*3/uL (ref 0.0–0.5)
HEMATOCRIT: 21.2 % — AB (ref 38.7–49.9)
HGB: 7.6 g/dL — ABNORMAL LOW (ref 13.0–17.1)
LYMPH#: 0.9 10*3/uL (ref 0.9–3.3)
LYMPH%: 25.6 % (ref 14.0–48.0)
MCH: 36.4 pg — ABNORMAL HIGH (ref 28.0–33.4)
MCHC: 35.8 g/dL (ref 32.0–35.9)
MCV: 101 fL — AB (ref 82–98)
MONO#: 0.3 10*3/uL (ref 0.1–0.9)
MONO%: 8.6 % (ref 0.0–13.0)
NEUT#: 2.2 10*3/uL (ref 1.5–6.5)
NEUT%: 64.4 % (ref 40.0–80.0)
Platelets: 345 10*3/uL (ref 145–400)
RBC: 2.09 10*6/uL — ABNORMAL LOW (ref 4.20–5.70)
RDW: 16.5 % — ABNORMAL HIGH (ref 11.1–15.7)
WBC: 3.5 10*3/uL — ABNORMAL LOW (ref 4.0–10.0)

## 2015-04-19 LAB — RETICULOCYTES (CHCC)
ABS RETIC: 8.6 10*3/uL — AB (ref 19.0–186.0)
RBC.: 2.14 MIL/uL — ABNORMAL LOW (ref 4.22–5.81)
Retic Ct Pct: 0.4 % (ref 0.4–2.3)

## 2015-04-19 LAB — IRON AND TIBC CHCC
%SAT: 90 % — AB (ref 20–55)
IRON: 168 ug/dL — AB (ref 42–163)
TIBC: 186 ug/dL — AB (ref 202–409)
UIBC: 18 ug/dL — AB (ref 117–376)

## 2015-04-19 LAB — FERRITIN CHCC: Ferritin: 2165 ng/ml — ABNORMAL HIGH (ref 22–316)

## 2015-04-19 LAB — CHCC SATELLITE - SMEAR

## 2015-04-19 MED ORDER — EPOETIN ALFA 40000 UNIT/ML IJ SOLN
INTRAMUSCULAR | Status: AC
  Filled 2015-04-19: qty 1

## 2015-04-19 MED ORDER — EPOETIN ALFA 40000 UNIT/ML IJ SOLN
40000.0000 [IU] | Freq: Once | INTRAMUSCULAR | Status: AC
  Administered 2015-04-19: 40000 [IU] via SUBCUTANEOUS

## 2015-04-19 NOTE — Telephone Encounter (Signed)
See below

## 2015-04-19 NOTE — Telephone Encounter (Signed)
Pt saw dr Marin Olp and per pt daughter  he suggest that pt be taken off lisinopril. Pt is currently taking  3 bp med. Pt bp is low per daughter Juliann Pulse. Please advise

## 2015-04-19 NOTE — Telephone Encounter (Signed)
Reviewed BP at onc office and similarly low to when here last. Had expected to improve as further out from hospitalization. Ok to stop lisinopril and remove from medication list. I would be happy to see patient back for BP check if desired within a few weeks.

## 2015-04-19 NOTE — Patient Instructions (Signed)

## 2015-04-19 NOTE — Telephone Encounter (Signed)
Medication taken off of pt list and advised of Dr. Venida Jarvis, daughter states they will have BP checked when he see's Dr. Mitzi Davenport next week.

## 2015-04-20 ENCOUNTER — Encounter: Payer: Self-pay | Admitting: Internal Medicine

## 2015-04-20 ENCOUNTER — Ambulatory Visit (INDEPENDENT_AMBULATORY_CARE_PROVIDER_SITE_OTHER)
Admission: RE | Admit: 2015-04-20 | Discharge: 2015-04-20 | Disposition: A | Discharge status: Home / Self Care | Payer: Medicare Other | Source: Ambulatory Visit | Attending: Internal Medicine | Admitting: Internal Medicine

## 2015-04-20 ENCOUNTER — Other Ambulatory Visit: Payer: Self-pay | Admitting: Internal Medicine

## 2015-04-20 ENCOUNTER — Ambulatory Visit (INDEPENDENT_AMBULATORY_CARE_PROVIDER_SITE_OTHER): Payer: Medicare Other | Admitting: Internal Medicine

## 2015-04-20 VITALS — BP 126/56 | HR 59 | Ht 69.0 in | Wt 151.0 lb

## 2015-04-20 DIAGNOSIS — I2510 Atherosclerotic heart disease of native coronary artery without angina pectoris: Secondary | ICD-10-CM

## 2015-04-20 DIAGNOSIS — J449 Chronic obstructive pulmonary disease, unspecified: Secondary | ICD-10-CM | POA: Diagnosis not present

## 2015-04-20 DIAGNOSIS — R911 Solitary pulmonary nodule: Secondary | ICD-10-CM | POA: Diagnosis not present

## 2015-04-20 NOTE — Addendum Note (Signed)
Addended by: Burney Gauze R on: 04/20/2015 07:45 AM   Modules accepted: Orders

## 2015-04-20 NOTE — Patient Instructions (Addendum)
Chest x-rays can be done yearly at this point here if you wish but definitely need to return for chest symptoms like cough/ wheeze/ short of breath/ pain on deep breathing

## 2015-04-20 NOTE — Progress Notes (Signed)
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    History of Present Illness  04/08/2012 1st pulmonary eval cc "stomach pain" > CT ABD c/w 5 mm nodule Lingula but no cough  has hb that resolved on prilosec then discomfort bilateral below umbilicus x 2 months variable severity, intermittent ?  assoc with mild constipation and some relief p bm. No pain with cough, no sob. rec The nodule on your lung is very small and probably cannot be seen on a plain cxr so the best way to follow this up in low risk situation like yours is to get a ct chest in May 2014 (we have you in our file for recall).  CT chest 04/10/14 Stable tiny bilateral pulmonary nodules, largest measuring 6 mm. Continued followup by CT recommended again in 12 months.    04/20/2015 f/u ov/Addalynn Kumari re:  MPNs/ asbestos exp Chief Complaint  Patient presents with  . Follow-up    feels breathing is doing well w/no concerns at this time.     Not limited by breathing from desired activities    No obvious day to day or daytime variabilty or assoc chronic cough or cp or chest tightness, subjective wheeze overt sinus or hb symptoms. No unusual exp hx or h/o childhood pna/ asthma or knowledge of premature birth.  Sleeping ok without nocturnal  or early am exacerbation  of respiratory  c/o's or need for noct saba. Also denies any obvious fluctuation of symptoms with weather or environmental changes or other aggravating or alleviating factors except as outlined above   Current Medications, Allergies, Complete Past Medical History, Past Surgical History, Family History, and Social History were reviewed in [employer] record.  ROS  The following are not active complaints unless bolded sore throat,  dysphagia, dental problems, itching, sneezing,  nasal congestion or excess/ purulent secretions, ear ache,   fever, chills, sweats, unintended wt loss, pleuritic or exertional cp, hemoptysis,  orthopnea pnd or leg swelling, presyncope, palpitations, heartburn, abdominal pain, anorexia, nausea, vomiting, diarrhea  or change in bowel or urinary habits, change in stools or urine, dysuria,hematuria,  rash, arthralgias, visual complaints, headache, numbness weakness or ataxia or problems with walking or coordination,  change in mood/affect or memory slowly deteriorating  per wife despite meds           Objective:   Physical Exam  amb pleasant wm nad  04/20/2015         151  Wt Readings from Last 3 Encounters:  04/08/12 167 lb (75.751 kg)  03/24/12 168 lb (76.204 kg)  02/19/12 169 lb (76.658 kg)    HEENT: nl dentition, turbinates, and orophanx. Nl external ear canals without cough reflex   NECK :  without JVD/Nodes/TM/ nl carotid upstrokes bilaterally   LUNGS: no acc muscle use, clear to A and P bilaterally without cough on insp or exp maneuvers   CV:  RRR  no s3 or murmur or increase in P2, no edema   ABD:  soft and nontender with nl excursion in the supine position. No bruits or organomegaly, bowel sounds nl  MS:  warm without deformities, calf tenderness, cyanosis or clubbing  SKIN: warm and dry without lesions    NEURO:  alert,  approp, no deficits     I personally reviewed images and agree with radiology impression as follows:  CXR:  04/20/15   COPD. No pulmonary parenchymal nodules are evident on this plain radiograph series.   My review:  also no asbestos related changes        Assessment:

## 2015-04-20 NOTE — Progress Notes (Signed)
Hematology and Oncology Follow Up Visit  Alexander Rice [id number] 05-22-1932 82 y.o. 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  Rice is back for followup.he looks okay. He says he feels okay. However, his hemoglobin is dropped area and he had a little bit of a response but now his hemoglobins go back down. He is not bleeding.  I think that we'll going to have to consider bone marrow for him.  I also think we might consider changing the Aranesp to Procrit. Sometimes, a change in the ESA I can help.  He had a good Memorial Day. He served in the [employer]. I talked to him about this. He is a well of knowledge regarding the [place] War.  He still is bothered by his arthritis. 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   Overall, his performance status is ECOG 2-3  Medications:  Current outpatient prescriptions:  .  aspirin 81 MG tablet, Take 81 mg by mouth daily.  , Disp: , Rfl:  .  CVS OMEGA-3 KRILL OIL PO, Take 350 mg by mouth daily., Disp: , Rfl:  .  donepezil (ARICEPT) 10 MG tablet, Take 1/2 tablet daily for 1 week, then increase to 1 tablet daily (Patient taking differently: Take 10 mg by mouth. ), Disp: 30 tablet, Rfl: 4 .  dorzolamide (TRUSOPT) 2 % ophthalmic solution, Place 1 drop into both eyes 2 (two) times daily.  , Disp: , Rfl:  .  doxazosin (CARDURA) 8 MG tablet, Take 1 tablet (8 mg total)  by mouth at bedtime., Disp: 90 tablet, Rfl: 2 .  isosorbide mononitrate (IMDUR) 30 MG 24 hr tablet, Take 1 tablet by mouth  (30mg ) daily, Disp: 90 tablet, Rfl: 2 .  LUMIGAN 0.01 % SOLN, Place 1 drop into both eyes  at bedtime. , Disp: , Rfl:  .  Multiple Vitamin (MULTIVITAMIN) tablet, Take 1 tablet by mouth daily.  , Disp: , Rfl:  .  nitroGLYCERIN (NITROSTAT) 0.4 MG SL tablet, Place 0.4 mg under the tongue every 5 (five) minutes as needed.  , Disp: , Rfl:  .  PRESCRIPTION MEDICATION, Supportive therapy [facility], Disp: , Rfl:  .  Probiotic Product (CVS PROBIOTIC) CHEW, Chew by mouth daily., Disp: , Rfl:  .  simvastatin (ZOCOR) 40 MG tablet, TAKE 1 TABLET BY MOUTH  DAILY AT 6 PM, Disp: 90 tablet, Rfl: 2 .  VESICARE 10 MG tablet, Take 10 mg by mouth daily. , Disp: , Rfl:  No current facility-administered medications for this visit.  Facility-Administered Medications Ordered in Other Visits:  .  darbepoetin (ARANESP) injection 300 mcg, 300 mcg, Subcutaneous, Once, [name], MD  Allergies: No Known Allergies  Past Medical History, Surgical history, Social history, and Family History were reviewed and updated.  Review of Systems: As above  Physical Exam:  weight is 152 lb (68.947 kg). His oral temperature is 97.9 F (36.6 C). His blood pressure is 122/31 and his pulse is 72. His respiration is 16.   Thin, elderly white gentleman in no obvious distress. Head and  neck exam shows no ocular or oral lesions. He has no adenopathy in the neck.. There's kyphosis of the spine. His lungs are clear bilaterally.. Cardiac exam regular rate and rhythm. He has a 1/ 6 systolic murmur. Abdomen is soft. He has good bowel sounds. There is no palpable liver or spleen tip. Back exam shows moderate kyphosis. He has some tenderness in the lower thoracic lumbar spine. Extremities shows age-related changes in his joints. He has good strength. His mild edema in his ankles.. Skin exam no rashes, ecchymoses or petechia.. Neurological exam is nonfocal.  Lab Results  Component Value Date   WBC 3.5* 04/19/2015   HGB 7.6* 04/19/2015   HCT 21.2* 04/19/2015   MCV 101* 04/19/2015   PLT 345 04/19/2015     Chemistry       Component Value Date/Time   NA 128* 03/31/2015 1605   NA 130 03/22/2015 0841   K 4.5 03/31/2015 1605   K 4.6 03/22/2015 0841   CL 99 03/31/2015 1605   CL 96* 03/22/2015 0841   CO2 22 03/31/2015 1605   CO2 25 03/22/2015 0841   BUN 18 03/31/2015 1605   BUN 20 03/22/2015 0841   CREATININE 1.51* 03/31/2015 1605   CREATININE 1.3* 03/22/2015 0841      Component Value Date/Time   CALCIUM 8.8 03/31/2015 1605   CALCIUM 8.9 03/22/2015 0841   ALKPHOS 42 03/23/2015 0410   ALKPHOS 48 03/22/2015 0841   AST 21 03/23/2015 0410   AST 23 03/22/2015 0841   ALT 16* 03/23/2015 0410   ALT 18 03/22/2015 0841   BILITOT 1.2 03/23/2015 0410   BILITOT 1.20 03/22/2015 0841        Impression and Plan: Alexander Rice is 81 year old gentleman with low-grade myelodysplasia. He has refractory anemia. He also has pernicious anemia and anemia of renal insufficiency.  I just am puzzled as to why his hemoglobin is not responding. I know that some patients can develop antibodies against Aranesp. We will try him on Procrit to see her back in help.  I think that we probably will have to consider doing a bone marrow test on him. I want to try to get this done next week. I think this is important so we can make sure nothing else is going on.  I talked to he and his daughter. I explained why I thought a bone marrow test could help. They agree.   I want to see him back in 3 weeks  [name], MD 6/1/20167:40 AM

## 2015-04-21 ENCOUNTER — Telehealth: Payer: Self-pay | Admitting: Internal Medicine

## 2015-04-21 DIAGNOSIS — G300 Alzheimer's disease with early onset: Secondary | ICD-10-CM | POA: Diagnosis not present

## 2015-04-21 DIAGNOSIS — F0280 Dementia in other diseases classified elsewhere without behavioral disturbance: Secondary | ICD-10-CM | POA: Diagnosis not present

## 2015-04-21 DIAGNOSIS — R531 Weakness: Secondary | ICD-10-CM | POA: Diagnosis not present

## 2015-04-21 DIAGNOSIS — M40209 Unspecified kyphosis, site unspecified: Secondary | ICD-10-CM | POA: Diagnosis not present

## 2015-04-21 NOTE — Progress Notes (Signed)
Quick Note:  LMTCB ______ 

## 2015-04-21 NOTE — Telephone Encounter (Signed)
Pt informed of CXR results. Nothing further needed.

## 2015-04-24 ENCOUNTER — Encounter: Payer: Self-pay | Admitting: Internal Medicine

## 2015-04-24 NOTE — Assessment & Plan Note (Signed)
-   first noted May 2013 with min smoking hx but Pos asbetos exp - nl cxr 04/20/15  > placed in tickle file for f/u 04/19/2016 with cxr    I had an extended final summary discussion with the patient and wife  reviewing all relevant studies completed to date and  lasting 15 to 20 minutes of a 25 minute visit on the following issues:    1) Although there are clearly abnormalities on CT scan, they should probably be considered "microscopic" since not obvious on plain cxr .     In the setting of obvious "macroscopic" health issues especially cognitive decline in this case,   I am very reluctatnt to embark on an invasive w/u at this point but will arrange consevative  follow up    Discussed in detail all the  indications, usual  risks and alternatives  relative to the benefits with patient/wife  who agree  to yearly cxr/s unless resp  symptoms develop in meantime justifying more aggressive approach

## 2015-04-25 DIAGNOSIS — F0280 Dementia in other diseases classified elsewhere without behavioral disturbance: Secondary | ICD-10-CM | POA: Diagnosis not present

## 2015-04-25 DIAGNOSIS — M40209 Unspecified kyphosis, site unspecified: Secondary | ICD-10-CM | POA: Diagnosis not present

## 2015-04-25 DIAGNOSIS — R531 Weakness: Secondary | ICD-10-CM | POA: Diagnosis not present

## 2015-04-25 DIAGNOSIS — G300 Alzheimer's disease with early onset: Secondary | ICD-10-CM | POA: Diagnosis not present

## 2015-04-26 ENCOUNTER — Other Ambulatory Visit (HOSPITAL_BASED_OUTPATIENT_CLINIC_OR_DEPARTMENT_OTHER): Payer: Medicare Other

## 2015-04-26 ENCOUNTER — Other Ambulatory Visit (HOSPITAL_COMMUNITY)
Admission: RE | Admit: 2015-04-26 | Discharge: 2015-04-26 | Disposition: A | Discharge status: Home / Self Care | Payer: Medicare Other | Source: Ambulatory Visit | Attending: Hematology & Oncology | Admitting: Hematology & Oncology

## 2015-04-26 ENCOUNTER — Ambulatory Visit (HOSPITAL_BASED_OUTPATIENT_CLINIC_OR_DEPARTMENT_OTHER): Payer: Medicare Other | Admitting: Hematology & Oncology

## 2015-04-26 VITALS — BP 141/64 | HR 60 | Temp 98.3°F | Resp 18 | Ht 67.0 in | Wt 150.0 lb

## 2015-04-26 DIAGNOSIS — D469 Myelodysplastic syndrome, unspecified: Secondary | ICD-10-CM | POA: Insufficient documentation

## 2015-04-26 DIAGNOSIS — D511 Vitamin B12 deficiency anemia due to selective vitamin B12 malabsorption with proteinuria: Secondary | ICD-10-CM | POA: Diagnosis not present

## 2015-04-26 DIAGNOSIS — D518 Other vitamin B12 deficiency anemias: Secondary | ICD-10-CM | POA: Diagnosis not present

## 2015-04-26 DIAGNOSIS — D72819 Decreased white blood cell count, unspecified: Secondary | ICD-10-CM | POA: Diagnosis not present

## 2015-04-26 DIAGNOSIS — D464 Refractory anemia, unspecified: Secondary | ICD-10-CM

## 2015-04-26 LAB — CBC WITH DIFFERENTIAL (CANCER CENTER ONLY)
BASO#: 0 10*3/uL (ref 0.0–0.2)
BASO%: 0.3 % (ref 0.0–2.0)
EOS ABS: 0.2 10*3/uL (ref 0.0–0.5)
EOS%: 4.6 % (ref 0.0–7.0)
HEMATOCRIT: 23.2 % — AB (ref 38.7–49.9)
HGB: 8.2 g/dL — ABNORMAL LOW (ref 13.0–17.1)
LYMPH#: 1.1 10*3/uL (ref 0.9–3.3)
LYMPH%: 29 % (ref 14.0–48.0)
MCH: 36.3 pg — AB (ref 28.0–33.4)
MCHC: 35.3 g/dL (ref 32.0–35.9)
MCV: 103 fL — AB (ref 82–98)
MONO#: 0.3 10*3/uL (ref 0.1–0.9)
MONO%: 7.7 % (ref 0.0–13.0)
NEUT%: 58.4 % (ref 40.0–80.0)
NEUTROS ABS: 2.1 10*3/uL (ref 1.5–6.5)
Platelets: 536 10*3/uL — ABNORMAL HIGH (ref 145–400)
RBC: 2.26 10*6/uL — AB (ref 4.20–5.70)
RDW: 16.9 % — ABNORMAL HIGH (ref 11.1–15.7)
WBC: 3.7 10*3/uL — AB (ref 4.0–10.0)

## 2015-04-26 LAB — RETICULOCYTES (CHCC)
ABS RETIC: 30.2 10*3/uL (ref 19.0–186.0)
RBC.: 2.32 MIL/uL — ABNORMAL LOW (ref 4.22–5.81)
Retic Ct Pct: 1.3 % (ref 0.4–2.3)

## 2015-04-26 LAB — BONE MARROW EXAM

## 2015-04-26 LAB — CHCC SATELLITE - SMEAR

## 2015-04-26 NOTE — Progress Notes (Signed)
This is a procedure note for Alexander Rice. We had him come to the treatment room to do a bone marrow biopsy and aspirate.  He signed the consent form.  We did the timeout procedure at 805 AM.  We placed him onto his right side. He did not require any sedation. The left posteriorly crest was prepped and draped in sterile fashion. 5 mL of 1% lidocaine was admitted with and under the skin down to the periosteum.  We used a scalpel to make an incision into the skin.  We use the combination biopsy and aspirate needle to obtain 2 aspirates. This was done without difficulty. We then obtained an excellent bone marrow biopsy core. He tolerated the procedure well. We cleaned and dressed the procedure site sterilely.  There were no complications.  His hemoglobin was 8.2. We did a bone marrow to see if he had any progression of myelodysplasia.

## 2015-04-26 NOTE — Progress Notes (Signed)
Patient in center today for Bone Marrow Biopsy accompanied by his daughter, consent form explained and signed by patient. Procedure performed by Dr. Marin Olp, after approtiate time out was done. Vital signs pre and post procedure completed and doccumentated. Patient remained in center for 30 minutes after procedure and nourishment given to patient, he was D/C with his daughter and advised to call this center if any questions or problems. Roxan Diesel LPN.

## 2015-04-28 DIAGNOSIS — F0280 Dementia in other diseases classified elsewhere without behavioral disturbance: Secondary | ICD-10-CM | POA: Diagnosis not present

## 2015-04-28 DIAGNOSIS — M40209 Unspecified kyphosis, site unspecified: Secondary | ICD-10-CM | POA: Diagnosis not present

## 2015-04-28 DIAGNOSIS — G300 Alzheimer's disease with early onset: Secondary | ICD-10-CM | POA: Diagnosis not present

## 2015-04-28 DIAGNOSIS — R531 Weakness: Secondary | ICD-10-CM | POA: Diagnosis not present

## 2015-05-05 LAB — TISSUE HYBRIDIZATION (BONE MARROW)-NCBH

## 2015-05-05 LAB — CHROMOSOME ANALYSIS, BONE MARROW

## 2015-05-06 ENCOUNTER — Ambulatory Visit (INDEPENDENT_AMBULATORY_CARE_PROVIDER_SITE_OTHER): Payer: Medicare Other | Admitting: Podiatry

## 2015-05-06 ENCOUNTER — Encounter: Payer: Self-pay | Admitting: Podiatry

## 2015-05-06 VITALS — BP 117/77 | HR 112

## 2015-05-06 DIAGNOSIS — I739 Peripheral vascular disease, unspecified: Secondary | ICD-10-CM

## 2015-05-06 DIAGNOSIS — M79674 Pain in right toe(s): Secondary | ICD-10-CM

## 2015-05-06 DIAGNOSIS — B351 Tinea unguium: Secondary | ICD-10-CM | POA: Diagnosis not present

## 2015-05-06 DIAGNOSIS — M79676 Pain in unspecified toe(s): Secondary | ICD-10-CM

## 2015-05-06 DIAGNOSIS — M79675 Pain in left toe(s): Secondary | ICD-10-CM

## 2015-05-07 NOTE — Progress Notes (Signed)
Patient ID: Alexander Rice, male   DOB: 1932/08/04, 81 y.o.   MRN: [id number] HPI  Complaint:  Visit Type: Patient returns to my office for continued preventative foot care services. Complaint: Patient states" my nails have grown long and thick and become painful to walk and wear shoes. He presents for preventative foot care services. No changes to ROS  Podiatric Exam: Vascular: dorsalis pedis and posterior tibial pulses are negative. Capillary return is diminished.. Temperature gradient is negative. Skin turgor WNL,   Sensorium: Normal Semmes Weinstein monofilament test. Normal tactile sensation bilaterally.  Nail Exam: Pt has thick disfigured discolored nails with subungual debris noted bilateral entire nail hallux  Ulcer Exam: There is no evidence of ulcer or pre-ulcerative changes or infection. Orthopedic Exam: Muscle tone and strength are WNL. No limitations in general ROM. No crepitus or effusions noted. Foot type and digits show no abnormalities. Bony prominences are unremarkable. Skin: No Porokeratosis. No infection or ulcers  Diagnosis:  Tinea unguium, Pain in right toe, pain in left toes  Treatment & Plan Procedures and Treatment: Consent by patient was obtained for treatment procedures. The patient understood the discussion of treatment and procedures well. All questions were answered thoroughly reviewed. Debridement of mycotic and hypertrophic toenails, 1 through 5 bilateral and clearing of subungual debris. No ulceration, no infection noted.  Return Visit-Office Procedure: Patient instructed to return to the office for a follow up visit 3 months for continued evaluation and treatment.

## 2015-05-17 ENCOUNTER — Ambulatory Visit (HOSPITAL_BASED_OUTPATIENT_CLINIC_OR_DEPARTMENT_OTHER): Payer: Medicare Other

## 2015-05-17 ENCOUNTER — Other Ambulatory Visit (HOSPITAL_BASED_OUTPATIENT_CLINIC_OR_DEPARTMENT_OTHER): Payer: Medicare Other

## 2015-05-17 ENCOUNTER — Encounter: Payer: Self-pay | Admitting: Hematology & Oncology

## 2015-05-17 ENCOUNTER — Ambulatory Visit (HOSPITAL_BASED_OUTPATIENT_CLINIC_OR_DEPARTMENT_OTHER): Payer: Medicare Other | Admitting: Hematology & Oncology

## 2015-05-17 VITALS — BP 138/89 | HR 63 | Temp 97.6°F | Resp 20 | Ht 67.0 in | Wt 157.0 lb

## 2015-05-17 DIAGNOSIS — D469 Myelodysplastic syndrome, unspecified: Secondary | ICD-10-CM | POA: Diagnosis not present

## 2015-05-17 DIAGNOSIS — D464 Refractory anemia, unspecified: Secondary | ICD-10-CM | POA: Diagnosis present

## 2015-05-17 DIAGNOSIS — D631 Anemia in chronic kidney disease: Secondary | ICD-10-CM

## 2015-05-17 DIAGNOSIS — N189 Chronic kidney disease, unspecified: Principal | ICD-10-CM

## 2015-05-17 DIAGNOSIS — N183 Chronic kidney disease, stage 3 (moderate): Secondary | ICD-10-CM | POA: Diagnosis present

## 2015-05-17 LAB — CMP (CANCER CENTER ONLY)
ALBUMIN: 3.4 g/dL (ref 3.3–5.5)
ALT(SGPT): 25 U/L (ref 10–47)
AST: 26 U/L (ref 11–38)
Alkaline Phosphatase: 43 U/L (ref 26–84)
BILIRUBIN TOTAL: 1.1 mg/dL (ref 0.20–1.60)
BUN, Bld: 11 mg/dL (ref 7–22)
CO2: 27 mEq/L (ref 18–33)
CREATININE: 1 mg/dL (ref 0.6–1.2)
Calcium: 8.8 mg/dL (ref 8.0–10.3)
Chloride: 103 mEq/L (ref 98–108)
Glucose, Bld: 97 mg/dL (ref 73–118)
Potassium: 4.5 mEq/L (ref 3.3–4.7)
Sodium: 134 mEq/L (ref 128–145)
Total Protein: 5.5 g/dL — ABNORMAL LOW (ref 6.4–8.1)

## 2015-05-17 LAB — CBC WITH DIFFERENTIAL (CANCER CENTER ONLY)
BASO#: 0 10*3/uL (ref 0.0–0.2)
BASO%: 0.8 % (ref 0.0–2.0)
EOS%: 2.9 % (ref 0.0–7.0)
Eosinophils Absolute: 0.1 10*3/uL (ref 0.0–0.5)
HCT: 23.2 % — ABNORMAL LOW (ref 38.7–49.9)
HEMOGLOBIN: 8 g/dL — AB (ref 13.0–17.1)
LYMPH#: 1.1 10*3/uL (ref 0.9–3.3)
LYMPH%: 28.6 % (ref 14.0–48.0)
MCH: 35.6 pg — ABNORMAL HIGH (ref 28.0–33.4)
MCHC: 34.5 g/dL (ref 32.0–35.9)
MCV: 103 fL — AB (ref 82–98)
MONO#: 0.3 10*3/uL (ref 0.1–0.9)
MONO%: 7.8 % (ref 0.0–13.0)
NEUT#: 2.3 10*3/uL (ref 1.5–6.5)
NEUT%: 59.9 % (ref 40.0–80.0)
Platelets: 432 10*3/uL — ABNORMAL HIGH (ref 145–400)
RBC: 2.25 10*6/uL — ABNORMAL LOW (ref 4.20–5.70)
RDW: 17.1 % — ABNORMAL HIGH (ref 11.1–15.7)
WBC: 3.9 10*3/uL — ABNORMAL LOW (ref 4.0–10.0)

## 2015-05-17 LAB — FERRITIN CHCC: Ferritin: 1042 ng/ml — ABNORMAL HIGH (ref 22–316)

## 2015-05-17 LAB — IRON AND TIBC CHCC
%SAT: 81 % — ABNORMAL HIGH (ref 20–55)
Iron: 145 ug/dL (ref 42–163)
TIBC: 179 ug/dL — ABNORMAL LOW (ref 202–409)
UIBC: 34 ug/dL — AB (ref 117–376)

## 2015-05-17 MED ORDER — VITAMIN B-6 250 MG PO TABS: 250.0000 mg | ORAL_TABLET | Freq: Every day | ORAL | Status: AC

## 2015-05-17 MED ORDER — EPOETIN ALFA 40000 UNIT/ML IJ SOLN
40000.0000 [IU] | Freq: Once | INTRAMUSCULAR | Status: AC
  Administered 2015-05-17: 40000 [IU] via SUBCUTANEOUS

## 2015-05-17 MED ORDER — EPOETIN ALFA 40000 UNIT/ML IJ SOLN
INTRAMUSCULAR | Status: AC
  Filled 2015-05-17: qty 1

## 2015-05-17 NOTE — Patient Instructions (Signed)

## 2015-05-17 NOTE — Progress Notes (Signed)
Hematology and Oncology Follow Up Visit  Alexander Rice [id number] [birth year] 81 y.o. 05/17/2015   Principle Diagnosis:   Refractory anemia with ringed sideroblasts.  Pernicious anemia  Severe osteoarthritis  Current Therapy:    Procrit 40,000 units subcutaneous every 2 weeks  Vitamin B6 250 mg by mouth daily  Vitamin B-12 1 mg IM every month-done at home     Interim History:  Alexander Rice is back for follow-up. We did go ahead and do a bone marrow biopsy on him. I did this to see if there is any progression of his myelodysplasia to the possibility of acute leukemia. He really was not responding well to treatment. The bone marrow biopsy was done on June 7. The pathology report ([id number]) shows a hypercellular marrow with dyspoietic changes. He had ringed sideroblasts.  The flow cytology did not show any increased blasts.  The cytogenetics did not reveal any abnormalities.  As such, I think we will make a couple changes with his protocol. I will try him on Procrit every 2 weeks. I will also start him on vitamin B 6.  He actually looks pretty good. His "color" looks good area  He's had no problems with nausea or vomiting.  His chronic back pain has not flared up.  Overall, his performance status is ECOG 2.  Medications:  Current outpatient prescriptions:  .  aspirin 81 MG tablet, Take 81 mg by mouth daily.  , Disp: , Rfl:  .  [facility] OMEGA-3 KRILL OIL PO, Take 350 mg by mouth daily., Disp: , Rfl:  .  donepezil (ARICEPT) 10 MG tablet, Take 1/2 tablet daily for 1 week, then increase to 1 tablet daily (Patient taking differently: Take 10 mg by mouth. ), Disp: 30 tablet, Rfl: 4 .  dorzolamide (TRUSOPT) 2 % ophthalmic solution, Place 1 drop into both eyes 2 (two) times daily.  , Disp: , Rfl:  .  doxazosin (CARDURA) 8 MG tablet, Take 1 tablet (8 mg total)  by mouth at bedtime., Disp: 90 tablet, Rfl: 2 .  isosorbide mononitrate (IMDUR) 30 MG 24 hr tablet, Take 1 tablet by mouth   (22m) daily, Disp: 90 tablet, Rfl: 2 .  LUMIGAN 0.01 % SOLN, Place 1 drop into both eyes at bedtime. , Disp: , Rfl:  .  Multiple Vitamin (MULTIVITAMIN) tablet, Take 1 tablet by mouth daily.  , Disp: , Rfl:  .  nitroGLYCERIN (NITROSTAT) 0.4 MG SL tablet, Place 0.4 mg under the tongue every 5 (five) minutes as needed.  , Disp: , Rfl:  .  PRESCRIPTION MEDICATION, Supportive therapy [facility], Disp: , Rfl:  .  Probiotic Product ([facility]S PROBIOTIC) CHEW, Chew by mouth daily., Disp: , Rfl:  .  simvastatin (ZOCOR) 40 MG tablet, TAKE 1 TABLET BY MOUTH  DAILY AT 6 PM, Disp: 90 tablet, Rfl: 2 .  VESICARE 10 MG tablet, Take 10 mg by mouth daily. , Disp: , Rfl:  .  Pyridoxine HCl (VITAMIN B-6) 250 MG tablet, Take 1 tablet (250 mg total) by mouth daily., Disp: 90 tablet, Rfl: 3 No current facility-administered medications for this visit.  Facility-Administered Medications Ordered in Other Visits:  .  darbepoetin (ARANESP) injection 300 mcg, 300 mcg, Subcutaneous, Once, PVolanda Napoleon MD  Allergies: No Known Allergies  Past Medical History, Surgical history, Social history, and Family History were reviewed and updated.  Review of Systems: As above  Physical Exam:  height is 5' 7"  (1.702 m) and weight is 157 lb (71.215 kg). His oral  temperature is 97.6 F (36.4 C). His blood pressure is 138/89 and his pulse is 63. His respiration is 20.   Wt Readings from Last 3 Encounters:  05/17/15 157 lb (71.215 kg)  04/26/15 150 lb (68.04 kg)  04/20/15 151 lb (68.493 kg)     Elderly white gentleman in no obvious distress. Head and neck exam shows no ocular or oral lesions. There are no palpable cervical or supraclavicular lymph nodes. Lungs are clear. Cardiac exam regular rate and rhythm with no murmurs, rubs or bruits. Abdomen is soft. He has good bowel sounds. There is no fluid wave. There is no palpable liver or spleen tip. Back exam shows some kyphosis. Extremities shows some age-related osteoarthritic  changes. He has decent range of motion of his joints. Skin exam shows no rashes, ecchymoses or petechia.  Lab Results  Component Value Date   WBC 3.9* 05/17/2015   HGB 8.0* 05/17/2015   HCT 23.2* 05/17/2015   MCV 103* 05/17/2015   PLT 432* 05/17/2015     Chemistry      Component Value Date/Time   NA 134 05/17/2015 0936   NA 128* 03/31/2015 1605   K 4.5 05/17/2015 0936   K 4.5 03/31/2015 1605   CL 103 05/17/2015 0936   CL 99 03/31/2015 1605   CO2 27 05/17/2015 0936   CO2 22 03/31/2015 1605   BUN 11 05/17/2015 0936   BUN 18 03/31/2015 1605   CREATININE 1.0 05/17/2015 0936   CREATININE 1.51* 03/31/2015 1605      Component Value Date/Time   CALCIUM 8.8 05/17/2015 0936   CALCIUM 8.8 03/31/2015 1605   ALKPHOS 43 05/17/2015 0936   ALKPHOS 42 03/23/2015 0410   AST 26 05/17/2015 0936   AST 21 03/23/2015 0410   ALT 25 05/17/2015 0936   ALT 16* 03/23/2015 0410   BILITOT 1.10 05/17/2015 0936   BILITOT 1.2 03/23/2015 0410         Impression and Plan: Alexander Rice is 81 year old gentleman with myelodysplasia. He has refractory anemia with ringed sideroblasts.  Again, we will see if an increased Procrit frequency might help. His last erythropoietin level back in September was only 31 so we hopefully can see a better response with an increased frequency of Procrit.  Hopefully, the vitamin B6 will help.  I spent about 40 minutes with he and his daughter. I went over the bone marrow report.  I told him that he will live with this problem but not die from it. I just have not seen any adverse risk factors as of yet.    I don't think that we have to send off his blood for gene sequencing. I will see him back in 6 weeks. He'll come back every 2 weeks for a CBC and Procrit.    [name], MD 6/28/201611:16 AM

## 2015-05-19 ENCOUNTER — Ambulatory Visit (INDEPENDENT_AMBULATORY_CARE_PROVIDER_SITE_OTHER): Payer: Medicare Other | Admitting: Family Medicine

## 2015-05-19 DIAGNOSIS — E538 Deficiency of other specified B group vitamins: Secondary | ICD-10-CM | POA: Diagnosis not present

## 2015-05-19 MED ORDER — CYANOCOBALAMIN 1000 MCG/ML IJ SOLN
1000.0000 ug | Freq: Once | INTRAMUSCULAR | Status: AC
  Administered 2015-05-19: 1000 ug via INTRAMUSCULAR

## 2015-05-26 ENCOUNTER — Encounter (HOSPITAL_COMMUNITY): Payer: Self-pay

## 2015-05-30 ENCOUNTER — Other Ambulatory Visit: Payer: Self-pay | Admitting: *Deleted

## 2015-05-30 DIAGNOSIS — D469 Myelodysplastic syndrome, unspecified: Secondary | ICD-10-CM

## 2015-05-31 ENCOUNTER — Other Ambulatory Visit (HOSPITAL_BASED_OUTPATIENT_CLINIC_OR_DEPARTMENT_OTHER): Payer: Medicare Other

## 2015-05-31 ENCOUNTER — Ambulatory Visit (HOSPITAL_BASED_OUTPATIENT_CLINIC_OR_DEPARTMENT_OTHER): Payer: Medicare Other

## 2015-05-31 VITALS — BP 140/48 | HR 52 | Temp 98.2°F | Resp 18

## 2015-05-31 DIAGNOSIS — D649 Anemia, unspecified: Secondary | ICD-10-CM

## 2015-05-31 DIAGNOSIS — D464 Refractory anemia, unspecified: Secondary | ICD-10-CM | POA: Diagnosis present

## 2015-05-31 DIAGNOSIS — D469 Myelodysplastic syndrome, unspecified: Secondary | ICD-10-CM

## 2015-05-31 LAB — CBC WITH DIFFERENTIAL (CANCER CENTER ONLY)
BASO#: 0 10*3/uL (ref 0.0–0.2)
BASO%: 0.3 % (ref 0.0–2.0)
EOS%: 2.4 % (ref 0.0–7.0)
Eosinophils Absolute: 0.1 10*3/uL (ref 0.0–0.5)
HCT: 23.1 % — ABNORMAL LOW (ref 38.7–49.9)
HGB: 8 g/dL — ABNORMAL LOW (ref 13.0–17.1)
LYMPH#: 1.1 10*3/uL (ref 0.9–3.3)
LYMPH%: 32.4 % (ref 14.0–48.0)
MCH: 35.9 pg — ABNORMAL HIGH (ref 28.0–33.4)
MCHC: 34.6 g/dL (ref 32.0–35.9)
MCV: 104 fL — ABNORMAL HIGH (ref 82–98)
MONO#: 0.3 10*3/uL (ref 0.1–0.9)
MONO%: 9.7 % (ref 0.0–13.0)
NEUT%: 55.2 % (ref 40.0–80.0)
NEUTROS ABS: 1.9 10*3/uL (ref 1.5–6.5)
Platelets: 328 10*3/uL (ref 145–400)
RBC: 2.23 10*6/uL — AB (ref 4.20–5.70)
RDW: 17.8 % — AB (ref 11.1–15.7)
WBC: 3.4 10*3/uL — ABNORMAL LOW (ref 4.0–10.0)

## 2015-05-31 MED ORDER — EPOETIN ALFA 40000 UNIT/ML IJ SOLN
40000.0000 [IU] | Freq: Once | INTRAMUSCULAR | Status: AC
  Administered 2015-05-31: 40000 [IU] via SUBCUTANEOUS

## 2015-05-31 MED ORDER — EPOETIN ALFA 40000 UNIT/ML IJ SOLN
INTRAMUSCULAR | Status: AC
  Filled 2015-05-31: qty 1

## 2015-05-31 NOTE — Patient Instructions (Signed)

## 2015-06-10 ENCOUNTER — Encounter: Payer: Self-pay | Admitting: Neurology

## 2015-06-10 ENCOUNTER — Ambulatory Visit (INDEPENDENT_AMBULATORY_CARE_PROVIDER_SITE_OTHER): Payer: Medicare Other | Admitting: Neurology

## 2015-06-10 VITALS — BP 110/66 | HR 68 | Resp 16 | Wt 152.0 lb

## 2015-06-10 DIAGNOSIS — R413 Other amnesia: Secondary | ICD-10-CM | POA: Diagnosis not present

## 2015-06-10 DIAGNOSIS — I2510 Atherosclerotic heart disease of native coronary artery without angina pectoris: Secondary | ICD-10-CM

## 2015-06-10 DIAGNOSIS — F0390 Unspecified dementia without behavioral disturbance: Secondary | ICD-10-CM | POA: Diagnosis not present

## 2015-06-10 DIAGNOSIS — F03A0 Unspecified dementia, mild, without behavioral disturbance, psychotic disturbance, mood disturbance, and anxiety: Secondary | ICD-10-CM | POA: Insufficient documentation

## 2015-06-10 MED ORDER — DONEPEZIL HCL 10 MG PO TABS: ORAL_TABLET | ORAL | Status: DC

## 2015-06-10 NOTE — Patient Instructions (Signed)
1. Continue Aricept 10mg  daily 2. Physical exercise and brain stimulation exercises are important for brain health 3. Follow-up in 1 year, call for any problems

## 2015-06-10 NOTE — Progress Notes (Signed)
NEUROLOGY FOLLOW UP OFFICE NOTE  Alexander Rice [id number]  HISTORY OF PRESENT ILLNESS: I had the pleasure of seeing Alexander Rice in follow-up in the neurology clinic on 06/10/2015.  The patient was last seen 3 months ago for worsening memory. MMSE in April 2016 was 21/30, indicating mild dementia. He is again accompanied by his daughter who helps supplement the history today.  Records and images were personally reviewed where available.  I personally reviewed MRI brain without contrast which did not show any acute changes. There was moderate diffuse atrophy and mild chronic microvascular disease. He was started on Aricept 10mg  daily, which he is tolerating without side effects. He feels his memory is "pretty good." His daughter denies any significant changes. She puts his pills in his pillbox and he is pretty good with remembering to take them. He has stopped driving and asks about returning to driving. He denies any headaches, dizziness, diplopia, dysarthria, dysphagia, neck pain, focal numbness/tingling/weakness, bowel/bladder dysfunction, anosmia, tremors. No falls.  HPI: This is an 81 yo RH man with a history of hypertension, hyperlipidemia, low-grade myelodysplasia, refractory anemia, pernicious anemia, with worsening memory loss. He feels that his memory is pretty good. He states he does a lot of work for the [employer] and denies having any difficulties with his duties there. He does note that he misplaces things and has had word-finding difficulties. He got lost driving 6 months ago but would not elaborate. He denied any missed bills or missed medications. His wife and daughter, on the other hand, report more concerning memory changes over the past couple of years. He would start in the middle of a conversation and she would have to ask what he is talking about. He has noticeable word-finding difficulties, and sometimes does not seem to understand conversations. His wife is  concerned about his driving, they got lost 6 months ago and were out of 3 hours. He sometimes drives/drifts to the median, or sometimes comes to a complete stop. He would get upset with her if she comments. He frequently misplaces things, his cell phone, credit card, one time he did not remember where he parked, and they reported the car stolen until he came back the next day and found the car. He repeats himself. He has had some problems with late bill payments. They deny any personality changes. No family history of memory loss, no significant head injuries. He drinks wine daily and beer occasionally, reporting that this is less than before.   PAST MEDICAL HISTORY: Past Medical History  Diagnosis Date  . CAD (coronary artery disease)   . GERD (gastroesophageal reflux disease)   . Hyperlipidemia   . Hypertension   . BPH (benign prostatic hyperplasia)   . CVD (cardiovascular disease)   . Adenomatous colon polyp 02/1996, 02/2011    TA polyp 02/2011  . B12 deficiency   . Anemia in chronic renal disease 11/02/2011  . Esophageal stricture   . Diverticulosis   . Hemorrhoids   . MDS (myelodysplastic syndrome)   . Glaucoma   . Heart palpitations   . Hiatal hernia   . History of colonic polyps 09/29/2006    Polyps age 52, Dr. Fuller Plan stated no further colonoscopy due to age. Confirmed with daughter given alzheimers, CAD history      MEDICATIONS: Current Outpatient Prescriptions on File Prior to Visit  Medication Sig Dispense Refill  . aspirin 81 MG tablet Take 81 mg by mouth daily.      [name] [facility]  OMEGA-3 KRILL OIL PO Take 350 mg by mouth daily.    [name] donepezil (ARICEPT) 10 MG tablet Take 1/2 tablet daily for 1 week, then increase to 1 tablet daily (Patient taking differently: Take 10 mg by mouth. ) 30 tablet 4  . dorzolamide (TRUSOPT) 2 % ophthalmic solution Place 1 drop into both eyes 2 (two) times daily.      [name] doxazosin (CARDURA) 8 MG tablet Take 1 tablet (8 mg total)  by mouth at bedtime. 90  tablet 2  . isosorbide mononitrate (IMDUR) 30 MG 24 hr tablet Take 1 tablet by mouth  (30mg ) daily 90 tablet 2  . LUMIGAN 0.01 % SOLN Place 1 drop into both eyes at bedtime.     . Multiple Vitamin (MULTIVITAMIN) tablet Take 1 tablet by mouth daily.      . Probiotic Product ([facility]S PROBIOTIC) CHEW Chew by mouth daily.    . Pyridoxine HCl (VITAMIN B-6) 250 MG tablet Take 1 tablet (250 mg total) by mouth daily. 90 tablet 3  . simvastatin (ZOCOR) 40 MG tablet TAKE 1 TABLET BY MOUTH  DAILY AT 6 PM 90 tablet 2  . VESICARE 10 MG tablet Take 10 mg by mouth daily.     . nitroGLYCERIN (NITROSTAT) 0.4 MG SL tablet Place 0.4 mg under the tongue every 5 (five) minutes as needed.       Current Facility-Administered Medications on File Prior to Visit  Medication Dose Route Frequency Provider Last Rate Last Dose  . darbepoetin (ARANESP) injection 300 mcg  300 mcg Subcutaneous Once [name], MD        ALLERGIES: No Known Allergies  FAMILY HISTORY: Family History  Problem Relation Age of Onset  . COPD Mother   . Alcohol abuse Father     SOCIAL HISTORY: History   Social History  . Marital Status: Married    Spouse Name: N/A  . Number of Children: 3  . Years of Education: N/A   Occupational History  . Retired    Social History Main Topics  . Smoking status: Former Smoker    Quit date: 11/19/1968  . Smokeless tobacco: Never Used     Comment: never used tobacco  . Alcohol Use: 4.2 oz/week    7 Standard drinks or equivalent per week     Comment: Wine/Beer  . Drug Use: No  . Sexual Activity: Not on file   Other Topics Concern  . Not on file   Social History Narrative   Married (wife patient of Dr. 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    REVIEW OF SYSTEMS: Constitutional: No fevers, chills, or sweats, no generalized fatigue, change in appetite Eyes: No visual changes, double vision, eye  pain Ear, nose and throat: No hearing loss, ear pain, nasal congestion, sore throat Cardiovascular: No chest pain, palpitations Respiratory:  No shortness of breath at rest or with exertion, wheezes GastrointestinaI: No nausea, vomiting, diarrhea, abdominal pain, fecal incontinence Genitourinary:  No dysuria, urinary retention or frequency Musculoskeletal:  No neck pain, +back pain Integumentary: No rash, pruritus, skin lesions Neurological: as above Psychiatric: No depression, insomnia, anxiety Endocrine: No palpitations, fatigue, diaphoresis, mood swings, change in appetite, change in weight, increased thirst Hematologic/Lymphatic:  No anemia, purpura, petechiae. Allergic/Immunologic: no itchy/runny eyes, nasal congestion, recent allergic reactions, rashes  PHYSICAL EXAM: Filed Vitals:   06/10/15 0934  BP: 110/66  Pulse:  68  Resp: 16   General: No acute distress Head:  Normocephalic/atraumatic Neck: supple, no paraspinal tenderness, full range of motion Heart:  Regular rate and rhythm Lungs:  Clear to auscultation bilaterally Back: No paraspinal tenderness Skin/Extremities: No rash, no edema Neurological Exam: alert and oriented to person, place, and time. He stated he has been married for 69 years, his daughter corrected him that it is 40 years. No aphasia or dysarthria. Fund of knowledge is appropriate.  Remote memory intact. 0/3 delayed recall.  Attention and concentration are normal.    Able to name objects and repeat phrases. Cranial nerves: Pupils equal, round, reactive to light.  Fundoscopic exam unremarkable, no papilledema. Extraocular movements intact with no nystagmus. Visual fields full. Facial sensation intact. No facial asymmetry. Tongue, uvula, palate midline.  Motor: Bulk and tone normal, muscle strength 5/5 throughout with no pronator drift.  Sensation to light touch intact.  No extinction to double simultaneous stimulation.  Deep tendon reflexes brisk 2+ throughout,  toes downgoing.  Finger to nose testing intact.  Gait slow and cautious, stooped posture, seems to favor the right leg.  IMPRESSION: This is an 81 yo RH man with a history of hypertension, hyperlipidemia, myelodysplasia, anemia, lumbar stenosis, with mild dementia. MMSE in April 2016 was 21/30. MRI brain unremarkable. He is tolerating Aricept 10mg  daily without side effects. We also discussed the importance of physical exercise and brain stimulation exercises for brain health.He will follow-up in 1 year and knows to call our office for any changes in the interim.  Thank you for allowing me to participate in his care.  Please do not hesitate to call for any questions or concerns.  The duration of this appointment visit was 14 minutes of face-to-face time with the patient.  Greater than 50% of this time was spent in counseling, explanation of diagnosis, planning of further management, and coordination of care.   Ellouise Newer, M.D.   CC: Dr. Yong Channel

## 2015-06-13 ENCOUNTER — Other Ambulatory Visit: Payer: Self-pay | Admitting: *Deleted

## 2015-06-13 DIAGNOSIS — D469 Myelodysplastic syndrome, unspecified: Secondary | ICD-10-CM

## 2015-06-13 DIAGNOSIS — D518 Other vitamin B12 deficiency anemias: Secondary | ICD-10-CM

## 2015-06-14 ENCOUNTER — Ambulatory Visit (HOSPITAL_BASED_OUTPATIENT_CLINIC_OR_DEPARTMENT_OTHER): Payer: Medicare Other

## 2015-06-14 ENCOUNTER — Other Ambulatory Visit (HOSPITAL_BASED_OUTPATIENT_CLINIC_OR_DEPARTMENT_OTHER): Payer: Medicare Other

## 2015-06-14 VITALS — BP 127/48 | HR 52 | Temp 98.2°F | Resp 18

## 2015-06-14 DIAGNOSIS — D469 Myelodysplastic syndrome, unspecified: Secondary | ICD-10-CM

## 2015-06-14 DIAGNOSIS — D461 Refractory anemia with ring sideroblasts: Secondary | ICD-10-CM

## 2015-06-14 DIAGNOSIS — D518 Other vitamin B12 deficiency anemias: Secondary | ICD-10-CM

## 2015-06-14 LAB — CBC WITH DIFFERENTIAL (CANCER CENTER ONLY)
BASO#: 0 10*3/uL (ref 0.0–0.2)
BASO%: 0.3 % (ref 0.0–2.0)
EOS%: 1.1 % (ref 0.0–7.0)
Eosinophils Absolute: 0 10*3/uL (ref 0.0–0.5)
HCT: 26.3 % — ABNORMAL LOW (ref 38.7–49.9)
HGB: 9.1 g/dL — ABNORMAL LOW (ref 13.0–17.1)
LYMPH#: 0.9 10*3/uL (ref 0.9–3.3)
LYMPH%: 24.8 % (ref 14.0–48.0)
MCH: 35 pg — AB (ref 28.0–33.4)
MCHC: 34.6 g/dL (ref 32.0–35.9)
MCV: 101 fL — ABNORMAL HIGH (ref 82–98)
MONO#: 0.4 10*3/uL (ref 0.1–0.9)
MONO%: 10.1 % (ref 0.0–13.0)
NEUT%: 63.7 % (ref 40.0–80.0)
NEUTROS ABS: 2.4 10*3/uL (ref 1.5–6.5)
Platelets: 380 10*3/uL (ref 145–400)
RBC: 2.6 10*6/uL — ABNORMAL LOW (ref 4.20–5.70)
RDW: 17.1 % — ABNORMAL HIGH (ref 11.1–15.7)
WBC: 3.8 10*3/uL — AB (ref 4.0–10.0)

## 2015-06-14 MED ORDER — EPOETIN ALFA 40000 UNIT/ML IJ SOLN
40000.0000 [IU] | Freq: Once | INTRAMUSCULAR | Status: AC
  Administered 2015-06-14: 40000 [IU] via SUBCUTANEOUS

## 2015-06-14 MED ORDER — EPOETIN ALFA 40000 UNIT/ML IJ SOLN
INTRAMUSCULAR | Status: AC
  Filled 2015-06-14: qty 1

## 2015-06-14 NOTE — Patient Instructions (Signed)

## 2015-06-17 DIAGNOSIS — H409 Unspecified glaucoma: Secondary | ICD-10-CM | POA: Diagnosis not present

## 2015-06-17 DIAGNOSIS — H02403 Unspecified ptosis of bilateral eyelids: Secondary | ICD-10-CM | POA: Diagnosis not present

## 2015-06-17 DIAGNOSIS — H4011X0 Primary open-angle glaucoma, stage unspecified: Secondary | ICD-10-CM | POA: Diagnosis not present

## 2015-06-17 DIAGNOSIS — H04123 Dry eye syndrome of bilateral lacrimal glands: Secondary | ICD-10-CM | POA: Diagnosis not present

## 2015-06-29 ENCOUNTER — Encounter: Payer: Self-pay | Admitting: Family

## 2015-06-29 ENCOUNTER — Other Ambulatory Visit: Payer: Self-pay | Admitting: Family

## 2015-06-29 ENCOUNTER — Other Ambulatory Visit (HOSPITAL_BASED_OUTPATIENT_CLINIC_OR_DEPARTMENT_OTHER): Payer: Medicare Other

## 2015-06-29 ENCOUNTER — Ambulatory Visit (HOSPITAL_BASED_OUTPATIENT_CLINIC_OR_DEPARTMENT_OTHER): Payer: Medicare Other | Admitting: Family

## 2015-06-29 ENCOUNTER — Ambulatory Visit (HOSPITAL_BASED_OUTPATIENT_CLINIC_OR_DEPARTMENT_OTHER): Payer: Medicare Other

## 2015-06-29 VITALS — BP 170/55 | HR 52 | Temp 97.3°F | Resp 16 | Ht 67.0 in | Wt 153.0 lb

## 2015-06-29 DIAGNOSIS — D631 Anemia in chronic kidney disease: Secondary | ICD-10-CM

## 2015-06-29 DIAGNOSIS — M545 Low back pain: Secondary | ICD-10-CM

## 2015-06-29 DIAGNOSIS — D460 Refractory anemia without ring sideroblasts, so stated: Secondary | ICD-10-CM | POA: Diagnosis not present

## 2015-06-29 DIAGNOSIS — D469 Myelodysplastic syndrome, unspecified: Secondary | ICD-10-CM

## 2015-06-29 DIAGNOSIS — D461 Refractory anemia with ring sideroblasts: Secondary | ICD-10-CM

## 2015-06-29 DIAGNOSIS — G8929 Other chronic pain: Secondary | ICD-10-CM | POA: Diagnosis not present

## 2015-06-29 DIAGNOSIS — N189 Chronic kidney disease, unspecified: Secondary | ICD-10-CM

## 2015-06-29 DIAGNOSIS — D510 Vitamin B12 deficiency anemia due to intrinsic factor deficiency: Secondary | ICD-10-CM

## 2015-06-29 LAB — COMPREHENSIVE METABOLIC PANEL
ALK PHOS: 40 U/L (ref 40–115)
ALT: 16 U/L (ref 9–46)
AST: 23 U/L (ref 10–35)
Albumin: 3.6 g/dL (ref 3.6–5.1)
BUN: 15 mg/dL (ref 7–25)
CO2: 21 mmol/L (ref 20–31)
CREATININE: 1.01 mg/dL (ref 0.70–1.11)
Calcium: 8.3 mg/dL — ABNORMAL LOW (ref 8.6–10.3)
Chloride: 95 mmol/L — ABNORMAL LOW (ref 98–110)
Glucose, Bld: 94 mg/dL (ref 65–99)
Potassium: 4.6 mmol/L (ref 3.5–5.3)
SODIUM: 123 mmol/L — AB (ref 135–146)
TOTAL PROTEIN: 5.6 g/dL — AB (ref 6.1–8.1)
Total Bilirubin: 0.7 mg/dL (ref 0.2–1.2)

## 2015-06-29 LAB — CBC WITH DIFFERENTIAL (CANCER CENTER ONLY)
BASO#: 0 10*3/uL (ref 0.0–0.2)
BASO%: 0.2 % (ref 0.0–2.0)
EOS ABS: 0.1 10*3/uL (ref 0.0–0.5)
EOS%: 1.4 % (ref 0.0–7.0)
HEMATOCRIT: 25 % — AB (ref 38.7–49.9)
HEMOGLOBIN: 8.7 g/dL — AB (ref 13.0–17.1)
LYMPH#: 1.5 10*3/uL (ref 0.9–3.3)
LYMPH%: 26.7 % (ref 14.0–48.0)
MCH: 34.5 pg — AB (ref 28.0–33.4)
MCHC: 34.8 g/dL (ref 32.0–35.9)
MCV: 99 fL — AB (ref 82–98)
MONO#: 0.5 10*3/uL (ref 0.1–0.9)
MONO%: 9 % (ref 0.0–13.0)
NEUT#: 3.5 10*3/uL (ref 1.5–6.5)
NEUT%: 62.7 % (ref 40.0–80.0)
Platelets: 309 10*3/uL (ref 145–400)
RBC: 2.52 10*6/uL — ABNORMAL LOW (ref 4.20–5.70)
RDW: 17.2 % — ABNORMAL HIGH (ref 11.1–15.7)
WBC: 5.6 10*3/uL (ref 4.0–10.0)

## 2015-06-29 LAB — RETICULOCYTES (CHCC)
ABS RETIC: 18.3 10*3/uL — AB (ref 19.0–186.0)
RBC.: 2.61 MIL/uL — ABNORMAL LOW (ref 4.22–5.81)
Retic Ct Pct: 0.7 % (ref 0.4–2.3)

## 2015-06-29 MED ORDER — EPOETIN ALFA 40000 UNIT/ML IJ SOLN
40000.0000 [IU] | Freq: Once | INTRAMUSCULAR | Status: AC
  Administered 2015-06-29: 40000 [IU] via SUBCUTANEOUS

## 2015-06-29 MED ORDER — EPOETIN ALFA 40000 UNIT/ML IJ SOLN
INTRAMUSCULAR | Status: AC
  Filled 2015-06-29: qty 1

## 2015-06-29 NOTE — Patient Instructions (Signed)

## 2015-06-29 NOTE — Progress Notes (Signed)
Hematology and Oncology Follow Up Visit  Alexander Rice [id number] [birth year] 82 y.o. 06/29/2015   Principle Diagnosis:  Anemia of renal insufficiency  Refractory anemia with ringed sideroblasts. Pernicious anemia  Current Therapy:   Procrit 40,000 units subcutaneous every 2 weeks Vitamin B6 250 mg by mouth daily Vitamin B-12 1 mg IM every month - at home    Interim History: Alexander Rice is here today with is daughter for a follow-up. He looks really great. He has color in his cheeks and is very talkative today. He is still helping with local golf tournaments and the [employer]. He really enjoys this. He denies fatigue.  His Hgb is holding at 8.7 with an MCV of 99. He has had no episodes of bleeding or bruising.  He is taking a B complex vitamin at home daily and his daughter gives him his B 12 injection monthly.  He has had no fever, chills, cough, rash, headache, SOB, chest pain, palpitations, abdominal pain, constipation, diarrhea, blood in urine or stool. No episodes of bleeding.  He denies swelling, tenderness, numbness or tingling in his extremities. He's had chronic back issues that bother him from time to time but no new aches or pains.  He is eating well and staying hydrated. His weight is stable and he has gained 1 lb since his last visit.   Medications:    Medication List       This list is accurate as of: 06/29/15  3:42 PM.  Always use your most recent med list.               aspirin 81 MG tablet  Take 81 mg by mouth daily.     CVS OMEGA-3 KRILL OIL PO  Take 350 mg by mouth daily.     [facility]S PROBIOTIC Chew  Chew by mouth daily.     donepezil 10 MG tablet  Commonly known as:  ARICEPT  Take 1 tablet daily     dorzolamide 2 % ophthalmic solution  Commonly known as:  TRUSOPT  Place 1 drop into both eyes 2 (two) times daily.     doxazosin 8 MG tablet  Commonly known as:  CARDURA  Take 1 tablet (8 mg total)  by mouth at bedtime.     isosorbide  mononitrate 30 MG 24 hr tablet  Commonly known as:  IMDUR  Take 1 tablet by mouth  (30mg ) daily     LUMIGAN 0.01 % Soln  Generic drug:  bimatoprost  Place 1 drop into both eyes at bedtime.     multivitamin tablet  Take 1 tablet by mouth daily.     nitroGLYCERIN 0.4 MG SL tablet  Commonly known as:  NITROSTAT  Place 0.4 mg under the tongue every 5 (five) minutes as needed.     omeprazole 20 MG capsule  Commonly known as:  PRILOSEC     simvastatin 40 MG tablet  Commonly known as:  ZOCOR  TAKE 1 TABLET BY MOUTH  DAILY AT 6 PM     VESICARE 10 MG tablet  Generic drug:  solifenacin  Take 10 mg by mouth daily.     vitamin B-6 250 MG tablet  Take 1 tablet (250 mg total) by mouth daily.        Allergies: No Known Allergies  Past Medical History, Surgical history, Social history, and Family History were reviewed and updated.  Review of Systems: All other 10 point review of systems is negative.   Physical Exam:  height is 5\' 7"  (1.702 m) and weight is 153 lb (69.4 kg). His oral temperature is 97.3 F (36.3 C). His blood pressure is 170/55 and his pulse is 52. His respiration is 16.   Wt Readings from Last 3 Encounters:  06/29/15 153 lb (69.4 kg)  06/10/15 152 lb (68.947 kg)  05/17/15 157 lb (71.215 kg)    Ocular: Sclerae unicteric, pupils equal, round and reactive to light Ear-nose-throat: Oropharynx clear, dentition fair Lymphatic: No cervical or supraclavicular adenopathy Lungs no rales or rhonchi, good excursion bilaterally Heart regular rate and rhythm, no murmur appreciated Abd soft, nontender, positive bowel sounds MSK no focal spinal tenderness, no joint edema Neuro: non-focal, well-oriented, appropriate affect  Lab Results  Component Value Date   WBC 5.6 06/29/2015   HGB 8.7* 06/29/2015   HCT 25.0* 06/29/2015   MCV 99* 06/29/2015   PLT 309 06/29/2015   Lab Results  Component Value Date   FERRITIN 1,042* 05/17/2015   IRON 145 05/17/2015   TIBC 179*  05/17/2015   UIBC 34* 05/17/2015   IRONPCTSAT 81* 05/17/2015   Lab Results  Component Value Date   RETICCTPCT 1.3 04/26/2015   RBC 2.52* 06/29/2015   RETICCTABS 30.2 04/26/2015   Lab Results  Component Value Date   KPAFRELGTCHN 3.09* 04/22/2008   LAMBDASER 1.01 04/22/2008   KAPLAMBRATIO 3.06* 04/22/2008   No results found for: Kandis Cocking, IGMSERUM Lab Results  Component Value Date   TOTALPROTELP 6.3 04/22/2008   ALBUMINELP 66.3* 04/22/2008   A1GS 3.9 04/22/2008   A2GS 8.1 04/22/2008   BETS 5.2 04/22/2008   BETA2SER 4.0 04/22/2008   GAMS 12.5 04/22/2008   MSPIKE NOT DET 04/22/2008   SPEI * 04/22/2008     Chemistry      Component Value Date/Time   NA 134 05/17/2015 0936   NA 128* 03/31/2015 1605   K 4.5 05/17/2015 0936   K 4.5 03/31/2015 1605   CL 103 05/17/2015 0936   CL 99 03/31/2015 1605   CO2 27 05/17/2015 0936   CO2 22 03/31/2015 1605   BUN 11 05/17/2015 0936   BUN 18 03/31/2015 1605   CREATININE 1.0 05/17/2015 0936   CREATININE 1.51* 03/31/2015 1605      Component Value Date/Time   CALCIUM 8.8 05/17/2015 0936   CALCIUM 8.8 03/31/2015 1605   ALKPHOS 43 05/17/2015 0936   ALKPHOS 42 03/23/2015 0410   AST 26 05/17/2015 0936   AST 21 03/23/2015 0410   ALT 25 05/17/2015 0936   ALT 16* 03/23/2015 0410   BILITOT 1.10 05/17/2015 0936   BILITOT 1.2 03/23/2015 0410     Impression and Plan: Alexander Rice is 81 yo white male with myelodysplasia and refractory anemia with ringed sideroblasts. He is doing well and has no complaints at this time. His Hgb is holding at 8.7 with an MCV of 99.  He will continue to take his B complex vitamin daily and his B 12 injections monthly.  We will give him his procrit injection today and continue them every [redacted] weeks along with labs every two weeks.  We will plan to see him back in 6 weeks for follow-up.   Both he and his daughter know to contact us with any questions or concerns. We can certainly see him sooner if need be.    Eliezer Bottom, NP 8/10/20163:42 PM

## 2015-06-30 LAB — IRON AND TIBC CHCC
%SAT: 30 % (ref 20–55)
Iron: 60 ug/dL (ref 42–163)
TIBC: 199 ug/dL — ABNORMAL LOW (ref 202–409)
UIBC: 138 ug/dL (ref 117–376)

## 2015-06-30 LAB — FERRITIN CHCC: FERRITIN: 812 ng/mL — AB (ref 22–316)

## 2015-07-08 ENCOUNTER — Other Ambulatory Visit: Payer: Self-pay | Admitting: Family Medicine

## 2015-07-13 ENCOUNTER — Ambulatory Visit (HOSPITAL_BASED_OUTPATIENT_CLINIC_OR_DEPARTMENT_OTHER): Payer: Medicare Other

## 2015-07-13 ENCOUNTER — Other Ambulatory Visit (HOSPITAL_BASED_OUTPATIENT_CLINIC_OR_DEPARTMENT_OTHER): Payer: Medicare Other

## 2015-07-13 VITALS — BP 97/46 | HR 50 | Temp 97.2°F | Resp 18

## 2015-07-13 DIAGNOSIS — D461 Refractory anemia with ring sideroblasts: Secondary | ICD-10-CM

## 2015-07-13 DIAGNOSIS — D469 Myelodysplastic syndrome, unspecified: Secondary | ICD-10-CM

## 2015-07-13 DIAGNOSIS — D518 Other vitamin B12 deficiency anemias: Secondary | ICD-10-CM

## 2015-07-13 DIAGNOSIS — D460 Refractory anemia without ring sideroblasts, so stated: Secondary | ICD-10-CM | POA: Diagnosis not present

## 2015-07-13 LAB — COMPREHENSIVE METABOLIC PANEL
ALBUMIN: 3.9 g/dL (ref 3.6–5.1)
ALK PHOS: 43 U/L (ref 40–115)
ALT: 14 U/L (ref 9–46)
AST: 19 U/L (ref 10–35)
BILIRUBIN TOTAL: 1.1 mg/dL (ref 0.2–1.2)
BUN: 17 mg/dL (ref 7–25)
CO2: 24 mmol/L (ref 20–31)
CREATININE: 1 mg/dL (ref 0.70–1.11)
Calcium: 8.6 mg/dL (ref 8.6–10.3)
Chloride: 98 mmol/L (ref 98–110)
Glucose, Bld: 96 mg/dL (ref 65–99)
Potassium: 4.6 mmol/L (ref 3.5–5.3)
SODIUM: 130 mmol/L — AB (ref 135–146)
TOTAL PROTEIN: 5.4 g/dL — AB (ref 6.1–8.1)

## 2015-07-13 LAB — CBC WITH DIFFERENTIAL (CANCER CENTER ONLY)
BASO#: 0 10*3/uL (ref 0.0–0.2)
BASO%: 0.3 % (ref 0.0–2.0)
EOS%: 1.2 % (ref 0.0–7.0)
Eosinophils Absolute: 0 10*3/uL (ref 0.0–0.5)
HEMATOCRIT: 25.7 % — AB (ref 38.7–49.9)
HGB: 9 g/dL — ABNORMAL LOW (ref 13.0–17.1)
LYMPH#: 1.1 10*3/uL (ref 0.9–3.3)
LYMPH%: 31.5 % (ref 14.0–48.0)
MCH: 34.4 pg — ABNORMAL HIGH (ref 28.0–33.4)
MCHC: 35 g/dL (ref 32.0–35.9)
MCV: 98 fL (ref 82–98)
MONO#: 0.3 10*3/uL (ref 0.1–0.9)
MONO%: 8.2 % (ref 0.0–13.0)
NEUT#: 2 10*3/uL (ref 1.5–6.5)
NEUT%: 58.8 % (ref 40.0–80.0)
PLATELETS: 354 10*3/uL (ref 145–400)
RBC: 2.62 10*6/uL — ABNORMAL LOW (ref 4.20–5.70)
RDW: 17.6 % — AB (ref 11.1–15.7)
WBC: 3.4 10*3/uL — ABNORMAL LOW (ref 4.0–10.0)

## 2015-07-13 LAB — RETICULOCYTES (CHCC)
ABS Retic: 27.4 10*3/uL (ref 19.0–186.0)
RBC.: 2.74 MIL/uL — AB (ref 4.22–5.81)
RETIC CT PCT: 1 % (ref 0.4–2.3)

## 2015-07-13 LAB — IRON AND TIBC CHCC
%SAT: >100 % (ref 20–?)
Iron: 213 ug/dL — ABNORMAL HIGH (ref 42–163)
TIBC: 193 ug/dL — ABNORMAL LOW (ref 202–409)
UIBC: <1 ug/dL (ref 117–376)

## 2015-07-13 LAB — FERRITIN CHCC: Ferritin: 815 ng/ml — ABNORMAL HIGH (ref 22–316)

## 2015-07-13 MED ORDER — EPOETIN ALFA 40000 UNIT/ML IJ SOLN
INTRAMUSCULAR | Status: AC
  Filled 2015-07-13: qty 1

## 2015-07-13 MED ORDER — EPOETIN ALFA 40000 UNIT/ML IJ SOLN
40000.0000 [IU] | Freq: Once | INTRAMUSCULAR | Status: AC
  Administered 2015-07-13: 40000 [IU] via SUBCUTANEOUS

## 2015-07-13 NOTE — Progress Notes (Signed)
M.d aware of pt low blood pressure. Per m.d it ok to treat.

## 2015-07-13 NOTE — Patient Instructions (Signed)

## 2015-07-27 ENCOUNTER — Other Ambulatory Visit (HOSPITAL_BASED_OUTPATIENT_CLINIC_OR_DEPARTMENT_OTHER): Payer: Medicare Other

## 2015-07-27 ENCOUNTER — Ambulatory Visit (HOSPITAL_BASED_OUTPATIENT_CLINIC_OR_DEPARTMENT_OTHER): Payer: Medicare Other

## 2015-07-27 VITALS — BP 139/44 | HR 58 | Temp 98.2°F | Resp 18

## 2015-07-27 DIAGNOSIS — D460 Refractory anemia without ring sideroblasts, so stated: Secondary | ICD-10-CM | POA: Diagnosis not present

## 2015-07-27 DIAGNOSIS — D469 Myelodysplastic syndrome, unspecified: Secondary | ICD-10-CM

## 2015-07-27 DIAGNOSIS — D518 Other vitamin B12 deficiency anemias: Secondary | ICD-10-CM

## 2015-07-27 DIAGNOSIS — D461 Refractory anemia with ring sideroblasts: Secondary | ICD-10-CM | POA: Diagnosis present

## 2015-07-27 LAB — COMPREHENSIVE METABOLIC PANEL (CC13)
ALT: 16 U/L (ref 0–55)
AST: 21 U/L (ref 5–34)
Albumin: 3.9 g/dL (ref 3.5–5.0)
Alkaline Phosphatase: 46 U/L (ref 40–150)
Anion Gap: 6 mEq/L (ref 3–11)
BUN: 17.1 mg/dL (ref 7.0–26.0)
CHLORIDE: 101 meq/L (ref 98–109)
CO2: 25 meq/L (ref 22–29)
Calcium: 9 mg/dL (ref 8.4–10.4)
Creatinine: 1.1 mg/dL (ref 0.7–1.3)
EGFR: 66 mL/min/{1.73_m2} — AB (ref >90)
GLUCOSE: 93 mg/dL (ref 70–140)
POTASSIUM: 4.8 meq/L (ref 3.5–5.1)
SODIUM: 132 meq/L — AB (ref 136–145)
TOTAL PROTEIN: 5.6 g/dL — AB (ref 6.4–8.3)
Total Bilirubin: 1.19 mg/dL (ref 0.20–1.20)

## 2015-07-27 LAB — CBC WITH DIFFERENTIAL (CANCER CENTER ONLY)
BASO#: 0 10*3/uL (ref 0.0–0.2)
BASO%: 0.3 % (ref 0.0–2.0)
EOS ABS: 0.1 10*3/uL (ref 0.0–0.5)
EOS%: 1.7 % (ref 0.0–7.0)
HCT: 26.3 % — ABNORMAL LOW (ref 38.7–49.9)
HEMOGLOBIN: 9 g/dL — AB (ref 13.0–17.1)
LYMPH#: 0.9 10*3/uL (ref 0.9–3.3)
LYMPH%: 31.2 % (ref 14.0–48.0)
MCH: 33.6 pg — AB (ref 28.0–33.4)
MCHC: 34.2 g/dL (ref 32.0–35.9)
MCV: 98 fL (ref 82–98)
MONO#: 0.3 10*3/uL (ref 0.1–0.9)
MONO%: 10.2 % (ref 0.0–13.0)
NEUT%: 56.6 % (ref 40.0–80.0)
NEUTROS ABS: 1.7 10*3/uL (ref 1.5–6.5)
Platelets: 316 10*3/uL (ref 145–400)
RBC: 2.68 10*6/uL — ABNORMAL LOW (ref 4.20–5.70)
RDW: 17.9 % — ABNORMAL HIGH (ref 11.1–15.7)
WBC: 3 10*3/uL — ABNORMAL LOW (ref 4.0–10.0)

## 2015-07-27 LAB — RETICULOCYTES (CHCC)
ABS RETIC: 19.5 10*3/uL (ref 19.0–186.0)
RBC.: 2.78 MIL/uL — AB (ref 4.22–5.81)
RETIC CT PCT: 0.7 % (ref 0.4–2.3)

## 2015-07-27 LAB — IRON AND TIBC CHCC
Iron: 218 ug/dL — ABNORMAL HIGH (ref 42–163)
TIBC: 200 ug/dL — ABNORMAL LOW (ref 202–409)
UIBC: <1 ug/dL (ref 117–376)

## 2015-07-27 LAB — FERRITIN CHCC: FERRITIN: 804 ng/mL — AB (ref 22–316)

## 2015-07-27 MED ORDER — EPOETIN ALFA 40000 UNIT/ML IJ SOLN
40000.0000 [IU] | Freq: Once | INTRAMUSCULAR | Status: AC
  Administered 2015-07-27: 40000 [IU] via SUBCUTANEOUS

## 2015-07-27 MED ORDER — EPOETIN ALFA 40000 UNIT/ML IJ SOLN
INTRAMUSCULAR | Status: AC
  Filled 2015-07-27: qty 1

## 2015-07-27 NOTE — Patient Instructions (Signed)

## 2015-08-05 ENCOUNTER — Encounter: Payer: Self-pay | Admitting: Podiatry

## 2015-08-05 ENCOUNTER — Ambulatory Visit (INDEPENDENT_AMBULATORY_CARE_PROVIDER_SITE_OTHER): Payer: Medicare Other | Admitting: Podiatry

## 2015-08-05 DIAGNOSIS — M79674 Pain in right toe(s): Secondary | ICD-10-CM

## 2015-08-05 DIAGNOSIS — B351 Tinea unguium: Secondary | ICD-10-CM | POA: Diagnosis not present

## 2015-08-05 DIAGNOSIS — M79676 Pain in unspecified toe(s): Secondary | ICD-10-CM

## 2015-08-05 DIAGNOSIS — M79675 Pain in left toe(s): Secondary | ICD-10-CM | POA: Diagnosis not present

## 2015-08-05 NOTE — Progress Notes (Signed)
Patient ID: Alexander Rice, male   DOB: 09/17/1932, 82 y.o.   MRN: [id number] HPI  Complaint:  Visit Type: Patient returns to my office for continued preventative foot care services. Complaint: Patient states" my nails have grown long and thick and become painful to walk and wear shoes. He presents for preventative foot care services. No changes to ROS  Podiatric Exam: Vascular: dorsalis pedis and posterior tibial pulses are negative. Capillary return is diminished.. Temperature gradient is negative. Skin turgor WNL,   Sensorium: Normal Semmes Weinstein monofilament test. Normal tactile sensation bilaterally.  Nail Exam: Pt has thick disfigured discolored nails with subungual debris noted bilateral entire nail hallux  Ulcer Exam: There is no evidence of ulcer or pre-ulcerative changes or infection. Orthopedic Exam: Muscle tone and strength are WNL. No limitations in general ROM. No crepitus or effusions noted. Foot type and digits show no abnormalities. Bony prominences are unremarkable. Skin: No Porokeratosis. No infection or ulcers  Diagnosis:  Tinea unguium, Pain in right toe, pain in left toes  Treatment & Plan Procedures and Treatment: Consent by patient was obtained for treatment procedures. The patient understood the discussion of treatment and procedures well. All questions were answered thoroughly reviewed. Debridement of mycotic and hypertrophic toenails, 1 through 5 bilateral and clearing of subungual debris. No ulceration, no infection noted.  Return Visit-Office Procedure: Patient instructed to return to the office for a follow up visit 3 months for continued evaluation and treatment.  

## 2015-08-11 ENCOUNTER — Ambulatory Visit (HOSPITAL_BASED_OUTPATIENT_CLINIC_OR_DEPARTMENT_OTHER): Payer: Medicare Other | Admitting: Hematology & Oncology

## 2015-08-11 ENCOUNTER — Ambulatory Visit (HOSPITAL_BASED_OUTPATIENT_CLINIC_OR_DEPARTMENT_OTHER): Payer: Medicare Other

## 2015-08-11 ENCOUNTER — Other Ambulatory Visit (HOSPITAL_BASED_OUTPATIENT_CLINIC_OR_DEPARTMENT_OTHER): Payer: Medicare Other

## 2015-08-11 ENCOUNTER — Encounter: Payer: Self-pay | Admitting: Hematology & Oncology

## 2015-08-11 VITALS — BP 157/49 | HR 58 | Temp 97.3°F | Resp 16 | Ht 67.0 in | Wt 149.0 lb

## 2015-08-11 DIAGNOSIS — D469 Myelodysplastic syndrome, unspecified: Secondary | ICD-10-CM

## 2015-08-11 DIAGNOSIS — D460 Refractory anemia without ring sideroblasts, so stated: Secondary | ICD-10-CM | POA: Diagnosis not present

## 2015-08-11 DIAGNOSIS — I2510 Atherosclerotic heart disease of native coronary artery without angina pectoris: Secondary | ICD-10-CM

## 2015-08-11 DIAGNOSIS — D461 Refractory anemia with ring sideroblasts: Secondary | ICD-10-CM

## 2015-08-11 DIAGNOSIS — D518 Other vitamin B12 deficiency anemias: Secondary | ICD-10-CM

## 2015-08-11 LAB — COMPREHENSIVE METABOLIC PANEL
ALK PHOS: 42 U/L (ref 40–115)
ALT: 15 U/L (ref 9–46)
AST: 18 U/L (ref 10–35)
Albumin: 4.1 g/dL (ref 3.6–5.1)
BUN: 14 mg/dL (ref 7–25)
CALCIUM: 8.8 mg/dL (ref 8.6–10.3)
CO2: 24 mmol/L (ref 20–31)
Chloride: 102 mmol/L (ref 98–110)
Creatinine, Ser: 1.03 mg/dL (ref 0.70–1.11)
Glucose, Bld: 88 mg/dL (ref 65–99)
POTASSIUM: 4.2 mmol/L (ref 3.5–5.3)
Sodium: 134 mmol/L — ABNORMAL LOW (ref 135–146)
TOTAL PROTEIN: 5.6 g/dL — AB (ref 6.1–8.1)
Total Bilirubin: 1 mg/dL (ref 0.2–1.2)

## 2015-08-11 LAB — CBC WITH DIFFERENTIAL (CANCER CENTER ONLY)
BASO#: 0 10*3/uL (ref 0.0–0.2)
BASO%: 0.4 % (ref 0.0–2.0)
EOS%: 1.4 % (ref 0.0–7.0)
Eosinophils Absolute: 0 10*3/uL (ref 0.0–0.5)
HEMATOCRIT: 27.8 % — AB (ref 38.7–49.9)
HGB: 9.5 g/dL — ABNORMAL LOW (ref 13.0–17.1)
LYMPH#: 0.8 10*3/uL — AB (ref 0.9–3.3)
LYMPH%: 27.1 % (ref 14.0–48.0)
MCH: 33.9 pg — ABNORMAL HIGH (ref 28.0–33.4)
MCHC: 34.2 g/dL (ref 32.0–35.9)
MCV: 99 fL — AB (ref 82–98)
MONO#: 0.3 10*3/uL (ref 0.1–0.9)
MONO%: 10.6 % (ref 0.0–13.0)
NEUT#: 1.7 10*3/uL (ref 1.5–6.5)
NEUT%: 60.5 % (ref 40.0–80.0)
Platelets: 359 10*3/uL (ref 145–400)
RBC: 2.8 10*6/uL — AB (ref 4.20–5.70)
RDW: 18.2 % — ABNORMAL HIGH (ref 11.1–15.7)
WBC: 2.8 10*3/uL — AB (ref 4.0–10.0)

## 2015-08-11 LAB — IRON AND TIBC CHCC
%SAT: >100 % (ref 20–?)
Iron: 214 ug/dL — ABNORMAL HIGH (ref 42–163)
TIBC: 201 ug/dL — AB (ref 202–409)

## 2015-08-11 LAB — RETICULOCYTES (CHCC)
ABS RETIC: 23.3 10*3/uL (ref 19.0–186.0)
RBC.: 2.91 MIL/uL — ABNORMAL LOW (ref 4.22–5.81)
Retic Ct Pct: 0.8 % (ref 0.4–2.3)

## 2015-08-11 LAB — FERRITIN CHCC: Ferritin: 913 ng/ml — ABNORMAL HIGH (ref 22–316)

## 2015-08-11 MED ORDER — EPOETIN ALFA 40000 UNIT/ML IJ SOLN
40000.0000 [IU] | Freq: Once | INTRAMUSCULAR | Status: AC
  Administered 2015-08-11: 40000 [IU] via SUBCUTANEOUS

## 2015-08-11 MED ORDER — EPOETIN ALFA 40000 UNIT/ML IJ SOLN
INTRAMUSCULAR | Status: AC
  Filled 2015-08-11: qty 1

## 2015-08-11 NOTE — Progress Notes (Signed)
Hematology and Oncology Follow Up Visit  Alexander Rice [id number] 1932-10-23 81 y.o. 08/11/2015   Principle Diagnosis:   Refractory anemia with ringed sideroblasts.  Pernicious anemia  Severe osteoarthritis  Current Therapy:    Procrit 40,000 units subcutaneous every 2 weeks  Vitamin B6 250 mg by mouth daily  Vitamin B-12 1 mg IM every month-done at home     Interim History:  Alexander Rice is back for follow-up. He is doing better. He is feeling a little better. His hemoglobin is improving, slowly but surely.  He's had a problems with cough. He's had no nausea or vomiting. His been no change in bowel or bladder habits.  He's not noted any problems with leg swelling.  He's had no rashes.  His last iron studies done back in early September showed a ferritin of 804. He had a total iron of 218.  Thank you, we've not had to transfuse him for several months.  Overall, his performance status is ECOG 2.  Medications:  Current outpatient prescriptions:  .  aspirin 81 MG tablet, Take 81 mg by mouth daily.  , Disp: , Rfl:  .  [employer] OMEGA-3 KRILL OIL PO, Take 350 mg by mouth daily., Disp: , Rfl:  .  donepezil (ARICEPT) 10 MG tablet, Take 1 tablet daily, Disp: 90 tablet, Rfl: 3 .  dorzolamide (TRUSOPT) 2 % ophthalmic solution, Place 1 drop into both eyes 2 (two) times daily.  , Disp: , Rfl:  .  doxazosin (CARDURA) 8 MG tablet, Take 1 tablet by mouth at  bedtime, Disp: 90 tablet, Rfl: 3 .  isosorbide mononitrate (IMDUR) 30 MG 24 hr tablet, Take 1 tablet by mouth  (30mg ) daily, Disp: 90 tablet, Rfl: 2 .  LUMIGAN 0.01 % SOLN, Place 1 drop into both eyes at bedtime. , Disp: , Rfl:  .  Multiple Vitamin (MULTIVITAMIN) tablet, Take 1 tablet by mouth daily.  , Disp: , Rfl:  .  nitroGLYCERIN (NITROSTAT) 0.4 MG SL tablet, Place 0.4 mg under the tongue every 5 (five) minutes as needed.  , Disp: , Rfl:  .  omeprazole (PRILOSEC) 20 MG capsule, , Disp: , Rfl:  .  Probiotic Product ([employer]S  PROBIOTIC) CHEW, Chew by mouth daily., Disp: , Rfl:  .  Pyridoxine HCl (VITAMIN B-6) 250 MG tablet, Take 1 tablet (250 mg total) by mouth daily., Disp: 90 tablet, Rfl: 3 .  simvastatin (ZOCOR) 40 MG tablet, TAKE 1 TABLET BY MOUTH  DAILY AT 6 PM, Disp: 90 tablet, Rfl: 2 .  VESICARE 10 MG tablet, Take 10 mg by mouth daily. , Disp: , Rfl:  No current facility-administered medications for this visit.  Facility-Administered Medications Ordered in Other Visits:  .  darbepoetin (ARANESP) injection 300 mcg, 300 mcg, Subcutaneous, Once, [name], MD  Allergies: No Known Allergies  Past Medical History, Surgical history, Social history, and Family History were reviewed and updated.  Review of Systems: As above  Physical Exam:  height is 5\' 7"  (1.702 m) and weight is 149 lb (67.586 kg). His oral temperature is 97.3 F (36.3 C). His blood pressure is 157/49 and his pulse is 58. His respiration is 16.   Wt Readings from Last 3 Encounters:  08/11/15 149 lb (67.586 kg)  06/29/15 153 lb (69.4 kg)  06/10/15 152 lb (68.947 kg)     Elderly white gentleman in no obvious distress. Head and neck exam shows no ocular or oral lesions. There are no palpable cervical or supraclavicular lymph nodes.  Lungs are clear. Cardiac exam regular rate and rhythm with no murmurs, rubs or bruits. Abdomen is soft. He has good bowel sounds. There is no fluid wave. There is no palpable liver or spleen tip. Back exam shows some kyphosis. Extremities shows some age-related osteoarthritic changes. He has decent range of motion of his joints. Skin exam shows no rashes, ecchymoses or petechia.  Lab Results  Component Value Date   WBC 2.8* 08/11/2015   HGB 9.5* 08/11/2015   HCT 27.8* 08/11/2015   MCV 99* 08/11/2015   PLT 359 08/11/2015     Chemistry      Component Value Date/Time   NA 132* 07/27/2015 0959   NA 130* 07/13/2015 0959   NA 134 05/17/2015 0936   K 4.8 07/27/2015 0959   K 4.6 07/13/2015 0959   K 4.5  05/17/2015 0936   CL 98 07/13/2015 0959   CL 103 05/17/2015 0936   CO2 25 07/27/2015 0959   CO2 24 07/13/2015 0959   CO2 27 05/17/2015 0936   BUN 17.1 07/27/2015 0959   BUN 17 07/13/2015 0959   BUN 11 05/17/2015 0936   CREATININE 1.1 07/27/2015 0959   CREATININE 1.00 07/13/2015 0959   CREATININE 1.0 05/17/2015 0936      Component Value Date/Time   CALCIUM 9.0 07/27/2015 0959   CALCIUM 8.6 07/13/2015 0959   CALCIUM 8.8 05/17/2015 0936   ALKPHOS 46 07/27/2015 0959   ALKPHOS 43 07/13/2015 0959   ALKPHOS 43 05/17/2015 0936   AST 21 07/27/2015 0959   AST 19 07/13/2015 0959   AST 26 05/17/2015 0936   ALT 16 07/27/2015 0959   ALT 14 07/13/2015 0959   ALT 25 05/17/2015 0936   BILITOT 1.19 07/27/2015 0959   BILITOT 1.1 07/13/2015 0959   BILITOT 1.10 05/17/2015 0936         Impression and Plan: Alexander Rice is 81 year old gentleman with myelodysplasia. He has refractory anemia with ringed sideroblasts.  Hopefully, the increased frequency of Procrit is helping him.  With his blood count doing better, we will try to pull him back to every 3 weeks for right now.  He will continue the B-12 at home.  He takes oral B-6 and doing well with this.  ee him back in 6 weeks. He'll come back every3 weeks for a CBC and Procrit.    [name], MD 9/22/201610:10 AM

## 2015-08-11 NOTE — Patient Instructions (Signed)
Darbepoetin Alfa injection What is this medicine? DARBEPOETIN ALFA (dar be POE e tin AL fa) helps your body make more red blood cells. It is used to treat anemia caused by chronic kidney failure and chemotherapy. This medicine may be used for other purposes; ask your health care provider or pharmacist if you have questions. COMMON BRAND NAME(S): Aranesp What should I tell my health care provider before I take this medicine? They need to know if you have any of these conditions: -blood clotting disorders or history of blood clots -cancer patient not on chemotherapy -cystic fibrosis -heart disease, such as angina, heart failure, or a history of a heart attack -hemoglobin level of 12 g/dL or greater -high blood pressure -low levels of folate, iron, or vitamin B12 -seizures -an unusual or allergic reaction to darbepoetin, erythropoietin, albumin, hamster proteins, latex, other medicines, foods, dyes, or preservatives -pregnant or trying to get pregnant -breast-feeding How should I use this medicine? This medicine is for injection into a vein or under the skin. It is usually given by a health care professional in a hospital or clinic setting. If you get this medicine at home, you will be taught how to prepare and give this medicine. Do not shake the solution before you withdraw a dose. Use exactly as directed. Take your medicine at regular intervals. Do not take your medicine more often than directed. It is important that you put your used needles and syringes in a special sharps container. Do not put them in a trash can. If you do not have a sharps container, call your pharmacist or healthcare provider to get one. Talk to your pediatrician regarding the use of this medicine in children. While this medicine may be used in children as young as 1 year for selected conditions, precautions do apply. Overdosage: If you think you have taken too much of this medicine contact a poison control center or  emergency room at once. NOTE: This medicine is only for you. Do not share this medicine with others. What if I miss a dose? If you miss a dose, take it as soon as you can. If it is almost time for your next dose, take only that dose. Do not take double or extra doses. What may interact with this medicine? Do not take this medicine with any of the following medications: -epoetin alfa This list may not describe all possible interactions. Give your health care provider a list of all the medicines, herbs, non-prescription drugs, or dietary supplements you use. Also tell them if you smoke, drink alcohol, or use illegal drugs. Some items may interact with your medicine. What should I watch for while using this medicine? Visit your prescriber or health care professional for regular checks on your progress and for the needed blood tests and blood pressure measurements. It is especially important for the doctor to make sure your hemoglobin level is in the desired range, to limit the risk of potential side effects and to give you the best benefit. Keep all appointments for any recommended tests. Check your blood pressure as directed. Ask your doctor what your blood pressure should be and when you should contact him or her. As your body makes more red blood cells, you may need to take iron, folic acid, or vitamin B supplements. Ask your doctor or health care provider which products are right for you. If you have kidney disease continue dietary restrictions, even though this medication can make you feel better. Talk with your doctor or health   care professional about the foods you eat and the vitamins that you take. What side effects may I notice from receiving this medicine? Side effects that you should report to your doctor or health care professional as soon as possible: -allergic reactions like skin rash, itching or hives, swelling of the face, lips, or tongue -breathing problems -changes in vision -chest  pain -confusion, trouble speaking or understanding -feeling faint or lightheaded, falls -high blood pressure -muscle aches or pains -pain, swelling, warmth in the leg -rapid weight gain -severe headaches -sudden numbness or weakness of the face, arm or leg -trouble walking, dizziness, loss of balance or coordination -seizures (convulsions) -swelling of the ankles, feet, hands -unusually weak or tired Side effects that usually do not require medical attention (report to your doctor or health care professional if they continue or are bothersome): -diarrhea -fever, chills (flu-like symptoms) -headaches -nausea, vomiting -redness, stinging, or swelling at site where injected This list may not describe all possible side effects. Call your doctor for medical advice about side effects. You may report side effects to FDA at 1-800-FDA-1088. Where should I keep my medicine? Keep out of the reach of children. Store in a refrigerator between 2 and 8 degrees C (36 and 46 degrees F). Do not freeze. Do not shake. Throw away any unused portion if using a single-dose vial. Throw away any unused medicine after the expiration date. NOTE: This sheet is a summary. It may not cover all possible information. If you have questions about this medicine, talk to your doctor, pharmacist, or health care provider.  2015, [employer]. (2008-10-19 10:23:57)  

## 2015-09-01 ENCOUNTER — Other Ambulatory Visit (HOSPITAL_BASED_OUTPATIENT_CLINIC_OR_DEPARTMENT_OTHER): Payer: Medicare Other

## 2015-09-01 ENCOUNTER — Ambulatory Visit (HOSPITAL_BASED_OUTPATIENT_CLINIC_OR_DEPARTMENT_OTHER): Payer: Medicare Other

## 2015-09-01 DIAGNOSIS — D461 Refractory anemia with ring sideroblasts: Secondary | ICD-10-CM | POA: Diagnosis present

## 2015-09-01 DIAGNOSIS — D518 Other vitamin B12 deficiency anemias: Secondary | ICD-10-CM

## 2015-09-01 DIAGNOSIS — D469 Myelodysplastic syndrome, unspecified: Secondary | ICD-10-CM | POA: Diagnosis present

## 2015-09-01 DIAGNOSIS — D460 Refractory anemia without ring sideroblasts, so stated: Secondary | ICD-10-CM | POA: Diagnosis not present

## 2015-09-01 LAB — COMPREHENSIVE METABOLIC PANEL (CC13)
ALBUMIN: 3.9 g/dL (ref 3.5–5.0)
ALK PHOS: 47 U/L (ref 40–150)
ALT: 16 U/L (ref 0–55)
AST: 19 U/L (ref 5–34)
Anion Gap: 5 mEq/L (ref 3–11)
BUN: 19.8 mg/dL (ref 7.0–26.0)
CHLORIDE: 101 meq/L (ref 98–109)
CO2: 23 mEq/L (ref 22–29)
Calcium: 8.7 mg/dL (ref 8.4–10.4)
Creatinine: 0.9 mg/dL (ref 0.7–1.3)
EGFR: 76 mL/min/{1.73_m2} — AB (ref >90)
GLUCOSE: 95 mg/dL (ref 70–140)
POTASSIUM: 4.7 meq/L (ref 3.5–5.1)
SODIUM: 129 meq/L — AB (ref 136–145)
Total Bilirubin: 1.07 mg/dL (ref 0.20–1.20)
Total Protein: 5.6 g/dL — ABNORMAL LOW (ref 6.4–8.3)

## 2015-09-01 LAB — CBC WITH DIFFERENTIAL (CANCER CENTER ONLY)
BASO#: 0 10*3/uL (ref 0.0–0.2)
BASO%: 0.2 % (ref 0.0–2.0)
EOS%: 0.7 % (ref 0.0–7.0)
Eosinophils Absolute: 0 10*3/uL (ref 0.0–0.5)
HCT: 24.4 % — ABNORMAL LOW (ref 38.7–49.9)
HEMOGLOBIN: 8.4 g/dL — AB (ref 13.0–17.1)
LYMPH#: 1 10*3/uL (ref 0.9–3.3)
LYMPH%: 24.1 % (ref 14.0–48.0)
MCH: 33.7 pg — AB (ref 28.0–33.4)
MCHC: 34.4 g/dL (ref 32.0–35.9)
MCV: 98 fL (ref 82–98)
MONO#: 0.3 10*3/uL (ref 0.1–0.9)
MONO%: 7.8 % (ref 0.0–13.0)
NEUT#: 2.8 10*3/uL (ref 1.5–6.5)
NEUT%: 67.2 % (ref 40.0–80.0)
PLATELETS: 387 10*3/uL (ref 145–400)
RBC: 2.49 10*6/uL — ABNORMAL LOW (ref 4.20–5.70)
RDW: 18 % — AB (ref 11.1–15.7)
WBC: 4.1 10*3/uL (ref 4.0–10.0)

## 2015-09-01 LAB — RETICULOCYTES (CHCC)
ABS RETIC: 18.1 10*3/uL — AB (ref 19.0–186.0)
RBC.: 2.59 MIL/uL — AB (ref 4.22–5.81)
RETIC CT PCT: 0.7 % (ref 0.4–2.3)

## 2015-09-01 MED ORDER — EPOETIN ALFA 40000 UNIT/ML IJ SOLN
40000.0000 [IU] | Freq: Once | INTRAMUSCULAR | Status: AC
  Administered 2015-09-01: 40000 [IU] via SUBCUTANEOUS

## 2015-09-01 MED ORDER — EPOETIN ALFA 40000 UNIT/ML IJ SOLN
INTRAMUSCULAR | Status: AC
  Filled 2015-09-01: qty 1

## 2015-09-01 NOTE — Patient Instructions (Signed)
Epoetin Alfa injection What is this medicine? EPOETIN ALFA (e POE e tin AL fa) helps your body make more red blood cells. This medicine is used to treat anemia caused by chronic kidney failure, cancer chemotherapy, or HIV-therapy. It may also be used before surgery if you have anemia. This medicine may be used for other purposes; ask your health care provider or pharmacist if you have questions. What should I tell my health care provider before I take this medicine? They need to know if you have any of these conditions: -blood clotting disorders -cancer patient not on chemotherapy -cystic fibrosis -heart disease, such as angina or heart failure -hemoglobin level of 12 g/dL or greater -high blood pressure -low levels of folate, iron, or vitamin B12 -seizures -an unusual or allergic reaction to erythropoietin, albumin, benzyl alcohol, hamster proteins, other medicines, foods, dyes, or preservatives -pregnant or trying to get pregnant -breast-feeding How should I use this medicine? This medicine is for injection into a vein or under the skin. It is usually given by a health care professional in a hospital or clinic setting. If you get this medicine at home, you will be taught how to prepare and give this medicine. Use exactly as directed. Take your medicine at regular intervals. Do not take your medicine more often than directed. It is important that you put your used needles and syringes in a special sharps container. Do not put them in a trash can. If you do not have a sharps container, call your pharmacist or healthcare provider to get one. Talk to your pediatrician regarding the use of this medicine in children. While this drug may be prescribed for selected conditions, precautions do apply. Overdosage: If you think you have taken too much of this medicine contact a poison control center or emergency room at once. NOTE: This medicine is only for you. Do not share this medicine with  others. What if I miss a dose? If you miss a dose, take it as soon as you can. If it is almost time for your next dose, take only that dose. Do not take double or extra doses. What may interact with this medicine? Do not take this medicine with any of the following medications: -darbepoetin alfa This list may not describe all possible interactions. Give your health care provider a list of all the medicines, herbs, non-prescription drugs, or dietary supplements you use. Also tell them if you smoke, drink alcohol, or use illegal drugs. Some items may interact with your medicine. What should I watch for while using this medicine? Visit your prescriber or health care professional for regular checks on your progress and for the needed blood tests and blood pressure measurements. It is especially important for the doctor to make sure your hemoglobin level is in the desired range, to limit the risk of potential side effects and to give you the best benefit. Keep all appointments for any recommended tests. Check your blood pressure as directed. Ask your doctor what your blood pressure should be and when you should contact him or her. As your body makes more red blood cells, you may need to take iron, folic acid, or vitamin B supplements. Ask your doctor or health care provider which products are right for you. If you have kidney disease continue dietary restrictions, even though this medication can make you feel better. Talk with your doctor or health care professional about the foods you eat and the vitamins that you take. What side effects may I notice   from receiving this medicine? Side effects that you should report to your doctor or health care professional as soon as possible: -allergic reactions like skin rash, itching or hives, swelling of the face, lips, or tongue -breathing problems -changes in vision -chest pain -confusion, trouble speaking or understanding -feeling faint or lightheaded,  falls -high blood pressure -muscle aches or pains -pain, swelling, warmth in the leg -rapid weight gain -severe headaches -sudden numbness or weakness of the face, arm or leg -trouble walking, dizziness, loss of balance or coordination -seizures (convulsions) -swelling of the ankles, feet, hands -unusually weak or tired Side effects that usually do not require medical attention (report to your doctor or health care professional if they continue or are bothersome): -diarrhea -fever, chills (flu-like symptoms) -headaches -nausea, vomiting -redness, stinging, or swelling at site where injected This list may not describe all possible side effects. Call your doctor for medical advice about side effects. You may report side effects to FDA at 1-800-FDA-1088. Where should I keep my medicine? Keep out of the reach of children. Store in a refrigerator between 2 and 8 degrees C (36 and 46 degrees F). Do not freeze or shake. Throw away any unused portion if using a single-dose vial. Multi-dose vials can be kept in the refrigerator for up to 21 days after the initial dose. Throw away unused medicine. NOTE: This sheet is a summary. It may not cover all possible information. If you have questions about this medicine, talk to your doctor, pharmacist, or health care provider.    2016, [employer] Standard. (2008-10-19 10:25:44)  

## 2015-09-02 LAB — IRON AND TIBC CHCC
%SAT: 96 % — AB (ref 20–55)
Iron: 188 ug/dL — ABNORMAL HIGH (ref 42–163)
TIBC: 196 ug/dL — ABNORMAL LOW (ref 202–409)
UIBC: 8 ug/dL — AB (ref 117–376)

## 2015-09-02 LAB — FERRITIN CHCC: FERRITIN: 878 ng/mL — AB (ref 22–316)

## 2015-09-23 ENCOUNTER — Ambulatory Visit (HOSPITAL_BASED_OUTPATIENT_CLINIC_OR_DEPARTMENT_OTHER): Payer: Medicare Other

## 2015-09-23 ENCOUNTER — Ambulatory Visit (HOSPITAL_BASED_OUTPATIENT_CLINIC_OR_DEPARTMENT_OTHER): Payer: Medicare Other | Admitting: Hematology & Oncology

## 2015-09-23 ENCOUNTER — Encounter: Payer: Self-pay | Admitting: Hematology & Oncology

## 2015-09-23 ENCOUNTER — Other Ambulatory Visit (HOSPITAL_BASED_OUTPATIENT_CLINIC_OR_DEPARTMENT_OTHER): Payer: Medicare Other

## 2015-09-23 VITALS — BP 191/61 | HR 55 | Temp 97.2°F | Resp 14 | Ht 67.0 in | Wt 156.0 lb

## 2015-09-23 DIAGNOSIS — D469 Myelodysplastic syndrome, unspecified: Secondary | ICD-10-CM | POA: Diagnosis not present

## 2015-09-23 DIAGNOSIS — D461 Refractory anemia with ring sideroblasts: Secondary | ICD-10-CM

## 2015-09-23 LAB — CBC WITH DIFFERENTIAL (CANCER CENTER ONLY)
BASO#: 0 10*3/uL (ref 0.0–0.2)
BASO%: 0.2 % (ref 0.0–2.0)
EOS ABS: 0 10*3/uL (ref 0.0–0.5)
EOS%: 0.9 % (ref 0.0–7.0)
HCT: 25 % — ABNORMAL LOW (ref 38.7–49.9)
HEMOGLOBIN: 8.5 g/dL — AB (ref 13.0–17.1)
LYMPH#: 1.1 10*3/uL (ref 0.9–3.3)
LYMPH%: 25.8 % (ref 14.0–48.0)
MCH: 33.5 pg — AB (ref 28.0–33.4)
MCHC: 34 g/dL (ref 32.0–35.9)
MCV: 98 fL (ref 82–98)
MONO#: 0.3 10*3/uL (ref 0.1–0.9)
MONO%: 8 % (ref 0.0–13.0)
NEUT#: 2.8 10*3/uL (ref 1.5–6.5)
NEUT%: 65.1 % (ref 40.0–80.0)
PLATELETS: 350 10*3/uL (ref 145–400)
RBC: 2.54 10*6/uL — AB (ref 4.20–5.70)
RDW: 18.2 % — ABNORMAL HIGH (ref 11.1–15.7)
WBC: 4.3 10*3/uL (ref 4.0–10.0)

## 2015-09-23 LAB — COMPREHENSIVE METABOLIC PANEL (CC13)
ALBUMIN: 4 g/dL (ref 3.5–5.0)
ALK PHOS: 45 U/L (ref 40–150)
ALT: 15 U/L (ref 0–55)
AST: 22 U/L (ref 5–34)
Anion Gap: 5 mEq/L (ref 3–11)
BUN: 20.8 mg/dL (ref 7.0–26.0)
CHLORIDE: 99 meq/L (ref 98–109)
CO2: 23 mEq/L (ref 22–29)
Calcium: 8.9 mg/dL (ref 8.4–10.4)
Creatinine: 0.9 mg/dL (ref 0.7–1.3)
EGFR: 76 mL/min/{1.73_m2} — AB (ref >90)
GLUCOSE: 94 mg/dL (ref 70–140)
POTASSIUM: 4.8 meq/L (ref 3.5–5.1)
SODIUM: 127 meq/L — AB (ref 136–145)
Total Bilirubin: 0.9 mg/dL (ref 0.20–1.20)
Total Protein: 5.7 g/dL — ABNORMAL LOW (ref 6.4–8.3)

## 2015-09-23 LAB — RETICULOCYTES (CHCC)
ABS Retic: 26.4 10*3/uL (ref 19.0–186.0)
RBC.: 2.64 MIL/uL — AB (ref 4.22–5.81)
RETIC CT PCT: 1 % (ref 0.4–2.3)

## 2015-09-23 MED ORDER — EPOETIN ALFA 40000 UNIT/ML IJ SOLN
INTRAMUSCULAR | Status: AC
  Filled 2015-09-23: qty 1

## 2015-09-23 MED ORDER — EPOETIN ALFA 40000 UNIT/ML IJ SOLN
40000.0000 [IU] | Freq: Once | INTRAMUSCULAR | Status: AC
Start: 2015-09-23 — End: 2015-09-23
  Administered 2015-09-23: 40000 [IU] via SUBCUTANEOUS

## 2015-09-23 NOTE — Patient Instructions (Signed)
Epoetin Alfa injection What is this medicine? EPOETIN ALFA (e POE e tin AL fa) helps your body make more red blood cells. This medicine is used to treat anemia caused by chronic kidney failure, cancer chemotherapy, or HIV-therapy. It may also be used before surgery if you have anemia. This medicine may be used for other purposes; ask your health care provider or pharmacist if you have questions. What should I tell my health care provider before I take this medicine? They need to know if you have any of these conditions: -blood clotting disorders -cancer patient not on chemotherapy -cystic fibrosis -heart disease, such as angina or heart failure -hemoglobin level of 12 g/dL or greater -high blood pressure -low levels of folate, iron, or vitamin B12 -seizures -an unusual or allergic reaction to erythropoietin, albumin, benzyl alcohol, hamster proteins, other medicines, foods, dyes, or preservatives -pregnant or trying to get pregnant -breast-feeding How should I use this medicine? This medicine is for injection into a vein or under the skin. It is usually given by a health care professional in a hospital or clinic setting. If you get this medicine at home, you will be taught how to prepare and give this medicine. Use exactly as directed. Take your medicine at regular intervals. Do not take your medicine more often than directed. It is important that you put your used needles and syringes in a special sharps container. Do not put them in a trash can. If you do not have a sharps container, call your pharmacist or healthcare provider to get one. Talk to your pediatrician regarding the use of this medicine in children. While this drug may be prescribed for selected conditions, precautions do apply. Overdosage: If you think you have taken too much of this medicine contact a poison control center or emergency room at once. NOTE: This medicine is only for you. Do not share this medicine with  others. What if I miss a dose? If you miss a dose, take it as soon as you can. If it is almost time for your next dose, take only that dose. Do not take double or extra doses. What may interact with this medicine? Do not take this medicine with any of the following medications: -darbepoetin alfa This list may not describe all possible interactions. Give your health care provider a list of all the medicines, herbs, non-prescription drugs, or dietary supplements you use. Also tell them if you smoke, drink alcohol, or use illegal drugs. Some items may interact with your medicine. What should I watch for while using this medicine? Visit your prescriber or health care professional for regular checks on your progress and for the needed blood tests and blood pressure measurements. It is especially important for the doctor to make sure your hemoglobin level is in the desired range, to limit the risk of potential side effects and to give you the best benefit. Keep all appointments for any recommended tests. Check your blood pressure as directed. Ask your doctor what your blood pressure should be and when you should contact him or her. As your body makes more red blood cells, you may need to take iron, folic acid, or vitamin B supplements. Ask your doctor or health care provider which products are right for you. If you have kidney disease continue dietary restrictions, even though this medication can make you feel better. Talk with your doctor or health care professional about the foods you eat and the vitamins that you take. What side effects may I notice   from receiving this medicine? Side effects that you should report to your doctor or health care professional as soon as possible: -allergic reactions like skin rash, itching or hives, swelling of the face, lips, or tongue -breathing problems -changes in vision -chest pain -confusion, trouble speaking or understanding -feeling faint or lightheaded,  falls -high blood pressure -muscle aches or pains -pain, swelling, warmth in the leg -rapid weight gain -severe headaches -sudden numbness or weakness of the face, arm or leg -trouble walking, dizziness, loss of balance or coordination -seizures (convulsions) -swelling of the ankles, feet, hands -unusually weak or tired Side effects that usually do not require medical attention (report to your doctor or health care professional if they continue or are bothersome): -diarrhea -fever, chills (flu-like symptoms) -headaches -nausea, vomiting -redness, stinging, or swelling at site where injected This list may not describe all possible side effects. Call your doctor for medical advice about side effects. You may report side effects to FDA at 1-800-FDA-1088. Where should I keep my medicine? Keep out of the reach of children. Store in a refrigerator between 2 and 8 degrees C (36 and 46 degrees F). Do not freeze or shake. Throw away any unused portion if using a single-dose vial. Multi-dose vials can be kept in the refrigerator for up to 21 days after the initial dose. Throw away unused medicine. NOTE: This sheet is a summary. It may not cover all possible information. If you have questions about this medicine, talk to your doctor, pharmacist, or health care provider.    2016, [employer] Standard. (2008-10-19 10:25:44)  

## 2015-09-23 NOTE — Progress Notes (Signed)
Hematology and Oncology Follow Up Visit  [name] [id number] [birth year] 81 y.o. 09/23/2015   Principle Diagnosis:   Refractory anemia with ringed sideroblasts.  Pernicious anemia  Severe osteoarthritis  Current Therapy:    Procrit 40,000 units subcutaneous every 2 weeks  Vitamin B6 250 mg by mouth daily  Vitamin B-12 1 mg IM every month-done at home     Interim History:  [name] is back for follow-up. He is doing better. He is feeling a little better. His hemoglobin is actually doing fairly well. We actually had his hemoglobin up to 9.5.  He did get Procrit about 3-4 weeks ago.  He is eating well. He's having no problems with bowels or bladder.  His back continues to be his biggest problem. There is not much that can be done for this.  He's had no bleeding. He's had no fever.  He's had no rales with cough or shortness of breath.   Overall, his performance status is ECOG 2.  Medications:  Current outpatient prescriptions:  .  aspirin 81 MG tablet, Take 81 mg by mouth daily.  , Disp: , Rfl:  .  [employer]S OMEGA-3 KRILL OIL PO, Take 350 mg by mouth daily., Disp: , Rfl:  .  donepezil (ARICEPT) 10 MG tablet, Take 1 tablet daily, Disp: 90 tablet, Rfl: 3 .  dorzolamide (TRUSOPT) 2 % ophthalmic solution, Place 1 drop into both eyes 2 (two) times daily.  , Disp: , Rfl:  .  doxazosin (CARDURA) 8 MG tablet, Take 1 tablet by mouth at  bedtime, Disp: 90 tablet, Rfl: 3 .  isosorbide mononitrate (IMDUR) 30 MG 24 hr tablet, Take 1 tablet by mouth  (30mg ) daily, Disp: 90 tablet, Rfl: 2 .  LUMIGAN 0.01 % SOLN, Place 1 drop into both eyes at bedtime. , Disp: , Rfl:  .  Multiple Vitamin (MULTIVITAMIN) tablet, Take 1 tablet by mouth daily.  , Disp: , Rfl:  .  nitroGLYCERIN (NITROSTAT) 0.4 MG SL tablet, Place 0.4 mg under the tongue every 5 (five) minutes as needed.  , Disp: , Rfl:  .  omeprazole (PRILOSEC) 20 MG capsule, , Disp: , Rfl:  .  Probiotic Product ([employer]S PROBIOTIC) CHEW,  Chew by mouth daily., Disp: , Rfl:  .  Pyridoxine HCl (VITAMIN B-6) 250 MG tablet, Take 1 tablet (250 mg total) by mouth daily., Disp: 90 tablet, Rfl: 3 .  simvastatin (ZOCOR) 40 MG tablet, TAKE 1 TABLET BY MOUTH  DAILY AT 6 PM, Disp: 90 tablet, Rfl: 2 .  VESICARE 10 MG tablet, Take 10 mg by mouth daily. , Disp: , Rfl:  No current facility-administered medications for this visit.  Facility-Administered Medications Ordered in Other Visits:  .  darbepoetin (ARANESP) injection 300 mcg, 300 mcg, Subcutaneous, Once, [name], MD  Allergies: No Known Allergies  Past Medical History, Surgical history, Social history, and Family History were reviewed and updated.  Review of Systems: As above  Physical Exam:  height is 5\' 7"  (1.702 m) and weight is 156 lb (70.761 kg). His oral temperature is 97.2 F (36.2 C). His blood pressure is 191/61 and his pulse is 55. His respiration is 14.   Wt Readings from Last 3 Encounters:  09/23/15 156 lb (70.761 kg)  08/11/15 149 lb (67.586 kg)  06/29/15 153 lb (69.4 kg)     Elderly white gentleman in no obvious distress. Head and neck exam shows no ocular or oral lesions. There are no palpable cervical or supraclavicular lymph nodes.  Lungs are clear. Cardiac exam regular rate and rhythm with no murmurs, rubs or bruits. Abdomen is soft. He has good bowel sounds. There is no fluid wave. There is no palpable liver or spleen tip. Back exam shows some kyphosis. Extremities shows some age-related osteoarthritic changes. He has decent range of motion of his joints. Skin exam shows no rashes, ecchymoses or petechia.  Lab Results  Component Value Date   WBC 4.3 09/23/2015   HGB 8.5* 09/23/2015   HCT 25.0* 09/23/2015   MCV 98 09/23/2015   PLT 350 09/23/2015     Chemistry      Component Value Date/Time   NA 129* 09/01/2015 1048   NA 134* 08/11/2015 0928   NA 134 05/17/2015 0936   K 4.7 09/01/2015 1048   K 4.2 08/11/2015 0928   K 4.5 05/17/2015 0936    CL 102 08/11/2015 0928   CL 103 05/17/2015 0936   CO2 23 09/01/2015 1048   CO2 24 08/11/2015 0928   CO2 27 05/17/2015 0936   BUN 19.8 09/01/2015 1048   BUN 14 08/11/2015 0928   BUN 11 05/17/2015 0936   CREATININE 0.9 09/01/2015 1048   CREATININE 1.03 08/11/2015 0928   CREATININE 1.0 05/17/2015 0936      Component Value Date/Time   CALCIUM 8.7 09/01/2015 1048   CALCIUM 8.8 08/11/2015 0928   CALCIUM 8.8 05/17/2015 0936   ALKPHOS 47 09/01/2015 1048   ALKPHOS 42 08/11/2015 0928   ALKPHOS 43 05/17/2015 0936   AST 19 09/01/2015 1048   AST 18 08/11/2015 0928   AST 26 05/17/2015 0936   ALT 16 09/01/2015 1048   ALT 15 08/11/2015 0928   ALT 25 05/17/2015 0936   BILITOT 1.07 09/01/2015 1048   BILITOT 1.0 08/11/2015 0928   BILITOT 1.10 05/17/2015 0936         Impression and Plan: [name] is 81 year old gentleman with myelodysplasia. He has refractory anemia with ringed sideroblasts.  a I will go ahead and give him Procrit today.  We will still have him come back every 3 weeks for a CBC and possible Procrit.  I would like to try to keep his hemoglobin above 10 if possible.   We will see him back in 3 weeks.    [name], MD 11/4/20161:09 PM

## 2015-09-26 LAB — FERRITIN CHCC: FERRITIN: 692 ng/mL — AB (ref 22–316)

## 2015-09-26 LAB — IRON AND TIBC CHCC
%SAT: >100 % (ref 20–?)
Iron: 209 ug/dL — ABNORMAL HIGH (ref 42–163)
TIBC: 205 ug/dL (ref 202–409)
UIBC: <1 ug/dL (ref 117–376)

## 2015-10-14 ENCOUNTER — Ambulatory Visit: Payer: Medicare Other | Admitting: Family

## 2015-10-14 ENCOUNTER — Other Ambulatory Visit: Payer: Medicare Other

## 2015-10-14 ENCOUNTER — Ambulatory Visit: Payer: Medicare Other

## 2015-10-21 ENCOUNTER — Other Ambulatory Visit (HOSPITAL_BASED_OUTPATIENT_CLINIC_OR_DEPARTMENT_OTHER): Payer: Medicare Other

## 2015-10-21 ENCOUNTER — Ambulatory Visit (HOSPITAL_BASED_OUTPATIENT_CLINIC_OR_DEPARTMENT_OTHER): Payer: Medicare Other | Admitting: Family

## 2015-10-21 ENCOUNTER — Ambulatory Visit (HOSPITAL_BASED_OUTPATIENT_CLINIC_OR_DEPARTMENT_OTHER): Payer: Medicare Other

## 2015-10-21 VITALS — BP 182/61 | HR 56 | Temp 97.6°F | Resp 18 | Ht 67.0 in | Wt 152.0 lb

## 2015-10-21 DIAGNOSIS — D469 Myelodysplastic syndrome, unspecified: Secondary | ICD-10-CM

## 2015-10-21 DIAGNOSIS — D461 Refractory anemia with ring sideroblasts: Secondary | ICD-10-CM

## 2015-10-21 DIAGNOSIS — D510 Vitamin B12 deficiency anemia due to intrinsic factor deficiency: Secondary | ICD-10-CM

## 2015-10-21 LAB — COMPREHENSIVE METABOLIC PANEL
ALT: 15 U/L (ref 0–55)
ANION GAP: 7 meq/L (ref 3–11)
AST: 21 U/L (ref 5–34)
Albumin: 4 g/dL (ref 3.5–5.0)
Alkaline Phosphatase: 46 U/L (ref 40–150)
BILIRUBIN TOTAL: 0.92 mg/dL (ref 0.20–1.20)
BUN: 14.8 mg/dL (ref 7.0–26.0)
CHLORIDE: 99 meq/L (ref 98–109)
CO2: 23 meq/L (ref 22–29)
Calcium: 8.9 mg/dL (ref 8.4–10.4)
Creatinine: 1 mg/dL (ref 0.7–1.3)
EGFR: 68 mL/min/{1.73_m2} — AB (ref >90)
GLUCOSE: 94 mg/dL (ref 70–140)
POTASSIUM: 4.3 meq/L (ref 3.5–5.1)
SODIUM: 129 meq/L — AB (ref 136–145)
Total Protein: 6 g/dL — ABNORMAL LOW (ref 6.4–8.3)

## 2015-10-21 LAB — IRON AND TIBC
%SAT: 49 % (ref 20–55)
IRON: 104 ug/dL (ref 42–163)
TIBC: 211 ug/dL (ref 202–409)
UIBC: 107 ug/dL — AB (ref 117–376)

## 2015-10-21 LAB — CBC WITH DIFFERENTIAL (CANCER CENTER ONLY)
BASO#: 0 10*3/uL (ref 0.0–0.2)
BASO%: 0.2 % (ref 0.0–2.0)
EOS%: 1 % (ref 0.0–7.0)
Eosinophils Absolute: 0 10*3/uL (ref 0.0–0.5)
HCT: 24.9 % — ABNORMAL LOW (ref 38.7–49.9)
HGB: 8.7 g/dL — ABNORMAL LOW (ref 13.0–17.1)
LYMPH#: 1.1 10*3/uL (ref 0.9–3.3)
LYMPH%: 26.6 % (ref 14.0–48.0)
MCH: 34.3 pg — ABNORMAL HIGH (ref 28.0–33.4)
MCHC: 34.9 g/dL (ref 32.0–35.9)
MCV: 98 fL (ref 82–98)
MONO#: 0.4 10*3/uL (ref 0.1–0.9)
MONO%: 9.9 % (ref 0.0–13.0)
NEUT#: 2.6 10*3/uL (ref 1.5–6.5)
NEUT%: 62.3 % (ref 40.0–80.0)
Platelets: 375 10*3/uL (ref 145–400)
RBC: 2.54 10*6/uL — ABNORMAL LOW (ref 4.20–5.70)
RDW: 17.6 % — ABNORMAL HIGH (ref 11.1–15.7)
WBC: 4.1 10*3/uL (ref 4.0–10.0)

## 2015-10-21 LAB — RETICULOCYTES
ABS RETIC: 21.4 10*3/uL (ref 19.0–186.0)
RBC.: 2.67 MIL/uL — AB (ref 4.22–5.81)
RETIC CT PCT: 0.8 % (ref 0.4–2.3)

## 2015-10-21 LAB — FERRITIN: Ferritin: 868 ng/ml — ABNORMAL HIGH (ref 22–316)

## 2015-10-21 LAB — CHCC SATELLITE - SMEAR

## 2015-10-21 MED ORDER — EPOETIN ALFA 40000 UNIT/ML IJ SOLN
INTRAMUSCULAR | Status: AC
  Filled 2015-10-21: qty 1

## 2015-10-21 MED ORDER — EPOETIN ALFA 40000 UNIT/ML IJ SOLN
40000.0000 [IU] | Freq: Once | INTRAMUSCULAR | Status: AC
  Administered 2015-10-21: 40000 [IU] via SUBCUTANEOUS

## 2015-10-21 NOTE — Progress Notes (Signed)
Hematology and Oncology Follow Up Visit  Alexander Rice [id number] 1932/07/01 81 y.o. 10/21/2015   Principle Diagnosis:  Anemia of renal insufficiency  Refractory anemia with ringed sideroblasts. Pernicious anemia  Current Therapy:   Procrit 40,000 units subcutaneous monthly Vitamin B6 250 mg by mouth daily Vitamin B-12 1 mg IM every month - has not been getting these recently    Interim History: Alexander Rice is here today with is wife and daughter for a follow-up. He is doing well and has no complaints at this time. He had a wonderful Thanksgiving with his family.  No c/o fatigue. His Hgb is holding at 8.7 with an MCV of 98.  He has had no episodes of bleeding or bruising. No lymphadenopathy found on assessment.  No fever, chills, cough, rash, headache, dizziness, vision changes, SOB, chest pain, palpitations, abdominal pain or changes in bowel or bladder habits.  No c/o joint aches or pains. He has no swelling or tenderness in his extremities.  He is eating well and staying hydrated. His weight is down 4 lbs since his last visit. He is still staying active walking during the day.  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   Medications:    Medication List       This list is accurate as of: 10/21/15 12:01 PM.  Always use your most recent med list.               aspirin 81 MG tablet  Take 81 mg by mouth daily.     CVS OMEGA-3 KRILL OIL PO  Take 350 mg by mouth daily.     [facility] PROBIOTIC Chew  Chew by mouth daily.     donepezil 10 MG tablet  Commonly known as:  ARICEPT  Take 1 tablet daily     dorzolamide 2 % ophthalmic solution  Commonly known as:  TRUSOPT  Place 1 drop into both eyes 2 (two) times daily.     doxazosin 8 MG tablet  Commonly known as:  CARDURA  Take 1 tablet by mouth at  bedtime     isosorbide mononitrate 30 MG 24 hr tablet  Commonly known as:  IMDUR  Take 1  tablet by mouth  (30mg ) daily     LUMIGAN 0.01 % Soln  Generic drug:  bimatoprost  Place 1 drop into both eyes at bedtime.     multivitamin tablet  Take 1 tablet by mouth daily.     nitroGLYCERIN 0.4 MG SL tablet  Commonly known as:  NITROSTAT  Place 0.4 mg under the tongue every 5 (five) minutes as needed.     omeprazole 20 MG capsule  Commonly known as:  PRILOSEC     simvastatin 40 MG tablet  Commonly known as:  ZOCOR  TAKE 1 TABLET BY MOUTH  DAILY AT 6 PM     VESICARE 10 MG tablet  Generic drug:  solifenacin  Take 10 mg by mouth daily.     vitamin B-6 250 MG tablet  Take 1 tablet (250 mg total) by mouth daily.        Allergies: No Known Allergies  Past Medical History, Surgical history, Social history, and Family History were reviewed and updated.  Review of Systems: All other 10 point review of systems is negative.   Physical Exam:  height is 5\' 7"  (1.702 m) and weight is 152 lb (68.947 kg). His oral temperature  is 97.6 F (36.4 C). His blood pressure is 182/61 and his pulse is 56. His respiration is 18.   Wt Readings from Last 3 Encounters:  10/21/15 152 lb (68.947 kg)  09/23/15 156 lb (70.761 kg)  08/11/15 149 lb (67.586 kg)    Ocular: Sclerae unicteric, pupils equal, round and reactive to light Ear-nose-throat: Oropharynx clear, dentition fair Lymphatic: No cervical or supraclavicular adenopathy Lungs no rales or rhonchi, good excursion bilaterally Heart regular rate and rhythm, no murmur appreciated Abd soft, nontender, positive bowel sounds, no organomegaly  MSK no focal spinal tenderness, no joint edema Neuro: non-focal, well-oriented, appropriate affect  Lab Results  Component Value Date   WBC 4.1 10/21/2015   HGB 8.7* 10/21/2015   HCT 24.9* 10/21/2015   MCV 98 10/21/2015   PLT 375 10/21/2015   Lab Results  Component Value Date   FERRITIN 692* 09/23/2015   IRON 209* 09/23/2015   TIBC 205 09/23/2015   UIBC <1.0 09/23/2015   IRONPCTSAT  >100 09/23/2015   Lab Results  Component Value Date   RETICCTPCT 1.0 09/23/2015   RBC 2.54* 10/21/2015   RETICCTABS 26.4 09/23/2015   Lab Results  Component Value Date   KPAFRELGTCHN 3.09* 04/22/2008   LAMBDASER 1.01 04/22/2008   KAPLAMBRATIO 3.06* 04/22/2008   No results found for: Kandis Cocking, IGMSERUM Lab Results  Component Value Date   TOTALPROTELP 6.3 04/22/2008   ALBUMINELP 66.3* 04/22/2008   A1GS 3.9 04/22/2008   A2GS 8.1 04/22/2008   BETS 5.2 04/22/2008   BETA2SER 4.0 04/22/2008   GAMS 12.5 04/22/2008   MSPIKE NOT DET 04/22/2008   SPEI * 04/22/2008     Chemistry      Component Value Date/Time   NA 127* 09/23/2015 1145   NA 134* 08/11/2015 0928   NA 134 05/17/2015 0936   K 4.8 09/23/2015 1145   K 4.2 08/11/2015 0928   K 4.5 05/17/2015 0936   CL 102 08/11/2015 0928   CL 103 05/17/2015 0936   CO2 23 09/23/2015 1145   CO2 24 08/11/2015 0928   CO2 27 05/17/2015 0936   BUN 20.8 09/23/2015 1145   BUN 14 08/11/2015 0928   BUN 11 05/17/2015 0936   CREATININE 0.9 09/23/2015 1145   CREATININE 1.03 08/11/2015 0928   CREATININE 1.0 05/17/2015 0936      Component Value Date/Time   CALCIUM 8.9 09/23/2015 1145   CALCIUM 8.8 08/11/2015 0928   CALCIUM 8.8 05/17/2015 0936   ALKPHOS 45 09/23/2015 1145   ALKPHOS 42 08/11/2015 0928   ALKPHOS 43 05/17/2015 0936   AST 22 09/23/2015 1145   AST 18 08/11/2015 0928   AST 26 05/17/2015 0936   ALT 15 09/23/2015 1145   ALT 15 08/11/2015 0928   ALT 25 05/17/2015 0936   BILITOT 0.90 09/23/2015 1145   BILITOT 1.0 08/11/2015 0928   BILITOT 1.10 05/17/2015 0936     Impression and Plan: Alexander Rice is 81 yo white male with myelodysplasia and refractory anemia with ringed sideroblasts. He is doing well and has no complaints at this time. His Hgb is stable at 8.7.  We will give him his Procrit injection today.  He plans to follow-up with his PCP regarding his B 12 injections.  We will plan to see him back in 1 month for  follow-up.   He will contact us with any questions or concerns. We can certainly see him sooner if need be.   Eliezer Bottom, NP 12/2/201612:01 PM

## 2015-10-21 NOTE — Patient Instructions (Signed)
Epoetin Alfa injection What is this medicine? EPOETIN ALFA (e POE e tin AL fa) helps your body make more red blood cells. This medicine is used to treat anemia caused by chronic kidney failure, cancer chemotherapy, or HIV-therapy. It may also be used before surgery if you have anemia. This medicine may be used for other purposes; ask your health care provider or pharmacist if you have questions. What should I tell my health care provider before I take this medicine? They need to know if you have any of these conditions: -blood clotting disorders -cancer patient not on chemotherapy -cystic fibrosis -heart disease, such as angina or heart failure -hemoglobin level of 12 g/dL or greater -high blood pressure -low levels of folate, iron, or vitamin B12 -seizures -an unusual or allergic reaction to erythropoietin, albumin, benzyl alcohol, hamster proteins, other medicines, foods, dyes, or preservatives -pregnant or trying to get pregnant -breast-feeding How should I use this medicine? This medicine is for injection into a vein or under the skin. It is usually given by a health care professional in a hospital or clinic setting. If you get this medicine at home, you will be taught how to prepare and give this medicine. Use exactly as directed. Take your medicine at regular intervals. Do not take your medicine more often than directed. It is important that you put your used needles and syringes in a special sharps container. Do not put them in a trash can. If you do not have a sharps container, call your pharmacist or healthcare provider to get one. Talk to your pediatrician regarding the use of this medicine in children. While this drug may be prescribed for selected conditions, precautions do apply. Overdosage: If you think you have taken too much of this medicine contact a poison control center or emergency room at once. NOTE: This medicine is only for you. Do not share this medicine with  others. What if I miss a dose? If you miss a dose, take it as soon as you can. If it is almost time for your next dose, take only that dose. Do not take double or extra doses. What may interact with this medicine? Do not take this medicine with any of the following medications: -darbepoetin alfa This list may not describe all possible interactions. Give your health care provider a list of all the medicines, herbs, non-prescription drugs, or dietary supplements you use. Also tell them if you smoke, drink alcohol, or use illegal drugs. Some items may interact with your medicine. What should I watch for while using this medicine? Visit your prescriber or health care professional for regular checks on your progress and for the needed blood tests and blood pressure measurements. It is especially important for the doctor to make sure your hemoglobin level is in the desired range, to limit the risk of potential side effects and to give you the best benefit. Keep all appointments for any recommended tests. Check your blood pressure as directed. Ask your doctor what your blood pressure should be and when you should contact him or her. As your body makes more red blood cells, you may need to take iron, folic acid, or vitamin B supplements. Ask your doctor or health care provider which products are right for you. If you have kidney disease continue dietary restrictions, even though this medication can make you feel better. Talk with your doctor or health care professional about the foods you eat and the vitamins that you take. What side effects may I notice   from receiving this medicine? Side effects that you should report to your doctor or health care professional as soon as possible: -allergic reactions like skin rash, itching or hives, swelling of the face, lips, or tongue -breathing problems -changes in vision -chest pain -confusion, trouble speaking or understanding -feeling faint or lightheaded,  falls -high blood pressure -muscle aches or pains -pain, swelling, warmth in the leg -rapid weight gain -severe headaches -sudden numbness or weakness of the face, arm or leg -trouble walking, dizziness, loss of balance or coordination -seizures (convulsions) -swelling of the ankles, feet, hands -unusually weak or tired Side effects that usually do not require medical attention (report to your doctor or health care professional if they continue or are bothersome): -diarrhea -fever, chills (flu-like symptoms) -headaches -nausea, vomiting -redness, stinging, or swelling at site where injected This list may not describe all possible side effects. Call your doctor for medical advice about side effects. You may report side effects to FDA at 1-800-FDA-1088. Where should I keep my medicine? Keep out of the reach of children. Store in a refrigerator between 2 and 8 degrees C (36 and 46 degrees F). Do not freeze or shake. Throw away any unused portion if using a single-dose vial. Multi-dose vials can be kept in the refrigerator for up to 21 days after the initial dose. Throw away unused medicine. NOTE: This sheet is a summary. It may not cover all possible information. If you have questions about this medicine, talk to your doctor, pharmacist, or health care provider.    2016, [employer] Standard. (2008-10-19 10:25:44)  

## 2015-11-03 ENCOUNTER — Ambulatory Visit: Payer: Medicare Other | Admitting: Podiatry

## 2015-11-09 ENCOUNTER — Telehealth: Payer: Self-pay | Admitting: Internal Medicine

## 2015-11-09 NOTE — Telephone Encounter (Signed)
Attempted to contact patient to schedule follow up visit in 04/2016 with Dr. Melvyn Novas. Left message for patient to call back.

## 2015-11-10 NOTE — Telephone Encounter (Signed)
LMTCB for pt 

## 2015-11-11 NOTE — Telephone Encounter (Signed)
Patient scheduled to see Dr. Melvyn Novas for follow up on 04/23/16 at 11am. Patient aware of appointment. Nothing further needed.

## 2015-11-25 ENCOUNTER — Other Ambulatory Visit (HOSPITAL_BASED_OUTPATIENT_CLINIC_OR_DEPARTMENT_OTHER): Payer: Medicare Other

## 2015-11-25 ENCOUNTER — Encounter: Payer: Self-pay | Admitting: Hematology & Oncology

## 2015-11-25 ENCOUNTER — Ambulatory Visit (HOSPITAL_BASED_OUTPATIENT_CLINIC_OR_DEPARTMENT_OTHER): Payer: Medicare Other | Admitting: Hematology & Oncology

## 2015-11-25 ENCOUNTER — Ambulatory Visit (HOSPITAL_BASED_OUTPATIENT_CLINIC_OR_DEPARTMENT_OTHER): Payer: Medicare Other

## 2015-11-25 VITALS — BP 157/57 | HR 59 | Temp 97.5°F | Resp 16 | Ht 67.0 in | Wt 151.0 lb

## 2015-11-25 DIAGNOSIS — D469 Myelodysplastic syndrome, unspecified: Secondary | ICD-10-CM

## 2015-11-25 DIAGNOSIS — D461 Refractory anemia with ring sideroblasts: Secondary | ICD-10-CM

## 2015-11-25 DIAGNOSIS — D510 Vitamin B12 deficiency anemia due to intrinsic factor deficiency: Secondary | ICD-10-CM

## 2015-11-25 DIAGNOSIS — M1990 Unspecified osteoarthritis, unspecified site: Secondary | ICD-10-CM

## 2015-11-25 LAB — CBC WITH DIFFERENTIAL (CANCER CENTER ONLY)
BASO#: 0 10*3/uL (ref 0.0–0.2)
BASO%: 0.2 % (ref 0.0–2.0)
EOS%: 1.7 % (ref 0.0–7.0)
Eosinophils Absolute: 0.1 10*3/uL (ref 0.0–0.5)
HCT: 25.4 % — ABNORMAL LOW (ref 38.7–49.9)
HGB: 8.7 g/dL — ABNORMAL LOW (ref 13.0–17.1)
LYMPH#: 0.9 10*3/uL (ref 0.9–3.3)
LYMPH%: 23.3 % (ref 14.0–48.0)
MCH: 33.1 pg (ref 28.0–33.4)
MCHC: 34.3 g/dL (ref 32.0–35.9)
MCV: 97 fL (ref 82–98)
MONO#: 0.4 10*3/uL (ref 0.1–0.9)
MONO%: 9.7 % (ref 0.0–13.0)
NEUT#: 2.6 10*3/uL (ref 1.5–6.5)
NEUT%: 65.1 % (ref 40.0–80.0)
Platelets: 396 10*3/uL (ref 145–400)
RBC: 2.63 10*6/uL — AB (ref 4.20–5.70)
RDW: 17.5 % — AB (ref 11.1–15.7)
WBC: 4 10*3/uL (ref 4.0–10.0)

## 2015-11-25 LAB — IRON AND TIBC
%SAT: 49 % (ref 20–55)
IRON: 96 ug/dL (ref 42–163)
TIBC: 195 ug/dL — ABNORMAL LOW (ref 202–409)
UIBC: 99 ug/dL — AB (ref 117–376)

## 2015-11-25 LAB — COMPREHENSIVE METABOLIC PANEL
ALBUMIN: 3.8 g/dL (ref 3.5–5.0)
ALK PHOS: 48 U/L (ref 40–150)
ALT: 14 U/L (ref 0–55)
ANION GAP: 6 meq/L (ref 3–11)
AST: 19 U/L (ref 5–34)
BUN: 14.9 mg/dL (ref 7.0–26.0)
CALCIUM: 8.4 mg/dL (ref 8.4–10.4)
CO2: 22 mEq/L (ref 22–29)
Chloride: 98 mEq/L (ref 98–109)
Creatinine: 1 mg/dL (ref 0.7–1.3)
EGFR: 75 mL/min/{1.73_m2} — AB (ref >90)
Glucose: 97 mg/dl (ref 70–140)
POTASSIUM: 4.6 meq/L (ref 3.5–5.1)
Sodium: 127 mEq/L — ABNORMAL LOW (ref 136–145)
Total Bilirubin: 1.03 mg/dL (ref 0.20–1.20)
Total Protein: 5.6 g/dL — ABNORMAL LOW (ref 6.4–8.3)

## 2015-11-25 LAB — FERRITIN: Ferritin: 820 ng/ml — ABNORMAL HIGH (ref 22–316)

## 2015-11-25 LAB — RETICULOCYTES
ABS RETIC: 27.2 10*3/uL (ref 19.0–186.0)
RBC.: 2.72 MIL/uL — ABNORMAL LOW (ref 4.22–5.81)
Retic Ct Pct: 1 % (ref 0.4–2.3)

## 2015-11-25 LAB — CHCC SATELLITE - SMEAR

## 2015-11-25 MED ORDER — EPOETIN ALFA 40000 UNIT/ML IJ SOLN
40000.0000 [IU] | Freq: Once | INTRAMUSCULAR | Status: AC
  Administered 2015-11-25: 40000 [IU] via SUBCUTANEOUS

## 2015-11-25 MED ORDER — EPOETIN ALFA 40000 UNIT/ML IJ SOLN
INTRAMUSCULAR | Status: AC
  Filled 2015-11-25: qty 1

## 2015-11-25 NOTE — Progress Notes (Signed)
Hematology and Oncology Follow Up Visit  Alexander Rice [id number] 09/26/1932 82 y.o. 11/25/2015   Principle Diagnosis:   Refractory anemia with ringed sideroblasts.  Pernicious anemia  Severe osteoarthritis  Current Therapy:    Procrit 40,000 units subcutaneous every 3 weeks  Vitamin B6 250 mg by mouth daily  Vitamin B-12 1 mg IM every month-done at home     Interim History:  Alexander Rice is back for follow-up. He had ay good Christmas.unfortunately, his overall health is declining because of arthritis. He uses a cane now tell get around. He is a little more unstable on his feet.  He's had no issues with bleeding. He's had no change in bowel bladder habits. He's had no cough. He's had no shortness of breath.  He's had no leg swelling.  He does his vitamin B-12 at home.  His last iron studies done back in December showed a ferritin of 860 with iron saturation of 49%.   Overall, his performance status is ECOG 2.  Medications:  Current outpatient prescriptions:  .  aspirin 81 MG tablet, Take 81 mg by mouth daily.  , Disp: , Rfl:  .  [employer] OMEGA-3 KRILL OIL PO, Take 350 mg by mouth daily., Disp: , Rfl:  .  donepezil (ARICEPT) 10 MG tablet, Take 1 tablet daily, Disp: 90 tablet, Rfl: 3 .  dorzolamide (TRUSOPT) 2 % ophthalmic solution, Place 1 drop into both eyes 2 (two) times daily.  , Disp: , Rfl:  .  doxazosin (CARDURA) 8 MG tablet, Take 1 tablet by mouth at  bedtime, Disp: 90 tablet, Rfl: 3 .  isosorbide mononitrate (IMDUR) 30 MG 24 hr tablet, Take 1 tablet by mouth  (30mg ) daily, Disp: 90 tablet, Rfl: 2 .  LUMIGAN 0.01 % SOLN, Place 1 drop into both eyes at bedtime. , Disp: , Rfl:  .  Multiple Vitamin (MULTIVITAMIN) tablet, Take 1 tablet by mouth daily.  , Disp: , Rfl:  .  nitroGLYCERIN (NITROSTAT) 0.4 MG SL tablet, Place 0.4 mg under the tongue every 5 (five) minutes as needed.  , Disp: , Rfl:  .  omeprazole (PRILOSEC) 20 MG capsule, , Disp: , Rfl:  .  Probiotic  Product (CVS PROBIOTIC) CHEW, Chew by mouth daily., Disp: , Rfl:  .  Pyridoxine HCl (VITAMIN B-6) 250 MG tablet, Take 1 tablet (250 mg total) by mouth daily., Disp: 90 tablet, Rfl: 3 .  simvastatin (ZOCOR) 40 MG tablet, TAKE 1 TABLET BY MOUTH  DAILY AT 6 PM, Disp: 90 tablet, Rfl: 2 .  VESICARE 10 MG tablet, Take 10 mg by mouth daily. , Disp: , Rfl:  No current facility-administered medications for this visit.  Facility-Administered Medications Ordered in Other Visits:  .  darbepoetin (ARANESP) injection 300 mcg, 300 mcg, Subcutaneous, Once, [name], MD  Allergies: No Known Allergies  Past Medical History, Surgical history, Social history, and Family History were reviewed and updated.  Review of Systems: As above  Physical Exam:  height is 5\' 7"  (1.702 m) and weight is 151 lb (68.493 kg). His oral temperature is 97.5 F (36.4 C). His blood pressure is 157/57 and his pulse is 59. His respiration is 16.   Wt Readings from Last 3 Encounters:  11/25/15 151 lb (68.493 kg)  10/21/15 152 lb (68.947 kg)  09/23/15 156 lb (70.761 kg)     Elderly white gentleman in no obvious distress. Head and neck exam shows no ocular or oral lesions. There are no palpable cervical or supraclavicular  lymph nodes. Lungs are clear. Cardiac exam regular rate and rhythm with no murmurs, rubs or bruits. Abdomen is soft. He has good bowel sounds. There is no fluid wave. There is no palpable liver or spleen tip. Back exam shows some kyphosis. Extremities shows some age-related osteoarthritic changes. He has decent range of motion of his joints. Skin exam shows no rashes, ecchymoses or petechia.  Lab Results  Component Value Date   WBC 4.0 11/25/2015   HGB 8.7* 11/25/2015   HCT 25.4* 11/25/2015   MCV 97 11/25/2015   PLT 396 11/25/2015     Chemistry      Component Value Date/Time   NA 129* 10/21/2015 1033   NA 134* 08/11/2015 0928   NA 134 05/17/2015 0936   K 4.3 10/21/2015 1033   K 4.2 08/11/2015  0928   K 4.5 05/17/2015 0936   CL 102 08/11/2015 0928   CL 103 05/17/2015 0936   CO2 23 10/21/2015 1033   CO2 24 08/11/2015 0928   CO2 27 05/17/2015 0936   BUN 14.8 10/21/2015 1033   BUN 14 08/11/2015 0928   BUN 11 05/17/2015 0936   CREATININE 1.0 10/21/2015 1033   CREATININE 1.03 08/11/2015 0928   CREATININE 1.0 05/17/2015 0936      Component Value Date/Time   CALCIUM 8.9 10/21/2015 1033   CALCIUM 8.8 08/11/2015 0928   CALCIUM 8.8 05/17/2015 0936   ALKPHOS 46 10/21/2015 1033   ALKPHOS 42 08/11/2015 0928   ALKPHOS 43 05/17/2015 0936   AST 21 10/21/2015 1033   AST 18 08/11/2015 0928   AST 26 05/17/2015 0936   ALT 15 10/21/2015 1033   ALT 15 08/11/2015 0928   ALT 25 05/17/2015 0936   BILITOT 0.92 10/21/2015 1033   BILITOT 1.0 08/11/2015 0928   BILITOT 1.10 05/17/2015 0936         Impression and Plan: Alexander Rice is 82 year old gentleman with myelodysplasia. He has refractory anemia with ringed sideroblasts.   I will go ahead and give him Procrit today.  We will still have him come back every 3 weeks for a CBC and possible Procrit.  I would like to try to keep his hemoglobin above 10, if possible.   We will see him back in 3 weeks.    [name], MD 1/6/201711:18 AM

## 2015-11-25 NOTE — Patient Instructions (Signed)
Epoetin Alfa injection What is this medicine? EPOETIN ALFA (e POE e tin AL fa) helps your body make more red blood cells. This medicine is used to treat anemia caused by chronic kidney failure, cancer chemotherapy, or HIV-therapy. It may also be used before surgery if you have anemia. This medicine may be used for other purposes; ask your health care provider or pharmacist if you have questions. What should I tell my health care provider before I take this medicine? They need to know if you have any of these conditions: -blood clotting disorders -cancer patient not on chemotherapy -cystic fibrosis -heart disease, such as angina or heart failure -hemoglobin level of 12 g/dL or greater -high blood pressure -low levels of folate, iron, or vitamin B12 -seizures -an unusual or allergic reaction to erythropoietin, albumin, benzyl alcohol, hamster proteins, other medicines, foods, dyes, or preservatives -pregnant or trying to get pregnant -breast-feeding How should I use this medicine? This medicine is for injection into a vein or under the skin. It is usually given by a health care professional in a hospital or clinic setting. If you get this medicine at home, you will be taught how to prepare and give this medicine. Use exactly as directed. Take your medicine at regular intervals. Do not take your medicine more often than directed. It is important that you put your used needles and syringes in a special sharps container. Do not put them in a trash can. If you do not have a sharps container, call your pharmacist or healthcare provider to get one. Talk to your pediatrician regarding the use of this medicine in children. While this drug may be prescribed for selected conditions, precautions do apply. Overdosage: If you think you have taken too much of this medicine contact a poison control center or emergency room at once. NOTE: This medicine is only for you. Do not share this medicine with  others. What if I miss a dose? If you miss a dose, take it as soon as you can. If it is almost time for your next dose, take only that dose. Do not take double or extra doses. What may interact with this medicine? Do not take this medicine with any of the following medications: -darbepoetin alfa This list may not describe all possible interactions. Give your health care provider a list of all the medicines, herbs, non-prescription drugs, or dietary supplements you use. Also tell them if you smoke, drink alcohol, or use illegal drugs. Some items may interact with your medicine. What should I watch for while using this medicine? Visit your prescriber or health care professional for regular checks on your progress and for the needed blood tests and blood pressure measurements. It is especially important for the doctor to make sure your hemoglobin level is in the desired range, to limit the risk of potential side effects and to give you the best benefit. Keep all appointments for any recommended tests. Check your blood pressure as directed. Ask your doctor what your blood pressure should be and when you should contact him or her. As your body makes more red blood cells, you may need to take iron, folic acid, or vitamin B supplements. Ask your doctor or health care provider which products are right for you. If you have kidney disease continue dietary restrictions, even though this medication can make you feel better. Talk with your doctor or health care professional about the foods you eat and the vitamins that you take. What side effects may I notice   from receiving this medicine? Side effects that you should report to your doctor or health care professional as soon as possible: -allergic reactions like skin rash, itching or hives, swelling of the face, lips, or tongue -breathing problems -changes in vision -chest pain -confusion, trouble speaking or understanding -feeling faint or lightheaded,  falls -high blood pressure -muscle aches or pains -pain, swelling, warmth in the leg -rapid weight gain -severe headaches -sudden numbness or weakness of the face, arm or leg -trouble walking, dizziness, loss of balance or coordination -seizures (convulsions) -swelling of the ankles, feet, hands -unusually weak or tired Side effects that usually do not require medical attention (report to your doctor or health care professional if they continue or are bothersome): -diarrhea -fever, chills (flu-like symptoms) -headaches -nausea, vomiting -redness, stinging, or swelling at site where injected This list may not describe all possible side effects. Call your doctor for medical advice about side effects. You may report side effects to FDA at 1-800-FDA-1088. Where should I keep my medicine? Keep out of the reach of children. Store in a refrigerator between 2 and 8 degrees C (36 and 46 degrees F). Do not freeze or shake. Throw away any unused portion if using a single-dose vial. Multi-dose vials can be kept in the refrigerator for up to 21 days after the initial dose. Throw away unused medicine. NOTE: This sheet is a summary. It may not cover all possible information. If you have questions about this medicine, talk to your doctor, pharmacist, or health care provider.    2016, [employer] Standard. (2008-10-19 10:25:44)  

## 2015-11-29 ENCOUNTER — Other Ambulatory Visit: Payer: Self-pay | Admitting: Family Medicine

## 2015-12-01 ENCOUNTER — Ambulatory Visit: Payer: Medicare Other | Admitting: Podiatry

## 2015-12-08 ENCOUNTER — Other Ambulatory Visit: Payer: Self-pay | Admitting: Family Medicine

## 2015-12-08 DIAGNOSIS — H401131 Primary open-angle glaucoma, bilateral, mild stage: Secondary | ICD-10-CM | POA: Diagnosis not present

## 2015-12-12 ENCOUNTER — Ambulatory Visit (INDEPENDENT_AMBULATORY_CARE_PROVIDER_SITE_OTHER): Payer: Medicare Other | Admitting: Neurology

## 2015-12-12 ENCOUNTER — Encounter: Payer: Self-pay | Admitting: Neurology

## 2015-12-12 VITALS — BP 170/84 | HR 48 | Resp 14 | Wt 151.0 lb

## 2015-12-12 DIAGNOSIS — F0390 Unspecified dementia without behavioral disturbance: Secondary | ICD-10-CM

## 2015-12-12 DIAGNOSIS — F03B0 Unspecified dementia, moderate, without behavioral disturbance, psychotic disturbance, mood disturbance, and anxiety: Secondary | ICD-10-CM | POA: Insufficient documentation

## 2015-12-12 NOTE — Progress Notes (Signed)
NEUROLOGY FOLLOW UP OFFICE NOTE  REDA MANZOOR [id number]  HISTORY OF PRESENT ILLNESS: I had the pleasure of seeing Alexander Rice in follow-up in the neurology clinic on 12/12/2015.  The patient was last seen 6 months ago for worsening memory. MMSE in April 2016 was 21/30, indicating mild dementia. He is again accompanied by his daughter and wife who help supplement the history today. His family requested for an earlier visit due to worsening memory. Around 2 weeks ago, he got agitated why his family had not visited, when they in fact did. His family was there when he was loudly calling his wife's name at midnight and kept saying "lights." He showed her that he was talking about the TV, which she fixed for him. Yesterday he went to bed early, then at 11pm his wife heard the garage door open. She saw him coming back in, he said he was going to get the paper but saw it was raining hard and went back inside. She then woke up at 6am to find him dressed in front of the TV. He is able to dress and bathe independently, but his wife picks out his clothes, otherwise he would wear inappropriate clothing such as shorts in the winter. They have to remind him to shower, otherwise he would not think to do it. His wife has been administering his medications for the past 2-3 weeks after he had fallen in his room and they found that he had taken 2 days worth of medication at the same time. His room was in disarray and he had urinated on himself. He has fallen twice in the past 6 months, neighbors had to be called in to help his wife get him up. He denies any headaches, dizziness, diplopia, dysarthria, dysphagia, neck pain, focal numbness/tingling/weakness, bowel/bladder dysfunction, anosmia, tremors. Appetite is good but family is concerned about weight loss. He is not driving. No personality changes. He is tolerating Aricept 10mg  daily without side effects.  HPI 03/11/15 : This is a pleasant 82 yo RH man with a  history of hypertension, hyperlipidemia, low-grade myelodysplasia, refractory anemia, pernicious anemia, with worsening memory loss. He feels that his memory is pretty good. He states he does a lot of work for the [employer] and denies having any difficulties with his duties there. He does note that he misplaces things and has had word-finding difficulties. He got lost driving 6 months ago but would not elaborate. He denied any missed bills or missed medications. His wife and daughter, on the other hand, report more concerning memory changes over the past couple of years. He would start in the middle of a conversation and she would have to ask what he is talking about. He has noticeable word-finding difficulties, and sometimes does not seem to understand conversations. His wife is concerned about his driving, they got lost 6 months ago and were out of 3 hours. He sometimes drives/drifts to the median, or sometimes comes to a complete stop. He would get upset with her if she comments. He frequently misplaces things, his cell phone, credit card, one time he did not remember where he parked, and they reported the car stolen until he came back the next day and found the car. He repeats himself. He has had some problems with late bill payments. They deny any personality changes. No family history of memory loss, no significant head injuries. He drinks wine daily and beer occasionally, reporting that this is less than before.   PAST  MEDICAL HISTORY: Past Medical History  Diagnosis Date  . CAD (coronary artery disease)   . GERD (gastroesophageal reflux disease)   . Hyperlipidemia   . Hypertension   . BPH (benign prostatic hyperplasia)   . CVD (cardiovascular disease)   . Adenomatous colon polyp 02/1996, 02/2011    TA polyp 02/2011  . B12 deficiency   . Anemia in chronic renal disease 11/02/2011  . Esophageal stricture   . Diverticulosis   . Hemorrhoids   . MDS (myelodysplastic syndrome) ([place])   .  Glaucoma   . Heart palpitations   . Hiatal hernia   . History of colonic polyps 09/29/2006    Polyps age 57, Dr. Fuller Plan stated no further colonoscopy due to age. Confirmed with daughter given alzheimers, CAD history      MEDICATIONS: Current Outpatient Prescriptions on File Prior to Visit  Medication Sig Dispense Refill  . aspirin 81 MG tablet Take 81 mg by mouth daily.      . CVS OMEGA-3 KRILL OIL PO Take 350 mg by mouth daily.    [name] donepezil (ARICEPT) 10 MG tablet Take 1 tablet daily 90 tablet 3  . dorzolamide (TRUSOPT) 2 % ophthalmic solution Place 1 drop into both eyes 2 (two) times daily.      [name] doxazosin (CARDURA) 8 MG tablet Take 1 tablet by mouth at  bedtime 90 tablet 3  . isosorbide mononitrate (IMDUR) 30 MG 24 hr tablet Take 1 tablet by mouth  daily 90 tablet 3  . LUMIGAN 0.01 % SOLN Place 1 drop into both eyes at bedtime.     . Multiple Vitamin (MULTIVITAMIN) tablet Take 1 tablet by mouth daily.      [name] omeprazole (PRILOSEC) 20 MG capsule     . Probiotic Product (CVS PROBIOTIC) CHEW Chew by mouth daily.    . Pyridoxine HCl (VITAMIN B-6) 250 MG tablet Take 1 tablet (250 mg total) by mouth daily. 90 tablet 3  . simvastatin (ZOCOR) 40 MG tablet TAKE 1 TABLET BY MOUTH  DAILY AT 6 PM 90 tablet 2  . VESICARE 10 MG tablet Take 10 mg by mouth daily.     . nitroGLYCERIN (NITROSTAT) 0.4 MG SL tablet Place 0.4 mg under the tongue every 5 (five) minutes as needed. Reported on 12/12/2015     Current Facility-Administered Medications on File Prior to Visit  Medication Dose Route Frequency Provider Last Rate Last Dose  . darbepoetin (ARANESP) injection 300 mcg  300 mcg Subcutaneous Once [name], MD        ALLERGIES: No Known Allergies  FAMILY HISTORY: Family History  Problem Relation Age of Onset  . COPD Mother   . Alcohol abuse Father     SOCIAL HISTORY: Social History   Social History  . Marital Status: Married    Spouse Name: N/A  . Number of Children: 3  . Years of  Education: N/A   Occupational History  . Retired    Social History Main Topics  . Smoking status: Former Smoker    Quit date: 11/19/1968  . Smokeless tobacco: Never Used     Comment: never used tobacco  . Alcohol Use: 4.2 oz/week    7 Standard drinks or equivalent per week     Comment: Wine/Beer  . Drug Use: No  . Sexual Activity: Not on file   Other Topics Concern  . Not on file   Social History Narrative   Married (wife patient of Dr. Yong Channel as well)  Retired from [employer] and [employer]      Hobbies: Warehouse manager, [employer] .    REVIEW OF SYSTEMS: Constitutional: No fevers, chills, or sweats, no generalized fatigue, change in appetite Eyes: No visual changes, double vision, eye pain Ear, nose and throat: No hearing loss, ear pain, nasal congestion, sore throat Cardiovascular: No chest pain, palpitations Respiratory:  No shortness of breath at rest or with exertion, wheezes GastrointestinaI: No nausea, vomiting, diarrhea, abdominal pain, fecal incontinence Genitourinary:  No dysuria, urinary retention or frequency Musculoskeletal:  No neck pain, +back pain Integumentary: No rash, pruritus, skin lesions Neurological: as above Psychiatric: No depression, insomnia, anxiety Endocrine: No palpitations, fatigue, diaphoresis, mood swings, change in appetite, change in weight, increased thirst Hematologic/Lymphatic:  No anemia, purpura, petechiae. Allergic/Immunologic: no itchy/runny eyes, nasal congestion, recent allergic reactions, rashes  PHYSICAL EXAM: Filed Vitals:   12/12/15 0959  BP: 170/84  Pulse: 48  Resp: 14   General: No acute distress Head:  Normocephalic/atraumatic Neck: supple, no paraspinal tenderness, full range of motion Heart:  Regular rate and rhythm Lungs:  Clear to auscultation bilaterally Back: No paraspinal tenderness Skin/Extremities: No rash, no edema Neurological Exam: alert and oriented to person, place. No  aphasia or dysarthria. Fund of knowledge is appropriate.  Remote memory intact. 0/3 delayed recall.  Attention and concentration are normal.    Able to name objects and repeat phrases.  MMSE - Mini Mental State Exam 12/12/2015 03/11/2015  Orientation to time 1 5  Orientation to Place 5 5  Registration 3 3  Attention/ Calculation 2 0  Recall 0 1  Language- name 2 objects 2 2  Language- repeat 1 0  Language- follow 3 step command 3 2  Language- read & follow direction 1 1  Write a sentence 0 1  Copy design 0 1  Total score 18 21   Cranial nerves: Pupils equal, round, reactive to light. Extraocular movements intact with no nystagmus. Visual fields full. Facial sensation intact. No facial asymmetry. Tongue, uvula, palate midline.  Motor: Bulk and tone normal, muscle strength 5/5 throughout with no pronator drift.  Sensation to light touch intact.  No extinction to double simultaneous stimulation.  Deep tendon reflexes brisk 2+ throughout, toes downgoing.  Finger to nose testing intact.  Gait slow and cautious, stooped posture, seems to favor the right leg (similar to prior).  IMPRESSION: This is a pleasant 82 yo RH man with a history of hypertension, hyperlipidemia, myelodysplasia, anemia, lumbar stenosis, with worsening memory. MMSE today 18/30, indicating moderate dementia (it was 21/30 in April 2016). MRI brain unremarkable. He is tolerating Aricept 10mg  daily without side effects. We discussed the option of adding Namenda, side effects, and expectations from the medication. His daughter is concerned about side effects and reports from friends of worsening symptoms. He was given a starter pack of Namzaric (Aricept + Namenda), instructed to stop Aricept, and will call us if patient is tolerating medication. We discussed home safety and either getting more help at home, family is looking into assisted living, which I would recommend at this point. We also discussed the importance of physical exercise  and brain stimulation exercises for brain health.He will follow-up in 8 months and knows to call our office for any changes in the interim.  Thank you for allowing me to participate in his care.  Please do not hesitate to call for any questions or concerns.  The duration of this appointment visit was 25 minutes of face-to-face time with  the patient.  Greater than 50% of this time was spent in counseling, explanation of diagnosis, planning of further management, and coordination of care.   Ellouise Newer, M.D.   CC: Dr. Yong Channel

## 2015-12-12 NOTE — Patient Instructions (Signed)
1. Start Namzaric (Aricept + Namenda) starter pack. Stop the Aricept. Call our office once starter pack is finishing, if no problems, we will send in prescription 2. Agree with looking into assisted living facilities 3. Follow-up in 8-9 months

## 2015-12-14 ENCOUNTER — Ambulatory Visit (INDEPENDENT_AMBULATORY_CARE_PROVIDER_SITE_OTHER): Payer: Medicare Other | Admitting: Podiatry

## 2015-12-14 DIAGNOSIS — M79675 Pain in left toe(s): Secondary | ICD-10-CM | POA: Diagnosis not present

## 2015-12-14 DIAGNOSIS — B351 Tinea unguium: Secondary | ICD-10-CM

## 2015-12-14 DIAGNOSIS — M79676 Pain in unspecified toe(s): Secondary | ICD-10-CM | POA: Diagnosis not present

## 2015-12-14 DIAGNOSIS — I739 Peripheral vascular disease, unspecified: Secondary | ICD-10-CM | POA: Diagnosis not present

## 2015-12-14 NOTE — Progress Notes (Signed)
Patient ID: Alexander Rice, male   DOB: 11/22/1931, 83 y.o.   MRN: [id number] HPI  Complaint:  Visit Type: Patient returns to my office for continued preventative foot care services. Complaint: Patient states" my nails have grown long and thick and become painful to walk and wear shoes. He presents for preventative foot care services. No changes to ROS  Podiatric Exam: Vascular: dorsalis pedis and posterior tibial pulses are negative. Capillary return is diminished.. Temperature gradient is negative. Skin turgor WNL,   Sensorium: Normal Semmes Weinstein monofilament test. Normal tactile sensation bilaterally.  Nail Exam: Pt has thick disfigured discolored nails with subungual debris noted bilateral entire nail hallux  Ulcer Exam: There is no evidence of ulcer or pre-ulcerative changes or infection. Orthopedic Exam: Muscle tone and strength are WNL. No limitations in general ROM. No crepitus or effusions noted. Foot type and digits show no abnormalities. Bony prominences are unremarkable. Skin: No Porokeratosis. No infection or ulcers  Diagnosis:  Tinea unguium, Pain in right toe, pain in left toes  Treatment & Plan Procedures and Treatment: Consent by patient was obtained for treatment procedures. The patient understood the discussion of treatment and procedures well. All questions were answered thoroughly reviewed. Debridement of mycotic and hypertrophic toenails, 1 through 5 bilateral and clearing of subungual debris. No ulceration, no infection noted.  Return Visit-Office Procedure: Patient instructed to return to the office for a follow up visit 3 months for continued evaluation and treatment.   Oreoluwa Gilmer DPM  

## 2015-12-16 ENCOUNTER — Ambulatory Visit (HOSPITAL_BASED_OUTPATIENT_CLINIC_OR_DEPARTMENT_OTHER): Payer: Medicare Other

## 2015-12-16 ENCOUNTER — Ambulatory Visit (HOSPITAL_BASED_OUTPATIENT_CLINIC_OR_DEPARTMENT_OTHER): Payer: Medicare Other | Admitting: Family

## 2015-12-16 ENCOUNTER — Other Ambulatory Visit (HOSPITAL_BASED_OUTPATIENT_CLINIC_OR_DEPARTMENT_OTHER): Payer: Medicare Other

## 2015-12-16 ENCOUNTER — Encounter: Payer: Self-pay | Admitting: Hematology & Oncology

## 2015-12-16 VITALS — BP 185/50 | HR 50 | Temp 97.4°F | Resp 18 | Ht 67.0 in | Wt 150.0 lb

## 2015-12-16 DIAGNOSIS — D510 Vitamin B12 deficiency anemia due to intrinsic factor deficiency: Secondary | ICD-10-CM | POA: Diagnosis not present

## 2015-12-16 DIAGNOSIS — D461 Refractory anemia with ring sideroblasts: Secondary | ICD-10-CM

## 2015-12-16 DIAGNOSIS — D469 Myelodysplastic syndrome, unspecified: Secondary | ICD-10-CM

## 2015-12-16 DIAGNOSIS — N289 Disorder of kidney and ureter, unspecified: Secondary | ICD-10-CM

## 2015-12-16 DIAGNOSIS — D518 Other vitamin B12 deficiency anemias: Secondary | ICD-10-CM

## 2015-12-16 DIAGNOSIS — D649 Anemia, unspecified: Secondary | ICD-10-CM

## 2015-12-16 LAB — CBC WITH DIFFERENTIAL (CANCER CENTER ONLY)
BASO#: 0 10*3/uL (ref 0.0–0.2)
BASO%: 0.2 % (ref 0.0–2.0)
EOS%: 2 % (ref 0.0–7.0)
Eosinophils Absolute: 0.1 10*3/uL (ref 0.0–0.5)
HCT: 25 % — ABNORMAL LOW (ref 38.7–49.9)
HGB: 8.7 g/dL — ABNORMAL LOW (ref 13.0–17.1)
LYMPH#: 1.3 10*3/uL (ref 0.9–3.3)
LYMPH%: 23.7 % (ref 14.0–48.0)
MCH: 33.7 pg — ABNORMAL HIGH (ref 28.0–33.4)
MCHC: 34.8 g/dL (ref 32.0–35.9)
MCV: 97 fL (ref 82–98)
MONO#: 0.6 10*3/uL (ref 0.1–0.9)
MONO%: 10.2 % (ref 0.0–13.0)
NEUT#: 3.6 10*3/uL (ref 1.5–6.5)
NEUT%: 63.9 % (ref 40.0–80.0)
PLATELETS: 392 10*3/uL (ref 145–400)
RBC: 2.58 10*6/uL — AB (ref 4.20–5.70)
RDW: 18.3 % — AB (ref 11.1–15.7)
WBC: 5.6 10*3/uL (ref 4.0–10.0)

## 2015-12-16 LAB — COMPREHENSIVE METABOLIC PANEL
ALK PHOS: 53 U/L (ref 40–150)
ALT: 18 U/L (ref 0–55)
ANION GAP: 6 meq/L (ref 3–11)
AST: 21 U/L (ref 5–34)
Albumin: 3.8 g/dL (ref 3.5–5.0)
BILIRUBIN TOTAL: 0.96 mg/dL (ref 0.20–1.20)
BUN: 14.5 mg/dL (ref 7.0–26.0)
CHLORIDE: 97 meq/L — AB (ref 98–109)
CO2: 23 mEq/L (ref 22–29)
Calcium: 8.4 mg/dL (ref 8.4–10.4)
Creatinine: 0.9 mg/dL (ref 0.7–1.3)
EGFR: 79 mL/min/{1.73_m2} — ABNORMAL LOW (ref >90)
Glucose: 93 mg/dl (ref 70–140)
Potassium: 4.4 mEq/L (ref 3.5–5.1)
SODIUM: 126 meq/L — AB (ref 136–145)
TOTAL PROTEIN: 5.9 g/dL — AB (ref 6.4–8.3)

## 2015-12-16 MED ORDER — EPOETIN ALFA 40000 UNIT/ML IJ SOLN
INTRAMUSCULAR | Status: AC
  Filled 2015-12-16: qty 1

## 2015-12-16 MED ORDER — EPOETIN ALFA 40000 UNIT/ML IJ SOLN
40000.0000 [IU] | Freq: Once | INTRAMUSCULAR | Status: AC
  Administered 2015-12-16: 40000 [IU] via SUBCUTANEOUS

## 2015-12-16 NOTE — Progress Notes (Signed)
Hematology and Oncology Follow Up Visit  GILMORE TSUDA [id number] [birth year] 82 y.o. 12/16/2015   Principle Diagnosis:  Anemia of renal insufficiency  Refractory anemia with ringed sideroblasts. Pernicious anemia  Current Therapy:   Procrit 40,000 units subcutaneous monthly Vitamin B6 250 mg by mouth daily    Interim History: Alexander Rice is here today with is wife and daughter for a follow-up. He continues to do well but has had some intermittent fatigue. He has not been receiving his B 12 injections. We will check a level on him today and see if he is low again.  I did a manual BP on him and it was 122/54, HR 50.  His family states that his dementia is worsening and that he is now on Namenda with his Aricept. He is pleasantly confused at times.  His Hgb has remained stable at 8.7 with an MCV of 97.  No fever, chills, cough, rash, headache, dizziness, vision changes, SOB, chest pain, palpitations, abdominal pain or changes in bowel or bladder habits.  No c/o joint aches or pains. He has had no swelling or tenderness in his extremities.  He has maintained a good appetite and is staying hydrated. His weight is unchanged.   Medications:    Medication List       This list is accurate as of: 12/16/15  1:11 PM.  Always use your most recent med list.               aspirin 81 MG tablet  Take 81 mg by mouth daily.     CVS OMEGA-3 KRILL OIL PO  Take 350 mg by mouth daily.     [facility]S PROBIOTIC Chew  Chew by mouth daily.     donepezil 10 MG tablet  Commonly known as:  ARICEPT  Take 1 tablet daily     dorzolamide 2 % ophthalmic solution  Commonly known as:  TRUSOPT  Place 1 drop into both eyes 2 (two) times daily.     doxazosin 8 MG tablet  Commonly known as:  CARDURA  Take 1 tablet by mouth at  bedtime     isosorbide mononitrate 30 MG 24 hr tablet  Commonly known as:  IMDUR  Take 1 tablet by mouth  daily     LUMIGAN 0.01 % Soln  Generic drug:  bimatoprost  Place 1  drop into both eyes at bedtime.     multivitamin tablet  Take 1 tablet by mouth daily.     nitroGLYCERIN 0.4 MG SL tablet  Commonly known as:  NITROSTAT  Place 0.4 mg under the tongue every 5 (five) minutes as needed. Reported on 12/12/2015     omeprazole 20 MG capsule  Commonly known as:  PRILOSEC     simvastatin 40 MG tablet  Commonly known as:  ZOCOR  TAKE 1 TABLET BY MOUTH  DAILY AT 6 PM     VESICARE 10 MG tablet  Generic drug:  solifenacin  Take 10 mg by mouth daily.     vitamin B-6 250 MG tablet  Take 1 tablet (250 mg total) by mouth daily.        Allergies: No Known Allergies  Past Medical History, Surgical history, Social history, and Family History were reviewed and updated.  Review of Systems: All other 10 point review of systems is negative.   Physical Exam:  height is 5\' 7"  (1.702 m) and weight is 150 lb (68.04 kg). His oral temperature is 97.4 F (36.3 C). His blood pressure  is 185/50 and his pulse is 50. His respiration is 18.   Wt Readings from Last 3 Encounters:  12/16/15 150 lb (68.04 kg)  12/12/15 151 lb (68.493 kg)  11/25/15 151 lb (68.493 kg)    Ocular: Sclerae unicteric, pupils equal, round and reactive to light Ear-nose-throat: Oropharynx clear, dentition fair Lymphatic: No cervical supraclavicular or axillary adenopathy Lungs no rales or rhonchi, good excursion bilaterally Heart regular rate and rhythm, no murmur appreciated Abd soft, nontender, positive bowel sounds, no liver or spleen tip palpated on exam MSK no focal spinal tenderness, no joint edema Neuro: non-focal, well-oriented, appropriate affect Breast: Deferred   Lab Results  Component Value Date   WBC 5.6 12/16/2015   HGB 8.7* 12/16/2015   HCT 25.0* 12/16/2015   MCV 97 12/16/2015   PLT 392 12/16/2015   Lab Results  Component Value Date   FERRITIN 820* 11/25/2015   IRON 96 11/25/2015   TIBC 195* 11/25/2015   UIBC 99* 11/25/2015   IRONPCTSAT 49 11/25/2015   Lab  Results  Component Value Date   RETICCTPCT 1.0 11/25/2015   RBC 2.58* 12/16/2015   RETICCTABS 27.2 11/25/2015   Lab Results  Component Value Date   KPAFRELGTCHN 3.09* 04/22/2008   LAMBDASER 1.01 04/22/2008   KAPLAMBRATIO 3.06* 04/22/2008   No results found for: Kandis Cocking, IGMSERUM Lab Results  Component Value Date   TOTALPROTELP 6.3 04/22/2008   ALBUMINELP 66.3* 04/22/2008   A1GS 3.9 04/22/2008   A2GS 8.1 04/22/2008   BETS 5.2 04/22/2008   BETA2SER 4.0 04/22/2008   GAMS 12.5 04/22/2008   MSPIKE NOT DET 04/22/2008   SPEI * 04/22/2008     Chemistry      Component Value Date/Time   NA 127* 11/25/2015 1026   NA 134* 08/11/2015 0928   NA 134 05/17/2015 0936   K 4.6 11/25/2015 1026   K 4.2 08/11/2015 0928   K 4.5 05/17/2015 0936   CL 102 08/11/2015 0928   CL 103 05/17/2015 0936   CO2 22 11/25/2015 1026   CO2 24 08/11/2015 0928   CO2 27 05/17/2015 0936   BUN 14.9 11/25/2015 1026   BUN 14 08/11/2015 0928   BUN 11 05/17/2015 0936   CREATININE 1.0 11/25/2015 1026   CREATININE 1.03 08/11/2015 0928   CREATININE 1.0 05/17/2015 0936      Component Value Date/Time   CALCIUM 8.4 11/25/2015 1026   CALCIUM 8.8 08/11/2015 0928   CALCIUM 8.8 05/17/2015 0936   ALKPHOS 48 11/25/2015 1026   ALKPHOS 42 08/11/2015 0928   ALKPHOS 43 05/17/2015 0936   AST 19 11/25/2015 1026   AST 18 08/11/2015 0928   AST 26 05/17/2015 0936   ALT 14 11/25/2015 1026   ALT 15 08/11/2015 0928   ALT 25 05/17/2015 0936   BILITOT 1.03 11/25/2015 1026   BILITOT 1.0 08/11/2015 0928   BILITOT 1.10 05/17/2015 0936     Impression and Plan: Alexander Rice is an 82 yo white male with myelodysplasia and refractory anemia with ringed sideroblasts. He has been having some intermittent fatigue at times.  We will see what his iron studies show.  His Hgb today is 8.7 with an MCV of 97 so he will receive Procrit today.  His B 12 level is >2000 so he will not need to continue his injections at this time. We will  plan to see him back in 3 weeks for follow-up.   He will contact us with any questions or concerns. We can certainly see him  sooner if need be.   Eliezer Bottom, NP 1/27/20171:11 PM

## 2015-12-16 NOTE — Patient Instructions (Signed)
Epoetin Alfa injection What is this medicine? EPOETIN ALFA (e POE e tin AL fa) helps your body make more red blood cells. This medicine is used to treat anemia caused by chronic kidney failure, cancer chemotherapy, or HIV-therapy. It may also be used before surgery if you have anemia. This medicine may be used for other purposes; ask your health care provider or pharmacist if you have questions. What should I tell my health care provider before I take this medicine? They need to know if you have any of these conditions: -blood clotting disorders -cancer patient not on chemotherapy -cystic fibrosis -heart disease, such as angina or heart failure -hemoglobin level of 12 g/dL or greater -high blood pressure -low levels of folate, iron, or vitamin B12 -seizures -an unusual or allergic reaction to erythropoietin, albumin, benzyl alcohol, hamster proteins, other medicines, foods, dyes, or preservatives -pregnant or trying to get pregnant -breast-feeding How should I use this medicine? This medicine is for injection into a vein or under the skin. It is usually given by a health care professional in a hospital or clinic setting. If you get this medicine at home, you will be taught how to prepare and give this medicine. Use exactly as directed. Take your medicine at regular intervals. Do not take your medicine more often than directed. It is important that you put your used needles and syringes in a special sharps container. Do not put them in a trash can. If you do not have a sharps container, call your pharmacist or healthcare provider to get one. Talk to your pediatrician regarding the use of this medicine in children. While this drug may be prescribed for selected conditions, precautions do apply. Overdosage: If you think you have taken too much of this medicine contact a poison control center or emergency room at once. NOTE: This medicine is only for you. Do not share this medicine with  others. What if I miss a dose? If you miss a dose, take it as soon as you can. If it is almost time for your next dose, take only that dose. Do not take double or extra doses. What may interact with this medicine? Do not take this medicine with any of the following medications: -darbepoetin alfa This list may not describe all possible interactions. Give your health care provider a list of all the medicines, herbs, non-prescription drugs, or dietary supplements you use. Also tell them if you smoke, drink alcohol, or use illegal drugs. Some items may interact with your medicine. What should I watch for while using this medicine? Visit your prescriber or health care professional for regular checks on your progress and for the needed blood tests and blood pressure measurements. It is especially important for the doctor to make sure your hemoglobin level is in the desired range, to limit the risk of potential side effects and to give you the best benefit. Keep all appointments for any recommended tests. Check your blood pressure as directed. Ask your doctor what your blood pressure should be and when you should contact him or her. As your body makes more red blood cells, you may need to take iron, folic acid, or vitamin B supplements. Ask your doctor or health care provider which products are right for you. If you have kidney disease continue dietary restrictions, even though this medication can make you feel better. Talk with your doctor or health care professional about the foods you eat and the vitamins that you take. What side effects may I notice   from receiving this medicine? Side effects that you should report to your doctor or health care professional as soon as possible: -allergic reactions like skin rash, itching or hives, swelling of the face, lips, or tongue -breathing problems -changes in vision -chest pain -confusion, trouble speaking or understanding -feeling faint or lightheaded,  falls -high blood pressure -muscle aches or pains -pain, swelling, warmth in the leg -rapid weight gain -severe headaches -sudden numbness or weakness of the face, arm or leg -trouble walking, dizziness, loss of balance or coordination -seizures (convulsions) -swelling of the ankles, feet, hands -unusually weak or tired Side effects that usually do not require medical attention (report to your doctor or health care professional if they continue or are bothersome): -diarrhea -fever, chills (flu-like symptoms) -headaches -nausea, vomiting -redness, stinging, or swelling at site where injected This list may not describe all possible side effects. Call your doctor for medical advice about side effects. You may report side effects to FDA at 1-800-FDA-1088. Where should I keep my medicine? Keep out of the reach of children. Store in a refrigerator between 2 and 8 degrees C (36 and 46 degrees F). Do not freeze or shake. Throw away any unused portion if using a single-dose vial. Multi-dose vials can be kept in the refrigerator for up to 21 days after the initial dose. Throw away unused medicine. NOTE: This sheet is a summary. It may not cover all possible information. If you have questions about this medicine, talk to your doctor, pharmacist, or health care provider.    2016, [employer] Standard. (2008-10-19 10:25:44)  

## 2015-12-17 LAB — VITAMIN B12: Vitamin B12: >2000 pg/mL — ABNORMAL HIGH (ref 211–946)

## 2015-12-19 LAB — IRON AND TIBC
%SAT: 57 % — AB (ref 20–55)
Iron: 110 ug/dL (ref 42–163)
TIBC: 193 ug/dL — ABNORMAL LOW (ref 202–409)
UIBC: 82 ug/dL — AB (ref 117–376)

## 2015-12-19 LAB — FERRITIN: FERRITIN: 753 ng/mL — AB (ref 22–316)

## 2016-01-06 ENCOUNTER — Encounter: Payer: Self-pay | Admitting: Family

## 2016-01-06 ENCOUNTER — Other Ambulatory Visit (HOSPITAL_BASED_OUTPATIENT_CLINIC_OR_DEPARTMENT_OTHER): Payer: Medicare Other

## 2016-01-06 ENCOUNTER — Ambulatory Visit (HOSPITAL_BASED_OUTPATIENT_CLINIC_OR_DEPARTMENT_OTHER): Payer: Medicare Other

## 2016-01-06 ENCOUNTER — Ambulatory Visit (HOSPITAL_BASED_OUTPATIENT_CLINIC_OR_DEPARTMENT_OTHER): Payer: Medicare Other | Admitting: Family

## 2016-01-06 ENCOUNTER — Telehealth: Payer: Self-pay | Admitting: Neurology

## 2016-01-06 VITALS — BP 165/48 | HR 57 | Temp 97.4°F | Resp 16 | Ht 67.0 in | Wt 151.0 lb

## 2016-01-06 DIAGNOSIS — D518 Other vitamin B12 deficiency anemias: Secondary | ICD-10-CM

## 2016-01-06 DIAGNOSIS — D631 Anemia in chronic kidney disease: Secondary | ICD-10-CM

## 2016-01-06 DIAGNOSIS — D649 Anemia, unspecified: Secondary | ICD-10-CM | POA: Diagnosis not present

## 2016-01-06 DIAGNOSIS — N289 Disorder of kidney and ureter, unspecified: Secondary | ICD-10-CM | POA: Diagnosis not present

## 2016-01-06 DIAGNOSIS — D510 Vitamin B12 deficiency anemia due to intrinsic factor deficiency: Secondary | ICD-10-CM

## 2016-01-06 DIAGNOSIS — D469 Myelodysplastic syndrome, unspecified: Secondary | ICD-10-CM

## 2016-01-06 DIAGNOSIS — R5383 Other fatigue: Secondary | ICD-10-CM | POA: Diagnosis not present

## 2016-01-06 DIAGNOSIS — D461 Refractory anemia with ring sideroblasts: Secondary | ICD-10-CM

## 2016-01-06 DIAGNOSIS — N189 Chronic kidney disease, unspecified: Secondary | ICD-10-CM

## 2016-01-06 DIAGNOSIS — R209 Unspecified disturbances of skin sensation: Secondary | ICD-10-CM | POA: Diagnosis not present

## 2016-01-06 LAB — CBC WITH DIFFERENTIAL (CANCER CENTER ONLY)
BASO#: 0 10*3/uL (ref 0.0–0.2)
BASO%: 0.2 % (ref 0.0–2.0)
EOS ABS: 0.1 10*3/uL (ref 0.0–0.5)
EOS%: 1 % (ref 0.0–7.0)
HCT: 25.2 % — ABNORMAL LOW (ref 38.7–49.9)
HGB: 8.8 g/dL — ABNORMAL LOW (ref 13.0–17.1)
LYMPH#: 1.3 10*3/uL (ref 0.9–3.3)
LYMPH%: 26.2 % (ref 14.0–48.0)
MCH: 33.8 pg — AB (ref 28.0–33.4)
MCHC: 34.9 g/dL (ref 32.0–35.9)
MCV: 97 fL (ref 82–98)
MONO#: 0.5 10*3/uL (ref 0.1–0.9)
MONO%: 9.8 % (ref 0.0–13.0)
NEUT#: 3.1 10*3/uL (ref 1.5–6.5)
NEUT%: 62.8 % (ref 40.0–80.0)
PLATELETS: 381 10*3/uL (ref 145–400)
RBC: 2.6 10*6/uL — ABNORMAL LOW (ref 4.20–5.70)
RDW: 18.8 % — AB (ref 11.1–15.7)
WBC: 4.9 10*3/uL (ref 4.0–10.0)

## 2016-01-06 LAB — COMPREHENSIVE METABOLIC PANEL
ALT: 17 U/L (ref 0–55)
ANION GAP: 7 meq/L (ref 3–11)
AST: 23 U/L (ref 5–34)
Albumin: 3.9 g/dL (ref 3.5–5.0)
Alkaline Phosphatase: 53 U/L (ref 40–150)
BUN: 15.4 mg/dL (ref 7.0–26.0)
CHLORIDE: 97 meq/L — AB (ref 98–109)
CO2: 22 meq/L (ref 22–29)
Calcium: 8.7 mg/dL (ref 8.4–10.4)
Creatinine: 1 mg/dL (ref 0.7–1.3)
EGFR: 75 mL/min/{1.73_m2} — AB (ref >90)
Glucose: 88 mg/dl (ref 70–140)
POTASSIUM: 4.4 meq/L (ref 3.5–5.1)
Sodium: 126 mEq/L — ABNORMAL LOW (ref 136–145)
Total Bilirubin: 1.12 mg/dL (ref 0.20–1.20)
Total Protein: 5.9 g/dL — ABNORMAL LOW (ref 6.4–8.3)

## 2016-01-06 MED ORDER — EPOETIN ALFA 40000 UNIT/ML IJ SOLN
INTRAMUSCULAR | Status: AC
  Filled 2016-01-06: qty 1

## 2016-01-06 MED ORDER — MEMANTINE HCL-DONEPEZIL HCL ER 28-10 MG PO CP24: 1.0000 | ORAL_CAPSULE | Freq: Every day | ORAL | Status: DC

## 2016-01-06 MED ORDER — EPOETIN ALFA 40000 UNIT/ML IJ SOLN
40000.0000 [IU] | Freq: Once | INTRAMUSCULAR | Status: AC
  Administered 2016-01-06: 40000 [IU] via SUBCUTANEOUS

## 2016-01-06 NOTE — Patient Instructions (Signed)
Epoetin Alfa injection What is this medicine? EPOETIN ALFA (e POE e tin AL fa) helps your body make more red blood cells. This medicine is used to treat anemia caused by chronic kidney failure, cancer chemotherapy, or HIV-therapy. It may also be used before surgery if you have anemia. This medicine may be used for other purposes; ask your health care provider or pharmacist if you have questions. What should I tell my health care provider before I take this medicine? They need to know if you have any of these conditions: -blood clotting disorders -cancer patient not on chemotherapy -cystic fibrosis -heart disease, such as angina or heart failure -hemoglobin level of 12 g/dL or greater -high blood pressure -low levels of folate, iron, or vitamin B12 -seizures -an unusual or allergic reaction to erythropoietin, albumin, benzyl alcohol, hamster proteins, other medicines, foods, dyes, or preservatives -pregnant or trying to get pregnant -breast-feeding How should I use this medicine? This medicine is for injection into a vein or under the skin. It is usually given by a health care professional in a hospital or clinic setting. If you get this medicine at home, you will be taught how to prepare and give this medicine. Use exactly as directed. Take your medicine at regular intervals. Do not take your medicine more often than directed. It is important that you put your used needles and syringes in a special sharps container. Do not put them in a trash can. If you do not have a sharps container, call your pharmacist or healthcare provider to get one. Talk to your pediatrician regarding the use of this medicine in children. While this drug may be prescribed for selected conditions, precautions do apply. Overdosage: If you think you have taken too much of this medicine contact a poison control center or emergency room at once. NOTE: This medicine is only for you. Do not share this medicine with  others. What if I miss a dose? If you miss a dose, take it as soon as you can. If it is almost time for your next dose, take only that dose. Do not take double or extra doses. What may interact with this medicine? Do not take this medicine with any of the following medications: -darbepoetin alfa This list may not describe all possible interactions. Give your health care provider a list of all the medicines, herbs, non-prescription drugs, or dietary supplements you use. Also tell them if you smoke, drink alcohol, or use illegal drugs. Some items may interact with your medicine. What should I watch for while using this medicine? Visit your prescriber or health care professional for regular checks on your progress and for the needed blood tests and blood pressure measurements. It is especially important for the doctor to make sure your hemoglobin level is in the desired range, to limit the risk of potential side effects and to give you the best benefit. Keep all appointments for any recommended tests. Check your blood pressure as directed. Ask your doctor what your blood pressure should be and when you should contact him or her. As your body makes more red blood cells, you may need to take iron, folic acid, or vitamin B supplements. Ask your doctor or health care provider which products are right for you. If you have kidney disease continue dietary restrictions, even though this medication can make you feel better. Talk with your doctor or health care professional about the foods you eat and the vitamins that you take. What side effects may I notice   from receiving this medicine? Side effects that you should report to your doctor or health care professional as soon as possible: -allergic reactions like skin rash, itching or hives, swelling of the face, lips, or tongue -breathing problems -changes in vision -chest pain -confusion, trouble speaking or understanding -feeling faint or lightheaded,  falls -high blood pressure -muscle aches or pains -pain, swelling, warmth in the leg -rapid weight gain -severe headaches -sudden numbness or weakness of the face, arm or leg -trouble walking, dizziness, loss of balance or coordination -seizures (convulsions) -swelling of the ankles, feet, hands -unusually weak or tired Side effects that usually do not require medical attention (report to your doctor or health care professional if they continue or are bothersome): -diarrhea -fever, chills (flu-like symptoms) -headaches -nausea, vomiting -redness, stinging, or swelling at site where injected This list may not describe all possible side effects. Call your doctor for medical advice about side effects. You may report side effects to FDA at 1-800-FDA-1088. Where should I keep my medicine? Keep out of the reach of children. Store in a refrigerator between 2 and 8 degrees C (36 and 46 degrees F). Do not freeze or shake. Throw away any unused portion if using a single-dose vial. Multi-dose vials can be kept in the refrigerator for up to 21 days after the initial dose. Throw away unused medicine. NOTE: This sheet is a summary. It may not cover all possible information. If you have questions about this medicine, talk to your doctor, pharmacist, or health care provider.    2016, [employer] Standard. (2008-10-19 10:25:44)  

## 2016-01-06 NOTE — Telephone Encounter (Signed)
[name] Curt Bears called in regards to his medication Namenda/Dawn CB# [phone]

## 2016-01-06 NOTE — Telephone Encounter (Signed)
Returned call to [employer]. She was wanting to know if the Namzaric that patient is on could be causing him to be confused with day and night. She states that patient is getting up some nights between midnight & 2am and thinks its the next day. I explained to her that since patient was doing these this before being started on Namzaric that it wouldn't be the medication. She states that patient has been doing well on the the Namzaric starter pack and will be finished with pack on next Saturday. Told her I would go ahead and submit new rx to his pharmacy since he uses mail order.

## 2016-01-06 NOTE — Progress Notes (Signed)
Hematology and Oncology Follow Up Visit  Alexander Rice [id number] 05/18/1932 82 y.o. 01/06/2016   Principle Diagnosis:  Anemia of renal insufficiency  Refractory anemia with ringed sideroblasts. Pernicious anemia  Current Therapy:   Procrit 40,000 units subcutaneous monthly Vitamin B6 250 mg by mouth daily    Interim History: Alexander Rice is here today with is wife and daughter for a follow-up. He is in good spirits and states that he has been doing well. He has mild fatigue at times but this is tolerable.  His Hgb is holding nicely with the Procrit injections and is 8.8 today.  He has had no episodes of bleeding, bruising or petechiae. No lymphadenopathy found on exam.  No fever, chills, cough, rash, headache, dizziness, vision changes, SOB, chest pain, palpitations, abdominal pain or changes in bowel or bladder habits.  He has had mild numbness and tingling in his hands that comes and goes. He has had no swelling or tenderness in his extremities. No c/o joint aches or bone pan at this time.  He has a good appetite and is staying well hydrated. His weight is unchanged.   Medications:    Medication List       This list is accurate as of: 01/06/16  2:07 PM.  Always use your most recent med list.               aspirin 81 MG tablet  Take 81 mg by mouth daily.     [facility] OMEGA-3 KRILL OIL PO  Take 350 mg by mouth daily.     [facility] PROBIOTIC Chew  Chew by mouth daily.     dorzolamide 2 % ophthalmic solution  Commonly known as:  TRUSOPT  Place 1 drop into both eyes 2 (two) times daily.     doxazosin 8 MG tablet  Commonly known as:  CARDURA  Take 1 tablet by mouth at  bedtime     isosorbide mononitrate 30 MG 24 hr tablet  Commonly known as:  IMDUR  Take 1 tablet by mouth  daily     LUMIGAN 0.01 % Soln  Generic drug:  bimatoprost  Place 1 drop into both eyes at bedtime.     multivitamin tablet  Take 1 tablet by mouth daily.     nitroGLYCERIN 0.4 MG SL tablet    Commonly known as:  NITROSTAT  Place 0.4 mg under the tongue every 5 (five) minutes as needed. Reported on 12/12/2015     omeprazole 20 MG capsule  Commonly known as:  PRILOSEC     simvastatin 40 MG tablet  Commonly known as:  ZOCOR  TAKE 1 TABLET BY MOUTH  DAILY AT 6 PM     VESICARE 10 MG tablet  Generic drug:  solifenacin  Take 10 mg by mouth daily.     vitamin B-6 250 MG tablet  Take 1 tablet (250 mg total) by mouth daily.        Allergies: No Known Allergies  Past Medical History, Surgical history, Social history, and Family History were reviewed and updated.  Review of Systems: All other 10 point review of systems is negative.   Physical Exam:  height is 5\' 7"  (1.702 m) and weight is 151 lb (68.493 kg). His oral temperature is 97.4 F (36.3 C). His blood pressure is 165/48 and his pulse is 57. His respiration is 16.   Wt Readings from Last 3 Encounters:  01/06/16 151 lb (68.493 kg)  12/16/15 150 lb (68.04 kg)  12/12/15 151  lb (68.493 kg)    Ocular: Sclerae unicteric, pupils equal, round and reactive to light Ear-nose-throat: Oropharynx clear, dentition fair Lymphatic: No cervical supraclavicular or axillary adenopathy Lungs no rales or rhonchi, good excursion bilaterally Heart regular rate and rhythm, no murmur appreciated Abd soft, nontender, positive bowel sounds, no liver or spleen tip palpated on exam MSK no focal spinal tenderness, no joint edema Neuro: non-focal, well-oriented, appropriate affect Breast: Deferred   Lab Results  Component Value Date   WBC 4.9 01/06/2016   HGB 8.8* 01/06/2016   HCT 25.2* 01/06/2016   MCV 97 01/06/2016   PLT 381 01/06/2016   Lab Results  Component Value Date   FERRITIN 753* 12/16/2015   IRON 110 12/16/2015   TIBC 193* 12/16/2015   UIBC 82* 12/16/2015   IRONPCTSAT 57* 12/16/2015   Lab Results  Component Value Date   RETICCTPCT 1.0 11/25/2015   RBC 2.60* 01/06/2016   RETICCTABS 27.2 11/25/2015   Lab Results   Component Value Date   KPAFRELGTCHN 3.09* 04/22/2008   LAMBDASER 1.01 04/22/2008   KAPLAMBRATIO 3.06* 04/22/2008   No results found for: Kandis Cocking, IGMSERUM Lab Results  Component Value Date   TOTALPROTELP 6.3 04/22/2008   ALBUMINELP 66.3* 04/22/2008   A1GS 3.9 04/22/2008   A2GS 8.1 04/22/2008   BETS 5.2 04/22/2008   BETA2SER 4.0 04/22/2008   GAMS 12.5 04/22/2008   MSPIKE NOT DET 04/22/2008   SPEI * 04/22/2008     Chemistry      Component Value Date/Time   NA 126* 12/16/2015 1205   NA 134* 08/11/2015 0928   NA 134 05/17/2015 0936   K 4.4 12/16/2015 1205   K 4.2 08/11/2015 0928   K 4.5 05/17/2015 0936   CL 102 08/11/2015 0928   CL 103 05/17/2015 0936   CO2 23 12/16/2015 1205   CO2 24 08/11/2015 0928   CO2 27 05/17/2015 0936   BUN 14.5 12/16/2015 1205   BUN 14 08/11/2015 0928   BUN 11 05/17/2015 0936   CREATININE 0.9 12/16/2015 1205   CREATININE 1.03 08/11/2015 0928   CREATININE 1.0 05/17/2015 0936      Component Value Date/Time   CALCIUM 8.4 12/16/2015 1205   CALCIUM 8.8 08/11/2015 0928   CALCIUM 8.8 05/17/2015 0936   ALKPHOS 53 12/16/2015 1205   ALKPHOS 42 08/11/2015 0928   ALKPHOS 43 05/17/2015 0936   AST 21 12/16/2015 1205   AST 18 08/11/2015 0928   AST 26 05/17/2015 0936   ALT 18 12/16/2015 1205   ALT 15 08/11/2015 0928   ALT 25 05/17/2015 0936   BILITOT 0.96 12/16/2015 1205   BILITOT 1.0 08/11/2015 0928   BILITOT 1.10 05/17/2015 0936     Impression and Plan: Alexander Rice is an 82 yo white male with myelodysplasia and refractory anemia with ringed sideroblasts. He continues to have fatigue at times and also some mild numbness and singling in his hands that comes and goes.  We will see what his iron studies show. His Hgb is holding at 8.8.  He will receive a Procrit injection today.   We will plan to see him back in 1 month for lab work, follow-up and injection.   He will contact us with any questions or concerns. We can certainly see him sooner if  need be.   Alexander Bottom, NP 2/17/20172:07 PM

## 2016-01-07 LAB — RETICULOCYTES: Reticulocyte Count: 0.5 % — ABNORMAL LOW (ref 0.6–2.6)

## 2016-01-09 ENCOUNTER — Encounter: Payer: Self-pay | Admitting: Neurology

## 2016-01-09 DIAGNOSIS — R350 Frequency of micturition: Secondary | ICD-10-CM | POA: Diagnosis not present

## 2016-01-09 DIAGNOSIS — R351 Nocturia: Secondary | ICD-10-CM | POA: Diagnosis not present

## 2016-01-09 LAB — IRON AND TIBC
%SAT: 89 % — AB (ref 20–55)
IRON: 173 ug/dL — AB (ref 42–163)
TIBC: 194 ug/dL — AB (ref 202–409)
UIBC: 21 ug/dL — ABNORMAL LOW (ref 117–376)

## 2016-01-09 LAB — FERRITIN: Ferritin: 873 ng/mL — ABNORMAL HIGH (ref 22–316)

## 2016-01-10 ENCOUNTER — Telehealth: Payer: Self-pay | Admitting: Family Medicine

## 2016-01-10 NOTE — Telephone Encounter (Signed)
Patient Name: Alexander Rice  Gender: Male  DOB: [birth year]   Age: 82 Y 3 M 18 D  Return Phone Number: [phone] (Primary)  Address:   City/State/Zip: [place]    Client [facility] - Client  Client Site [facility] - Day  Physician [name]   Contact Type Call  Who Is Calling Patient / Member / Family / Caregiver  Call Type Triage / Clinical  Caller Name Fred Adjei  Relationship To Patient Daughter  Return Phone Number [phone] (Primary)  Chief Complaint Blood Pressure High  Reason for Call Symptomatic / Request for [place] states father's blood pressure has been elevated lately  Appointment Disposition EMR Appointment Not Necessary  Translation No  No Triage Reason Other   Nurse Assessment  Nurse: Leilani Merl, RN, Heather Date/Time (Eastern Time): 01/10/2016 10:30:41 AM  Confirm and document reason for call. If symptomatic, describe symptoms. You must click the next button to save text entered. ---caller states that her father's blood pressure has been going up lately. He was at a doctor's office yesterday and it was 198/76, she is wondering if he might need to go back on his lisinopril, he was taken off of it within the last year due to his blood pressure being too low. She is not with him at this time. She would like Dr. Yong Channel notified of this and see what he wants to do. Please call her at above number.  Has the patient traveled out of the country within the last 30 days? ---Not Applicable  Does the patient have any new or worsening symptoms? ---Yes  Will a triage be completed? ---No  Select reason for no triage. ---Other     Guidelines      Guideline Title Affirmed Question Affirmed Notes Nurse Date/Time ([place] Time)         Disp. Time Eilene Ghazi Time) Disposition Final User   01/10/2016 10:20:38 AM Send To Clinical Follow Up Rich Brave, Amy

## 2016-01-10 NOTE — Telephone Encounter (Signed)
Mrs. Malachy Mood please get pt scheduled for a BP f/u visit with Dr. Yong Channel one day this week to discuss bp meds.

## 2016-01-10 NOTE — Telephone Encounter (Signed)
Spoke with pt daughter and she said pt can not come in this they are moving.Pt has been scheduled for 01/16/16 at 3:45

## 2016-01-10 NOTE — Telephone Encounter (Signed)
See below

## 2016-01-10 NOTE — Telephone Encounter (Signed)
Looking back at other visits with other physicians BP has been consistently high. My understanding as of last visit as that he was still taking lisinopril but had stopped hctz. I would advise we have patient in sometime soon within a week(can be SDA) and bring all medicines so we can decide what to restart or titrate up. If chest pain or shortness of breath needs to be seen immediately

## 2016-01-16 ENCOUNTER — Other Ambulatory Visit: Payer: Self-pay | Admitting: Family Medicine

## 2016-01-16 ENCOUNTER — Encounter: Payer: Self-pay | Admitting: Family Medicine

## 2016-01-16 ENCOUNTER — Ambulatory Visit (INDEPENDENT_AMBULATORY_CARE_PROVIDER_SITE_OTHER): Payer: Medicare Other | Admitting: Family Medicine

## 2016-01-16 DIAGNOSIS — I10 Essential (primary) hypertension: Secondary | ICD-10-CM | POA: Diagnosis not present

## 2016-01-16 MED ORDER — LISINOPRIL 10 MG PO TABS: 10.0000 mg | ORAL_TABLET | Freq: Every day | ORAL | Status: DC

## 2016-01-16 NOTE — Patient Instructions (Signed)
Let's start back on lisinopril 10mg . Take only 1/2 a pill for now. I sent in #90 to mail order. I will try to reach out to you within a few days of your next oncology visit to see if we need to make adjustments based on your blood pressure.   Continue cardura/doxazosin and imdur/isosorbide mononitrate

## 2016-01-16 NOTE — Assessment & Plan Note (Signed)
S: poor control. Several outside offices have shown BP from 150- up to 190s. Previously lisinopril had been stopped due to low blood pressure and cardura 8mg  and imdur 30mg  continued.  BP Readings from Last 3 Encounters:  01/16/16 150/60  01/06/16 165/48  12/16/15 185/50  A/P:Continue current meds:  But restart lisinopril 10mg - take 1/2 tab for now and will follow up oncology visit to see if improved- may titrate further up.

## 2016-01-16 NOTE — Progress Notes (Signed)
[name], MD  Subjective:  Alexander Rice is a 82 y.o. year old very pleasant male patient who presents for/with See problem oriented charting ROS- No chest pain or shortness of breath. No headache or blurry vision.   Past Medical History-  Patient Active Problem List   Diagnosis Date Noted  . Alzheimer's dementia 03/31/2015    Priority: High  . MDS (myelodysplastic syndrome) ([place]) 05/14/2011    Priority: High  . CAD (coronary artery disease) 09/29/2006    Priority: High  . Hyperlipidemia 09/29/2006    Priority: Medium  . Essential hypertension 09/29/2006    Priority: Medium  . BPH (benign prostatic hyperplasia) 09/29/2006    Priority: Medium  . Spinal stenosis of lumbar region 03/31/2015    Priority: Low  . Pernicious anemia 03/11/2015    Priority: Low  . Glaucoma 12/01/2014    Priority: Low  . Pulmonary nodule 04/08/2012    Priority: Low  . ANEMIA, B12 DEFICIENCY 07/03/2007    Priority: Low  . GERD 09/29/2006    Priority: Low    Medications- reviewed and updated Current Outpatient Prescriptions  Medication Sig Dispense Refill  . aspirin 81 MG tablet Take 81 mg by mouth daily.      . CVS OMEGA-3 KRILL OIL PO Take 350 mg by mouth daily. Reported on 01/16/2016    . dorzolamide (TRUSOPT) 2 % ophthalmic solution Place 1 drop into both eyes 2 (two) times daily.      [name] doxazosin (CARDURA) 8 MG tablet Take 1 tablet by mouth at  bedtime 90 tablet 3  . isosorbide mononitrate (IMDUR) 30 MG 24 hr tablet Take 1 tablet by mouth  daily 90 tablet 3  . LUMIGAN 0.01 % SOLN Place 1 drop into both eyes at bedtime.     . Memantine HCl-Donepezil HCl (NAMZARIC) 28-10 MG CP24 Take 1 capsule by mouth daily. 90 capsule 1  . Multiple Vitamin (MULTIVITAMIN) tablet Take 1 tablet by mouth daily.      [name] omeprazole (PRILOSEC) 20 MG capsule     . Probiotic Product ([facility]S PROBIOTIC) CHEW Chew by mouth daily.    . Pyridoxine HCl (VITAMIN B-6) 250 MG tablet Take 1 tablet (250 mg total) by mouth  daily. 90 tablet 3  . simvastatin (ZOCOR) 40 MG tablet TAKE 1 TABLET BY MOUTH  DAILY AT 6 PM 90 tablet 2  . VESICARE 10 MG tablet Take 10 mg by mouth daily.     . nitroGLYCERIN (NITROSTAT) 0.4 MG SL tablet Place 0.4 mg under the tongue every 5 (five) minutes as needed. Reported on 01/16/2016     No current facility-administered medications for this visit.   Facility-Administered Medications Ordered in Other Visits  Medication Dose Route Frequency Provider Last Rate Last Dose  . darbepoetin (ARANESP) injection 300 mcg  300 mcg Subcutaneous Once [name], MD        Objective: BP 150/60 mmHg  Pulse 68  Temp(Src) 97.8 F (36.6 C)  Wt 150 lb (68.04 kg) Gen: NAD, resting comfortably, remains engaged in conversation CV: RRR no murmurs rubs or gallops Lungs: CTAB no crackles, wheeze, rhonchi Abdomen: soft/nontender/nondistended/normal bowel sounds. Ext: no edema Skin: warm, dry Neuro: grossly normal, moves all extremities  Assessment/Plan:  Essential hypertension S: poor control. Several outside offices have shown BP from 150- up to 190s. Previously lisinopril had been stopped due to low blood pressure and cardura 8mg  and imdur 30mg  continued.  BP Readings from Last 3 Encounters:  01/16/16 150/60  01/06/16  165/48  12/16/15 185/50  A/P:Continue current meds:  But restart lisinopril 10mg - take 1/2 tab for now and will follow up oncology visit to see if improved- may titrate further up.    Return precautions advised.   Verbally instructed only 1/2 pill for now Meds ordered this encounter  Medications  . lisinopril (PRINIVIL,ZESTRIL) 10 MG tablet    Sig: Take 1 tablet (10 mg total) by mouth daily.    Dispense:  90 tablet    Refill:  3

## 2016-01-19 ENCOUNTER — Telehealth: Payer: Self-pay | Admitting: Family Medicine

## 2016-01-19 NOTE — Telephone Encounter (Signed)
Not a very common issue with this medicine. Have him stop medicine for a week and update me if symptoms resolve. We may start a different medication

## 2016-01-19 NOTE — Telephone Encounter (Signed)
See below

## 2016-01-19 NOTE — Telephone Encounter (Signed)
Daughter states every since pt has strting taking the  lisinopril (PRINIVIL,ZESTRIL) 05 MG tablet,  pt has been unable to control his bladder. like major incontinece.  Daughter thinks this med must be the reason, because this all started at the same time he started

## 2016-01-20 NOTE — Telephone Encounter (Signed)
Pt daughter notified   

## 2016-01-24 ENCOUNTER — Telehealth: Payer: Self-pay | Admitting: Family Medicine

## 2016-01-24 NOTE — Telephone Encounter (Signed)
Here u go

## 2016-01-24 NOTE — Telephone Encounter (Signed)
That's fine if they want to wait until oncology visit. Have them reach out to me with BP after visit. I am considering amlodipine.

## 2016-01-24 NOTE — Telephone Encounter (Signed)
Lm for pt to return call. When pt calls back please get pt questions/concerns if im not available.

## 2016-01-24 NOTE — Telephone Encounter (Signed)
Pt  Daughter is calling back to update [place] and she states her dads incontinence has been better since being of the lisinopril. She would like to know if you would not put pt on his visit before next week with Dr. Bluford Main. She states she feels that she would like to wait until the 15 th to see what his BP is at that time and she does not want her dad to be put on a diuretic which she thinks would make his incontinence return.

## 2016-01-24 NOTE — Telephone Encounter (Signed)
Pt daughter called and would like a call back    [phone]

## 2016-01-25 NOTE — Telephone Encounter (Signed)
Lm for pt daughter tcb.

## 2016-02-01 ENCOUNTER — Ambulatory Visit (HOSPITAL_BASED_OUTPATIENT_CLINIC_OR_DEPARTMENT_OTHER): Payer: Medicare Other

## 2016-02-01 ENCOUNTER — Other Ambulatory Visit (HOSPITAL_BASED_OUTPATIENT_CLINIC_OR_DEPARTMENT_OTHER): Payer: Medicare Other

## 2016-02-01 ENCOUNTER — Encounter: Payer: Self-pay | Admitting: Hematology & Oncology

## 2016-02-01 ENCOUNTER — Ambulatory Visit (HOSPITAL_BASED_OUTPATIENT_CLINIC_OR_DEPARTMENT_OTHER): Payer: Medicare Other | Admitting: Hematology & Oncology

## 2016-02-01 VITALS — BP 159/58 | HR 64 | Temp 97.5°F | Resp 16 | Ht 67.0 in | Wt 144.0 lb

## 2016-02-01 DIAGNOSIS — D461 Refractory anemia with ring sideroblasts: Secondary | ICD-10-CM | POA: Diagnosis not present

## 2016-02-01 DIAGNOSIS — M1990 Unspecified osteoarthritis, unspecified site: Secondary | ICD-10-CM | POA: Diagnosis not present

## 2016-02-01 DIAGNOSIS — D510 Vitamin B12 deficiency anemia due to intrinsic factor deficiency: Secondary | ICD-10-CM

## 2016-02-01 DIAGNOSIS — D460 Refractory anemia without ring sideroblasts, so stated: Secondary | ICD-10-CM | POA: Diagnosis not present

## 2016-02-01 DIAGNOSIS — D469 Myelodysplastic syndrome, unspecified: Secondary | ICD-10-CM

## 2016-02-01 DIAGNOSIS — D518 Other vitamin B12 deficiency anemias: Secondary | ICD-10-CM

## 2016-02-01 LAB — COMPREHENSIVE METABOLIC PANEL
ALBUMIN: 3.8 g/dL (ref 3.5–5.0)
ALK PHOS: 55 U/L (ref 40–150)
ALT: 23 U/L (ref 0–55)
ANION GAP: 4 meq/L (ref 3–11)
AST: 22 U/L (ref 5–34)
BILIRUBIN TOTAL: 0.77 mg/dL (ref 0.20–1.20)
BUN: 16.6 mg/dL (ref 7.0–26.0)
CO2: 25 mEq/L (ref 22–29)
CREATININE: 0.9 mg/dL (ref 0.7–1.3)
Calcium: 8.5 mg/dL (ref 8.4–10.4)
Chloride: 99 mEq/L (ref 98–109)
EGFR: 76 mL/min/{1.73_m2} — AB (ref >90)
GLUCOSE: 126 mg/dL (ref 70–140)
Potassium: 4 mEq/L (ref 3.5–5.1)
Sodium: 128 mEq/L — ABNORMAL LOW (ref 136–145)
TOTAL PROTEIN: 5.8 g/dL — AB (ref 6.4–8.3)

## 2016-02-01 LAB — CBC WITH DIFFERENTIAL (CANCER CENTER ONLY)
BASO#: 0 10*3/uL (ref 0.0–0.2)
BASO%: 0.3 % (ref 0.0–2.0)
EOS%: 3.3 % (ref 0.0–7.0)
Eosinophils Absolute: 0.1 10*3/uL (ref 0.0–0.5)
HCT: 25.2 % — ABNORMAL LOW (ref 38.7–49.9)
HGB: 8.8 g/dL — ABNORMAL LOW (ref 13.0–17.1)
LYMPH#: 0.9 10*3/uL (ref 0.9–3.3)
LYMPH%: 24.7 % (ref 14.0–48.0)
MCH: 34.1 pg — ABNORMAL HIGH (ref 28.0–33.4)
MCHC: 34.9 g/dL (ref 32.0–35.9)
MCV: 98 fL (ref 82–98)
MONO#: 0.4 10*3/uL (ref 0.1–0.9)
MONO%: 11.7 % (ref 0.0–13.0)
NEUT#: 2.2 10*3/uL (ref 1.5–6.5)
NEUT%: 60 % (ref 40.0–80.0)
PLATELETS: 353 10*3/uL (ref 145–400)
RBC: 2.58 10*6/uL — ABNORMAL LOW (ref 4.20–5.70)
RDW: 17.9 % — AB (ref 11.1–15.7)
WBC: 3.7 10*3/uL — ABNORMAL LOW (ref 4.0–10.0)

## 2016-02-01 MED ORDER — EPOETIN ALFA 40000 UNIT/ML IJ SOLN
40000.0000 [IU] | Freq: Once | INTRAMUSCULAR | Status: AC
  Administered 2016-02-01: 40000 [IU] via SUBCUTANEOUS

## 2016-02-01 MED ORDER — EPOETIN ALFA 40000 UNIT/ML IJ SOLN
INTRAMUSCULAR | Status: AC
  Filled 2016-02-01: qty 1

## 2016-02-01 NOTE — Progress Notes (Signed)
Hematology and Oncology Follow Up Visit  Alexander Rice [id number] 09-Oct-1932 82 y.o. 02/01/2016   Principle Diagnosis:   Refractory anemia with ringed sideroblasts.  Pernicious anemia  Severe osteoarthritis  Current Therapy:    Procrit 40,000 units subcutaneous every 3 weeks  Vitamin B6 250 mg by mouth daily  Vitamin B-12 1 mg IM every month-done at home     Interim History:  Alexander Rice is back for follow-up.  His hemoglobin is holding pretty steady. He is losing some weight.  I am not sure as to why he is losing weight. He does not have much of an appetite.  I thought maybe we can try some Oxandrin,  which is a anabolic steroid. He does not want to try this yet.   He has had no bleeding. He has had no nausea or vomiting.  His arthritis is a real problem for him.   He has had no change in bowel or bladder habits.   He has had no fever. There's been no leg swelling.   Overall, his performance status is ECOG 2.  Medications:  Current outpatient prescriptions:  [name]  Memantine HCl-Donepezil HCl 28-10 MG CP24, Take 1 capsule by mouth daily., Disp: , Rfl:  .  mirabegron ER (MYRBETRIQ) 50 MG TB24 tablet, Take 50 mg by mouth daily., Disp: , Rfl:  .  aspirin 81 MG tablet, Take 81 mg by mouth daily.  , Disp: , Rfl:  .  CVS OMEGA-3 KRILL OIL PO, Take 350 mg by mouth daily. Reported on 01/16/2016, Disp: , Rfl:  .  dorzolamide (TRUSOPT) 2 % ophthalmic solution, Place 1 drop into both eyes 2 (two) times daily.  , Disp: , Rfl:  .  doxazosin (CARDURA) 8 MG tablet, Take 1 tablet by mouth at  bedtime, Disp: 90 tablet, Rfl: 3 .  isosorbide mononitrate (IMDUR) 30 MG 24 hr tablet, Take 1 tablet by mouth  daily, Disp: 90 tablet, Rfl: 3 .  lisinopril (PRINIVIL,ZESTRIL) 10 MG tablet, Take 1 tablet (10 mg total) by mouth daily., Disp: 90 tablet, Rfl: 3 .  LUMIGAN 0.01 % SOLN, Place 1 drop into both eyes at bedtime. , Disp: , Rfl:  .  Multiple Vitamin (MULTIVITAMIN) tablet, Take 1 tablet by  mouth daily.  , Disp: , Rfl:  .  nitroGLYCERIN (NITROSTAT) 0.4 MG SL tablet, Place 0.4 mg under the tongue every 5 (five) minutes as needed. Reported on 01/16/2016, Disp: , Rfl:  .  omeprazole (PRILOSEC) 20 MG capsule, , Disp: , Rfl:  .  Probiotic Product (CVS PROBIOTIC) CHEW, Chew by mouth daily., Disp: , Rfl:  .  Pyridoxine HCl (VITAMIN B-6) 250 MG tablet, Take 1 tablet (250 mg total) by mouth daily., Disp: 90 tablet, Rfl: 3 .  simvastatin (ZOCOR) 40 MG tablet, TAKE 1 TABLET BY MOUTH  DAILY AT 6 PM, Disp: 90 tablet, Rfl: 2 No current facility-administered medications for this visit.  Facility-Administered Medications Ordered in Other Visits:  .  darbepoetin (ARANESP) injection 300 mcg, 300 mcg, Subcutaneous, Once, Alexander Napoleon, MD  Allergies: No Known Allergies  Past Medical History, Surgical history, Social history, and Family History were reviewed and updated.  Review of Systems: As above  Physical Exam:  height is 5\' 7"  (1.702 m) and weight is 144 lb (65.318 kg). His oral temperature is 97.5 F (36.4 C). His blood pressure is 159/58 and his pulse is 64. His respiration is 16.   Wt Readings from Last 3 Encounters:  02/01/16 144 lb (  65.318 kg)  01/16/16 150 lb (68.04 kg)  01/06/16 151 lb (68.493 kg)     Elderly white gentleman in no obvious distress. Head and neck exam shows no ocular or oral lesions. There are no palpable cervical or supraclavicular lymph nodes. Lungs are clear. Cardiac exam regular rate and rhythm with no murmurs, rubs or bruits. Abdomen is soft. He has good bowel sounds. There is no fluid wave. There is no palpable liver or spleen tip. Back exam shows some kyphosis. Extremities shows some age-related osteoarthritic changes. He has decent range of motion of his joints. Skin exam shows no rashes, ecchymoses or petechia.  Lab Results  Component Value Date   WBC 3.7* 02/01/2016   HGB 8.8* 02/01/2016   HCT 25.2* 02/01/2016   MCV 98 02/01/2016   PLT 353  02/01/2016     Chemistry      Component Value Date/Time   NA 128* 02/01/2016 1316   NA 134* 08/11/2015 0928   NA 134 05/17/2015 0936   K 4.0 02/01/2016 1316   K 4.2 08/11/2015 0928   K 4.5 05/17/2015 0936   CL 102 08/11/2015 0928   CL 103 05/17/2015 0936   CO2 25 02/01/2016 1316   CO2 24 08/11/2015 0928   CO2 27 05/17/2015 0936   BUN 16.6 02/01/2016 1316   BUN 14 08/11/2015 0928   BUN 11 05/17/2015 0936   CREATININE 0.9 02/01/2016 1316   CREATININE 1.03 08/11/2015 0928   CREATININE 1.0 05/17/2015 0936      Component Value Date/Time   CALCIUM 8.5 02/01/2016 1316   CALCIUM 8.8 08/11/2015 0928   CALCIUM 8.8 05/17/2015 0936   ALKPHOS 55 02/01/2016 1316   ALKPHOS 42 08/11/2015 0928   ALKPHOS 43 05/17/2015 0936   AST 22 02/01/2016 1316   AST 18 08/11/2015 0928   AST 26 05/17/2015 0936   ALT 23 02/01/2016 1316   ALT 15 08/11/2015 0928   ALT 25 05/17/2015 0936   BILITOT 0.77 02/01/2016 1316   BILITOT 1.0 08/11/2015 0928   BILITOT 1.10 05/17/2015 0936         Impression and Plan: [name] is 82 year old gentleman with myelodysplasia. He has refractory anemia with ringed sideroblasts.   I will go ahead and give him Procrit today.  We will still have him come back every 3 weeks for a CBC and possible Procrit. I am a little worried about the weight loss.  It is possible of the weight loss is from the underlying myelodysplasia.   We will see him back in 3 weeks. His wife and daughter were with him today.    Alexander Napoleon, MD 3/15/20175:14 PM

## 2016-02-01 NOTE — Patient Instructions (Signed)
Epoetin Alfa injection What is this medicine? EPOETIN ALFA (e POE e tin AL fa) helps your body make more red blood cells. This medicine is used to treat anemia caused by chronic kidney failure, cancer chemotherapy, or HIV-therapy. It may also be used before surgery if you have anemia. This medicine may be used for other purposes; ask your health care provider or pharmacist if you have questions. What should I tell my health care provider before I take this medicine? They need to know if you have any of these conditions: -blood clotting disorders -cancer patient not on chemotherapy -cystic fibrosis -heart disease, such as angina or heart failure -hemoglobin level of 12 g/dL or greater -high blood pressure -low levels of folate, iron, or vitamin B12 -seizures -an unusual or allergic reaction to erythropoietin, albumin, benzyl alcohol, hamster proteins, other medicines, foods, dyes, or preservatives -pregnant or trying to get pregnant -breast-feeding How should I use this medicine? This medicine is for injection into a vein or under the skin. It is usually given by a health care professional in a hospital or clinic setting. If you get this medicine at home, you will be taught how to prepare and give this medicine. Use exactly as directed. Take your medicine at regular intervals. Do not take your medicine more often than directed. It is important that you put your used needles and syringes in a special sharps container. Do not put them in a trash can. If you do not have a sharps container, call your pharmacist or healthcare provider to get one. Talk to your pediatrician regarding the use of this medicine in children. While this drug may be prescribed for selected conditions, precautions do apply. Overdosage: If you think you have taken too much of this medicine contact a poison control center or emergency room at once. NOTE: This medicine is only for you. Do not share this medicine with  others. What if I miss a dose? If you miss a dose, take it as soon as you can. If it is almost time for your next dose, take only that dose. Do not take double or extra doses. What may interact with this medicine? Do not take this medicine with any of the following medications: -darbepoetin alfa This list may not describe all possible interactions. Give your health care provider a list of all the medicines, herbs, non-prescription drugs, or dietary supplements you use. Also tell them if you smoke, drink alcohol, or use illegal drugs. Some items may interact with your medicine. What should I watch for while using this medicine? Visit your prescriber or health care professional for regular checks on your progress and for the needed blood tests and blood pressure measurements. It is especially important for the doctor to make sure your hemoglobin level is in the desired range, to limit the risk of potential side effects and to give you the best benefit. Keep all appointments for any recommended tests. Check your blood pressure as directed. Ask your doctor what your blood pressure should be and when you should contact him or her. As your body makes more red blood cells, you may need to take iron, folic acid, or vitamin B supplements. Ask your doctor or health care provider which products are right for you. If you have kidney disease continue dietary restrictions, even though this medication can make you feel better. Talk with your doctor or health care professional about the foods you eat and the vitamins that you take. What side effects may I notice   from receiving this medicine? Side effects that you should report to your doctor or health care professional as soon as possible: -allergic reactions like skin rash, itching or hives, swelling of the face, lips, or tongue -breathing problems -changes in vision -chest pain -confusion, trouble speaking or understanding -feeling faint or lightheaded,  falls -high blood pressure -muscle aches or pains -pain, swelling, warmth in the leg -rapid weight gain -severe headaches -sudden numbness or weakness of the face, arm or leg -trouble walking, dizziness, loss of balance or coordination -seizures (convulsions) -swelling of the ankles, feet, hands -unusually weak or tired Side effects that usually do not require medical attention (report to your doctor or health care professional if they continue or are bothersome): -diarrhea -fever, chills (flu-like symptoms) -headaches -nausea, vomiting -redness, stinging, or swelling at site where injected This list may not describe all possible side effects. Call your doctor for medical advice about side effects. You may report side effects to FDA at 1-800-FDA-1088. Where should I keep my medicine? Keep out of the reach of children. Store in a refrigerator between 2 and 8 degrees C (36 and 46 degrees F). Do not freeze or shake. Throw away any unused portion if using a single-dose vial. Multi-dose vials can be kept in the refrigerator for up to 21 days after the initial dose. Throw away unused medicine. NOTE: This sheet is a summary. It may not cover all possible information. If you have questions about this medicine, talk to your doctor, pharmacist, or health care provider.    2016, [employer] Standard. (2008-10-19 10:25:44)  

## 2016-02-02 LAB — IRON AND TIBC
%SAT: 68 % — ABNORMAL HIGH (ref 20–55)
IRON: 121 ug/dL (ref 42–163)
TIBC: 179 ug/dL — AB (ref 202–409)
UIBC: 58 ug/dL — AB (ref 117–376)

## 2016-02-02 LAB — FERRITIN

## 2016-02-02 LAB — RETICULOCYTES: Reticulocyte Count: 0.6 % (ref 0.6–2.6)

## 2016-02-03 ENCOUNTER — Telehealth: Payer: Self-pay | Admitting: Family Medicine

## 2016-02-03 MED ORDER — DOXAZOSIN MESYLATE 8 MG PO TABS: 8.0000 mg | ORAL_TABLET | Freq: Every day | ORAL | Status: DC

## 2016-02-03 NOTE — Telephone Encounter (Signed)
Rx done. 

## 2016-02-03 NOTE — Telephone Encounter (Signed)
[name] returned your call.  Pt is also out of  doxazosin (CARDURA) 8 MG tablet.  Pt's mailorder of this med will not arrive until next wed. Should pt have a week supply   called in, or is it ok to go without.  New pharm  [facility] [address]

## 2016-02-06 MED ORDER — LISINOPRIL 2.5 MG PO TABS
2.5000 mg | ORAL_TABLET | Freq: Every day | ORAL | Status: DC
Start: 2016-02-06 — End: 2016-02-20

## 2016-02-06 NOTE — Telephone Encounter (Signed)
Alexander Rice- I sent in an even smaller dose at 2.5 mg. See if he has the same side effects on this. If he does next step would likely be amlodipine but i would have to change cholesterol dose and since that dose has worked well hesitate to change

## 2016-02-06 NOTE — Telephone Encounter (Signed)
Pt daughter notified   

## 2016-02-06 NOTE — Addendum Note (Signed)
Addended by: Marin Olp on: 02/06/2016 02:43 PM   Modules accepted: Orders

## 2016-02-20 ENCOUNTER — Telehealth: Payer: Self-pay | Admitting: Family Medicine

## 2016-02-20 MED ORDER — LISINOPRIL 2.5 MG PO TABS: 2.5000 mg | ORAL_TABLET | Freq: Every day | ORAL | Status: DC

## 2016-02-20 NOTE — Telephone Encounter (Signed)
Lisinopril sent in  

## 2016-02-20 NOTE — Telephone Encounter (Signed)
Pt request refill of the following:  lisinopril (PRINIVIL,ZESTRIL) 2.5 MG tablet   Daughter said he is tolerating well and is requesting a rx be sent to below pharmacy    Phamacy:  [facility]

## 2016-02-22 ENCOUNTER — Encounter: Payer: Self-pay | Admitting: Podiatry

## 2016-02-22 ENCOUNTER — Ambulatory Visit (INDEPENDENT_AMBULATORY_CARE_PROVIDER_SITE_OTHER): Payer: Medicare Other | Admitting: Podiatry

## 2016-02-22 ENCOUNTER — Ambulatory Visit: Payer: Medicare Other | Admitting: Hematology & Oncology

## 2016-02-22 ENCOUNTER — Ambulatory Visit: Payer: Medicare Other

## 2016-02-22 ENCOUNTER — Encounter: Payer: Self-pay | Admitting: Hematology & Oncology

## 2016-02-22 ENCOUNTER — Other Ambulatory Visit: Payer: Medicare Other

## 2016-02-22 ENCOUNTER — Other Ambulatory Visit (HOSPITAL_BASED_OUTPATIENT_CLINIC_OR_DEPARTMENT_OTHER): Payer: Medicare Other

## 2016-02-22 ENCOUNTER — Ambulatory Visit (HOSPITAL_BASED_OUTPATIENT_CLINIC_OR_DEPARTMENT_OTHER): Payer: Medicare Other | Admitting: Hematology & Oncology

## 2016-02-22 ENCOUNTER — Ambulatory Visit (HOSPITAL_BASED_OUTPATIENT_CLINIC_OR_DEPARTMENT_OTHER): Payer: Medicare Other

## 2016-02-22 VITALS — BP 164/51 | HR 57 | Temp 97.3°F | Resp 14 | Ht 67.0 in | Wt 141.0 lb

## 2016-02-22 DIAGNOSIS — M1990 Unspecified osteoarthritis, unspecified site: Secondary | ICD-10-CM | POA: Diagnosis not present

## 2016-02-22 DIAGNOSIS — D510 Vitamin B12 deficiency anemia due to intrinsic factor deficiency: Secondary | ICD-10-CM | POA: Diagnosis not present

## 2016-02-22 DIAGNOSIS — B351 Tinea unguium: Secondary | ICD-10-CM

## 2016-02-22 DIAGNOSIS — D461 Refractory anemia with ring sideroblasts: Secondary | ICD-10-CM

## 2016-02-22 DIAGNOSIS — M79675 Pain in left toe(s): Secondary | ICD-10-CM

## 2016-02-22 DIAGNOSIS — M79676 Pain in unspecified toe(s): Secondary | ICD-10-CM | POA: Diagnosis not present

## 2016-02-22 DIAGNOSIS — D469 Myelodysplastic syndrome, unspecified: Secondary | ICD-10-CM

## 2016-02-22 DIAGNOSIS — R634 Abnormal weight loss: Secondary | ICD-10-CM | POA: Diagnosis not present

## 2016-02-22 DIAGNOSIS — D460 Refractory anemia without ring sideroblasts, so stated: Secondary | ICD-10-CM | POA: Diagnosis not present

## 2016-02-22 DIAGNOSIS — D518 Other vitamin B12 deficiency anemias: Secondary | ICD-10-CM

## 2016-02-22 LAB — CBC WITH DIFFERENTIAL (CANCER CENTER ONLY)
BASO#: 0 10*3/uL (ref 0.0–0.2)
BASO%: 0 % (ref 0.0–2.0)
EOS%: 5.1 % (ref 0.0–7.0)
Eosinophils Absolute: 0.2 10*3/uL (ref 0.0–0.5)
HCT: 24.1 % — ABNORMAL LOW (ref 38.7–49.9)
HGB: 8.3 g/dL — ABNORMAL LOW (ref 13.0–17.1)
LYMPH#: 1.3 10*3/uL (ref 0.9–3.3)
LYMPH%: 38.3 % (ref 14.0–48.0)
MCH: 34.2 pg — ABNORMAL HIGH (ref 28.0–33.4)
MCHC: 34.4 g/dL (ref 32.0–35.9)
MCV: 99 fL — ABNORMAL HIGH (ref 82–98)
MONO#: 0.4 10*3/uL (ref 0.1–0.9)
MONO%: 11.4 % (ref 0.0–13.0)
NEUT#: 1.5 10*3/uL (ref 1.5–6.5)
NEUT%: 45.2 % (ref 40.0–80.0)
PLATELETS: 381 10*3/uL (ref 145–400)
RBC: 2.43 10*6/uL — ABNORMAL LOW (ref 4.20–5.70)
RDW: 18.8 % — ABNORMAL HIGH (ref 11.1–15.7)
WBC: 3.3 10*3/uL — AB (ref 4.0–10.0)

## 2016-02-22 LAB — IRON AND TIBC
%SAT: 39 % (ref 20–55)
Iron: 63 ug/dL (ref 42–163)
TIBC: 163 ug/dL — AB (ref 202–409)
UIBC: 99 ug/dL — AB (ref 117–376)

## 2016-02-22 LAB — CMP (CANCER CENTER ONLY)
ALK PHOS: 40 U/L (ref 26–84)
ALT: 16 U/L (ref 10–47)
AST: 21 U/L (ref 11–38)
Albumin: 3.2 g/dL — ABNORMAL LOW (ref 3.3–5.5)
BILIRUBIN TOTAL: 0.9 mg/dL (ref 0.20–1.60)
BUN: 17 mg/dL (ref 7–22)
CALCIUM: 8.8 mg/dL (ref 8.0–10.3)
CO2: 26 mEq/L (ref 18–33)
Chloride: 101 mEq/L (ref 98–108)
Creat: 1.1 mg/dl (ref 0.6–1.2)
Glucose, Bld: 102 mg/dL (ref 73–118)
POTASSIUM: 4.3 meq/L (ref 3.3–4.7)
Sodium: 131 mEq/L (ref 128–145)
TOTAL PROTEIN: 5.4 g/dL — AB (ref 6.4–8.1)

## 2016-02-22 LAB — FERRITIN

## 2016-02-22 MED ORDER — EPOETIN ALFA 40000 UNIT/ML IJ SOLN
INTRAMUSCULAR | Status: AC
  Filled 2016-02-22: qty 1

## 2016-02-22 MED ORDER — EPOETIN ALFA 40000 UNIT/ML IJ SOLN
40000.0000 [IU] | Freq: Once | INTRAMUSCULAR | Status: AC
  Administered 2016-02-22: 40000 [IU] via SUBCUTANEOUS

## 2016-02-22 NOTE — Progress Notes (Signed)
Hematology and Oncology Follow Up Visit  KOOPER REICHELDERFER [id number] [birth year] 82 y.o. 02/22/2016   Principle Diagnosis:   Refractory anemia with ringed sideroblasts.  Pernicious anemia  Severe osteoarthritis  Current Therapy:    Procrit 40,000 units subcutaneous every 3 weeks  Vitamin B6 250 mg by mouth daily  Vitamin B-12 1 mg IM every month-done at home     Interim History:  Mr. Bouwman is back for follow-up.  He seems to be a low bit disoriented this morning. He may be having a TIA. I think he has had these for. His wife said he had one recently.  Over about 10 minutes, he seemed to get back to his regular baseline status.  Back in mid March, his ferritin was 1131. Iron saturation was 68%.  He's had no obvious bleeding.  His appetite seems redoing okay. Again, he is losing weight. This might be from the underlying myelodysplasia and a hypermetabolic state that he is in.    Overall, his performance status is ECOG 2.  Medications:  Current outpatient prescriptions:  .  aspirin 81 MG tablet, Take 81 mg by mouth daily.  , Disp: , Rfl:  .  [facility] OMEGA-3 KRILL OIL PO, Take 350 mg by mouth daily. Reported on 01/16/2016, Disp: , Rfl:  .  dorzolamide (TRUSOPT) 2 % ophthalmic solution, Place 1 drop into both eyes 2 (two) times daily.  , Disp: , Rfl:  .  doxazosin (CARDURA) 8 MG tablet, Take 1 tablet (8 mg total) by mouth at bedtime., Disp: 30 tablet, Rfl: 0 .  isosorbide mononitrate (IMDUR) 30 MG 24 hr tablet, Take 1 tablet by mouth  daily, Disp: 90 tablet, Rfl: 3 .  lisinopril (PRINIVIL,ZESTRIL) 2.5 MG tablet, Take 1 tablet (2.5 mg total) by mouth daily., Disp: 30 tablet, Rfl: 5 .  LUMIGAN 0.01 % SOLN, Place 1 drop into both eyes at bedtime. , Disp: , Rfl:  .  Memantine HCl-Donepezil HCl 28-10 MG CP24, Take 1 capsule by mouth daily., Disp: , Rfl:  .  mirabegron ER (MYRBETRIQ) 50 MG TB24 tablet, Take 50 mg by mouth daily., Disp: , Rfl:  .  Multiple Vitamin (MULTIVITAMIN)  tablet, Take 1 tablet by mouth daily.  , Disp: , Rfl:  .  nitroGLYCERIN (NITROSTAT) 0.4 MG SL tablet, Place 0.4 mg under the tongue every 5 (five) minutes as needed. Reported on 01/16/2016, Disp: , Rfl:  .  omeprazole (PRILOSEC) 20 MG capsule, , Disp: , Rfl:  .  Probiotic Product ([employer] PROBIOTIC) CHEW, Chew by mouth daily., Disp: , Rfl:  .  Pyridoxine HCl (VITAMIN B-6) 250 MG tablet, Take 1 tablet (250 mg total) by mouth daily., Disp: 90 tablet, Rfl: 3 .  simvastatin (ZOCOR) 40 MG tablet, TAKE 1 TABLET BY MOUTH  DAILY AT 6 PM, Disp: 90 tablet, Rfl: 2 No current facility-administered medications for this visit.  Facility-Administered Medications Ordered in Other Visits:  .  darbepoetin (ARANESP) injection 300 mcg, 300 mcg, Subcutaneous, Once, [name], MD  Allergies: No Known Allergies  Past Medical History, Surgical history, Social history, and Family History were reviewed and updated.  Review of Systems: As above  Physical Exam:  height is 5\' 7"  (1.702 m) and weight is 141 lb (63.957 kg). His oral temperature is 97.3 F (36.3 C). His blood pressure is 164/51 and his pulse is 57. His respiration is 14.   Wt Readings from Last 3 Encounters:  02/22/16 141 lb (63.957 kg)  02/01/16 144 lb (65.318 kg)  01/16/16 150 lb (68.04 kg)     Elderly white gentleman in no obvious distress. Head and neck exam shows no ocular or oral lesions. There are no palpable cervical or supraclavicular lymph nodes. Lungs are clear. Cardiac exam regular rate and rhythm with no murmurs, rubs or bruits. Abdomen is soft. He has good bowel sounds. There is no fluid wave. There is no palpable liver or spleen tip. Back exam shows some kyphosis. Extremities shows some age-related osteoarthritic changes. He has decent range of motion of his joints. Skin exam shows no rashes, ecchymoses or petechia.  Lab Results  Component Value Date   WBC 3.3* 02/22/2016   HGB 8.3* 02/22/2016   HCT 24.1* 02/22/2016   MCV 99*  02/22/2016   PLT 381 02/22/2016     Chemistry      Component Value Date/Time   NA 131 02/22/2016 1050   NA 128* 02/01/2016 1316   NA 134* 08/11/2015 0928   K 4.3 02/22/2016 1050   K 4.0 02/01/2016 1316   K 4.2 08/11/2015 0928   CL 101 02/22/2016 1050   CL 102 08/11/2015 0928   CO2 26 02/22/2016 1050   CO2 25 02/01/2016 1316   CO2 24 08/11/2015 0928   BUN 17 02/22/2016 1050   BUN 16.6 02/01/2016 1316   BUN 14 08/11/2015 0928   CREATININE 1.1 02/22/2016 1050   CREATININE 0.9 02/01/2016 1316   CREATININE 1.03 08/11/2015 0928      Component Value Date/Time   CALCIUM 8.8 02/22/2016 1050   CALCIUM 8.5 02/01/2016 1316   CALCIUM 8.8 08/11/2015 0928   ALKPHOS 40 02/22/2016 1050   ALKPHOS 55 02/01/2016 1316   ALKPHOS 42 08/11/2015 0928   AST 21 02/22/2016 1050   AST 22 02/01/2016 1316   AST 18 08/11/2015 0928   ALT 16 02/22/2016 1050   ALT 23 02/01/2016 1316   ALT 15 08/11/2015 0928   BILITOT 0.90 02/22/2016 1050   BILITOT 0.77 02/01/2016 1316   BILITOT 1.0 08/11/2015 0928         Impression and Plan: Mr. Gehman is 82 year old gentleman with myelodysplasia. He has refractory anemia with ringed sideroblasts.   I will go ahead and give him Procrit today.  We will still have him come back every 3 weeks for a CBC and possible Procrit. I am a little worried about the weight loss.  It is possible of the weight loss is from the underlying myelodysplasia.   We will see him back in 3 weeks. His wife and daughter were with him today.    [name], MD 4/5/201711:51 AM

## 2016-02-22 NOTE — Addendum Note (Signed)
Addended by: Ezzard Flax, Laquasia Pincus L on: 02/22/2016 04:27 PM   Modules accepted: Medications

## 2016-02-22 NOTE — Patient Instructions (Signed)
Epoetin Alfa injection What is this medicine? EPOETIN ALFA (e POE e tin AL fa) helps your body make more red blood cells. This medicine is used to treat anemia caused by chronic kidney failure, cancer chemotherapy, or HIV-therapy. It may also be used before surgery if you have anemia. This medicine may be used for other purposes; ask your health care provider or pharmacist if you have questions. What should I tell my health care provider before I take this medicine? They need to know if you have any of these conditions: -blood clotting disorders -cancer patient not on chemotherapy -cystic fibrosis -heart disease, such as angina or heart failure -hemoglobin level of 12 g/dL or greater -high blood pressure -low levels of folate, iron, or vitamin B12 -seizures -an unusual or allergic reaction to erythropoietin, albumin, benzyl alcohol, hamster proteins, other medicines, foods, dyes, or preservatives -pregnant or trying to get pregnant -breast-feeding How should I use this medicine? This medicine is for injection into a vein or under the skin. It is usually given by a health care professional in a hospital or clinic setting. If you get this medicine at home, you will be taught how to prepare and give this medicine. Use exactly as directed. Take your medicine at regular intervals. Do not take your medicine more often than directed. It is important that you put your used needles and syringes in a special sharps container. Do not put them in a trash can. If you do not have a sharps container, call your pharmacist or healthcare provider to get one. Talk to your pediatrician regarding the use of this medicine in children. While this drug may be prescribed for selected conditions, precautions do apply. Overdosage: If you think you have taken too much of this medicine contact a poison control center or emergency room at once. NOTE: This medicine is only for you. Do not share this medicine with  others. What if I miss a dose? If you miss a dose, take it as soon as you can. If it is almost time for your next dose, take only that dose. Do not take double or extra doses. What may interact with this medicine? Do not take this medicine with any of the following medications: -darbepoetin alfa This list may not describe all possible interactions. Give your health care provider a list of all the medicines, herbs, non-prescription drugs, or dietary supplements you use. Also tell them if you smoke, drink alcohol, or use illegal drugs. Some items may interact with your medicine. What should I watch for while using this medicine? Visit your prescriber or health care professional for regular checks on your progress and for the needed blood tests and blood pressure measurements. It is especially important for the doctor to make sure your hemoglobin level is in the desired range, to limit the risk of potential side effects and to give you the best benefit. Keep all appointments for any recommended tests. Check your blood pressure as directed. Ask your doctor what your blood pressure should be and when you should contact him or her. As your body makes more red blood cells, you may need to take iron, folic acid, or vitamin B supplements. Ask your doctor or health care provider which products are right for you. If you have kidney disease continue dietary restrictions, even though this medication can make you feel better. Talk with your doctor or health care professional about the foods you eat and the vitamins that you take. What side effects may I notice   from receiving this medicine? Side effects that you should report to your doctor or health care professional as soon as possible: -allergic reactions like skin rash, itching or hives, swelling of the face, lips, or tongue -breathing problems -changes in vision -chest pain -confusion, trouble speaking or understanding -feeling faint or lightheaded,  falls -high blood pressure -muscle aches or pains -pain, swelling, warmth in the leg -rapid weight gain -severe headaches -sudden numbness or weakness of the face, arm or leg -trouble walking, dizziness, loss of balance or coordination -seizures (convulsions) -swelling of the ankles, feet, hands -unusually weak or tired Side effects that usually do not require medical attention (report to your doctor or health care professional if they continue or are bothersome): -diarrhea -fever, chills (flu-like symptoms) -headaches -nausea, vomiting -redness, stinging, or swelling at site where injected This list may not describe all possible side effects. Call your doctor for medical advice about side effects. You may report side effects to FDA at 1-800-FDA-1088. Where should I keep my medicine? Keep out of the reach of children. Store in a refrigerator between 2 and 8 degrees C (36 and 46 degrees F). Do not freeze or shake. Throw away any unused portion if using a single-dose vial. Multi-dose vials can be kept in the refrigerator for up to 21 days after the initial dose. Throw away unused medicine. NOTE: This sheet is a summary. It may not cover all possible information. If you have questions about this medicine, talk to your doctor, pharmacist, or health care provider.    2016, [employer] Standard. (2008-10-19 10:25:44)  

## 2016-02-22 NOTE — Progress Notes (Signed)
Patient ID: Alexander Rice, male   DOB: 01/26/1932, 83 y.o.   MRN: [id number] HPI  Complaint:  Visit Type: Patient returns to my office for continued preventative foot care services. Complaint: Patient states" my nails have grown long and thick and become painful to walk and wear shoes. He presents for preventative foot care services. No changes to ROS  Podiatric Exam: Vascular: dorsalis pedis and posterior tibial pulses are negative. Capillary return is diminished.. Temperature gradient is negative. Skin turgor WNL,   Sensorium: Normal Semmes Weinstein monofilament test. Normal tactile sensation bilaterally.  Nail Exam: Pt has thick disfigured discolored nails with subungual debris noted bilateral entire nail hallux  Ulcer Exam: There is no evidence of ulcer or pre-ulcerative changes or infection. Orthopedic Exam: Muscle tone and strength are WNL. No limitations in general ROM. No crepitus or effusions noted. Foot type and digits show no abnormalities. Bony prominences are unremarkable. Skin: No Porokeratosis. No infection or ulcers  Diagnosis:  Tinea unguium, Pain in right toe, pain in left toes  Treatment & Plan Procedures and Treatment: Consent by patient was obtained for treatment procedures. The patient understood the discussion of treatment and procedures well. All questions were answered thoroughly reviewed. Debridement of mycotic and hypertrophic toenails, 1 through 5 bilateral and clearing of subungual debris. No ulceration, no infection noted.  Return Visit-Office Procedure: Patient instructed to return to the office for a follow up visit 3 months for continued evaluation and treatment.   Ozell Juhasz DPM  

## 2016-02-23 LAB — RETICULOCYTES: RETICULOCYTE COUNT: 0.6 % (ref 0.6–2.6)

## 2016-03-15 ENCOUNTER — Ambulatory Visit (HOSPITAL_BASED_OUTPATIENT_CLINIC_OR_DEPARTMENT_OTHER): Payer: Medicare Other

## 2016-03-15 ENCOUNTER — Ambulatory Visit (HOSPITAL_BASED_OUTPATIENT_CLINIC_OR_DEPARTMENT_OTHER): Payer: Medicare Other | Admitting: Family

## 2016-03-15 ENCOUNTER — Encounter: Payer: Self-pay | Admitting: Family

## 2016-03-15 ENCOUNTER — Other Ambulatory Visit (HOSPITAL_BASED_OUTPATIENT_CLINIC_OR_DEPARTMENT_OTHER): Payer: Medicare Other

## 2016-03-15 VITALS — BP 152/54 | HR 70 | Temp 97.9°F | Resp 16 | Ht 67.0 in | Wt 151.0 lb

## 2016-03-15 DIAGNOSIS — D461 Refractory anemia with ring sideroblasts: Secondary | ICD-10-CM

## 2016-03-15 DIAGNOSIS — D469 Myelodysplastic syndrome, unspecified: Secondary | ICD-10-CM

## 2016-03-15 LAB — CMP (CANCER CENTER ONLY)
ALBUMIN: 3.2 g/dL — AB (ref 3.3–5.5)
ALK PHOS: 45 U/L (ref 26–84)
ALT: 19 U/L (ref 10–47)
AST: 21 U/L (ref 11–38)
BUN: 20 mg/dL (ref 7–22)
CHLORIDE: 101 meq/L (ref 98–108)
CO2: 25 mEq/L (ref 18–33)
Calcium: 8.3 mg/dL (ref 8.0–10.3)
Creat: 1.2 mg/dl (ref 0.6–1.2)
Glucose, Bld: 92 mg/dL (ref 73–118)
POTASSIUM: 4.2 meq/L (ref 3.3–4.7)
Sodium: 133 mEq/L (ref 128–145)
TOTAL PROTEIN: 5.6 g/dL — AB (ref 6.4–8.1)
Total Bilirubin: 1 mg/dl (ref 0.20–1.60)

## 2016-03-15 LAB — CBC WITH DIFFERENTIAL (CANCER CENTER ONLY)
BASO#: 0 10*3/uL (ref 0.0–0.2)
BASO%: 0.2 % (ref 0.0–2.0)
EOS ABS: 0.2 10*3/uL (ref 0.0–0.5)
EOS%: 2.7 % (ref 0.0–7.0)
HEMATOCRIT: 24.5 % — AB (ref 38.7–49.9)
HEMOGLOBIN: 8.5 g/dL — AB (ref 13.0–17.1)
LYMPH#: 1.6 10*3/uL (ref 0.9–3.3)
LYMPH%: 25.8 % (ref 14.0–48.0)
MCH: 35 pg — AB (ref 28.0–33.4)
MCHC: 34.7 g/dL (ref 32.0–35.9)
MCV: 101 fL — AB (ref 82–98)
MONO#: 0.6 10*3/uL (ref 0.1–0.9)
MONO%: 10 % (ref 0.0–13.0)
NEUT%: 61.3 % (ref 40.0–80.0)
NEUTROS ABS: 3.9 10*3/uL (ref 1.5–6.5)
Platelets: 443 10*3/uL — ABNORMAL HIGH (ref 145–400)
RBC: 2.43 10*6/uL — AB (ref 4.20–5.70)
RDW: 19.8 % — ABNORMAL HIGH (ref 11.1–15.7)
WBC: 6.3 10*3/uL (ref 4.0–10.0)

## 2016-03-15 MED ORDER — EPOETIN ALFA 40000 UNIT/ML IJ SOLN
40000.0000 [IU] | Freq: Once | INTRAMUSCULAR | Status: AC
  Administered 2016-03-15: 40000 [IU] via SUBCUTANEOUS

## 2016-03-15 NOTE — Patient Instructions (Signed)
Epoetin Alfa injection What is this medicine? EPOETIN ALFA (e POE e tin AL fa) helps your body make more red blood cells. This medicine is used to treat anemia caused by chronic kidney failure, cancer chemotherapy, or HIV-therapy. It may also be used before surgery if you have anemia. This medicine may be used for other purposes; ask your health care provider or pharmacist if you have questions. What should I tell my health care provider before I take this medicine? They need to know if you have any of these conditions: -blood clotting disorders -cancer patient not on chemotherapy -cystic fibrosis -heart disease, such as angina or heart failure -hemoglobin level of 12 g/dL or greater -high blood pressure -low levels of folate, iron, or vitamin B12 -seizures -an unusual or allergic reaction to erythropoietin, albumin, benzyl alcohol, hamster proteins, other medicines, foods, dyes, or preservatives -pregnant or trying to get pregnant -breast-feeding How should I use this medicine? This medicine is for injection into a vein or under the skin. It is usually given by a health care professional in a hospital or clinic setting. If you get this medicine at home, you will be taught how to prepare and give this medicine. Use exactly as directed. Take your medicine at regular intervals. Do not take your medicine more often than directed. It is important that you put your used needles and syringes in a special sharps container. Do not put them in a trash can. If you do not have a sharps container, call your pharmacist or healthcare provider to get one. Talk to your pediatrician regarding the use of this medicine in children. While this drug may be prescribed for selected conditions, precautions do apply. Overdosage: If you think you have taken too much of this medicine contact a poison control center or emergency room at once. NOTE: This medicine is only for you. Do not share this medicine with  others. What if I miss a dose? If you miss a dose, take it as soon as you can. If it is almost time for your next dose, take only that dose. Do not take double or extra doses. What may interact with this medicine? Do not take this medicine with any of the following medications: -darbepoetin alfa This list may not describe all possible interactions. Give your health care provider a list of all the medicines, herbs, non-prescription drugs, or dietary supplements you use. Also tell them if you smoke, drink alcohol, or use illegal drugs. Some items may interact with your medicine. What should I watch for while using this medicine? Visit your prescriber or health care professional for regular checks on your progress and for the needed blood tests and blood pressure measurements. It is especially important for the doctor to make sure your hemoglobin level is in the desired range, to limit the risk of potential side effects and to give you the best benefit. Keep all appointments for any recommended tests. Check your blood pressure as directed. Ask your doctor what your blood pressure should be and when you should contact him or her. As your body makes more red blood cells, you may need to take iron, folic acid, or vitamin B supplements. Ask your doctor or health care provider which products are right for you. If you have kidney disease continue dietary restrictions, even though this medication can make you feel better. Talk with your doctor or health care professional about the foods you eat and the vitamins that you take. What side effects may I notice   from receiving this medicine? Side effects that you should report to your doctor or health care professional as soon as possible: -allergic reactions like skin rash, itching or hives, swelling of the face, lips, or tongue -breathing problems -changes in vision -chest pain -confusion, trouble speaking or understanding -feeling faint or lightheaded,  falls -high blood pressure -muscle aches or pains -pain, swelling, warmth in the leg -rapid weight gain -severe headaches -sudden numbness or weakness of the face, arm or leg -trouble walking, dizziness, loss of balance or coordination -seizures (convulsions) -swelling of the ankles, feet, hands -unusually weak or tired Side effects that usually do not require medical attention (report to your doctor or health care professional if they continue or are bothersome): -diarrhea -fever, chills (flu-like symptoms) -headaches -nausea, vomiting -redness, stinging, or swelling at site where injected This list may not describe all possible side effects. Call your doctor for medical advice about side effects. You may report side effects to FDA at 1-800-FDA-1088. Where should I keep my medicine? Keep out of the reach of children. Store in a refrigerator between 2 and 8 degrees C (36 and 46 degrees F). Do not freeze or shake. Throw away any unused portion if using a single-dose vial. Multi-dose vials can be kept in the refrigerator for up to 21 days after the initial dose. Throw away unused medicine. NOTE: This sheet is a summary. It may not cover all possible information. If you have questions about this medicine, talk to your doctor, pharmacist, or health care provider.    2016, [employer] Standard. (2008-10-19 10:25:44)  

## 2016-03-15 NOTE — Progress Notes (Signed)
Hematology and Oncology Follow Up Visit  Alexander Rice [id number] Feb 04, 1932 82 y.o. 03/15/2016   Principle Diagnosis:  Anemia of renal insufficiency  Refractory anemia with ringed sideroblasts. Pernicious anemia  Current Therapy:   Procrit 40,000 units subcutaneous monthly Vitamin B6 250 mg by mouth daily    Interim History: Alexander Rice is here today with is wife and daughter for a follow-up. He is feeling much better since his last visit. He is oriented x3 today and has had no other TIA episodes. His Hgb is 8.5 today. He has had no episodes of bleeding. He is on one baby aspirin daily and does bruise easily.  No lymphadenopathy found on exam. No falls or syncopal episodes.   No fever, chills, n/v, cough, rash, headache, dizziness, vision changes, SOB, chest pain, palpitations, abdominal pain or changes in bowel or bladder habits.  No swelling, tenderness, numbness or tingling in his extremities. No c/o joint aches or bone pain at this time.  He has a good appetite and is staying well hydrated. His weight is now up 10 lbs.   Medications:    Medication List       This list is accurate as of: 03/15/16  1:57 PM.  Always use your most recent med list.               aspirin 81 MG tablet  Take 81 mg by mouth daily.     CVS OMEGA-3 KRILL OIL PO  Take 350 mg by mouth daily. Reported on 01/16/2016     [facility] PROBIOTIC Chew  Chew by mouth daily.     dorzolamide 2 % ophthalmic solution  Commonly known as:  TRUSOPT  Place 1 drop into both eyes 2 (two) times daily.     doxazosin 8 MG tablet  Commonly known as:  CARDURA  Take 1 tablet (8 mg total) by mouth at bedtime.     isosorbide mononitrate 30 MG 24 hr tablet  Commonly known as:  IMDUR  Take 1 tablet by mouth  daily     lisinopril 2.5 MG tablet  Commonly known as:  PRINIVIL,ZESTRIL  Take 1 tablet (2.5 mg total) by mouth daily.     LUMIGAN 0.01 % Soln  Generic drug:  bimatoprost  Place 1 drop into both eyes at  bedtime.     Memantine HCl-Donepezil HCl 28-10 MG Cp24  Take 1 capsule by mouth daily.     multivitamin tablet  Take 1 tablet by mouth daily.     MYRBETRIQ 50 MG Tb24 tablet  Generic drug:  mirabegron ER  Take 50 mg by mouth daily.     nitroGLYCERIN 0.4 MG SL tablet  Commonly known as:  NITROSTAT  Place 0.4 mg under the tongue every 5 (five) minutes as needed. Reported on 01/16/2016     omeprazole 20 MG capsule  Commonly known as:  PRILOSEC     simvastatin 40 MG tablet  Commonly known as:  ZOCOR  TAKE 1 TABLET BY MOUTH  DAILY AT 6 PM     vitamin B-6 250 MG tablet  Take 1 tablet (250 mg total) by mouth daily.        Allergies: No Known Allergies  Past Medical History, Surgical history, Social history, and Family History were reviewed and updated.  Review of Systems: All other 10 point review of systems is negative.   Physical Exam:  height is 5\' 7"  (1.702 m) and weight is 151 lb (68.493 kg). His oral temperature is 97.9 F (  36.6 C). His blood pressure is 152/54 and his pulse is 70. His respiration is 16.   Wt Readings from Last 3 Encounters:  03/15/16 151 lb (68.493 kg)  02/22/16 141 lb (63.957 kg)  02/01/16 144 lb (65.318 kg)    Ocular: Sclerae unicteric, pupils equal, round and reactive to light Ear-nose-throat: Oropharynx clear, dentition fair Lymphatic: No cervical supraclavicular or axillary adenopathy Lungs no rales or rhonchi, good excursion bilaterally Heart regular rate and rhythm, no murmur appreciated Abd soft, nontender, positive bowel sounds, no liver or spleen tip palpated on exam, no fluid wave  MSK no focal spinal tenderness, no joint edema Neuro: non-focal, well-oriented, appropriate affect Breast: Deferred   Lab Results  Component Value Date   WBC 6.3 03/15/2016   HGB 8.5* 03/15/2016   HCT 24.5* 03/15/2016   MCV 101* 03/15/2016   PLT 443* 03/15/2016   Lab Results  Component Value Date   FERRITIN 1,163* 02/22/2016   IRON 63 02/22/2016     TIBC 163* 02/22/2016   UIBC 99* 02/22/2016   IRONPCTSAT 39 02/22/2016   Lab Results  Component Value Date   RETICCTPCT 1.0 11/25/2015   RBC 2.43* 03/15/2016   RETICCTABS 27.2 11/25/2015   Lab Results  Component Value Date   KPAFRELGTCHN 3.09* 04/22/2008   LAMBDASER 1.01 04/22/2008   KAPLAMBRATIO 3.06* 04/22/2008   No results found for: Kandis Cocking, IGMSERUM Lab Results  Component Value Date   TOTALPROTELP 6.3 04/22/2008   ALBUMINELP 66.3* 04/22/2008   A1GS 3.9 04/22/2008   A2GS 8.1 04/22/2008   BETS 5.2 04/22/2008   BETA2SER 4.0 04/22/2008   GAMS 12.5 04/22/2008   MSPIKE NOT DET 04/22/2008   SPEI * 04/22/2008     Chemistry      Component Value Date/Time   NA 133 03/15/2016 1326   NA 128* 02/01/2016 1316   NA 134* 08/11/2015 0928   K 4.2 03/15/2016 1326   K 4.0 02/01/2016 1316   K 4.2 08/11/2015 0928   CL 101 03/15/2016 1326   CL 102 08/11/2015 0928   CO2 25 03/15/2016 1326   CO2 25 02/01/2016 1316   CO2 24 08/11/2015 0928   BUN 20 03/15/2016 1326   BUN 16.6 02/01/2016 1316   BUN 14 08/11/2015 0928   CREATININE 1.2 03/15/2016 1326   CREATININE 0.9 02/01/2016 1316   CREATININE 1.03 08/11/2015 0928      Component Value Date/Time   CALCIUM 8.3 03/15/2016 1326   CALCIUM 8.5 02/01/2016 1316   CALCIUM 8.8 08/11/2015 0928   ALKPHOS 45 03/15/2016 1326   ALKPHOS 55 02/01/2016 1316   ALKPHOS 42 08/11/2015 0928   AST 21 03/15/2016 1326   AST 22 02/01/2016 1316   AST 18 08/11/2015 0928   ALT 19 03/15/2016 1326   ALT 23 02/01/2016 1316   ALT 15 08/11/2015 0928   BILITOT 1.00 03/15/2016 1326   BILITOT 0.77 02/01/2016 1316   BILITOT 1.0 08/11/2015 0928     Impression and Plan: Alexander Rice is an 82 yo white male with myelodysplasia and refractory anemia with ringed sideroblasts. He is feeling better and has gained some weight back since his last visit.  His Hgb is stable at 8.5 with an MCV of 101. We will give him Procrit today per Dr. Marin Olp.  We will plan  to see him back in 3 weeks for lab work, follow-up and injection.   He will contact us with any questions or concerns. We can certainly see him sooner if need be.  Eliezer Bottom, NP 4/27/20171:57 PM

## 2016-03-16 LAB — IRON AND TIBC
%SAT: 43 % (ref 20–55)
Iron: 79 ug/dL (ref 42–163)
TIBC: 181 ug/dL — AB (ref 202–409)
UIBC: 103 ug/dL — AB (ref 117–376)

## 2016-03-16 LAB — FERRITIN: FERRITIN: 929 ng/mL — AB (ref 22–316)

## 2016-03-16 LAB — RETICULOCYTES: RETICULOCYTE COUNT: 0.7 % (ref 0.6–2.6)

## 2016-04-05 ENCOUNTER — Encounter: Payer: Self-pay | Admitting: Family

## 2016-04-05 ENCOUNTER — Other Ambulatory Visit (HOSPITAL_BASED_OUTPATIENT_CLINIC_OR_DEPARTMENT_OTHER): Payer: Medicare Other

## 2016-04-05 ENCOUNTER — Ambulatory Visit (HOSPITAL_BASED_OUTPATIENT_CLINIC_OR_DEPARTMENT_OTHER): Payer: Medicare Other | Admitting: Family

## 2016-04-05 ENCOUNTER — Ambulatory Visit (HOSPITAL_BASED_OUTPATIENT_CLINIC_OR_DEPARTMENT_OTHER): Payer: Medicare Other

## 2016-04-05 VITALS — BP 140/62 | HR 71 | Temp 97.5°F | Resp 14 | Ht 67.0 in | Wt 144.0 lb

## 2016-04-05 DIAGNOSIS — N189 Chronic kidney disease, unspecified: Secondary | ICD-10-CM

## 2016-04-05 DIAGNOSIS — D469 Myelodysplastic syndrome, unspecified: Secondary | ICD-10-CM

## 2016-04-05 DIAGNOSIS — D510 Vitamin B12 deficiency anemia due to intrinsic factor deficiency: Secondary | ICD-10-CM | POA: Diagnosis not present

## 2016-04-05 DIAGNOSIS — D631 Anemia in chronic kidney disease: Secondary | ICD-10-CM | POA: Diagnosis not present

## 2016-04-05 DIAGNOSIS — D461 Refractory anemia with ring sideroblasts: Secondary | ICD-10-CM

## 2016-04-05 DIAGNOSIS — D460 Refractory anemia without ring sideroblasts, so stated: Secondary | ICD-10-CM | POA: Diagnosis not present

## 2016-04-05 DIAGNOSIS — D518 Other vitamin B12 deficiency anemias: Secondary | ICD-10-CM

## 2016-04-05 LAB — COMPREHENSIVE METABOLIC PANEL
ALBUMIN: 3.7 g/dL (ref 3.5–5.0)
ALK PHOS: 55 U/L (ref 40–150)
ALT: 15 U/L (ref 0–55)
ANION GAP: 4 meq/L (ref 3–11)
AST: 16 U/L (ref 5–34)
BUN: 18.4 mg/dL (ref 7.0–26.0)
CALCIUM: 8.5 mg/dL (ref 8.4–10.4)
CO2: 25 mEq/L (ref 22–29)
Chloride: 103 mEq/L (ref 98–109)
Creatinine: 1 mg/dL (ref 0.7–1.3)
EGFR: 72 mL/min/{1.73_m2} — AB (ref >90)
Glucose: 101 mg/dl (ref 70–140)
Potassium: 4.2 mEq/L (ref 3.5–5.1)
Sodium: 131 mEq/L — ABNORMAL LOW (ref 136–145)
Total Bilirubin: 0.63 mg/dL (ref 0.20–1.20)
Total Protein: 5.7 g/dL — ABNORMAL LOW (ref 6.4–8.3)

## 2016-04-05 LAB — CBC WITH DIFFERENTIAL (CANCER CENTER ONLY)
BASO#: 0 10*3/uL (ref 0.0–0.2)
BASO%: 0.2 % (ref 0.0–2.0)
EOS%: 1.3 % (ref 0.0–7.0)
Eosinophils Absolute: 0.1 10*3/uL (ref 0.0–0.5)
HCT: 23.9 % — ABNORMAL LOW (ref 38.7–49.9)
HGB: 8.2 g/dL — ABNORMAL LOW (ref 13.0–17.1)
LYMPH#: 1.5 10*3/uL (ref 0.9–3.3)
LYMPH%: 18.3 % (ref 14.0–48.0)
MCH: 33.7 pg — ABNORMAL HIGH (ref 28.0–33.4)
MCHC: 34.3 g/dL (ref 32.0–35.9)
MCV: 98 fL (ref 82–98)
MONO#: 0.6 10*3/uL (ref 0.1–0.9)
MONO%: 7.4 % (ref 0.0–13.0)
NEUT%: 72.8 % (ref 40.0–80.0)
NEUTROS ABS: 5.9 10*3/uL (ref 1.5–6.5)
PLATELETS: 517 10*3/uL — AB (ref 145–400)
RBC: 2.43 10*6/uL — AB (ref 4.20–5.70)
RDW: 20 % — ABNORMAL HIGH (ref 11.1–15.7)
WBC: 8.2 10*3/uL (ref 4.0–10.0)

## 2016-04-05 MED ORDER — EPOETIN ALFA 40000 UNIT/ML IJ SOLN
INTRAMUSCULAR | Status: AC
  Filled 2016-04-05: qty 1

## 2016-04-05 MED ORDER — EPOETIN ALFA 40000 UNIT/ML IJ SOLN
40000.0000 [IU] | Freq: Once | INTRAMUSCULAR | Status: AC
  Administered 2016-04-05: 40000 [IU] via SUBCUTANEOUS

## 2016-04-05 NOTE — Patient Instructions (Signed)
Epoetin Alfa injection What is this medicine? EPOETIN ALFA (e POE e tin AL fa) helps your body make more red blood cells. This medicine is used to treat anemia caused by chronic kidney failure, cancer chemotherapy, or HIV-therapy. It may also be used before surgery if you have anemia. This medicine may be used for other purposes; ask your health care provider or pharmacist if you have questions. What should I tell my health care provider before I take this medicine? They need to know if you have any of these conditions: -blood clotting disorders -cancer patient not on chemotherapy -cystic fibrosis -heart disease, such as angina or heart failure -hemoglobin level of 12 g/dL or greater -high blood pressure -low levels of folate, iron, or vitamin B12 -seizures -an unusual or allergic reaction to erythropoietin, albumin, benzyl alcohol, hamster proteins, other medicines, foods, dyes, or preservatives -pregnant or trying to get pregnant -breast-feeding How should I use this medicine? This medicine is for injection into a vein or under the skin. It is usually given by a health care professional in a hospital or clinic setting. If you get this medicine at home, you will be taught how to prepare and give this medicine. Use exactly as directed. Take your medicine at regular intervals. Do not take your medicine more often than directed. It is important that you put your used needles and syringes in a special sharps container. Do not put them in a trash can. If you do not have a sharps container, call your pharmacist or healthcare provider to get one. Talk to your pediatrician regarding the use of this medicine in children. While this drug may be prescribed for selected conditions, precautions do apply. Overdosage: If you think you have taken too much of this medicine contact a poison control center or emergency room at once. NOTE: This medicine is only for you. Do not share this medicine with  others. What if I miss a dose? If you miss a dose, take it as soon as you can. If it is almost time for your next dose, take only that dose. Do not take double or extra doses. What may interact with this medicine? Do not take this medicine with any of the following medications: -darbepoetin alfa This list may not describe all possible interactions. Give your health care provider a list of all the medicines, herbs, non-prescription drugs, or dietary supplements you use. Also tell them if you smoke, drink alcohol, or use illegal drugs. Some items may interact with your medicine. What should I watch for while using this medicine? Visit your prescriber or health care professional for regular checks on your progress and for the needed blood tests and blood pressure measurements. It is especially important for the doctor to make sure your hemoglobin level is in the desired range, to limit the risk of potential side effects and to give you the best benefit. Keep all appointments for any recommended tests. Check your blood pressure as directed. Ask your doctor what your blood pressure should be and when you should contact him or her. As your body makes more red blood cells, you may need to take iron, folic acid, or vitamin B supplements. Ask your doctor or health care provider which products are right for you. If you have kidney disease continue dietary restrictions, even though this medication can make you feel better. Talk with your doctor or health care professional about the foods you eat and the vitamins that you take. What side effects may I notice   from receiving this medicine? Side effects that you should report to your doctor or health care professional as soon as possible: -allergic reactions like skin rash, itching or hives, swelling of the face, lips, or tongue -breathing problems -changes in vision -chest pain -confusion, trouble speaking or understanding -feeling faint or lightheaded,  falls -high blood pressure -muscle aches or pains -pain, swelling, warmth in the leg -rapid weight gain -severe headaches -sudden numbness or weakness of the face, arm or leg -trouble walking, dizziness, loss of balance or coordination -seizures (convulsions) -swelling of the ankles, feet, hands -unusually weak or tired Side effects that usually do not require medical attention (report to your doctor or health care professional if they continue or are bothersome): -diarrhea -fever, chills (flu-like symptoms) -headaches -nausea, vomiting -redness, stinging, or swelling at site where injected This list may not describe all possible side effects. Call your doctor for medical advice about side effects. You may report side effects to FDA at 1-800-FDA-1088. Where should I keep my medicine? Keep out of the reach of children. Store in a refrigerator between 2 and 8 degrees C (36 and 46 degrees F). Do not freeze or shake. Throw away any unused portion if using a single-dose vial. Multi-dose vials can be kept in the refrigerator for up to 21 days after the initial dose. Throw away unused medicine. NOTE: This sheet is a summary. It may not cover all possible information. If you have questions about this medicine, talk to your doctor, pharmacist, or health care provider.    2016, [employer] Standard. (2008-10-19 10:25:44)  

## 2016-04-05 NOTE — Progress Notes (Signed)
Hematology and Oncology Follow Up Visit  Alexander Rice [id number] 09-Nov-1932 82 y.o. 04/05/2016   Principle Diagnosis:  Anemia of renal insufficiency  Refractory anemia with ringed sideroblasts. Pernicious anemia  Current Therapy:   Procrit 40,000 units subcutaneous monthly Vitamin B6 250 mg by mouth daily    Interim History: [name] is here today with is wife and daughter for a follow-up. He is doing well and has no complaints at this time. He recently had a tooth extracted and has only just started eating soft foods again. His weight is down 7 lbs today. He is supplementing between meals with the high protein Ensure. His appetite is good and he is staying well hydrated.  His Hgb is 8.2 today. His platelet count is up a little today at 517. We will see what his iron studies show.  He has had no episodes of bleeding. He is on one baby aspirin daily and does bruise easily.  No fever, chills, n/v, cough, rash, headache, dizziness, vision changes, SOB, chest pain, palpitations, abdominal pain or changes in bowel or bladder habits.  No lymphadenopathy found on exam. No falls or syncopal episodes.   No swelling, tenderness, numbness or tingling in his extremities. No c/o joint aches or bone pain.   Medications:    Medication List       This list is accurate as of: 04/05/16  2:32 PM.  Always use your most recent med list.               aspirin 81 MG tablet  Take 81 mg by mouth daily.     CVS OMEGA-3 KRILL OIL PO  Take 350 mg by mouth daily. Reported on 01/16/2016     [facility]S PROBIOTIC Chew  Chew by mouth daily.     dorzolamide 2 % ophthalmic solution  Commonly known as:  TRUSOPT  Place 1 drop into both eyes 2 (two) times daily.     doxazosin 8 MG tablet  Commonly known as:  CARDURA  Take 1 tablet (8 mg total) by mouth at bedtime.     isosorbide mononitrate 30 MG 24 hr tablet  Commonly known as:  IMDUR  Take 1 tablet by mouth  daily     lisinopril 2.5 MG tablet    Commonly known as:  PRINIVIL,ZESTRIL  Take 1 tablet (2.5 mg total) by mouth daily.     LUMIGAN 0.01 % Soln  Generic drug:  bimatoprost  Place 1 drop into both eyes at bedtime.     Memantine HCl-Donepezil HCl 28-10 MG Cp24  Take 1 capsule by mouth daily.     multivitamin tablet  Take 1 tablet by mouth daily.     MYRBETRIQ 50 MG Tb24 tablet  Generic drug:  mirabegron ER  Take 50 mg by mouth daily.     nitroGLYCERIN 0.4 MG SL tablet  Commonly known as:  NITROSTAT  Place 0.4 mg under the tongue every 5 (five) minutes as needed. Reported on 01/16/2016     omeprazole 20 MG capsule  Commonly known as:  PRILOSEC     simvastatin 40 MG tablet  Commonly known as:  ZOCOR  TAKE 1 TABLET BY MOUTH  DAILY AT 6 PM     vitamin B-6 250 MG tablet  Take 1 tablet (250 mg total) by mouth daily.        Allergies: No Known Allergies  Past Medical History, Surgical history, Social history, and Family History were reviewed and updated.  Review of Systems: All  other 10 point review of systems is negative.   Physical Exam:  height is 5\' 7"  (1.702 m) and weight is 144 lb (65.318 kg). His oral temperature is 97.5 F (36.4 C). His blood pressure is 140/62 and his pulse is 71. His respiration is 14.   Wt Readings from Last 3 Encounters:  04/05/16 144 lb (65.318 kg)  03/15/16 151 lb (68.493 kg)  02/22/16 141 lb (63.957 kg)    Ocular: Sclerae unicteric, pupils equal, round and reactive to light Ear-nose-throat: Oropharynx clear, dentition fair Lymphatic: No cervical supraclavicular or axillary adenopathy Lungs no rales or rhonchi, good excursion bilaterally Heart regular rate and rhythm, no murmur appreciated Abd soft, nontender, positive bowel sounds, no liver or spleen tip palpated on exam, no fluid wave  MSK no focal spinal tenderness, no joint edema Neuro: non-focal, well-oriented, appropriate affect Breast: Deferred   Lab Results  Component Value Date   WBC 8.2 04/05/2016   HGB  8.2* 04/05/2016   HCT 23.9* 04/05/2016   MCV 98 04/05/2016   PLT 517* 04/05/2016   Lab Results  Component Value Date   FERRITIN 929* 03/15/2016   IRON 79 03/15/2016   TIBC 181* 03/15/2016   UIBC 103* 03/15/2016   IRONPCTSAT 43 03/15/2016   Lab Results  Component Value Date   RETICCTPCT 1.0 11/25/2015   RBC 2.43* 04/05/2016   RETICCTABS 27.2 11/25/2015   Lab Results  Component Value Date   KPAFRELGTCHN 3.09* 04/22/2008   LAMBDASER 1.01 04/22/2008   KAPLAMBRATIO 3.06* 04/22/2008   No results found for: Kandis Cocking, IGMSERUM Lab Results  Component Value Date   TOTALPROTELP 6.3 04/22/2008   ALBUMINELP 66.3* 04/22/2008   A1GS 3.9 04/22/2008   A2GS 8.1 04/22/2008   BETS 5.2 04/22/2008   BETA2SER 4.0 04/22/2008   GAMS 12.5 04/22/2008   MSPIKE NOT DET 04/22/2008   SPEI * 04/22/2008     Chemistry      Component Value Date/Time   NA 133 03/15/2016 1326   NA 128* 02/01/2016 1316   NA 134* 08/11/2015 0928   K 4.2 03/15/2016 1326   K 4.0 02/01/2016 1316   K 4.2 08/11/2015 0928   CL 101 03/15/2016 1326   CL 102 08/11/2015 0928   CO2 25 03/15/2016 1326   CO2 25 02/01/2016 1316   CO2 24 08/11/2015 0928   BUN 20 03/15/2016 1326   BUN 16.6 02/01/2016 1316   BUN 14 08/11/2015 0928   CREATININE 1.2 03/15/2016 1326   CREATININE 0.9 02/01/2016 1316   CREATININE 1.03 08/11/2015 0928      Component Value Date/Time   CALCIUM 8.3 03/15/2016 1326   CALCIUM 8.5 02/01/2016 1316   CALCIUM 8.8 08/11/2015 0928   ALKPHOS 45 03/15/2016 1326   ALKPHOS 55 02/01/2016 1316   ALKPHOS 42 08/11/2015 0928   AST 21 03/15/2016 1326   AST 22 02/01/2016 1316   AST 18 08/11/2015 0928   ALT 19 03/15/2016 1326   ALT 23 02/01/2016 1316   ALT 15 08/11/2015 0928   BILITOT 1.00 03/15/2016 1326   BILITOT 0.77 02/01/2016 1316   BILITOT 1.0 08/11/2015 0928     Impression and Plan: [name] is an 82 yo white male with myelodysplasia and refractory anemia with ringed sideroblasts. He  continues to do well and has no complaints at this time.  His Hgb is stable at 8.2 with an MCV of 98. He will receive a dose of Procrit today.  His platelet count today is 517. We will  see what his iron studies show.  We will plan to see him back in 3 weeks for lab work, follow-up and injection.   He will contact us with any questions or concerns. We can certainly see him sooner if need be.   Eliezer Bottom, NP 5/18/20172:32 PM

## 2016-04-06 LAB — IRON AND TIBC CHCC
IRON: 104 ug/dL (ref 38–169)
Iron Saturation: 55 % (ref 15–55)
Total Iron Binding Capacity: 190 ug/dL — ABNORMAL LOW (ref 250–450)
UIBC: 86 ug/dL — AB (ref 111–343)

## 2016-04-06 LAB — FERRITIN CHCC: Ferritin: 1045 ng/mL — ABNORMAL HIGH (ref 30–400)

## 2016-04-06 LAB — RETICULOCYTES: RETICULOCYTE COUNT: 1.2 % (ref 0.6–2.6)

## 2016-04-18 ENCOUNTER — Ambulatory Visit (INDEPENDENT_AMBULATORY_CARE_PROVIDER_SITE_OTHER): Payer: Medicare Other | Admitting: Family Medicine

## 2016-04-18 ENCOUNTER — Encounter: Payer: Self-pay | Admitting: Family Medicine

## 2016-04-18 VITALS — BP 142/74 | HR 72 | Temp 97.4°F | Ht 65.0 in | Wt 142.0 lb

## 2016-04-18 DIAGNOSIS — F0280 Dementia in other diseases classified elsewhere without behavioral disturbance: Secondary | ICD-10-CM

## 2016-04-18 DIAGNOSIS — Z23 Encounter for immunization: Secondary | ICD-10-CM | POA: Diagnosis not present

## 2016-04-18 DIAGNOSIS — E785 Hyperlipidemia, unspecified: Secondary | ICD-10-CM

## 2016-04-18 DIAGNOSIS — N400 Enlarged prostate without lower urinary tract symptoms: Secondary | ICD-10-CM

## 2016-04-18 DIAGNOSIS — W19XXXA Unspecified fall, initial encounter: Secondary | ICD-10-CM

## 2016-04-18 DIAGNOSIS — Z0000 Encounter for general adult medical examination without abnormal findings: Secondary | ICD-10-CM

## 2016-04-18 DIAGNOSIS — I25119 Atherosclerotic heart disease of native coronary artery with unspecified angina pectoris: Secondary | ICD-10-CM

## 2016-04-18 DIAGNOSIS — E538 Deficiency of other specified B group vitamins: Secondary | ICD-10-CM | POA: Diagnosis not present

## 2016-04-18 DIAGNOSIS — G309 Alzheimer's disease, unspecified: Secondary | ICD-10-CM

## 2016-04-18 DIAGNOSIS — K219 Gastro-esophageal reflux disease without esophagitis: Secondary | ICD-10-CM

## 2016-04-18 DIAGNOSIS — I10 Essential (primary) hypertension: Secondary | ICD-10-CM

## 2016-04-18 LAB — LIPID PANEL
Cholesterol: 117 mg/dL (ref 0–200)
HDL: 58.8 mg/dL (ref >39.00)
LDL CALC: 49 mg/dL (ref 0–99)
NONHDL: 57.99
Total CHOL/HDL Ratio: 2
Triglycerides: 46 mg/dL (ref 0.0–149.0)
VLDL: 9.2 mg/dL (ref 0.0–40.0)

## 2016-04-18 LAB — COMPREHENSIVE METABOLIC PANEL
ALBUMIN: 4.1 g/dL (ref 3.5–5.2)
ALT: 14 U/L (ref 0–53)
AST: 16 U/L (ref 0–37)
Alkaline Phosphatase: 42 U/L (ref 39–117)
BUN: 25 mg/dL — AB (ref 6–23)
CHLORIDE: 104 meq/L (ref 96–112)
CO2: 26 mEq/L (ref 19–32)
CREATININE: 0.97 mg/dL (ref 0.40–1.50)
Calcium: 8.9 mg/dL (ref 8.4–10.5)
GFR: 78.84 mL/min (ref >60.00)
GLUCOSE: 90 mg/dL (ref 70–99)
POTASSIUM: 4.8 meq/L (ref 3.5–5.1)
SODIUM: 134 meq/L — AB (ref 135–145)
TOTAL PROTEIN: 5.7 g/dL — AB (ref 6.0–8.3)
Total Bilirubin: 1.3 mg/dL — ABNORMAL HIGH (ref 0.2–1.2)

## 2016-04-18 LAB — CBC WITH DIFFERENTIAL/PLATELET
BASOS ABS: 0 10*3/uL (ref 0.0–0.1)
Basophils Relative: 0.4 % (ref 0.0–3.0)
EOS ABS: 0.1 10*3/uL (ref 0.0–0.7)
Eosinophils Relative: 2.8 % (ref 0.0–5.0)
LYMPHS PCT: 25.5 % (ref 12.0–46.0)
Lymphs Abs: 1.1 10*3/uL (ref 0.7–4.0)
MCHC: 33.2 g/dL (ref 30.0–36.0)
MCV: 100.3 fl — ABNORMAL HIGH (ref 78.0–100.0)
MONO ABS: 0.3 10*3/uL (ref 0.1–1.0)
Monocytes Relative: 6.8 % (ref 3.0–12.0)
Neutro Abs: 2.7 10*3/uL (ref 1.4–7.7)
Neutrophils Relative %: 64.5 % (ref 43.0–77.0)
Platelets: 476 10*3/uL — ABNORMAL HIGH (ref 150.0–400.0)
RBC: 2.58 Mil/uL — AB (ref 4.22–5.81)
RDW: 23 % — ABNORMAL HIGH (ref 11.5–15.5)
WBC: 4.2 10*3/uL (ref 4.0–10.5)

## 2016-04-18 LAB — POC URINALSYSI DIPSTICK (AUTOMATED)
BILIRUBIN UA: NEGATIVE
GLUCOSE UA: NEGATIVE
Ketones, UA: NEGATIVE
Leukocytes, UA: NEGATIVE
Nitrite, UA: NEGATIVE
Protein, UA: NEGATIVE
RBC UA: NEGATIVE
Spec Grav, UA: 1.015
UROBILINOGEN UA: 1
pH, UA: 6

## 2016-04-18 LAB — VITAMIN B12

## 2016-04-18 NOTE — Progress Notes (Signed)
Phone: [phone]  Subjective:  Patient presents today for their annual wellness visit.    Preventive Screening-Counseling & Management  Smoking Status: former Smoker Second Hand Smoking status: No smokers in home  Risk Factors Regular exercise: walking breez[place]ay 3-4x a day at apartment Diet: working on snacking more to avoid weight loss  Fall Risk: None  Lately but about 3 months ago before he moved he fell once on carpet. Got tripped up. Was not using cane.  Fall Risk  04/18/2016 04/18/2016 04/05/2016 03/15/2016 02/22/2016  Falls in the past year? - Yes No No No  Number falls in past yr: - 1 - - -  Injury with Fall? - No - - -  Risk Factor Category  - - - - -  Risk for fall due to : Impaired balance/gait Impaired balance/gait - - -  Follow up Education provided - - - -   Cardiac risk factors:  advanced age (older than 70 for men, 47 for women)  Hyperlipidemia good control No diabetes.  Personal history CAD- see below Hypertension- we made a joint decision to not make adjustments- daughter fearful of SE, family aware of risks of increased heart attack or stroke   Depression Screen None. PHQ2 0 on repeat- has had some struggles not being able to drive which has frustrated him  Activities of Daily Living Dependent on some ADLs in feeding himself but wife cuts meats,  Clothes himself for most part, mostly toilets for himself, needs assist with bathing. No IADLs.   Hearing Difficulties: -patient declines  Cognitive Testing Known dementia- following with urology  List the Names of Other Physician/Practitioners you currently use: -Dr. Herbert Deaner optho -Dr. Vergia Alcon and Dr. Marin Olp of oncology - Dr. Prudence Davidson podiatry - Dr. Delice Lesch neurology - Dr. Diona Fanti - Dr. Melvyn Novas pulmonology  Immunization History  Administered Date(s) Administered  . Pneumococcal Polysaccharide-23 11/27/2007  . Td 10/07/2008   Required Immunizations needed today Prevnar 13  Screening tests- up  to date Health Maintenance Due  Topic Date Due  . ZOSTAVAX - declined 09/21/1994  . PNA vac Low Risk Adult (2 of 2 - PCV13)- done today  11/26/2008   ROS- No pertinent positives discovered in course of AWV ROS- No chest pain or shortness of breath. No headache or blurry vision.   The following were reviewed and entered/updated in epic: Past Medical History  Diagnosis Date  . CAD (coronary artery disease)   . GERD (gastroesophageal reflux disease)   . Hyperlipidemia   . Hypertension   . BPH (benign prostatic hyperplasia)   . CVD (cardiovascular disease)   . Adenomatous colon polyp 02/1996, 02/2011    TA polyp 02/2011  . B12 deficiency   . Anemia in chronic renal disease 11/02/2011  . Esophageal stricture   . Diverticulosis   . Hemorrhoids   . MDS (myelodysplastic syndrome) ([place])   . Glaucoma   . Heart palpitations   . Hiatal hernia   . History of colonic polyps 09/29/2006    Polyps age 20, Dr. Fuller Plan stated no further colonoscopy due to age. Confirmed with daughter given alzheimers, CAD history     Patient Active Problem List   Diagnosis Date Noted  . Alzheimer's dementia 03/31/2015    Priority: High  . MDS (myelodysplastic syndrome) ([place]) 05/14/2011    Priority: High  . CAD (coronary artery disease) 09/29/2006    Priority: High  . Hyperlipidemia 09/29/2006    Priority: Medium  . Essential hypertension 09/29/2006    Priority: Medium  .  BPH (benign prostatic hyperplasia) 09/29/2006    Priority: Medium  . Spinal stenosis of lumbar region 03/31/2015    Priority: Low  . Pernicious anemia 03/11/2015    Priority: Low  . Glaucoma 12/01/2014    Priority: Low  . Pulmonary nodule 04/08/2012    Priority: Low  . ANEMIA, B12 DEFICIENCY 07/03/2007    Priority: Low  . GERD 09/29/2006    Priority: Low   Past Surgical History  Procedure Laterality Date  . Carotid endarterectomy    . Ptca      stent  . Quadriceps repair      muscle attachment  . Colonoscopy    . Inguinal  hernia repair      right side/ twice  . Tonsillectomy    . Glaucoma surgery Bilateral 11/19/12    pt's report    Family History  Problem Relation Age of Onset  . COPD Mother   . Alcohol abuse Father     Medications- reviewed and updated Current Outpatient Prescriptions  Medication Sig Dispense Refill  . aspirin 81 MG tablet Take 81 mg by mouth daily.      . CVS OMEGA-3 KRILL OIL PO Take 350 mg by mouth daily. Reported on 01/16/2016    . dorzolamide (TRUSOPT) 2 % ophthalmic solution Place 1 drop into both eyes 2 (two) times daily.      [name] doxazosin (CARDURA) 8 MG tablet Take 1 tablet (8 mg total) by mouth at bedtime. 30 tablet 0  . isosorbide mononitrate (IMDUR) 30 MG 24 hr tablet Take 1 tablet by mouth  daily 90 tablet 3  . lisinopril (PRINIVIL,ZESTRIL) 2.5 MG tablet Take 1 tablet (2.5 mg total) by mouth daily. 30 tablet 5  . LUMIGAN 0.01 % SOLN Place 1 drop into both eyes at bedtime.     . Memantine HCl-Donepezil HCl 28-10 MG CP24 Take 1 capsule by mouth daily.    . mirabegron ER (MYRBETRIQ) 50 MG TB24 tablet Take 50 mg by mouth daily.    . Multiple Vitamin (MULTIVITAMIN) tablet Take 1 tablet by mouth daily.      [name] omeprazole (PRILOSEC) 20 MG capsule     . Probiotic Product (CVS PROBIOTIC) CHEW Chew by mouth daily.    . Pyridoxine HCl (VITAMIN B-6) 250 MG tablet Take 1 tablet (250 mg total) by mouth daily. 90 tablet 3  . simvastatin (ZOCOR) 40 MG tablet TAKE 1 TABLET BY MOUTH  DAILY AT 6 PM 90 tablet 2   No current facility-administered medications for this visit.   Facility-Administered Medications Ordered in Other Visits  Medication Dose Route Frequency Provider Last Rate Last Dose  . darbepoetin (ARANESP) injection 300 mcg  300 mcg Subcutaneous Once [name], MD        Allergies-reviewed and updated No Known Allergies  Social History   Social History  . Marital Status: Married    Spouse Name: N/A  . Number of Children: 3  . Years of Education: N/A    Occupational History  . Retired    Social History Main Topics  . Smoking status: Former Smoker    Quit date: 11/19/1968  . Smokeless tobacco: Never Used     Comment: never used tobacco  . Alcohol Use: 4.2 oz/week    7 Standard drinks or equivalent per week     Comment: Wine/Beer  . Drug Use: No  . Sexual Activity: Not Asked   Other Topics Concern  . None   Social History Narrative   Married (  wife patient of Dr. Yong Channel as well)      Retired from [employer] logistics/support/administrative officer and [place]      Hobbies: Scientist, research (physical sciences) [employer], [place] [employer] [place] .    Objective: BP 142/74 mmHg  Pulse 72  Temp(Src) 97.4 F (36.3 C) (Oral)  Ht 5\' 5"  (1.651 m)  Wt 142 lb (64.411 kg)  BMI 23.63 kg/m2  SpO2 96% Gen: NAD, resting comfortably HEENT: Mucous membranes are moist. Oropharynx normal CV: RRR no murmurs rubs or gallops Lungs: CTAB no crackles, wheeze, rhonchi Abdomen: soft/nontender/nondistended/normal bowel sounds. No rebound or guarding.  Ext: no edema Skin: warm, dry Neuro: grossly normal, moves all extremities, PERRLA  Assessment/Plan:  AWV completed- discussed recommended screenings anddocumented any personalized health advice and referrals for preventive counseling. See AVS as well which was given to patient.   Status of chronic or acute concerns   Alzheimers- remains on aricept but added namenda in combo . Last year's MMSE was 21/30 but repeated in January by neurology and 18/30. Givne history of falls and amount of help wife is having to give- they are concerned patient may have to transition to nursing home in long term. We discussed PACE as potential option  CAD- stable on asa, simvastatin, imdur. Has nitroglycerin and last use was years ago so we discontinued. Stents in 1994, cath in 2012 with nonobstructive CAD  MDS_ follows with Dr. Marin Olp- procrit every 3 weeks.   BPH- stable with cardura and myrbetriq - may be raising BP but important for quality of life. [facility].   HTN- stable on cardura 8mg , imdur 30mg , lisinopril 2.5mg . On higher doses lisiniopril had increased urination and intolerable. We discussed option of amlodipine but concerned about side effects opted out- discussed if over 150 consistently to add at that point.   Hyperlipidemia- update lipids today- previously well controlled on simvastatin 40mg .   GERD- reasonable control on prilosec  Anemia due to b12 (penicious anemia on problem list)- monthly injections in past, taking supplement. Update level today  Pulmonary nodule- planned follow up through pulmonary in may of this year.   Return in about 1 year (around 04/18/2017) for follow up. Would schedule AWV with nurse a week or two before visit with me.. Return precautions advised.   Orders Placed This Encounter  Procedures  . Pneumococcal conjugate vaccine 13-valent  . Comprehensive metabolic panel    [facility]    Order Specific Question:  Has the patient fasted?    Answer:  No  . Lipid panel    [place]    Order Specific Question:  Has the patient fasted?    Answer:  No  . CBC with Differential/Platelet  . Vitamin B12  . Ambulatory referral to Home Health    Referral Priority:  Routine    Referral Type:  Home Health Care    Referral Reason:  Specialty Services Required    Requested Specialty:  [place]    Number of Visits Requested:  1  . POCT Urinalysis Dipstick (Automated)   [name], MD

## 2016-04-18 NOTE — Progress Notes (Signed)
Pre visit review using our clinic review tool, if applicable. No additional management support is needed unless otherwise documented below in the visit note. 

## 2016-04-18 NOTE — Patient Instructions (Addendum)
  Alexander Rice , Thank you for taking time to come for your Medicare Wellness Visit. I appreciate your ongoing commitment to your health goals. Please review the following plan we discussed and let me know if I can assist you in the future.   These are the goals we discussed: 1. Prevnar 13 today 2. Labs before you leave 3. If blood pressure gets above 150 regularly, alert me and we may add the amlodipine 2.5mg  very low dose. We opted today not to make changes due to side effect concerns 4. We will call you within a week about your referral to home health PT and OT. If you do not hear within 2 weeks, give [place] a call.  5. Consider Pace as an option- at least worth getting more information    This is a list of the screening recommended for you and due dates:  Health Maintenance  Topic Date Due  . Shingles Vaccine  09/21/1994  . Pneumonia vaccines (2 of 2 - PCV13) 11/26/2008  . Flu Shot  08/10/2016*  . Tetanus Vaccine  10/07/2018  *Topic was postponed. The date shown is not the original due date.

## 2016-04-19 ENCOUNTER — Encounter: Payer: Self-pay | Admitting: Family Medicine

## 2016-04-20 ENCOUNTER — Telehealth: Payer: Self-pay | Admitting: Family Medicine

## 2016-04-20 DIAGNOSIS — D510 Vitamin B12 deficiency anemia due to intrinsic factor deficiency: Secondary | ICD-10-CM | POA: Diagnosis not present

## 2016-04-20 DIAGNOSIS — F17201 Nicotine dependence, unspecified, in remission: Secondary | ICD-10-CM | POA: Diagnosis not present

## 2016-04-20 DIAGNOSIS — I2510 Atherosclerotic heart disease of native coronary artery without angina pectoris: Secondary | ICD-10-CM | POA: Diagnosis not present

## 2016-04-20 DIAGNOSIS — I10 Essential (primary) hypertension: Secondary | ICD-10-CM | POA: Diagnosis not present

## 2016-04-20 DIAGNOSIS — Z7982 Long term (current) use of aspirin: Secondary | ICD-10-CM | POA: Diagnosis not present

## 2016-04-20 DIAGNOSIS — R296 Repeated falls: Secondary | ICD-10-CM | POA: Diagnosis not present

## 2016-04-20 DIAGNOSIS — M4806 Spinal stenosis, lumbar region: Secondary | ICD-10-CM | POA: Diagnosis not present

## 2016-04-20 DIAGNOSIS — H409 Unspecified glaucoma: Secondary | ICD-10-CM | POA: Diagnosis not present

## 2016-04-20 DIAGNOSIS — F0280 Dementia in other diseases classified elsewhere without behavioral disturbance: Secondary | ICD-10-CM | POA: Diagnosis not present

## 2016-04-20 DIAGNOSIS — R2689 Other abnormalities of gait and mobility: Secondary | ICD-10-CM | POA: Diagnosis not present

## 2016-04-20 DIAGNOSIS — Z9181 History of falling: Secondary | ICD-10-CM | POA: Diagnosis not present

## 2016-04-20 DIAGNOSIS — G309 Alzheimer's disease, unspecified: Secondary | ICD-10-CM | POA: Diagnosis not present

## 2016-04-20 DIAGNOSIS — D469 Myelodysplastic syndrome, unspecified: Secondary | ICD-10-CM | POA: Diagnosis not present

## 2016-04-20 NOTE — Telephone Encounter (Signed)
Monique from [facility] call to ask for verbal orders to start Physical Therapy    [phone]

## 2016-04-20 NOTE — Telephone Encounter (Signed)
I spoke with [name] at [facility].  I gave verbal order to reschedule OT Eval to next week. She voiced understanding with read back of order.

## 2016-04-20 NOTE — Telephone Encounter (Signed)
Yes thanks, that's fine

## 2016-04-20 NOTE — Telephone Encounter (Signed)
[name] is calling requesting verbal order for pt to resch initial OT evaluation to next week

## 2016-04-20 NOTE — Telephone Encounter (Signed)
I spoke with Alexander Rice. She is requesting verbal order for PT 2x/week for 4 weeks for strength, safe transfers, and gait balance training. Please advise.

## 2016-04-20 NOTE — Telephone Encounter (Signed)
Yes thanks, may provide verbal order 

## 2016-04-23 ENCOUNTER — Ambulatory Visit: Payer: Medicare Other | Admitting: Internal Medicine

## 2016-04-23 DIAGNOSIS — D469 Myelodysplastic syndrome, unspecified: Secondary | ICD-10-CM | POA: Diagnosis not present

## 2016-04-23 DIAGNOSIS — R2689 Other abnormalities of gait and mobility: Secondary | ICD-10-CM | POA: Diagnosis not present

## 2016-04-23 DIAGNOSIS — F0280 Dementia in other diseases classified elsewhere without behavioral disturbance: Secondary | ICD-10-CM | POA: Diagnosis not present

## 2016-04-23 DIAGNOSIS — R296 Repeated falls: Secondary | ICD-10-CM | POA: Diagnosis not present

## 2016-04-23 DIAGNOSIS — M4806 Spinal stenosis, lumbar region: Secondary | ICD-10-CM | POA: Diagnosis not present

## 2016-04-23 DIAGNOSIS — G309 Alzheimer's disease, unspecified: Secondary | ICD-10-CM | POA: Diagnosis not present

## 2016-04-23 NOTE — Telephone Encounter (Signed)
Left message for Alexander Rice from [facility] verifying PT. Asked her to please cb and let us know she got the msg.

## 2016-04-24 ENCOUNTER — Telehealth: Payer: Self-pay | Admitting: Family Medicine

## 2016-04-24 NOTE — Telephone Encounter (Signed)
[name] from [facility] call to ask for verbal orders to continue OT 2 times a week for 3 weeks    [name] at [facility]

## 2016-04-24 NOTE — Telephone Encounter (Signed)
Gave verbal okay for OT per Dr. Yong Channel.

## 2016-04-25 DIAGNOSIS — R2689 Other abnormalities of gait and mobility: Secondary | ICD-10-CM | POA: Diagnosis not present

## 2016-04-25 DIAGNOSIS — G309 Alzheimer's disease, unspecified: Secondary | ICD-10-CM | POA: Diagnosis not present

## 2016-04-25 DIAGNOSIS — D469 Myelodysplastic syndrome, unspecified: Secondary | ICD-10-CM | POA: Diagnosis not present

## 2016-04-25 DIAGNOSIS — R296 Repeated falls: Secondary | ICD-10-CM | POA: Diagnosis not present

## 2016-04-25 DIAGNOSIS — F0280 Dementia in other diseases classified elsewhere without behavioral disturbance: Secondary | ICD-10-CM | POA: Diagnosis not present

## 2016-04-25 DIAGNOSIS — M4806 Spinal stenosis, lumbar region: Secondary | ICD-10-CM | POA: Diagnosis not present

## 2016-04-26 ENCOUNTER — Other Ambulatory Visit (HOSPITAL_BASED_OUTPATIENT_CLINIC_OR_DEPARTMENT_OTHER): Payer: Medicare Other

## 2016-04-26 ENCOUNTER — Encounter (HOSPITAL_BASED_OUTPATIENT_CLINIC_OR_DEPARTMENT_OTHER): Payer: Self-pay | Admitting: Emergency Medicine

## 2016-04-26 ENCOUNTER — Telehealth: Payer: Self-pay | Admitting: Family Medicine

## 2016-04-26 ENCOUNTER — Encounter: Payer: Self-pay | Admitting: Family

## 2016-04-26 ENCOUNTER — Observation Stay (HOSPITAL_COMMUNITY): Payer: Medicare Other

## 2016-04-26 ENCOUNTER — Ambulatory Visit: Payer: Medicare Other

## 2016-04-26 ENCOUNTER — Other Ambulatory Visit: Payer: Self-pay

## 2016-04-26 ENCOUNTER — Ambulatory Visit (HOSPITAL_BASED_OUTPATIENT_CLINIC_OR_DEPARTMENT_OTHER): Payer: Medicare Other | Admitting: Family

## 2016-04-26 ENCOUNTER — Observation Stay (HOSPITAL_BASED_OUTPATIENT_CLINIC_OR_DEPARTMENT_OTHER)
Admission: EM | Admit: 2016-04-26 | Discharge: 2016-04-28 | Disposition: A | Discharge status: Home / Self Care | Payer: Medicare Other | Attending: Internal Medicine | Admitting: Internal Medicine

## 2016-04-26 VITALS — BP 93/42 | HR 78 | Temp 97.4°F | Resp 16 | Ht 65.0 in | Wt 145.0 lb

## 2016-04-26 DIAGNOSIS — I080 Rheumatic disorders of both mitral and aortic valves: Secondary | ICD-10-CM | POA: Diagnosis not present

## 2016-04-26 DIAGNOSIS — R159 Full incontinence of feces: Secondary | ICD-10-CM | POA: Insufficient documentation

## 2016-04-26 DIAGNOSIS — G309 Alzheimer's disease, unspecified: Secondary | ICD-10-CM | POA: Diagnosis not present

## 2016-04-26 DIAGNOSIS — I10 Essential (primary) hypertension: Secondary | ICD-10-CM | POA: Diagnosis present

## 2016-04-26 DIAGNOSIS — D518 Other vitamin B12 deficiency anemias: Secondary | ICD-10-CM

## 2016-04-26 DIAGNOSIS — K219 Gastro-esophageal reflux disease without esophagitis: Secondary | ICD-10-CM | POA: Insufficient documentation

## 2016-04-26 DIAGNOSIS — I517 Cardiomegaly: Secondary | ICD-10-CM | POA: Insufficient documentation

## 2016-04-26 DIAGNOSIS — N189 Chronic kidney disease, unspecified: Secondary | ICD-10-CM | POA: Diagnosis not present

## 2016-04-26 DIAGNOSIS — R4189 Other symptoms and signs involving cognitive functions and awareness: Secondary | ICD-10-CM | POA: Diagnosis present

## 2016-04-26 DIAGNOSIS — N400 Enlarged prostate without lower urinary tract symptoms: Secondary | ICD-10-CM | POA: Insufficient documentation

## 2016-04-26 DIAGNOSIS — F0280 Dementia in other diseases classified elsewhere without behavioral disturbance: Secondary | ICD-10-CM | POA: Diagnosis not present

## 2016-04-26 DIAGNOSIS — R404 Transient alteration of awareness: Secondary | ICD-10-CM | POA: Diagnosis not present

## 2016-04-26 DIAGNOSIS — R001 Bradycardia, unspecified: Secondary | ICD-10-CM | POA: Diagnosis not present

## 2016-04-26 DIAGNOSIS — Z7982 Long term (current) use of aspirin: Secondary | ICD-10-CM | POA: Insufficient documentation

## 2016-04-26 DIAGNOSIS — Q2546 Tortuous aortic arch: Secondary | ICD-10-CM | POA: Insufficient documentation

## 2016-04-26 DIAGNOSIS — D469 Myelodysplastic syndrome, unspecified: Secondary | ICD-10-CM | POA: Diagnosis present

## 2016-04-26 DIAGNOSIS — F03C0 Unspecified dementia, severe, without behavioral disturbance, psychotic disturbance, mood disturbance, and anxiety: Secondary | ICD-10-CM | POA: Diagnosis present

## 2016-04-26 DIAGNOSIS — I495 Sick sinus syndrome: Secondary | ICD-10-CM

## 2016-04-26 DIAGNOSIS — E538 Deficiency of other specified B group vitamins: Secondary | ICD-10-CM | POA: Insufficient documentation

## 2016-04-26 DIAGNOSIS — J9811 Atelectasis: Secondary | ICD-10-CM | POA: Insufficient documentation

## 2016-04-26 DIAGNOSIS — Z955 Presence of coronary angioplasty implant and graft: Secondary | ICD-10-CM | POA: Insufficient documentation

## 2016-04-26 DIAGNOSIS — D461 Refractory anemia with ring sideroblasts: Secondary | ICD-10-CM | POA: Diagnosis not present

## 2016-04-26 DIAGNOSIS — D510 Vitamin B12 deficiency anemia due to intrinsic factor deficiency: Secondary | ICD-10-CM

## 2016-04-26 DIAGNOSIS — D460 Refractory anemia without ring sideroblasts, so stated: Secondary | ICD-10-CM | POA: Diagnosis not present

## 2016-04-26 DIAGNOSIS — E785 Hyperlipidemia, unspecified: Secondary | ICD-10-CM | POA: Insufficient documentation

## 2016-04-26 DIAGNOSIS — I129 Hypertensive chronic kidney disease with stage 1 through stage 4 chronic kidney disease, or unspecified chronic kidney disease: Secondary | ICD-10-CM | POA: Diagnosis not present

## 2016-04-26 DIAGNOSIS — H409 Unspecified glaucoma: Secondary | ICD-10-CM | POA: Insufficient documentation

## 2016-04-26 DIAGNOSIS — I2510 Atherosclerotic heart disease of native coronary artery without angina pectoris: Secondary | ICD-10-CM | POA: Diagnosis not present

## 2016-04-26 DIAGNOSIS — R55 Syncope and collapse: Principal | ICD-10-CM | POA: Diagnosis present

## 2016-04-26 DIAGNOSIS — Z86010 Personal history of colonic polyps: Secondary | ICD-10-CM | POA: Diagnosis not present

## 2016-04-26 DIAGNOSIS — D631 Anemia in chronic kidney disease: Secondary | ICD-10-CM

## 2016-04-26 DIAGNOSIS — D649 Anemia, unspecified: Secondary | ICD-10-CM

## 2016-04-26 DIAGNOSIS — Z87891 Personal history of nicotine dependence: Secondary | ICD-10-CM | POA: Insufficient documentation

## 2016-04-26 DIAGNOSIS — F0390 Unspecified dementia without behavioral disturbance: Secondary | ICD-10-CM | POA: Diagnosis present

## 2016-04-26 LAB — CBC WITH DIFFERENTIAL (CANCER CENTER ONLY)
BASO#: 0 10*3/uL (ref 0.0–0.2)
BASO%: 0.2 % (ref 0.0–2.0)
EOS%: 2.3 % (ref 0.0–7.0)
Eosinophils Absolute: 0.1 10*3/uL (ref 0.0–0.5)
HEMATOCRIT: 24 % — AB (ref 38.7–49.9)
HEMOGLOBIN: 8.1 g/dL — AB (ref 13.0–17.1)
LYMPH#: 1.4 10*3/uL (ref 0.9–3.3)
LYMPH%: 26.8 % (ref 14.0–48.0)
MCH: 34.5 pg — ABNORMAL HIGH (ref 28.0–33.4)
MCHC: 33.8 g/dL (ref 32.0–35.9)
MCV: 102 fL — AB (ref 82–98)
MONO#: 0.4 10*3/uL (ref 0.1–0.9)
MONO%: 7.5 % (ref 0.0–13.0)
NEUT%: 63.2 % (ref 40.0–80.0)
NEUTROS ABS: 3.4 10*3/uL (ref 1.5–6.5)
Platelets: 445 10*3/uL — ABNORMAL HIGH (ref 145–400)
RBC: 2.35 10*6/uL — AB (ref 4.20–5.70)
RDW: 20.6 % — ABNORMAL HIGH (ref 11.1–15.7)
WBC: 5.3 10*3/uL (ref 4.0–10.0)

## 2016-04-26 LAB — CMP (CANCER CENTER ONLY)
ALBUMIN: 3.2 g/dL — AB (ref 3.3–5.5)
ALK PHOS: 40 U/L (ref 26–84)
ALT: 21 U/L (ref 10–47)
AST: 21 U/L (ref 11–38)
BILIRUBIN TOTAL: 0.9 mg/dL (ref 0.20–1.60)
BUN, Bld: 19 mg/dL (ref 7–22)
CALCIUM: 8.7 mg/dL (ref 8.0–10.3)
CO2: 26 mEq/L (ref 18–33)
Chloride: 100 mEq/L (ref 98–108)
Creat: 1.1 mg/dl (ref 0.6–1.2)
Glucose, Bld: 108 mg/dL (ref 73–118)
POTASSIUM: 4.3 meq/L (ref 3.3–4.7)
Sodium: 135 mEq/L (ref 128–145)
TOTAL PROTEIN: 5.4 g/dL — AB (ref 6.4–8.1)

## 2016-04-26 LAB — CBC
HEMATOCRIT: 25.1 % — AB (ref 39.0–52.0)
HEMOGLOBIN: 8.5 g/dL — AB (ref 13.0–17.0)
MCH: 33.9 pg (ref 26.0–34.0)
MCHC: 33.9 g/dL (ref 30.0–36.0)
MCV: 100 fL (ref 78.0–100.0)
Platelets: 455 10*3/uL — ABNORMAL HIGH (ref 150–400)
RBC: 2.51 MIL/uL — AB (ref 4.22–5.81)
RDW: 20.4 % — ABNORMAL HIGH (ref 11.5–15.5)
WBC: 5.5 10*3/uL (ref 4.0–10.5)

## 2016-04-26 LAB — BASIC METABOLIC PANEL
ANION GAP: 7 (ref 5–15)
BUN: 23 mg/dL — ABNORMAL HIGH (ref 6–20)
CALCIUM: 8.4 mg/dL — AB (ref 8.9–10.3)
CO2: 23 mmol/L (ref 22–32)
Chloride: 102 mmol/L (ref 101–111)
Creatinine, Ser: 1.02 mg/dL (ref 0.61–1.24)
GLUCOSE: 109 mg/dL — AB (ref 65–99)
POTASSIUM: 4.1 mmol/L (ref 3.5–5.1)
SODIUM: 132 mmol/L — AB (ref 135–145)

## 2016-04-26 LAB — CBG MONITORING, ED: GLUCOSE-CAPILLARY: 107 mg/dL — AB (ref 65–99)

## 2016-04-26 LAB — TROPONIN I

## 2016-04-26 MED ORDER — PANTOPRAZOLE SODIUM 40 MG PO TBEC
40.0000 mg | DELAYED_RELEASE_TABLET | Freq: Every day | ORAL | Status: DC
  Administered 2016-04-27 – 2016-04-28 (×2): 40 mg via ORAL
  Filled 2016-04-26 (×2): qty 1

## 2016-04-26 MED ORDER — LATANOPROST 0.005 % OP SOLN
1.0000 [drp] | Freq: Every day | OPHTHALMIC | Status: DC
  Administered 2016-04-26 – 2016-04-27 (×2): 1 [drp] via OPHTHALMIC
  Filled 2016-04-26: qty 2.5

## 2016-04-26 MED ORDER — ATROPINE SULFATE 1 MG/10ML IJ SOSY: 0.5000 mg | PREFILLED_SYRINGE | INTRAMUSCULAR | Status: DC | PRN

## 2016-04-26 MED ORDER — ALBUTEROL SULFATE (2.5 MG/3ML) 0.083% IN NEBU: 2.5000 mg | INHALATION_SOLUTION | RESPIRATORY_TRACT | Status: DC | PRN

## 2016-04-26 MED ORDER — DORZOLAMIDE HCL 2 % OP SOLN
1.0000 [drp] | Freq: Two times a day (BID) | OPHTHALMIC | Status: DC
  Administered 2016-04-27 – 2016-04-28 (×3): 1 [drp] via OPHTHALMIC
  Filled 2016-04-26: qty 10

## 2016-04-26 MED ORDER — ISOSORBIDE MONONITRATE ER 30 MG PO TB24
30.0000 mg | ORAL_TABLET | Freq: Every day | ORAL | Status: DC
  Administered 2016-04-27: 30 mg via ORAL
  Filled 2016-04-26: qty 1

## 2016-04-26 MED ORDER — DOXAZOSIN MESYLATE 8 MG PO TABS: 8.0000 mg | ORAL_TABLET | Freq: Every day | ORAL | Status: DC

## 2016-04-26 MED ORDER — RISAQUAD PO CAPS
1.0000 | ORAL_CAPSULE | Freq: Every day | ORAL | Status: DC
  Administered 2016-04-27 – 2016-04-28 (×2): 1 via ORAL
  Filled 2016-04-26 (×2): qty 1

## 2016-04-26 MED ORDER — VITAMIN B-12 100 MCG PO TABS
100.0000 ug | ORAL_TABLET | Freq: Every day | ORAL | Status: DC
  Administered 2016-04-27 – 2016-04-28 (×2): 100 ug via ORAL
  Filled 2016-04-26 (×2): qty 1

## 2016-04-26 MED ORDER — EPOETIN ALFA 40000 UNIT/ML IJ SOLN
INTRAMUSCULAR | Status: AC
  Filled 2016-04-26: qty 1

## 2016-04-26 MED ORDER — ONDANSETRON HCL 4 MG PO TABS: 4.0000 mg | ORAL_TABLET | Freq: Four times a day (QID) | ORAL | Status: DC | PRN

## 2016-04-26 MED ORDER — CVS OMEGA-3 KRILL OIL 300 MG PO CAPS: 350.0000 mg | ORAL_CAPSULE | Freq: Every day | ORAL | Status: DC

## 2016-04-26 MED ORDER — ONE-DAILY MULTI VITAMINS PO TABS: 1.0000 | ORAL_TABLET | Freq: Every day | ORAL | Status: DC

## 2016-04-26 MED ORDER — SIMVASTATIN 40 MG PO TABS
40.0000 mg | ORAL_TABLET | Freq: Every day | ORAL | Status: DC
  Administered 2016-04-26 – 2016-04-27 (×2): 40 mg via ORAL
  Filled 2016-04-26 (×2): qty 1

## 2016-04-26 MED ORDER — VITAMIN B-6 50 MG PO TABS
250.0000 mg | ORAL_TABLET | Freq: Every day | ORAL | Status: DC
  Administered 2016-04-27 – 2016-04-28 (×2): 250 mg via ORAL
  Filled 2016-04-26 (×2): qty 1

## 2016-04-26 MED ORDER — ASPIRIN EC 81 MG PO TBEC
81.0000 mg | DELAYED_RELEASE_TABLET | Freq: Every day | ORAL | Status: DC
Start: 2016-04-27 — End: 2016-04-28
  Administered 2016-04-27 – 2016-04-28 (×2): 81 mg via ORAL
  Filled 2016-04-26 (×2): qty 1

## 2016-04-26 MED ORDER — LISINOPRIL 2.5 MG PO TABS
2.5000 mg | ORAL_TABLET | Freq: Every day | ORAL | Status: DC
  Administered 2016-04-27: 2.5 mg via ORAL
  Filled 2016-04-26: qty 1

## 2016-04-26 MED ORDER — ONDANSETRON HCL 4 MG/2ML IJ SOLN: 4.0000 mg | Freq: Four times a day (QID) | INTRAMUSCULAR | Status: DC | PRN

## 2016-04-26 MED ORDER — ADULT MULTIVITAMIN W/MINERALS CH
1.0000 | ORAL_TABLET | Freq: Every day | ORAL | Status: DC
  Administered 2016-04-27 – 2016-04-28 (×2): 1 via ORAL
  Filled 2016-04-26 (×3): qty 1

## 2016-04-26 MED ORDER — ENOXAPARIN SODIUM 40 MG/0.4ML ~~LOC~~ SOLN
40.0000 mg | SUBCUTANEOUS | Status: DC
  Administered 2016-04-26 – 2016-04-27 (×2): 40 mg via SUBCUTANEOUS
  Filled 2016-04-26 (×2): qty 0.4

## 2016-04-26 MED ORDER — CVS PROBIOTIC PO CHEW: 1.0000 | CHEWABLE_TABLET | Freq: Every day | ORAL | Status: DC

## 2016-04-26 MED ORDER — EPOETIN ALFA 40000 UNIT/ML IJ SOLN: 40000.0000 [IU] | Freq: Once | INTRAMUSCULAR | Status: DC

## 2016-04-26 NOTE — ED Notes (Addendum)
Pt presents from [facility] center upstairs. Pt was seen for his monthly procrit shot. Staff states pt became diaphoretic, lethargic and heart rate dropped into the 20's. Pt pale, alert and oriented at this time. Pt was incontinent of stool.

## 2016-04-26 NOTE — Progress Notes (Signed)
Hematology and Oncology Follow Up Visit  Alexander Rice [id number] 1932/03/06 82 y.o. 04/26/2016   Principle Diagnosis:  Anemia of renal insufficiency  Refractory anemia with ringed sideroblasts. Pernicious anemia  Current Therapy:   Procrit 40,000 units subcutaneous monthly Vitamin B6 250 mg by mouth daily    Interim History: Alexander Rice is here today with is wife and daughter for a follow-up. He stated that he was doing well and had no complaints. While talking with the patient he suddenly became lethargic. No verbal response. Pupils were intact. His pulse dropped from 62 to 21 and he became pale and diaphoretic. The rapid response team had been notified and came to help assist the patient to the ED. Before leaving for the emergency room his HR was back up in the 60s. The patient was incontinent of urine and stool during the episode. His family states that he has had a few episodes where he became lethargic at home but these resolved without intervention.   Before his episode we had been able to discuss his status.  No fever, chills, n/v, cough, rash, headache, dizziness, vision changes, SOB, chest pain, palpitations, abdominal pain or changes in bowel or bladder habits.  No swelling, tenderness, numbness or tingling in his extremities. No c/o joint aches or bone pain.  His Hgb is 8.1 today. His platelet count is 445. He has had no episodes of bleeding.  No lymphadenopathy found on exam. No falls or syncopal episodes.   He has maintained a good appetite and is staying hydrated. His weight is down 2 lbs since his last visit.   Medications:    Medication List       This list is accurate as of: 04/26/16  1:20 PM.  Always use your most recent med list.               aspirin 81 MG tablet  Take 81 mg by mouth daily.     CVS OMEGA-3 KRILL OIL PO  Take 350 mg by mouth daily. Reported on 01/16/2016     CVS PROBIOTIC Chew  Chew by mouth daily.     dorzolamide 2 % ophthalmic  solution  Commonly known as:  TRUSOPT  Place 1 drop into both eyes 2 (two) times daily.     doxazosin 8 MG tablet  Commonly known as:  CARDURA  Take 1 tablet (8 mg total) by mouth at bedtime.     isosorbide mononitrate 30 MG 24 hr tablet  Commonly known as:  IMDUR  Take 1 tablet by mouth  daily     lisinopril 2.5 MG tablet  Commonly known as:  PRINIVIL,ZESTRIL  Take 1 tablet (2.5 mg total) by mouth daily.     LUMIGAN 0.01 % Soln  Generic drug:  bimatoprost  Place 1 drop into both eyes at bedtime.     Memantine HCl-Donepezil HCl 28-10 MG Cp24  Take 1 capsule by mouth daily.     multivitamin tablet  Take 1 tablet by mouth daily.     MYRBETRIQ 50 MG Tb24 tablet  Generic drug:  mirabegron ER  Take 50 mg by mouth daily.     omeprazole 20 MG capsule  Commonly known as:  PRILOSEC     simvastatin 40 MG tablet  Commonly known as:  ZOCOR  TAKE 1 TABLET BY MOUTH  DAILY AT 6 PM     vitamin B-6 250 MG tablet  Take 1 tablet (250 mg total) by mouth daily.  Allergies: No Known Allergies  Past Medical History, Surgical history, Social history, and Family History were reviewed and updated.  Review of Systems: All other 10 point review of systems is negative.   Physical Exam:  vitals were not taken for this visit.  Wt Readings from Last 3 Encounters:  04/18/16 142 lb (64.411 kg)  04/05/16 144 lb (65.318 kg)  03/15/16 151 lb (68.493 kg)    Ocular: Sclerae unicteric, pupils equal, round and reactive to light Ear-nose-throat: Oropharynx clear, dentition fair Lymphatic: No cervical supraclavicular or axillary adenopathy Lungs no rales or rhonchi, good excursion bilaterally Heart regular rate and rhythm, no murmur appreciated Abd soft, nontender, positive bowel sounds, no liver or spleen tip palpated on exam, no fluid wave  MSK no focal spinal tenderness, no joint edema Neuro: non-focal, well-oriented, appropriate affect Breast: Deferred   Lab Results  Component  Value Date   WBC 4.2 04/18/2016   HGB 8.6 Repeated and verified X2.* 04/18/2016   HCT 25.9 Repeated and verified X2.* 04/18/2016   MCV 100.3* 04/18/2016   PLT 476.0* 04/18/2016   Lab Results  Component Value Date   FERRITIN 1,045* 04/05/2016   IRON 104 04/05/2016   TIBC 190* 04/05/2016   UIBC 86* 04/05/2016   IRONPCTSAT 55 04/05/2016   Lab Results  Component Value Date   RETICCTPCT 1.0 11/25/2015   RBC 2.58* 04/18/2016   RETICCTABS 27.2 11/25/2015   Lab Results  Component Value Date   KPAFRELGTCHN 3.09* 04/22/2008   LAMBDASER 1.01 04/22/2008   KAPLAMBRATIO 3.06* 04/22/2008   No results found for: Kandis Cocking, IGMSERUM Lab Results  Component Value Date   TOTALPROTELP 6.3 04/22/2008   ALBUMINELP 66.3* 04/22/2008   A1GS 3.9 04/22/2008   A2GS 8.1 04/22/2008   BETS 5.2 04/22/2008   BETA2SER 4.0 04/22/2008   GAMS 12.5 04/22/2008   MSPIKE NOT DET 04/22/2008   SPEI * 04/22/2008     Chemistry      Component Value Date/Time   NA 134* 04/18/2016 0951   NA 131* 04/05/2016 1404   NA 133 03/15/2016 1326   K 4.8 04/18/2016 0951   K 4.2 04/05/2016 1404   K 4.2 03/15/2016 1326   CL 104 04/18/2016 0951   CL 101 03/15/2016 1326   CO2 26 04/18/2016 0951   CO2 25 04/05/2016 1404   CO2 25 03/15/2016 1326   BUN 25* 04/18/2016 0951   BUN 18.4 04/05/2016 1404   BUN 20 03/15/2016 1326   CREATININE 0.97 04/18/2016 0951   CREATININE 1.0 04/05/2016 1404   CREATININE 1.2 03/15/2016 1326      Component Value Date/Time   CALCIUM 8.9 04/18/2016 0951   CALCIUM 8.5 04/05/2016 1404   CALCIUM 8.3 03/15/2016 1326   ALKPHOS 42 04/18/2016 0951   ALKPHOS 55 04/05/2016 1404   ALKPHOS 45 03/15/2016 1326   AST 16 04/18/2016 0951   AST 16 04/05/2016 1404   AST 21 03/15/2016 1326   ALT 14 04/18/2016 0951   ALT 15 04/05/2016 1404   ALT 19 03/15/2016 1326   BILITOT 1.3* 04/18/2016 0951   BILITOT 0.63 04/05/2016 1404   BILITOT 1.00 03/15/2016 1326     Impression and Plan: Alexander Rice  is an 82 yo white male with myelodysplasia and refractory anemia with ringed sideroblasts. He was here today for a routine follow-up and injection but during his assessment he became lethargic, pale, diaphoretic and bradycardic with a HR in the 20's. The rapid response team arrived and assisted the patient and his  family down stairs to the ED. Before leaving he had become more alert and had a HR in the 60's.  He did not receive Procrit today.  Dr. Marin Olp will follow-up with him in the hospital tomorrow morning.   Eliezer Bottom, NP 6/8/20171:20 PM

## 2016-04-26 NOTE — Telephone Encounter (Signed)
Alexander Rice home health would like yo to know pt 's daughter has cancelled all appointments for OT this week, and visits   will begin next week. [name] would like to know if they can add those missed visits to the end of plan of care?

## 2016-04-26 NOTE — Progress Notes (Signed)
Should being transferred from [facility] to [facility]. Patient evaluated at bedside [facility] for syncopal episode while sitting in a chair. At that time patient was also incontinent of stool and was noted at the heart rate in the 20s. Per family report patient has had other milder episodes over the last several weeks Dr. Claiborne Billings of cardiology was consult by Dr. Tanna Furry, EDP of metastases in [facility], who recommends transfer to [facility] for further evaluation and possible pacemaker placement. Patient accepted to a telemetry bed.  Linna Darner, MD [facility] 04/26/2016, 4:22 PM

## 2016-04-26 NOTE — Progress Notes (Signed)
Patient was to get procrit injection. While in the office with the NP he had a emergent event requiring immediate transfer to the ED. Procrit not given.

## 2016-04-26 NOTE — ED Notes (Signed)
MD at bedside. 

## 2016-04-26 NOTE — ED Provider Notes (Signed)
CSN: [id number]     Arrival date & time 04/26/16  1411 History   First MD Initiated Contact with Patient 04/26/16 1504     Chief Complaint  Patient presents with  . Near Syncope      HPI  Patient presents for evaluation after a syncopal episode with loss of consciousness and fecal incontinence.  Patient has a history of mild dementia primarily diagnosed over the last 1 year.  He continues to function at home. He has a myelodysplastic syndrome. He follows with Dr. Charlesetta Garibaldi.  His baseline hemoglobin has been around 8-9. He is receiving Procrit once per month and Dr. Guadalupe Maple office.  He was here today for his routine Procrit injection. He did not receive it. While sitting and waiting this, he had an unprovoked syncopal episode.  Daughter states that his eyes seemed to just be fixed and then "like he wasn't there". He does not have myoclonic activity or seizure disorder. He was incontinent of stool in his head slumped forward. He did not become diaphoretic. He did not complain of pain before after. He did not complain of nausea before after. This resolved within a few minutes spontaneously.  The staff at the hematology office immediately cared for him. He is placed on a pulse oximetry monitor. His blood pressure was taken and was 60. His heart rate was 21. No strips were available. This was a pulse ox, not a cardiac monitor. This resolved spontaneously.  Past Medical History  Diagnosis Date  . CAD (coronary artery disease)   . GERD (gastroesophageal reflux disease)   . Hyperlipidemia   . Hypertension   . BPH (benign prostatic hyperplasia)   . CVD (cardiovascular disease)   . Adenomatous colon polyp 02/1996, 02/2011    TA polyp 02/2011  . B12 deficiency   . Anemia in chronic renal disease 11/02/2011  . Esophageal stricture   . Diverticulosis   . Hemorrhoids   . MDS (myelodysplastic syndrome) ([place])   . Glaucoma   . Heart palpitations   . Hiatal hernia   . History of colonic polyps  09/29/2006    Polyps age 36, Dr. Fuller Plan stated no further colonoscopy due to age. Confirmed with daughter given alzheimers, CAD history     Past Surgical History  Procedure Laterality Date  . Carotid endarterectomy    . Ptca      stent  . Quadriceps repair      muscle attachment  . Colonoscopy    . Inguinal hernia repair      right side/ twice  . Tonsillectomy    . Glaucoma surgery Bilateral 11/19/12    pt's report   Family History  Problem Relation Age of Onset  . COPD Mother   . Alcohol abuse Father    Social History  Substance Use Topics  . Smoking status: Former Smoker    Quit date: 11/19/1968  . Smokeless tobacco: Never Used     Comment: never used tobacco  . Alcohol Use: 4.2 oz/week    7 Standard drinks or equivalent per week     Comment: Wine/Beer    Review of Systems  Neurological: Positive for syncope.      Allergies  Review of patient's allergies indicates no known allergies.  Home Medications   Prior to Admission medications   Medication Sig Start Date End Date Taking? Authorizing Provider  aspirin 81 MG tablet Take 81 mg by mouth daily.      Historical Provider, MD  [employer] OMEGA-3 KRILL OIL  PO Take 350 mg by mouth daily. Reported on 01/16/2016    Historical Provider, MD  dorzolamide (TRUSOPT) 2 % ophthalmic solution Place 1 drop into both eyes 2 (two) times daily.      Historical Provider, MD  doxazosin (CARDURA) 8 MG tablet Take 1 tablet (8 mg total) by mouth at bedtime. 02/03/16   Marin Olp, MD  isosorbide mononitrate (IMDUR) 30 MG 24 hr tablet Take 1 tablet by mouth  daily 11/29/15   Marin Olp, MD  lisinopril (PRINIVIL,ZESTRIL) 2.5 MG tablet Take 1 tablet (2.5 mg total) by mouth daily. 02/20/16   Marin Olp, MD  LUMIGAN 0.01 % SOLN Place 1 drop into both eyes at bedtime.  03/23/11   Historical Provider, MD  Memantine HCl-Donepezil HCl 28-10 MG CP24 Take 1 capsule by mouth daily.    Historical Provider, MD  mirabegron ER (MYRBETRIQ) 50 MG  TB24 tablet Take 50 mg by mouth daily.    Historical Provider, MD  Multiple Vitamin (MULTIVITAMIN) tablet Take 1 tablet by mouth daily.      Historical Provider, MD  omeprazole (PRILOSEC) 20 MG capsule  06/28/15   Historical Provider, MD  Probiotic Product ([employer] PROBIOTIC) CHEW Chew by mouth daily.    Historical Provider, MD  Pyridoxine HCl (VITAMIN B-6) 250 MG tablet Take 1 tablet (250 mg total) by mouth daily. 05/17/15   [name], MD  simvastatin (ZOCOR) 40 MG tablet TAKE 1 TABLET BY MOUTH  DAILY AT 6 PM 01/16/16   Marin Olp, MD   BP 118/47 mmHg  Pulse 52  Temp(Src) 97.9 F (36.6 C) (Oral)  Resp 16  Wt 145 lb (65.772 kg)  SpO2 96% Physical Exam  Constitutional: He is oriented to person, place, and time. He appears well-developed and well-nourished. No distress.  HENT:  Head: Normocephalic.  Eyes: Conjunctivae are normal. Pupils are equal, round, and reactive to light. No scleral icterus.  Conjunctivae are pale  Neck: Normal range of motion. Neck supple. No thyromegaly present.  Cardiovascular: Normal rate and regular rhythm.  Exam reveals no gallop and no friction rub.   No murmur heard. Sinus bradycardia rate of 53 on the monitor.  Pulmonary/Chest: Effort normal and breath sounds normal. No respiratory distress. He has no wheezes. He has no rales.  Abdominal: Soft. Bowel sounds are normal. He exhibits no distension. There is no tenderness. There is no rebound.  Musculoskeletal: Normal range of motion.  Neurological: He is alert and oriented to person, place, and time.  Skin: Skin is warm and dry. No rash noted.  Psychiatric: He has a normal mood and affect. His behavior is normal.    ED Course  Procedures (including critical care time) Labs Review Labs Reviewed  BASIC METABOLIC PANEL - Abnormal; Notable for the following:    Sodium 132 (*)    Glucose, Bld 109 (*)    BUN 23 (*)    Calcium 8.4 (*)    All other components within normal limits  CBC - Abnormal;  Notable for the following:    RBC 2.51 (*)    Hemoglobin 8.5 (*)    HCT 25.1 (*)    RDW 20.4 (*)    Platelets 455 (*)    All other components within normal limits  CBG MONITORING, ED - Abnormal; Notable for the following:    Glucose-Capillary 107 (*)    All other components within normal limits  TROPONIN I  URINALYSIS, ROUTINE W REFLEX MICROSCOPIC (NOT AT [facility])  Imaging Review No results found. I have personally reviewed and evaluated these images and lab results as part of my medical decision-making.   EKG Interpretation   Date/Time:  Thursday April 26 2016 14:28:02 EDT Ventricular Rate:  58 PR Interval:  123 QRS Duration: 99 QT Interval:  460 QTC Calculation: 452 R Axis:   -176 Text Interpretation:  Sinus or ectopic atrial rhythm Right axis deviation  Abnormal T, consider ischemia, diffuse leads Confirmed by Jeneen Rinks  MD, [place]  [phone]) on 04/26/2016 3:18:40 PM      MDM   Final diagnoses:  Near syncope  Sick sinus syndrome ([place])   I discussed the case with Dr. Payton Emerald of [facility]. Also with Dr. Claiborne Billings of [facility] cardiology. Prior to this my discussion with the family involves there wants and desires further interventions for him. The patient, his wife, daughter all feel that he he would want to have a pacemaker placed should cardiology feel this is appropriate and indicated. Dr. Claiborne Billings felt that the admission for discussion for this was appropriate. Discussed the patient with Dr. Barbaraann Faster. Patient accepted in transfer to telemetry bed.    Tanna Furry, MD 04/26/16 [phone]

## 2016-04-26 NOTE — H&P (Addendum)
History and Physical    Alexander Rice [id number] DOB: Jul 28, 1932 DOA: 04/26/2016  Referring MD/NP/PA:  Tanna Furry PCP: [name], MD  Patient coming from: Oncology office to [facility] [place]  Chief Complaint: Unresponsiveness  HPI: Alexander Rice is a 82 y.o. male with medical history significant of CAD, HTN, HLD, BPH, anemia, renal insufficiency MDS followed by Dr. Marin Olp; who presents after becoming unresponsive while at his regularly scheduled oncology follow-up visit. History is obtained with the assistance of the patient's daughter and per review of notes as the patient has dementia. He had just stated that he is doing well  and thereafter suddenly went unresponsive. Patient was noted to become diaphoretic, pale, and had pulse rates that dropped from around 62 down to 21. Daughter states that he never fully lost consciousness, but was zoned out. The episode lasted approximately an hour from 5-10 minutes.  Associated symptoms include incontinence of bowel.there is no note of any specific seizure-like activity.  He had been scheduled to receive his Procrit injection today, but unable to do so. Family notes that patient has had a few episodes like this previously where he very becomes lethargic at home, but they resolved without intervention. Overall patient had a good appetite. Family denies any recent fever, chills, chest pain, shortness of breath, diarrhea, or recent sick contacts.  ED Course: Patient is febrile and patient was seen to be afebrile, pulse from 51-78, blood pressure as low as 93/42, O2 saturations were maintained on room air. Laboratory revealed WBC of 5.5, hemoglobin 8.5, platelets 455, sodium 132, serum 4.1, BUN 23, creatinine 1.02. Patient intubated on the IV fluids in the ED with improvement of blood pressures. Dr. Claiborne Billings of cardiology was called on the ED physician who recommended patient be transferred to telemetry bed at [facility]. [facility] called to  admit.   Review of Systems: As per HPI otherwise 10 point review of systems negative.   Past Medical History  Diagnosis Date  . CAD (coronary artery disease)   . GERD (gastroesophageal reflux disease)   . Hyperlipidemia   . Hypertension   . BPH (benign prostatic hyperplasia)   . CVD (cardiovascular disease)   . Adenomatous colon polyp 02/1996, 02/2011    TA polyp 02/2011  . B12 deficiency   . Anemia in chronic renal disease 11/02/2011  . Esophageal stricture   . Diverticulosis   . Hemorrhoids   . MDS (myelodysplastic syndrome) ([place])   . Glaucoma   . Heart palpitations   . Hiatal hernia   . History of colonic polyps 09/29/2006    Polyps age 39, Dr. Fuller Plan stated no further colonoscopy due to age. Confirmed with daughter given alzheimers, CAD history      Past Surgical History  Procedure Laterality Date  . Carotid endarterectomy    . Ptca      stent  . Quadriceps repair      muscle attachment  . Colonoscopy    . Inguinal hernia repair      right side/ twice  . Tonsillectomy    . Glaucoma surgery Bilateral 11/19/12    pt's report     reports that he quit smoking about 47 years ago. He has never used smokeless tobacco. He reports that he drinks about 4.2 oz of alcohol per week. He reports that he does not use illicit drugs.  No Known Allergies  Family History  Problem Relation Age of Onset  . COPD Mother   . Alcohol abuse Father  Prior to Admission medications   Medication Sig Start Date End Date Taking? Authorizing Provider  aspirin 81 MG tablet Take 81 mg by mouth daily.      Historical Provider, MD  [employer] OMEGA-3 KRILL OIL PO Take 350 mg by mouth daily. Reported on 01/16/2016    Historical Provider, MD  dorzolamide (TRUSOPT) 2 % ophthalmic solution Place 1 drop into both eyes 2 (two) times daily.      Historical Provider, MD  doxazosin (CARDURA) 8 MG tablet Take 1 tablet (8 mg total) by mouth at bedtime. 02/03/16   Marin Olp, MD  isosorbide mononitrate  (IMDUR) 30 MG 24 hr tablet Take 1 tablet by mouth  daily 11/29/15   Marin Olp, MD  lisinopril (PRINIVIL,ZESTRIL) 2.5 MG tablet Take 1 tablet (2.5 mg total) by mouth daily. 02/20/16   Marin Olp, MD  LUMIGAN 0.01 % SOLN Place 1 drop into both eyes at bedtime.  03/23/11   Historical Provider, MD  Memantine HCl-Donepezil HCl 28-10 MG CP24 Take 1 capsule by mouth daily.    Historical Provider, MD  mirabegron ER (MYRBETRIQ) 50 MG TB24 tablet Take 50 mg by mouth daily.    Historical Provider, MD  Multiple Vitamin (MULTIVITAMIN) tablet Take 1 tablet by mouth daily.      Historical Provider, MD  omeprazole (PRILOSEC) 20 MG capsule  06/28/15   Historical Provider, MD  Probiotic Product ([employer]S PROBIOTIC) CHEW Chew by mouth daily.    Historical Provider, MD  Pyridoxine HCl (VITAMIN B-6) 250 MG tablet Take 1 tablet (250 mg total) by mouth daily. 05/17/15   [name], MD  simvastatin (ZOCOR) 40 MG tablet TAKE 1 TABLET BY MOUTH  DAILY AT 6 PM 01/16/16   Marin Olp, MD    Physical Exam: Filed Vitals:   04/26/16 1815 04/26/16 1900 04/26/16 1915 04/26/16 1930  BP: 118/47 160/53 143/49 143/51  Pulse: 51 53 53 55  Temp:      TempSrc:      Resp: 16 12 15 15   Weight:      SpO2: 99% 100% 99% 100%      Constitutional: Elderly gentleman in NAD, calm, comfortable Filed Vitals:   04/26/16 1815 04/26/16 1900 04/26/16 1915 04/26/16 1930  BP: 118/47 160/53 143/49 143/51  Pulse: 51 53 53 55  Temp:      TempSrc:      Resp: 16 12 15 15   Weight:      SpO2: 99% 100% 99% 100%   Eyes: PERRL, lids and conjunctivae normal ENMT: Mucous membranes are moist. Posterior pharynx clear of any exudate or lesions.Normal dentition.  Neck: normal, supple, no masses, no thyromegaly Respiratory: clear to auscultation bilaterally, no wheezing, no crackles. Normal respiratory effort. No accessory muscle use.  Cardiovascular: Bradycardic no murmurs / rubs / gallops. Trace lower extremity extremity edema. 2+ pedal  pulses. No carotid bruits.  Abdomen: no tenderness, no masses palpated. No hepatosplenomegaly. Bowel sounds positive.  Musculoskeletal: no clubbing / cyanosis. No joint deformity upper and lower extremities. Good ROM, no contractures. Normal muscle tone.  Skin: no rashes, lesions, ulcers. No induration Neurologic: CN 2-12 grossly intact. Sensation intact, DTR normal. Strength 5/5 in all 4.  Psychiatric:  Alert and oriented to person. Normal mood.     Labs on Admission: I have personally reviewed following labs and imaging studies  CBC:  Recent Labs Lab 04/26/16 1304 04/26/16 1440  WBC 5.3 5.5  NEUTROABS 3.4  --   HGB 8.1* 8.5*  HCT 24.0* 25.1*  MCV 102* 100.0  PLT 445* [id number]*   Basic Metabolic Panel:  Recent Labs Lab 04/26/16 1304 04/26/16 1440  NA 135 132*  K 4.3 4.1  CL 100 102  CO2 26 23  GLUCOSE 108 109*  BUN 19 23*  CREATININE 1.1 1.02  CALCIUM 8.7 8.4*   GFR: Estimated Creatinine Clearance: 49.4 mL/min (by C-G formula based on Cr of 1.02). Liver Function Tests:  Recent Labs Lab 04/26/16 1304  AST 21  ALT 21  ALKPHOS 40  BILITOT 0.90  PROT 5.4*  ALBUMIN 3.2*   No results for input(s): LIPASE, AMYLASE in the last 168 hours. No results for input(s): AMMONIA in the last 168 hours. Coagulation Profile: No results for input(s): INR, PROTIME in the last 168 hours. Cardiac Enzymes:  Recent Labs Lab 04/26/16 1440  TROPONINI <0.03   BNP (last 3 results) No results for input(s): PROBNP in the last 8760 hours. HbA1C: No results for input(s): HGBA1C in the last 72 hours. CBG:  Recent Labs Lab 04/26/16 1433  GLUCAP 107*   Lipid Profile: No results for input(s): CHOL, HDL, LDLCALC, TRIG, CHOLHDL, LDLDIRECT in the last 72 hours. Thyroid Function Tests: No results for input(s): TSH, T4TOTAL, FREET4, T3FREE, THYROIDAB in the last 72 hours. Anemia Panel: No results for input(s): VITAMINB12, FOLATE, FERRITIN, TIBC, IRON, RETICCTPCT in the last 72  hours. Urine analysis:    Component Value Date/Time   COLORURINE yellow 10/11/2009 0819   APPEARANCEUR Clear 10/11/2009 0819   LABSPEC 1.015 10/11/2009 0819   PHURINE 7.0 10/11/2009 0819   GLUCOSEU NEGATIVE 03/29/2008 2328   HGBUR negative 10/11/2009 0819   HGBUR NEGATIVE 03/29/2008 2328   BILIRUBINUR n 04/18/2016 1152   BILIRUBINUR negative 10/11/2009 0819   KETONESUR 15* 03/29/2008 2328   PROTEINUR n 04/18/2016 1152   PROTEINUR NEGATIVE 03/29/2008 2328   UROBILINOGEN 1.0 04/18/2016 1152   UROBILINOGEN 0.2 10/11/2009 0819   NITRITE n 04/18/2016 1152   NITRITE negative 10/11/2009 0819   LEUKOCYTESUR Negative 04/18/2016 1152   Sepsis Labs: No results found for this or any previous visit (from the past 240 hour(s)).   Radiological Exams on Admission: No results found.  EKG: Independently reviewed.Sinus bradycardia  Assessment/Plan Unresponsiveness with bradycardia: Acute. Patient became unresponsive with heart rates into the 20s. Patient was noted to lose bowel. No seizure-like activity seen per se, but on the differential includes symptomatic bradycardia versus seizure. - Admit to a telemetry bed  - Trend cardiac troponins - Check Echocardiogram - Atropine prn for prolonged bradycardia - Cardiology consult discussed case with Dr. Jeannine Boga at approximately 9:20 pm. Will follow recommendations- Question need of neurology evaluation.  Myelodysplastic syndrome/ refractory anemia: Patient missed his scheduled Procrit injection due to his syncopal event. - type & screen for possible need of blood products - Dr. Marin Olp follow-up with the patient in the hospital tomorrow  CAD - Continue aspirin, isosorbide mononitrate  Essential hypertension - Continue Lisinopril  Dementia: Stable. - Initially held patient's Namenda-donepezil combination pill secondary to question side effects of bradycardia associated with donepezil, restart if no concern for this  Hyperlipidemia  -  Continue simvastatin  DVT prophylaxis: Lovenox Code Status: Full  Family Communication: Discussed case with patient's daughter present at bedside Disposition Plan: Possible discharge home Consults called: Cardiology consult Admission status: Observation telemetry  Norval Morton MD [facility] Pager [phone]  If 7PM-7AM, please contact night-coverage [email or web address] Password [facility]  04/26/2016, 8:55 PM

## 2016-04-27 ENCOUNTER — Observation Stay (HOSPITAL_COMMUNITY): Payer: Medicare Other

## 2016-04-27 ENCOUNTER — Ambulatory Visit (HOSPITAL_BASED_OUTPATIENT_CLINIC_OR_DEPARTMENT_OTHER): Payer: Medicare Other

## 2016-04-27 ENCOUNTER — Other Ambulatory Visit: Payer: Self-pay

## 2016-04-27 DIAGNOSIS — I10 Essential (primary) hypertension: Secondary | ICD-10-CM | POA: Diagnosis not present

## 2016-04-27 DIAGNOSIS — R55 Syncope and collapse: Secondary | ICD-10-CM | POA: Diagnosis present

## 2016-04-27 DIAGNOSIS — R001 Bradycardia, unspecified: Secondary | ICD-10-CM | POA: Diagnosis not present

## 2016-04-27 DIAGNOSIS — R404 Transient alteration of awareness: Secondary | ICD-10-CM

## 2016-04-27 DIAGNOSIS — G309 Alzheimer's disease, unspecified: Secondary | ICD-10-CM | POA: Diagnosis not present

## 2016-04-27 DIAGNOSIS — I129 Hypertensive chronic kidney disease with stage 1 through stage 4 chronic kidney disease, or unspecified chronic kidney disease: Secondary | ICD-10-CM | POA: Diagnosis not present

## 2016-04-27 DIAGNOSIS — R4189 Other symptoms and signs involving cognitive functions and awareness: Secondary | ICD-10-CM | POA: Diagnosis present

## 2016-04-27 DIAGNOSIS — D469 Myelodysplastic syndrome, unspecified: Secondary | ICD-10-CM | POA: Diagnosis not present

## 2016-04-27 DIAGNOSIS — F0280 Dementia in other diseases classified elsewhere without behavioral disturbance: Secondary | ICD-10-CM | POA: Diagnosis not present

## 2016-04-27 LAB — URINALYSIS, ROUTINE W REFLEX MICROSCOPIC
Bilirubin Urine: NEGATIVE
GLUCOSE, UA: NEGATIVE mg/dL
HGB URINE DIPSTICK: NEGATIVE
KETONES UR: NEGATIVE mg/dL
LEUKOCYTES UA: NEGATIVE
Nitrite: NEGATIVE
PROTEIN: NEGATIVE mg/dL
Specific Gravity, Urine: 1.01 (ref 1.005–1.030)
pH: 7 (ref 5.0–8.0)

## 2016-04-27 LAB — SAVE SMEAR

## 2016-04-27 LAB — CBC
HCT: 27.5 % — ABNORMAL LOW (ref 39.0–52.0)
HEMATOCRIT: 21.5 % — AB (ref 39.0–52.0)
Hemoglobin: 7.1 g/dL — ABNORMAL LOW (ref 13.0–17.0)
Hemoglobin: 9.1 g/dL — ABNORMAL LOW (ref 13.0–17.0)
MCH: 31.4 pg (ref 26.0–34.0)
MCH: 30.1 pg (ref 26.0–34.0)
MCHC: 33 g/dL (ref 30.0–36.0)
MCHC: 33.1 g/dL (ref 30.0–36.0)
MCV: 95.1 fL (ref 78.0–100.0)
MCV: 91.1 fL (ref 78.0–100.0)
PLATELETS: 352 10*3/uL (ref 150–400)
Platelets: 383 10*3/uL (ref 150–400)
RBC: 2.26 MIL/uL — ABNORMAL LOW (ref 4.22–5.81)
RBC: 3.02 MIL/uL — AB (ref 4.22–5.81)
RDW: 21.8 % — ABNORMAL HIGH (ref 11.5–15.5)
RDW: 22.1 % — ABNORMAL HIGH (ref 11.5–15.5)
WBC: 8.4 10*3/uL (ref 4.0–10.5)
WBC: 6 10*3/uL (ref 4.0–10.5)

## 2016-04-27 LAB — ECHOCARDIOGRAM COMPLETE
CHL CUP MV DEC (S): 451
E/e' ratio: 7.45
EWDT: 451 ms
FS: 37 % (ref 28–44)
HEIGHTINCHES: 68 in
IV/PV OW: 0.96
LA diam end sys: 51 mm
LA diam index: 2.87 cm/m2
LA vol: 94.3 mL
LASIZE: 51 mm
LAVOLA4C: 99.3 mL
LAVOLIN: 53 mL/m2
LV PW d: 13.1 mm — AB (ref 0.6–1.1)
LV TDI E'LATERAL: 7.62
LVEEAVG: 7.45
LVEEMED: 7.45
LVELAT: 7.62 cm/s
LVOT area: 3.8 cm2
LVOT diameter: 22 mm
MV pk A vel: 104 m/s
MVPKEVEL: 56.8 m/s
TAPSE: 30 mm
TDI e' medial: 8.38
Weight: 2299.2 oz

## 2016-04-27 LAB — RETICULOCYTES: Reticulocyte Count: 0.6 % (ref 0.6–2.6)

## 2016-04-27 LAB — BASIC METABOLIC PANEL
Anion gap: 5 (ref 5–15)
BUN: 17 mg/dL (ref 6–20)
CHLORIDE: 105 mmol/L (ref 101–111)
CO2: 24 mmol/L (ref 22–32)
CREATININE: 0.97 mg/dL (ref 0.61–1.24)
Calcium: 8.5 mg/dL — ABNORMAL LOW (ref 8.9–10.3)
GFR calc Af Amer: >60 mL/min (ref >60)
GFR calc non Af Amer: >60 mL/min (ref >60)
Glucose, Bld: 103 mg/dL — ABNORMAL HIGH (ref 65–99)
Potassium: 4.3 mmol/L (ref 3.5–5.1)
Sodium: 134 mmol/L — ABNORMAL LOW (ref 135–145)

## 2016-04-27 LAB — FERRITIN: FERRITIN: 779 ng/mL — AB (ref 22–316)

## 2016-04-27 LAB — IRON AND TIBC
%SAT: 55 % (ref 20–55)
IRON: 102 ug/dL (ref 42–163)
TIBC: 184 ug/dL — AB (ref 202–409)
UIBC: 82 ug/dL — ABNORMAL LOW (ref 117–376)

## 2016-04-27 LAB — TROPONIN I
Troponin I: <0.03 ng/mL (ref <0.031)
Troponin I: <0.03 ng/mL (ref <0.031)

## 2016-04-27 LAB — PREPARE RBC (CROSSMATCH)

## 2016-04-27 LAB — MAGNESIUM: Magnesium: 1.8 mg/dL (ref 1.7–2.4)

## 2016-04-27 LAB — ABO/RH: ABO/RH(D): A POS

## 2016-04-27 MED ORDER — SODIUM CHLORIDE 0.9 % IV SOLN
Freq: Once | INTRAVENOUS | Status: AC
  Administered 2016-04-27: 10 mL/h via INTRAVENOUS

## 2016-04-27 MED ORDER — EPOETIN ALFA 40000 UNIT/ML IJ SOLN
40000.0000 [IU] | Freq: Once | INTRAMUSCULAR | Status: AC
  Administered 2016-04-27: 40000 [IU] via SUBCUTANEOUS
  Filled 2016-04-27: qty 1

## 2016-04-27 MED ORDER — ISOSORBIDE MONONITRATE ER 30 MG PO TB24
30.0000 mg | ORAL_TABLET | Freq: Every day | ORAL | Status: DC
  Administered 2016-04-27: 30 mg via ORAL
  Filled 2016-04-27: qty 1

## 2016-04-27 NOTE — Progress Notes (Signed)
EEG completed, results pending. 

## 2016-04-27 NOTE — Progress Notes (Signed)
[facility] PROGRESS NOTE  EYMEN HARGRAVES [id number] DOB: Aug 08, 1932 DOA: 04/26/2016   PCP: [name], MD     Assessment/Plan: Principal Problem:   Unresponsiveness Active Problems:   Essential hypertension   MDS (myelodysplastic syndrome) (HCC)   Alzheimer's dementia   Near syncope   Bradycardia   82 y.o. male with medical history significant of CAD, HTN, HLD, BPH, anemia, renal insufficiency MDS followed by Dr. Marin Olp; who presents after becoming unresponsive while at his regularly scheduled oncology follow-up visit. In the ER the patient was found . Initial hemoglobin was 8.5,  platelets 455, sodium 132, serum 4.1, BUN 23, creatinine 1.02.  Assessment and plan Syncope with bradycardia:  Likely vasovagal,Resolved. Patient became unresponsive with heart rates into the 20s. Patient was noted to Have stool incontinence. No seizure-like activity seen per se, but on the differential includes symptomatic bradycardia versus seizure. Telemetry shows no Events, normal sinus rhythm -cardiac enzymes negative  Echocardiogram Pending - Atropine prn for prolonged bradycardia - Cardiology consult discussed case with Dr. Jeannine Boga at approximately 9:20 pm.  Cardiology following, Will also obtain EEG to rule out underlying seizure   Myelodysplastic syndrome/ refractory anemia: Patient missed his scheduled Procrit injection due to his syncopal event. - type & screen for possible need of blood products -Discussed with  Dr. Marin Olp , Hemoglobin has dropped to 7.1, he recommends transfusion of 2 u PRBC Check stool guaiac , No evidence of any active bleed  CAD - Continue aspirin,  isosorbide mononitrate   Essential hypertension - Hold Lisinopril,   Cardura  Dementia: Stable. - Initially held patient's Namenda-donepezil combination pill secondary to question side effects of bradycardia associated with donepezil, restart if no concern for this  Hyperlipidemia  -  Continue simvastatin    DVT prophylaxsis Lovenox  Code Status:  Full code      Family Communication: Discussed in detail with the patient, wife, daughter, all imaging results, lab results explained to the patient   Disposition Plan:  Oncology, cardiology following     Consultants:  Cardiology  Oncology  Procedures:  None  Antibiotics: Anti-infectives    None         HPI/Subjective: Patient unable to provide comprehensive history secondary to underlying dementia, patient's wife and daughter by the bedside, patient is currently awake and oriented to self, denies any chest pain or shortness of breath   Objective: Filed Vitals:   04/27/16 0952 04/27/16 1115 04/27/16 1139 04/27/16 1200  BP: 140/54 150/54 169/58 145/52  Pulse: 57 58 57 58  Temp:  98.7 F (37.1 C) 98.8 F (37.1 C) 98.2 F (36.8 C)  TempSrc:  Oral Oral Oral  Resp:  18 18 18   Height:      Weight:      SpO2:  98% 98% 100%    Intake/Output Summary (Last 24 hours) at 04/27/16 1204 Last data filed at 04/27/16 1202  Gross per 24 hour  Intake   1570 ml  Output   1450 ml  Net    120 ml    Exam:  Examination:  General exam: Appears calm and comfortable  Respiratory system: Clear to auscultation. Respiratory effort normal. Cardiovascular system: S1 & S2 heard, RRR. No JVD, murmurs, rubs, gallops or clicks. No pedal edema. Gastrointestinal system: Abdomen is nondistended, soft and nontender. No organomegaly or masses felt. Normal bowel sounds heard. Central nervous system: Alert and oriented. No focal neurological deficits. Extremities: Symmetric 5 x 5 power. Skin: No rashes, lesions  or ulcers Psychiatry: Judgement and insight appear normal. Mood & affect appropriate.     Data Reviewed: I have personally reviewed following labs and imaging studies  Micro Results No results found for this or any previous visit (from the past 240 hour(s)).  Radiology Reports Dg Chest Port 1  View  04/26/2016  CLINICAL DATA:  Syncope today EXAM: PORTABLE CHEST 1 VIEW COMPARISON:  04/20/2015 FINDINGS: Mild cardiomegaly. Aortic tortuosity that is stable. Azygos fissure noted. Mild elevation the left diaphragm attributed to mild atelectasis. There is no edema, consolidation, effusion, or pneumothorax. IMPRESSION: Mild left basilar atelectasis. Electronically Signed   By: Monte Fantasia M.D.   On: 04/26/2016 22:13     CBC  Recent Labs Lab 04/26/16 1304 04/26/16 1440 04/27/16 0448  WBC 5.3 5.5 8.4  HGB 8.1* 8.5* 7.1*  HCT 24.0* 25.1* 21.5*  PLT 445* 455* 383  MCV 102* 100.0 95.1  MCH 34.5* 33.9 31.4  MCHC 33.8 33.9 33.0  RDW 20.6* 20.4* 21.8*  LYMPHSABS 1.4  --   --   EOSABS 0.1  --   --   BASOSABS 0.0  --   --     Chemistries   Recent Labs Lab 04/26/16 1304 04/26/16 1440 04/27/16 0448  NA 135 132* 134*  K 4.3 4.1 4.3  CL 100 102 105  CO2 26 23 24   GLUCOSE 108 109* 103*  BUN 19 23* 17  CREATININE 1.1 1.02 0.97  CALCIUM 8.7 8.4* 8.5*  MG  --   --  1.8  AST 21  --   --   ALT 21  --   --   ALKPHOS 40  --   --   BILITOT 0.90  --   --    ------------------------------------------------------------------------------------------------------------------ estimated creatinine clearance is 55.1 mL/min (by C-G formula based on Cr of 0.97). ------------------------------------------------------------------------------------------------------------------ No results for input(s): HGBA1C in the last 72 hours. ------------------------------------------------------------------------------------------------------------------ No results for input(s): CHOL, HDL, LDLCALC, TRIG, CHOLHDL, LDLDIRECT in the last 72 hours. ------------------------------------------------------------------------------------------------------------------ No results for input(s): TSH, T4TOTAL, T3FREE, THYROIDAB in the last 72 hours.  Invalid input(s):  FREET3 ------------------------------------------------------------------------------------------------------------------  Recent Labs  04/26/16 1304  FERRITIN 779*  TIBC 184*  IRON 102    Coagulation profile No results for input(s): INR, PROTIME in the last 168 hours.  No results for input(s): DDIMER in the last 72 hours.  Cardiac Enzymes  Recent Labs Lab 04/26/16 2249 04/27/16 0448 04/27/16 1056  TROPONINI <0.03 <0.03 <0.03   ------------------------------------------------------------------------------------------------------------------ Invalid input(s): POCBNP   CBG:  Recent Labs Lab 04/26/16 1433  GLUCAP 107*       Studies: Dg Chest Port 1 View  04/26/2016  CLINICAL DATA:  Syncope today EXAM: PORTABLE CHEST 1 VIEW COMPARISON:  04/20/2015 FINDINGS: Mild cardiomegaly. Aortic tortuosity that is stable. Azygos fissure noted. Mild elevation the left diaphragm attributed to mild atelectasis. There is no edema, consolidation, effusion, or pneumothorax. IMPRESSION: Mild left basilar atelectasis. Electronically Signed   By: Monte Fantasia M.D.   On: 04/26/2016 22:13      No results found for: HGBA1C Lab Results  Component Value Date   LDLCALC 49 04/18/2016   CREATININE 0.97 04/27/2016       Scheduled Meds: . acidophilus  1 capsule Oral Daily  . aspirin EC  81 mg Oral Daily  . dorzolamide  1 drop Both Eyes BID  . doxazosin  8 mg Oral QHS  . enoxaparin (LOVENOX) injection  40 mg Subcutaneous Q24H  . isosorbide mononitrate  30 mg Oral  Daily  . latanoprost  1 drop Both Eyes QHS  . lisinopril  2.5 mg Oral Daily  . multivitamin with minerals  1 tablet Oral Daily  . pantoprazole  40 mg Oral Daily  . vitamin B-6  250 mg Oral Daily  . simvastatin  40 mg Oral q1800  . cyanocobalamin  100 mcg Oral Daily   Continuous Infusions:    LOS: 1 day    Time spent: >30 MINS    [facility]  [facility] Pager [phone]. If 7PM-7AM, please contact  night-coverage at [email or web address], password [facility] 04/27/2016, 12:04 PM  LOS: 1 day

## 2016-04-27 NOTE — Consult Note (Signed)
CARDIOLOGY CONSULT NOTE   Patient ID: Alexander Rice MRN: [id number] DOB/AGE: 80-Oct-1933 80 y.o.  Admit date: 04/26/2016  Requesting Physician: Primary Physician:   [name], MD Primary Cardiologist:  None Reason for Consultation:   Syncope/Bradycardia  HPI: Alexander Rice is a 82 y.o. male with a reported PMH significant for CAD, HTN, dyslipidemia, CKD, MDS, and dementia who presented to an OSH after a syncopal event at his oncologist office.  At the time of my assessment the patient was alone in his room, and unfortunately his mental status does not allow for him to give a relevant history.  He is only oriented to himself, and cannot explain why he is in the hospital beyond "something happened with my heart."  What follows is obtained via conversation with the primary team at the time of consultation and from chart review.  Per report, the pt became unresponsive while sitting in a chair and was noted to be diaphoretic and ashen.  During his time his heart rate was noted to decrease from the 60s to the low 20s, and remained in this range for 5 to 10 minutes.  Upon presentation to the OSH his pulse rate remained stable in the 50-60bpm range, but he was intermittently hypotensive with SBP=90s at times.  He was subsequently transferred to [facility] for further evaluation and management.   Past Medical History  Diagnosis Date  . CAD (coronary artery disease)   . GERD (gastroesophageal reflux disease)   . Hyperlipidemia   . Hypertension   . BPH (benign prostatic hyperplasia)   . CVD (cardiovascular disease)   . Adenomatous colon polyp 02/1996, 02/2011    TA polyp 02/2011  . B12 deficiency   . Anemia in chronic renal disease 11/02/2011  . Esophageal stricture   . Diverticulosis   . Hemorrhoids   . MDS (myelodysplastic syndrome) ([place])   . Glaucoma   . Heart palpitations   . Hiatal hernia   . History of colonic polyps 09/29/2006    Polyps age 17, Dr. Fuller Plan stated no  further colonoscopy due to age. Confirmed with daughter given alzheimers, CAD history       Past Surgical History  Procedure Laterality Date  . Carotid endarterectomy    . Ptca      stent  . Quadriceps repair      muscle attachment  . Colonoscopy    . Inguinal hernia repair      right side/ twice  . Tonsillectomy    . Glaucoma surgery Bilateral 11/19/12    pt's report    No Known Allergies  I have reviewed the patient's current medications . acidophilus  1 capsule Oral Daily  . aspirin EC  81 mg Oral Daily  . dorzolamide  1 drop Both Eyes BID  . doxazosin  8 mg Oral QHS  . enoxaparin (LOVENOX) injection  40 mg Subcutaneous Q24H  . isosorbide mononitrate  30 mg Oral Daily  . latanoprost  1 drop Both Eyes QHS  . lisinopril  2.5 mg Oral Daily  . multivitamin with minerals  1 tablet Oral Daily  . pantoprazole  40 mg Oral Daily  . vitamin B-6  250 mg Oral Daily  . simvastatin  40 mg Oral q1800  . cyanocobalamin  100 mcg Oral Daily     albuterol, atropine, ondansetron **OR** ondansetron (ZOFRAN) IV  Prior to Admission medications   Medication Sig Start Date End Date Taking? Authorizing Provider  aspirin 81 MG tablet  Take 81 mg by mouth daily.     Yes Historical Provider, MD  [employer] OMEGA-3 KRILL OIL PO Take 350 mg by mouth daily. Reported on 01/16/2016   Yes Historical Provider, MD  cyanocobalamin 100 MCG tablet Take by mouth daily.   Yes Historical Provider, MD  dorzolamide (TRUSOPT) 2 % ophthalmic solution Place 1 drop into both eyes 2 (two) times daily.     Yes Historical Provider, MD  doxazosin (CARDURA) 8 MG tablet Take 1 tablet (8 mg total) by mouth at bedtime. 02/03/16  Yes Marin Olp, MD  lisinopril (PRINIVIL,ZESTRIL) 2.5 MG tablet Take 1 tablet (2.5 mg total) by mouth daily. 02/20/16  Yes Marin Olp, MD  LUMIGAN 0.01 % SOLN Place 1 drop into both eyes at bedtime.  03/23/11  Yes Historical Provider, MD  Memantine HCl-Donepezil HCl 28-10 MG CP24 Take 1 capsule by  mouth daily.   Yes Historical Provider, MD  mirabegron ER (MYRBETRIQ) 50 MG TB24 tablet Take 50 mg by mouth daily.   Yes Historical Provider, MD  Multiple Vitamin (MULTIVITAMIN) tablet Take 1 tablet by mouth daily.     Yes Historical Provider, MD  omeprazole (PRILOSEC) 20 MG capsule  06/28/15  Yes Historical Provider, MD  Probiotic Product ([employer] PROBIOTIC) CHEW Chew by mouth daily.   Yes Historical Provider, MD  Pyridoxine HCl (VITAMIN B-6) 250 MG tablet Take 1 tablet (250 mg total) by mouth daily. 05/17/15  Yes [name], MD  simvastatin (ZOCOR) 40 MG tablet TAKE 1 TABLET BY MOUTH  DAILY AT 6 PM 01/16/16  Yes Marin Olp, MD  isosorbide mononitrate (IMDUR) 30 MG 24 hr tablet Take 1 tablet by mouth  daily 11/29/15   Marin Olp, MD     Social History   Social History  . Marital Status: Married    Spouse Name: N/A  . Number of Children: 3  . Years of Education: N/A   Occupational History  . Retired    Social History Main Topics  . Smoking status: Former Smoker    Quit date: 11/19/1968  . Smokeless tobacco: Never Used     Comment: never used tobacco  . Alcohol Use: 4.2 oz/week    7 Standard drinks or equivalent per week     Comment: Wine/Beer  . Drug Use: No  . Sexual Activity: Not on file   Other Topics Concern  . Not on file   Social History Narrative   Married (wife patient of Dr. Yong Channel as well)      Retired from [employer] logistics/support/administrative officer and [place]      Hobbies: Scientist, research (physical sciences) [employer], [employer] .    Family Status  Relation Status Death Age  . Mother Deceased 50  . Father Deceased 79   Family History  Problem Relation Age of Onset  . COPD Mother   . Alcohol abuse Father      ROS:  Full 14 point review of systems complete and found to be negative unless listed above.  Physical Exam: Blood pressure 156/56, pulse 54, temperature 97.9 F (36.6 C), temperature source Oral, resp. rate 15, height 5\' 8"  (1.727 m), weight 66.225 kg (146 lb), SpO2 98 %.   General: elderly, frail appearing, NAD, oriented only to person Head: Eyes PERRLA, No xanthomas.   Normocephalic and atraumatic, oropharynx without edema or exudate. Dentition:  Lungs: CTAB, no w/r/c Heart: bradycardic with HR=50s, +S1 +S2, soft and mid-peaking II/VI SEM at the LUSB without radiation to the carotids, no  LE edema Neck: No carotid bruits. No lymphadenopathy.  JVD. Abdomen: Bowel sounds present, abdomen soft and non-tender without masses or hernias noted. Msk:  No spine or cva tenderness. No weakness, no joint deformities or effusions. Extremities: No clubbing or cyanosis.  edema.  Neuro: Alert and oriented X 3. No focal deficits noted. Psych:  Good affect, responds appropriately Skin: No rashes or lesions noted.  Labs:   Lab Results  Component Value Date   WBC 5.5 04/26/2016   HGB 8.5* 04/26/2016   HCT 25.1* 04/26/2016   MCV 100.0 04/26/2016   PLT 455* 04/26/2016   No results for input(s): INR in the last 72 hours.  Recent Labs Lab 04/26/16 1304 04/26/16 1440  NA 135 132*  K 4.3 4.1  CL 100 102  CO2 26 23  BUN 19 23*  CREATININE 1.1 1.02  CALCIUM 8.7 8.4*  PROT 5.4*  --   BILITOT 0.90  --   ALKPHOS 40  --   ALT 21  --   AST 21  --   GLUCOSE 108 109*  ALBUMIN 3.2*  --    No results found for: MG  Recent Labs  04/26/16 1440 04/26/16 2249  TROPONINI <0.03 <0.03   No results for input(s): TROPIPOC in the last 72 hours. PRO B NATRIURETIC PEPTIDE (BNP)  Date/Time Value Ref Range Status  03/29/2008 10:50 PM 79.0  Final  03/22/2008 04:31 PM 329.0* 0.0-100.0 pg/mL Final   Lab Results  Component Value Date   CHOL 117 04/18/2016   HDL 58.80 04/18/2016   LDLCALC 49 04/18/2016   TRIG 46.0 04/18/2016   No results found for: [facility] LIPASE  Date/Time Value Ref Range Status  03/22/2015 04:56 PM 33 22 - 51 U/L Final   TSH  Date/Time Value Ref Range Status  07/21/2013 10:10 AM 2.63 0.35 - 5.50 uIU/mL Final   VITAMIN B12  Date/Time Value Ref Range  Status  12/16/2015 01:32 PM >2000* 211 - 946 pg/mL Final   VITAMIN B-12  Date/Time Value Ref Range Status  04/18/2016 09:51 AM >1500* 211 - 911 pg/mL Final   FOLATE  Date/Time Value Ref Range Status  03/29/2008 08:48 AM > 20.0 ng/mL ng/mL Final    Comment:    See lab report for associated comment(s)   FERRITIN  Date/Time Value Ref Range Status  04/05/2016 02:03 PM 1,045* 30 - 400 ng/mL Final  03/15/2016 01:26 PM 929* 22 - 316 ng/ml Final  04/02/2013 09:31 AM 606* 22 - 322 ng/mL Final   TIBC  Date/Time Value Ref Range Status  03/15/2016 01:26 PM 181* 202 - 409 ug/dL Final  04/02/2013 09:31 AM NOT CALC 215 - 435 ug/dL Final    Comment:    TIBC and %SAT were not calculated due to the UIBC being <15.   TOTAL IRON BINDING CAPACITY  Date/Time Value Ref Range Status  04/05/2016 02:03 PM 190* 250 - 450 ug/dL Final   IRON  Date/Time Value Ref Range Status  04/05/2016 02:03 PM 104 38 - 169 ug/dL Final  03/15/2016 01:26 PM 79 42 - 163 ug/dL Final  04/02/2013 09:31 AM 226* 42 - 165 ug/dL Final   RETIC %  Date/Time Value Ref Range Status  04/22/2008 01:33 PM 0.8 0.7 - 2.3 % Final   RETIC CT PCT  Date/Time Value Ref Range Status  11/25/2015 10:26 AM 1.0 0.4 - 2.3 % Final    Echo: none  ECG:  Per my review, sinus bradycardia without evidence of high-grade block  Radiology:  Dg Chest Port 1 View  04/26/2016  CLINICAL DATA:  Syncope today EXAM: PORTABLE CHEST 1 VIEW COMPARISON:  04/20/2015 FINDINGS: Mild cardiomegaly. Aortic tortuosity that is stable. Azygos fissure noted. Mild elevation the left diaphragm attributed to mild atelectasis. There is no edema, consolidation, effusion, or pneumothorax. IMPRESSION: Mild left basilar atelectasis. Electronically Signed   By: Monte Fantasia M.D.   On: 04/26/2016 22:13    ASSESSMENT AND PLAN:    ANTHEM BUTIKOFER is a 82 y.o. male with a reported PMH significant for CAD, HTN, dyslipidemia, CKD, MDS, and dementia who presented to an OSH  after a syncopal event at his oncologist office.  There is obvious concern for a primary bradycardic etiology, but unfortunately there are not rhythm strips or ECGs from the time of his sustained bradycardia to review.    1) Syncope; concern for bradycardia-mediated event - continuous telemetry - repeat 12 lead ECG if HR<40bpm - avoid all AV nodal blocking medications - TTE in am - maintain Mg>2 and K>4 - consideration of PPM pending w/u results - consideration of neurologic etiology as per primary team  2) CAD; no evidence of active myocardial ischemia - ASA 81mg  PO QDAY - hold Imdur in the setting of hypotension at OSH  3) HTN; hypotensive with SBP=90s prior to transfer - hold Lisinopril and Doxazosin for now  4) Dyslipidemia; - given known CAD pt meets criteria for high-intensity statin therapy with either Rosuvastatin or Atorvastatin   Signed: Clayborne Dana, MD 04/27/2016 1:07 AM

## 2016-04-27 NOTE — Progress Notes (Signed)
1st unit PRBC started @ 1124.  Daughter and wife at bedside and updated on new orders today - all stated understanding

## 2016-04-27 NOTE — Progress Notes (Signed)
2nd unit PRBC infused.  F/U CBC ordered for 1930.  Patient's daughter at bedside and confirmed she spoke with Dr. Marlou Porch and is aware of plan.

## 2016-04-27 NOTE — Progress Notes (Signed)
Bedside EEG starting.  Pt sitting in chair and denies any discomfort.

## 2016-04-27 NOTE — Care Management Obs Status (Signed)
[place] NOTIFICATION   Patient Details  Name: CEPHAS VALDOVINOS MRN: [id number] Date of Birth: [birth year]   Medicare Observation Status Notification Given:  Yes    Royston Bake, RN 04/27/2016, 3:43 PM

## 2016-04-27 NOTE — Progress Notes (Addendum)
Patient Name: RUTVIK REUTER Date of Encounter: 04/27/2016  Hospital Problem List     Principal Problem:   Unresponsiveness Active Problems:   Essential hypertension   MDS (myelodysplastic syndrome) (HCC)   Alzheimer's dementia   Near syncope   Bradycardia    Subjective   Feeling well this morning. No reports of dizziness, lightheadedness, or syncope.  Inpatient Medications    . acidophilus  1 capsule Oral Daily  . aspirin EC  81 mg Oral Daily  . dorzolamide  1 drop Both Eyes BID  . doxazosin  8 mg Oral QHS  . enoxaparin (LOVENOX) injection  40 mg Subcutaneous Q24H  . isosorbide mononitrate  30 mg Oral Daily  . latanoprost  1 drop Both Eyes QHS  . lisinopril  2.5 mg Oral Daily  . multivitamin with minerals  1 tablet Oral Daily  . pantoprazole  40 mg Oral Daily  . vitamin B-6  250 mg Oral Daily  . simvastatin  40 mg Oral q1800  . cyanocobalamin  100 mcg Oral Daily    Vital Signs    Filed Vitals:   04/27/16 0952 04/27/16 1115 04/27/16 1139 04/27/16 1200  BP: 140/54 150/54 169/58 145/52  Pulse: 57 58 57 58  Temp:  98.7 F (37.1 C) 98.8 F (37.1 C) 98.2 F (36.8 C)  TempSrc:  Oral Oral Oral  Resp:  18 18 18   Height:      Weight:      SpO2:  98% 98% 100%    Intake/Output Summary (Last 24 hours) at 04/27/16 1202 Last data filed at 04/27/16 1202  Gross per 24 hour  Intake   1570 ml  Output   1450 ml  Net    120 ml   Filed Weights   04/26/16 1425 04/26/16 2054 04/27/16 0656  Weight: 145 lb (65.772 kg) 146 lb (66.225 kg) 143 lb 11.2 oz (65.182 kg)    Physical Exam    General: Pleasant frail older male, NAD. Neuro: Alert and oriented X 3. Moves all extremities spontaneously. Psych: Normal affect. HEENT:  Normal  Neck: Supple without bruits or JVD. Lungs:  Resp regular and unlabored, CTA. Heart: RRR no s3, s4, or murmurs. Abdomen: Soft, non-tender, non-distended, BS + x 4.  Extremities: No clubbing, cyanosis or edema. DP/PT/Radials 2+ and equal  bilaterally.  Labs    CBC  Recent Labs  04/26/16 1304 04/26/16 1440 04/27/16 0448  WBC 5.3 5.5 8.4  NEUTROABS 3.4  --   --   HGB 8.1* 8.5* 7.1*  HCT 24.0* 25.1* 21.5*  MCV 102* 100.0 95.1  PLT 445* 455* [id number]   Basic Metabolic Panel  Recent Labs  04/26/16 1440 04/27/16 0448  NA 132* 134*  K 4.1 4.3  CL 102 105  CO2 23 24  GLUCOSE 109* 103*  BUN 23* 17  CREATININE 1.02 0.97  CALCIUM 8.4* 8.5*  MG  --  1.8   Liver Function Tests  Recent Labs  04/26/16 1304  AST 21  ALT 21  ALKPHOS 40  BILITOT 0.90  PROT 5.4*  ALBUMIN 3.2*   Cardiac Enzymes  Recent Labs  04/26/16 2249 04/27/16 0448 04/27/16 1056  TROPONINI <0.03 <0.03 <0.03    Telemetry    SB Rate-49-60  ECG    SR Rate-68 No acute ST/T wave abnormalities. Previous tracing appears to have misplacement of leads.   Radiology    Dg Chest Port 1 View  04/26/2016  CLINICAL DATA:  Syncope today EXAM: PORTABLE CHEST 1  VIEW COMPARISON:  04/20/2015 FINDINGS: Mild cardiomegaly. Aortic tortuosity that is stable. Azygos fissure noted. Mild elevation the left diaphragm attributed to mild atelectasis. There is no edema, consolidation, effusion, or pneumothorax. IMPRESSION: Mild left basilar atelectasis. Electronically Signed   By: Monte Fantasia M.D.   On: 04/26/2016 22:13    Assessment & Plan    SHASHWAT MELDRUM is a 82 y.o. male with a reported PMH significant for CAD, HTN, dyslipidemia, CKD, MDS, and dementia who presented to an OSH after a syncopal event at his oncologist office. There is obvious concern for a primary bradycardic etiology, but unfortunately there are not rhythm strips or ECGs from the time of his sustained bradycardia to review.   1. Syncope: Has felt well throughout the morning, no recurrent episodes of bradycardia, light-headedness, or dizziness. -- continuous telemetry, no significant bradycardia noted so far -- avoid all AV nodal blocking medications -- maintain Mg>2 and K>4 --  consider 30 day event monitor --Troponin neg x3 --2D echo pending   2. CAD: no evidence of active myocardial ischemia - ASA 81mg  PO daily - Restarted Imdur given hypotension has improved.  3. HTN: Home medications initially held in the setting of hypotension. --Would resume home doses  4. Dyslipidemia: - given known CAD pt meets criteria for high-intensity statin therapy with either Rosuvastatin or Atorvastatin  5. MDS: Hgb dropped from 8.5>>7.1. Receiving 1 unit of PRBCs  --Per primary team  Signed, Reino Bellis NP-C Pager [phone]  Personally seen and examined. Agree with above.  82 year old male with memory impairment and syncopal episode.  - No obvious arrhythmias on telemetry during hospitalization.  - Sinus bradycardia noted at times in the 40s at night.  - Anemia, hypotension likely playing a role as well, even with heart rates in the 60s  - At this point, no clear indication for pacemaker. Heart rate on ECG on 04/27/16 demonstrated sinus rhythm rate 62 bpm. No AV prolongation.  - Would continue to hold off using home medications given recent hypotension. May wish to resume lisinopril 2.5 mg in the next several days. Avoid AV nodal blocking agents. May wish to avoid restarting isosorbide, no reported anginal symptoms recently. Also, doxazosin is notorious for orthostatic hypotension.  - Echo pending  - Discussed with Curt Bears his daughter at 65 6-3 2 7-0 970 We will follow  Candee Furbish, MD

## 2016-04-27 NOTE — Telephone Encounter (Signed)
Gave verbal okay to add OT visits to end of therapy.

## 2016-04-27 NOTE — Procedures (Signed)
History: 82 yo M with syncope  Sedation: None  Technique: This is a 21 channel routine scalp EEG performed at the bedside with bipolar and monopolar montages arranged in accordance to the international 10/20 system of electrode placement. One channel was dedicated to EKG recording.    Background: The background consists of intermixed alpha and beta activities. There is a well defined posterior dominant rhythm of 8 Hz that attenuates with eye opening. Sleep is not recorded but there is some increased delta associated with drowsiness.   Photic stimulation: Physiologic driving is not performed  EEG Abnormalities: None  Clinical Interpretation: This normal EEG is recorded in the waking and drowsy state. There was no seizure or seizure predisposition recorded on this study. Please note that a normal EEG does not preclude the possibility of epilepsy.   Roland Rack, MD [facility] [phone]  If 7pm- 7am, please page neurology on call as listed in [place].

## 2016-04-27 NOTE — Progress Notes (Signed)
  Echocardiogram 2D Echocardiogram has been performed.  Alexander Rice 04/27/2016, 11:40 AM

## 2016-04-27 NOTE — Telephone Encounter (Signed)
That's perfect - thanks!

## 2016-04-28 ENCOUNTER — Telehealth: Payer: Self-pay | Admitting: *Deleted

## 2016-04-28 DIAGNOSIS — G309 Alzheimer's disease, unspecified: Secondary | ICD-10-CM | POA: Diagnosis not present

## 2016-04-28 DIAGNOSIS — I10 Essential (primary) hypertension: Secondary | ICD-10-CM | POA: Diagnosis not present

## 2016-04-28 DIAGNOSIS — R001 Bradycardia, unspecified: Secondary | ICD-10-CM | POA: Diagnosis not present

## 2016-04-28 DIAGNOSIS — R404 Transient alteration of awareness: Secondary | ICD-10-CM | POA: Diagnosis not present

## 2016-04-28 DIAGNOSIS — R55 Syncope and collapse: Secondary | ICD-10-CM | POA: Diagnosis not present

## 2016-04-28 LAB — TYPE AND SCREEN
ABO/RH(D): A POS
ANTIBODY SCREEN: NEGATIVE
Unit division: 0
Unit division: 0

## 2016-04-28 LAB — CBC
HCT: 27 % — ABNORMAL LOW (ref 39.0–52.0)
Hemoglobin: 8.8 g/dL — ABNORMAL LOW (ref 13.0–17.0)
MCH: 30.3 pg (ref 26.0–34.0)
MCHC: 32.6 g/dL (ref 30.0–36.0)
MCV: 93.1 fL (ref 78.0–100.0)
PLATELETS: 335 10*3/uL (ref 150–400)
RBC: 2.9 MIL/uL — ABNORMAL LOW (ref 4.22–5.81)
RDW: 22.9 % — AB (ref 11.5–15.5)
WBC: 6 10*3/uL (ref 4.0–10.5)

## 2016-04-28 LAB — COMPREHENSIVE METABOLIC PANEL
ALBUMIN: 3.1 g/dL — AB (ref 3.5–5.0)
ALT: 23 U/L (ref 17–63)
AST: 20 U/L (ref 15–41)
Alkaline Phosphatase: 43 U/L (ref 38–126)
Anion gap: 4 — ABNORMAL LOW (ref 5–15)
BUN: 20 mg/dL (ref 6–20)
CHLORIDE: 105 mmol/L (ref 101–111)
CO2: 24 mmol/L (ref 22–32)
Calcium: 8.5 mg/dL — ABNORMAL LOW (ref 8.9–10.3)
Creatinine, Ser: 1.08 mg/dL (ref 0.61–1.24)
GFR calc Af Amer: >60 mL/min (ref >60)
Glucose, Bld: 94 mg/dL (ref 65–99)
POTASSIUM: 4.5 mmol/L (ref 3.5–5.1)
Sodium: 133 mmol/L — ABNORMAL LOW (ref 135–145)
Total Bilirubin: 1.4 mg/dL — ABNORMAL HIGH (ref 0.3–1.2)
Total Protein: 4.9 g/dL — ABNORMAL LOW (ref 6.5–8.1)

## 2016-04-28 MED ORDER — MEMANTINE HCL 5 MG PO TABS: 5.0000 mg | ORAL_TABLET | Freq: Two times a day (BID) | ORAL | Status: DC

## 2016-04-28 MED ORDER — LISINOPRIL 5 MG PO TABS: 5.0000 mg | ORAL_TABLET | Freq: Every day | ORAL | Status: DC

## 2016-04-28 MED ORDER — HYDRALAZINE HCL 20 MG/ML IJ SOLN
10.0000 mg | Freq: Once | INTRAMUSCULAR | Status: AC
  Administered 2016-04-28: 10 mg via INTRAVENOUS
  Filled 2016-04-28: qty 1

## 2016-04-28 MED ORDER — DOXAZOSIN MESYLATE 4 MG PO TABS: 4.0000 mg | ORAL_TABLET | Freq: Every day | ORAL | Status: DC

## 2016-04-28 NOTE — Telephone Encounter (Signed)
Spoke to pt's wife. Pt is sill in the hospital being released today. Told her to have pt call Monday morning to schedule hospital follow up with Dr. Yong Channel. Mrs. Ulrich said she will have her daughter call and schedule. Told her okay.

## 2016-04-28 NOTE — Evaluation (Signed)
Physical Therapy Evaluation Patient Details Name: Alexander Rice MRN: [id number] DOB: 03-08-1932 Today's Date: 04/28/2016   History of Present Illness  82 y.o. male with medical history significant of CAD, HTN, HLD, BPH, anemia, renal insufficiency MDS followed by Dr. Marin Olp; who presents after becoming unresponsive while at his regularly scheduled oncology follow-up visit  Clinical Impression  Patient demonstrates deficits in functional mobility as indicated below. Will need continued skilled PT to address deficits and maximize function. Will see as indicated and progress as tolerated.  Unsure of patient's true baseline, patient very poor historian and appears confused throughout session, will need 24/7 assist upon acute discharge when returns home. If family is not able to provide this level of care may need to consider SNF options but ideally given baseline dementia, returning home to familiar environment is safest for patient. Recommend HHPT if patient continues to demonstrate mobility deficits.    Follow Up Recommendations Supervision/Assistance - 24 hour;Supervision for mobility/OOB, Home Health PT    Equipment Recommendations  None recommended by PT    Recommendations for Other Services       Precautions / Restrictions Precautions Precautions: Fall Restrictions Weight Bearing Restrictions: No      Mobility  Bed Mobility Overal bed mobility: Needs Assistance Bed Mobility: Supine to Sit     Supine to sit: Supervision     General bed mobility comments: Multi modal cues to come to EOB  Transfers Overall transfer level: Needs assistance Equipment used: Rolling walker (2 wheeled) Transfers: Sit to/from Stand Sit to Stand: Min assist         General transfer comment: min assist for stability and safety due to cognitive deficits and poor safety awareness  Ambulation/Gait Ambulation/Gait assistance: Min assist Ambulation Distance (Feet): 90 Feet Assistive  device: Rolling walker (2 wheeled) Gait Pattern/deviations: Step-through pattern;Decreased stride length;Staggering left;Staggering right;Trunk flexed;Narrow base of support Gait velocity: decreased   General Gait Details: patient with poor ability to ambulate with RW safely despite several cues and assist. Patient with poor positioning and several noted LOB requiring assist to stabilize. During ambulation patient incontinent of urine. Assisted with bathing and hygiene  Stairs            Wheelchair Mobility    Modified Rankin (Stroke Patients Only)       Balance Overall balance assessment: Needs assistance   Sitting balance-Leahy Scale: Fair       Standing balance-Leahy Scale: Poor Standing balance comment: assist for stability during functional tasks                             Pertinent Vitals/Pain Pain Assessment: No/denies pain    Home Living Family/patient expects to be discharged to:: Private residence Living Arrangements: Spouse/significant other Available Help at Discharge: Family;Available 24 hours/day Type of Home: House Home Access: Stairs to enter Entrance Stairs-Rails: Psychiatric nurse of Steps: 5 Home Layout: Multi-level Home Equipment: Walker - 2 wheels;Cane - single point Additional Comments: patient is poor historian, unsure of accuracy, information obtained from previous admission and chart review    Prior Function Level of Independence: Needs assistance   Gait / Transfers Assistance Needed: supervision     Comments: states that he walks with cane     Hand Dominance   Dominant Hand: Right    Extremity/Trunk Assessment   Upper Extremity Assessment: Generalized weakness           Lower Extremity Assessment: Generalized weakness  Communication   Communication: HOH  Cognition Arousal/Alertness: Awake/alert Behavior During Therapy: Impulsive Overall Cognitive Status: History of cognitive  impairments - at baseline                      General Comments      Exercises        Assessment/Plan    PT Assessment Patient needs continued PT services  PT Diagnosis Difficulty walking;Abnormality of gait;Altered mental status   PT Problem List Decreased strength;Decreased activity tolerance;Decreased balance;Decreased mobility;Decreased cognition;Decreased knowledge of use of DME;Decreased safety awareness  PT Treatment Interventions DME instruction;Gait training;Stair training;Functional mobility training;Therapeutic activities;Therapeutic exercise;Balance training;Patient/family education   PT Goals (Current goals can be found in the Care Plan section) Acute Rehab PT Goals Patient Stated Goal: to go home PT Goal Formulation: With patient Time For Goal Achievement: 05/12/16 Potential to Achieve Goals: Fair    Frequency Min 3X/week   Barriers to discharge        Co-evaluation               End of Session Equipment Utilized During Treatment: Gait belt Activity Tolerance: Patient tolerated treatment well;Other (comment) (limited by incontience and cognitive deficits) Patient left: in chair;with call bell/phone within reach;with chair alarm set Nurse Communication: Mobility status    Functional Assessment Tool Used: clinical judgement Functional Limitation: Mobility: Walking and moving around Mobility: Walking and Moving Around Current Status [id number]): At least 20 percent but less than 40 percent impaired, limited or restricted Mobility: Walking and Moving Around Goal Status [phone]): At least 20 percent but less than 40 percent impaired, limited or restricted    Time: 0920-0944 PT Time Calculation (min) (ACUTE ONLY): 24 min   Charges:   PT Evaluation $PT Eval Moderate Complexity: 1 Procedure     PT G Codes:   PT G-Codes **NOT FOR INPATIENT CLASS** Functional Assessment Tool Used: clinical judgement Functional Limitation: Mobility: Walking and  moving around Mobility: Walking and Moving Around Current Status [id number]): At least 20 percent but less than 40 percent impaired, limited or restricted Mobility: Walking and Moving Around Goal Status [phone]): At least 20 percent but less than 40 percent impaired, limited or restricted    Duncan Dull 04/28/2016, 1:58 PM Alben Deeds, [place] DPT  [phone]

## 2016-04-28 NOTE — Care Management Note (Signed)
Case Management Note  Patient Details  Name: [name] MRN: [id number] Date of Birth: 1932-07-04  Subjective/Objective:                  Per H&P: Principal Problem:  Unresponsiveness Active Problems:  Essential hypertension  MDS (myelodysplastic syndrome)   Alzheimer's dementia  Near syncope  Bradycardia  Action/Plan: Cm spoke to wife and daughter Caanan Voit) at the bedside due to patient having alzheimer's. Daughter is HCPOA. Cm spoke to family about [facility] services and they said that PT was ordered prior to admission. Cm explained that RN and aide were also ordered and family was agreeable. CM called [facility] care and spoke with Sharyl Nimrod, RN who accepted referral. Cm faxed orders for PT, RN, and aide; face sheet; and face to face to [facility] @ [phone] and confirmation page received. Family states that they have all of the DMe needed and that his spouse will be at home to provide 24 hour care. The daughter, Alexander Rice can be contacted @ [phone]. No further CM needs identified at this time and patient is preparing to be discharged home.   Expected Discharge Date:  04/28/16               Expected Discharge Plan:  [place]  In-House Referral:     Discharge planning Services  CM Consult  Post Acute Care Choice:  Home Health Choice offered to:  Spouse, Adult Children  DME Arranged:    DME Agency:     HH Arranged:  RN, PT, Nurse's Aide [place] Agency:   Passenger transport manager [place])  Status of Service:  Completed, signed off  Medicare Important Message Given:    Date Medicare IM Given:    Medicare IM give by:    Date Additional Medicare IM Given:    Additional Medicare Important Message give by:     If discussed at [place] of Stay Meetings, dates discussed:    Additional Comments:  Guido Sander, RN 04/28/2016, 2:17 PM

## 2016-04-28 NOTE — Discharge Summary (Addendum)
Physician Discharge Summary  Alexander Rice MRN: [id number] DOB/AGE: 1932/10/28 82 y.o.  PCP: [name], MD   Admit date: 04/26/2016 Discharge date: 04/28/2016  Discharge Diagnoses:   Principal Problem:   Unresponsiveness Active Problems:   Essential hypertension   MDS (myelodysplastic syndrome) ([place])   Alzheimer's dementia   Near syncope   Bradycardia    Follow-up recommendations Follow-up with PCP in 3-5 days , including all  additional recommended appointments as below Follow-up CBC, CMP in 3-5 days Discussed with   Daughter Curt Bears [phone] , told her to give lisinopril 2.5 mg if SBP <150, give lisinopril  5 mg if >619 systolic    Current Discharge Medication List    START taking these medications   Details  memantine (NAMENDA) 5 MG tablet Take 1 tablet (5 mg total) by mouth 2 (two) times daily. Qty: 60 tablet, Refills: 0      CONTINUE these medications which have CHANGED   Details  doxazosin (CARDURA) 4 MG tablet Take 1 tablet (4 mg total) by mouth at bedtime. Qty: 30 tablet, Refills: 1    lisinopril (PRINIVIL,ZESTRIL) 5 MG tablet Take 1 tablet (5 mg total) by mouth daily. Qty: 30 tablet, Refills: 1      CONTINUE these medications which have NOT CHANGED   Details  aspirin 81 MG tablet Take 81 mg by mouth daily.      [employer] OMEGA-3 KRILL OIL PO Take 350 mg by mouth daily. Reported on 01/16/2016    cyanocobalamin 100 MCG tablet Take by mouth daily.    dorzolamide (TRUSOPT) 2 % ophthalmic solution Place 1 drop into both eyes 2 (two) times daily.      LUMIGAN 0.01 % SOLN Place 1 drop into both eyes at bedtime.     mirabegron ER (MYRBETRIQ) 50 MG TB24 tablet Take 50 mg by mouth daily.   Associated Diagnoses: MDS (myelodysplastic syndrome) (H[facility])    Multiple Vitamin (MULTIVITAMIN) tablet Take 1 tablet by mouth daily.      omeprazole (PRILOSEC) 20 MG capsule     Probiotic Product ([employer] PROBIOTIC) CHEW Chew by mouth daily.    Pyridoxine HCl (VITAMIN  B-6) 250 MG tablet Take 1 tablet (250 mg total) by mouth daily. Qty: 90 tablet, Refills: 3   Associated Diagnoses: MDS (myelodysplastic syndrome) (HCC)    simvastatin (ZOCOR) 40 MG tablet TAKE 1 TABLET BY MOUTH  DAILY AT 6 PM Qty: 90 tablet, Refills: 2      STOP taking these medications     Memantine HCl-Donepezil HCl 28-10 MG CP24      isosorbide mononitrate (IMDUR) 30 MG 24 hr tablet               Discharge Condition: Stable   Discharge Instructions Get Medicines reviewed and adjusted: Please take all your medications with you for your next visit with your Primary MD  Please request your Primary MD to go over all hospital tests and procedure/radiological results at the follow up, please ask your Primary MD to get all Hospital records sent to his/her office.  If you experience worsening of your admission symptoms, develop shortness of breath, life threatening emergency, suicidal or homicidal thoughts you must seek medical attention immediately by calling 911 or calling your MD immediately if symptoms less severe.  You must read complete instructions/literature along with all the possible adverse reactions/side effects for all the Medicines you take and that have been prescribed to you. Take any new Medicines after you have completely understood and accpet all  the possible adverse reactions/side effects.   Do not drive when taking Pain medications.   Do not take more than prescribed Pain, Sleep and Anxiety Medications  Special Instructions: If you have smoked or chewed Tobacco in the last 2 yrs please stop smoking, stop any regular Alcohol and or any Recreational drug use.  Wear Seat belts while driving.  Please note  You were cared for by a hospitalist during your hospital stay. Once you are discharged, your primary care physician will handle any further medical issues. Please note that NO REFILLS for any discharge medications will be authorized once you are  discharged, as it is imperative that you return to your primary care physician (or establish a relationship with a primary care physician if you do not have one) for your aftercare needs so that they can reassess your need for medications and monitor your lab values.     No Known Allergies    Disposition: 01-Home or Self Care   Consults: Cardiology     Significant Diagnostic Studies:  Dg Chest Port 1 View  04/26/2016  CLINICAL DATA:  Syncope today EXAM: PORTABLE CHEST 1 VIEW COMPARISON:  04/20/2015 FINDINGS: Mild cardiomegaly. Aortic tortuosity that is stable. Azygos fissure noted. Mild elevation the left diaphragm attributed to mild atelectasis. There is no edema, consolidation, effusion, or pneumothorax. IMPRESSION: Mild left basilar atelectasis. Electronically Signed   By: Monte Fantasia M.D.   On: 04/26/2016 22:13    2-D echo  ------------------------------------------------------------------- Indications: Syncope 780.2.  ------------------------------------------------------------------- History: PMH: Coronary artery disease. Risk factors: Hypertension. Dyslipidemia.  ------------------------------------------------------------------- Study Conclusions  - Left ventricle: The cavity size was normal. Wall thickness was  increased in a pattern of mild LVH. Systolic function was normal.  The estimated ejection fraction was in the range of 60% to 65%.  Wall motion was normal; there were no regional wall motion  abnormalities. Doppler parameters are consistent with abnormal  left ventricular relaxation (grade 1 diastolic dysfunction). - Left atrium: The atrium was severely dilated.   EEG Clinical Interpretation: This normal EEG is recorded in the waking and drowsy state. There was no seizure or seizure predisposition recorded on this study. Please note that a normal EEG does not preclude the possibility of epilepsy   Filed Weights   04/26/16 2054  04/27/16 0656 04/28/16 0445  Weight: 66.225 kg (146 lb) 65.182 kg (143 lb 11.2 oz) 65.227 kg (143 lb 12.8 oz)     Microbiology: No results found for this or any previous visit (from the past 240 hour(s)).     Blood Culture No results found for: SDES, SPECREQUEST, CULT, REPTSTATUS    Labs: Results for orders placed or performed during the hospital encounter of 04/26/16 (from the past 48 hour(s))  CBG monitoring, ED     Status: Abnormal   Collection Time: 04/26/16  2:33 PM  Result Value Ref Range   Glucose-Capillary 107 (H) 65 - 99 mg/dL  Basic metabolic panel     Status: Abnormal   Collection Time: 04/26/16  2:40 PM  Result Value Ref Range   Sodium 132 (L) 135 - 145 mmol/L   Potassium 4.1 3.5 - 5.1 mmol/L   Chloride 102 101 - 111 mmol/L   CO2 23 22 - 32 mmol/L   Glucose, Bld 109 (H) 65 - 99 mg/dL   BUN 23 (H) 6 - 20 mg/dL   Creatinine, Ser 1.02 0.61 - 1.24 mg/dL   Calcium 8.4 (L) 8.9 - 10.3 mg/dL   GFR calc non Af  Amer >60 >60 mL/min   GFR calc Af Amer >60 >60 mL/min    Comment: (NOTE) The eGFR has been calculated using the CKD EPI equation. This calculation has not been validated in all clinical situations. eGFR's persistently <60 mL/min signify possible Chronic Kidney Disease.    Anion gap 7 5 - 15  CBC     Status: Abnormal   Collection Time: 04/26/16  2:40 PM  Result Value Ref Range   WBC 5.5 4.0 - 10.5 K/uL   RBC 2.51 (L) 4.22 - 5.81 MIL/uL   Hemoglobin 8.5 (L) 13.0 - 17.0 g/dL   HCT 25.1 (L) 39.0 - 52.0 %   MCV 100.0 78.0 - 100.0 fL   MCH 33.9 26.0 - 34.0 pg   MCHC 33.9 30.0 - 36.0 g/dL   RDW 20.4 (H) 11.5 - 15.5 %   Platelets 455 (H) 150 - 400 K/uL  Troponin I     Status: None   Collection Time: 04/26/16  2:40 PM  Result Value Ref Range   Troponin I <0.03 <0.031 ng/mL    Comment:        NO INDICATION OF MYOCARDIAL INJURY.   Troponin I     Status: None   Collection Time: 04/26/16 10:49 PM  Result Value Ref Range   Troponin I <0.03 <0.031 ng/mL     Comment:        NO INDICATION OF MYOCARDIAL INJURY.   Urinalysis, Routine w reflex microscopic     Status: None   Collection Time: 04/27/16 12:19 AM  Result Value Ref Range   Color, Urine YELLOW YELLOW   APPearance CLEAR CLEAR   Specific Gravity, Urine 1.010 1.005 - 1.030   pH 7.0 5.0 - 8.0   Glucose, UA NEGATIVE NEGATIVE mg/dL   Hgb urine dipstick NEGATIVE NEGATIVE   Bilirubin Urine NEGATIVE NEGATIVE   Ketones, ur NEGATIVE NEGATIVE mg/dL   Protein, ur NEGATIVE NEGATIVE mg/dL   Nitrite NEGATIVE NEGATIVE   Leukocytes, UA NEGATIVE NEGATIVE    Comment: MICROSCOPIC NOT DONE ON URINES WITH NEGATIVE PROTEIN, BLOOD, LEUKOCYTES, NITRITE, OR GLUCOSE <1000 mg/dL.  CBC     Status: Abnormal   Collection Time: 04/27/16  4:48 AM  Result Value Ref Range   WBC 8.4 4.0 - 10.5 K/uL   RBC 2.26 (L) 4.22 - 5.81 MIL/uL   Hemoglobin 7.1 (L) 13.0 - 17.0 g/dL   HCT 21.5 (L) 39.0 - 52.0 %   MCV 95.1 78.0 - 100.0 fL   MCH 31.4 26.0 - 34.0 pg   MCHC 33.0 30.0 - 36.0 g/dL   RDW 21.8 (H) 11.5 - 15.5 %   Platelets 383 150 - 400 K/uL  Basic metabolic panel     Status: Abnormal   Collection Time: 04/27/16  4:48 AM  Result Value Ref Range   Sodium 134 (L) 135 - 145 mmol/L   Potassium 4.3 3.5 - 5.1 mmol/L   Chloride 105 101 - 111 mmol/L   CO2 24 22 - 32 mmol/L   Glucose, Bld 103 (H) 65 - 99 mg/dL   BUN 17 6 - 20 mg/dL   Creatinine, Ser 0.97 0.61 - 1.24 mg/dL   Calcium 8.5 (L) 8.9 - 10.3 mg/dL   GFR calc non Af Amer >60 >60 mL/min   GFR calc Af Amer >60 >60 mL/min    Comment: (NOTE) The eGFR has been calculated using the CKD EPI equation. This calculation has not been validated in all clinical situations. eGFR's persistently <60 mL/min signify possible  Chronic Kidney Disease.    Anion gap 5 5 - 15  Troponin I     Status: None   Collection Time: 04/27/16  4:48 AM  Result Value Ref Range   Troponin I <0.03 <0.031 ng/mL    Comment:        NO INDICATION OF MYOCARDIAL INJURY.   Magnesium      Status: None   Collection Time: 04/27/16  4:48 AM  Result Value Ref Range   Magnesium 1.8 1.7 - 2.4 mg/dL  Type and screen [place]     Status: None (Preliminary result)   Collection Time: 04/27/16  7:18 AM  Result Value Ref Range   ABO/RH(D) A POS    Antibody Screen NEG    Sample Expiration 04/30/2016    Unit Number [id number]    Blood Component Type RED CELLS,LR    Unit division 00    Status of Unit ISSUED    Transfusion Status OK TO TRANSFUSE    Crossmatch Result Compatible    Unit Number [id number]    Blood Component Type RED CELLS,LR    Unit division 00    Status of Unit ISSUED    Transfusion Status OK TO TRANSFUSE    Crossmatch Result Compatible   ABO/Rh     Status: None   Collection Time: 04/27/16  7:18 AM  Result Value Ref Range   ABO/RH(D) A POS   Prepare RBC     Status: None   Collection Time: 04/27/16  8:51 AM  Result Value Ref Range   Order Confirmation ORDER PROCESSED BY BLOOD BANK   Troponin I     Status: None   Collection Time: 04/27/16 10:56 AM  Result Value Ref Range   Troponin I <0.03 <0.031 ng/mL    Comment:        NO INDICATION OF MYOCARDIAL INJURY.   Save smear     Status: None   Collection Time: 04/27/16 10:56 AM  Result Value Ref Range   Smear Review SMEAR STAINED AND AVAILABLE FOR REVIEW   CBC     Status: Abnormal   Collection Time: 04/27/16  6:43 PM  Result Value Ref Range   WBC 6.0 4.0 - 10.5 K/uL   RBC 3.02 (L) 4.22 - 5.81 MIL/uL   Hemoglobin 9.1 (L) 13.0 - 17.0 g/dL    Comment: REPEATED TO VERIFY POST TRANSFUSION SPECIMEN    HCT 27.5 (L) 39.0 - 52.0 %   MCV 91.1 78.0 - 100.0 fL   MCH 30.1 26.0 - 34.0 pg   MCHC 33.1 30.0 - 36.0 g/dL   RDW 22.1 (H) 11.5 - 15.5 %   Platelets 352 150 - 400 K/uL  CBC     Status: Abnormal   Collection Time: 04/28/16  2:41 AM  Result Value Ref Range   WBC 6.0 4.0 - 10.5 K/uL   RBC 2.90 (L) 4.22 - 5.81 MIL/uL   Hemoglobin 8.8 (L) 13.0 - 17.0 g/dL   HCT 27.0 (L) 39.0 - 52.0 %    MCV 93.1 78.0 - 100.0 fL   MCH 30.3 26.0 - 34.0 pg   MCHC 32.6 30.0 - 36.0 g/dL   RDW 22.9 (H) 11.5 - 15.5 %   Platelets 335 150 - 400 K/uL  Comprehensive metabolic panel     Status: Abnormal   Collection Time: 04/28/16  2:41 AM  Result Value Ref Range   Sodium 133 (L) 135 - 145 mmol/L   Potassium 4.5 3.5 - 5.1 mmol/L  Chloride 105 101 - 111 mmol/L   CO2 24 22 - 32 mmol/L   Glucose, Bld 94 65 - 99 mg/dL   BUN 20 6 - 20 mg/dL   Creatinine, Ser 1.08 0.61 - 1.24 mg/dL   Calcium 8.5 (L) 8.9 - 10.3 mg/dL   Total Protein 4.9 (L) 6.5 - 8.1 g/dL   Albumin 3.1 (L) 3.5 - 5.0 g/dL   AST 20 15 - 41 U/L   ALT 23 17 - 63 U/L   Alkaline Phosphatase 43 38 - 126 U/L   Total Bilirubin 1.4 (H) 0.3 - 1.2 mg/dL   GFR calc non Af Amer >60 >60 mL/min   GFR calc Af Amer >60 >60 mL/min    Comment: (NOTE) The eGFR has been calculated using the CKD EPI equation. This calculation has not been validated in all clinical situations. eGFR's persistently <60 mL/min signify possible Chronic Kidney Disease.    Anion gap 4 (L) 5 - 15      HPI : 82 y.o. male with medical history significant of CAD, HTN, HLD, BPH, anemia, renal insufficiency MDS followed by Dr. Marin Olp; who presents after becoming unresponsive while at his regularly scheduled oncology follow-up visit.Per report, the pt became unresponsive while sitting in a chair and was noted to be diaphoretic and ashen. During his time his heart rate was noted to decrease from the 60s to the low 20s, and remained in this range for 5 to 10 minutes.Patient appears to be bradycardic With baseline heart rate in the 50's.  Upon presentation to theER his pulse rate remained stable in the 50-60bpm range, but he was intermittently hypotensive with SBP=90s at times. He was subsequently transferred to [facility] for further evaluation and management.There is obvious concern for a primary bradycardic etiology, but unfortunately there are not rhythm strips or ECGs from  the time of his sustained bradycardia to review   HOSPITAL COURSE:   Syncope with bradycardia: Likely vasovagal,Resolved. no recurrent episodes of bradycardia, light-headedness, or dizziness Patient became unresponsive with heart rates into the 20s. Patient was noted to Have stool incontinence. No seizure-like activity seen per se, but on the differential includes symptomatic bradycardia versus seizure. Telemetry shows no Events, normal sinus rhythm. Avoid AV nodal blocking medications -cardiac enzymes negative Echocardiogram Results as above, EEG results  - Did not require Atropine   No obvious arrhythmias on telemetry during hospitalization. - Sinus bradycardia noted at times in the 40s at night. - Anemia, hypotension likely playing a role as well, even with heart rates in the 60s - At this point, no clear indication for pacemaker. Heart rate on ECG on 04/27/16 demonstrated sinus rhythm rate 62 bpm. No AV prolongation.   Myelodysplastic syndrome/ refractory anemia: Patient missed his scheduled Procrit injection due to his syncopal event. - type & screen for possible need of blood products -Discussed with Dr. Marin Olp , Hemoglobin has dropped to 7.1, he Recommended transfusion of 2 u PRBC Repeat hemoglobin 8.8 No signs of active bleeding  CAD - Continue aspirin,Discontinued isosorbide mononitrate   Essential hypertension - Hold Lisinopril, Cardura. Cardiology recommended to resume lisinopril, avoid isosorbide,We have reduced the dose of Cardura   Dementia: Stable. - Initially held patient's Namenda-donepezil combination pill secondary to question side effects of bradycardia associated with donepezil, Will hold off on resuming Aricept  Hyperlipidemia  - Continue simvastatin   Discharge Exam:   Blood pressure 179/66, pulse 52, temperature 98.2 F (36.8 C), temperature source Oral, resp. rate 14, height _0  (1.727  m), weight 65.227 kg (143 lb 12.8 oz), SpO2 97  %.  General exam: Appears calm and comfortable  Respiratory system: Clear to auscultation. Respiratory effort normal. Cardiovascular system: S1 & S2 heard, RRR. No JVD, murmurs, rubs, gallops or clicks. No pedal edema. Gastrointestinal system: Abdomen is nondistended, soft and nontender. No organomegaly or masses felt. Normal bowel sounds heard. Central nervous system: Alert and oriented. No focal neurological deficits. Extremities: Symmetric 5 x 5 power. Skin: No rashes, lesions or ulcers Psychiatry: Judgement and insight appear normal. Mood & affect appropriate.     Follow-up Information    Follow up with [name], MD. Schedule an appointment as soon as possible for a visit in 3 days.   Specialty:  Family Medicine   Why:  Hospital course   Contact information:   [place] [address] [phone]       Signed: Reyne Dumas 04/28/2016, 8:12 AM        Time spent >45 mins

## 2016-04-28 NOTE — Progress Notes (Signed)
Pt got discharged to home, discharge instructions provided and patient showed understanding to it, IV taken out,Telemonitor DC,pt left unit in wheelchair with all of the belongings accompanied with a family member (wife and daughter)

## 2016-05-01 ENCOUNTER — Telehealth: Payer: Self-pay | Admitting: General Practice

## 2016-05-01 NOTE — Telephone Encounter (Signed)
Isn't appointment at 11:30? Notes state 10:30 just want to make sure patient has right appt time

## 2016-05-01 NOTE — Telephone Encounter (Signed)
F/U is on 6/14 @ 11:30.

## 2016-05-01 NOTE — Telephone Encounter (Signed)
Transition Care Management Follow-up Telephone Call   Date discharged?04/28/2016   How have you been since you were released from the hospital? Unable to speak with patient.  Wife states that she has to prompt pt to do everything.   Do you understand why you were in the hospital? yes   Do you understand the discharge instructions? yes   Where were you discharged to? Home   Items Reviewed:  Medications reviewed: yes  Allergies reviewed: yes  Dietary changes reviewed: yes  Referrals reviewed: yes   Functional Questionnaire:   Activities of Daily Living (ADLs):   He states they are independent in the following:NO  States they require assistance with the following: Patient needs assistance with all ADLs.   Any transportation issues/concerns?: no   Any patient concerns? Yes.  Multiple concerns that will be addressed at appointment on 6/14.   Confirmed importance and date/time of follow-up visits scheduled yes  Provider Appointment booked with Dr. Yong Channel on 6/14 @ 10:30  Confirmed with patient if condition begins to worsen call PCP or go to the ER.  Patient was given the office number and encouraged to call back with question or concerns.  : yes

## 2016-05-02 ENCOUNTER — Encounter: Payer: Self-pay | Admitting: Family Medicine

## 2016-05-02 ENCOUNTER — Ambulatory Visit (INDEPENDENT_AMBULATORY_CARE_PROVIDER_SITE_OTHER): Payer: Medicare Other | Admitting: Family Medicine

## 2016-05-02 VITALS — BP 130/62 | HR 67 | Temp 98.1°F | Ht 68.0 in | Wt 136.0 lb

## 2016-05-02 DIAGNOSIS — Z5189 Encounter for other specified aftercare: Secondary | ICD-10-CM

## 2016-05-02 DIAGNOSIS — R4189 Other symptoms and signs involving cognitive functions and awareness: Secondary | ICD-10-CM

## 2016-05-02 DIAGNOSIS — R404 Transient alteration of awareness: Secondary | ICD-10-CM | POA: Diagnosis not present

## 2016-05-02 DIAGNOSIS — D469 Myelodysplastic syndrome, unspecified: Secondary | ICD-10-CM | POA: Diagnosis not present

## 2016-05-02 DIAGNOSIS — I25119 Atherosclerotic heart disease of native coronary artery with unspecified angina pectoris: Secondary | ICD-10-CM | POA: Diagnosis not present

## 2016-05-02 DIAGNOSIS — I10 Essential (primary) hypertension: Secondary | ICD-10-CM | POA: Diagnosis not present

## 2016-05-02 DIAGNOSIS — N400 Enlarged prostate without lower urinary tract symptoms: Secondary | ICD-10-CM

## 2016-05-02 LAB — BASIC METABOLIC PANEL
BUN: 25 mg/dL — AB (ref 6–23)
CO2: 26 mEq/L (ref 19–32)
CREATININE: 0.95 mg/dL (ref 0.40–1.50)
Calcium: 8.8 mg/dL (ref 8.4–10.5)
Chloride: 99 mEq/L (ref 96–112)
GFR: 80.75 mL/min (ref >60.00)
GLUCOSE: 90 mg/dL (ref 70–99)
POTASSIUM: 4.8 meq/L (ref 3.5–5.1)
Sodium: 131 mEq/L — ABNORMAL LOW (ref 135–145)

## 2016-05-02 LAB — CBC
HCT: 31.8 % — ABNORMAL LOW (ref 39.0–52.0)
HEMOGLOBIN: 10.5 g/dL — AB (ref 13.0–17.0)
MCHC: 33.1 g/dL (ref 30.0–36.0)
MCV: 94.8 fl (ref 78.0–100.0)
PLATELETS: 553 10*3/uL — AB (ref 150.0–400.0)
RBC: 3.36 Mil/uL — ABNORMAL LOW (ref 4.22–5.81)
RDW: 24.1 % — AB (ref 11.5–15.5)
WBC: 7.6 10*3/uL (ref 4.0–10.5)

## 2016-05-02 MED ORDER — LISINOPRIL 2.5 MG PO TABS: 2.5000 mg | ORAL_TABLET | Freq: Every day | ORAL | Status: DC

## 2016-05-02 NOTE — Assessment & Plan Note (Signed)
Transfused in the hospital with 2 units. We will update CBC and BMP

## 2016-05-02 NOTE — Progress Notes (Signed)
Pre visit review using our clinic review tool, if applicable. No additional management support is needed unless otherwise documented below in the visit note. 

## 2016-05-02 NOTE — Assessment & Plan Note (Signed)
cardura 8mg --> 4mg  due to orthostatic hypotension, Myrbetriq 50 mg

## 2016-05-02 NOTE — Assessment & Plan Note (Addendum)
Family denies the patient ever passed out. He was unresponsive and in this sparingly. EEG Was Normal. Echocardiogram primarily with grade 1 diastolic dysfunction primarily. Likely Multifactorial Due To Orthostatic Hypotension, Bradycardia, and anemia. No recurrence since discharge. Continue on lower levels of blood pressure medications. No stress test was performed and did not have prolonged monitoring of these would be considerations in the future if recurrent.

## 2016-05-02 NOTE — Progress Notes (Signed)
Subjective:  Alexander Rice is a 82 y.o. year old very pleasant male patient who presents for transitional care management and hospital follow up for unresponsiveness. Patient was hospitalized from 04/26/16 to 04/28/16. A TCM phone call was completed on 05/01/16 (10th and 11th were not business days). Medical complexity: high   Patient is an 82 year old male with a history of coronary artery disease, hypertension, hyperlipidemia, BPH, anemia, renal insufficiency, MDS. He was visiting his oncologist Dr. Marin Olp when per report the patient became unresponsive while sitting in a chair and was noted to be diaphoretic with pale color. His heart rate was noted to be down into the 20s after initially being in the 60s and remained in the 20s for 5-10 minutes. At baseline patient is bradycardic mainly in the 50s but time and that the 60s. Patient was transported emergently to the ER and his pulse rate returned to the 50-60 range without intervention. He was noted to be intermittently hypotensive throughout his hospitalization with systolics in the 0000000.  at times at home family notes that the patient has had a few episodes of seeming out of it for a few seconds and is yawning and snapping right back. This episode was more prolonged than anything they have ever seen. His workup included EKG which primarily showed bradycardia. He also had an echocardiogram which showed an EF of 60-65% and grade 1 diastolic dysfunction but no valvular abnormalities. His left atrium was severely dilated. He also had an EEG as he stooled himself during the unresponsive episode. EEG was normal and given he had no other obvious seizure-like activity it was thought low likelihood that this was seizure related. He has troponins cycled which were negative. He did develop worsening anemia down from 8.1 8.5 and 7.1 and he was transfused 2 units. Chest x-ray showed mild left basilar atelectasis.  It was thought that his unresponsive episode was  multifactorial due to anemia, bradycardia. It was thought to remain been vasovagal. Cardiology was consulted (Dr. Marlou Porch) and pacemaker was not planned as no indication noted and issues thought more multifactorial than primarily just bradycardia.[name] Heart monitor did show a rate of 40 that makes at times. Patient had no recurrence of symptoms in the hospital. Transfusion of 2 units was probably helpful. He did have his Imdur discontinued. Continued on aspirin. Initially his lisinopril and Cardura was held but they were later restarted. His Cardura dose was reduced in half. His lisinopril was planned for 5 mg if blood pressure above 150 and 2.5 mg of less than 150. Family has only been doing 5 mg at this time. They had noted previously incontinence on this regimen and he does have increased incontinence and urinary flow on the lisinopril. Patient's dementia was stable in the hospital on his Namenda-donepezil combination pill. Hyperlipidemia he was continued on simvastatin.  ROS- no fever, chills, nausea, vomiting. Does have urinary frequency and incontinence. No fecal incontinence. No headache or blurry vision. History largely pulled from daughter as level 5 caveat applied due to dementia. see any ROS included in HPI as well.   Past Medical History-  Patient Active Problem List   Diagnosis Date Noted  . Bradycardia 04/27/2016    Priority: High  . Unresponsiveness 04/27/2016    Priority: High  . Alzheimer's dementia 03/31/2015    Priority: High  . MDS (myelodysplastic syndrome) ([facility]) 05/14/2011    Priority: High  . CAD (coronary artery disease) 09/29/2006    Priority: High  . Hyperlipidemia 09/29/2006  Priority: Medium  . Essential hypertension 09/29/2006    Priority: Medium  . BPH (benign prostatic hyperplasia) 09/29/2006    Priority: Medium  . Spinal stenosis of lumbar region 03/31/2015    Priority: Low  . Pernicious anemia 03/11/2015    Priority: Low  . Glaucoma 12/01/2014    Priority:  Low  . Pulmonary nodule 04/08/2012    Priority: Low  . ANEMIA, B12 DEFICIENCY 07/03/2007    Priority: Low  . GERD 09/29/2006    Priority: Low    Medications- reviewed and updated Current Outpatient Prescriptions  Medication Sig Dispense Refill  . aspirin 81 MG tablet Take 81 mg by mouth daily.      . [facility] OMEGA-3 KRILL OIL PO Take 350 mg by mouth daily. Reported on 01/16/2016    . cyanocobalamin 100 MCG tablet Take by mouth daily.    . dorzolamide (TRUSOPT) 2 % ophthalmic solution Place 1 drop into both eyes 2 (two) times daily.      [name] doxazosin (CARDURA) 4 MG tablet Take 1 tablet (4 mg total) by mouth at bedtime. 30 tablet 1  . lisinopril (PRINIVIL,ZESTRIL) 5 MG tablet Take 1 tablet (5 mg total) by mouth daily. 30 tablet 1  . LUMIGAN 0.01 % SOLN Place 1 drop into both eyes at bedtime.     . memantine (NAMENDA) 5 MG tablet Take 1 tablet (5 mg total) by mouth 2 (two) times daily. 60 tablet 0  . mirabegron ER (MYRBETRIQ) 50 MG TB24 tablet Take 50 mg by mouth daily.    . Multiple Vitamin (MULTIVITAMIN) tablet Take 1 tablet by mouth daily.      [name] omeprazole (PRILOSEC) 20 MG capsule     . Probiotic Product (CVS PROBIOTIC) CHEW Chew by mouth daily.    . Pyridoxine HCl (VITAMIN B-6) 250 MG tablet Take 1 tablet (250 mg total) by mouth daily. 90 tablet 3  . simvastatin (ZOCOR) 40 MG tablet TAKE 1 TABLET BY MOUTH  DAILY AT 6 PM 90 tablet 2   No current facility-administered medications for this visit.   Facility-Administered Medications Ordered in Other Visits  Medication Dose Route Frequency Provider Last Rate Last Dose  . darbepoetin (ARANESP) injection 300 mcg  300 mcg Subcutaneous Once [name], MD        Objective: BP 130/62 mmHg  Pulse 67  Temp(Src) 98.1 F (36.7 C) (Oral)  Ht 5\' 8"  (1.727 m)  Wt 136 lb (61.689 kg)  BMI 20.68 kg/m2  SpO2 97% Gen: NAD, resting comfortably CV: RRR no murmurs rubs or gallops Lungs: CTAB no crackles, wheeze, rhonchi Abdomen:  soft/nontender/nondistended/normal bowel sounds.  Ext: no edema Skin: warm, dry Neuro: grossly normal, moves all extremities, Pleasantly confused  Assessment/Plan: restart pt/ot.   CAD (coronary artery disease) Blood pressure goal with coronary artery disease is less than [id number] systolic. Patient recent unresponsive episodes seem to be higher concern at this time such as if this were to happen while he is walking or showering. For this reason we will allow blood pressure goal of 150. He is not complaining of any chest pain so Imdur has been permanently discontinued.   Essential hypertension Imdur has been discontinued due to hypotension. Cardura has been reduced to 4 mg. Patient has been continued on lisinopril 5 mg. In the past patient and family have reported incontinence increase while on lisinopril which is an odd reaction. As discussed in CAD section we will increase blood pressure: 150 we will decrease lisinopril to 2.5 mg daily  to see if this can help with incontinence at all. I'm more concerned that the incontinence is due to the decreasing Cardura but the orthostatic potential on this medication makes me want to keep them medication at current levels  BPH (benign prostatic hyperplasia) cardura 8mg --> 4mg  due to orthostatic hypotension, Myrbetriq 50 mg  Unresponsiveness Family denies the patient ever passed out. He was unresponsive and in this sparingly. EEG Was Normal. Echocardiogram primarily with grade 1 diastolic dysfunction primarily. Likely Multifactorial Due To Orthostatic Hypotension, Bradycardia, and anemia. No recurrence since discharge. Continue on lower levels of blood pressure medications. No stress test was performed and did not have prolonged monitoring of these would be considerations in the future if recurrent.  MDS (myelodysplastic syndrome) (H[facility]) Transfused in the hospital with 2 units. We will update CBC and BMP   restart pt/ot.   Return in about 3 months (around  08/02/2016).  Orders Placed This Encounter  Procedures  . CBC    [place]  . Basic metabolic panel    [place]    Meds ordered this encounter  Medications  . lisinopril (PRINIVIL,ZESTRIL) 2.5 MG tablet    Sig: Take 1 tablet (2.5 mg total) by mouth daily.    Dispense:  90 tablet    Refill:  3    Return precautions advised.  [name], MD

## 2016-05-02 NOTE — Patient Instructions (Signed)
Labs before you leave  Reduce lisinopril to 2.5mg  with goal blood pressure <150. If occasionally into 140s or even sparingly 150s we will tolerate to try to offset the low blood pressure which are a bigger concern right now. You can update me on pressures.

## 2016-05-02 NOTE — Assessment & Plan Note (Signed)
Blood pressure goal with coronary artery disease is less than [id number] systolic. Patient recent unresponsive episodes seem to be higher concern at this time such as if this were to happen while he is walking or showering. For this reason we will allow blood pressure goal of 150. He is not complaining of any chest pain so Imdur has been permanently discontinued.

## 2016-05-02 NOTE — Assessment & Plan Note (Signed)
Imdur has been discontinued due to hypotension. Cardura has been reduced to 4 mg. Patient has been continued on lisinopril 5 mg. In the past patient and family have reported incontinence increase while on lisinopril which is an odd reaction. As discussed in CAD section we will increase blood pressure: 150 we will decrease lisinopril to 2.5 mg daily to see if this can help with incontinence at all. I'm more concerned that the incontinence is due to the decreasing Cardura but the orthostatic potential on this medication makes me want to keep them medication at current levels

## 2016-05-03 DIAGNOSIS — M4806 Spinal stenosis, lumbar region: Secondary | ICD-10-CM | POA: Diagnosis not present

## 2016-05-03 DIAGNOSIS — F0280 Dementia in other diseases classified elsewhere without behavioral disturbance: Secondary | ICD-10-CM | POA: Diagnosis not present

## 2016-05-03 DIAGNOSIS — R296 Repeated falls: Secondary | ICD-10-CM | POA: Diagnosis not present

## 2016-05-03 DIAGNOSIS — G309 Alzheimer's disease, unspecified: Secondary | ICD-10-CM | POA: Diagnosis not present

## 2016-05-03 DIAGNOSIS — D469 Myelodysplastic syndrome, unspecified: Secondary | ICD-10-CM | POA: Diagnosis not present

## 2016-05-03 DIAGNOSIS — R2689 Other abnormalities of gait and mobility: Secondary | ICD-10-CM | POA: Diagnosis not present

## 2016-05-04 DIAGNOSIS — G309 Alzheimer's disease, unspecified: Secondary | ICD-10-CM | POA: Diagnosis not present

## 2016-05-04 DIAGNOSIS — R2689 Other abnormalities of gait and mobility: Secondary | ICD-10-CM | POA: Diagnosis not present

## 2016-05-04 DIAGNOSIS — D469 Myelodysplastic syndrome, unspecified: Secondary | ICD-10-CM | POA: Diagnosis not present

## 2016-05-04 DIAGNOSIS — M4806 Spinal stenosis, lumbar region: Secondary | ICD-10-CM | POA: Diagnosis not present

## 2016-05-04 DIAGNOSIS — F0280 Dementia in other diseases classified elsewhere without behavioral disturbance: Secondary | ICD-10-CM | POA: Diagnosis not present

## 2016-05-04 DIAGNOSIS — R296 Repeated falls: Secondary | ICD-10-CM | POA: Diagnosis not present

## 2016-05-08 DIAGNOSIS — M4806 Spinal stenosis, lumbar region: Secondary | ICD-10-CM | POA: Diagnosis not present

## 2016-05-08 DIAGNOSIS — F0280 Dementia in other diseases classified elsewhere without behavioral disturbance: Secondary | ICD-10-CM | POA: Diagnosis not present

## 2016-05-08 DIAGNOSIS — R2689 Other abnormalities of gait and mobility: Secondary | ICD-10-CM | POA: Diagnosis not present

## 2016-05-08 DIAGNOSIS — R296 Repeated falls: Secondary | ICD-10-CM | POA: Diagnosis not present

## 2016-05-08 DIAGNOSIS — D469 Myelodysplastic syndrome, unspecified: Secondary | ICD-10-CM | POA: Diagnosis not present

## 2016-05-08 DIAGNOSIS — G309 Alzheimer's disease, unspecified: Secondary | ICD-10-CM | POA: Diagnosis not present

## 2016-05-09 DIAGNOSIS — R2689 Other abnormalities of gait and mobility: Secondary | ICD-10-CM | POA: Diagnosis not present

## 2016-05-09 DIAGNOSIS — F0280 Dementia in other diseases classified elsewhere without behavioral disturbance: Secondary | ICD-10-CM | POA: Diagnosis not present

## 2016-05-09 DIAGNOSIS — R296 Repeated falls: Secondary | ICD-10-CM | POA: Diagnosis not present

## 2016-05-09 DIAGNOSIS — M4806 Spinal stenosis, lumbar region: Secondary | ICD-10-CM | POA: Diagnosis not present

## 2016-05-09 DIAGNOSIS — D469 Myelodysplastic syndrome, unspecified: Secondary | ICD-10-CM | POA: Diagnosis not present

## 2016-05-09 DIAGNOSIS — G309 Alzheimer's disease, unspecified: Secondary | ICD-10-CM | POA: Diagnosis not present

## 2016-05-10 ENCOUNTER — Telehealth: Payer: Self-pay | Admitting: Family Medicine

## 2016-05-10 DIAGNOSIS — G309 Alzheimer's disease, unspecified: Secondary | ICD-10-CM | POA: Diagnosis not present

## 2016-05-10 DIAGNOSIS — R296 Repeated falls: Secondary | ICD-10-CM | POA: Diagnosis not present

## 2016-05-10 DIAGNOSIS — F0280 Dementia in other diseases classified elsewhere without behavioral disturbance: Secondary | ICD-10-CM | POA: Diagnosis not present

## 2016-05-10 DIAGNOSIS — R2689 Other abnormalities of gait and mobility: Secondary | ICD-10-CM | POA: Diagnosis not present

## 2016-05-10 DIAGNOSIS — D469 Myelodysplastic syndrome, unspecified: Secondary | ICD-10-CM | POA: Diagnosis not present

## 2016-05-10 DIAGNOSIS — M4806 Spinal stenosis, lumbar region: Secondary | ICD-10-CM | POA: Diagnosis not present

## 2016-05-10 NOTE — Telephone Encounter (Signed)
Verbal order called to [facility] for OT once a week x 1 week, then twice a week x 2 weeks.

## 2016-05-10 NOTE — Telephone Encounter (Signed)
Alexander Rice needs OT order for once a wk for 1 wk  Then twice a wk for 2 wk. Please call with verbal order

## 2016-05-11 ENCOUNTER — Telehealth: Payer: Self-pay | Admitting: Neurology

## 2016-05-11 ENCOUNTER — Other Ambulatory Visit: Payer: Self-pay

## 2016-05-11 ENCOUNTER — Telehealth: Payer: Self-pay | Admitting: Family Medicine

## 2016-05-11 DIAGNOSIS — R2689 Other abnormalities of gait and mobility: Secondary | ICD-10-CM | POA: Diagnosis not present

## 2016-05-11 DIAGNOSIS — M4806 Spinal stenosis, lumbar region: Secondary | ICD-10-CM | POA: Diagnosis not present

## 2016-05-11 DIAGNOSIS — R296 Repeated falls: Secondary | ICD-10-CM | POA: Diagnosis not present

## 2016-05-11 DIAGNOSIS — D469 Myelodysplastic syndrome, unspecified: Secondary | ICD-10-CM | POA: Diagnosis not present

## 2016-05-11 DIAGNOSIS — G309 Alzheimer's disease, unspecified: Secondary | ICD-10-CM | POA: Diagnosis not present

## 2016-05-11 DIAGNOSIS — F0280 Dementia in other diseases classified elsewhere without behavioral disturbance: Secondary | ICD-10-CM | POA: Diagnosis not present

## 2016-05-11 MED ORDER — MEMANTINE HCL 5 MG PO TABS: 5.0000 mg | ORAL_TABLET | Freq: Two times a day (BID) | ORAL | Status: DC

## 2016-05-11 NOTE — Telephone Encounter (Signed)
Discussed with Dr. Tomi Likens why Aricept was prescribed as Aricept 5 mg BID, instead of Aricept 10 mg QHS. He stated that there is no benefit in taking 5 mg BID vs 10 mg QHS. Attempted to call daughter just to verify that there was not some reasoning for the was it was prescribed before sending in refill. Message was left for her to return call to clinic.

## 2016-05-11 NOTE — Telephone Encounter (Signed)
Med was ordered on 04/28/16.

## 2016-05-11 NOTE — Telephone Encounter (Signed)
Spoke with patient. I had misread message. Wanted refill on his Namenda, as the Aricept had been stopped at the hospital, due to tachycardia. RX sent in to [facility].

## 2016-05-11 NOTE — Telephone Encounter (Signed)
Pt need new Rx for doxazosin 4 mg      Pharm:  [facility]

## 2016-05-11 NOTE — Telephone Encounter (Signed)
Alexander Rice 11/15/1932. His daughter Lyndel Pleasure and [place] called needing to get a prescription filled for him. Her number is [phone]. It is for Nomenda 5 mg 2 times a day. They needs it sent to [facility] . He had been in the hospital on 6/8 and they removed Amzarik (Arisept) due to heart problem. So it's only Nomenda. Thank you

## 2016-05-15 ENCOUNTER — Other Ambulatory Visit: Payer: Self-pay | Admitting: Neurology

## 2016-05-15 DIAGNOSIS — R2689 Other abnormalities of gait and mobility: Secondary | ICD-10-CM | POA: Diagnosis not present

## 2016-05-15 DIAGNOSIS — G309 Alzheimer's disease, unspecified: Secondary | ICD-10-CM | POA: Diagnosis not present

## 2016-05-15 DIAGNOSIS — F0280 Dementia in other diseases classified elsewhere without behavioral disturbance: Secondary | ICD-10-CM | POA: Diagnosis not present

## 2016-05-15 DIAGNOSIS — M4806 Spinal stenosis, lumbar region: Secondary | ICD-10-CM | POA: Diagnosis not present

## 2016-05-15 DIAGNOSIS — D469 Myelodysplastic syndrome, unspecified: Secondary | ICD-10-CM | POA: Diagnosis not present

## 2016-05-15 DIAGNOSIS — R296 Repeated falls: Secondary | ICD-10-CM | POA: Diagnosis not present

## 2016-05-15 NOTE — Telephone Encounter (Signed)
12/16/15 08/21/16 

## 2016-05-16 DIAGNOSIS — R2689 Other abnormalities of gait and mobility: Secondary | ICD-10-CM | POA: Diagnosis not present

## 2016-05-16 DIAGNOSIS — G309 Alzheimer's disease, unspecified: Secondary | ICD-10-CM | POA: Diagnosis not present

## 2016-05-16 DIAGNOSIS — R296 Repeated falls: Secondary | ICD-10-CM | POA: Diagnosis not present

## 2016-05-16 DIAGNOSIS — D469 Myelodysplastic syndrome, unspecified: Secondary | ICD-10-CM | POA: Diagnosis not present

## 2016-05-16 DIAGNOSIS — F0280 Dementia in other diseases classified elsewhere without behavioral disturbance: Secondary | ICD-10-CM | POA: Diagnosis not present

## 2016-05-16 DIAGNOSIS — M4806 Spinal stenosis, lumbar region: Secondary | ICD-10-CM | POA: Diagnosis not present

## 2016-05-17 DIAGNOSIS — R296 Repeated falls: Secondary | ICD-10-CM | POA: Diagnosis not present

## 2016-05-17 DIAGNOSIS — G309 Alzheimer's disease, unspecified: Secondary | ICD-10-CM | POA: Diagnosis not present

## 2016-05-17 DIAGNOSIS — R2689 Other abnormalities of gait and mobility: Secondary | ICD-10-CM | POA: Diagnosis not present

## 2016-05-17 DIAGNOSIS — D469 Myelodysplastic syndrome, unspecified: Secondary | ICD-10-CM | POA: Diagnosis not present

## 2016-05-17 DIAGNOSIS — M4806 Spinal stenosis, lumbar region: Secondary | ICD-10-CM | POA: Diagnosis not present

## 2016-05-17 DIAGNOSIS — F0280 Dementia in other diseases classified elsewhere without behavioral disturbance: Secondary | ICD-10-CM | POA: Diagnosis not present

## 2016-05-18 DIAGNOSIS — R296 Repeated falls: Secondary | ICD-10-CM | POA: Diagnosis not present

## 2016-05-18 DIAGNOSIS — F0280 Dementia in other diseases classified elsewhere without behavioral disturbance: Secondary | ICD-10-CM | POA: Diagnosis not present

## 2016-05-18 DIAGNOSIS — M4806 Spinal stenosis, lumbar region: Secondary | ICD-10-CM | POA: Diagnosis not present

## 2016-05-18 DIAGNOSIS — D469 Myelodysplastic syndrome, unspecified: Secondary | ICD-10-CM | POA: Diagnosis not present

## 2016-05-18 DIAGNOSIS — R2689 Other abnormalities of gait and mobility: Secondary | ICD-10-CM | POA: Diagnosis not present

## 2016-05-18 DIAGNOSIS — G309 Alzheimer's disease, unspecified: Secondary | ICD-10-CM | POA: Diagnosis not present

## 2016-05-21 ENCOUNTER — Encounter: Payer: Self-pay | Admitting: Family

## 2016-05-21 ENCOUNTER — Ambulatory Visit (HOSPITAL_BASED_OUTPATIENT_CLINIC_OR_DEPARTMENT_OTHER): Payer: Medicare Other | Admitting: Family

## 2016-05-21 ENCOUNTER — Other Ambulatory Visit: Payer: Self-pay

## 2016-05-21 ENCOUNTER — Other Ambulatory Visit (HOSPITAL_BASED_OUTPATIENT_CLINIC_OR_DEPARTMENT_OTHER): Payer: Medicare Other

## 2016-05-21 ENCOUNTER — Ambulatory Visit (HOSPITAL_BASED_OUTPATIENT_CLINIC_OR_DEPARTMENT_OTHER): Payer: Medicare Other

## 2016-05-21 VITALS — BP 135/49 | HR 66 | Temp 97.6°F | Resp 14 | Ht 68.0 in

## 2016-05-21 DIAGNOSIS — D510 Vitamin B12 deficiency anemia due to intrinsic factor deficiency: Secondary | ICD-10-CM

## 2016-05-21 DIAGNOSIS — M4806 Spinal stenosis, lumbar region: Secondary | ICD-10-CM | POA: Diagnosis not present

## 2016-05-21 DIAGNOSIS — D631 Anemia in chronic kidney disease: Secondary | ICD-10-CM

## 2016-05-21 DIAGNOSIS — D469 Myelodysplastic syndrome, unspecified: Secondary | ICD-10-CM

## 2016-05-21 DIAGNOSIS — D461 Refractory anemia with ring sideroblasts: Secondary | ICD-10-CM

## 2016-05-21 DIAGNOSIS — R001 Bradycardia, unspecified: Secondary | ICD-10-CM

## 2016-05-21 DIAGNOSIS — R296 Repeated falls: Secondary | ICD-10-CM | POA: Diagnosis not present

## 2016-05-21 DIAGNOSIS — D460 Refractory anemia without ring sideroblasts, so stated: Secondary | ICD-10-CM | POA: Diagnosis not present

## 2016-05-21 DIAGNOSIS — R2689 Other abnormalities of gait and mobility: Secondary | ICD-10-CM | POA: Diagnosis not present

## 2016-05-21 DIAGNOSIS — N189 Chronic kidney disease, unspecified: Secondary | ICD-10-CM | POA: Diagnosis not present

## 2016-05-21 DIAGNOSIS — G309 Alzheimer's disease, unspecified: Secondary | ICD-10-CM | POA: Diagnosis not present

## 2016-05-21 DIAGNOSIS — D509 Iron deficiency anemia, unspecified: Secondary | ICD-10-CM

## 2016-05-21 DIAGNOSIS — D518 Other vitamin B12 deficiency anemias: Secondary | ICD-10-CM

## 2016-05-21 DIAGNOSIS — F0280 Dementia in other diseases classified elsewhere without behavioral disturbance: Secondary | ICD-10-CM | POA: Diagnosis not present

## 2016-05-21 LAB — COMPREHENSIVE METABOLIC PANEL
ALBUMIN: 3.8 g/dL (ref 3.5–5.0)
ALK PHOS: 81 U/L (ref 40–150)
ALT: 39 U/L (ref 0–55)
AST: 21 U/L (ref 5–34)
Anion Gap: 7 mEq/L (ref 3–11)
BUN: 23.8 mg/dL (ref 7.0–26.0)
CHLORIDE: 102 meq/L (ref 98–109)
CO2: 25 mEq/L (ref 22–29)
Calcium: 9.1 mg/dL (ref 8.4–10.4)
Creatinine: 1 mg/dL (ref 0.7–1.3)
EGFR: 73 mL/min/{1.73_m2} — AB (ref >90)
GLUCOSE: 90 mg/dL (ref 70–140)
POTASSIUM: 4.7 meq/L (ref 3.5–5.1)
SODIUM: 134 meq/L — AB (ref 136–145)
Total Bilirubin: 0.71 mg/dL (ref 0.20–1.20)
Total Protein: 6.2 g/dL — ABNORMAL LOW (ref 6.4–8.3)

## 2016-05-21 LAB — CBC WITH DIFFERENTIAL (CANCER CENTER ONLY)
BASO#: 0 10*3/uL (ref 0.0–0.2)
BASO%: 0.2 % (ref 0.0–2.0)
EOS ABS: 0.2 10*3/uL (ref 0.0–0.5)
EOS%: 3.4 % (ref 0.0–7.0)
HEMATOCRIT: 29.3 % — AB (ref 38.7–49.9)
HGB: 9.8 g/dL — ABNORMAL LOW (ref 13.0–17.1)
LYMPH#: 1.7 10*3/uL (ref 0.9–3.3)
LYMPH%: 36.6 % (ref 14.0–48.0)
MCH: 32.2 pg (ref 28.0–33.4)
MCHC: 33.4 g/dL (ref 32.0–35.9)
MCV: 96 fL (ref 82–98)
MONO#: 0.4 10*3/uL (ref 0.1–0.9)
MONO%: 7.5 % (ref 0.0–13.0)
NEUT#: 2.4 10*3/uL (ref 1.5–6.5)
NEUT%: 52.3 % (ref 40.0–80.0)
Platelets: 443 10*3/uL — ABNORMAL HIGH (ref 145–400)
RBC: 3.04 10*6/uL — AB (ref 4.20–5.70)
RDW: 20.2 % — ABNORMAL HIGH (ref 11.1–15.7)
WBC: 4.6 10*3/uL (ref 4.0–10.0)

## 2016-05-21 MED ORDER — DOXAZOSIN MESYLATE 4 MG PO TABS: 4.0000 mg | ORAL_TABLET | Freq: Every day | ORAL | Status: DC

## 2016-05-21 MED ORDER — EPOETIN ALFA 40000 UNIT/ML IJ SOLN
40000.0000 [IU] | Freq: Once | INTRAMUSCULAR | Status: AC
  Administered 2016-05-21: 40000 [IU] via SUBCUTANEOUS

## 2016-05-21 MED ORDER — EPOETIN ALFA 40000 UNIT/ML IJ SOLN
INTRAMUSCULAR | Status: AC
  Filled 2016-05-21: qty 1

## 2016-05-21 NOTE — Progress Notes (Signed)
Hematology and Oncology Follow Up Visit  Alexander Rice [id number] 12-Dec-1931 82 y.o. 05/21/2016   Principle Diagnosis:  Anemia of renal insufficiency  Refractory anemia with ringed sideroblasts. Pernicious anemia  Current Therapy:   Procrit 40,000 units subcutaneous monthly Vitamin B6 250 mg by mouth daily    Interim History: Alexander Rice is here today with is wife and daughter for a follow-up. He is feeling much better and has not had anymore episodes of bradycardia or unresponsiveness. He was given 2 units of blood while in the hospital and his Hgb today 9.8. No episodes of bleeding or bruising.  He was taken off of IMDUR and Memantine HCl-Donepezil HCl.  No fever, chills, n/v, cough, rash, headache, dizziness, vision changes, SOB, chest pain, palpitations, abdominal pain or changes in bowel or bladder habits. He is incontinent of both stool and urine at times. This is not a new issue.  No swelling, tenderness, numbness or tingling in his extremities. No c/o joint aches or bone pain.  No lymphadenopathy found on exam. No recent falls or syncopal episodes.   He has maintained a good appetite and is staying hydrated. His weight is unchanged.   Medications:    Medication List       This list is accurate as of: 05/21/16 12:37 PM.  Always use your most recent med list.               aspirin 81 MG tablet  Take 81 mg by mouth daily.     CVS OMEGA-3 KRILL OIL PO  Take 350 mg by mouth daily. Reported on 01/16/2016     [facility] PROBIOTIC Chew  Chew by mouth daily.     cyanocobalamin 100 MCG tablet  Take by mouth daily.     dorzolamide 2 % ophthalmic solution  Commonly known as:  TRUSOPT  Place 1 drop into both eyes 2 (two) times daily.     doxazosin 4 MG tablet  Commonly known as:  CARDURA  Take 1 tablet (4 mg total) by mouth at bedtime.     lisinopril 2.5 MG tablet  Commonly known as:  PRINIVIL,ZESTRIL  Take 1 tablet (2.5 mg total) by mouth daily.     LUMIGAN 0.01 %  Soln  Generic drug:  bimatoprost  Place 1 drop into both eyes at bedtime.     memantine 5 MG tablet  Commonly known as:  NAMENDA  Take 1 tablet (5 mg total) by mouth 2 (two) times daily.     multivitamin tablet  Take 1 tablet by mouth daily.     MYRBETRIQ 50 MG Tb24 tablet  Generic drug:  mirabegron ER  Take 50 mg by mouth daily.     omeprazole 20 MG capsule  Commonly known as:  PRILOSEC     simvastatin 40 MG tablet  Commonly known as:  ZOCOR  TAKE 1 TABLET BY MOUTH  DAILY AT 6 PM     vitamin B-6 250 MG tablet  Take 1 tablet (250 mg total) by mouth daily.        Allergies: No Known Allergies  Past Medical History, Surgical history, Social history, and Family History were reviewed and updated.  Review of Systems: All other 10 point review of systems is negative.   Physical Exam:  height is 5\' 8"  (1.727 m). His oral temperature is 97.6 F (36.4 C). His blood pressure is 135/49 and his pulse is 66. His respiration is 14.   Wt Readings from Last 3 Encounters:  05/02/16  136 lb (61.689 kg)  04/28/16 143 lb 12.8 oz (65.227 kg)  04/26/16 145 lb (65.772 kg)    Ocular: Sclerae unicteric, pupils equal, round and reactive to light Ear-nose-throat: Oropharynx clear, dentition fair Lymphatic: No cervical supraclavicular or axillary adenopathy Lungs no rales or rhonchi, good excursion bilaterally Heart regular rate and rhythm, no murmur appreciated Abd soft, nontender, positive bowel sounds, no liver or spleen tip palpated on exam, no fluid wave  MSK no focal spinal tenderness, no joint edema Neuro: non-focal, well-oriented, appropriate affect Breast: Deferred   Lab Results  Component Value Date   WBC 4.6 05/21/2016   HGB 9.8* 05/21/2016   HCT 29.3* 05/21/2016   MCV 96 05/21/2016   PLT 443* 05/21/2016   Lab Results  Component Value Date   FERRITIN 779* 04/26/2016   IRON 102 04/26/2016   TIBC 184* 04/26/2016   UIBC 82* 04/26/2016   IRONPCTSAT 55 04/26/2016   Lab  Results  Component Value Date   RETICCTPCT 1.0 11/25/2015   RBC 3.04* 05/21/2016   RETICCTABS 27.2 11/25/2015   Lab Results  Component Value Date   KPAFRELGTCHN 3.09* 04/22/2008   LAMBDASER 1.01 04/22/2008   KAPLAMBRATIO 3.06* 04/22/2008   No results found for: Kandis Cocking, IGMSERUM Lab Results  Component Value Date   TOTALPROTELP 6.3 04/22/2008   ALBUMINELP 66.3* 04/22/2008   A1GS 3.9 04/22/2008   A2GS 8.1 04/22/2008   BETS 5.2 04/22/2008   BETA2SER 4.0 04/22/2008   GAMS 12.5 04/22/2008   MSPIKE NOT DET 04/22/2008   SPEI * 04/22/2008     Chemistry      Component Value Date/Time   NA 131* 05/02/2016 1219   NA 135 04/26/2016 1304   NA 131* 04/05/2016 1404   K 4.8 05/02/2016 1219   K 4.3 04/26/2016 1304   K 4.2 04/05/2016 1404   CL 99 05/02/2016 1219   CL 100 04/26/2016 1304   CO2 26 05/02/2016 1219   CO2 26 04/26/2016 1304   CO2 25 04/05/2016 1404   BUN 25* 05/02/2016 1219   BUN 19 04/26/2016 1304   BUN 18.4 04/05/2016 1404   CREATININE 0.95 05/02/2016 1219   CREATININE 1.1 04/26/2016 1304   CREATININE 1.0 04/05/2016 1404      Component Value Date/Time   CALCIUM 8.8 05/02/2016 1219   CALCIUM 8.7 04/26/2016 1304   CALCIUM 8.5 04/05/2016 1404   ALKPHOS 43 04/28/2016 0241   ALKPHOS 40 04/26/2016 1304   ALKPHOS 55 04/05/2016 1404   AST 20 04/28/2016 0241   AST 21 04/26/2016 1304   AST 16 04/05/2016 1404   ALT 23 04/28/2016 0241   ALT 21 04/26/2016 1304   ALT 15 04/05/2016 1404   BILITOT 1.4* 04/28/2016 0241   BILITOT 0.90 04/26/2016 1304   BILITOT 0.63 04/05/2016 1404     Impression and Plan: Alexander Rice is an 82 yo white male with myelodysplasia and refractory anemia with ringed sideroblasts. Since receiving blood in the hospital and stopping 2 of his medications he has not had any more episodes of bradycardia or unresponsiveness. He is much more alert today and has no complaints at this time.  His Hgb is 9.8. We will give him his dose of Procrit today  as planned per Dr. Marin Olp.  We will plan to see him back in 3 weeks for repeat lab work and follow-up.  Both his wife and daughter know to contact us with any questions or concerns. We can certainly see him sooner if need be.  Eliezer Bottom, NP 7/3/201712:37 PM

## 2016-05-21 NOTE — Patient Instructions (Signed)
Epoetin Alfa injection What is this medicine? EPOETIN ALFA (e POE e tin AL fa) helps your body make more red blood cells. This medicine is used to treat anemia caused by chronic kidney failure, cancer chemotherapy, or HIV-therapy. It may also be used before surgery if you have anemia. This medicine may be used for other purposes; ask your health care provider or pharmacist if you have questions. What should I tell my health care provider before I take this medicine? They need to know if you have any of these conditions: -blood clotting disorders -cancer patient not on chemotherapy -cystic fibrosis -heart disease, such as angina or heart failure -hemoglobin level of 12 g/dL or greater -high blood pressure -low levels of folate, iron, or vitamin B12 -seizures -an unusual or allergic reaction to erythropoietin, albumin, benzyl alcohol, hamster proteins, other medicines, foods, dyes, or preservatives -pregnant or trying to get pregnant -breast-feeding How should I use this medicine? This medicine is for injection into a vein or under the skin. It is usually given by a health care professional in a hospital or clinic setting. If you get this medicine at home, you will be taught how to prepare and give this medicine. Use exactly as directed. Take your medicine at regular intervals. Do not take your medicine more often than directed. It is important that you put your used needles and syringes in a special sharps container. Do not put them in a trash can. If you do not have a sharps container, call your pharmacist or healthcare provider to get one. Talk to your pediatrician regarding the use of this medicine in children. While this drug may be prescribed for selected conditions, precautions do apply. Overdosage: If you think you have taken too much of this medicine contact a poison control center or emergency room at once. NOTE: This medicine is only for you. Do not share this medicine with  others. What if I miss a dose? If you miss a dose, take it as soon as you can. If it is almost time for your next dose, take only that dose. Do not take double or extra doses. What may interact with this medicine? Do not take this medicine with any of the following medications: -darbepoetin alfa This list may not describe all possible interactions. Give your health care provider a list of all the medicines, herbs, non-prescription drugs, or dietary supplements you use. Also tell them if you smoke, drink alcohol, or use illegal drugs. Some items may interact with your medicine. What should I watch for while using this medicine? Visit your prescriber or health care professional for regular checks on your progress and for the needed blood tests and blood pressure measurements. It is especially important for the doctor to make sure your hemoglobin level is in the desired range, to limit the risk of potential side effects and to give you the best benefit. Keep all appointments for any recommended tests. Check your blood pressure as directed. Ask your doctor what your blood pressure should be and when you should contact him or her. As your body makes more red blood cells, you may need to take iron, folic acid, or vitamin B supplements. Ask your doctor or health care provider which products are right for you. If you have kidney disease continue dietary restrictions, even though this medication can make you feel better. Talk with your doctor or health care professional about the foods you eat and the vitamins that you take. What side effects may I notice   from receiving this medicine? Side effects that you should report to your doctor or health care professional as soon as possible: -allergic reactions like skin rash, itching or hives, swelling of the face, lips, or tongue -breathing problems -changes in vision -chest pain -confusion, trouble speaking or understanding -feeling faint or lightheaded,  falls -high blood pressure -muscle aches or pains -pain, swelling, warmth in the leg -rapid weight gain -severe headaches -sudden numbness or weakness of the face, arm or leg -trouble walking, dizziness, loss of balance or coordination -seizures (convulsions) -swelling of the ankles, feet, hands -unusually weak or tired Side effects that usually do not require medical attention (report to your doctor or health care professional if they continue or are bothersome): -diarrhea -fever, chills (flu-like symptoms) -headaches -nausea, vomiting -redness, stinging, or swelling at site where injected This list may not describe all possible side effects. Call your doctor for medical advice about side effects. You may report side effects to FDA at 1-800-FDA-1088. Where should I keep my medicine? Keep out of the reach of children. Store in a refrigerator between 2 and 8 degrees C (36 and 46 degrees F). Do not freeze or shake. Throw away any unused portion if using a single-dose vial. Multi-dose vials can be kept in the refrigerator for up to 21 days after the initial dose. Throw away unused medicine. NOTE: This sheet is a summary. It may not cover all possible information. If you have questions about this medicine, talk to your doctor, pharmacist, or health care provider.    2016, [employer] Standard. (2008-10-19 10:25:44)  

## 2016-05-22 DIAGNOSIS — R296 Repeated falls: Secondary | ICD-10-CM | POA: Diagnosis not present

## 2016-05-22 DIAGNOSIS — G309 Alzheimer's disease, unspecified: Secondary | ICD-10-CM | POA: Diagnosis not present

## 2016-05-22 DIAGNOSIS — F0280 Dementia in other diseases classified elsewhere without behavioral disturbance: Secondary | ICD-10-CM | POA: Diagnosis not present

## 2016-05-22 DIAGNOSIS — M4806 Spinal stenosis, lumbar region: Secondary | ICD-10-CM | POA: Diagnosis not present

## 2016-05-22 DIAGNOSIS — D469 Myelodysplastic syndrome, unspecified: Secondary | ICD-10-CM | POA: Diagnosis not present

## 2016-05-22 DIAGNOSIS — R2689 Other abnormalities of gait and mobility: Secondary | ICD-10-CM | POA: Diagnosis not present

## 2016-05-22 LAB — RETICULOCYTES: RETICULOCYTE COUNT: 0.5 % — AB (ref 0.6–2.6)

## 2016-05-23 DIAGNOSIS — F0280 Dementia in other diseases classified elsewhere without behavioral disturbance: Secondary | ICD-10-CM | POA: Diagnosis not present

## 2016-05-23 DIAGNOSIS — G309 Alzheimer's disease, unspecified: Secondary | ICD-10-CM | POA: Diagnosis not present

## 2016-05-23 DIAGNOSIS — R296 Repeated falls: Secondary | ICD-10-CM | POA: Diagnosis not present

## 2016-05-23 DIAGNOSIS — M4806 Spinal stenosis, lumbar region: Secondary | ICD-10-CM | POA: Diagnosis not present

## 2016-05-23 DIAGNOSIS — R2689 Other abnormalities of gait and mobility: Secondary | ICD-10-CM | POA: Diagnosis not present

## 2016-05-23 DIAGNOSIS — D469 Myelodysplastic syndrome, unspecified: Secondary | ICD-10-CM | POA: Diagnosis not present

## 2016-05-23 LAB — IRON AND TIBC
%SAT: 97 % — AB (ref 20–55)
IRON: 182 ug/dL — AB (ref 42–163)
TIBC: 188 ug/dL — ABNORMAL LOW (ref 202–409)
UIBC: 6 ug/dL — AB (ref 117–376)

## 2016-05-23 LAB — FERRITIN

## 2016-05-24 DIAGNOSIS — R2689 Other abnormalities of gait and mobility: Secondary | ICD-10-CM | POA: Diagnosis not present

## 2016-05-24 DIAGNOSIS — M4806 Spinal stenosis, lumbar region: Secondary | ICD-10-CM | POA: Diagnosis not present

## 2016-05-24 DIAGNOSIS — G309 Alzheimer's disease, unspecified: Secondary | ICD-10-CM | POA: Diagnosis not present

## 2016-05-24 DIAGNOSIS — D469 Myelodysplastic syndrome, unspecified: Secondary | ICD-10-CM | POA: Diagnosis not present

## 2016-05-24 DIAGNOSIS — F0280 Dementia in other diseases classified elsewhere without behavioral disturbance: Secondary | ICD-10-CM | POA: Diagnosis not present

## 2016-05-24 DIAGNOSIS — R296 Repeated falls: Secondary | ICD-10-CM | POA: Diagnosis not present

## 2016-05-29 ENCOUNTER — Ambulatory Visit (INDEPENDENT_AMBULATORY_CARE_PROVIDER_SITE_OTHER): Payer: Medicare Other | Admitting: Internal Medicine

## 2016-05-29 ENCOUNTER — Ambulatory Visit (INDEPENDENT_AMBULATORY_CARE_PROVIDER_SITE_OTHER)
Admission: RE | Admit: 2016-05-29 | Discharge: 2016-05-29 | Disposition: A | Discharge status: Home / Self Care | Payer: Medicare Other | Source: Ambulatory Visit | Attending: Internal Medicine | Admitting: Internal Medicine

## 2016-05-29 ENCOUNTER — Encounter: Payer: Self-pay | Admitting: Internal Medicine

## 2016-05-29 VITALS — BP 112/66 | HR 78 | Wt 148.0 lb

## 2016-05-29 DIAGNOSIS — I25119 Atherosclerotic heart disease of native coronary artery with unspecified angina pectoris: Secondary | ICD-10-CM

## 2016-05-29 DIAGNOSIS — R911 Solitary pulmonary nodule: Secondary | ICD-10-CM | POA: Diagnosis not present

## 2016-05-29 NOTE — Patient Instructions (Signed)
Please remember to go to the   x-ray department downstairs for your tests - we will call you with the results when they are available.     Return in one year if breathing on walking breezway is not letting him do this without stopping or new persistent cough or pain on breathing  - cxr on return

## 2016-05-29 NOTE — Progress Notes (Signed)
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  assoc with mild constipation and some relief p bm. No pain with cough, no sob. rec The nodule on your lung is very small and probably cannot be seen on a plain cxr so the best way to follow this up in low risk situation like yours is to get a ct chest in May 2014 (we have you in our file for recall).  CT chest 04/10/14 Stable tiny bilateral pulmonary nodules, largest measuring 6 mm. Continued followup by CT recommended again in 12 months.    04/20/2015 f/u ov/Cayci Mcnabb re:  MPNs/ asbestos exp Chief Complaint  Patient presents with  . Follow-up    feels breathing is doing well w/no concerns at this time.  rec F/u yearly cxr   05/29/2016  f/u ov/Shakari Qazi re: mpns/ asbestos exp  Chief Complaint  Patient presents with  . Pulmonary Nodule    Breathing is doing well per the pt. No new complaints.   Walks bree[place]ay s stopping slow pace no sob   No obvious day to day or daytime variabilty or assoc chronic cough or cp or chest tightness, subjective wheeze overt sinus or hb symptoms. No unusual exp hx or h/o childhood pna/ asthma or knowledge of premature birth.  Sleeping ok without nocturnal  or early am exacerbation  of respiratory  c/o's or need for noct saba. Also denies any obvious fluctuation of symptoms with weather or environmental changes or other aggravating or alleviating factors except as outlined above   Current Medications, Allergies, Complete Past Medical History, Past Surgical  History, Family History, and Social History were reviewed in [employer] record.  ROS  The following are not active complaints unless bolded sore throat, dysphagia, dental problems, itching, sneezing,  nasal congestion or excess/ purulent secretions, ear ache,   fever, chills, sweats, unintended wt loss, pleuritic or exertional cp, hemoptysis,  orthopnea pnd or leg swelling, presyncope, palpitations, heartburn, abdominal pain, anorexia, nausea, vomiting, diarrhea  or change in bowel or urinary habits, change in stools or urine, dysuria,hematuria,  rash, arthralgias, visual complaints, headache, numbness weakness or ataxia or problems with walking or coordination,  change in mood/affect or memory slowly deteriorating  per wife despite meds           Objective:   Physical Exam  amb pleasant frail elderly  wm nad  severe kyphotic deformity/ one person assist to stand and get on table   04/20/2015         151  >  05/29/2016   148            04/08/12 167 lb (75.751 kg)  03/24/12 168 lb (76.204 kg)  02/19/12 169 lb (76.658 kg)    HEENT: nl dentition, turbinates, and orophanx. Nl external ear canals without cough reflex   NECK :  without JVD/Nodes/TM/ nl carotid upstrokes bilaterally   LUNGS: no acc muscle use, clear to A and P bilaterally without cough on insp  or exp maneuvers   CV:  RRR  no s3 or murmur or increase in P2, no edema   ABD:  soft and nontender with nl excursion in the supine position. No bruits or organomegaly, bowel sounds nl  MS:  warm without deformities, calf tenderness, cyanosis or clubbing  SKIN: warm and dry without lesions    NEURO:  alert, approp, no deficits       CXR PA and Lateral:   05/29/2016 :    I personally reviewed images and agree with radiology impression as follows:   1. No acute cardiopulmonary abnormalities. 2. Aortic atherosclerosis and tortuosity. 3. Previously noted pulmonary nodule from CT dated 03/31/2014 would be  too small to reasonably follow via plain film radiographs an a CT of the chest would be recommended.      Assessment:

## 2016-05-30 ENCOUNTER — Telehealth: Payer: Self-pay | Admitting: Internal Medicine

## 2016-05-30 DIAGNOSIS — H401111 Primary open-angle glaucoma, right eye, mild stage: Secondary | ICD-10-CM | POA: Diagnosis not present

## 2016-05-30 DIAGNOSIS — H401121 Primary open-angle glaucoma, left eye, mild stage: Secondary | ICD-10-CM | POA: Diagnosis not present

## 2016-05-30 DIAGNOSIS — H2513 Age-related nuclear cataract, bilateral: Secondary | ICD-10-CM | POA: Diagnosis not present

## 2016-05-30 DIAGNOSIS — H35363 Drusen (degenerative) of macula, bilateral: Secondary | ICD-10-CM | POA: Diagnosis not present

## 2016-05-30 NOTE — Progress Notes (Signed)
Quick Note:  LMTCB ______ 

## 2016-05-30 NOTE — Telephone Encounter (Signed)
Result Note     Call pt: Reviewed cxr and no acute change so no change in recommendations made at ov  --  Spoke with pt daughter and is aware of results. She verbalized understanding and needed nothing further

## 2016-06-03 NOTE — Assessment & Plan Note (Signed)
-   first noted May 2013 with min smoking hx but Pos asbetos exp - nl cxr 04/20/15  -  cxr 05/29/2016 wnl   Now 4 years since detected by CT scanning in a pt who is clearly not a candidate for aggressive rx   Discussed in detail all the  indications, usual  risks and alternatives  relative to the benefits with patient who agrees to proceed with conservative f/u as outlined    I had an extended discussion with the patient reviewing all relevant studies completed to date and  lasting 15 to 20 minutes of a 25 minute visit    Each maintenance medication was reviewed in detail including most importantly the difference between maintenance and prns and under what circumstances the prns are to be triggered using an action plan format that is not reflected in the computer generated alphabetically organized AVS.    Please see instructions for details which were reviewed in writing and the patient given a copy highlighting the part that I personally wrote and discussed at today's ov.

## 2016-06-11 ENCOUNTER — Other Ambulatory Visit (HOSPITAL_BASED_OUTPATIENT_CLINIC_OR_DEPARTMENT_OTHER): Payer: Medicare Other

## 2016-06-11 ENCOUNTER — Ambulatory Visit (HOSPITAL_BASED_OUTPATIENT_CLINIC_OR_DEPARTMENT_OTHER): Payer: Medicare Other | Admitting: Family

## 2016-06-11 ENCOUNTER — Other Ambulatory Visit: Payer: Self-pay | Admitting: Family

## 2016-06-11 ENCOUNTER — Ambulatory Visit (HOSPITAL_BASED_OUTPATIENT_CLINIC_OR_DEPARTMENT_OTHER): Payer: Medicare Other

## 2016-06-11 ENCOUNTER — Encounter: Payer: Self-pay | Admitting: Family

## 2016-06-11 ENCOUNTER — Ambulatory Visit: Payer: Medicare Other | Admitting: Neurology

## 2016-06-11 VITALS — BP 156/60 | HR 77 | Temp 97.4°F | Resp 18 | Ht 68.0 in | Wt 151.0 lb

## 2016-06-11 DIAGNOSIS — D469 Myelodysplastic syndrome, unspecified: Secondary | ICD-10-CM

## 2016-06-11 DIAGNOSIS — N189 Chronic kidney disease, unspecified: Secondary | ICD-10-CM | POA: Diagnosis not present

## 2016-06-11 DIAGNOSIS — R001 Bradycardia, unspecified: Secondary | ICD-10-CM | POA: Diagnosis not present

## 2016-06-11 DIAGNOSIS — D509 Iron deficiency anemia, unspecified: Secondary | ICD-10-CM

## 2016-06-11 DIAGNOSIS — D510 Vitamin B12 deficiency anemia due to intrinsic factor deficiency: Secondary | ICD-10-CM

## 2016-06-11 DIAGNOSIS — D461 Refractory anemia with ring sideroblasts: Secondary | ICD-10-CM

## 2016-06-11 DIAGNOSIS — D638 Anemia in other chronic diseases classified elsewhere: Secondary | ICD-10-CM

## 2016-06-11 DIAGNOSIS — D631 Anemia in chronic kidney disease: Secondary | ICD-10-CM | POA: Diagnosis not present

## 2016-06-11 DIAGNOSIS — D460 Refractory anemia without ring sideroblasts, so stated: Secondary | ICD-10-CM

## 2016-06-11 LAB — CBC WITH DIFFERENTIAL (CANCER CENTER ONLY)
BASO#: 0 10*3/uL (ref 0.0–0.2)
BASO%: 0.2 % (ref 0.0–2.0)
EOS%: 5.3 % (ref 0.0–7.0)
Eosinophils Absolute: 0.2 10*3/uL (ref 0.0–0.5)
HEMATOCRIT: 25.4 % — AB (ref 38.7–49.9)
HEMOGLOBIN: 8.7 g/dL — AB (ref 13.0–17.1)
LYMPH#: 1.5 10*3/uL (ref 0.9–3.3)
LYMPH%: 36.2 % (ref 14.0–48.0)
MCH: 32.8 pg (ref 28.0–33.4)
MCHC: 34.3 g/dL (ref 32.0–35.9)
MCV: 96 fL (ref 82–98)
MONO#: 0.3 10*3/uL (ref 0.1–0.9)
MONO%: 7 % (ref 0.0–13.0)
NEUT%: 51.3 % (ref 40.0–80.0)
NEUTROS ABS: 2.1 10*3/uL (ref 1.5–6.5)
Platelets: 466 10*3/uL — ABNORMAL HIGH (ref 145–400)
RBC: 2.65 10*6/uL — AB (ref 4.20–5.70)
RDW: 20.2 % — ABNORMAL HIGH (ref 11.1–15.7)
WBC: 4.1 10*3/uL (ref 4.0–10.0)

## 2016-06-11 LAB — COMPREHENSIVE METABOLIC PANEL
ALBUMIN: 3.6 g/dL (ref 3.5–5.0)
ALK PHOS: 52 U/L (ref 40–150)
ALT: 20 U/L (ref 0–55)
ANION GAP: 6 meq/L (ref 3–11)
AST: 18 U/L (ref 5–34)
BILIRUBIN TOTAL: 0.83 mg/dL (ref 0.20–1.20)
BUN: 21.8 mg/dL (ref 7.0–26.0)
CO2: 25 meq/L (ref 22–29)
CREATININE: 0.9 mg/dL (ref 0.7–1.3)
Calcium: 8.6 mg/dL (ref 8.4–10.4)
Chloride: 101 mEq/L (ref 98–109)
EGFR: 77 mL/min/{1.73_m2} — AB (ref >90)
Glucose: 90 mg/dl (ref 70–140)
Potassium: 4.4 mEq/L (ref 3.5–5.1)
Sodium: 132 mEq/L — ABNORMAL LOW (ref 136–145)
TOTAL PROTEIN: 5.8 g/dL — AB (ref 6.4–8.3)

## 2016-06-11 LAB — IRON AND TIBC
%SAT: 99 % — AB (ref 20–55)
IRON: 177 ug/dL — AB (ref 42–163)
TIBC: 178 ug/dL — AB (ref 202–409)

## 2016-06-11 LAB — FERRITIN: Ferritin: 1021 ng/ml — ABNORMAL HIGH (ref 22–316)

## 2016-06-11 MED ORDER — EPOETIN ALFA 40000 UNIT/ML IJ SOLN
INTRAMUSCULAR | Status: AC
  Filled 2016-06-11: qty 1

## 2016-06-11 MED ORDER — EPOETIN ALFA 40000 UNIT/ML IJ SOLN
40000.0000 [IU] | Freq: Once | INTRAMUSCULAR | Status: AC
  Administered 2016-06-11: 40000 [IU] via SUBCUTANEOUS

## 2016-06-11 NOTE — Progress Notes (Signed)
Hematology and Oncology Follow Up Visit  Alexander Rice [id number] 01-26-1932 82 y.o. 06/11/2016   Principle Diagnosis:  Anemia of renal insufficiency  Refractory anemia with ringed sideroblasts. Pernicious anemia  Current Therapy:   Procrit 40,000 units subcutaneous monthly Vitamin B6 250 mg by mouth daily    Interim History: Alexander Rice is here today with is wife and daughter for a follow-up. He continues to do fairly well. He has had some fatigue and SOB with exertion which is not a new issue. He has had no more syncopal episodes and is not in distress at this time.  No fever, chills, n/v, cough, rash, headache, dizziness, vision changes, chest pain, palpitations, abdominal pain or changes in bowel or bladder habits. He continues to be incontinent of both stool and urine at times.  No swelling, tenderness, numbness or tingling in his extremities. No c/o joint aches or bone pain.  No lymphadenopathy found on exam. No falls or syncopal episodes.   He is eating well and staying hydrated. His weight is unchanged.   Medications:    Medication List       Accurate as of 06/11/16 11:59 AM. Always use your most recent med list.          aspirin 81 MG tablet Take 81 mg by mouth daily.   [facility] OMEGA-3 KRILL OIL PO Take 350 mg by mouth daily. Reported on 01/16/2016   [facility] PROBIOTIC Chew Chew by mouth daily.   cyanocobalamin 100 MCG tablet Take by mouth daily.   dorzolamide 2 % ophthalmic solution Commonly known as:  TRUSOPT Place 1 drop into both eyes 2 (two) times daily.   doxazosin 4 MG tablet Commonly known as:  CARDURA Take 1 tablet (4 mg total) by mouth at bedtime.   lisinopril 2.5 MG tablet Commonly known as:  PRINIVIL,ZESTRIL Take 1 tablet (2.5 mg total) by mouth daily.   LUMIGAN 0.01 % Soln Generic drug:  bimatoprost Place 1 drop into both eyes at bedtime.   memantine 5 MG tablet Commonly known as:  NAMENDA Take 1 tablet (5 mg total) by mouth 2 (two)  times daily.   multivitamin tablet Take 1 tablet by mouth daily.   MYRBETRIQ 50 MG Tb24 tablet Generic drug:  mirabegron ER Take 50 mg by mouth daily.   omeprazole 20 MG capsule Commonly known as:  PRILOSEC   simvastatin 40 MG tablet Commonly known as:  ZOCOR TAKE 1 TABLET BY MOUTH  DAILY AT 6 PM   vitamin B-6 250 MG tablet Take 1 tablet (250 mg total) by mouth daily.       Allergies: No Known Allergies  Past Medical History, Surgical history, Social history, and Family History were reviewed and updated.  Review of Systems: All other 10 point review of systems is negative.   Physical Exam:  height is 5\' 8"  (1.727 m) and weight is 151 lb (68.5 kg). His oral temperature is 97.4 F (36.3 C). His blood pressure is 156/60 (abnormal) and his pulse is 77. His respiration is 18.   Wt Readings from Last 3 Encounters:  06/11/16 151 lb (68.5 kg)  05/29/16 148 lb (67.1 kg)  05/02/16 136 lb (61.7 kg)    Ocular: Sclerae unicteric, pupils equal, round and reactive to light Ear-nose-throat: Oropharynx clear, dentition fair Lymphatic: No cervical supraclavicular or axillary adenopathy Lungs no rales or rhonchi, good excursion bilaterally Heart regular rate and rhythm, no murmur appreciated Abd soft, nontender, positive bowel sounds, no liver or spleen tip palpated on  exam, no fluid wave  MSK no focal spinal tenderness, no joint edema Neuro: non-focal, well-oriented, appropriate affect Breast: Deferred   Lab Results  Component Value Date   WBC 4.1 06/11/2016   HGB 8.7 (L) 06/11/2016   HCT 25.4 (L) 06/11/2016   MCV 96 06/11/2016   PLT 466 (H) 06/11/2016   Lab Results  Component Value Date   FERRITIN 1,332 (H) 05/21/2016   IRON 182 (H) 05/21/2016   TIBC 188 (L) 05/21/2016   UIBC 6 (L) 05/21/2016   IRONPCTSAT 97 (H) 05/21/2016   Lab Results  Component Value Date   RETICCTPCT 1.0 11/25/2015   RBC 2.65 (L) 06/11/2016   RETICCTABS 27.2 11/25/2015   Lab Results    Component Value Date   KPAFRELGTCHN 3.09 (H) 04/22/2008   LAMBDASER 1.01 04/22/2008   KAPLAMBRATIO 3.06 (H) 04/22/2008   No results found for: Osborne Casco Lab Results  Component Value Date   TOTALPROTELP 6.3 04/22/2008   ALBUMINELP 66.3 (H) 04/22/2008   A1GS 3.9 04/22/2008   A2GS 8.1 04/22/2008   BETS 5.2 04/22/2008   BETA2SER 4.0 04/22/2008   GAMS 12.5 04/22/2008   MSPIKE NOT DET 04/22/2008   SPEI * 04/22/2008     Chemistry      Component Value Date/Time   NA 134 (L) 05/21/2016 1155   K 4.7 05/21/2016 1155   CL 99 05/02/2016 1219   CL 100 04/26/2016 1304   CO2 25 05/21/2016 1155   BUN 23.8 05/21/2016 1155   CREATININE 1.0 05/21/2016 1155      Component Value Date/Time   CALCIUM 9.1 05/21/2016 1155   ALKPHOS 81 05/21/2016 1155   AST 21 05/21/2016 1155   ALT 39 05/21/2016 1155   BILITOT 0.71 05/21/2016 1155     Impression and Plan: Alexander Rice is an 82 yo white male with myelodysplasia and refractory anemia with ringed sideroblasts. He has some fatigue and SOB with exertion. This comes and goes. No syncope. No bleeding or bruising.   His Hgb is 8.7 with an MCV of 96. We will give him Procrit today.  No iron deficiency anemia at this  We will plan to see him back again in 3 weeks for repeat lab work and follow-up.  Both his wife and daughter know to contact us with any questions or concerns. We can certainly see him sooner if need be.   Eliezer Bottom, NP 7/24/201711:59 AM

## 2016-06-11 NOTE — Patient Instructions (Signed)
Darbepoetin Alfa injection What is this medicine? DARBEPOETIN ALFA (dar be POE e tin AL fa) helps your body make more red blood cells. It is used to treat anemia caused by chronic kidney failure and chemotherapy. This medicine may be used for other purposes; ask your health care provider or pharmacist if you have questions. What should I tell my health care provider before I take this medicine? They need to know if you have any of these conditions: -blood clotting disorders or history of blood clots -cancer patient not on chemotherapy -cystic fibrosis -heart disease, such as angina, heart failure, or a history of a heart attack -hemoglobin level of 12 g/dL or greater -high blood pressure -low levels of folate, iron, or vitamin B12 -seizures -an unusual or allergic reaction to darbepoetin, erythropoietin, albumin, hamster proteins, latex, other medicines, foods, dyes, or preservatives -pregnant or trying to get pregnant -breast-feeding How should I use this medicine? This medicine is for injection into a vein or under the skin. It is usually given by a health care professional in a hospital or clinic setting. If you get this medicine at home, you will be taught how to prepare and give this medicine. Do not shake the solution before you withdraw a dose. Use exactly as directed. Take your medicine at regular intervals. Do not take your medicine more often than directed. It is important that you put your used needles and syringes in a special sharps container. Do not put them in a trash can. If you do not have a sharps container, call your pharmacist or healthcare provider to get one. Talk to your pediatrician regarding the use of this medicine in children. While this medicine may be used in children as young as 1 year for selected conditions, precautions do apply. Overdosage: If you think you have taken too much of this medicine contact a poison control center or emergency room at once. NOTE:  This medicine is only for you. Do not share this medicine with others. What if I miss a dose? If you miss a dose, take it as soon as you can. If it is almost time for your next dose, take only that dose. Do not take double or extra doses. What may interact with this medicine? Do not take this medicine with any of the following medications: -epoetin alfa This list may not describe all possible interactions. Give your health care provider a list of all the medicines, herbs, non-prescription drugs, or dietary supplements you use. Also tell them if you smoke, drink alcohol, or use illegal drugs. Some items may interact with your medicine. What should I watch for while using this medicine? Visit your prescriber or health care professional for regular checks on your progress and for the needed blood tests and blood pressure measurements. It is especially important for the doctor to make sure your hemoglobin level is in the desired range, to limit the risk of potential side effects and to give you the best benefit. Keep all appointments for any recommended tests. Check your blood pressure as directed. Ask your doctor what your blood pressure should be and when you should contact him or her. As your body makes more red blood cells, you may need to take iron, folic acid, or vitamin B supplements. Ask your doctor or health care provider which products are right for you. If you have kidney disease continue dietary restrictions, even though this medication can make you feel better. Talk with your doctor or health care professional about the   foods you eat and the vitamins that you take. What side effects may I notice from receiving this medicine? Side effects that you should report to your doctor or health care professional as soon as possible: -allergic reactions like skin rash, itching or hives, swelling of the face, lips, or tongue -breathing problems -changes in vision -chest pain -confusion, trouble speaking  or understanding -feeling faint or lightheaded, falls -high blood pressure -muscle aches or pains -pain, swelling, warmth in the leg -rapid weight gain -severe headaches -sudden numbness or weakness of the face, arm or leg -trouble walking, dizziness, loss of balance or coordination -seizures (convulsions) -swelling of the ankles, feet, hands -unusually weak or tired Side effects that usually do not require medical attention (report to your doctor or health care professional if they continue or are bothersome): -diarrhea -fever, chills (flu-like symptoms) -headaches -nausea, vomiting -redness, stinging, or swelling at site where injected This list may not describe all possible side effects. Call your doctor for medical advice about side effects. You may report side effects to FDA at 1-800-FDA-1088. Where should I keep my medicine? Keep out of the reach of children. Store in a refrigerator between 2 and 8 degrees C (36 and 46 degrees F). Do not freeze. Do not shake. Throw away any unused portion if using a single-dose vial. Throw away any unused medicine after the expiration date. NOTE: This sheet is a summary. It may not cover all possible information. If you have questions about this medicine, talk to your doctor, pharmacist, or health care provider.    2016, Elsevier/[employer] Standard. (2008-10-19 10:23:57)  

## 2016-06-12 LAB — RETICULOCYTES: RETICULOCYTE COUNT: 0.7 % (ref 0.6–2.6)

## 2016-06-21 ENCOUNTER — Other Ambulatory Visit: Payer: Self-pay | Admitting: Family Medicine

## 2016-06-28 ENCOUNTER — Ambulatory Visit (INDEPENDENT_AMBULATORY_CARE_PROVIDER_SITE_OTHER): Payer: Medicare Other | Admitting: Podiatry

## 2016-06-28 DIAGNOSIS — I739 Peripheral vascular disease, unspecified: Secondary | ICD-10-CM

## 2016-06-28 DIAGNOSIS — M79675 Pain in left toe(s): Secondary | ICD-10-CM | POA: Diagnosis not present

## 2016-06-28 DIAGNOSIS — B351 Tinea unguium: Secondary | ICD-10-CM | POA: Diagnosis not present

## 2016-06-28 DIAGNOSIS — M79676 Pain in unspecified toe(s): Secondary | ICD-10-CM

## 2016-06-28 NOTE — Progress Notes (Signed)
Patient ID: Alexander Rice, male   DOB: 10/07/1932, 82 y.o.   MRN: [id number] HPI  Complaint:  Visit Type: Patient returns to my office for continued preventative foot care services. Complaint: Patient states" my nails have grown long and thick and become painful to walk and wear shoes. He presents for preventative foot care services. No changes to ROS  Podiatric Exam: Vascular: dorsalis pedis and posterior tibial pulses are negative. Capillary return is diminished.. Temperature gradient is negative. Skin turgor WNL,   Sensorium: Normal Semmes Weinstein monofilament test. Normal tactile sensation bilaterally.  Nail Exam: Pt has thick disfigured discolored nails with subungual debris noted bilateral entire nail hallux  Ulcer Exam: There is no evidence of ulcer or pre-ulcerative changes or infection. Orthopedic Exam: Muscle tone and strength are WNL. No limitations in general ROM. No crepitus or effusions noted. Foot type and digits show no abnormalities. Bony prominences are unremarkable. Skin: No Porokeratosis. No infection or ulcers  Diagnosis:  Tinea unguium, Pain in right toe, pain in left toes  Treatment & Plan Procedures and Treatment: Consent by patient was obtained for treatment procedures. The patient understood the discussion of treatment and procedures well. All questions were answered thoroughly reviewed. Debridement of mycotic and hypertrophic toenails, 1 through 5 bilateral and clearing of subungual debris. No ulceration, no infection noted.  Return Visit-Office Procedure: Patient instructed to return to the office for a follow up visit 3 months for continued evaluation and treatment.   Gardiner Barefoot DPM

## 2016-07-02 ENCOUNTER — Ambulatory Visit (HOSPITAL_BASED_OUTPATIENT_CLINIC_OR_DEPARTMENT_OTHER): Payer: Medicare Other | Admitting: Family

## 2016-07-02 ENCOUNTER — Ambulatory Visit (HOSPITAL_BASED_OUTPATIENT_CLINIC_OR_DEPARTMENT_OTHER): Payer: Medicare Other

## 2016-07-02 ENCOUNTER — Encounter: Payer: Self-pay | Admitting: Family

## 2016-07-02 ENCOUNTER — Telehealth: Payer: Self-pay | Admitting: Family Medicine

## 2016-07-02 ENCOUNTER — Other Ambulatory Visit (HOSPITAL_BASED_OUTPATIENT_CLINIC_OR_DEPARTMENT_OTHER): Payer: Medicare Other

## 2016-07-02 VITALS — BP 154/53 | HR 69 | Temp 97.7°F | Resp 16 | Ht 68.0 in | Wt 158.0 lb

## 2016-07-02 DIAGNOSIS — D469 Myelodysplastic syndrome, unspecified: Secondary | ICD-10-CM | POA: Diagnosis not present

## 2016-07-02 DIAGNOSIS — D510 Vitamin B12 deficiency anemia due to intrinsic factor deficiency: Secondary | ICD-10-CM | POA: Diagnosis not present

## 2016-07-02 DIAGNOSIS — D461 Refractory anemia with ring sideroblasts: Secondary | ICD-10-CM

## 2016-07-02 DIAGNOSIS — D638 Anemia in other chronic diseases classified elsewhere: Secondary | ICD-10-CM

## 2016-07-02 DIAGNOSIS — D518 Other vitamin B12 deficiency anemias: Secondary | ICD-10-CM

## 2016-07-02 DIAGNOSIS — N189 Chronic kidney disease, unspecified: Secondary | ICD-10-CM

## 2016-07-02 DIAGNOSIS — D631 Anemia in chronic kidney disease: Secondary | ICD-10-CM

## 2016-07-02 LAB — CBC WITH DIFFERENTIAL (CANCER CENTER ONLY)
BASO#: 0 10*3/uL (ref 0.0–0.2)
BASO%: 0.2 % (ref 0.0–2.0)
EOS%: 3.9 % (ref 0.0–7.0)
Eosinophils Absolute: 0.2 10*3/uL (ref 0.0–0.5)
HCT: 23.5 % — ABNORMAL LOW (ref 38.7–49.9)
HGB: 8.1 g/dL — ABNORMAL LOW (ref 13.0–17.1)
LYMPH#: 1.6 10*3/uL (ref 0.9–3.3)
LYMPH%: 33.3 % (ref 14.0–48.0)
MCH: 33.2 pg (ref 28.0–33.4)
MCHC: 34.5 g/dL (ref 32.0–35.9)
MCV: 96 fL (ref 82–98)
MONO#: 0.4 10*3/uL (ref 0.1–0.9)
MONO%: 7.9 % (ref 0.0–13.0)
NEUT#: 2.7 10*3/uL (ref 1.5–6.5)
NEUT%: 54.7 % (ref 40.0–80.0)
PLATELETS: 582 10*3/uL — AB (ref 145–400)
RBC: 2.44 10*6/uL — ABNORMAL LOW (ref 4.20–5.70)
RDW: 21.8 % — ABNORMAL HIGH (ref 11.1–15.7)
WBC: 4.9 10*3/uL (ref 4.0–10.0)

## 2016-07-02 LAB — COMPREHENSIVE METABOLIC PANEL
ALT: 18 U/L (ref 0–55)
ANION GAP: 7 meq/L (ref 3–11)
AST: 21 U/L (ref 5–34)
Albumin: 3.5 g/dL (ref 3.5–5.0)
Alkaline Phosphatase: 45 U/L (ref 40–150)
BILIRUBIN TOTAL: 0.86 mg/dL (ref 0.20–1.20)
BUN: 24.2 mg/dL (ref 7.0–26.0)
CALCIUM: 8.9 mg/dL (ref 8.4–10.4)
CO2: 22 meq/L (ref 22–29)
CREATININE: 0.9 mg/dL (ref 0.7–1.3)
Chloride: 103 mEq/L (ref 98–109)
EGFR: 78 mL/min/{1.73_m2} — ABNORMAL LOW (ref >90)
Glucose: 91 mg/dl (ref 70–140)
Potassium: 4.7 mEq/L (ref 3.5–5.1)
Sodium: 132 mEq/L — ABNORMAL LOW (ref 136–145)
TOTAL PROTEIN: 5.8 g/dL — AB (ref 6.4–8.3)

## 2016-07-02 LAB — IRON AND TIBC
Iron: 195 ug/dL — ABNORMAL HIGH (ref 42–163)
TIBC: 188 ug/dL — ABNORMAL LOW (ref 202–409)
UIBC: <1 ug/dL (ref 117–376)

## 2016-07-02 LAB — FERRITIN: FERRITIN: 827 ng/mL — AB (ref 22–316)

## 2016-07-02 MED ORDER — EPOETIN ALFA 40000 UNIT/ML IJ SOLN
INTRAMUSCULAR | Status: AC
  Filled 2016-07-02: qty 1

## 2016-07-02 MED ORDER — EPOETIN ALFA 40000 UNIT/ML IJ SOLN
40000.0000 [IU] | Freq: Once | INTRAMUSCULAR | Status: AC
  Administered 2016-07-02: 40000 [IU] via SUBCUTANEOUS

## 2016-07-02 NOTE — Telephone Encounter (Signed)
Yes thanks, may refer 

## 2016-07-02 NOTE — Telephone Encounter (Signed)
Pt daughter is requesting a referral for her dad to [facility] surgery [facility] lesion on back of his neck near hairline. Pt has medicare and secondary uhc

## 2016-07-02 NOTE — Patient Instructions (Signed)
Epoetin Alfa injection What is this medicine? EPOETIN ALFA (e POE e tin AL fa) helps your body make more red blood cells. This medicine is used to treat anemia caused by chronic kidney failure, cancer chemotherapy, or HIV-therapy. It may also be used before surgery if you have anemia. This medicine may be used for other purposes; ask your health care provider or pharmacist if you have questions. What should I tell my health care provider before I take this medicine? They need to know if you have any of these conditions: -blood clotting disorders -cancer patient not on chemotherapy -cystic fibrosis -heart disease, such as angina or heart failure -hemoglobin level of 12 g/dL or greater -high blood pressure -low levels of folate, iron, or vitamin B12 -seizures -an unusual or allergic reaction to erythropoietin, albumin, benzyl alcohol, hamster proteins, other medicines, foods, dyes, or preservatives -pregnant or trying to get pregnant -breast-feeding How should I use this medicine? This medicine is for injection into a vein or under the skin. It is usually given by a health care professional in a hospital or clinic setting. If you get this medicine at home, you will be taught how to prepare and give this medicine. Use exactly as directed. Take your medicine at regular intervals. Do not take your medicine more often than directed. It is important that you put your used needles and syringes in a special sharps container. Do not put them in a trash can. If you do not have a sharps container, call your pharmacist or healthcare provider to get one. Talk to your pediatrician regarding the use of this medicine in children. While this drug may be prescribed for selected conditions, precautions do apply. Overdosage: If you think you have taken too much of this medicine contact a poison control center or emergency room at once. NOTE: This medicine is only for you. Do not share this medicine with  others. What if I miss a dose? If you miss a dose, take it as soon as you can. If it is almost time for your next dose, take only that dose. Do not take double or extra doses. What may interact with this medicine? Do not take this medicine with any of the following medications: -darbepoetin alfa This list may not describe all possible interactions. Give your health care provider a list of all the medicines, herbs, non-prescription drugs, or dietary supplements you use. Also tell them if you smoke, drink alcohol, or use illegal drugs. Some items may interact with your medicine. What should I watch for while using this medicine? Visit your prescriber or health care professional for regular checks on your progress and for the needed blood tests and blood pressure measurements. It is especially important for the doctor to make sure your hemoglobin level is in the desired range, to limit the risk of potential side effects and to give you the best benefit. Keep all appointments for any recommended tests. Check your blood pressure as directed. Ask your doctor what your blood pressure should be and when you should contact him or her. As your body makes more red blood cells, you may need to take iron, folic acid, or vitamin B supplements. Ask your doctor or health care provider which products are right for you. If you have kidney disease continue dietary restrictions, even though this medication can make you feel better. Talk with your doctor or health care professional about the foods you eat and the vitamins that you take. What side effects may I notice   from receiving this medicine? Side effects that you should report to your doctor or health care professional as soon as possible: -allergic reactions like skin rash, itching or hives, swelling of the face, lips, or tongue -breathing problems -changes in vision -chest pain -confusion, trouble speaking or understanding -feeling faint or lightheaded,  falls -high blood pressure -muscle aches or pains -pain, swelling, warmth in the leg -rapid weight gain -severe headaches -sudden numbness or weakness of the face, arm or leg -trouble walking, dizziness, loss of balance or coordination -seizures (convulsions) -swelling of the ankles, feet, hands -unusually weak or tired Side effects that usually do not require medical attention (report to your doctor or health care professional if they continue or are bothersome): -diarrhea -fever, chills (flu-like symptoms) -headaches -nausea, vomiting -redness, stinging, or swelling at site where injected This list may not describe all possible side effects. Call your doctor for medical advice about side effects. You may report side effects to FDA at 1-800-FDA-1088. Where should I keep my medicine? Keep out of the reach of children. Store in a refrigerator between 2 and 8 degrees C (36 and 46 degrees F). Do not freeze or shake. Throw away any unused portion if using a single-dose vial. Multi-dose vials can be kept in the refrigerator for up to 21 days after the initial dose. Throw away unused medicine. NOTE: This sheet is a summary. It may not cover all possible information. If you have questions about this medicine, talk to your doctor, pharmacist, or health care provider.    2016, [employer] Standard. (2008-10-19 10:25:44)  

## 2016-07-02 NOTE — Progress Notes (Signed)
Hematology and Oncology Follow Up Visit  [name] [id number] 11/16/1932 82 y.o. 07/02/2016   Principle Diagnosis:  Anemia of renal insufficiency  Refractory anemia with ringed sideroblasts. Pernicious anemia  Current Therapy:   Procrit 40,000 units subcutaneous monthly Vitamin B6 250 mg by mouth daily    Interim History: [name] is here today with his wife and daughter for a follow-up. He is doing well. He has intermittent fatigue at times and some mild SOB with over exertion. This resolves with taking some time to rest. He has had no other cardiac events or syncope.  His Hgb today is 8.1 with an MCV of 96. His Iron studies were high at his last visit. We continue to monitor these regularly.  No fever, chills, n/v, cough, rash, headache, dizziness, vision changes, chest pain, palpitations, abdominal pain or changes in bowel or bladder habits. He continues to be incontinent of both stool and urine at times. His nocturnal incontinence seems to have improved some. It is becoming less frequent.  No swelling, tenderness, numbness or tingling in his extremities. No c/o joint aches or bone pain.  No lymphadenopathy found on exam. No falls.   He has maintained a good appetite and is staying well hydrated. His weight is up 7 lbs since his last visit.   Medications:    Medication List       Accurate as of 07/02/16  1:25 PM. Always use your most recent med list.          aspirin 81 MG tablet Take 81 mg by mouth daily.   [facility]S OMEGA-3 KRILL OIL PO Take 350 mg by mouth daily. Reported on 01/16/2016   [facility] PROBIOTIC Chew Chew by mouth daily.   cyanocobalamin 100 MCG tablet Take by mouth daily.   dorzolamide 2 % ophthalmic solution Commonly known as:  TRUSOPT Place 1 drop into both eyes 2 (two) times daily.   doxazosin 4 MG tablet Commonly known as:  CARDURA Take 1 tablet by mouth at  bedtime   ferrous sulfate 325 (65 FE) MG tablet Take 325 mg by mouth daily with  breakfast.   lisinopril 2.5 MG tablet Commonly known as:  PRINIVIL,ZESTRIL Take 1 tablet (2.5 mg total) by mouth daily.   LUMIGAN 0.01 % Soln Generic drug:  bimatoprost Place 1 drop into both eyes at bedtime.   memantine 5 MG tablet Commonly known as:  NAMENDA Take 1 tablet (5 mg total) by mouth 2 (two) times daily.   multivitamin tablet Take 1 tablet by mouth daily.   MYRBETRIQ 50 MG Tb24 tablet Generic drug:  mirabegron ER Take 50 mg by mouth daily.   omeprazole 20 MG capsule Commonly known as:  PRILOSEC   simvastatin 40 MG tablet Commonly known as:  ZOCOR TAKE 1 TABLET BY MOUTH  DAILY AT 6 PM   vitamin B-6 250 MG tablet Take 1 tablet (250 mg total) by mouth daily.       Allergies: No Known Allergies  Past Medical History, Surgical history, Social history, and Family History were reviewed and updated.  Review of Systems: All other 10 point review of systems is negative.   Physical Exam:  height is 5\' 8"  (1.727 m) and weight is 158 lb (71.7 kg). His oral temperature is 97.7 F (36.5 C). His blood pressure is 154/53 (abnormal) and his pulse is 69. His respiration is 16.   Wt Readings from Last 3 Encounters:  07/02/16 158 lb (71.7 kg)  06/11/16 151 lb (68.5 kg)  05/29/16 148 lb (67.1 kg)    Ocular: Sclerae unicteric, pupils equal, round and reactive to light Ear-nose-throat: Oropharynx clear, dentition fair Lymphatic: No cervical supraclavicular or axillary adenopathy Lungs no rales or rhonchi, good excursion bilaterally Heart regular rate and rhythm, no murmur appreciated Abd soft, nontender, positive bowel sounds, no liver or spleen tip palpated on exam MSK no focal spinal tenderness, no joint edema Neuro: non-focal, well-oriented, appropriate affect Breast: Deferred   Lab Results  Component Value Date   WBC 4.9 07/02/2016   HGB 8.1 (L) 07/02/2016   HCT 23.5 (L) 07/02/2016   MCV 96 07/02/2016   PLT 582 (H) 07/02/2016   Lab Results  Component  Value Date   FERRITIN 1,021 (H) 06/11/2016   IRON 177 (H) 06/11/2016   TIBC 178 (L) 06/11/2016   UIBC <1.0 06/11/2016   IRONPCTSAT 99 (H) 06/11/2016   Lab Results  Component Value Date   RETICCTPCT 1.0 11/25/2015   RBC 2.44 (L) 07/02/2016   RETICCTABS 27.2 11/25/2015   Lab Results  Component Value Date   KPAFRELGTCHN 3.09 (H) 04/22/2008   LAMBDASER 1.01 04/22/2008   KAPLAMBRATIO 3.06 (H) 04/22/2008   No results found for: Kandis Cocking, IGMSERUM Lab Results  Component Value Date   TOTALPROTELP 6.3 04/22/2008   ALBUMINELP 66.3 (H) 04/22/2008   A1GS 3.9 04/22/2008   A2GS 8.1 04/22/2008   BETS 5.2 04/22/2008   BETA2SER 4.0 04/22/2008   GAMS 12.5 04/22/2008   MSPIKE NOT DET 04/22/2008   SPEI * 04/22/2008     Chemistry      Component Value Date/Time   NA 132 (L) 06/11/2016 1136   K 4.4 06/11/2016 1136   CL 99 05/02/2016 1219   CL 100 04/26/2016 1304   CO2 25 06/11/2016 1136   BUN 21.8 06/11/2016 1136   CREATININE 0.9 06/11/2016 1136      Component Value Date/Time   CALCIUM 8.6 06/11/2016 1136   ALKPHOS 52 06/11/2016 1136   AST 18 06/11/2016 1136   ALT 20 06/11/2016 1136   BILITOT 0.83 06/11/2016 1136     Impression and Plan: Mr. Canizares is an 82 yo white male with myelodysplasia and refractory anemia with ringed sideroblasts. He has some fatigue but otherwise seems to be doing a little better. His weight is improving and he has had no new cardiac events.  His Hgb is 8.1 with an MCV of 96 so he will get a dose of Procrit today.  We will plan to see him back again in 3 weeks for repeat lab work and follow-up.  Both his wife and daughter know to contact us with any questions or concerns. We can certainly see him sooner if need be.   Eliezer Bottom, NP 8/14/20171:25 PM

## 2016-07-03 ENCOUNTER — Other Ambulatory Visit: Payer: Self-pay

## 2016-07-03 DIAGNOSIS — L989 Disorder of the skin and subcutaneous tissue, unspecified: Secondary | ICD-10-CM

## 2016-07-03 LAB — RETICULOCYTES: Reticulocyte Count: 0.9 % (ref 0.6–2.6)

## 2016-07-03 NOTE — Telephone Encounter (Signed)
Referral made to Dermatology for Alexander Rice as requested.

## 2016-07-24 ENCOUNTER — Ambulatory Visit: Payer: Medicare Other | Admitting: Hematology & Oncology

## 2016-07-24 ENCOUNTER — Ambulatory Visit: Payer: Medicare Other

## 2016-07-24 ENCOUNTER — Other Ambulatory Visit: Payer: Medicare Other

## 2016-07-30 ENCOUNTER — Ambulatory Visit (HOSPITAL_COMMUNITY)
Admission: RE | Admit: 2016-07-30 | Discharge: 2016-07-30 | Disposition: A | Discharge status: Home / Self Care | Payer: Medicare Other | Source: Ambulatory Visit | Attending: Hematology & Oncology | Admitting: Hematology & Oncology

## 2016-07-30 ENCOUNTER — Ambulatory Visit (HOSPITAL_BASED_OUTPATIENT_CLINIC_OR_DEPARTMENT_OTHER): Payer: Medicare Other

## 2016-07-30 ENCOUNTER — Other Ambulatory Visit (HOSPITAL_BASED_OUTPATIENT_CLINIC_OR_DEPARTMENT_OTHER): Payer: Medicare Other

## 2016-07-30 ENCOUNTER — Ambulatory Visit (HOSPITAL_BASED_OUTPATIENT_CLINIC_OR_DEPARTMENT_OTHER): Payer: Medicare Other | Admitting: Hematology & Oncology

## 2016-07-30 ENCOUNTER — Encounter: Payer: Self-pay | Admitting: Hematology & Oncology

## 2016-07-30 VITALS — BP 164/57 | HR 78 | Temp 97.7°F | Resp 18 | Ht 68.0 in | Wt 167.0 lb

## 2016-07-30 DIAGNOSIS — D469 Myelodysplastic syndrome, unspecified: Secondary | ICD-10-CM

## 2016-07-30 DIAGNOSIS — M1990 Unspecified osteoarthritis, unspecified site: Secondary | ICD-10-CM | POA: Diagnosis not present

## 2016-07-30 DIAGNOSIS — D461 Refractory anemia with ring sideroblasts: Secondary | ICD-10-CM

## 2016-07-30 DIAGNOSIS — D4620 Refractory anemia with excess of blasts, unspecified: Secondary | ICD-10-CM | POA: Insufficient documentation

## 2016-07-30 DIAGNOSIS — D510 Vitamin B12 deficiency anemia due to intrinsic factor deficiency: Secondary | ICD-10-CM

## 2016-07-30 DIAGNOSIS — D46Z Other myelodysplastic syndromes: Secondary | ICD-10-CM

## 2016-07-30 DIAGNOSIS — D518 Other vitamin B12 deficiency anemias: Secondary | ICD-10-CM

## 2016-07-30 LAB — CBC WITH DIFFERENTIAL (CANCER CENTER ONLY)
BASO#: 0 10*3/uL (ref 0.0–0.2)
BASO%: 0.4 % (ref 0.0–2.0)
EOS%: 3.2 % (ref 0.0–7.0)
Eosinophils Absolute: 0.2 10*3/uL (ref 0.0–0.5)
HEMATOCRIT: 21.7 % — AB (ref 38.7–49.9)
HGB: 7.4 g/dL — ABNORMAL LOW (ref 13.0–17.1)
LYMPH#: 1.6 10*3/uL (ref 0.9–3.3)
LYMPH%: 28.7 % (ref 14.0–48.0)
MCH: 34.6 pg — ABNORMAL HIGH (ref 28.0–33.4)
MCHC: 34.1 g/dL (ref 32.0–35.9)
MCV: 101 fL — ABNORMAL HIGH (ref 82–98)
MONO#: 0.5 10*3/uL (ref 0.1–0.9)
MONO%: 8.1 % (ref 0.0–13.0)
NEUT#: 3.4 10*3/uL (ref 1.5–6.5)
NEUT%: 59.6 % (ref 40.0–80.0)
Platelets: 576 10*3/uL — ABNORMAL HIGH (ref 145–400)
RBC: 2.14 10*6/uL — AB (ref 4.20–5.70)
RDW: 21.5 % — AB (ref 11.1–15.7)
WBC: 5.7 10*3/uL (ref 4.0–10.0)

## 2016-07-30 LAB — COMPREHENSIVE METABOLIC PANEL
ALT: 18 U/L (ref 0–55)
ANION GAP: 7 meq/L (ref 3–11)
AST: 17 U/L (ref 5–34)
Albumin: 3.6 g/dL (ref 3.5–5.0)
Alkaline Phosphatase: 47 U/L (ref 40–150)
BUN: 20.3 mg/dL (ref 7.0–26.0)
CALCIUM: 8.7 mg/dL (ref 8.4–10.4)
CHLORIDE: 103 meq/L (ref 98–109)
CO2: 21 meq/L — AB (ref 22–29)
CREATININE: 0.9 mg/dL (ref 0.7–1.3)
EGFR: 77 mL/min/{1.73_m2} — ABNORMAL LOW (ref >90)
GLUCOSE: 99 mg/dL (ref 70–140)
POTASSIUM: 4.4 meq/L (ref 3.5–5.1)
Sodium: 131 mEq/L — ABNORMAL LOW (ref 136–145)
TOTAL PROTEIN: 5.8 g/dL — AB (ref 6.4–8.3)
Total Bilirubin: 0.93 mg/dL (ref 0.20–1.20)

## 2016-07-30 LAB — FERRITIN: Ferritin: 635 ng/ml — ABNORMAL HIGH (ref 22–316)

## 2016-07-30 LAB — IRON AND TIBC
%SAT: 98 % — AB (ref 20–55)
Iron: 196 ug/dL — ABNORMAL HIGH (ref 42–163)
TIBC: 200 ug/dL — ABNORMAL LOW (ref 202–409)
UIBC: 4 ug/dL — AB (ref 117–376)

## 2016-07-30 MED ORDER — EPOETIN ALFA 40000 UNIT/ML IJ SOLN
40000.0000 [IU] | Freq: Once | INTRAMUSCULAR | Status: AC
  Administered 2016-07-30: 40000 [IU] via SUBCUTANEOUS

## 2016-07-30 MED ORDER — EPOETIN ALFA 40000 UNIT/ML IJ SOLN
INTRAMUSCULAR | Status: AC
  Filled 2016-07-30: qty 1

## 2016-07-30 NOTE — Progress Notes (Signed)
Hematology and Oncology Follow Up Visit  Alexander Rice [id number] 07-06-1932 82 y.o. 07/30/2016   Principle Diagnosis:   Refractory anemia with ringed sideroblasts.  Pernicious anemia  Severe osteoarthritis  Current Therapy:    Procrit 40,000 units subcutaneous every 3 weeks  Vitamin B6 250 mg by mouth daily  Vitamin B-12 1 mg IM every month-done at home     Interim History:  Alexander Rice is back for follow-up.  He is doing okay. He comes in with his wife and daughter. They are doing okay.  He has had some weakness. He is not as active. His appetite seems be doing pretty well.  He has had no bleeding. He has had no fever.  His been no further cardiac issues or cerebrovascular issues.  He clearly has iron overload problems but he is not a candidate for iron chelating therapy.   Overall, his performance status is ECOG 2.  Medications:  Current Outpatient Prescriptions:  .  aspirin 81 MG tablet, Take 81 mg by mouth daily.  , Disp: , Rfl:  .  [facility] OMEGA-3 KRILL OIL PO, Take 350 mg by mouth daily. Reported on 01/16/2016, Disp: , Rfl:  .  cyanocobalamin 100 MCG tablet, Take by mouth daily., Disp: , Rfl:  .  dorzolamide (TRUSOPT) 2 % ophthalmic solution, Place 1 drop into both eyes 2 (two) times daily.  , Disp: , Rfl:  .  doxazosin (CARDURA) 4 MG tablet, Take 1 tablet by mouth at  bedtime, Disp: 60 tablet, Rfl: 2 .  ferrous sulfate 325 (65 FE) MG tablet, Take 325 mg by mouth daily with breakfast., Disp: , Rfl:  .  lisinopril (PRINIVIL,ZESTRIL) 2.5 MG tablet, Take 1 tablet (2.5 mg total) by mouth daily., Disp: 90 tablet, Rfl: 3 .  LUMIGAN 0.01 % SOLN, Place 1 drop into both eyes at bedtime. , Disp: , Rfl:  .  memantine (NAMENDA) 5 MG tablet, Take 1 tablet (5 mg total) by mouth 2 (two) times daily., Disp: 180 tablet, Rfl: 1 .  mirabegron ER (MYRBETRIQ) 50 MG TB24 tablet, Take 50 mg by mouth daily., Disp: , Rfl:  .  Multiple Vitamin (MULTIVITAMIN) tablet, Take 1 tablet by  mouth daily.  , Disp: , Rfl:  .  omeprazole (PRILOSEC) 20 MG capsule, , Disp: , Rfl:  .  Probiotic Product ([facility]S PROBIOTIC) CHEW, Chew by mouth daily., Disp: , Rfl:  .  Pyridoxine HCl (VITAMIN B-6) 250 MG tablet, Take 1 tablet (250 mg total) by mouth daily., Disp: 90 tablet, Rfl: 3 .  simvastatin (ZOCOR) 40 MG tablet, TAKE 1 TABLET BY MOUTH  DAILY AT 6 PM, Disp: 90 tablet, Rfl: 2 No current facility-administered medications for this visit.   Facility-Administered Medications Ordered in Other Visits:  .  darbepoetin (ARANESP) injection 300 mcg, 300 mcg, Subcutaneous, Once, [name], MD  Allergies: No Known Allergies  Past Medical History, Surgical history, Social history, and Family History were reviewed and updated.  Review of Systems: As above  Physical Exam:  height is 5\' 8"  (1.727 m) and weight is 167 lb (75.8 kg). His oral temperature is 97.7 F (36.5 C). His blood pressure is 164/57 (abnormal) and his pulse is 78. His respiration is 18.   Wt Readings from Last 3 Encounters:  07/30/16 167 lb (75.8 kg)  07/02/16 158 lb (71.7 kg)  06/11/16 151 lb (68.5 kg)     Elderly white gentleman in no obvious distress. Head and neck exam shows no ocular or oral lesions.  There are no palpable cervical or supraclavicular lymph nodes. Lungs are clear. Cardiac exam regular rate and rhythm with no murmurs, rubs or bruits. Abdomen is soft. He has good bowel sounds. There is no fluid wave. There is no palpable liver or spleen tip. Back exam shows some kyphosis. Extremities shows some age-related osteoarthritic changes. He has decent range of motion of his joints. Skin exam shows no rashes, ecchymoses or petechia.  Lab Results  Component Value Date   WBC 5.7 07/30/2016   HGB 7.4 (L) 07/30/2016   HCT 21.7 (L) 07/30/2016   MCV 101 (H) 07/30/2016   PLT 576 (H) 07/30/2016     Chemistry      Component Value Date/Time   NA 132 (L) 07/02/2016 1131   K 4.7 07/02/2016 1131   CL 99 05/02/2016  1219   CL 100 04/26/2016 1304   CO2 22 07/02/2016 1131   BUN 24.2 07/02/2016 1131   CREATININE 0.9 07/02/2016 1131      Component Value Date/Time   CALCIUM 8.9 07/02/2016 1131   ALKPHOS 45 07/02/2016 1131   AST 21 07/02/2016 1131   ALT 18 07/02/2016 1131   BILITOT 0.86 07/02/2016 1131         Impression and Plan: Alexander Rice is 82 year old gentleman with myelodysplasia. He has refractory anemia with ringed sideroblasts.   I will go ahead and give him Procrit today.  I will go ahead and give him 2 units of blood. I think he can benefit from this. He is somewhat fatigued. He is frail. I think a blood transfusion will make things a little better for his quality of life. I think this also will help his family out. It may allow him to be a little bit more independent.  I do want to get him back to see him in another 3 weeks.  [name], MD 9/11/201711:24 AM

## 2016-07-30 NOTE — Progress Notes (Signed)
11:31 AM To get 2 units PRBCs 07-31-16.

## 2016-07-30 NOTE — Patient Instructions (Signed)
Epoetin Alfa injection What is this medicine? EPOETIN ALFA (e POE e tin AL fa) helps your body make more red blood cells. This medicine is used to treat anemia caused by chronic kidney failure, cancer chemotherapy, or HIV-therapy. It may also be used before surgery if you have anemia. This medicine may be used for other purposes; ask your health care provider or pharmacist if you have questions. What should I tell my health care provider before I take this medicine? They need to know if you have any of these conditions: -blood clotting disorders -cancer patient not on chemotherapy -cystic fibrosis -heart disease, such as angina or heart failure -hemoglobin level of 12 g/dL or greater -high blood pressure -low levels of folate, iron, or vitamin B12 -seizures -an unusual or allergic reaction to erythropoietin, albumin, benzyl alcohol, hamster proteins, other medicines, foods, dyes, or preservatives -pregnant or trying to get pregnant -breast-feeding How should I use this medicine? This medicine is for injection into a vein or under the skin. It is usually given by a health care professional in a hospital or clinic setting. If you get this medicine at home, you will be taught how to prepare and give this medicine. Use exactly as directed. Take your medicine at regular intervals. Do not take your medicine more often than directed. It is important that you put your used needles and syringes in a special sharps container. Do not put them in a trash can. If you do not have a sharps container, call your pharmacist or healthcare provider to get one. Talk to your pediatrician regarding the use of this medicine in children. While this drug may be prescribed for selected conditions, precautions do apply. Overdosage: If you think you have taken too much of this medicine contact a poison control center or emergency room at once. NOTE: This medicine is only for you. Do not share this medicine with  others. What if I miss a dose? If you miss a dose, take it as soon as you can. If it is almost time for your next dose, take only that dose. Do not take double or extra doses. What may interact with this medicine? Do not take this medicine with any of the following medications: -darbepoetin alfa This list may not describe all possible interactions. Give your health care provider a list of all the medicines, herbs, non-prescription drugs, or dietary supplements you use. Also tell them if you smoke, drink alcohol, or use illegal drugs. Some items may interact with your medicine. What should I watch for while using this medicine? Visit your prescriber or health care professional for regular checks on your progress and for the needed blood tests and blood pressure measurements. It is especially important for the doctor to make sure your hemoglobin level is in the desired range, to limit the risk of potential side effects and to give you the best benefit. Keep all appointments for any recommended tests. Check your blood pressure as directed. Ask your doctor what your blood pressure should be and when you should contact him or her. As your body makes more red blood cells, you may need to take iron, folic acid, or vitamin B supplements. Ask your doctor or health care provider which products are right for you. If you have kidney disease continue dietary restrictions, even though this medication can make you feel better. Talk with your doctor or health care professional about the foods you eat and the vitamins that you take. What side effects may I notice   from receiving this medicine? Side effects that you should report to your doctor or health care professional as soon as possible: -allergic reactions like skin rash, itching or hives, swelling of the face, lips, or tongue -breathing problems -changes in vision -chest pain -confusion, trouble speaking or understanding -feeling faint or lightheaded,  falls -high blood pressure -muscle aches or pains -pain, swelling, warmth in the leg -rapid weight gain -severe headaches -sudden numbness or weakness of the face, arm or leg -trouble walking, dizziness, loss of balance or coordination -seizures (convulsions) -swelling of the ankles, feet, hands -unusually weak or tired Side effects that usually do not require medical attention (report to your doctor or health care professional if they continue or are bothersome): -diarrhea -fever, chills (flu-like symptoms) -headaches -nausea, vomiting -redness, stinging, or swelling at site where injected This list may not describe all possible side effects. Call your doctor for medical advice about side effects. You may report side effects to FDA at 1-800-FDA-1088. Where should I keep my medicine? Keep out of the reach of children. Store in a refrigerator between 2 and 8 degrees C (36 and 46 degrees F). Do not freeze or shake. Throw away any unused portion if using a single-dose vial. Multi-dose vials can be kept in the refrigerator for up to 21 days after the initial dose. Throw away unused medicine. NOTE: This sheet is a summary. It may not cover all possible information. If you have questions about this medicine, talk to your doctor, pharmacist, or health care provider.    2016, [employer] Standard. (2008-10-19 10:25:44)  

## 2016-07-31 ENCOUNTER — Ambulatory Visit (HOSPITAL_BASED_OUTPATIENT_CLINIC_OR_DEPARTMENT_OTHER): Payer: Medicare Other

## 2016-07-31 DIAGNOSIS — D4620 Refractory anemia with excess of blasts, unspecified: Secondary | ICD-10-CM | POA: Diagnosis not present

## 2016-07-31 DIAGNOSIS — D46Z Other myelodysplastic syndromes: Secondary | ICD-10-CM

## 2016-07-31 DIAGNOSIS — D461 Refractory anemia with ring sideroblasts: Secondary | ICD-10-CM | POA: Diagnosis present

## 2016-07-31 LAB — PREPARE RBC (CROSSMATCH)

## 2016-07-31 LAB — RETICULOCYTES: Reticulocyte Count: 1.6 % (ref 0.6–2.6)

## 2016-07-31 MED ORDER — FUROSEMIDE 10 MG/ML IJ SOLN: 10.0000 mg | Freq: Once | INTRAMUSCULAR | Status: DC

## 2016-07-31 MED ORDER — SODIUM CHLORIDE 0.9 % IV SOLN
250.0000 mL | Freq: Once | INTRAVENOUS | Status: AC
  Administered 2016-07-31: 250 mL via INTRAVENOUS

## 2016-07-31 NOTE — Patient Instructions (Signed)

## 2016-08-01 LAB — TYPE AND SCREEN
ABO/RH(D): A POS
ANTIBODY SCREEN: NEGATIVE
UNIT DIVISION: 0
Unit division: 0

## 2016-08-02 ENCOUNTER — Other Ambulatory Visit: Payer: Self-pay | Admitting: Family Medicine

## 2016-08-02 ENCOUNTER — Encounter: Payer: Self-pay | Admitting: Hematology & Oncology

## 2016-08-03 ENCOUNTER — Encounter: Payer: Self-pay | Admitting: Family Medicine

## 2016-08-03 ENCOUNTER — Ambulatory Visit (INDEPENDENT_AMBULATORY_CARE_PROVIDER_SITE_OTHER): Payer: Medicare Other | Admitting: Family Medicine

## 2016-08-03 DIAGNOSIS — I10 Essential (primary) hypertension: Secondary | ICD-10-CM | POA: Diagnosis not present

## 2016-08-03 DIAGNOSIS — I25119 Atherosclerotic heart disease of native coronary artery with unspecified angina pectoris: Secondary | ICD-10-CM

## 2016-08-03 MED ORDER — LISINOPRIL 5 MG PO TABS: 5.0000 mg | ORAL_TABLET | Freq: Every day | ORAL | 3 refills | Status: DC

## 2016-08-03 NOTE — Progress Notes (Signed)
Pre visit review using our clinic review tool, if applicable. No additional management support is needed unless otherwise documented below in the visit note. 

## 2016-08-03 NOTE — Patient Instructions (Addendum)
Increase lisinopril to 5mg  (take two of the 2.5mg  pills until the new prescription arrives)  Update me if 2 readings in a row at oncology are above [id number] sytolic or 90 diastolic. otherwise see you in 6 months

## 2016-08-03 NOTE — Progress Notes (Signed)
Subjective:  Alexander SierrasWilliam T Rice is a 82 y.o. year old very pleasant male patient who presents for/with See problem oriented charting ROS- some fatigue, no chest pain. No headaches. Feeling better since transfusion though.see any ROS included in HPI as well.   Past Medical History-  Patient Active Problem List   Diagnosis Date Noted  . Bradycardia 04/27/2016    Priority: High  . Unresponsiveness 04/27/2016    Priority: High  . Alzheimer's dementia 03/31/2015    Priority: High  . MDS (myelodysplastic syndrome) (HCC) 05/14/2011    Priority: High  . CAD (coronary artery disease) 09/29/2006    Priority: High  . Hyperlipidemia 09/29/2006    Priority: Medium  . Essential hypertension 09/29/2006    Priority: Medium  . BPH (benign prostatic hyperplasia) 09/29/2006    Priority: Medium  . Spinal stenosis of lumbar region 03/31/2015    Priority: Low  . Pernicious anemia 03/11/2015    Priority: Low  . Glaucoma 12/01/2014    Priority: Low  . Pulmonary nodule 04/08/2012    Priority: Low  . ANEMIA, B12 DEFICIENCY 07/03/2007    Priority: Low  . GERD 09/29/2006    Priority: Low    Medications- reviewed and updated Current Outpatient Prescriptions  Medication Sig Dispense Refill  . aspirin 81 MG tablet Take 81 mg by mouth daily.      . [facility] OMEGA-3 KRILL OIL PO Take 350 mg by mouth daily. Reported on 01/16/2016    . cyanocobalamin 100 MCG tablet Take by mouth daily.    . dorzolamide (TRUSOPT) 2 % ophthalmic solution Place 1 drop into both eyes 2 (two) times daily.      [name]. doxazosin (CARDURA) 4 MG tablet Take 1 tablet by mouth at  bedtime 60 tablet 2  . ferrous sulfate 325 (65 FE) MG tablet Take 325 mg by mouth daily with breakfast.    . LUMIGAN 0.01 % SOLN Place 1 drop into both eyes at bedtime.     . memantine (NAMENDA) 5 MG tablet Take 1 tablet (5 mg total) by mouth 2 (two) times daily. 180 tablet 1  . mirabegron ER (MYRBETRIQ) 50 MG TB24 tablet Take 50 mg by mouth daily.    . Multiple  Vitamin (MULTIVITAMIN) tablet Take 1 tablet by mouth daily.      [name]. omeprazole (PRILOSEC) 20 MG capsule     . Probiotic Product ([facility] PROBIOTIC) CHEW Chew by mouth daily.    . Pyridoxine HCl (VITAMIN B-6) 250 MG tablet Take 1 tablet (250 mg total) by mouth daily. 90 tablet 3  . simvastatin (ZOCOR) 40 MG tablet TAKE 1 TABLET BY MOUTH  DAILY AT 6 PM 90 tablet 1  . lisinopril (PRINIVIL,ZESTRIL) 5 MG tablet Take 1 tablet (5 mg total) by mouth daily. 90 tablet 3   No current facility-administered medications for this visit.    Facility-Administered Medications Ordered in Other Visits  Medication Dose Route Frequency Provider Last Rate Last Dose  . darbepoetin (ARANESP) injection 300 mcg  300 mcg Subcutaneous Once Josph MachoPeter R Ennever, MD        Objective: BP (!) 152/60 (BP Location: Left Arm, Patient Position: Sitting, Cuff Size: Large)   Pulse 68   Temp 97.6 F (36.4 C) (Oral)   Wt 165 lb 3.2 oz (74.9 kg)   SpO2 97%   BMI 25.12 kg/m  Gen: NAD, resting comfortably Some mucus membrane pallor CV: RRR no murmurs rubs or gallops Lungs: CTAB no crackles, wheeze, rhonchi Abdomen: soft/nontender/nondistended/normal bowel sounds. No  rebound or guarding.  Ext: no edema Skin: warm, dry Neuro: grossly normal, moves all extremities, walks with cane. He is able to get onto and off table unassisted  Assessment/Plan:   Essential hypertension S: poor control over last 5 visits- next few visits after being seen here last time were controlled on cardura 4mg  and lisinopril 2.5mg  BP Readings from Last 3 Encounters:  08/03/16 (!) 152/60  07/31/16 (!) 187/54  07/30/16 (!) 164/57  A/P:Continue cardura 4mg  but increase lisinopril back to 5mg  with SBP goal 150 and DBP 90. With cardiac history- lower would be more ideal but with prior low BP and unresponsive episode-  We loosened goal. Follow up if higher than this at oncology visits- daughter will monitor- we will see back in 6 months otherwise    Return in  about 6 months (around 01/31/2017).  Meds ordered this encounter  Medications  . lisinopril (PRINIVIL,ZESTRIL) 5 MG tablet    Sig: Take 1 tablet (5 mg total) by mouth daily.    Dispense:  90 tablet    Refill:  3    Return precautions advised.  [name], MD

## 2016-08-03 NOTE — Assessment & Plan Note (Signed)
S: poor control over last 5 visits- next few visits after being seen here last time were controlled on cardura 4mg  and lisinopril 2.5mg  BP Readings from Last 3 Encounters:  08/03/16 (!) 152/60  07/31/16 (!) 187/54  07/30/16 (!) 164/57  A/P:Continue cardura 4mg  but increase lisinopril back to 5mg  with SBP goal 150 and DBP 90. With cardiac history- lower would be more ideal but with prior low BP and unresponsive episode-  We loosened goal. Follow up if higher than this at oncology visits- daughter will monitor- we will see back in 6 months otherwise

## 2016-08-14 ENCOUNTER — Ambulatory Visit: Payer: Medicare Other | Admitting: Neurology

## 2016-08-20 ENCOUNTER — Encounter: Payer: Self-pay | Admitting: Hematology & Oncology

## 2016-08-20 ENCOUNTER — Other Ambulatory Visit (HOSPITAL_BASED_OUTPATIENT_CLINIC_OR_DEPARTMENT_OTHER): Payer: Medicare Other

## 2016-08-20 ENCOUNTER — Ambulatory Visit (HOSPITAL_BASED_OUTPATIENT_CLINIC_OR_DEPARTMENT_OTHER): Payer: Medicare Other

## 2016-08-20 ENCOUNTER — Ambulatory Visit (HOSPITAL_BASED_OUTPATIENT_CLINIC_OR_DEPARTMENT_OTHER): Payer: Medicare Other | Admitting: Hematology & Oncology

## 2016-08-20 VITALS — BP 152/50 | HR 67 | Temp 97.4°F | Resp 16 | Ht 68.0 in | Wt 169.0 lb

## 2016-08-20 DIAGNOSIS — D46Z Other myelodysplastic syndromes: Secondary | ICD-10-CM

## 2016-08-20 DIAGNOSIS — D510 Vitamin B12 deficiency anemia due to intrinsic factor deficiency: Secondary | ICD-10-CM

## 2016-08-20 DIAGNOSIS — D461 Refractory anemia with ring sideroblasts: Secondary | ICD-10-CM | POA: Diagnosis not present

## 2016-08-20 DIAGNOSIS — M1990 Unspecified osteoarthritis, unspecified site: Secondary | ICD-10-CM

## 2016-08-20 DIAGNOSIS — D469 Myelodysplastic syndrome, unspecified: Secondary | ICD-10-CM

## 2016-08-20 LAB — CMP (CANCER CENTER ONLY)
ALT(SGPT): 61 U/L — ABNORMAL HIGH (ref 10–47)
AST: 39 U/L — AB (ref 11–38)
Albumin: 3.4 g/dL (ref 3.3–5.5)
Alkaline Phosphatase: 65 U/L (ref 26–84)
BILIRUBIN TOTAL: 0.9 mg/dL (ref 0.20–1.60)
BUN: 23 mg/dL — AB (ref 7–22)
CALCIUM: 8.5 mg/dL (ref 8.0–10.3)
CO2: 24 meq/L (ref 18–33)
Chloride: 100 mEq/L (ref 98–108)
Creat: 1.4 mg/dl — ABNORMAL HIGH (ref 0.6–1.2)
GLUCOSE: 119 mg/dL — AB (ref 73–118)
Potassium: 4.4 mEq/L (ref 3.3–4.7)
SODIUM: 127 meq/L — AB (ref 128–145)
Total Protein: 6 g/dL — ABNORMAL LOW (ref 6.4–8.1)

## 2016-08-20 LAB — CBC WITH DIFFERENTIAL (CANCER CENTER ONLY)
BASO#: 0 10*3/uL (ref 0.0–0.2)
BASO%: 0.3 % (ref 0.0–2.0)
EOS%: 3.4 % (ref 0.0–7.0)
Eosinophils Absolute: 0.2 10*3/uL (ref 0.0–0.5)
HEMATOCRIT: 24.1 % — AB (ref 38.7–49.9)
HGB: 8.2 g/dL — ABNORMAL LOW (ref 13.0–17.1)
LYMPH#: 1.5 10*3/uL (ref 0.9–3.3)
LYMPH%: 23.9 % (ref 14.0–48.0)
MCH: 34.2 pg — ABNORMAL HIGH (ref 28.0–33.4)
MCHC: 34 g/dL (ref 32.0–35.9)
MCV: 100 fL — ABNORMAL HIGH (ref 82–98)
MONO#: 0.7 10*3/uL (ref 0.1–0.9)
MONO%: 10.1 % (ref 0.0–13.0)
NEUT#: 4 10*3/uL (ref 1.5–6.5)
NEUT%: 62.3 % (ref 40.0–80.0)
PLATELETS: 484 10*3/uL — AB (ref 145–400)
RBC: 2.4 10*6/uL — ABNORMAL LOW (ref 4.20–5.70)
RDW: 18 % — AB (ref 11.1–15.7)
WBC: 6.4 10*3/uL (ref 4.0–10.0)

## 2016-08-20 LAB — CHCC SATELLITE - SMEAR

## 2016-08-20 LAB — SAMPLE TO BLOOD BANK

## 2016-08-20 MED ORDER — EPOETIN ALFA 40000 UNIT/ML IJ SOLN
INTRAMUSCULAR | Status: AC
  Filled 2016-08-20: qty 1

## 2016-08-20 MED ORDER — EPOETIN ALFA 40000 UNIT/ML IJ SOLN
40000.0000 [IU] | Freq: Once | INTRAMUSCULAR | Status: AC
  Administered 2016-08-20: 40000 [IU] via SUBCUTANEOUS

## 2016-08-20 NOTE — Progress Notes (Signed)
As well as bone or  Hematology and Oncology Follow Up Visit  Alexander Rice [id number] July 23, 1932 82 y.o. 08/20/2016   Principle Diagnosis:   Refractory anemia with ringed sideroblasts.  Pernicious anemia  Severe osteoarthritis  Current Therapy:    Procrit 40,000 units subcutaneous every 3 weeks  Vitamin B6 250 mg by mouth daily  Vitamin B-12 1 mg IM every month-done at home  Blood transfusions as indicated-last transfusion on 07/31/2016     Interim History:  Alexander Rice is back for follow-up.  He is doing okay. He comes in with his wife and daughter. They are doing okay.  He was transfused we saw him last time. I felt that this would make his quality of life a little bit better. He seemed to tolerate the transfusion.  The last time that we last saw him, his ferritin was 635 with an iron saturation of 98%. This actually is getting a little better for him. His total iron was 196.  He does have the dementia. This is a blessing in certain ways and that he really does not know exactly what is going on with his blood problem. He is very pleasant. He is always fun to talk to.   He has had no bleeding. His appetite is okay. He has had no nausea or vomiting.  He does have bad arthritis in his back. He gets around with a walking cane. This does help his ambulation.   Overall, his performance status is ECOG 3.  Medications:  Current Outpatient Prescriptions:  .  aspirin 81 MG tablet, Take 81 mg by mouth daily.  , Disp: , Rfl:  .  [facility]S OMEGA-3 KRILL OIL PO, Take 350 mg by mouth daily. Reported on 01/16/2016, Disp: , Rfl:  .  cyanocobalamin 100 MCG tablet, Take by mouth daily., Disp: , Rfl:  .  dorzolamide (TRUSOPT) 2 % ophthalmic solution, Place 1 drop into both eyes 2 (two) times daily.  , Disp: , Rfl:  .  doxazosin (CARDURA) 4 MG tablet, Take 1 tablet by mouth at  bedtime, Disp: 60 tablet, Rfl: 2 .  ferrous sulfate 325 (65 FE) MG tablet, Take 325 mg by mouth daily with  breakfast., Disp: , Rfl:  .  lisinopril (PRINIVIL,ZESTRIL) 5 MG tablet, Take 1 tablet (5 mg total) by mouth daily., Disp: 90 tablet, Rfl: 3 .  LUMIGAN 0.01 % SOLN, Place 1 drop into both eyes at bedtime. , Disp: , Rfl:  .  memantine (NAMENDA) 5 MG tablet, Take 1 tablet (5 mg total) by mouth 2 (two) times daily., Disp: 180 tablet, Rfl: 1 .  mirabegron ER (MYRBETRIQ) 50 MG TB24 tablet, Take 50 mg by mouth daily., Disp: , Rfl:  .  Multiple Vitamin (MULTIVITAMIN) tablet, Take 1 tablet by mouth daily.  , Disp: , Rfl:  .  omeprazole (PRILOSEC) 20 MG capsule, , Disp: , Rfl:  .  Probiotic Product (CVS PROBIOTIC) CHEW, Chew by mouth daily., Disp: , Rfl:  .  Pyridoxine HCl (VITAMIN B-6) 250 MG tablet, Take 1 tablet (250 mg total) by mouth daily., Disp: 90 tablet, Rfl: 3 .  simvastatin (ZOCOR) 40 MG tablet, TAKE 1 TABLET BY MOUTH  DAILY AT 6 PM, Disp: 90 tablet, Rfl: 1 No current facility-administered medications for this visit.   Facility-Administered Medications Ordered in Other Visits:  .  darbepoetin (ARANESP) injection 300 mcg, 300 mcg, Subcutaneous, Once, [name], MD  Allergies: No Known Allergies  Past Medical History, Surgical history, Social history, and  Family History were reviewed and updated.  Review of Systems: As above  Physical Exam:  height is 5\' 8"  (1.727 m) and weight is 169 lb (76.7 kg). His oral temperature is 97.4 F (36.3 C). His blood pressure is 152/50 (abnormal) and his pulse is 67. His respiration is 16.   Wt Readings from Last 3 Encounters:  08/20/16 169 lb (76.7 kg)  08/03/16 165 lb 3.2 oz (74.9 kg)  07/30/16 167 lb (75.8 kg)     Elderly white gentleman in no obvious distress. Head and neck exam shows no ocular or oral lesions. There are no palpable cervical or supraclavicular lymph nodes. Lungs are clear. Cardiac exam regular rate and rhythm with no murmurs, rubs or bruits. Abdomen is soft. He has good bowel sounds. There is no fluid wave. There is no  palpable liver or spleen tip. Back exam shows some kyphosis. Extremities shows some age-related osteoarthritic changes. He has decent range of motion of his joints. Skin exam shows no rashes, ecchymoses or petechia.  Lab Results  Component Value Date   WBC 6.4 08/20/2016   HGB 8.2 (L) 08/20/2016   HCT 24.1 (L) 08/20/2016   MCV 100 (H) 08/20/2016   PLT 484 (H) 08/20/2016     Chemistry      Component Value Date/Time   NA 127 (L) 08/20/2016 1453   NA 131 (L) 07/30/2016 1039   K 4.4 08/20/2016 1453   K 4.4 07/30/2016 1039   CL 100 08/20/2016 1453   CO2 24 08/20/2016 1453   CO2 21 (L) 07/30/2016 1039   BUN 23 (H) 08/20/2016 1453   BUN 20.3 07/30/2016 1039   CREATININE 1.4 (H) 08/20/2016 1453   CREATININE 0.9 07/30/2016 1039      Component Value Date/Time   CALCIUM 8.5 08/20/2016 1453   CALCIUM 8.7 07/30/2016 1039   ALKPHOS 65 08/20/2016 1453   ALKPHOS 47 07/30/2016 1039   AST 39 (H) 08/20/2016 1453   AST 17 07/30/2016 1039   ALT 61 (H) 08/20/2016 1453   ALT 18 07/30/2016 1039   BILITOT 0.90 08/20/2016 1453   BILITOT 0.93 07/30/2016 1039         Impression and Plan: Alexander Rice is 82 year old gentleman with myelodysplasia. He has refractory anemia with ringed sideroblasts.   I will go ahead and give him Procrit today.  IDo not think that he needs any blood. His hemoglobin has come up a little bit. He was just transfused about 3 weeks ago.   I want to get him back to see him in another 3 weeks.  [name], MD 10/2/20173:59 PM

## 2016-08-21 ENCOUNTER — Ambulatory Visit (INDEPENDENT_AMBULATORY_CARE_PROVIDER_SITE_OTHER): Payer: Medicare Other | Admitting: Neurology

## 2016-08-21 ENCOUNTER — Encounter: Payer: Self-pay | Admitting: Neurology

## 2016-08-21 VITALS — BP 156/72 | HR 80 | Temp 97.7°F | Ht 68.0 in | Wt 169.2 lb

## 2016-08-21 DIAGNOSIS — F0390 Unspecified dementia without behavioral disturbance: Secondary | ICD-10-CM | POA: Diagnosis not present

## 2016-08-21 DIAGNOSIS — I25119 Atherosclerotic heart disease of native coronary artery with unspecified angina pectoris: Secondary | ICD-10-CM

## 2016-08-21 DIAGNOSIS — F03B0 Unspecified dementia, moderate, without behavioral disturbance, psychotic disturbance, mood disturbance, and anxiety: Secondary | ICD-10-CM

## 2016-08-21 LAB — FERRITIN

## 2016-08-21 LAB — IRON AND TIBC
%SAT: 33 % (ref 20–55)
IRON: 64 ug/dL (ref 42–163)
TIBC: 193 ug/dL — ABNORMAL LOW (ref 202–409)
UIBC: 129 ug/dL (ref 117–376)

## 2016-08-21 LAB — RETICULOCYTES: Reticulocyte Count: 1.5 % (ref 0.6–2.6)

## 2016-08-21 NOTE — Progress Notes (Addendum)
NEUROLOGY FOLLOW UP OFFICE NOTE  Alexander Rice [id number]  HISTORY OF PRESENT ILLNESS: I had the pleasure of seeing Alexander Rice in follow-up in the neurology clinic on 08/21/2016. He is again accompanied by his wife and daughter who helps supplement the history today. The patient was last seen 8 months ago for worsening memory. MMSE in January 2017 was 18/30. He had been taking Aricept, Namenda was added on and he was given a prescription for Namzaric. It appears that on 04/26/16 while at his oncologist appointment, he started becoming unresponsive while sitting and was noted to be diaphoretic and pale. His pulse was noted to be down in the 20s for 5-10 minutes. He had urinary and bowel incontinence, no convulsive activity. His medications were adjusted and Aricept was discontinued due to cardiac concerns. He continues on Namenda 10mg  BID without side effects. They report that prior to his hospitalization, there were times where he would "go blank" like family is talking into space. No further spacing out since June. He has moved into an apartment and needs help with dressing and bathing, needing clothing changes 4-5 times a day due to incontinence. His wife is his primary caregiver. His daughter is his power of attorney and in charge of bill payments. His wife gives him his medications. His memory is unchanged, most of the time he is unaware he did something, such as throwing bowls in the trash or putting silverware back in the cabinet unwashed. He denies any headaches, dizziness, diplopia, dysarthria, dysphagia, neck pain, focal numbness/tingling/weakness, bowel/bladder dysfunction, anosmia, tremors. No falls. He is not driving. No personality changes.  HPI 03/11/15 : This is a pleasant 82 yo RH man with a history of hypertension, hyperlipidemia, low-grade myelodysplasia, refractory anemia, pernicious anemia, with worsening memory loss. He feels that his memory is pretty good. He states he does a  lot of work for the [employer] and denies having any difficulties with his duties there. He does note that he misplaces things and has had word-finding difficulties. He got lost driving 6 months ago but would not elaborate. He denied any missed bills or missed medications. His wife and daughter, on the other hand, report more concerning memory changes over the past couple of years. He would start in the middle of a conversation and she would have to ask what he is talking about. He has noticeable word-finding difficulties, and sometimes does not seem to understand conversations. His wife is concerned about his driving, they got lost 6 months ago and were out of 3 hours. He sometimes drives/drifts to the median, or sometimes comes to a complete stop. He would get upset with her if she comments. He frequently misplaces things, his cell phone, credit card, one time he did not remember where he parked, and they reported the car stolen until he came back the next day and found the car. He repeats himself. He has had some problems with late bill payments. They deny any personality changes. No family history of memory loss, no significant head injuries. He drinks wine daily and beer occasionally, reporting that this is less than before.   His family reported worsening memory, he got agitated why his family had not visited, when they in fact did. His family was there when he was loudly calling his wife's name at midnight and kept saying "lights." He showed her that he was talking about the TV, which she fixed for him. One time at 11pm his wife heard the garage door  open. She saw him coming back in, he said he was going to get the paper but saw it was raining hard and went back inside. She then woke up at 6am to find him dressed in front of the TV. He is able to dress and bathe independently, but his wife picks out his clothes, otherwise he would wear inappropriate clothing such as shorts in the winter. They have  to remind him to shower, otherwise he would not think to do it. His wife has been administering his medications for the past 2-3 weeks after he had fallen in his room and they found that he had taken 2 days worth of medication at the same time. His room was in disarray and he had urinated on himself.   PAST MEDICAL HISTORY: Past Medical History:  Diagnosis Date  . Adenomatous colon polyp 02/1996, 02/2011   TA polyp 02/2011  . Anemia in chronic renal disease 11/02/2011  . B12 deficiency   . BPH (benign prostatic hyperplasia)   . CAD (coronary artery disease)   . CVD (cardiovascular disease)   . Diverticulosis   . Esophageal stricture   . GERD (gastroesophageal reflux disease)   . Glaucoma   . Heart palpitations   . Hemorrhoids   . Hiatal hernia   . History of colonic polyps 09/29/2006   Polyps age 71, Dr. Fuller Plan stated no further colonoscopy due to age. Confirmed with daughter given alzheimers, CAD history    . Hyperlipidemia   . Hypertension   . MDS (myelodysplastic syndrome) ([place])     MEDICATIONS: Current Outpatient Prescriptions on File Prior to Visit  Medication Sig Dispense Refill  . aspirin 81 MG tablet Take 81 mg by mouth daily.      . [facility]S OMEGA-3 KRILL OIL PO Take 350 mg by mouth daily. Reported on 01/16/2016    . cyanocobalamin 100 MCG tablet Take by mouth daily.    . dorzolamide (TRUSOPT) 2 % ophthalmic solution Place 1 drop into both eyes 2 (two) times daily.      [name] doxazosin (CARDURA) 4 MG tablet Take 1 tablet by mouth at  bedtime 60 tablet 2  . lisinopril (PRINIVIL,ZESTRIL) 5 MG tablet Take 1 tablet (5 mg total) by mouth daily. 90 tablet 3  . LUMIGAN 0.01 % SOLN Place 1 drop into both eyes at bedtime.     . memantine (NAMENDA) 5 MG tablet Take 1 tablet (5 mg total) by mouth 2 (two) times daily. 180 tablet 1  . mirabegron ER (MYRBETRIQ) 50 MG TB24 tablet Take 50 mg by mouth daily.    . Multiple Vitamin (MULTIVITAMIN) tablet Take 1 tablet by mouth daily.      [name] omeprazole  (PRILOSEC) 20 MG capsule     . Probiotic Product ([facility]S PROBIOTIC) CHEW Chew by mouth daily.    . Pyridoxine HCl (VITAMIN B-6) 250 MG tablet Take 1 tablet (250 mg total) by mouth daily. 90 tablet 3  . simvastatin (ZOCOR) 40 MG tablet TAKE 1 TABLET BY MOUTH  DAILY AT 6 PM 90 tablet 1   Current Facility-Administered Medications on File Prior to Visit  Medication Dose Route Frequency Provider Last Rate Last Dose  . darbepoetin (ARANESP) injection 300 mcg  300 mcg Subcutaneous Once [name], MD        ALLERGIES: No Known Allergies  FAMILY HISTORY: Family History  Problem Relation Age of Onset  . COPD Mother   . Alcohol abuse Father     SOCIAL HISTORY: Social History  Social History  . Marital status: Married    Spouse name: N/A  . Number of children: 3  . Years of education: N/A   Occupational History  . Retired Retired   Social History Main Topics  . Smoking status: Former Smoker    Quit date: 11/19/1968  . Smokeless tobacco: Never Used     Comment: never used tobacco  . Alcohol use 4.2 oz/week    7 Standard drinks or equivalent per week     Comment: Wine/Beer  . Drug use: No  . Sexual activity: Not on file   Other Topics Concern  . Not on file   Social History Narrative   Married (wife patient of Dr. Yong Channel as well)      Retired from [employer] logistics/support/administrative officer and [place]      Hobbies: Scientist, research (physical sciences) [employer], [employer] .    REVIEW OF SYSTEMS: Constitutional: No fevers, chills, or sweats, no generalized fatigue, change in appetite Eyes: No visual changes, double vision, eye pain Ear, nose and throat: No hearing loss, ear pain, nasal congestion, sore throat Cardiovascular: No chest pain, palpitations Respiratory:  No shortness of breath at rest or with exertion, wheezes GastrointestinaI: No nausea, vomiting, diarrhea, abdominal pain, fecal incontinence Genitourinary:  No dysuria, urinary retention or frequency Musculoskeletal:  No neck pain, +back  pain Integumentary: No rash, pruritus, skin lesions Neurological: as above Psychiatric: No depression, insomnia, anxiety Endocrine: No palpitations, fatigue, diaphoresis, mood swings, change in appetite, change in weight, increased thirst Hematologic/Lymphatic:  No anemia, purpura, petechiae. Allergic/Immunologic: no itchy/runny eyes, nasal congestion, recent allergic reactions, rashes  PHYSICAL EXAM: Vitals:   08/21/16 1410  BP: (!) 156/72  Pulse: 80  Temp: 97.7 F (36.5 C)   General: No acute distress Head:  Normocephalic/atraumatic Neck: supple, no paraspinal tenderness, full range of motion Heart:  Regular rate and rhythm Lungs:  Clear to auscultation bilaterally Back: No paraspinal tenderness Skin/Extremities: No rash, no edema Neurological Exam: alert and oriented to person, place. No aphasia or dysarthria. Fund of knowledge is appropriate.  Remote memory intact. 0/3 delayed recall.  Attention and concentration are normal.    Able to name objects and repeat phrases. CDT 1/5 MMSE - Mini Mental State Exam 08/21/2016 12/12/2015 03/11/2015  Orientation to time 0 1 5  Orientation to Place 4 5 5   Registration 3 3 3   Attention/ Calculation 3 2 0  Recall 0 0 1  Language- name 2 objects 2 2 2   Language- repeat 1 1 0  Language- follow 3 step command 2 3 2   Language- read & follow direction 1 1 1   Write a sentence 1 0 1  Copy design 0 0 1  Total score 17 18 21    Cranial nerves: Pupils equal, round, reactive to light. Extraocular movements intact with no nystagmus. Visual fields full. Facial sensation intact. No facial asymmetry. Tongue, uvula, palate midline.  Motor: Bulk and tone normal, muscle strength 5/5 throughout with no pronator drift.  Sensation to light touch intact.  No extinction to double simultaneous stimulation.  Deep tendon reflexes brisk 2+ throughout, toes downgoing.  Finger to nose testing intact.  Gait slow and cautious, stooped posture, seems to favor the right leg  (similar to prior).  IMPRESSION: This is a pleasant 82 yo RH man with a history of hypertension, hyperlipidemia, myelodysplasia, anemia, lumbar stenosis, with worsening memory. MMSE today 17/30, indicating moderate dementia (18/30 in January 2017, 21/30 in April 2016). MRI brain unremarkable. He had syncope and bradycardia, and  Aricept was stopped. He is tolerating Namenda and will increase to 10mg  BID, refills sent today. We again discussed home safety and either getting more help at home, his wife feels she is capable of doing it by herself at this time. We again discussed the importance of physical exercise and brain stimulation exercises for brain health.He will follow-up in 8 months and knows to call our office for any changes in the interim.  Thank you for allowing me to participate in his care.  Please do not hesitate to call for any questions or concerns.  The duration of this appointment visit was 25 minutes of face-to-face time with the patient.  Greater than 50% of this time was spent in counseling, explanation of diagnosis, planning of further management, and coordination of care.   Alexander Rice, M.D.   CC: Dr. Yong Channel

## 2016-08-21 NOTE — Patient Instructions (Signed)
1. Increase Namenda to 10mg  twice a day. With your current bottle of Namenda 5mg , take 2 tablets twice a day. Your new bottle will be for Namenda 10mg , take 1 tablet twice a day 2. Continue physical exercise and brain stimulation exercises for brain health 3. Continue to monitor home safety and help at home, use cane at all times 4. Follow-up in 8 months, call for any changes

## 2016-09-07 ENCOUNTER — Telehealth: Payer: Self-pay | Admitting: Neurology

## 2016-09-07 MED ORDER — MEMANTINE HCL 10 MG PO TABS: 10.0000 mg | ORAL_TABLET | Freq: Two times a day (BID) | ORAL | 5 refills | Status: DC

## 2016-09-07 NOTE — Telephone Encounter (Signed)
Notified patient's daughter RX sent to pharmacy.

## 2016-09-07 NOTE — Telephone Encounter (Signed)
RX sent to Pharmacy

## 2016-09-07 NOTE — Telephone Encounter (Signed)
Yes, pls send in, thanks!

## 2016-09-07 NOTE — Telephone Encounter (Signed)
Alexander Rice 09/05/1932. His daughter called regarding her dads medication ( Namenda ). Doctor Delice Lesch was to double his dosage from 5 MG TO 10 MG. His daughter said she contacted [facility] but they have not received an order. His daughter's # is [phone]. Thank you

## 2016-09-07 NOTE — Telephone Encounter (Signed)
Patient was last seen 10/03. Your note states to finish bottle of Namenda 5mg  (2 tabs) twice a day until bottle is finished and then his new bottle would be Namenda 10mg  twice a day. Is this okay to send in? Thank you.

## 2016-09-10 ENCOUNTER — Encounter: Payer: Self-pay | Admitting: Family

## 2016-09-10 ENCOUNTER — Ambulatory Visit (HOSPITAL_BASED_OUTPATIENT_CLINIC_OR_DEPARTMENT_OTHER): Payer: Medicare Other

## 2016-09-10 ENCOUNTER — Other Ambulatory Visit (HOSPITAL_BASED_OUTPATIENT_CLINIC_OR_DEPARTMENT_OTHER): Payer: Medicare Other

## 2016-09-10 ENCOUNTER — Ambulatory Visit (HOSPITAL_BASED_OUTPATIENT_CLINIC_OR_DEPARTMENT_OTHER): Payer: Medicare Other | Admitting: Family

## 2016-09-10 VITALS — BP 170/50 | HR 70 | Temp 97.6°F | Resp 16 | Ht 68.0 in | Wt 163.0 lb

## 2016-09-10 DIAGNOSIS — D631 Anemia in chronic kidney disease: Secondary | ICD-10-CM | POA: Diagnosis not present

## 2016-09-10 DIAGNOSIS — D469 Myelodysplastic syndrome, unspecified: Secondary | ICD-10-CM

## 2016-09-10 DIAGNOSIS — D510 Vitamin B12 deficiency anemia due to intrinsic factor deficiency: Secondary | ICD-10-CM

## 2016-09-10 DIAGNOSIS — N189 Chronic kidney disease, unspecified: Secondary | ICD-10-CM | POA: Diagnosis not present

## 2016-09-10 DIAGNOSIS — D461 Refractory anemia with ring sideroblasts: Secondary | ICD-10-CM | POA: Diagnosis not present

## 2016-09-10 DIAGNOSIS — D630 Anemia in neoplastic disease: Secondary | ICD-10-CM

## 2016-09-10 LAB — CBC WITH DIFFERENTIAL (CANCER CENTER ONLY)
BASO#: 0 10*3/uL (ref 0.0–0.2)
BASO%: 0.4 % (ref 0.0–2.0)
EOS ABS: 0.1 10*3/uL (ref 0.0–0.5)
EOS%: 2.5 % (ref 0.0–7.0)
HEMATOCRIT: 24.2 % — AB (ref 38.7–49.9)
HGB: 8.5 g/dL — ABNORMAL LOW (ref 13.0–17.1)
LYMPH#: 1.6 10*3/uL (ref 0.9–3.3)
LYMPH%: 32.6 % (ref 14.0–48.0)
MCH: 34.4 pg — ABNORMAL HIGH (ref 28.0–33.4)
MCHC: 35.1 g/dL (ref 32.0–35.9)
MCV: 98 fL (ref 82–98)
MONO#: 0.4 10*3/uL (ref 0.1–0.9)
MONO%: 8.9 % (ref 0.0–13.0)
NEUT#: 2.7 10*3/uL (ref 1.5–6.5)
NEUT%: 55.6 % (ref 40.0–80.0)
Platelets: 476 10*3/uL — ABNORMAL HIGH (ref 145–400)
RBC: 2.47 10*6/uL — ABNORMAL LOW (ref 4.20–5.70)
RDW: 18.5 % — AB (ref 11.1–15.7)
WBC: 4.8 10*3/uL (ref 4.0–10.0)

## 2016-09-10 MED ORDER — EPOETIN ALFA 40000 UNIT/ML IJ SOLN
40000.0000 [IU] | Freq: Once | INTRAMUSCULAR | Status: AC
  Administered 2016-09-10: 40000 [IU] via SUBCUTANEOUS

## 2016-09-10 MED ORDER — EPOETIN ALFA 40000 UNIT/ML IJ SOLN
INTRAMUSCULAR | Status: AC
  Filled 2016-09-10: qty 1

## 2016-09-10 NOTE — Patient Instructions (Signed)
Epoetin Alfa injection What is this medicine? EPOETIN ALFA (e POE e tin AL fa) helps your body make more red blood cells. This medicine is used to treat anemia caused by chronic kidney failure, cancer chemotherapy, or HIV-therapy. It may also be used before surgery if you have anemia. This medicine may be used for other purposes; ask your health care provider or pharmacist if you have questions. What should I tell my health care provider before I take this medicine? They need to know if you have any of these conditions: -blood clotting disorders -cancer patient not on chemotherapy -cystic fibrosis -heart disease, such as angina or heart failure -hemoglobin level of 12 g/dL or greater -high blood pressure -low levels of folate, iron, or vitamin B12 -seizures -an unusual or allergic reaction to erythropoietin, albumin, benzyl alcohol, hamster proteins, other medicines, foods, dyes, or preservatives -pregnant or trying to get pregnant -breast-feeding How should I use this medicine? This medicine is for injection into a vein or under the skin. It is usually given by a health care professional in a hospital or clinic setting. If you get this medicine at home, you will be taught how to prepare and give this medicine. Use exactly as directed. Take your medicine at regular intervals. Do not take your medicine more often than directed. It is important that you put your used needles and syringes in a special sharps container. Do not put them in a trash can. If you do not have a sharps container, call your pharmacist or healthcare provider to get one. Talk to your pediatrician regarding the use of this medicine in children. While this drug may be prescribed for selected conditions, precautions do apply. Overdosage: If you think you have taken too much of this medicine contact a poison control center or emergency room at once. NOTE: This medicine is only for you. Do not share this medicine with  others. What if I miss a dose? If you miss a dose, take it as soon as you can. If it is almost time for your next dose, take only that dose. Do not take double or extra doses. What may interact with this medicine? Do not take this medicine with any of the following medications: -darbepoetin alfa This list may not describe all possible interactions. Give your health care provider a list of all the medicines, herbs, non-prescription drugs, or dietary supplements you use. Also tell them if you smoke, drink alcohol, or use illegal drugs. Some items may interact with your medicine. What should I watch for while using this medicine? Visit your prescriber or health care professional for regular checks on your progress and for the needed blood tests and blood pressure measurements. It is especially important for the doctor to make sure your hemoglobin level is in the desired range, to limit the risk of potential side effects and to give you the best benefit. Keep all appointments for any recommended tests. Check your blood pressure as directed. Ask your doctor what your blood pressure should be and when you should contact him or her. As your body makes more red blood cells, you may need to take iron, folic acid, or vitamin B supplements. Ask your doctor or health care provider which products are right for you. If you have kidney disease continue dietary restrictions, even though this medication can make you feel better. Talk with your doctor or health care professional about the foods you eat and the vitamins that you take. What side effects may I notice   from receiving this medicine? Side effects that you should report to your doctor or health care professional as soon as possible: -allergic reactions like skin rash, itching or hives, swelling of the face, lips, or tongue -breathing problems -changes in vision -chest pain -confusion, trouble speaking or understanding -feeling faint or lightheaded,  falls -high blood pressure -muscle aches or pains -pain, swelling, warmth in the leg -rapid weight gain -severe headaches -sudden numbness or weakness of the face, arm or leg -trouble walking, dizziness, loss of balance or coordination -seizures (convulsions) -swelling of the ankles, feet, hands -unusually weak or tired Side effects that usually do not require medical attention (report to your doctor or health care professional if they continue or are bothersome): -diarrhea -fever, chills (flu-like symptoms) -headaches -nausea, vomiting -redness, stinging, or swelling at site where injected This list may not describe all possible side effects. Call your doctor for medical advice about side effects. You may report side effects to FDA at 1-800-FDA-1088. Where should I keep my medicine? Keep out of the reach of children. Store in a refrigerator between 2 and 8 degrees C (36 and 46 degrees F). Do not freeze or shake. Throw away any unused portion if using a single-dose vial. Multi-dose vials can be kept in the refrigerator for up to 21 days after the initial dose. Throw away unused medicine. NOTE: This sheet is a summary. It may not cover all possible information. If you have questions about this medicine, talk to your doctor, pharmacist, or health care provider.    2016, [employer] Standard. (2008-10-19 10:25:44)  

## 2016-09-10 NOTE — Progress Notes (Signed)
Hematology and Oncology Follow Up Visit  Alexander Rice [id number] 07/16/1932 82 y.o. 09/10/2016   Principle Diagnosis:  Anemia of renal insufficiency  Refractory anemia with ringed sideroblasts. Pernicious anemia  Current Therapy:   Procrit 40,000 units subcutaneous monthly Vitamin B6 250 mg by mouth daily Blood transfusions as indicated - last transfusion on 07/31/2016    Interim History: Alexander Rice is here today with his wife and daughter for a follow-up. He is doing well and has no complaints at this time.  He has switched from using a cane to a walker and is able to get around much easier. He is walking laps in his breezeway at home for exercise. He has had no falls or syncopal episodes.  I rechecked his BP manually and it was 135/55.  No fever, chills, n/v, cough, rash, headache, dizziness, vision changes, chest pain, palpitations, abdominal pain or changes in bowel or bladder habits. He continues to be incontinent of both stool and urine at times. No swelling, tenderness, numbness or tingling in his extremities. No c/o joint aches or bone pain.  No lymphadenopathy found on exam.  He has maintained a good appetite and is staying well hydrated. His weight is stable.    Medications:    Medication List       Accurate as of 09/10/16  2:31 PM. Always use your most recent med list.          aspirin 81 MG tablet Take 81 mg by mouth daily.   [facility] OMEGA-3 KRILL OIL PO Take 350 mg by mouth daily. Reported on 01/16/2016   [facility] PROBIOTIC Chew Chew by mouth daily.   cyanocobalamin 100 MCG tablet Take by mouth daily.   dorzolamide 2 % ophthalmic solution Commonly known as:  TRUSOPT Place 1 drop into both eyes 2 (two) times daily.   doxazosin 4 MG tablet Commonly known as:  CARDURA Take 1 tablet by mouth at  bedtime   lisinopril 5 MG tablet Commonly known as:  PRINIVIL,ZESTRIL Take 1 tablet (5 mg total) by mouth daily.   LUMIGAN 0.01 % Soln Generic drug:   bimatoprost Place 1 drop into both eyes at bedtime.   memantine 10 MG tablet Commonly known as:  NAMENDA Take 1 tablet (10 mg total) by mouth 2 (two) times daily.   multivitamin tablet Take 1 tablet by mouth daily.   MYRBETRIQ 50 MG Tb24 tablet Generic drug:  mirabegron ER Take 50 mg by mouth daily.   omeprazole 20 MG capsule Commonly known as:  PRILOSEC   simvastatin 40 MG tablet Commonly known as:  ZOCOR TAKE 1 TABLET BY MOUTH  DAILY AT 6 PM   vitamin B-6 250 MG tablet Take 1 tablet (250 mg total) by mouth daily.       Allergies: No Known Allergies  Past Medical History, Surgical history, Social history, and Family History were reviewed and updated.  Review of Systems: All other 10 point review of systems is negative.   Physical Exam:  height is 5\' 8"  (1.727 m) and weight is 163 lb (73.9 kg). His oral temperature is 97.6 F (36.4 C). His blood pressure is 170/50 (abnormal) and his pulse is 70. His respiration is 16.   Wt Readings from Last 3 Encounters:  09/10/16 163 lb (73.9 kg)  08/21/16 169 lb 4 oz (76.8 kg)  08/20/16 169 lb (76.7 kg)    Ocular: Sclerae unicteric, pupils equal, round and reactive to light Ear-nose-throat: Oropharynx clear, dentition fair Lymphatic: No cervical supraclavicular or  axillary adenopathy Lungs no rales or rhonchi, good excursion bilaterally Heart regular rate and rhythm, no murmur appreciated Abd soft, nontender, positive bowel sounds, no liver or spleen tip palpated on exam MSK no focal spinal tenderness, no joint edema Neuro: non-focal, well-oriented, appropriate affect Breast: Deferred   Lab Results  Component Value Date   WBC 4.8 09/10/2016   HGB 8.5 (L) 09/10/2016   HCT 24.2 (L) 09/10/2016   MCV 98 09/10/2016   PLT 476 (H) 09/10/2016   Lab Results  Component Value Date   FERRITIN 1,785 (H) 08/20/2016   IRON 64 08/20/2016   TIBC 193 (L) 08/20/2016   UIBC 129 08/20/2016   IRONPCTSAT 33 08/20/2016   Lab Results    Component Value Date   RETICCTPCT 1.0 11/25/2015   RBC 2.47 (L) 09/10/2016   RETICCTABS 27.2 11/25/2015   Lab Results  Component Value Date   KPAFRELGTCHN 3.09 (H) 04/22/2008   LAMBDASER 1.01 04/22/2008   KAPLAMBRATIO 3.06 (H) 04/22/2008   No results found for: Kandis Cocking, IGMSERUM Lab Results  Component Value Date   TOTALPROTELP 6.3 04/22/2008   ALBUMINELP 66.3 (H) 04/22/2008   A1GS 3.9 04/22/2008   A2GS 8.1 04/22/2008   BETS 5.2 04/22/2008   BETA2SER 4.0 04/22/2008   GAMS 12.5 04/22/2008   MSPIKE NOT DET 04/22/2008   SPEI * 04/22/2008     Chemistry      Component Value Date/Time   NA 127 (L) 08/20/2016 1453   NA 131 (L) 07/30/2016 1039   K 4.4 08/20/2016 1453   K 4.4 07/30/2016 1039   CL 100 08/20/2016 1453   CO2 24 08/20/2016 1453   CO2 21 (L) 07/30/2016 1039   BUN 23 (H) 08/20/2016 1453   BUN 20.3 07/30/2016 1039   CREATININE 1.4 (H) 08/20/2016 1453   CREATININE 0.9 07/30/2016 1039      Component Value Date/Time   CALCIUM 8.5 08/20/2016 1453   CALCIUM 8.7 07/30/2016 1039   ALKPHOS 65 08/20/2016 1453   ALKPHOS 47 07/30/2016 1039   AST 39 (H) 08/20/2016 1453   AST 17 07/30/2016 1039   ALT 61 (H) 08/20/2016 1453   ALT 18 07/30/2016 1039   BILITOT 0.90 08/20/2016 1453   BILITOT 0.93 07/30/2016 1039     Impression and Plan: Alexander Rice is an 82 yo white male with myelodysplasia and refractory anemia with ringed sideroblasts. He has responded nicely to Procrit. His Hgb is slowly improving and is now 8.5.  We will hold off on transfusing him at this time. He will receive procrit today.  We will plan to see him back again in 3 weeks for repeat lab work and follow-up.  Both his wife and daughter know to contact us with any questions or concerns. We can certainly see him sooner if need be.   Eliezer Bottom, NP 10/23/20172:31 PM

## 2016-09-11 ENCOUNTER — Telehealth: Payer: Self-pay | Admitting: Family Medicine

## 2016-09-11 LAB — RETICULOCYTES: RETICULOCYTE COUNT: 0.7 % (ref 0.6–2.6)

## 2016-09-11 LAB — IRON AND TIBC
%SAT: >100 % (ref 20–?)
IRON: 193 ug/dL — AB (ref 42–163)
TIBC: 188 ug/dL — AB (ref 202–409)
UIBC: <1 ug/dL (ref 117–376)

## 2016-09-11 LAB — FERRITIN: Ferritin: 990 ng/ml — ABNORMAL HIGH (ref 22–316)

## 2016-09-11 NOTE — Telephone Encounter (Signed)
Hello Dr. Yong Channel . This is Richardo Priest. This message is regarding my father Alexander Rice and I have not set up his my chart yet. His DOB is 05/27/1932. I thought it would be easier to relay this information and this format versus a phone message. You had requested that I follow up with you to let you know what his last two blood pressure readings were from his last two visits with Dr Marin Olp . On October 2 his blood pressure reading was 152 /50 and yesterday on 10/23 his BP was 135/55. It seems the increase from 2.5 to 5 for the lisinopril is working .    Thank you,    Curt Bears

## 2016-09-20 ENCOUNTER — Ambulatory Visit (INDEPENDENT_AMBULATORY_CARE_PROVIDER_SITE_OTHER): Payer: Medicare Other | Admitting: Podiatry

## 2016-09-20 ENCOUNTER — Encounter: Payer: Self-pay | Admitting: Podiatry

## 2016-09-20 VITALS — Ht 68.0 in | Wt 163.0 lb

## 2016-09-20 DIAGNOSIS — M79675 Pain in left toe(s): Secondary | ICD-10-CM | POA: Diagnosis not present

## 2016-09-20 DIAGNOSIS — B351 Tinea unguium: Secondary | ICD-10-CM

## 2016-09-20 DIAGNOSIS — I739 Peripheral vascular disease, unspecified: Secondary | ICD-10-CM

## 2016-09-20 NOTE — Progress Notes (Signed)
Patient ID: Alexander Rice, male   DOB: 10-18-1932, 82 y.o.   MRN: [id number] HPI  Complaint:  Visit Type: Patient returns to my office for continued preventative foot care services. Complaint: Patient states" my nails have grown long and thick and become painful to walk and wear shoes. He presents for preventative foot care services. No changes to ROS  Podiatric Exam: Vascular: dorsalis pedis and posterior tibial pulses are negative. Capillary return is diminished.. Temperature gradient is negative. Skin turgor WNL,   Sensorium: Normal Semmes Weinstein monofilament test. Normal tactile sensation bilaterally.  Nail Exam: Pt has thick disfigured discolored nails with subungual debris noted bilateral entire nail hallux  Ulcer Exam: There is no evidence of ulcer or pre-ulcerative changes or infection. Orthopedic Exam: Muscle tone and strength are WNL. No limitations in general ROM. No crepitus or effusions noted. Foot type and digits show no abnormalities. Bony prominences are unremarkable. Skin: No Porokeratosis. No infection or ulcers  Diagnosis:  Tinea unguium, Pain in right toe, pain in left toes  Treatment & Plan Procedures and Treatment: Consent by patient was obtained for treatment procedures. The patient understood the discussion of treatment and procedures well. All questions were answered thoroughly reviewed. Debridement of mycotic and hypertrophic toenails, 1 through 5 bilateral and clearing of subungual debris. No ulceration, no infection noted.  Return Visit-Office Procedure: Patient instructed to return to the office for a follow up visit 3 months for continued evaluation and treatment.   Gardiner Barefoot DPM

## 2016-10-01 ENCOUNTER — Other Ambulatory Visit (HOSPITAL_BASED_OUTPATIENT_CLINIC_OR_DEPARTMENT_OTHER): Payer: Medicare Other

## 2016-10-01 ENCOUNTER — Ambulatory Visit (HOSPITAL_BASED_OUTPATIENT_CLINIC_OR_DEPARTMENT_OTHER): Payer: Medicare Other

## 2016-10-01 ENCOUNTER — Ambulatory Visit (HOSPITAL_BASED_OUTPATIENT_CLINIC_OR_DEPARTMENT_OTHER): Payer: Medicare Other | Admitting: Family

## 2016-10-01 VITALS — BP 171/51 | HR 72 | Temp 96.0°F | Wt 162.8 lb

## 2016-10-01 DIAGNOSIS — N183 Chronic kidney disease, stage 3 (moderate): Secondary | ICD-10-CM

## 2016-10-01 DIAGNOSIS — D461 Refractory anemia with ring sideroblasts: Secondary | ICD-10-CM

## 2016-10-01 DIAGNOSIS — D631 Anemia in chronic kidney disease: Secondary | ICD-10-CM

## 2016-10-01 DIAGNOSIS — N189 Chronic kidney disease, unspecified: Secondary | ICD-10-CM | POA: Diagnosis not present

## 2016-10-01 DIAGNOSIS — D510 Vitamin B12 deficiency anemia due to intrinsic factor deficiency: Secondary | ICD-10-CM | POA: Diagnosis not present

## 2016-10-01 DIAGNOSIS — D469 Myelodysplastic syndrome, unspecified: Secondary | ICD-10-CM | POA: Diagnosis not present

## 2016-10-01 DIAGNOSIS — D630 Anemia in neoplastic disease: Secondary | ICD-10-CM

## 2016-10-01 LAB — CBC WITH DIFFERENTIAL (CANCER CENTER ONLY)
BASO#: 0 10*3/uL (ref 0.0–0.2)
BASO%: 0.4 % (ref 0.0–2.0)
EOS ABS: 0.1 10*3/uL (ref 0.0–0.5)
EOS%: 2.9 % (ref 0.0–7.0)
HEMATOCRIT: 24 % — AB (ref 38.7–49.9)
HGB: 8.4 g/dL — ABNORMAL LOW (ref 13.0–17.1)
LYMPH#: 1.8 10*3/uL (ref 0.9–3.3)
LYMPH%: 39.1 % (ref 14.0–48.0)
MCH: 34.1 pg — AB (ref 28.0–33.4)
MCHC: 35 g/dL (ref 32.0–35.9)
MCV: 98 fL (ref 82–98)
MONO#: 0.4 10*3/uL (ref 0.1–0.9)
MONO%: 9.6 % (ref 0.0–13.0)
NEUT#: 2.2 10*3/uL (ref 1.5–6.5)
NEUT%: 48 % (ref 40.0–80.0)
Platelets: 441 10*3/uL — ABNORMAL HIGH (ref 145–400)
RBC: 2.46 10*6/uL — AB (ref 4.20–5.70)
RDW: 18.7 % — ABNORMAL HIGH (ref 11.1–15.7)
WBC: 4.5 10*3/uL (ref 4.0–10.0)

## 2016-10-01 MED ORDER — EPOETIN ALFA 40000 UNIT/ML IJ SOLN
40000.0000 [IU] | Freq: Once | INTRAMUSCULAR | Status: AC
  Administered 2016-10-01: 40000 [IU] via SUBCUTANEOUS

## 2016-10-01 MED ORDER — EPOETIN ALFA 40000 UNIT/ML IJ SOLN
INTRAMUSCULAR | Status: AC
  Filled 2016-10-01: qty 1

## 2016-10-01 NOTE — Progress Notes (Signed)
Hematology and Oncology Follow Up Visit  Alexander Rice [id number] 1932/11/12 82 y.o. 10/01/2016   Principle Diagnosis:  Anemia of renal insufficiency  Refractory anemia with ringed sideroblasts. Pernicious anemia  Current Therapy:   Procrit 40,000 units subcutaneous every 3 weeks Vitamin B6 250 mg by mouth daily Blood transfusions as indicated - last transfusion on 07/31/2016    Interim History: Alexander Rice is here today with his wife and daughter for a follow-up. He continues to do well and has no complaints at this time.  He is ambulating with his cane or walker but is still a little unsteady on his feet. He has had no falls or syncopal episodes.  He is eating well and staying hydrated. His weight is unchanged.  No fever, chills, n/v, cough, rash, headache, dizziness, vision changes, chest pain, palpitations, abdominal pain or changes in bowel or bladder habits. He continues to be incontinent of both stool and urine at times. No swelling, tenderness, numbness or tingling in his extremities. No new aches or pains.  No lymphadenopathy found on exam. No episodes of bleeding or bruising.   Medications:    Medication List       Accurate as of 10/01/16  2:26 PM. Always use your most recent med list.          aspirin 81 MG tablet Take 81 mg by mouth daily.   [facility]S OMEGA-3 KRILL OIL PO Take 350 mg by mouth daily. Reported on 01/16/2016   [facility] PROBIOTIC Chew Chew by mouth daily.   cyanocobalamin 100 MCG tablet Take by mouth daily.   dorzolamide 2 % ophthalmic solution Commonly known as:  TRUSOPT Place 1 drop into both eyes 2 (two) times daily.   doxazosin 4 MG tablet Commonly known as:  CARDURA Take 1 tablet by mouth at  bedtime   lisinopril 5 MG tablet Commonly known as:  PRINIVIL,ZESTRIL Take 1 tablet (5 mg total) by mouth daily.   LUMIGAN 0.01 % Soln Generic drug:  bimatoprost Place 1 drop into both eyes at bedtime.   memantine 10 MG tablet Commonly  known as:  NAMENDA Take 1 tablet (10 mg total) by mouth 2 (two) times daily.   multivitamin tablet Take 1 tablet by mouth daily.   MYRBETRIQ 50 MG Tb24 tablet Generic drug:  mirabegron ER Take 50 mg by mouth daily.   omeprazole 20 MG capsule Commonly known as:  PRILOSEC   simvastatin 40 MG tablet Commonly known as:  ZOCOR TAKE 1 TABLET BY MOUTH  DAILY AT 6 PM   vitamin B-6 250 MG tablet Take 1 tablet (250 mg total) by mouth daily.       Allergies: No Known Allergies  Past Medical History, Surgical history, Social history, and Family History were reviewed and updated.  Review of Systems: All other 10 point review of systems is negative.   Physical Exam:  vitals were not taken for this visit.  Wt Readings from Last 3 Encounters:  09/20/16 163 lb (73.9 kg)  09/10/16 163 lb (73.9 kg)  08/21/16 169 lb 4 oz (76.8 kg)    Ocular: Sclerae unicteric, pupils equal, round and reactive to light Ear-nose-throat: Oropharynx clear, dentition fair Lymphatic: No cervical supraclavicular or axillary adenopathy Lungs no rales or rhonchi, good excursion bilaterally Heart regular rate and rhythm, no murmur appreciated Abd soft, nontender, positive bowel sounds, no liver or spleen tip palpated on exam, no fluid wave MSK no focal spinal tenderness, no joint edema Neuro: non-focal, well-oriented, appropriate affect Breast: Deferred  Lab Results  Component Value Date   WBC 4.5 10/01/2016   HGB 8.4 (L) 10/01/2016   HCT 24.0 (L) 10/01/2016   MCV 98 10/01/2016   PLT 441 (H) 10/01/2016   Lab Results  Component Value Date   FERRITIN 990 (H) 09/10/2016   IRON 193 (H) 09/10/2016   TIBC 188 (L) 09/10/2016   UIBC <1.0 09/10/2016   IRONPCTSAT >100 09/10/2016   Lab Results  Component Value Date   RETICCTPCT 1.0 11/25/2015   RBC 2.46 (L) 10/01/2016   RETICCTABS 27.2 11/25/2015   Lab Results  Component Value Date   KPAFRELGTCHN 3.09 (H) 04/22/2008   LAMBDASER 1.01 04/22/2008    KAPLAMBRATIO 3.06 (H) 04/22/2008   No results found for: Kandis Cocking, IGMSERUM Lab Results  Component Value Date   TOTALPROTELP 6.3 04/22/2008   ALBUMINELP 66.3 (H) 04/22/2008   A1GS 3.9 04/22/2008   A2GS 8.1 04/22/2008   BETS 5.2 04/22/2008   BETA2SER 4.0 04/22/2008   GAMS 12.5 04/22/2008   MSPIKE NOT DET 04/22/2008   SPEI * 04/22/2008     Chemistry      Component Value Date/Time   NA 127 (L) 08/20/2016 1453   NA 131 (L) 07/30/2016 1039   K 4.4 08/20/2016 1453   K 4.4 07/30/2016 1039   CL 100 08/20/2016 1453   CO2 24 08/20/2016 1453   CO2 21 (L) 07/30/2016 1039   BUN 23 (H) 08/20/2016 1453   BUN 20.3 07/30/2016 1039   CREATININE 1.4 (H) 08/20/2016 1453   CREATININE 0.9 07/30/2016 1039      Component Value Date/Time   CALCIUM 8.5 08/20/2016 1453   CALCIUM 8.7 07/30/2016 1039   ALKPHOS 65 08/20/2016 1453   ALKPHOS 47 07/30/2016 1039   AST 39 (H) 08/20/2016 1453   AST 17 07/30/2016 1039   ALT 61 (H) 08/20/2016 1453   ALT 18 07/30/2016 1039   BILITOT 0.90 08/20/2016 1453   BILITOT 0.93 07/30/2016 1039     Impression and Plan: Mr. Metzinger is an 82 yo white male with myelodysplasia and refractory anemia with ringed sideroblasts. He has responded nicely on Procrit. His Hgb is staying stable at 8.4 with an MCV of 98. No episodes of bleeding.  He is asymptomatic at this time and looking forward to Thanksgiving with his family.  He will get procrit today as planned.  We will plan to see him back again in 3 weeks for repeat lab work, follow-up and injection.  Both his wife and daughter know to contact us with any questions or concerns. We can certainly see him sooner if need be.   Eliezer Bottom, NP 11/13/20172:26 PM

## 2016-10-02 LAB — IRON AND TIBC
%SAT: 80 % — ABNORMAL HIGH (ref 20–55)
Iron: 159 ug/dL (ref 42–163)
TIBC: 198 ug/dL — ABNORMAL LOW (ref 202–409)
UIBC: 39 ug/dL — AB (ref 117–376)

## 2016-10-02 LAB — FERRITIN: Ferritin: 1078 ng/ml — ABNORMAL HIGH (ref 22–316)

## 2016-10-02 LAB — RETICULOCYTES: Reticulocyte Count: 0.6 % (ref 0.6–2.6)

## 2016-10-10 DIAGNOSIS — D485 Neoplasm of uncertain behavior of skin: Secondary | ICD-10-CM | POA: Diagnosis not present

## 2016-10-10 DIAGNOSIS — C4442 Squamous cell carcinoma of skin of scalp and neck: Secondary | ICD-10-CM | POA: Diagnosis not present

## 2016-10-10 DIAGNOSIS — L218 Other seborrheic dermatitis: Secondary | ICD-10-CM | POA: Diagnosis not present

## 2016-10-19 ENCOUNTER — Other Ambulatory Visit: Payer: Self-pay | Admitting: Family Medicine

## 2016-10-22 ENCOUNTER — Other Ambulatory Visit (HOSPITAL_BASED_OUTPATIENT_CLINIC_OR_DEPARTMENT_OTHER): Payer: Medicare Other

## 2016-10-22 ENCOUNTER — Encounter: Payer: Self-pay | Admitting: Hematology & Oncology

## 2016-10-22 ENCOUNTER — Ambulatory Visit (HOSPITAL_BASED_OUTPATIENT_CLINIC_OR_DEPARTMENT_OTHER): Payer: Medicare Other | Admitting: Hematology & Oncology

## 2016-10-22 ENCOUNTER — Ambulatory Visit (HOSPITAL_BASED_OUTPATIENT_CLINIC_OR_DEPARTMENT_OTHER): Payer: Medicare Other

## 2016-10-22 VITALS — BP 147/60 | HR 72 | Temp 95.0°F | Wt 166.8 lb

## 2016-10-22 DIAGNOSIS — N183 Chronic kidney disease, stage 3 unspecified: Secondary | ICD-10-CM

## 2016-10-22 DIAGNOSIS — D510 Vitamin B12 deficiency anemia due to intrinsic factor deficiency: Secondary | ICD-10-CM

## 2016-10-22 DIAGNOSIS — D469 Myelodysplastic syndrome, unspecified: Secondary | ICD-10-CM | POA: Diagnosis not present

## 2016-10-22 DIAGNOSIS — D631 Anemia in chronic kidney disease: Secondary | ICD-10-CM

## 2016-10-22 DIAGNOSIS — T814XXA Infection following a procedure, initial encounter: Secondary | ICD-10-CM

## 2016-10-22 DIAGNOSIS — D461 Refractory anemia with ring sideroblasts: Secondary | ICD-10-CM | POA: Diagnosis not present

## 2016-10-22 DIAGNOSIS — N189 Chronic kidney disease, unspecified: Secondary | ICD-10-CM

## 2016-10-22 DIAGNOSIS — M1990 Unspecified osteoarthritis, unspecified site: Secondary | ICD-10-CM | POA: Diagnosis not present

## 2016-10-22 DIAGNOSIS — IMO0001 Reserved for inherently not codable concepts without codable children: Secondary | ICD-10-CM

## 2016-10-22 LAB — CBC WITH DIFFERENTIAL (CANCER CENTER ONLY)
BASO#: 0 10*3/uL (ref 0.0–0.2)
BASO%: 0.2 % (ref 0.0–2.0)
EOS ABS: 0.2 10*3/uL (ref 0.0–0.5)
EOS%: 3.7 % (ref 0.0–7.0)
HCT: 24.7 % — ABNORMAL LOW (ref 38.7–49.9)
HEMOGLOBIN: 8.4 g/dL — AB (ref 13.0–17.1)
LYMPH#: 1.8 10*3/uL (ref 0.9–3.3)
LYMPH%: 33.8 % (ref 14.0–48.0)
MCH: 34 pg — AB (ref 28.0–33.4)
MCHC: 34 g/dL (ref 32.0–35.9)
MCV: 100 fL — AB (ref 82–98)
MONO#: 0.4 10*3/uL (ref 0.1–0.9)
MONO%: 7.4 % (ref 0.0–13.0)
NEUT%: 54.9 % (ref 40.0–80.0)
NEUTROS ABS: 3 10*3/uL (ref 1.5–6.5)
Platelets: 489 10*3/uL — ABNORMAL HIGH (ref 145–400)
RBC: 2.47 10*6/uL — AB (ref 4.20–5.70)
RDW: 18.8 % — ABNORMAL HIGH (ref 11.1–15.7)
WBC: 5.4 10*3/uL (ref 4.0–10.0)

## 2016-10-22 LAB — FERRITIN

## 2016-10-22 LAB — IRON AND TIBC
%SAT: 94 % — AB (ref 20–55)
Iron: 189 ug/dL — ABNORMAL HIGH (ref 42–163)
TIBC: 201 ug/dL — ABNORMAL LOW (ref 202–409)
UIBC: 12 ug/dL — AB (ref 117–376)

## 2016-10-22 MED ORDER — EPOETIN ALFA 40000 UNIT/ML IJ SOLN
40000.0000 [IU] | Freq: Once | INTRAMUSCULAR | Status: AC
  Administered 2016-10-22: 40000 [IU] via SUBCUTANEOUS

## 2016-10-22 MED ORDER — EPOETIN ALFA 40000 UNIT/ML IJ SOLN
INTRAMUSCULAR | Status: AC
Start: 2016-10-22 — End: 2016-10-22
  Filled 2016-10-22: qty 1

## 2016-10-22 NOTE — Progress Notes (Signed)
As well as bone or  Hematology and Oncology Follow Up Visit  Alexander Rice [id number] Apr 15, 1932 82 y.o. 10/22/2016   Principle Diagnosis:   Refractory anemia with ringed sideroblasts.  Pernicious anemia  Severe osteoarthritis  Current Therapy:    Procrit 40,000 units subcutaneous every 3 weeks  Vitamin B6 250 mg by mouth daily  Vitamin B-12 1 mg IM every month-done at home  Blood transfusions as indicated-last transfusion on 07/31/2016     Interim History:  Alexander Rice is back for follow-up.  He is doing okay. He comes in with his wife and daughter. They are doing okay. It is actually his wife's birthday today.  He is doing okay. 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 The incision site looks a little infected area and I went ahead and took a culture. I cleaned it and put some bacitracin on it. If there is any infection, we will get him on antibiotics.   His dementia is really his biggest problem. He really has very little memory. He is very pleasant. I know that he tries to remember but he just can't.   His eye studies today [place]ritin [place] 10471. His iron saturation is 94%.   He has had no obvious bleeding.   Looks like he did have a nice Thanksgiving.   He does have bad arthritis. He uses a cane to get around. He has some bad osteoporosis.   Overall, his performance status is ECOG 3.  Medications:  Current Outpatient Prescriptions:  .  aspirin 81 MG tablet, Take 81 mg by mouth daily.  , Disp: , Rfl:  .  CVS OMEGA-3 KRILL OIL PO, Take 350 mg by mouth daily. Reported on 01/16/2016, Disp: , Rfl:  .  cyanocobalamin 100 MCG tablet, Take by mouth daily., Disp: , Rfl:  .  dorzolamide (TRUSOPT) 2 % ophthalmic solution, Place 1 drop into both eyes 2 (two) times daily.  , Disp: , Rfl:  .  doxazosin (CARDURA) 4 MG tablet, Take 1 tablet by mouth at  bedtime, Disp: 60 tablet, Rfl: 2 .  lisinopril (PRINIVIL,ZESTRIL) 5 MG tablet, Take 1 tablet  (5 mg total) by mouth daily., Disp: 90 tablet, Rfl: 3 .  LUMIGAN 0.01 % SOLN, Place 1 drop into both eyes at bedtime. , Disp: , Rfl:  .  memantine (NAMENDA) 10 MG tablet, Take 1 tablet (10 mg total) by mouth 2 (two) times daily., Disp: 60 tablet, Rfl: 5 .  mirabegron ER (MYRBETRIQ) 50 MG TB24 tablet, Take 50 mg by mouth daily., Disp: , Rfl:  .  Multiple Vitamin (MULTIVITAMIN) tablet, Take 1 tablet by mouth daily.  , Disp: , Rfl:  .  omeprazole (PRILOSEC) 20 MG capsule, , Disp: , Rfl:  .  Probiotic Product ([facility]S PROBIOTIC) CHEW, Chew by mouth daily., Disp: , Rfl:  .  Pyridoxine HCl (VITAMIN B-6) 250 MG tablet, Take 1 tablet (250 mg total) by mouth daily., Disp: 90 tablet, Rfl: 3 .  simvastatin (ZOCOR) 40 MG tablet, TAKE 1 TABLET BY MOUTH  DAILY AT 6 PM, Disp: 90 tablet, Rfl: 1 No current facility-administered medications for this visit.   Facility-Administered Medications Ordered in Other Visits:  .  darbepoetin (ARANESP) injection 300 mcg, 300 mcg, Subcutaneous, Once, [name], MD  Allergies: No Known Allergies  Past Medical History, Surgical history, Social history, and Family History were reviewed and updated.  Review of Systems: As above  Physical Exam:  weight  is 166 lb 12.8 oz (75.7 kg). His oral temperature is 95 F (35 C) (abnormal). His blood pressure is 147/60 (abnormal) and his pulse is 72.   Wt Readings from Last 3 Encounters:  10/22/16 166 lb 12.8 oz (75.7 kg)  10/01/16 162 lb 12.8 oz (73.8 kg)  09/20/16 163 lb (73.9 kg)     Elderly white gentleman in no obvious distress. Head and neck exam shows no ocular or oral lesions. There are no palpable cervical or supraclavicular lymph nodes. Lungs are clear. Cardiac exam regular rate and rhythm with no murmurs, rubs or bruits. Abdomen is soft. He has good bowel sounds. There is no fluid wave. There is no palpable liver or spleen tip. Back exam shows some kyphosis. Extremities shows some age-related osteoarthritic changes.  He has decent range of motion of his joints. Skin exam shows no rashes, ecchymoses or petechia.  Lab Results  Component Value Date   WBC 5.4 10/22/2016   HGB 8.4 (L) 10/22/2016   HCT 24.7 (L) 10/22/2016   MCV 100 (H) 10/22/2016   PLT 489 (H) 10/22/2016     Chemistry      Component Value Date/Time   NA 127 (L) 08/20/2016 1453   NA 131 (L) 07/30/2016 1039   K 4.4 08/20/2016 1453   K 4.4 07/30/2016 1039   CL 100 08/20/2016 1453   CO2 24 08/20/2016 1453   CO2 21 (L) 07/30/2016 1039   BUN 23 (H) 08/20/2016 1453   BUN 20.3 07/30/2016 1039   CREATININE 1.4 (H) 08/20/2016 1453   CREATININE 0.9 07/30/2016 1039      Component Value Date/Time   CALCIUM 8.5 08/20/2016 1453   CALCIUM 8.7 07/30/2016 1039   ALKPHOS 65 08/20/2016 1453   ALKPHOS 47 07/30/2016 1039   AST 39 (H) 08/20/2016 1453   AST 17 07/30/2016 1039   ALT 61 (H) 08/20/2016 1453   ALT 18 07/30/2016 1039   BILITOT 0.90 08/20/2016 1453   BILITOT 0.93 07/30/2016 1039         Impression and Plan: Alexander Rice is 82 year old gentleman with myelodysplasia. He has refractory anemia with ringed sideroblasts.   I will go ahead and give him Procrit today.  I do not think that he needs any blood. His hemoglobinis stable. I want to try to hold off on transfusing him as much as possible.   I want to get him back to see him in another 4 weeks.  [name], MD 12/4/20172:18 PM

## 2016-10-22 NOTE — Patient Instructions (Signed)
Epoetin Alfa injection °What is this medicine? °EPOETIN ALFA (e POE e tin AL fa) helps your body make more red blood cells. This medicine is used to treat anemia caused by chronic kidney failure, cancer chemotherapy, or HIV-therapy. It may also be used before surgery if you have anemia. °This medicine may be used for other purposes; ask your health care provider or pharmacist if you have questions. °COMMON BRAND NAME(S): Epogen, Procrit °What should I tell my health care provider before I take this medicine? °They need to know if you have any of these conditions: °-blood clotting disorders °-cancer patient not on chemotherapy °-cystic fibrosis °-heart disease, such as angina or heart failure °-hemoglobin level of 12 g/dL or greater °-high blood pressure °-low levels of folate, iron, or vitamin B12 °-seizures °-an unusual or allergic reaction to erythropoietin, albumin, benzyl alcohol, hamster proteins, other medicines, foods, dyes, or preservatives °-pregnant or trying to get pregnant °-breast-feeding °How should I use this medicine? °This medicine is for injection into a vein or under the skin. It is usually given by a health care professional in a hospital or clinic setting. °If you get this medicine at home, you will be taught how to prepare and give this medicine. Use exactly as directed. Take your medicine at regular intervals. Do not take your medicine more often than directed. °It is important that you put your used needles and syringes in a special sharps container. Do not put them in a trash can. If you do not have a sharps container, call your pharmacist or healthcare provider to get one. °A special MedGuide will be given to you by the pharmacist with each prescription and refill. Be sure to read this information carefully each time. °Talk to your pediatrician regarding the use of this medicine in children. While this drug may be prescribed for selected conditions, precautions do apply. °Overdosage: If you  think you have taken too much of this medicine contact a poison control center or emergency room at once. °NOTE: This medicine is only for you. Do not share this medicine with others. °What if I miss a dose? °If you miss a dose, take it as soon as you can. If it is almost time for your next dose, take only that dose. Do not take double or extra doses. °What may interact with this medicine? °Do not take this medicine with any of the following medications: °-darbepoetin alfa °This list may not describe all possible interactions. Give your health care provider a list of all the medicines, herbs, non-prescription drugs, or dietary supplements you use. Also tell them if you smoke, drink alcohol, or use illegal drugs. Some items may interact with your medicine. °What should I watch for while using this medicine? °Your condition will be monitored carefully while you are receiving this medicine. °You may need blood work done while you are taking this medicine. °What side effects may I notice from receiving this medicine? °Side effects that you should report to your doctor or health care professional as soon as possible: °-allergic reactions like skin rash, itching or hives, swelling of the face, lips, or tongue °-breathing problems °-changes in vision °-chest pain °-confusion, trouble speaking or understanding °-feeling faint or lightheaded, falls °-high blood pressure °-muscle aches or pains °-pain, swelling, warmth in the leg °-rapid weight gain °-severe headaches °-sudden numbness or weakness of the face, arm or leg °-trouble walking, dizziness, loss of balance or coordination °-seizures (convulsions) °-swelling of the ankles, feet, hands °-unusually weak or tired °  Side effects that usually do not require medical attention (report to your doctor or health care professional if they continue or are bothersome): °-diarrhea °-fever, chills (flu-like symptoms) °-headaches °-nausea, vomiting °-redness, stinging, or swelling at  site where injected °This list may not describe all possible side effects. Call your doctor for medical advice about side effects. You may report side effects to FDA at 1-800-FDA-1088. °Where should I keep my medicine? °Keep out of the reach of children. °Store in a refrigerator between 2 and 8 degrees C (36 and 46 degrees F). Do not freeze or shake. Throw away any unused portion if using a single-dose vial. Multi-dose vials can be kept in the refrigerator for up to 21 days after the initial dose. Throw away unused medicine. °NOTE: This sheet is a summary. It may not cover all possible information. If you have questions about this medicine, talk to your doctor, pharmacist, or health care provider. °© 2017 [employer] (2016-06-25 19:42:31) ° °

## 2016-10-23 LAB — RETICULOCYTES: RETICULOCYTE COUNT: 0.7 % (ref 0.6–2.6)

## 2016-10-25 ENCOUNTER — Telehealth: Payer: Self-pay | Admitting: *Deleted

## 2016-10-25 LAB — WOUND CULTURE

## 2016-10-25 MED ORDER — SULFAMETHOXAZOLE-TRIMETHOPRIM 800-160 MG PO TABS: 1.0000 | ORAL_TABLET | Freq: Two times a day (BID) | ORAL | 0 refills | Status: DC

## 2016-10-25 NOTE — Telephone Encounter (Addendum)
Daughter aware of results and new prescription  ----- Message from [name], MD sent at 10/25/2016 11:14 AM EST ----- Call his dgtr -  He has Staph growing from the wound.  Call in Bactrim DS 1 oi BID x 7 days.  pete

## 2016-11-13 ENCOUNTER — Other Ambulatory Visit: Payer: Medicare Other

## 2016-11-13 ENCOUNTER — Ambulatory Visit: Payer: Medicare Other

## 2016-11-13 ENCOUNTER — Ambulatory Visit: Payer: Medicare Other | Admitting: Hematology & Oncology

## 2016-11-14 ENCOUNTER — Other Ambulatory Visit (HOSPITAL_BASED_OUTPATIENT_CLINIC_OR_DEPARTMENT_OTHER): Payer: Medicare Other

## 2016-11-14 ENCOUNTER — Ambulatory Visit: Payer: Medicare Other | Admitting: Hematology & Oncology

## 2016-11-14 ENCOUNTER — Ambulatory Visit (HOSPITAL_BASED_OUTPATIENT_CLINIC_OR_DEPARTMENT_OTHER): Payer: Medicare Other

## 2016-11-14 VITALS — BP 149/45 | HR 63 | Temp 97.0°F | Resp 16

## 2016-11-14 DIAGNOSIS — D469 Myelodysplastic syndrome, unspecified: Secondary | ICD-10-CM

## 2016-11-14 DIAGNOSIS — D631 Anemia in chronic kidney disease: Secondary | ICD-10-CM | POA: Diagnosis not present

## 2016-11-14 DIAGNOSIS — N189 Chronic kidney disease, unspecified: Secondary | ICD-10-CM

## 2016-11-14 DIAGNOSIS — D461 Refractory anemia with ring sideroblasts: Secondary | ICD-10-CM

## 2016-11-14 LAB — CBC WITH DIFFERENTIAL (CANCER CENTER ONLY)
BASO#: 0 10*3/uL (ref 0.0–0.2)
BASO%: 0.2 % (ref 0.0–2.0)
EOS%: 2.2 % (ref 0.0–7.0)
Eosinophils Absolute: 0.1 10*3/uL (ref 0.0–0.5)
HEMATOCRIT: 23.8 % — AB (ref 38.7–49.9)
HGB: 8.3 g/dL — ABNORMAL LOW (ref 13.0–17.1)
LYMPH#: 1.7 10*3/uL (ref 0.9–3.3)
LYMPH%: 33.4 % (ref 14.0–48.0)
MCH: 34.3 pg — ABNORMAL HIGH (ref 28.0–33.4)
MCHC: 34.9 g/dL (ref 32.0–35.9)
MCV: 98 fL (ref 82–98)
MONO#: 0.5 10*3/uL (ref 0.1–0.9)
MONO%: 9.3 % (ref 0.0–13.0)
NEUT#: 2.8 10*3/uL (ref 1.5–6.5)
NEUT%: 54.9 % (ref 40.0–80.0)
PLATELETS: 497 10*3/uL — AB (ref 145–400)
RBC: 2.42 10*6/uL — ABNORMAL LOW (ref 4.20–5.70)
RDW: 18.6 % — AB (ref 11.1–15.7)
WBC: 5.1 10*3/uL (ref 4.0–10.0)

## 2016-11-14 MED ORDER — EPOETIN ALFA 40000 UNIT/ML IJ SOLN
40000.0000 [IU] | Freq: Once | INTRAMUSCULAR | Status: AC
  Administered 2016-11-14: 40000 [IU] via SUBCUTANEOUS

## 2016-11-14 MED ORDER — EPOETIN ALFA 40000 UNIT/ML IJ SOLN
INTRAMUSCULAR | Status: AC
  Filled 2016-11-14: qty 1

## 2016-11-14 NOTE — Patient Instructions (Signed)
Epoetin Alfa injection °What is this medicine? °EPOETIN ALFA (e POE e tin AL fa) helps your body make more red blood cells. This medicine is used to treat anemia caused by chronic kidney failure, cancer chemotherapy, or HIV-therapy. It may also be used before surgery if you have anemia. °This medicine may be used for other purposes; ask your health care provider or pharmacist if you have questions. °COMMON BRAND NAME(S): Epogen, Procrit °What should I tell my health care provider before I take this medicine? °They need to know if you have any of these conditions: °-blood clotting disorders °-cancer patient not on chemotherapy °-cystic fibrosis °-heart disease, such as angina or heart failure °-hemoglobin level of 12 g/dL or greater °-high blood pressure °-low levels of folate, iron, or vitamin B12 °-seizures °-an unusual or allergic reaction to erythropoietin, albumin, benzyl alcohol, hamster proteins, other medicines, foods, dyes, or preservatives °-pregnant or trying to get pregnant °-breast-feeding °How should I use this medicine? °This medicine is for injection into a vein or under the skin. It is usually given by a health care professional in a hospital or clinic setting. °If you get this medicine at home, you will be taught how to prepare and give this medicine. Use exactly as directed. Take your medicine at regular intervals. Do not take your medicine more often than directed. °It is important that you put your used needles and syringes in a special sharps container. Do not put them in a trash can. If you do not have a sharps container, call your pharmacist or healthcare provider to get one. °A special MedGuide will be given to you by the pharmacist with each prescription and refill. Be sure to read this information carefully each time. °Talk to your pediatrician regarding the use of this medicine in children. While this drug may be prescribed for selected conditions, precautions do apply. °Overdosage: If you  think you have taken too much of this medicine contact a poison control center or emergency room at once. °NOTE: This medicine is only for you. Do not share this medicine with others. °What if I miss a dose? °If you miss a dose, take it as soon as you can. If it is almost time for your next dose, take only that dose. Do not take double or extra doses. °What may interact with this medicine? °Do not take this medicine with any of the following medications: °-darbepoetin alfa °This list may not describe all possible interactions. Give your health care provider a list of all the medicines, herbs, non-prescription drugs, or dietary supplements you use. Also tell them if you smoke, drink alcohol, or use illegal drugs. Some items may interact with your medicine. °What should I watch for while using this medicine? °Your condition will be monitored carefully while you are receiving this medicine. °You may need blood work done while you are taking this medicine. °What side effects may I notice from receiving this medicine? °Side effects that you should report to your doctor or health care professional as soon as possible: °-allergic reactions like skin rash, itching or hives, swelling of the face, lips, or tongue °-breathing problems °-changes in vision °-chest pain °-confusion, trouble speaking or understanding °-feeling faint or lightheaded, falls °-high blood pressure °-muscle aches or pains °-pain, swelling, warmth in the leg °-rapid weight gain °-severe headaches °-sudden numbness or weakness of the face, arm or leg °-trouble walking, dizziness, loss of balance or coordination °-seizures (convulsions) °-swelling of the ankles, feet, hands °-unusually weak or tired °  Side effects that usually do not require medical attention (report to your doctor or health care professional if they continue or are bothersome): °-diarrhea °-fever, chills (flu-like symptoms) °-headaches °-nausea, vomiting °-redness, stinging, or swelling at  site where injected °This list may not describe all possible side effects. Call your doctor for medical advice about side effects. You may report side effects to FDA at 1-800-FDA-1088. °Where should I keep my medicine? °Keep out of the reach of children. °Store in a refrigerator between 2 and 8 degrees C (36 and 46 degrees F). Do not freeze or shake. Throw away any unused portion if using a single-dose vial. Multi-dose vials can be kept in the refrigerator for up to 21 days after the initial dose. Throw away unused medicine. °NOTE: This sheet is a summary. It may not cover all possible information. If you have questions about this medicine, talk to your doctor, pharmacist, or health care provider. °© 2017 [employer] (2016-06-25 19:42:31) ° °

## 2016-11-15 LAB — IRON AND TIBC
%SAT: 91 % — AB (ref 20–55)
IRON: 180 ug/dL — AB (ref 42–163)
TIBC: 198 ug/dL — AB (ref 202–409)
UIBC: 17 ug/dL — AB (ref 117–376)

## 2016-11-15 LAB — FERRITIN: Ferritin: 1063 ng/ml — ABNORMAL HIGH (ref 22–316)

## 2016-11-15 LAB — RETICULOCYTES: RETICULOCYTE COUNT: 0.8 % (ref 0.6–2.6)

## 2016-11-20 ENCOUNTER — Other Ambulatory Visit: Payer: Self-pay | Admitting: Family Medicine

## 2016-11-27 DIAGNOSIS — C4442 Squamous cell carcinoma of skin of scalp and neck: Secondary | ICD-10-CM | POA: Diagnosis not present

## 2016-11-30 ENCOUNTER — Other Ambulatory Visit: Payer: Self-pay | Admitting: *Deleted

## 2016-11-30 DIAGNOSIS — D469 Myelodysplastic syndrome, unspecified: Secondary | ICD-10-CM

## 2016-12-03 ENCOUNTER — Other Ambulatory Visit (HOSPITAL_BASED_OUTPATIENT_CLINIC_OR_DEPARTMENT_OTHER): Payer: Medicare Other

## 2016-12-03 ENCOUNTER — Ambulatory Visit (HOSPITAL_BASED_OUTPATIENT_CLINIC_OR_DEPARTMENT_OTHER): Payer: Medicare Other | Admitting: Hematology & Oncology

## 2016-12-03 ENCOUNTER — Encounter: Payer: Self-pay | Admitting: Hematology & Oncology

## 2016-12-03 ENCOUNTER — Ambulatory Visit (HOSPITAL_BASED_OUTPATIENT_CLINIC_OR_DEPARTMENT_OTHER): Payer: Medicare Other

## 2016-12-03 VITALS — BP 132/52 | HR 61

## 2016-12-03 VITALS — BP 165/55 | HR 68 | Temp 97.6°F | Resp 18 | Ht 68.0 in | Wt 169.8 lb

## 2016-12-03 DIAGNOSIS — D469 Myelodysplastic syndrome, unspecified: Secondary | ICD-10-CM

## 2016-12-03 DIAGNOSIS — D461 Refractory anemia with ring sideroblasts: Secondary | ICD-10-CM

## 2016-12-03 DIAGNOSIS — M1990 Unspecified osteoarthritis, unspecified site: Secondary | ICD-10-CM

## 2016-12-03 DIAGNOSIS — D510 Vitamin B12 deficiency anemia due to intrinsic factor deficiency: Secondary | ICD-10-CM | POA: Diagnosis not present

## 2016-12-03 LAB — CBC WITH DIFFERENTIAL (CANCER CENTER ONLY)
BASO#: 0 10*3/uL (ref 0.0–0.2)
BASO%: 0.2 % (ref 0.0–2.0)
EOS ABS: 0.2 10*3/uL (ref 0.0–0.5)
EOS%: 3.7 % (ref 0.0–7.0)
HCT: 24 % — ABNORMAL LOW (ref 38.7–49.9)
HEMOGLOBIN: 8.2 g/dL — AB (ref 13.0–17.1)
LYMPH#: 1.8 10*3/uL (ref 0.9–3.3)
LYMPH%: 33.3 % (ref 14.0–48.0)
MCH: 34.5 pg — AB (ref 28.0–33.4)
MCHC: 34.2 g/dL (ref 32.0–35.9)
MCV: 101 fL — AB (ref 82–98)
MONO#: 0.4 10*3/uL (ref 0.1–0.9)
MONO%: 7.5 % (ref 0.0–13.0)
NEUT%: 55.3 % (ref 40.0–80.0)
NEUTROS ABS: 3 10*3/uL (ref 1.5–6.5)
Platelets: 502 10*3/uL — ABNORMAL HIGH (ref 145–400)
RBC: 2.38 10*6/uL — AB (ref 4.20–5.70)
RDW: 19.9 % — ABNORMAL HIGH (ref 11.1–15.7)
WBC: 5.5 10*3/uL (ref 4.0–10.0)

## 2016-12-03 LAB — IRON AND TIBC
%SAT: 83 % — AB (ref 20–55)
Iron: 165 ug/dL — ABNORMAL HIGH (ref 42–163)
TIBC: 200 ug/dL — AB (ref 202–409)
UIBC: 35 ug/dL — AB (ref 117–376)

## 2016-12-03 LAB — FERRITIN: Ferritin: 876 ng/ml — ABNORMAL HIGH (ref 22–316)

## 2016-12-03 MED ORDER — EPOETIN ALFA 40000 UNIT/ML IJ SOLN
40000.0000 [IU] | Freq: Once | INTRAMUSCULAR | Status: AC
  Administered 2016-12-03: 40000 [IU] via SUBCUTANEOUS

## 2016-12-03 MED ORDER — EPOETIN ALFA 40000 UNIT/ML IJ SOLN
INTRAMUSCULAR | Status: AC
  Filled 2016-12-03: qty 1

## 2016-12-03 NOTE — Progress Notes (Signed)
As well as bone or  Hematology and Oncology Follow Up Visit  ATIF ZAPPONE [id number] [birth year] 83 y.o. 12/03/2016   Principle Diagnosis:   Refractory anemia with ringed sideroblasts.  Pernicious anemia  Severe osteoarthritis  Current Therapy:    Procrit 40,000 units subcutaneous every 3 weeks  Vitamin B6 250 mg by mouth daily  Vitamin B-12 1 mg IM every month-done at home  Blood transfusions as indicated-last transfusion on 07/31/2016     Interim History:  Mr. Venus is back for follow-up.  He is doing okay. He comes in with his wife and daughter. They are doing okay. He did have a nice Christmas and New Year's. Apparent, family came down from [place]. Then a nice time with them.  He still has the dementia. However, he is incredibly pleasant.   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 The incision site looks a lot better. We last saw him, there was some infection there. He is growing Staphylococcus. Thank you, this was not MRSA.  I did go ahead and give him some antibiotics   His dementia is really his biggest problem. He really has very little memory. He is very pleasant. I know that he tries to remember but he just can't.   His iron studies today show his ferritin to be 1063.  His iron saturation is 91%.   He has had no obvious bleeding.   Looks like he did have a nice Thanksgiving.   He does have bad arthritis. He uses a cane to get around. He has some bad osteoporosis. I think he is taking his vitamin D. His family really has to make sure that he takes his medications.  Overall, his performance status is ECOG 3.  Medications:  Current Outpatient Prescriptions:  .  aspirin 81 MG tablet, Take 81 mg by mouth daily.  , Disp: , Rfl:  .  [employer] OMEGA-3 KRILL OIL PO, Take 350 mg by mouth daily. Reported on 01/16/2016, Disp: , Rfl:  .  cyanocobalamin 100 MCG tablet, Take by mouth daily., Disp: , Rfl:  .  dorzolamide (TRUSOPT) 2 % ophthalmic  solution, Place 1 drop into both eyes 2 (two) times daily.  , Disp: , Rfl:  .  doxazosin (CARDURA) 4 MG tablet, TAKE 1 TABLET BY MOUTH AT  BEDTIME, Disp: 90 tablet, Rfl: 3 .  lisinopril (PRINIVIL,ZESTRIL) 5 MG tablet, Take 1 tablet (5 mg total) by mouth daily., Disp: 90 tablet, Rfl: 3 .  LUMIGAN 0.01 % SOLN, Place 1 drop into both eyes at bedtime. , Disp: , Rfl:  .  memantine (NAMENDA) 10 MG tablet, Take 1 tablet (10 mg total) by mouth 2 (two) times daily., Disp: 60 tablet, Rfl: 5 .  mirabegron ER (MYRBETRIQ) 50 MG TB24 tablet, Take 50 mg by mouth daily., Disp: , Rfl:  .  Multiple Vitamin (MULTIVITAMIN) tablet, Take 1 tablet by mouth daily.  , Disp: , Rfl:  .  omeprazole (PRILOSEC) 20 MG capsule, TAKE 1 CAPSULE BY MOUTH  DAILY, Disp: 90 capsule, Rfl: 1 .  Probiotic Product ([employer]S PROBIOTIC) CHEW, Chew by mouth daily., Disp: , Rfl:  .  Pyridoxine HCl (VITAMIN B-6) 250 MG tablet, Take 1 tablet (250 mg total) by mouth daily., Disp: 90 tablet, Rfl: 3 .  simvastatin (ZOCOR) 40 MG tablet, TAKE 1 TABLET BY MOUTH  DAILY AT 6 PM, Disp: 90 tablet, Rfl: 1 .  sulfamethoxazole-trimethoprim (BACTRIM DS,SEPTRA DS) 800-160 MG tablet, Take 1 tablet by  mouth 2 (two) times daily. For 7 days, Disp: 14 tablet, Rfl: 0 No current facility-administered medications for this visit.   Facility-Administered Medications Ordered in Other Visits:  .  darbepoetin (ARANESP) injection 300 mcg, 300 mcg, Subcutaneous, Once, [name], MD  Allergies: No Known Allergies  Past Medical History, Surgical history, Social history, and Family History were reviewed and updated.  Review of Systems: As above  Physical Exam:  height is 5\' 8"  (1.727 m) and weight is 169 lb 12.8 oz (77 kg). His oral temperature is 97.6 F (36.4 C). His blood pressure is 165/55 (abnormal) and his pulse is 68. His respiration is 18.   Wt Readings from Last 3 Encounters:  12/03/16 169 lb 12.8 oz (77 kg)  10/22/16 166 lb 12.8 oz (75.7 kg)  10/01/16  162 lb 12.8 oz (73.8 kg)     Elderly white gentleman in no obvious distress. Head and neck exam shows no ocular or oral lesions. There are no palpable cervical or supraclavicular lymph nodes. Lungs are clear. Cardiac exam regular rate and rhythm with no murmurs, rubs or bruits. Abdomen is soft. He has good bowel sounds. There is no fluid wave. There is no palpable liver or spleen tip. Back exam shows some kyphosis. Extremities shows some age-related osteoarthritic changes. He has decent range of motion of his joints. Skin exam shows no rashes, ecchymoses or petechia.  Lab Results  Component Value Date   WBC 5.5 12/03/2016   HGB 8.2 (L) 12/03/2016   HCT 24.0 (L) 12/03/2016   MCV 101 (H) 12/03/2016   PLT 502 (H) 12/03/2016     Chemistry      Component Value Date/Time   NA 127 (L) 08/20/2016 1453   NA 131 (L) 07/30/2016 1039   K 4.4 08/20/2016 1453   K 4.4 07/30/2016 1039   CL 100 08/20/2016 1453   CO2 24 08/20/2016 1453   CO2 21 (L) 07/30/2016 1039   BUN 23 (H) 08/20/2016 1453   BUN 20.3 07/30/2016 1039   CREATININE 1.4 (H) 08/20/2016 1453   CREATININE 0.9 07/30/2016 1039      Component Value Date/Time   CALCIUM 8.5 08/20/2016 1453   CALCIUM 8.7 07/30/2016 1039   ALKPHOS 65 08/20/2016 1453   ALKPHOS 47 07/30/2016 1039   AST 39 (H) 08/20/2016 1453   AST 17 07/30/2016 1039   ALT 61 (H) 08/20/2016 1453   ALT 18 07/30/2016 1039   BILITOT 0.90 08/20/2016 1453   BILITOT 0.93 07/30/2016 1039         Impression and Plan: Mr. Stemarie is 83 year old gentleman with myelodysplasia. He has refractory anemia with ringed sideroblasts.   I will go ahead and give him Procrit today.  I do not think that he needs any blood. Once his hemoglobin gets Blose 8, then I will go ahead and transfuse him. I suspect this might happen within the next couple months.    I want to get him back to see him in another 3 weeks.  [name], MD 1/15/201812:31 PM

## 2016-12-03 NOTE — Patient Instructions (Signed)
Epoetin Alfa injection °What is this medicine? °EPOETIN ALFA (e POE e tin AL fa) helps your body make more red blood cells. This medicine is used to treat anemia caused by chronic kidney failure, cancer chemotherapy, or HIV-therapy. It may also be used before surgery if you have anemia. °This medicine may be used for other purposes; ask your health care provider or pharmacist if you have questions. °COMMON BRAND NAME(S): Epogen, Procrit °What should I tell my health care provider before I take this medicine? °They need to know if you have any of these conditions: °-blood clotting disorders °-cancer patient not on chemotherapy °-cystic fibrosis °-heart disease, such as angina or heart failure °-hemoglobin level of 12 g/dL or greater °-high blood pressure °-low levels of folate, iron, or vitamin B12 °-seizures °-an unusual or allergic reaction to erythropoietin, albumin, benzyl alcohol, hamster proteins, other medicines, foods, dyes, or preservatives °-pregnant or trying to get pregnant °-breast-feeding °How should I use this medicine? °This medicine is for injection into a vein or under the skin. It is usually given by a health care professional in a hospital or clinic setting. °If you get this medicine at home, you will be taught how to prepare and give this medicine. Use exactly as directed. Take your medicine at regular intervals. Do not take your medicine more often than directed. °It is important that you put your used needles and syringes in a special sharps container. Do not put them in a trash can. If you do not have a sharps container, call your pharmacist or healthcare provider to get one. °A special MedGuide will be given to you by the pharmacist with each prescription and refill. Be sure to read this information carefully each time. °Talk to your pediatrician regarding the use of this medicine in children. While this drug may be prescribed for selected conditions, precautions do apply. °Overdosage: If you  think you have taken too much of this medicine contact a poison control center or emergency room at once. °NOTE: This medicine is only for you. Do not share this medicine with others. °What if I miss a dose? °If you miss a dose, take it as soon as you can. If it is almost time for your next dose, take only that dose. Do not take double or extra doses. °What may interact with this medicine? °Do not take this medicine with any of the following medications: °-darbepoetin alfa °This list may not describe all possible interactions. Give your health care provider a list of all the medicines, herbs, non-prescription drugs, or dietary supplements you use. Also tell them if you smoke, drink alcohol, or use illegal drugs. Some items may interact with your medicine. °What should I watch for while using this medicine? °Your condition will be monitored carefully while you are receiving this medicine. °You may need blood work done while you are taking this medicine. °What side effects may I notice from receiving this medicine? °Side effects that you should report to your doctor or health care professional as soon as possible: °-allergic reactions like skin rash, itching or hives, swelling of the face, lips, or tongue °-breathing problems °-changes in vision °-chest pain °-confusion, trouble speaking or understanding °-feeling faint or lightheaded, falls °-high blood pressure °-muscle aches or pains °-pain, swelling, warmth in the leg °-rapid weight gain °-severe headaches °-sudden numbness or weakness of the face, arm or leg °-trouble walking, dizziness, loss of balance or coordination °-seizures (convulsions) °-swelling of the ankles, feet, hands °-unusually weak or tired °  Side effects that usually do not require medical attention (report to your doctor or health care professional if they continue or are bothersome): °-diarrhea °-fever, chills (flu-like symptoms) °-headaches °-nausea, vomiting °-redness, stinging, or swelling at  site where injected °This list may not describe all possible side effects. Call your doctor for medical advice about side effects. You may report side effects to FDA at 1-800-FDA-1088. °Where should I keep my medicine? °Keep out of the reach of children. °Store in a refrigerator between 2 and 8 degrees C (36 and 46 degrees F). Do not freeze or shake. Throw away any unused portion if using a single-dose vial. Multi-dose vials can be kept in the refrigerator for up to 21 days after the initial dose. Throw away unused medicine. °NOTE: This sheet is a summary. It may not cover all possible information. If you have questions about this medicine, talk to your doctor, pharmacist, or health care provider. °© 2017 [employer] (2016-06-25 19:42:31) ° °

## 2016-12-04 LAB — RETICULOCYTES: Reticulocyte Count: 0.7 % (ref 0.6–2.6)

## 2016-12-17 DIAGNOSIS — H04123 Dry eye syndrome of bilateral lacrimal glands: Secondary | ICD-10-CM | POA: Diagnosis not present

## 2016-12-17 DIAGNOSIS — H401131 Primary open-angle glaucoma, bilateral, mild stage: Secondary | ICD-10-CM | POA: Diagnosis not present

## 2016-12-20 ENCOUNTER — Ambulatory Visit: Payer: Medicare Other | Admitting: Podiatry

## 2016-12-20 ENCOUNTER — Ambulatory Visit (HOSPITAL_COMMUNITY)
Admission: RE | Admit: 2016-12-20 | Discharge: 2016-12-20 | Disposition: A | Discharge status: Home / Self Care | Payer: Medicare Other | Source: Ambulatory Visit | Attending: Hematology & Oncology | Admitting: Hematology & Oncology

## 2016-12-24 ENCOUNTER — Other Ambulatory Visit: Payer: Medicare Other

## 2016-12-24 ENCOUNTER — Ambulatory Visit: Payer: Medicare Other

## 2016-12-24 ENCOUNTER — Ambulatory Visit: Payer: Medicare Other | Admitting: Hematology & Oncology

## 2016-12-26 ENCOUNTER — Encounter: Payer: Self-pay | Admitting: Podiatry

## 2016-12-26 ENCOUNTER — Ambulatory Visit (INDEPENDENT_AMBULATORY_CARE_PROVIDER_SITE_OTHER): Payer: Medicare Other | Admitting: Podiatry

## 2016-12-26 DIAGNOSIS — I739 Peripheral vascular disease, unspecified: Secondary | ICD-10-CM

## 2016-12-26 DIAGNOSIS — M79675 Pain in left toe(s): Secondary | ICD-10-CM

## 2016-12-26 DIAGNOSIS — B351 Tinea unguium: Secondary | ICD-10-CM

## 2016-12-26 NOTE — Progress Notes (Signed)
Patient ID: Alexander Rice, male   DOB: 05/23/1932, 84 y.o.   MRN: [id number] HPI  Complaint:  Visit Type: Patient returns to my office for continued preventative foot care services. Complaint: Patient states" my nails have grown long and thick and become painful to walk and wear shoes. He presents for preventative foot care services. No changes to ROS  Podiatric Exam: Vascular: dorsalis pedis and posterior tibial pulses are negative. Capillary return is diminished.. Temperature gradient is negative. Skin turgor WNL,   Sensorium: Normal Semmes Weinstein monofilament test. Normal tactile sensation bilaterally.  Nail Exam: Pt has thick disfigured discolored nails with subungual debris noted bilateral entire nail hallux  Ulcer Exam: There is no evidence of ulcer or pre-ulcerative changes or infection. Orthopedic Exam: Muscle tone and strength are WNL. No limitations in general ROM. No crepitus or effusions noted. Foot type and digits show no abnormalities. Bony prominences are unremarkable. Skin: No Porokeratosis. No infection or ulcers  Diagnosis:  Tinea unguium, Pain in right toe, pain in left toes  Treatment & Plan Procedures and Treatment: Consent by patient was obtained for treatment procedures. The patient understood the discussion of treatment and procedures well. All questions were answered thoroughly reviewed. Debridement of mycotic and hypertrophic toenails, 1 through 5 bilateral and clearing of subungual debris. No ulceration, no infection noted.  Return Visit-Office Procedure: Patient instructed to return to the office for a follow up visit 3 months for continued evaluation and treatment.   Cordai Rodrigue DPM  

## 2016-12-28 ENCOUNTER — Ambulatory Visit (HOSPITAL_BASED_OUTPATIENT_CLINIC_OR_DEPARTMENT_OTHER): Payer: Medicare Other

## 2016-12-28 ENCOUNTER — Other Ambulatory Visit (HOSPITAL_BASED_OUTPATIENT_CLINIC_OR_DEPARTMENT_OTHER): Payer: Medicare Other

## 2016-12-28 ENCOUNTER — Ambulatory Visit (HOSPITAL_BASED_OUTPATIENT_CLINIC_OR_DEPARTMENT_OTHER): Payer: Medicare Other | Admitting: Family

## 2016-12-28 VITALS — BP 145/57 | HR 64 | Temp 97.8°F | Resp 16 | Wt 164.8 lb

## 2016-12-28 DIAGNOSIS — D461 Refractory anemia with ring sideroblasts: Secondary | ICD-10-CM

## 2016-12-28 DIAGNOSIS — D469 Myelodysplastic syndrome, unspecified: Secondary | ICD-10-CM

## 2016-12-28 DIAGNOSIS — D510 Vitamin B12 deficiency anemia due to intrinsic factor deficiency: Secondary | ICD-10-CM | POA: Diagnosis not present

## 2016-12-28 DIAGNOSIS — D649 Anemia, unspecified: Secondary | ICD-10-CM

## 2016-12-28 LAB — IRON AND TIBC
%SAT: 58 % — ABNORMAL HIGH (ref 20–55)
IRON: 121 ug/dL (ref 42–163)
TIBC: 209 ug/dL (ref 202–409)
UIBC: 88 ug/dL — ABNORMAL LOW (ref 117–376)

## 2016-12-28 LAB — CBC WITH DIFFERENTIAL (CANCER CENTER ONLY)
BASO#: 0 10*3/uL (ref 0.0–0.2)
BASO%: 0.2 % (ref 0.0–2.0)
EOS%: 2.8 % (ref 0.0–7.0)
Eosinophils Absolute: 0.1 10*3/uL (ref 0.0–0.5)
HEMATOCRIT: 24.1 % — AB (ref 38.7–49.9)
HEMOGLOBIN: 8.3 g/dL — AB (ref 13.0–17.1)
LYMPH#: 1.5 10*3/uL (ref 0.9–3.3)
LYMPH%: 29.6 % (ref 14.0–48.0)
MCH: 34.3 pg — ABNORMAL HIGH (ref 28.0–33.4)
MCHC: 34.4 g/dL (ref 32.0–35.9)
MCV: 100 fL — ABNORMAL HIGH (ref 82–98)
MONO#: 0.5 10*3/uL (ref 0.1–0.9)
MONO%: 8.9 % (ref 0.0–13.0)
NEUT%: 58.5 % (ref 40.0–80.0)
NEUTROS ABS: 3 10*3/uL (ref 1.5–6.5)
Platelets: 483 10*3/uL — ABNORMAL HIGH (ref 145–400)
RBC: 2.42 10*6/uL — ABNORMAL LOW (ref 4.20–5.70)
RDW: 19.2 % — AB (ref 11.1–15.7)
WBC: 5 10*3/uL (ref 4.0–10.0)

## 2016-12-28 LAB — CMP (CANCER CENTER ONLY)
ALK PHOS: 53 U/L (ref 26–84)
ALT: 23 U/L (ref 10–47)
AST: 26 U/L (ref 11–38)
Albumin: 3.5 g/dL (ref 3.3–5.5)
BILIRUBIN TOTAL: 1 mg/dL (ref 0.20–1.60)
BUN, Bld: 23 mg/dL — ABNORMAL HIGH (ref 7–22)
CALCIUM: 8.5 mg/dL (ref 8.0–10.3)
CO2: 26 mEq/L (ref 18–33)
CREATININE: 1.2 mg/dL (ref 0.6–1.2)
Chloride: 99 mEq/L (ref 98–108)
GLUCOSE: 98 mg/dL (ref 73–118)
Potassium: 4.4 mEq/L (ref 3.3–4.7)
SODIUM: 135 meq/L (ref 128–145)
Total Protein: 5.8 g/dL — ABNORMAL LOW (ref 6.4–8.1)

## 2016-12-28 LAB — FERRITIN: Ferritin: 1010 ng/ml — ABNORMAL HIGH (ref 22–316)

## 2016-12-28 MED ORDER — EPOETIN ALFA 40000 UNIT/ML IJ SOLN
40000.0000 [IU] | Freq: Once | INTRAMUSCULAR | Status: AC
  Administered 2016-12-28: 40000 [IU] via SUBCUTANEOUS

## 2016-12-28 MED ORDER — EPOETIN ALFA 40000 UNIT/ML IJ SOLN
INTRAMUSCULAR | Status: AC
  Filled 2016-12-28: qty 1

## 2016-12-28 NOTE — Patient Instructions (Signed)
Epoetin Alfa injection °What is this medicine? °EPOETIN ALFA (e POE e tin AL fa) helps your body make more red blood cells. This medicine is used to treat anemia caused by chronic kidney failure, cancer chemotherapy, or HIV-therapy. It may also be used before surgery if you have anemia. °This medicine may be used for other purposes; ask your health care provider or pharmacist if you have questions. °COMMON BRAND NAME(S): Epogen, Procrit °What should I tell my health care provider before I take this medicine? °They need to know if you have any of these conditions: °-blood clotting disorders °-cancer patient not on chemotherapy °-cystic fibrosis °-heart disease, such as angina or heart failure °-hemoglobin level of 12 g/dL or greater °-high blood pressure °-low levels of folate, iron, or vitamin B12 °-seizures °-an unusual or allergic reaction to erythropoietin, albumin, benzyl alcohol, hamster proteins, other medicines, foods, dyes, or preservatives °-pregnant or trying to get pregnant °-breast-feeding °How should I use this medicine? °This medicine is for injection into a vein or under the skin. It is usually given by a health care professional in a hospital or clinic setting. °If you get this medicine at home, you will be taught how to prepare and give this medicine. Use exactly as directed. Take your medicine at regular intervals. Do not take your medicine more often than directed. °It is important that you put your used needles and syringes in a special sharps container. Do not put them in a trash can. If you do not have a sharps container, call your pharmacist or healthcare provider to get one. °A special MedGuide will be given to you by the pharmacist with each prescription and refill. Be sure to read this information carefully each time. °Talk to your pediatrician regarding the use of this medicine in children. While this drug may be prescribed for selected conditions, precautions do apply. °Overdosage: If you  think you have taken too much of this medicine contact a poison control center or emergency room at once. °NOTE: This medicine is only for you. Do not share this medicine with others. °What if I miss a dose? °If you miss a dose, take it as soon as you can. If it is almost time for your next dose, take only that dose. Do not take double or extra doses. °What may interact with this medicine? °Do not take this medicine with any of the following medications: °-darbepoetin alfa °This list may not describe all possible interactions. Give your health care provider a list of all the medicines, herbs, non-prescription drugs, or dietary supplements you use. Also tell them if you smoke, drink alcohol, or use illegal drugs. Some items may interact with your medicine. °What should I watch for while using this medicine? °Your condition will be monitored carefully while you are receiving this medicine. °You may need blood work done while you are taking this medicine. °What side effects may I notice from receiving this medicine? °Side effects that you should report to your doctor or health care professional as soon as possible: °-allergic reactions like skin rash, itching or hives, swelling of the face, lips, or tongue °-breathing problems °-changes in vision °-chest pain °-confusion, trouble speaking or understanding °-feeling faint or lightheaded, falls °-high blood pressure °-muscle aches or pains °-pain, swelling, warmth in the leg °-rapid weight gain °-severe headaches °-sudden numbness or weakness of the face, arm or leg °-trouble walking, dizziness, loss of balance or coordination °-seizures (convulsions) °-swelling of the ankles, feet, hands °-unusually weak or tired °  Side effects that usually do not require medical attention (report to your doctor or health care professional if they continue or are bothersome): °-diarrhea °-fever, chills (flu-like symptoms) °-headaches °-nausea, vomiting °-redness, stinging, or swelling at  site where injected °This list may not describe all possible side effects. Call your doctor for medical advice about side effects. You may report side effects to FDA at 1-800-FDA-1088. °Where should I keep my medicine? °Keep out of the reach of children. °Store in a refrigerator between 2 and 8 degrees C (36 and 46 degrees F). Do not freeze or shake. Throw away any unused portion if using a single-dose vial. Multi-dose vials can be kept in the refrigerator for up to 21 days after the initial dose. Throw away unused medicine. °NOTE: This sheet is a summary. It may not cover all possible information. If you have questions about this medicine, talk to your doctor, pharmacist, or health care provider. °© 2017 [employer] (2016-06-25 19:42:31) ° °

## 2016-12-28 NOTE — Progress Notes (Signed)
Hematology and Oncology Follow Up Visit  Alexander Rice [id number] [birth year] 83 y.o. 12/28/2016   Principle Diagnosis:  Anemia of renal insufficiency  Refractory anemia with ringed sideroblasts. Pernicious anemia  Current Therapy:   Procrit 40,000 units subcutaneous every 3 weeks Vitamin B6 250 mg by mouth daily Blood transfusions as indicated - last transfusion on 07/31/2016    Interim History: Alexander Rice is here today with his wife and daughter for a follow-up. He continues to do well. He has dementia and is pleasantly confused today. His family is very good with him.  He has had no issue with frequent infections. No fever, chills, n/v, cough, rash, headache, dizziness, vision changes, chest pain, palpitations, abdominal pain or changes in bowel or bladder habits. He continues to be incontinent of both stool and urine at times.  No swelling, tenderness, numbness or tingling in his extremities. No c/o pain. No lymphadenopathy found on exam. No episodes of bleeding or bruising.  He is ambulating nicely with his cane or walker. He has had no falls or syncopal episodes.  He has maintained a good appetite and his family is prompting him to stay hydrated. His weight is stable.   Medications:  Allergies as of 12/28/2016   No Known Allergies     Medication List       Accurate as of 12/28/16 11:45 AM. Always use your most recent med list.          aspirin 81 MG tablet Take 81 mg by mouth daily.   CVS OMEGA-3 KRILL OIL PO Take 350 mg by mouth daily. Reported on 01/16/2016   [facility]S PROBIOTIC Chew Chew by mouth daily.   cyanocobalamin 100 MCG tablet Take by mouth daily.   dorzolamide 2 % ophthalmic solution Commonly known as:  TRUSOPT Place 1 drop into both eyes 2 (two) times daily.   doxazosin 4 MG tablet Commonly known as:  CARDURA TAKE 1 TABLET BY MOUTH AT  BEDTIME   lisinopril 5 MG tablet Commonly known as:  PRINIVIL,ZESTRIL Take 1 tablet (5 mg total) by mouth  daily.   LUMIGAN 0.01 % Soln Generic drug:  bimatoprost Place 1 drop into both eyes at bedtime.   memantine 10 MG tablet Commonly known as:  NAMENDA Take 1 tablet (10 mg total) by mouth 2 (two) times daily.   multivitamin tablet Take 1 tablet by mouth daily.   MYRBETRIQ 50 MG Tb24 tablet Generic drug:  mirabegron ER Take 50 mg by mouth daily.   omeprazole 20 MG capsule Commonly known as:  PRILOSEC TAKE 1 CAPSULE BY MOUTH  DAILY   simvastatin 40 MG tablet Commonly known as:  ZOCOR TAKE 1 TABLET BY MOUTH  DAILY AT 6 PM   sulfamethoxazole-trimethoprim 800-160 MG tablet Commonly known as:  BACTRIM DS,SEPTRA DS Take 1 tablet by mouth 2 (two) times daily. For 7 days   vitamin B-6 250 MG tablet Take 1 tablet (250 mg total) by mouth daily.       Allergies: No Known Allergies  Past Medical History, Surgical history, Social history, and Family History were reviewed and updated.  Review of Systems: All other 10 point review of systems is negative.   Physical Exam:  weight is 164 lb 12 oz (74.7 kg). His oral temperature is 97.8 F (36.6 C). His blood pressure is 145/57 (abnormal) and his pulse is 64. His respiration is 16 and oxygen saturation is 100%.   Wt Readings from Last 3 Encounters:  12/28/16 164 lb 12 oz (74.7  kg)  12/03/16 169 lb 12.8 oz (77 kg)  10/22/16 166 lb 12.8 oz (75.7 kg)    Ocular: Sclerae unicteric, pupils equal, round and reactive to light Ear-nose-throat: Oropharynx clear, dentition fair Lymphatic: No cervical supraclavicular or axillary adenopathy Lungs no rales or rhonchi, good excursion bilaterally Heart regular rate and rhythm, no murmur appreciated Abd soft, nontender, positive bowel sounds, no liver or spleen tip palpated on exam, no fluid wave MSK no focal spinal tenderness, no joint edema Neuro: non-focal, well-oriented, appropriate affect Breast: Deferred   Lab Results  Component Value Date   WBC 5.0 12/28/2016   HGB 8.3 (L)  12/28/2016   HCT 24.1 (L) 12/28/2016   MCV 100 (H) 12/28/2016   PLT 483 (H) 12/28/2016   Lab Results  Component Value Date   FERRITIN 876 (H) 12/03/2016   IRON 165 (H) 12/03/2016   TIBC 200 (L) 12/03/2016   UIBC 35 (L) 12/03/2016   IRONPCTSAT 83 (H) 12/03/2016   Lab Results  Component Value Date   RETICCTPCT 1.0 11/25/2015   RBC 2.42 (L) 12/28/2016   RETICCTABS 27.2 11/25/2015   Lab Results  Component Value Date   KPAFRELGTCHN 3.09 (H) 04/22/2008   LAMBDASER 1.01 04/22/2008   KAPLAMBRATIO 3.06 (H) 04/22/2008   No results found for: Kandis Cocking, [facility] Lab Results  Component Value Date   TOTALPROTELP 6.3 04/22/2008   ALBUMINELP 66.3 (H) 04/22/2008   A1GS 3.9 04/22/2008   A2GS 8.1 04/22/2008   BETS 5.2 04/22/2008   BETA2SER 4.0 04/22/2008   GAMS 12.5 04/22/2008   MSPIKE NOT DET 04/22/2008   SPEI * 04/22/2008     Chemistry      Component Value Date/Time   NA 135 12/28/2016 1045   NA 131 (L) 07/30/2016 1039   K 4.4 12/28/2016 1045   K 4.4 07/30/2016 1039   CL 99 12/28/2016 1045   CO2 26 12/28/2016 1045   CO2 21 (L) 07/30/2016 1039   BUN 23 (H) 12/28/2016 1045   BUN 20.3 07/30/2016 1039   CREATININE 1.2 12/28/2016 1045   CREATININE 0.9 07/30/2016 1039      Component Value Date/Time   CALCIUM 8.5 12/28/2016 1045   CALCIUM 8.7 07/30/2016 1039   ALKPHOS 53 12/28/2016 1045   ALKPHOS 47 07/30/2016 1039   AST 26 12/28/2016 1045   AST 17 07/30/2016 1039   ALT 23 12/28/2016 1045   ALT 18 07/30/2016 1039   BILITOT 1.00 12/28/2016 1045   BILITOT 0.93 07/30/2016 1039     Impression and Plan: Alexander Rice is an 83 yo white male with myelodysplasia and refractory anemia with ringed sideroblasts. He has responded nicely on Procrit. His Hgb is stable at 8.3 with an MCV of 100. He is asymptomatic at this time and has no complaints.  He will get procrit today as planned.  We will plan to see him back again in 3 weeks for repeat lab work, follow-up and injection.    Both his wife and daughter know to contact our office with any questions or concerns. We can certainly see him sooner if need be.   Eliezer Bottom, NP 2/9/201811:45 AM

## 2017-01-14 ENCOUNTER — Ambulatory Visit: Payer: Medicare Other

## 2017-01-14 ENCOUNTER — Ambulatory Visit: Payer: Medicare Other | Admitting: Hematology & Oncology

## 2017-01-14 ENCOUNTER — Other Ambulatory Visit: Payer: Medicare Other

## 2017-01-18 ENCOUNTER — Other Ambulatory Visit (HOSPITAL_BASED_OUTPATIENT_CLINIC_OR_DEPARTMENT_OTHER): Payer: Medicare Other

## 2017-01-18 ENCOUNTER — Ambulatory Visit (HOSPITAL_BASED_OUTPATIENT_CLINIC_OR_DEPARTMENT_OTHER): Payer: Medicare Other

## 2017-01-18 ENCOUNTER — Ambulatory Visit (HOSPITAL_BASED_OUTPATIENT_CLINIC_OR_DEPARTMENT_OTHER): Payer: Medicare Other | Admitting: Family

## 2017-01-18 VITALS — BP 144/42 | HR 60 | Temp 98.0°F | Resp 18 | Wt 164.0 lb

## 2017-01-18 DIAGNOSIS — D510 Vitamin B12 deficiency anemia due to intrinsic factor deficiency: Secondary | ICD-10-CM | POA: Diagnosis not present

## 2017-01-18 DIAGNOSIS — N183 Chronic kidney disease, stage 3 (moderate): Secondary | ICD-10-CM | POA: Diagnosis not present

## 2017-01-18 DIAGNOSIS — D461 Refractory anemia with ring sideroblasts: Secondary | ICD-10-CM

## 2017-01-18 DIAGNOSIS — D469 Myelodysplastic syndrome, unspecified: Secondary | ICD-10-CM

## 2017-01-18 DIAGNOSIS — N189 Chronic kidney disease, unspecified: Secondary | ICD-10-CM

## 2017-01-18 DIAGNOSIS — D631 Anemia in chronic kidney disease: Secondary | ICD-10-CM

## 2017-01-18 DIAGNOSIS — D518 Other vitamin B12 deficiency anemias: Secondary | ICD-10-CM

## 2017-01-18 DIAGNOSIS — D649 Anemia, unspecified: Secondary | ICD-10-CM

## 2017-01-18 LAB — COMPREHENSIVE METABOLIC PANEL (CC13)
ALBUMIN: 4.1 g/dL (ref 3.5–4.7)
ALT: 16 IU/L (ref 0–44)
AST (SGOT): 17 IU/L (ref 0–40)
Albumin/Globulin Ratio: 2.6 — ABNORMAL HIGH (ref 1.2–2.2)
Alkaline Phosphatase, S: 54 IU/L (ref 39–117)
BILIRUBIN TOTAL: 0.6 mg/dL (ref 0.0–1.2)
BUN / CREAT RATIO: 22 (ref 10–24)
BUN: 25 mg/dL (ref 8–27)
Calcium, Ser: 8.8 mg/dL (ref 8.6–10.2)
Carbon Dioxide, Total: 23 mmol/L (ref 18–29)
Chloride, Ser: 97 mmol/L (ref 96–106)
Creatinine, Ser: 1.12 mg/dL (ref 0.76–1.27)
GFR calc non Af Amer: 61 mL/min/{1.73_m2} (ref >59)
GFR, EST AFRICAN AMERICAN: 70 mL/min/{1.73_m2} (ref >59)
GLOBULIN, TOTAL: 1.6 g/dL (ref 1.5–4.5)
GLUCOSE: 117 mg/dL — AB (ref 65–99)
Potassium, Ser: 4.5 mmol/L (ref 3.5–5.2)
Sodium: 129 mmol/L — ABNORMAL LOW (ref 134–144)
TOTAL PROTEIN: 5.7 g/dL — AB (ref 6.0–8.5)

## 2017-01-18 LAB — CBC WITH DIFFERENTIAL (CANCER CENTER ONLY)
BASO#: 0 10*3/uL (ref 0.0–0.2)
BASO%: 0.4 % (ref 0.0–2.0)
EOS ABS: 0.1 10*3/uL (ref 0.0–0.5)
EOS%: 2.4 % (ref 0.0–7.0)
HEMATOCRIT: 23.7 % — AB (ref 38.7–49.9)
HEMOGLOBIN: 8.2 g/dL — AB (ref 13.0–17.1)
LYMPH#: 1.7 10*3/uL (ref 0.9–3.3)
LYMPH%: 30.9 % (ref 14.0–48.0)
MCH: 34.6 pg — ABNORMAL HIGH (ref 28.0–33.4)
MCHC: 34.6 g/dL (ref 32.0–35.9)
MCV: 100 fL — ABNORMAL HIGH (ref 82–98)
MONO#: 0.5 10*3/uL (ref 0.1–0.9)
MONO%: 8.4 % (ref 0.0–13.0)
NEUT%: 57.9 % (ref 40.0–80.0)
NEUTROS ABS: 3.1 10*3/uL (ref 1.5–6.5)
Platelets: 501 10*3/uL — ABNORMAL HIGH (ref 145–400)
RBC: 2.37 10*6/uL — ABNORMAL LOW (ref 4.20–5.70)
RDW: 19.9 % — ABNORMAL HIGH (ref 11.1–15.7)
WBC: 5.4 10*3/uL (ref 4.0–10.0)

## 2017-01-18 MED ORDER — EPOETIN ALFA 40000 UNIT/ML IJ SOLN
INTRAMUSCULAR | Status: AC
Start: 2017-01-18 — End: 2017-01-18
  Filled 2017-01-18: qty 1

## 2017-01-18 MED ORDER — EPOETIN ALFA 40000 UNIT/ML IJ SOLN
40000.0000 [IU] | Freq: Once | INTRAMUSCULAR | Status: AC
  Administered 2017-01-18: 40000 [IU] via SUBCUTANEOUS

## 2017-01-18 NOTE — Progress Notes (Signed)
Hematology and Oncology Follow Up Visit  Alexander Rice [id number] [birth year] 83 y.o. 01/18/2017   Principle Diagnosis:  Anemia of renal insufficiency  Refractory anemia with ringed sideroblasts. Pernicious anemia  Current Therapy:   Procrit 40,000 units subcutaneous every 3 weeks Vitamin B6 250 mg by mouth daily Blood transfusions as indicated - last transfusion on 07/31/2016    Interim History: Alexander Rice is here today with his wife and daughter for a follow-up. No changes with Alexander Rice. He is still pleasantly confused and getting along well. He has had no recent falls and no syncope. He ambulates with a cane for support.  He has had no issue with frequent infections. No fever, chills, n/v, cough, rash, headache, dizziness, vision changes, chest pain, palpitations, abdominal pain or changes in bowel or bladder habits. No swelling, tenderness, numbness or tingling in his extremities. No c/o pain. No lymphadenopathy found on exam. No episodes of bleeding or bruising.  He has maintained a good appetite and his family is prompting Alexander Rice to stay hydrated. His weight is unchanged.   Medications:  Allergies as of 01/18/2017   No Known Allergies     Medication List       Accurate as of 01/18/17  3:21 PM. Always use your most recent med list.          aspirin 81 MG tablet Take 81 mg by mouth daily.   CVS OMEGA-3 KRILL OIL PO Take 350 mg by mouth daily. Reported on 01/16/2016   CVS PROBIOTIC Chew Chew by mouth daily.   cyanocobalamin 100 MCG tablet Take by mouth daily.   dorzolamide 2 % ophthalmic solution Commonly known as:  TRUSOPT Place 1 drop into both eyes 2 (two) times daily.   doxazosin 4 MG tablet Commonly known as:  CARDURA TAKE 1 TABLET BY MOUTH AT  BEDTIME   lisinopril 5 MG tablet Commonly known as:  PRINIVIL,ZESTRIL Take 1 tablet (5 mg total) by mouth daily.   LUMIGAN 0.01 % Soln Generic drug:  bimatoprost Place 1 drop into both eyes at bedtime.     memantine 10 MG tablet Commonly known as:  NAMENDA Take 1 tablet (10 mg total) by mouth 2 (two) times daily.   multivitamin tablet Take 1 tablet by mouth daily.   MYRBETRIQ 50 MG Tb24 tablet Generic drug:  mirabegron ER Take 50 mg by mouth daily.   omeprazole 20 MG capsule Commonly known as:  PRILOSEC TAKE 1 CAPSULE BY MOUTH  DAILY   simvastatin 40 MG tablet Commonly known as:  ZOCOR TAKE 1 TABLET BY MOUTH  DAILY AT 6 PM   vitamin B-6 250 MG tablet Take 1 tablet (250 mg total) by mouth daily.       Allergies: No Known Allergies  Past Medical History, Surgical history, Social history, and Family History were reviewed and updated.  Review of Systems: All other 10 point review of systems is negative.   Physical Exam:  weight is 164 lb (74.4 kg). His oral temperature is 98 F (36.7 C). His blood pressure is 144/42 (abnormal) and his pulse is 60. His respiration is 18 and oxygen saturation is 99%.   Wt Readings from Last 3 Encounters:  01/18/17 164 lb (74.4 kg)  12/28/16 164 lb 12 oz (74.7 kg)  12/03/16 169 lb 12.8 oz (77 kg)    Ocular: Sclerae unicteric, pupils equal, round and reactive to light Ear-nose-throat: Oropharynx clear, dentition fair Lymphatic: No cervical supraclavicular or axillary adenopathy Lungs no rales or rhonchi, good excursion  bilaterally Heart regular rate and rhythm, no murmur appreciated Abd soft, nontender, positive bowel sounds, no liver or spleen tip palpated on exam, no fluid wave MSK no focal spinal tenderness, no joint edema Neuro: non-focal, well-oriented, appropriate affect Breast: Deferred   Lab Results  Component Value Date   WBC 5.4 01/18/2017   HGB 8.2 (L) 01/18/2017   HCT 23.7 (L) 01/18/2017   MCV 100 (H) 01/18/2017   PLT 501 (H) 01/18/2017   Lab Results  Component Value Date   FERRITIN 1,010 (H) 12/28/2016   IRON 121 12/28/2016   TIBC 209 12/28/2016   UIBC 88 (L) 12/28/2016   IRONPCTSAT 58 (H) 12/28/2016   Lab  Results  Component Value Date   RETICCTPCT 1.0 11/25/2015   RBC 2.37 (L) 01/18/2017   RETICCTABS 27.2 11/25/2015   Lab Results  Component Value Date   KPAFRELGTCHN 3.09 (H) 04/22/2008   LAMBDASER 1.01 04/22/2008   KAPLAMBRATIO 3.06 (H) 04/22/2008   No results found for: Osborne Casco Lab Results  Component Value Date   TOTALPROTELP 6.3 04/22/2008   ALBUMINELP 66.3 (H) 04/22/2008   A1GS 3.9 04/22/2008   A2GS 8.1 04/22/2008   BETS 5.2 04/22/2008   BETA2SER 4.0 04/22/2008   GAMS 12.5 04/22/2008   MSPIKE NOT DET 04/22/2008   SPEI * 04/22/2008     Chemistry      Component Value Date/Time   NA 135 12/28/2016 1045   NA 131 (L) 07/30/2016 1039   K 4.4 12/28/2016 1045   K 4.4 07/30/2016 1039   CL 99 12/28/2016 1045   CO2 26 12/28/2016 1045   CO2 21 (L) 07/30/2016 1039   BUN 23 (H) 12/28/2016 1045   BUN 20.3 07/30/2016 1039   CREATININE 1.2 12/28/2016 1045   CREATININE 0.9 07/30/2016 1039      Component Value Date/Time   CALCIUM 8.5 12/28/2016 1045   CALCIUM 8.7 07/30/2016 1039   ALKPHOS 53 12/28/2016 1045   ALKPHOS 47 07/30/2016 1039   AST 26 12/28/2016 1045   AST 17 07/30/2016 1039   ALT 23 12/28/2016 1045   ALT 18 07/30/2016 1039   BILITOT 1.00 12/28/2016 1045   BILITOT 0.93 07/30/2016 1039     Impression and Plan: Alexander Rice is an 83 yo white male with myelodysplasia and refractory anemia with ringed sideroblasts. He has responded nicely on Procrit and his counts have remained stable. Hgb today is 8.2 with an MCV of 100. He is asymptomatic at this time and has no complaints.  He will get procrit today as planned.  We will plan to see Alexander Rice back again in 3 weeks for repeat lab work, follow-up and injection.  Both his wife and daughter know to contact our office with any questions or concerns. We can certainly see Alexander Rice sooner if need be.   Eliezer Bottom, NP 3/2/20183:21 PM

## 2017-01-18 NOTE — Patient Instructions (Signed)
Epoetin Alfa injection °What is this medicine? °EPOETIN ALFA (e POE e tin AL fa) helps your body make more red blood cells. This medicine is used to treat anemia caused by chronic kidney failure, cancer chemotherapy, or HIV-therapy. It may also be used before surgery if you have anemia. °This medicine may be used for other purposes; ask your health care provider or pharmacist if you have questions. °COMMON BRAND NAME(S): Epogen, Procrit °What should I tell my health care provider before I take this medicine? °They need to know if you have any of these conditions: °-blood clotting disorders °-cancer patient not on chemotherapy °-cystic fibrosis °-heart disease, such as angina or heart failure °-hemoglobin level of 12 g/dL or greater °-high blood pressure °-low levels of folate, iron, or vitamin B12 °-seizures °-an unusual or allergic reaction to erythropoietin, albumin, benzyl alcohol, hamster proteins, other medicines, foods, dyes, or preservatives °-pregnant or trying to get pregnant °-breast-feeding °How should I use this medicine? °This medicine is for injection into a vein or under the skin. It is usually given by a health care professional in a hospital or clinic setting. °If you get this medicine at home, you will be taught how to prepare and give this medicine. Use exactly as directed. Take your medicine at regular intervals. Do not take your medicine more often than directed. °It is important that you put your used needles and syringes in a special sharps container. Do not put them in a trash can. If you do not have a sharps container, call your pharmacist or healthcare provider to get one. °A special MedGuide will be given to you by the pharmacist with each prescription and refill. Be sure to read this information carefully each time. °Talk to your pediatrician regarding the use of this medicine in children. While this drug may be prescribed for selected conditions, precautions do apply. °Overdosage: If you  think you have taken too much of this medicine contact a poison control center or emergency room at once. °NOTE: This medicine is only for you. Do not share this medicine with others. °What if I miss a dose? °If you miss a dose, take it as soon as you can. If it is almost time for your next dose, take only that dose. Do not take double or extra doses. °What may interact with this medicine? °Do not take this medicine with any of the following medications: °-darbepoetin alfa °This list may not describe all possible interactions. Give your health care provider a list of all the medicines, herbs, non-prescription drugs, or dietary supplements you use. Also tell them if you smoke, drink alcohol, or use illegal drugs. Some items may interact with your medicine. °What should I watch for while using this medicine? °Your condition will be monitored carefully while you are receiving this medicine. °You may need blood work done while you are taking this medicine. °What side effects may I notice from receiving this medicine? °Side effects that you should report to your doctor or health care professional as soon as possible: °-allergic reactions like skin rash, itching or hives, swelling of the face, lips, or tongue °-breathing problems °-changes in vision °-chest pain °-confusion, trouble speaking or understanding °-feeling faint or lightheaded, falls °-high blood pressure °-muscle aches or pains °-pain, swelling, warmth in the leg °-rapid weight gain °-severe headaches °-sudden numbness or weakness of the face, arm or leg °-trouble walking, dizziness, loss of balance or coordination °-seizures (convulsions) °-swelling of the ankles, feet, hands °-unusually weak or tired °  Side effects that usually do not require medical attention (report to your doctor or health care professional if they continue or are bothersome): °-diarrhea °-fever, chills (flu-like symptoms) °-headaches °-nausea, vomiting °-redness, stinging, or swelling at  site where injected °This list may not describe all possible side effects. Call your doctor for medical advice about side effects. You may report side effects to FDA at 1-800-FDA-1088. °Where should I keep my medicine? °Keep out of the reach of children. °Store in a refrigerator between 2 and 8 degrees C (36 and 46 degrees F). Do not freeze or shake. Throw away any unused portion if using a single-dose vial. Multi-dose vials can be kept in the refrigerator for up to 21 days after the initial dose. Throw away unused medicine. °NOTE: This sheet is a summary. It may not cover all possible information. If you have questions about this medicine, talk to your doctor, pharmacist, or health care provider. °© 2018 [employer] (2016-06-25 19:42:31) ° °

## 2017-01-21 LAB — IRON AND TIBC
%SAT: 66 % — AB (ref 20–55)
Iron: 138 ug/dL (ref 42–163)
TIBC: 210 ug/dL (ref 202–409)
UIBC: 72 ug/dL — ABNORMAL LOW (ref 117–376)

## 2017-01-21 LAB — FERRITIN: Ferritin: 867 ng/ml — ABNORMAL HIGH (ref 22–316)

## 2017-01-23 DIAGNOSIS — N3281 Overactive bladder: Secondary | ICD-10-CM | POA: Diagnosis not present

## 2017-01-31 ENCOUNTER — Ambulatory Visit (INDEPENDENT_AMBULATORY_CARE_PROVIDER_SITE_OTHER): Payer: Medicare Other | Admitting: Family Medicine

## 2017-01-31 ENCOUNTER — Encounter: Payer: Self-pay | Admitting: Family Medicine

## 2017-01-31 DIAGNOSIS — I10 Essential (primary) hypertension: Secondary | ICD-10-CM

## 2017-01-31 DIAGNOSIS — E785 Hyperlipidemia, unspecified: Secondary | ICD-10-CM

## 2017-01-31 DIAGNOSIS — I25119 Atherosclerotic heart disease of native coronary artery with unspecified angina pectoris: Secondary | ICD-10-CM | POA: Diagnosis not present

## 2017-01-31 NOTE — Assessment & Plan Note (Signed)
S: compliant with ASA, simvastatin. Daughter unsure if has had to use nitroglycerin recently. Imdur stopped in past due to hypotension. Does have some shortness of breath with activity but this could be due to anemia issues as well A/P: seems to be doing well, will monitor

## 2017-01-31 NOTE — Progress Notes (Signed)
Pre visit review using our clinic review tool, if applicable. No additional management support is needed unless otherwise documented below in the visit note. 

## 2017-01-31 NOTE — Progress Notes (Signed)
Subjective:  Alexander Rice is a 83 y.o. year old very pleasant male patient who presents for/with See problem oriented charting ROS- some shortness of breath, denies chest pain, some fatigue. Confusion that is worse at times.   Past Medical History-  Patient Active Problem List   Diagnosis Date Noted  . Bradycardia 04/27/2016    Priority: High  . Unresponsiveness 04/27/2016    Priority: High  . Moderate dementia, without behavioral disturbance 03/31/2015    Priority: High  . MDS (myelodysplastic syndrome) ([place]) 05/14/2011    Priority: High  . CAD (coronary artery disease) 09/29/2006    Priority: High  . Hyperlipidemia 09/29/2006    Priority: Medium  . Essential hypertension 09/29/2006    Priority: Medium  . BPH (benign prostatic hyperplasia) 09/29/2006    Priority: Medium  . Spinal stenosis of lumbar region 03/31/2015    Priority: Low  . Pernicious anemia 03/11/2015    Priority: Low  . Glaucoma 12/01/2014    Priority: Low  . Pulmonary nodule 04/08/2012    Priority: Low  . ANEMIA, B12 DEFICIENCY 07/03/2007    Priority: Low  . GERD 09/29/2006    Priority: Low    Medications- reviewed and updated Current Outpatient Prescriptions  Medication Sig Dispense Refill  . aspirin 81 MG tablet Take 81 mg by mouth daily.      . [facility] OMEGA-3 KRILL OIL PO Take 350 mg by mouth daily. Reported on 01/16/2016    . cyanocobalamin 100 MCG tablet Take by mouth daily.    . dorzolamide (TRUSOPT) 2 % ophthalmic solution Place 1 drop into both eyes 2 (two) times daily.      [name] doxazosin (CARDURA) 4 MG tablet TAKE 1 TABLET BY MOUTH AT  BEDTIME 90 tablet 3  . lisinopril (PRINIVIL,ZESTRIL) 5 MG tablet Take 1 tablet (5 mg total) by mouth daily. 90 tablet 3  . LUMIGAN 0.01 % SOLN Place 1 drop into both eyes at bedtime.     . memantine (NAMENDA) 10 MG tablet Take 1 tablet (10 mg total) by mouth 2 (two) times daily. 60 tablet 5  . Multiple Vitamin (MULTIVITAMIN) tablet Take 1 tablet by mouth daily.       [name] omeprazole (PRILOSEC) 20 MG capsule TAKE 1 CAPSULE BY MOUTH  DAILY 90 capsule 1  . Probiotic Product ([facility] PROBIOTIC) CHEW Chew by mouth daily.    . Pyridoxine HCl (VITAMIN B-6) 250 MG tablet Take 1 tablet (250 mg total) by mouth daily. 90 tablet 3  . simvastatin (ZOCOR) 40 MG tablet TAKE 1 TABLET BY MOUTH  DAILY AT 6 PM 90 tablet 1   No current facility-administered medications for this visit.    Facility-Administered Medications Ordered in Other Visits  Medication Dose Route Frequency Provider Last Rate Last Dose  . darbepoetin (ARANESP) injection 300 mcg  300 mcg Subcutaneous Once [name], MD        Objective: BP (!) 128/52 (BP Location: Left Arm, Patient Position: Sitting, Cuff Size: Normal)   Pulse 71   Temp 97.3 F (36.3 C) (Oral)   Ht 5\' 8"  (1.727 m)   Wt 165 lb 6.4 oz (75 kg)   SpO2 99%   BMI 25.15 kg/m  Gen: NAD, resting comfortably CV: RRR no murmurs rubs or gallops Lungs: CTAB no crackles, wheeze, rhonchi Ext: no edema under compression stockings Skin: warm, dry Neuro: pleasantly confused, walks with walker  Assessment/Plan:  Essential hypertension S: controlled on lisnopril 5mg  and cardura 4mg .  BP Readings from  Last 3 Encounters:  01/31/17 (!) 128/52  01/18/17 (!) 144/42  12/28/16 (!) 145/57  A/P:Continue current meds:  Bp seems to have decreased sine being Off mybetriq. He is tolerating <140/90 at present. Could consider reducing medication if needed in future due to orthostatic problems (see unresponsive section from prior notes) in the past  CAD (coronary artery disease) S: compliant with ASA, simvastatin. Daughter unsure if has had to use nitroglycerin recently. Imdur stopped in past due to hypotension. Does have some shortness of breath with activity but this could be due to anemia issues as well A/P: seems to be doing well, will monitor   Hyperlipidemia S: well controlled on simvastatin 40mg . No myalgias.  Lab Results  Component Value  Date   CHOL 117 04/18/2016   HDL 58.80 04/18/2016   LDLCALC 49 04/18/2016   TRIG 46.0 04/18/2016   CHOLHDL 2 04/18/2016   A/P: LDL under 70 at goal with CAD history   04/19/2017--> has follow up visit. Daughter mentions CPE- may be AWV atually  Return precautions advised.  [name], MD

## 2017-01-31 NOTE — Assessment & Plan Note (Addendum)
S: controlled on lisnopril 5mg  and cardura 4mg .  BP Readings from Last 3 Encounters:  01/31/17 (!) 128/52  01/18/17 (!) 144/42  12/28/16 (!) 145/57  A/P:Continue current meds:  Bp seems to have decreased sine being Off mybetriq. He is tolerating <140/90 at present. Could consider reducing medication if needed in future due to orthostatic problems (see unresponsive section from prior notes) in the past

## 2017-01-31 NOTE — Assessment & Plan Note (Signed)
S: well controlled on simvastatin 40mg . No myalgias.  Lab Results  Component Value Date   CHOL 117 04/18/2016   HDL 58.80 04/18/2016   LDLCALC 49 04/18/2016   TRIG 46.0 04/18/2016   CHOLHDL 2 04/18/2016   A/P: LDL under 70 at goal with CAD history

## 2017-01-31 NOTE — Patient Instructions (Signed)
No changes today- look forward to seeing you in a few months!   Glad blood pressure is better- may be due to being off myrbetriq

## 2017-02-04 ENCOUNTER — Ambulatory Visit: Payer: Medicare Other | Admitting: Hematology & Oncology

## 2017-02-04 ENCOUNTER — Other Ambulatory Visit: Payer: Medicare Other

## 2017-02-04 ENCOUNTER — Ambulatory Visit: Payer: Medicare Other

## 2017-02-05 ENCOUNTER — Ambulatory Visit: Payer: Medicare Other

## 2017-02-05 ENCOUNTER — Other Ambulatory Visit: Payer: Medicare Other

## 2017-02-05 ENCOUNTER — Ambulatory Visit: Payer: Medicare Other | Admitting: Hematology & Oncology

## 2017-02-11 ENCOUNTER — Ambulatory Visit (HOSPITAL_BASED_OUTPATIENT_CLINIC_OR_DEPARTMENT_OTHER): Payer: Medicare Other

## 2017-02-11 ENCOUNTER — Other Ambulatory Visit (HOSPITAL_BASED_OUTPATIENT_CLINIC_OR_DEPARTMENT_OTHER): Payer: Medicare Other

## 2017-02-11 ENCOUNTER — Ambulatory Visit (HOSPITAL_BASED_OUTPATIENT_CLINIC_OR_DEPARTMENT_OTHER): Payer: Medicare Other | Admitting: Family

## 2017-02-11 VITALS — BP 149/51 | HR 67 | Temp 97.5°F | Resp 18 | Wt 164.1 lb

## 2017-02-11 DIAGNOSIS — D631 Anemia in chronic kidney disease: Secondary | ICD-10-CM

## 2017-02-11 DIAGNOSIS — D518 Other vitamin B12 deficiency anemias: Secondary | ICD-10-CM

## 2017-02-11 DIAGNOSIS — D461 Refractory anemia with ring sideroblasts: Secondary | ICD-10-CM | POA: Diagnosis not present

## 2017-02-11 DIAGNOSIS — D510 Vitamin B12 deficiency anemia due to intrinsic factor deficiency: Secondary | ICD-10-CM

## 2017-02-11 DIAGNOSIS — N189 Chronic kidney disease, unspecified: Secondary | ICD-10-CM

## 2017-02-11 DIAGNOSIS — D469 Myelodysplastic syndrome, unspecified: Secondary | ICD-10-CM

## 2017-02-11 LAB — CBC WITH DIFFERENTIAL (CANCER CENTER ONLY)
BASO#: 0 10*3/uL (ref 0.0–0.2)
BASO%: 0.3 % (ref 0.0–2.0)
EOS%: 2.3 % (ref 0.0–7.0)
Eosinophils Absolute: 0.1 10*3/uL (ref 0.0–0.5)
HEMATOCRIT: 23.7 % — AB (ref 38.7–49.9)
HGB: 8.1 g/dL — ABNORMAL LOW (ref 13.0–17.1)
LYMPH#: 1.8 10*3/uL (ref 0.9–3.3)
LYMPH%: 29 % (ref 14.0–48.0)
MCH: 34.5 pg — ABNORMAL HIGH (ref 28.0–33.4)
MCHC: 34.2 g/dL (ref 32.0–35.9)
MCV: 101 fL — AB (ref 82–98)
MONO#: 0.6 10*3/uL (ref 0.1–0.9)
MONO%: 8.9 % (ref 0.0–13.0)
NEUT%: 59.5 % (ref 40.0–80.0)
NEUTROS ABS: 3.7 10*3/uL (ref 1.5–6.5)
PLATELETS: 488 10*3/uL — AB (ref 145–400)
RBC: 2.35 10*6/uL — AB (ref 4.20–5.70)
RDW: 20.1 % — ABNORMAL HIGH (ref 11.1–15.7)
WBC: 6.2 10*3/uL (ref 4.0–10.0)

## 2017-02-11 LAB — CMP (CANCER CENTER ONLY)
ALBUMIN: 3.4 g/dL (ref 3.3–5.5)
ALT(SGPT): 25 U/L (ref 10–47)
AST: 24 U/L (ref 11–38)
Alkaline Phosphatase: 50 U/L (ref 26–84)
BILIRUBIN TOTAL: 0.8 mg/dL (ref 0.20–1.60)
BUN, Bld: 23 mg/dL — ABNORMAL HIGH (ref 7–22)
CO2: 25 meq/L (ref 18–33)
CREATININE: 1 mg/dL (ref 0.6–1.2)
Calcium: 8.6 mg/dL (ref 8.0–10.3)
Chloride: 99 mEq/L (ref 98–108)
GLUCOSE: 103 mg/dL (ref 73–118)
Potassium: 4.1 mEq/L (ref 3.3–4.7)
SODIUM: 134 meq/L (ref 128–145)
Total Protein: 5.6 g/dL — ABNORMAL LOW (ref 6.4–8.1)

## 2017-02-11 MED ORDER — EPOETIN ALFA 40000 UNIT/ML IJ SOLN
INTRAMUSCULAR | Status: AC
  Filled 2017-02-11: qty 1

## 2017-02-11 MED ORDER — EPOETIN ALFA 40000 UNIT/ML IJ SOLN
40000.0000 [IU] | Freq: Once | INTRAMUSCULAR | Status: AC
  Administered 2017-02-11: 40000 [IU] via SUBCUTANEOUS

## 2017-02-11 NOTE — Patient Instructions (Signed)
Epoetin Alfa injection °What is this medicine? °EPOETIN ALFA (e POE e tin AL fa) helps your body make more red blood cells. This medicine is used to treat anemia caused by chronic kidney failure, cancer chemotherapy, or HIV-therapy. It may also be used before surgery if you have anemia. °This medicine may be used for other purposes; ask your health care provider or pharmacist if you have questions. °COMMON BRAND NAME(S): Epogen, Procrit °What should I tell my health care provider before I take this medicine? °They need to know if you have any of these conditions: °-blood clotting disorders °-cancer patient not on chemotherapy °-cystic fibrosis °-heart disease, such as angina or heart failure °-hemoglobin level of 12 g/dL or greater °-high blood pressure °-low levels of folate, iron, or vitamin B12 °-seizures °-an unusual or allergic reaction to erythropoietin, albumin, benzyl alcohol, hamster proteins, other medicines, foods, dyes, or preservatives °-pregnant or trying to get pregnant °-breast-feeding °How should I use this medicine? °This medicine is for injection into a vein or under the skin. It is usually given by a health care professional in a hospital or clinic setting. °If you get this medicine at home, you will be taught how to prepare and give this medicine. Use exactly as directed. Take your medicine at regular intervals. Do not take your medicine more often than directed. °It is important that you put your used needles and syringes in a special sharps container. Do not put them in a trash can. If you do not have a sharps container, call your pharmacist or healthcare provider to get one. °A special MedGuide will be given to you by the pharmacist with each prescription and refill. Be sure to read this information carefully each time. °Talk to your pediatrician regarding the use of this medicine in children. While this drug may be prescribed for selected conditions, precautions do apply. °Overdosage: If you  think you have taken too much of this medicine contact a poison control center or emergency room at once. °NOTE: This medicine is only for you. Do not share this medicine with others. °What if I miss a dose? °If you miss a dose, take it as soon as you can. If it is almost time for your next dose, take only that dose. Do not take double or extra doses. °What may interact with this medicine? °Do not take this medicine with any of the following medications: °-darbepoetin alfa °This list may not describe all possible interactions. Give your health care provider a list of all the medicines, herbs, non-prescription drugs, or dietary supplements you use. Also tell them if you smoke, drink alcohol, or use illegal drugs. Some items may interact with your medicine. °What should I watch for while using this medicine? °Your condition will be monitored carefully while you are receiving this medicine. °You may need blood work done while you are taking this medicine. °What side effects may I notice from receiving this medicine? °Side effects that you should report to your doctor or health care professional as soon as possible: °-allergic reactions like skin rash, itching or hives, swelling of the face, lips, or tongue °-breathing problems °-changes in vision °-chest pain °-confusion, trouble speaking or understanding °-feeling faint or lightheaded, falls °-high blood pressure °-muscle aches or pains °-pain, swelling, warmth in the leg °-rapid weight gain °-severe headaches °-sudden numbness or weakness of the face, arm or leg °-trouble walking, dizziness, loss of balance or coordination °-seizures (convulsions) °-swelling of the ankles, feet, hands °-unusually weak or tired °  Side effects that usually do not require medical attention (report to your doctor or health care professional if they continue or are bothersome): °-diarrhea °-fever, chills (flu-like symptoms) °-headaches °-nausea, vomiting °-redness, stinging, or swelling at  site where injected °This list may not describe all possible side effects. Call your doctor for medical advice about side effects. You may report side effects to FDA at 1-800-FDA-1088. °Where should I keep my medicine? °Keep out of the reach of children. °Store in a refrigerator between 2 and 8 degrees C (36 and 46 degrees F). Do not freeze or shake. Throw away any unused portion if using a single-dose vial. Multi-dose vials can be kept in the refrigerator for up to 21 days after the initial dose. Throw away unused medicine. °NOTE: This sheet is a summary. It may not cover all possible information. If you have questions about this medicine, talk to your doctor, pharmacist, or health care provider. °© 2018 [employer] (2016-06-25 19:42:31) ° °

## 2017-02-11 NOTE — Progress Notes (Signed)
Hematology and Oncology Follow Up Visit  Alexander Rice [id number] 1932-09-16 83 y.o. 02/11/2017   Principle Diagnosis:  Anemia of renal insufficiency  Refractory anemia with ringed sideroblasts. Pernicious anemia  Current Therapy:   Procrit 40,000 units subcutaneous every 3 weeks Vitamin B6 250 mg by mouth daily Blood transfusions as indicated - last transfusion on 07/31/2016    Interim History: Alexander Rice is here today with his wife and daughter for a follow-up. He continues to do well and has no complaints at this time. He is pleasantly confused so some information was obtained from his family. They feel that he is resting well at night and not napping through the day.  His Hgb is 8.1 with an MCV of 101. Iron studies are pending.  He has maintained a good appetite and is staying well hydrated. His weight is stable.  There has been no issue with recent infection. No fever, chills, n/v, cough, rash, headache, dizziness, vision changes, chest pain, palpitations, abdominal pain or changes in bowel or bladder habits. No swelling, tenderness, numbness or tingling in his extremities. No c/o pain. No lymphadenopathy found on exam. No episodes of bleeding or bruising.   Medications:  Allergies as of 02/11/2017   No Known Allergies     Medication List       Accurate as of 02/11/17  3:23 PM. Always use your most recent med list.          aspirin 81 MG tablet Take 81 mg by mouth daily.   [facility]S OMEGA-3 KRILL OIL PO Take 350 mg by mouth daily. Reported on 01/16/2016   [facility]S PROBIOTIC Chew Chew by mouth daily.   cyanocobalamin 100 MCG tablet Take by mouth daily.   dorzolamide 2 % ophthalmic solution Commonly known as:  TRUSOPT Place 1 drop into both eyes 2 (two) times daily.   doxazosin 4 MG tablet Commonly known as:  CARDURA TAKE 1 TABLET BY MOUTH AT  BEDTIME   lisinopril 5 MG tablet Commonly known as:  PRINIVIL,ZESTRIL Take 1 tablet (5 mg total) by mouth daily.     LUMIGAN 0.01 % Soln Generic drug:  bimatoprost Place 1 drop into both eyes at bedtime.   memantine 10 MG tablet Commonly known as:  NAMENDA Take 1 tablet (10 mg total) by mouth 2 (two) times daily.   multivitamin tablet Take 1 tablet by mouth daily.   omeprazole 20 MG capsule Commonly known as:  PRILOSEC TAKE 1 CAPSULE BY MOUTH  DAILY   simvastatin 40 MG tablet Commonly known as:  ZOCOR TAKE 1 TABLET BY MOUTH  DAILY AT 6 PM   vitamin B-6 250 MG tablet Take 1 tablet (250 mg total) by mouth daily.       Allergies: No Known Allergies  Past Medical History, Surgical history, Social history, and Family History were reviewed and updated.  Review of Systems: All other 10 point review of systems is negative.   Physical Exam:  weight is 164 lb 1.3 oz (74.4 kg). His oral temperature is 97.5 F (36.4 C). His blood pressure is 149/51 (abnormal) and his pulse is 67. His respiration is 18 and oxygen saturation is 100%.   Wt Readings from Last 3 Encounters:  02/11/17 164 lb 1.3 oz (74.4 kg)  01/31/17 165 lb 6.4 oz (75 kg)  01/18/17 164 lb (74.4 kg)    Ocular: Sclerae unicteric, pupils equal, round and reactive to light Ear-nose-throat: Oropharynx clear, dentition fair Lymphatic: No cervical supraclavicular or axillary adenopathy Lungs no rales  or rhonchi, good excursion bilaterally Heart regular rate and rhythm, no murmur appreciated Abd soft, nontender, positive bowel sounds, no liver or spleen tip palpated on exam, no fluid wave MSK no focal spinal tenderness, no joint edema Neuro: non-focal, well-oriented, appropriate affect Breast: Deferred   Lab Results  Component Value Date   WBC 6.2 02/11/2017   HGB 8.1 (L) 02/11/2017   HCT 23.7 (L) 02/11/2017   MCV 101 (H) 02/11/2017   PLT 488 (H) 02/11/2017   Lab Results  Component Value Date   FERRITIN 867 (H) 01/18/2017   IRON 138 01/18/2017   TIBC 210 01/18/2017   UIBC 72 (L) 01/18/2017   IRONPCTSAT 66 (H) 01/18/2017    Lab Results  Component Value Date   RETICCTPCT 1.0 11/25/2015   RBC 2.35 (L) 02/11/2017   RETICCTABS 27.2 11/25/2015   Lab Results  Component Value Date   KPAFRELGTCHN 3.09 (H) 04/22/2008   LAMBDASER 1.01 04/22/2008   KAPLAMBRATIO 3.06 (H) 04/22/2008   No results found for: Kandis Cocking, IGMSERUM Lab Results  Component Value Date   TOTALPROTELP 6.3 04/22/2008   ALBUMINELP 66.3 (H) 04/22/2008   A1GS 3.9 04/22/2008   A2GS 8.1 04/22/2008   BETS 5.2 04/22/2008   BETA2SER 4.0 04/22/2008   GAMS 12.5 04/22/2008   MSPIKE NOT DET 04/22/2008   SPEI * 04/22/2008     Chemistry      Component Value Date/Time   NA 129 (L) 01/18/2017 1421   NA 135 12/28/2016 1045   NA 131 (L) 07/30/2016 1039   K 4.5 01/18/2017 1421   K 4.4 12/28/2016 1045   K 4.4 07/30/2016 1039   CL 97 01/18/2017 1421   CL 99 12/28/2016 1045   CO2 23 01/18/2017 1421   CO2 26 12/28/2016 1045   CO2 21 (L) 07/30/2016 1039   BUN 25 01/18/2017 1421   BUN 23 (H) 12/28/2016 1045   BUN 20.3 07/30/2016 1039   CREATININE 1.12 01/18/2017 1421   CREATININE 1.2 12/28/2016 1045   CREATININE 0.9 07/30/2016 1039      Component Value Date/Time   CALCIUM 8.8 01/18/2017 1421   CALCIUM 8.5 12/28/2016 1045   CALCIUM 8.7 07/30/2016 1039   ALKPHOS 54 01/18/2017 1421   ALKPHOS 53 12/28/2016 1045   ALKPHOS 47 07/30/2016 1039   AST 17 01/18/2017 1421   AST 26 12/28/2016 1045   AST 17 07/30/2016 1039   ALT 16 01/18/2017 1421   ALT 23 12/28/2016 1045   ALT 18 07/30/2016 1039   BILITOT 0.6 01/18/2017 1421   BILITOT 1.00 12/28/2016 1045   BILITOT 0.93 07/30/2016 1039     Impression and Plan: Alexander Rice is an 83 yo white male with myelodysplasia and refractory anemia with ringed sideroblasts. He has responded nicely on Procrit and his counts have remained stable. Hgb today is 8.1 with an MCV of 101. He will receive Procrit today. Iron studies are pending.  We will plan to see him back again in 3 weeks for repeat lab work,  follow-up and injection.  Both his wife and daughter know to contact our office with any questions or concerns. We can certainly see him sooner if need be.   Eliezer Bottom, NP 3/26/20183:23 PM

## 2017-02-12 LAB — FERRITIN: FERRITIN: 841 ng/mL — AB (ref 22–316)

## 2017-02-12 LAB — IRON AND TIBC
%SAT: 84 % — ABNORMAL HIGH (ref 20–55)
Iron: 166 ug/dL — ABNORMAL HIGH (ref 42–163)
TIBC: 199 ug/dL — ABNORMAL LOW (ref 202–409)
UIBC: 33 ug/dL — ABNORMAL LOW (ref 117–376)

## 2017-03-04 ENCOUNTER — Other Ambulatory Visit (HOSPITAL_BASED_OUTPATIENT_CLINIC_OR_DEPARTMENT_OTHER): Payer: Medicare Other

## 2017-03-04 ENCOUNTER — Ambulatory Visit (HOSPITAL_BASED_OUTPATIENT_CLINIC_OR_DEPARTMENT_OTHER): Payer: Medicare Other

## 2017-03-04 ENCOUNTER — Ambulatory Visit (HOSPITAL_BASED_OUTPATIENT_CLINIC_OR_DEPARTMENT_OTHER): Payer: Medicare Other | Admitting: Hematology & Oncology

## 2017-03-04 VITALS — BP 145/44 | HR 71 | Temp 97.7°F | Resp 16 | Wt 170.4 lb

## 2017-03-04 DIAGNOSIS — M1990 Unspecified osteoarthritis, unspecified site: Secondary | ICD-10-CM | POA: Diagnosis not present

## 2017-03-04 DIAGNOSIS — D631 Anemia in chronic kidney disease: Secondary | ICD-10-CM

## 2017-03-04 DIAGNOSIS — M810 Age-related osteoporosis without current pathological fracture: Secondary | ICD-10-CM

## 2017-03-04 DIAGNOSIS — D461 Refractory anemia with ring sideroblasts: Secondary | ICD-10-CM

## 2017-03-04 DIAGNOSIS — F0390 Unspecified dementia without behavioral disturbance: Secondary | ICD-10-CM

## 2017-03-04 DIAGNOSIS — N189 Chronic kidney disease, unspecified: Secondary | ICD-10-CM

## 2017-03-04 DIAGNOSIS — D469 Myelodysplastic syndrome, unspecified: Secondary | ICD-10-CM

## 2017-03-04 DIAGNOSIS — D510 Vitamin B12 deficiency anemia due to intrinsic factor deficiency: Secondary | ICD-10-CM

## 2017-03-04 DIAGNOSIS — D518 Other vitamin B12 deficiency anemias: Secondary | ICD-10-CM

## 2017-03-04 DIAGNOSIS — D460 Refractory anemia without ring sideroblasts, so stated: Secondary | ICD-10-CM | POA: Diagnosis not present

## 2017-03-04 LAB — COMPREHENSIVE METABOLIC PANEL (CC13)
A/G RATIO: 2.1 (ref 1.2–2.2)
ALBUMIN: 3.8 g/dL (ref 3.5–4.7)
ALT: 17 IU/L (ref 0–44)
AST (SGOT): 14 IU/L (ref 0–40)
Alkaline Phosphatase, S: 48 IU/L (ref 39–117)
BUN / CREAT RATIO: 24 (ref 10–24)
BUN: 27 mg/dL (ref 8–27)
Bilirubin Total: 0.6 mg/dL (ref 0.0–1.2)
CALCIUM: 8.3 mg/dL — AB (ref 8.6–10.2)
CO2: 23 mmol/L (ref 18–29)
CREATININE: 1.13 mg/dL (ref 0.76–1.27)
Chloride, Ser: 99 mmol/L (ref 96–106)
GFR, EST AFRICAN AMERICAN: 70 mL/min/{1.73_m2} (ref >59)
GFR, EST NON AFRICAN AMERICAN: 60 mL/min/{1.73_m2} (ref >59)
GLOBULIN, TOTAL: 1.8 g/dL (ref 1.5–4.5)
Glucose: 113 mg/dL — ABNORMAL HIGH (ref 65–99)
Potassium, Ser: 4.9 mmol/L (ref 3.5–5.2)
SODIUM: 130 mmol/L — AB (ref 134–144)
Total Protein: 5.6 g/dL — ABNORMAL LOW (ref 6.0–8.5)

## 2017-03-04 LAB — CBC WITH DIFFERENTIAL (CANCER CENTER ONLY)
BASO#: 0 10*3/uL (ref 0.0–0.2)
BASO%: 0.2 % (ref 0.0–2.0)
EOS%: 2.6 % (ref 0.0–7.0)
Eosinophils Absolute: 0.2 10*3/uL (ref 0.0–0.5)
HEMATOCRIT: 23.3 % — AB (ref 38.7–49.9)
HGB: 7.9 g/dL — ABNORMAL LOW (ref 13.0–17.1)
LYMPH#: 1.8 10*3/uL (ref 0.9–3.3)
LYMPH%: 30.6 % (ref 14.0–48.0)
MCH: 33.9 pg — ABNORMAL HIGH (ref 28.0–33.4)
MCHC: 33.9 g/dL (ref 32.0–35.9)
MCV: 100 fL — AB (ref 82–98)
MONO#: 0.5 10*3/uL (ref 0.1–0.9)
MONO%: 7.8 % (ref 0.0–13.0)
NEUT#: 3.4 10*3/uL (ref 1.5–6.5)
NEUT%: 58.8 % (ref 40.0–80.0)
PLATELETS: 431 10*3/uL — AB (ref 145–400)
RBC: 2.33 10*6/uL — ABNORMAL LOW (ref 4.20–5.70)
RDW: 21.1 % — ABNORMAL HIGH (ref 11.1–15.7)
WBC: 5.8 10*3/uL (ref 4.0–10.0)

## 2017-03-04 MED ORDER — EPOETIN ALFA 40000 UNIT/ML IJ SOLN
40000.0000 [IU] | Freq: Once | INTRAMUSCULAR | Status: AC
  Administered 2017-03-04: 40000 [IU] via SUBCUTANEOUS

## 2017-03-04 NOTE — Progress Notes (Signed)
As well as bone or  Hematology and Oncology Follow Up Visit  Alexander Rice [id number] 1931/12/02 83 y.o. 03/04/2017   Principle Diagnosis:   Refractory anemia with ringed sideroblasts.  Pernicious anemia  Severe osteoarthritis  Current Therapy:    Procrit 40,000 units subcutaneous every 3 weeks  Vitamin B6 250 mg by mouth daily  Vitamin B-12 1 mg IM every month-done at home  Blood transfusions as indicated-last transfusion on 07/31/2016     Interim History:  Alexander Rice is back for follow-up.  He is doing okay. He comes in with his wife and daughter. They are doing okay. He did have a nice Easter. He does not remember a lot about Easter.  He still has the dementia. However, he is incredibly pleasant.   His dementia is really his biggest problem. He really has very little memory. He is very pleasant. I know that he tries to remember but he just can't.   His iron studies today show his ferritin to be 841.  His iron saturation is 84%.   He has had no obvious bleeding.   He does have bad arthritis. He uses a cane to get around. He has some bad osteoporosis. I think he is taking his vitamin D. His family really has to make sure that he takes his medications.  Overall, his performance status is ECOG 3.  Medications:  Current Outpatient Prescriptions:  .  aspirin 81 MG tablet, Take 81 mg by mouth daily.  , Disp: , Rfl:  .  [facility] OMEGA-3 KRILL OIL PO, Take 350 mg by mouth daily. Reported on 01/16/2016, Disp: , Rfl:  .  cyanocobalamin 100 MCG tablet, Take by mouth daily., Disp: , Rfl:  .  dorzolamide (TRUSOPT) 2 % ophthalmic solution, Place 1 drop into both eyes 2 (two) times daily.  , Disp: , Rfl:  .  doxazosin (CARDURA) 4 MG tablet, TAKE 1 TABLET BY MOUTH AT  BEDTIME, Disp: 90 tablet, Rfl: 3 .  lisinopril (PRINIVIL,ZESTRIL) 5 MG tablet, Take 1 tablet (5 mg total) by mouth daily., Disp: 90 tablet, Rfl: 3 .  LUMIGAN 0.01 % SOLN, Place 1 drop into both eyes at bedtime. , Disp:  , Rfl:  .  memantine (NAMENDA) 10 MG tablet, Take 1 tablet (10 mg total) by mouth 2 (two) times daily., Disp: 60 tablet, Rfl: 5 .  Multiple Vitamin (MULTIVITAMIN) tablet, Take 1 tablet by mouth daily.  , Disp: , Rfl:  .  omeprazole (PRILOSEC) 20 MG capsule, TAKE 1 CAPSULE BY MOUTH  DAILY, Disp: 90 capsule, Rfl: 1 .  Probiotic Product ([facility]S PROBIOTIC) CHEW, Chew by mouth daily., Disp: , Rfl:  .  Pyridoxine HCl (VITAMIN B-6) 250 MG tablet, Take 1 tablet (250 mg total) by mouth daily., Disp: 90 tablet, Rfl: 3 .  simvastatin (ZOCOR) 40 MG tablet, TAKE 1 TABLET BY MOUTH  DAILY AT 6 PM, Disp: 90 tablet, Rfl: 1 No current facility-administered medications for this visit.   Facility-Administered Medications Ordered in Other Visits:  .  darbepoetin (ARANESP) injection 300 mcg, 300 mcg, Subcutaneous, Once, [name], MD .  epoetin alfa (EPOGEN,PROCRIT) injection 40,000 Units, 40,000 Units, Subcutaneous, Once, [name], MD  Allergies: No Known Allergies  Past Medical History, Surgical history, Social history, and Family History were reviewed and updated.  Review of Systems: As above  Physical Exam:  weight is 170 lb 6.4 oz (77.3 kg). His oral temperature is 97.7 F (36.5 C). His blood pressure is 145/44 (abnormal) and  his pulse is 71. His respiration is 16 and oxygen saturation is 98%.   Wt Readings from Last 3 Encounters:  03/04/17 170 lb 6.4 oz (77.3 kg)  02/11/17 164 lb 1.3 oz (74.4 kg)  01/31/17 165 lb 6.4 oz (75 kg)     Elderly white gentleman in no obvious distress. Head and neck exam shows no ocular or oral lesions. There are no palpable cervical or supraclavicular lymph nodes. Lungs are clear. Cardiac exam regular rate and rhythm with no murmurs, rubs or bruits. Abdomen is soft. He has good bowel sounds. There is no fluid wave. There is no palpable liver or spleen tip. Back exam shows some kyphosis. Extremities shows some age-related osteoarthritic changes. He has decent range  of motion of his joints. Skin exam shows no rashes, ecchymoses or petechia.  Lab Results  Component Value Date   WBC 6.2 02/11/2017   HGB 8.1 (L) 02/11/2017   HCT 23.7 (L) 02/11/2017   MCV 101 (H) 02/11/2017   PLT 488 (H) 02/11/2017     Chemistry      Component Value Date/Time   NA 134 02/11/2017 1459   NA 131 (L) 07/30/2016 1039   K 4.1 02/11/2017 1459   K 4.4 07/30/2016 1039   CL 99 02/11/2017 1459   CO2 25 02/11/2017 1459   CO2 21 (L) 07/30/2016 1039   BUN 23 (H) 02/11/2017 1459   BUN 20.3 07/30/2016 1039   CREATININE 1.0 02/11/2017 1459   CREATININE 0.9 07/30/2016 1039      Component Value Date/Time   CALCIUM 8.6 02/11/2017 1459   CALCIUM 8.7 07/30/2016 1039   ALKPHOS 50 02/11/2017 1459   ALKPHOS 47 07/30/2016 1039   AST 24 02/11/2017 1459   AST 17 07/30/2016 1039   ALT 25 02/11/2017 1459   ALT 18 07/30/2016 1039   BILITOT 0.80 02/11/2017 1459   BILITOT 0.93 07/30/2016 1039         Impression and Plan: Alexander Rice is 83 year old gentleman with myelodysplasia. He has refractory anemia with ringed sideroblasts.   I will go ahead and give him Procrit today.  I do not think that he needs any blood. Once his hemoglobin gets below 7.5, then I will go ahead and transfuse him. I suspect this might happen within the next couple months.    I want to get him back to see him in another 3 weeks.  [name], MD 4/16/20184:12 PM

## 2017-03-05 LAB — IRON AND TIBC
%SAT: 66 % — AB (ref 20–55)
Iron: 133 ug/dL (ref 42–163)
TIBC: 201 ug/dL — ABNORMAL LOW (ref 202–409)
UIBC: 68 ug/dL — ABNORMAL LOW (ref 117–376)

## 2017-03-05 LAB — FERRITIN: Ferritin: 829 ng/ml — ABNORMAL HIGH (ref 22–316)

## 2017-03-07 ENCOUNTER — Other Ambulatory Visit: Payer: Self-pay | Admitting: Neurology

## 2017-03-21 ENCOUNTER — Ambulatory Visit: Payer: Medicare Other | Admitting: Podiatry

## 2017-03-25 ENCOUNTER — Ambulatory Visit (HOSPITAL_BASED_OUTPATIENT_CLINIC_OR_DEPARTMENT_OTHER): Payer: Medicare Other | Admitting: Family

## 2017-03-25 ENCOUNTER — Other Ambulatory Visit (HOSPITAL_BASED_OUTPATIENT_CLINIC_OR_DEPARTMENT_OTHER): Payer: Medicare Other

## 2017-03-25 ENCOUNTER — Ambulatory Visit (HOSPITAL_BASED_OUTPATIENT_CLINIC_OR_DEPARTMENT_OTHER): Payer: Medicare Other

## 2017-03-25 VITALS — BP 156/52 | HR 68 | Temp 97.7°F | Resp 18 | Wt 169.8 lb

## 2017-03-25 DIAGNOSIS — D461 Refractory anemia with ring sideroblasts: Secondary | ICD-10-CM | POA: Diagnosis not present

## 2017-03-25 DIAGNOSIS — M1990 Unspecified osteoarthritis, unspecified site: Secondary | ICD-10-CM | POA: Diagnosis not present

## 2017-03-25 DIAGNOSIS — D469 Myelodysplastic syndrome, unspecified: Secondary | ICD-10-CM

## 2017-03-25 DIAGNOSIS — D510 Vitamin B12 deficiency anemia due to intrinsic factor deficiency: Secondary | ICD-10-CM

## 2017-03-25 LAB — CBC WITH DIFFERENTIAL (CANCER CENTER ONLY)
BASO#: 0 10*3/uL (ref 0.0–0.2)
BASO%: 0.2 % (ref 0.0–2.0)
EOS%: 1.7 % (ref 0.0–7.0)
Eosinophils Absolute: 0.1 10*3/uL (ref 0.0–0.5)
HCT: 24.3 % — ABNORMAL LOW (ref 38.7–49.9)
HGB: 8.4 g/dL — ABNORMAL LOW (ref 13.0–17.1)
LYMPH#: 1.8 10*3/uL (ref 0.9–3.3)
LYMPH%: 29.8 % (ref 14.0–48.0)
MCH: 34.3 pg — ABNORMAL HIGH (ref 28.0–33.4)
MCHC: 34.6 g/dL (ref 32.0–35.9)
MCV: 99 fL — ABNORMAL HIGH (ref 82–98)
MONO#: 0.5 10*3/uL (ref 0.1–0.9)
MONO%: 7.8 % (ref 0.0–13.0)
NEUT%: 60.5 % (ref 40.0–80.0)
NEUTROS ABS: 3.7 10*3/uL (ref 1.5–6.5)
Platelets: 486 10*3/uL — ABNORMAL HIGH (ref 145–400)
RBC: 2.45 10*6/uL — AB (ref 4.20–5.70)
RDW: 21.1 % — AB (ref 11.1–15.7)
WBC: 6.1 10*3/uL (ref 4.0–10.0)

## 2017-03-25 MED ORDER — EPOETIN ALFA 40000 UNIT/ML IJ SOLN
INTRAMUSCULAR | Status: AC
  Filled 2017-03-25: qty 1

## 2017-03-25 MED ORDER — EPOETIN ALFA 40000 UNIT/ML IJ SOLN
40000.0000 [IU] | Freq: Once | INTRAMUSCULAR | Status: AC
  Administered 2017-03-25: 40000 [IU] via SUBCUTANEOUS

## 2017-03-25 NOTE — Patient Instructions (Signed)
Epoetin Alfa injection °What is this medicine? °EPOETIN ALFA (e POE e tin AL fa) helps your body make more red blood cells. This medicine is used to treat anemia caused by chronic kidney failure, cancer chemotherapy, or HIV-therapy. It may also be used before surgery if you have anemia. °This medicine may be used for other purposes; ask your health care provider or pharmacist if you have questions. °COMMON BRAND NAME(S): Epogen, Procrit °What should I tell my health care provider before I take this medicine? °They need to know if you have any of these conditions: °-blood clotting disorders °-cancer patient not on chemotherapy °-cystic fibrosis °-heart disease, such as angina or heart failure °-hemoglobin level of 12 g/dL or greater °-high blood pressure °-low levels of folate, iron, or vitamin B12 °-seizures °-an unusual or allergic reaction to erythropoietin, albumin, benzyl alcohol, hamster proteins, other medicines, foods, dyes, or preservatives °-pregnant or trying to get pregnant °-breast-feeding °How should I use this medicine? °This medicine is for injection into a vein or under the skin. It is usually given by a health care professional in a hospital or clinic setting. °If you get this medicine at home, you will be taught how to prepare and give this medicine. Use exactly as directed. Take your medicine at regular intervals. Do not take your medicine more often than directed. °It is important that you put your used needles and syringes in a special sharps container. Do not put them in a trash can. If you do not have a sharps container, call your pharmacist or healthcare provider to get one. °A special MedGuide will be given to you by the pharmacist with each prescription and refill. Be sure to read this information carefully each time. °Talk to your pediatrician regarding the use of this medicine in children. While this drug may be prescribed for selected conditions, precautions do apply. °Overdosage: If you  think you have taken too much of this medicine contact a poison control center or emergency room at once. °NOTE: This medicine is only for you. Do not share this medicine with others. °What if I miss a dose? °If you miss a dose, take it as soon as you can. If it is almost time for your next dose, take only that dose. Do not take double or extra doses. °What may interact with this medicine? °Do not take this medicine with any of the following medications: °-darbepoetin alfa °This list may not describe all possible interactions. Give your health care provider a list of all the medicines, herbs, non-prescription drugs, or dietary supplements you use. Also tell them if you smoke, drink alcohol, or use illegal drugs. Some items may interact with your medicine. °What should I watch for while using this medicine? °Your condition will be monitored carefully while you are receiving this medicine. °You may need blood work done while you are taking this medicine. °What side effects may I notice from receiving this medicine? °Side effects that you should report to your doctor or health care professional as soon as possible: °-allergic reactions like skin rash, itching or hives, swelling of the face, lips, or tongue °-breathing problems °-changes in vision °-chest pain °-confusion, trouble speaking or understanding °-feeling faint or lightheaded, falls °-high blood pressure °-muscle aches or pains °-pain, swelling, warmth in the leg °-rapid weight gain °-severe headaches °-sudden numbness or weakness of the face, arm or leg °-trouble walking, dizziness, loss of balance or coordination °-seizures (convulsions) °-swelling of the ankles, feet, hands °-unusually weak or tired °  Side effects that usually do not require medical attention (report to your doctor or health care professional if they continue or are bothersome): °-diarrhea °-fever, chills (flu-like symptoms) °-headaches °-nausea, vomiting °-redness, stinging, or swelling at  site where injected °This list may not describe all possible side effects. Call your doctor for medical advice about side effects. You may report side effects to FDA at 1-800-FDA-1088. °Where should I keep my medicine? °Keep out of the reach of children. °Store in a refrigerator between 2 and 8 degrees C (36 and 46 degrees F). Do not freeze or shake. Throw away any unused portion if using a single-dose vial. Multi-dose vials can be kept in the refrigerator for up to 21 days after the initial dose. Throw away unused medicine. °NOTE: This sheet is a summary. It may not cover all possible information. If you have questions about this medicine, talk to your doctor, pharmacist, or health care provider. °© 2018 [employer] (2016-06-25 19:42:31) ° °

## 2017-03-25 NOTE — Progress Notes (Signed)
Hematology and Oncology Follow Up Visit  FAITH BRANAN [id number] Aug 24, 1932 83 y.o. 03/25/2017   Principle Diagnosis:  Refractory anemia with ringed sideroblasts. Pernicious anemia Severe osteoarthritis  Current Therapy:   Procrit 40,000 units subcutaneous every 3 weeks Vitamin B6 250 mg by mouth daily Blood transfusions as indicated - last transfusion was on 07/31/2016   Interim History:  Mr. Lardizabal is here today with his sweet wife and daughter for follow-up. He continues to do well and is pleasantly confused. Some information was obtained from his family.  His Hgb is stable at 8.4 with an MCV of 99. He has had no episodes of bleeding, bruising or petechiae.  No lymphadenopathy found on exam.  He has had no issue with infection. No fever, chills, n/v, cough, rash, dizziness, chest pain, palpitations, abdominal pain or changes in bowel or bladder habits.  He has occasional SOB with over exertion. He will take time to rest as needed. He uses a rolling walker with seat. No falls or syncopal episodes.  No swelling, tenderness, numbness or tingling. No c/o pain at this time.  He has maintained a wonderful appetite and is staying well hydrated. His weight is stable.   ECOG Performance Status: 1 - Symptomatic but completely ambulatory  Medications:  Allergies as of 03/25/2017   No Known Allergies     Medication List       Accurate as of 03/25/17  3:16 PM. Always use your most recent med list.          aspirin 81 MG tablet Take 81 mg by mouth daily.   CVS OMEGA-3 KRILL OIL PO Take 350 mg by mouth daily. Reported on 01/16/2016   [facility]S PROBIOTIC Chew Chew by mouth daily.   cyanocobalamin 100 MCG tablet Take by mouth daily.   dorzolamide 2 % ophthalmic solution Commonly known as:  TRUSOPT Place 1 drop into both eyes 2 (two) times daily.   doxazosin 4 MG tablet Commonly known as:  CARDURA TAKE 1 TABLET BY MOUTH AT  BEDTIME   lisinopril 5 MG tablet Commonly known as:   PRINIVIL,ZESTRIL Take 1 tablet (5 mg total) by mouth daily.   LUMIGAN 0.01 % Soln Generic drug:  bimatoprost Place 1 drop into both eyes at bedtime.   memantine 10 MG tablet Commonly known as:  NAMENDA TAKE 1 TABLET BY MOUTH TWO  TIMES DAILY   multivitamin tablet Take 1 tablet by mouth daily.   omeprazole 20 MG capsule Commonly known as:  PRILOSEC TAKE 1 CAPSULE BY MOUTH  DAILY   simvastatin 40 MG tablet Commonly known as:  ZOCOR TAKE 1 TABLET BY MOUTH  DAILY AT 6 PM   vitamin B-6 250 MG tablet Take 1 tablet (250 mg total) by mouth daily.       Allergies: No Known Allergies  Past Medical History, Surgical history, Social history, and Family History were reviewed and updated.  Review of Systems: All other 10 point review of systems is negative.   Physical Exam:  vitals were not taken for this visit.  Wt Readings from Last 3 Encounters:  03/04/17 170 lb 6.4 oz (77.3 kg)  02/11/17 164 lb 1.3 oz (74.4 kg)  01/31/17 165 lb 6.4 oz (75 kg)    Ocular: Sclerae unicteric, pupils equal, round and reactive to light Ear-nose-throat: Oropharynx clear, dentition fair Lymphatic: No cervical, supraclavicular or axillary adenopathy Lungs no rales or rhonchi, good excursion bilaterally Heart regular rate and rhythm, no murmur appreciated Abd soft, nontender, positive bowel sounds,  no liver or spleen tip palpated on exam, no fluid wave MSK no focal spinal tenderness, no joint edema Neuro: non-focal, well-oriented, appropriate affect Breasts: Deferred  Lab Results  Component Value Date   WBC 6.1 03/25/2017   HGB 8.4 (L) 03/25/2017   HCT 24.3 (L) 03/25/2017   MCV 99 (H) 03/25/2017   PLT 486 (H) 03/25/2017   Lab Results  Component Value Date   FERRITIN 829 (H) 03/04/2017   IRON 133 03/04/2017   TIBC 201 (L) 03/04/2017   UIBC 68 (L) 03/04/2017   IRONPCTSAT 66 (H) 03/04/2017   Lab Results  Component Value Date   RETICCTPCT 1.0 11/25/2015   RBC 2.45 (L) 03/25/2017    RETICCTABS 27.2 11/25/2015   Lab Results  Component Value Date   KPAFRELGTCHN 3.09 (H) 04/22/2008   LAMBDASER 1.01 04/22/2008   KAPLAMBRATIO 3.06 (H) 04/22/2008   No results found for: Kandis Cocking, IGMSERUM Lab Results  Component Value Date   TOTALPROTELP 6.3 04/22/2008   ALBUMINELP 66.3 (H) 04/22/2008   A1GS 3.9 04/22/2008   A2GS 8.1 04/22/2008   BETS 5.2 04/22/2008   BETA2SER 4.0 04/22/2008   GAMS 12.5 04/22/2008   MSPIKE NOT DET 04/22/2008   SPEI * 04/22/2008     Chemistry      Component Value Date/Time   NA 130 (L) 03/04/2017 1516   NA 134 02/11/2017 1459   NA 131 (L) 07/30/2016 1039   K 4.9 03/04/2017 1516   K 4.1 02/11/2017 1459   K 4.4 07/30/2016 1039   CL 99 03/04/2017 1516   CL 99 02/11/2017 1459   CO2 23 03/04/2017 1516   CO2 25 02/11/2017 1459   CO2 21 (L) 07/30/2016 1039   BUN 27 03/04/2017 1516   BUN 23 (H) 02/11/2017 1459   BUN 20.3 07/30/2016 1039   CREATININE 1.13 03/04/2017 1516   CREATININE 1.0 02/11/2017 1459   CREATININE 0.9 07/30/2016 1039      Component Value Date/Time   CALCIUM 8.3 (L) 03/04/2017 1516   CALCIUM 8.6 02/11/2017 1459   CALCIUM 8.7 07/30/2016 1039   ALKPHOS 48 03/04/2017 1516   ALKPHOS 50 02/11/2017 1459   ALKPHOS 47 07/30/2016 1039   AST 14 03/04/2017 1516   AST 24 02/11/2017 1459   AST 17 07/30/2016 1039   ALT 17 03/04/2017 1516   ALT 25 02/11/2017 1459   ALT 18 07/30/2016 1039   BILITOT 0.6 03/04/2017 1516   BILITOT 0.80 02/11/2017 1459   BILITOT 0.93 07/30/2016 1039      Impression and Plan: Mr. Ewings is a pleasant 83 yo caucasian gentleman with myelodysplasia and refractory anemia with ringed sideroblasts. He is doing well and has no complaints at this time.  His Hgb is improved slightly at 8.4. He will get Procrit today as planned.  No transfusion needed at this time.  We will plan to see him back in another 3 weeks for follow-up and lab work.  His family will contact our office with any questions or  concerns. We can certainly see him sooner if need be.   Eliezer Bottom, NP 5/7/20183:16 PM

## 2017-03-26 ENCOUNTER — Other Ambulatory Visit: Payer: Self-pay | Admitting: Family Medicine

## 2017-03-26 LAB — RETICULOCYTES: Reticulocyte Count: 0.6 % (ref 0.6–2.6)

## 2017-03-26 LAB — FERRITIN: FERRITIN: 894 ng/mL — AB (ref 22–316)

## 2017-03-26 LAB — IRON AND TIBC
%SAT: 65 % — ABNORMAL HIGH (ref 20–55)
Iron: 136 ug/dL (ref 42–163)
TIBC: 211 ug/dL (ref 202–409)
UIBC: 75 ug/dL — AB (ref 117–376)

## 2017-04-12 ENCOUNTER — Ambulatory Visit (INDEPENDENT_AMBULATORY_CARE_PROVIDER_SITE_OTHER): Payer: Medicare Other | Admitting: Podiatry

## 2017-04-12 ENCOUNTER — Encounter: Payer: Self-pay | Admitting: Podiatry

## 2017-04-12 DIAGNOSIS — B351 Tinea unguium: Secondary | ICD-10-CM

## 2017-04-12 DIAGNOSIS — M79676 Pain in unspecified toe(s): Secondary | ICD-10-CM | POA: Diagnosis not present

## 2017-04-12 DIAGNOSIS — M79675 Pain in left toe(s): Principal | ICD-10-CM

## 2017-04-12 NOTE — Progress Notes (Signed)
Patient ID: Alexander Rice, male   DOB: 01/22/1932, 84 y.o.   MRN: [id number] HPI  Complaint:  Visit Type: Patient returns to my office for continued preventative foot care services. Complaint: Patient states" my nails have grown long and thick and become painful to walk and wear shoes. He presents for preventative foot care services. No changes to ROS  Podiatric Exam: Vascular: dorsalis pedis and posterior tibial pulses are negative. Capillary return is diminished.. Temperature gradient is negative. Skin turgor WNL,   Sensorium: Normal Semmes Weinstein monofilament test. Normal tactile sensation bilaterally.  Nail Exam: Pt has thick disfigured discolored nails with subungual debris noted bilateral entire nail hallux  Ulcer Exam: There is no evidence of ulcer or pre-ulcerative changes or infection. Orthopedic Exam: Muscle tone and strength are WNL. No limitations in general ROM. No crepitus or effusions noted. Foot type and digits show no abnormalities. Bony prominences are unremarkable. Skin: No Porokeratosis. No infection or ulcers  Diagnosis:  Tinea unguium, Pain in right toe, pain in left toes  Treatment & Plan Procedures and Treatment: Consent by patient was obtained for treatment procedures. The patient understood the discussion of treatment and procedures well. All questions were answered thoroughly reviewed. Debridement of mycotic and hypertrophic toenails, 1 through 5 bilateral and clearing of subungual debris. No ulceration, no infection noted.  Return Visit-Office Procedure: Patient instructed to return to the office for a follow up visit 3 months for continued evaluation and treatment.   Zurisadai Helminiak DPM  

## 2017-04-16 ENCOUNTER — Ambulatory Visit (HOSPITAL_BASED_OUTPATIENT_CLINIC_OR_DEPARTMENT_OTHER): Payer: Medicare Other

## 2017-04-16 ENCOUNTER — Other Ambulatory Visit (HOSPITAL_BASED_OUTPATIENT_CLINIC_OR_DEPARTMENT_OTHER): Payer: Medicare Other

## 2017-04-16 ENCOUNTER — Ambulatory Visit (HOSPITAL_BASED_OUTPATIENT_CLINIC_OR_DEPARTMENT_OTHER): Payer: Medicare Other | Admitting: Hematology & Oncology

## 2017-04-16 VITALS — BP 177/57 | HR 82 | Temp 98.2°F | Resp 16 | Wt 168.8 lb

## 2017-04-16 DIAGNOSIS — D469 Myelodysplastic syndrome, unspecified: Secondary | ICD-10-CM

## 2017-04-16 DIAGNOSIS — D461 Refractory anemia with ring sideroblasts: Secondary | ICD-10-CM | POA: Diagnosis not present

## 2017-04-16 DIAGNOSIS — D510 Vitamin B12 deficiency anemia due to intrinsic factor deficiency: Secondary | ICD-10-CM | POA: Diagnosis not present

## 2017-04-16 DIAGNOSIS — M1990 Unspecified osteoarthritis, unspecified site: Secondary | ICD-10-CM

## 2017-04-16 LAB — CBC WITH DIFFERENTIAL (CANCER CENTER ONLY)
BASO#: 0 10*3/uL (ref 0.0–0.2)
BASO%: 0.2 % (ref 0.0–2.0)
EOS%: 1.7 % (ref 0.0–7.0)
Eosinophils Absolute: 0.1 10*3/uL (ref 0.0–0.5)
HCT: 24.5 % — ABNORMAL LOW (ref 38.7–49.9)
HGB: 8.4 g/dL — ABNORMAL LOW (ref 13.0–17.1)
LYMPH#: 1.5 10*3/uL (ref 0.9–3.3)
LYMPH%: 25.8 % (ref 14.0–48.0)
MCH: 34.1 pg — ABNORMAL HIGH (ref 28.0–33.4)
MCHC: 34.3 g/dL (ref 32.0–35.9)
MCV: 100 fL — ABNORMAL HIGH (ref 82–98)
MONO#: 0.4 10*3/uL (ref 0.1–0.9)
MONO%: 6.5 % (ref 0.0–13.0)
NEUT#: 3.8 10*3/uL (ref 1.5–6.5)
NEUT%: 65.8 % (ref 40.0–80.0)
Platelets: 505 10*3/uL — ABNORMAL HIGH (ref 145–400)
RBC: 2.46 10*6/uL — ABNORMAL LOW (ref 4.20–5.70)
RDW: 20.6 % — ABNORMAL HIGH (ref 11.1–15.7)
WBC: 5.8 10*3/uL (ref 4.0–10.0)

## 2017-04-16 LAB — IRON AND TIBC
%SAT: 61 % — ABNORMAL HIGH (ref 20–55)
IRON: 125 ug/dL (ref 42–163)
TIBC: 205 ug/dL (ref 202–409)
UIBC: 80 ug/dL — ABNORMAL LOW (ref 117–376)

## 2017-04-16 LAB — FERRITIN: FERRITIN: 938 ng/mL — AB (ref 22–316)

## 2017-04-16 MED ORDER — EPOETIN ALFA 40000 UNIT/ML IJ SOLN
40000.0000 [IU] | Freq: Once | INTRAMUSCULAR | Status: AC
  Administered 2017-04-16: 40000 [IU] via SUBCUTANEOUS

## 2017-04-16 MED ORDER — EPOETIN ALFA 40000 UNIT/ML IJ SOLN
INTRAMUSCULAR | Status: AC
  Filled 2017-04-16: qty 1

## 2017-04-16 NOTE — Patient Instructions (Signed)
Epoetin Alfa injection °What is this medicine? °EPOETIN ALFA (e POE e tin AL fa) helps your body make more red blood cells. This medicine is used to treat anemia caused by chronic kidney failure, cancer chemotherapy, or HIV-therapy. It may also be used before surgery if you have anemia. °This medicine may be used for other purposes; ask your health care provider or pharmacist if you have questions. °COMMON BRAND NAME(S): Epogen, Procrit °What should I tell my health care provider before I take this medicine? °They need to know if you have any of these conditions: °-blood clotting disorders °-cancer patient not on chemotherapy °-cystic fibrosis °-heart disease, such as angina or heart failure °-hemoglobin level of 12 g/dL or greater °-high blood pressure °-low levels of folate, iron, or vitamin B12 °-seizures °-an unusual or allergic reaction to erythropoietin, albumin, benzyl alcohol, hamster proteins, other medicines, foods, dyes, or preservatives °-pregnant or trying to get pregnant °-breast-feeding °How should I use this medicine? °This medicine is for injection into a vein or under the skin. It is usually given by a health care professional in a hospital or clinic setting. °If you get this medicine at home, you will be taught how to prepare and give this medicine. Use exactly as directed. Take your medicine at regular intervals. Do not take your medicine more often than directed. °It is important that you put your used needles and syringes in a special sharps container. Do not put them in a trash can. If you do not have a sharps container, call your pharmacist or healthcare provider to get one. °A special MedGuide will be given to you by the pharmacist with each prescription and refill. Be sure to read this information carefully each time. °Talk to your pediatrician regarding the use of this medicine in children. While this drug may be prescribed for selected conditions, precautions do apply. °Overdosage: If you  think you have taken too much of this medicine contact a poison control center or emergency room at once. °NOTE: This medicine is only for you. Do not share this medicine with others. °What if I miss a dose? °If you miss a dose, take it as soon as you can. If it is almost time for your next dose, take only that dose. Do not take double or extra doses. °What may interact with this medicine? °Do not take this medicine with any of the following medications: °-darbepoetin alfa °This list may not describe all possible interactions. Give your health care provider a list of all the medicines, herbs, non-prescription drugs, or dietary supplements you use. Also tell them if you smoke, drink alcohol, or use illegal drugs. Some items may interact with your medicine. °What should I watch for while using this medicine? °Your condition will be monitored carefully while you are receiving this medicine. °You may need blood work done while you are taking this medicine. °What side effects may I notice from receiving this medicine? °Side effects that you should report to your doctor or health care professional as soon as possible: °-allergic reactions like skin rash, itching or hives, swelling of the face, lips, or tongue °-breathing problems °-changes in vision °-chest pain °-confusion, trouble speaking or understanding °-feeling faint or lightheaded, falls °-high blood pressure °-muscle aches or pains °-pain, swelling, warmth in the leg °-rapid weight gain °-severe headaches °-sudden numbness or weakness of the face, arm or leg °-trouble walking, dizziness, loss of balance or coordination °-seizures (convulsions) °-swelling of the ankles, feet, hands °-unusually weak or tired °  Side effects that usually do not require medical attention (report to your doctor or health care professional if they continue or are bothersome): °-diarrhea °-fever, chills (flu-like symptoms) °-headaches °-nausea, vomiting °-redness, stinging, or swelling at  site where injected °This list may not describe all possible side effects. Call your doctor for medical advice about side effects. You may report side effects to FDA at 1-800-FDA-1088. °Where should I keep my medicine? °Keep out of the reach of children. °Store in a refrigerator between 2 and 8 degrees C (36 and 46 degrees F). Do not freeze or shake. Throw away any unused portion if using a single-dose vial. Multi-dose vials can be kept in the refrigerator for up to 21 days after the initial dose. Throw away unused medicine. °NOTE: This sheet is a summary. It may not cover all possible information. If you have questions about this medicine, talk to your doctor, pharmacist, or health care provider. °© 2018 [employer] (2016-06-25 19:42:31) ° °

## 2017-04-16 NOTE — Progress Notes (Signed)
Her blood level available to the level of As well as bone or  Hematology and Oncology Follow Up Visit  Alexander Rice [id number] 1932-08-04 83 y.o. 04/16/2017   Principle Diagnosis:   Refractory anemia with ringed sideroblasts.  Pernicious anemia  Severe osteoarthritis  Current Therapy:    Procrit 40,000 units subcutaneous every 3 weeks  Vitamin B6 250 mg by mouth daily  Vitamin B-12 1 mg IM every month-done at home  Blood transfusions as indicated-last transfusion on 07/31/2016     Interim History:  [name] is back for follow-up.  his dementia continues to be his biggest problem.  It sounds like in a nice Memorial Day weekend. He really cannot remember what he did.  Back in early May, his iron studies looked all right. His ferritin was 894. His iron saturation was 65%.  He's had no fever. He's had no change in bowel or bladder habits. There's been no incontinence.  He has had no cough. He's had no rash.   His family really is doing a good job with him.  Overall, his performance status is ECOG 3.  Medications:  Current Outpatient Prescriptions:  .  aspirin 81 MG tablet, Take 81 mg by mouth daily.  , Disp: , Rfl:  .  [facility] OMEGA-3 KRILL OIL PO, Take 350 mg by mouth daily. Reported on 01/16/2016, Disp: , Rfl:  .  cyanocobalamin 100 MCG tablet, Take by mouth daily., Disp: , Rfl:  .  dorzolamide (TRUSOPT) 2 % ophthalmic solution, Place 1 drop into both eyes 2 (two) times daily.  , Disp: , Rfl:  .  doxazosin (CARDURA) 4 MG tablet, TAKE 1 TABLET BY MOUTH AT  BEDTIME, Disp: 90 tablet, Rfl: 3 .  lisinopril (PRINIVIL,ZESTRIL) 5 MG tablet, Take 1 tablet (5 mg total) by mouth daily., Disp: 90 tablet, Rfl: 3 .  LUMIGAN 0.01 % SOLN, Place 1 drop into both eyes at bedtime. , Disp: , Rfl:  .  memantine (NAMENDA) 10 MG tablet, TAKE 1 TABLET BY MOUTH TWO  TIMES DAILY, Disp: 180 tablet, Rfl: 0 .  Multiple Vitamin (MULTIVITAMIN) tablet, Take 1 tablet by mouth daily.  , Disp: ,  Rfl:  .  omeprazole (PRILOSEC) 20 MG capsule, TAKE 1 CAPSULE BY MOUTH  DAILY, Disp: 90 capsule, Rfl: 3 .  Probiotic Product (CVS PROBIOTIC) CHEW, Chew by mouth daily., Disp: , Rfl:  .  Pyridoxine HCl (VITAMIN B-6) 250 MG tablet, Take 1 tablet (250 mg total) by mouth daily., Disp: 90 tablet, Rfl: 3 .  simvastatin (ZOCOR) 40 MG tablet, TAKE 1 TABLET BY MOUTH  DAILY AT 6 PM, Disp: 90 tablet, Rfl: 3 No current facility-administered medications for this visit.   Facility-Administered Medications Ordered in Other Visits:  .  darbepoetin (ARANESP) injection 300 mcg, 300 mcg, Subcutaneous, Once, Ennever, Rudell Cobb, MD  Allergies: No Known Allergies  Past Medical History, Surgical history, Social history, and Family History were reviewed and updated.  Review of Systems: As above  Physical Exam:  weight is 168 lb 12.8 oz (76.6 kg). His oral temperature is 98.2 F (36.8 C). His blood pressure is 177/57 (abnormal) and his pulse is 82. His respiration is 16 and oxygen saturation is 100%.   Wt Readings from Last 3 Encounters:  04/16/17 168 lb 12.8 oz (76.6 kg)  03/25/17 169 lb 12.8 oz (77 kg)  03/04/17 170 lb 6.4 oz (77.3 kg)     Elderly white gentleman in no obvious distress. Head and neck exam shows no  ocular or oral lesions. There are no palpable cervical or supraclavicular lymph nodes. Lungs are clear. Cardiac exam regular rate and rhythm with no murmurs, rubs or bruits. Abdomen is soft. He has good bowel sounds. There is no fluid wave. There is no palpable liver or spleen tip. Back exam shows some kyphosis. Extremities shows some age-related osteoarthritic changes. He has decent range of motion of his joints. Skin exam shows no rashes, ecchymoses or petechia.  Lab Results  Component Value Date   WBC 5.8 04/16/2017   HGB 8.4 (L) 04/16/2017   HCT 24.5 (L) 04/16/2017   MCV 100 (H) 04/16/2017   PLT 505 (H) 04/16/2017     Chemistry      Component Value Date/Time   NA 130 (L) 03/04/2017 1516     NA 134 02/11/2017 1459   NA 131 (L) 07/30/2016 1039   K 4.9 03/04/2017 1516   K 4.1 02/11/2017 1459   K 4.4 07/30/2016 1039   CL 99 03/04/2017 1516   CL 99 02/11/2017 1459   CO2 23 03/04/2017 1516   CO2 25 02/11/2017 1459   CO2 21 (L) 07/30/2016 1039   BUN 27 03/04/2017 1516   BUN 23 (H) 02/11/2017 1459   BUN 20.3 07/30/2016 1039   CREATININE 1.13 03/04/2017 1516   CREATININE 1.0 02/11/2017 1459   CREATININE 0.9 07/30/2016 1039      Component Value Date/Time   CALCIUM 8.3 (L) 03/04/2017 1516   CALCIUM 8.6 02/11/2017 1459   CALCIUM 8.7 07/30/2016 1039   ALKPHOS 48 03/04/2017 1516   ALKPHOS 50 02/11/2017 1459   ALKPHOS 47 07/30/2016 1039   AST 14 03/04/2017 1516   AST 24 02/11/2017 1459   AST 17 07/30/2016 1039   ALT 17 03/04/2017 1516   ALT 25 02/11/2017 1459   ALT 18 07/30/2016 1039   BILITOT 0.6 03/04/2017 1516   BILITOT 0.80 02/11/2017 1459   BILITOT 0.93 07/30/2016 1039         Impression and Plan: [name] is 83 year old gentleman with myelodysplasia. He has refractory anemia with ringed sideroblasts.   I will go ahead and give him Procrit today.  We will plan to get him back in another 3 weeks. At least, his hemoglobin is holding well to the steady and I'm happy about that.   I know that his dementia will be the ultimate demise for him. I can tell that he worsens as we see him over the months.   Alexander Napoleon, MD 5/29/20181:03 PM

## 2017-04-17 LAB — RETICULOCYTES: Reticulocyte Count: 0.6 % (ref 0.6–2.6)

## 2017-04-19 ENCOUNTER — Encounter: Payer: Self-pay | Admitting: Family Medicine

## 2017-04-19 ENCOUNTER — Ambulatory Visit (INDEPENDENT_AMBULATORY_CARE_PROVIDER_SITE_OTHER): Payer: Medicare Other | Admitting: Family Medicine

## 2017-04-19 DIAGNOSIS — I25119 Atherosclerotic heart disease of native coronary artery with unspecified angina pectoris: Secondary | ICD-10-CM

## 2017-04-19 DIAGNOSIS — D518 Other vitamin B12 deficiency anemias: Secondary | ICD-10-CM

## 2017-04-19 DIAGNOSIS — E785 Hyperlipidemia, unspecified: Secondary | ICD-10-CM | POA: Diagnosis not present

## 2017-04-19 DIAGNOSIS — R350 Frequency of micturition: Secondary | ICD-10-CM | POA: Diagnosis not present

## 2017-04-19 DIAGNOSIS — I10 Essential (primary) hypertension: Secondary | ICD-10-CM | POA: Diagnosis not present

## 2017-04-19 DIAGNOSIS — F0390 Unspecified dementia without behavioral disturbance: Secondary | ICD-10-CM | POA: Diagnosis not present

## 2017-04-19 DIAGNOSIS — I2510 Atherosclerotic heart disease of native coronary artery without angina pectoris: Secondary | ICD-10-CM

## 2017-04-19 DIAGNOSIS — N401 Enlarged prostate with lower urinary tract symptoms: Secondary | ICD-10-CM

## 2017-04-19 DIAGNOSIS — F03B0 Unspecified dementia, moderate, without behavioral disturbance, psychotic disturbance, mood disturbance, and anxiety: Secondary | ICD-10-CM

## 2017-04-19 LAB — LDL CHOLESTEROL, DIRECT: Direct LDL: 29 mg/dL

## 2017-04-19 LAB — VITAMIN B12

## 2017-04-19 NOTE — Assessment & Plan Note (Signed)
S: Injections q30 days in past now oral MV, likely some from chronic renal failure and MDS as well A/P: continue oral repletion- update b12 today

## 2017-04-19 NOTE — Assessment & Plan Note (Signed)
On namenda- has follow up on Monday with Dr. Delice Lesch.

## 2017-04-19 NOTE — Assessment & Plan Note (Signed)
S: controlled on cardura 4mg  and lisinopril 5mg .  ASCVD 10 year risk calculation if age 81-79: already on statin BP Readings from Last 3 Encounters:  04/19/17 132/62  04/16/17 (!) 177/57  03/25/17 (!) 156/52  A/P: We discussed blood pressure goal of <150/90. Continue current medications. Would consider stopping carura but with prior elevations think we should keep regimen for now.

## 2017-04-19 NOTE — Assessment & Plan Note (Signed)
S: continued issues with incontinence- Drue Stager is full each AM. Has seen urology and off mybetriq. cardura is continued in hopes of avoiding retention.  A/P: continue current meds. No longer seeing urology. If had more orthostatic issues in future (had to be reduced to 4mg  from 8mg  cardura) then could consider stopping this.

## 2017-04-19 NOTE — Assessment & Plan Note (Signed)
No chest pain or shortness of breath- compliant with aspirin and statin.   Discussed refill nitroglycerin with daughter- she states if he had chest pain or shortness of breath she would prefer to have him evaluated- declines nitroglycerin refill.

## 2017-04-19 NOTE — Assessment & Plan Note (Signed)
S: well controlled on simvastatin 40mg . No myalgias.  Lab Results  Component Value Date   CHOL 117 04/18/2016   HDL 58.80 04/18/2016   LDLCALC 49 04/18/2016   TRIG 46.0 04/18/2016   CHOLHDL 2 04/18/2016   A/P: update direct LDL today- would be tough for him to come in fasting- really wants to eat in AM and we want to focus on quality of life for him

## 2017-04-19 NOTE — Patient Instructions (Addendum)
Please stop by lab before you go  No changes in medicine today  3-6 month follow up.   After visit with wife lets decide if we want to do annual wellness visit for one of you today

## 2017-04-19 NOTE — Progress Notes (Signed)
Subjective:  Alexander Rice is a 83 y.o. year old very pleasant male patient who presents for/with See problem oriented charting ROS- memory loss noted stable. No chest pain or shortness of breath. No headache or blurry vision. Has some hearing loss   Past Medical History-  Patient Active Problem List   Diagnosis Date Noted  . Bradycardia 04/27/2016    Priority: High  . Unresponsiveness 04/27/2016    Priority: High  . Moderate dementia, without behavioral disturbance 03/31/2015    Priority: High  . MDS (myelodysplastic syndrome) (Alexander Rice) 05/14/2011    Priority: High  . CAD (coronary artery disease) 09/29/2006    Priority: High  . Hyperlipidemia 09/29/2006    Priority: Medium  . Essential hypertension 09/29/2006    Priority: Medium  . BPH (benign prostatic hyperplasia) 09/29/2006    Priority: Medium  . Spinal stenosis of lumbar region 03/31/2015    Priority: Low  . Pernicious anemia 03/11/2015    Priority: Low  . Glaucoma 12/01/2014    Priority: Low  . Pulmonary nodule 04/08/2012    Priority: Low  . ANEMIA, B12 DEFICIENCY 07/03/2007    Priority: Low  . GERD 09/29/2006    Priority: Low    Medications- reviewed and updated Current Outpatient Prescriptions  Medication Sig Dispense Refill  . aspirin 81 MG tablet Take 81 mg by mouth daily.      . [facility]S OMEGA-3 KRILL OIL PO Take 350 mg by mouth daily. Reported on 01/16/2016    . cyanocobalamin 100 MCG tablet Take by mouth daily.    . dorzolamide (TRUSOPT) 2 % ophthalmic solution Place 1 drop into both eyes 2 (two) times daily.      [name] doxazosin (CARDURA) 4 MG tablet TAKE 1 TABLET BY MOUTH AT  BEDTIME 90 tablet 3  . lisinopril (PRINIVIL,ZESTRIL) 5 MG tablet Take 1 tablet (5 mg total) by mouth daily. 90 tablet 3  . LUMIGAN 0.01 % SOLN Place 1 drop into both eyes at bedtime.     . memantine (NAMENDA) 10 MG tablet TAKE 1 TABLET BY MOUTH TWO  TIMES DAILY 180 tablet 0  . Multiple Vitamin (MULTIVITAMIN) tablet Take 1 tablet by mouth  daily.      [name] omeprazole (PRILOSEC) 20 MG capsule TAKE 1 CAPSULE BY MOUTH  DAILY 90 capsule 3  . Probiotic Product (CVS PROBIOTIC) CHEW Chew by mouth daily.    . Pyridoxine HCl (VITAMIN B-6) 250 MG tablet Take 1 tablet (250 mg total) by mouth daily. 90 tablet 3  . simvastatin (ZOCOR) 40 MG tablet TAKE 1 TABLET BY MOUTH  DAILY AT 6 PM 90 tablet 3   No current facility-administered medications for this visit.    Facility-Administered Medications Ordered in Other Visits  Medication Dose Route Frequency Provider Last Rate Last Dose  . darbepoetin (ARANESP) injection 300 mcg  300 mcg Subcutaneous Once Alexander Napoleon, MD        Objective: BP 132/62   Pulse 77   Temp 97.9 F (36.6 C) (Oral)   Ht 5\' 8"  (1.727 m)   Wt 166 lb 6.4 oz (75.5 kg)   SpO2 96%   BMI 25.30 kg/m  Gen: NAD, resting comfortably TM not obscurred- curetted some wax. Hard of hearing Oropharynx normal- some plaque on teeth- encouraged regular brushing CV: RRR no murmurs rubs or gallops Lungs: CTAB no crackles, wheeze, rhonchi Abdomen: soft/nontender/nondistended/normal bowel sounds. No rebound or guarding. Normal weight Ext: no edema Skin: warm, dry Neuro: very pleasant- confused at times. Walks  with walker   Assessment/Plan:  ANEMIA, B12 DEFICIENCY S: Injections q30 days in past now oral MV, likely some from chronic renal failure and MDS as well A/P: continue oral repletion- update b12 today   Hyperlipidemia S: well controlled on simvastatin 40mg . No myalgias.  Lab Results  Component Value Date   CHOL 117 04/18/2016   HDL 58.80 04/18/2016   LDLCALC 49 04/18/2016   TRIG 46.0 04/18/2016   CHOLHDL 2 04/18/2016   A/P: update direct LDL today- would be tough for him to come in fasting- really wants to eat in AM and we want to focus on quality of life for him   BPH (benign prostatic hyperplasia) S: continued issues with incontinence- depedz is full each AM. Has seen urology and off mybetriq. cardura is  continued in hopes of avoiding retention.  A/P: continue current meds. No longer seeing urology. If had more orthostatic issues in future (had to be reduced to 4mg  from 8mg  cardura) then could consider stopping this.    Essential hypertension S: controlled on cardura 4mg  and lisinopril 5mg .  ASCVD 10 year risk calculation if age 50-79: already on statin BP Readings from Last 3 Encounters:  04/19/17 132/62  04/16/17 (!) 177/57  03/25/17 (!) 156/52  A/P: We discussed blood pressure goal of <150/90. Continue current medications. Would consider stopping carura but with prior elevations think we should keep regimen for now.    Moderate dementia, without behavioral disturbance On namenda- has follow up on Monday with Dr. Delice Rice.   CAD (coronary artery disease) No chest pain or shortness of breath- compliant with aspirin and statin.   Discussed refill nitroglycerin with daughter- she states if he had chest pain or shortness of breath she would prefer to have him evaluated- declines nitroglycerin refill.   3-6 month follow up  Orders Placed This Encounter  Procedures  . LDL cholesterol, direct    [place]  . Vitamin B12   Return precautions advised.  Alexander Reddish, MD

## 2017-04-22 ENCOUNTER — Encounter: Payer: Self-pay | Admitting: Neurology

## 2017-04-22 ENCOUNTER — Ambulatory Visit (INDEPENDENT_AMBULATORY_CARE_PROVIDER_SITE_OTHER): Payer: Medicare Other | Admitting: Neurology

## 2017-04-22 VITALS — BP 140/62 | HR 69 | Ht 65.0 in | Wt 168.0 lb

## 2017-04-22 DIAGNOSIS — F0390 Unspecified dementia without behavioral disturbance: Secondary | ICD-10-CM | POA: Diagnosis not present

## 2017-04-22 DIAGNOSIS — G20 Parkinson's disease: Secondary | ICD-10-CM | POA: Diagnosis not present

## 2017-04-22 DIAGNOSIS — I25119 Atherosclerotic heart disease of native coronary artery with unspecified angina pectoris: Secondary | ICD-10-CM

## 2017-04-22 DIAGNOSIS — F03B0 Unspecified dementia, moderate, without behavioral disturbance, psychotic disturbance, mood disturbance, and anxiety: Secondary | ICD-10-CM

## 2017-04-22 MED ORDER — CARBIDOPA-LEVODOPA 25-100 MG PO TABS: ORAL_TABLET | ORAL | 3 refills | Status: DC

## 2017-04-22 MED ORDER — MEMANTINE HCL 10 MG PO TABS: 10.0000 mg | ORAL_TABLET | Freq: Two times a day (BID) | ORAL | 3 refills | Status: DC

## 2017-04-22 NOTE — Patient Instructions (Signed)
1. Start Sinemet 25/100mg : Take 1/2 tablet three times a day 30 minutes before meals 2. Continue Namenda 10mg  twice a day 3. Follow-up in 6 months, call for any changes

## 2017-04-22 NOTE — Progress Notes (Signed)
NEUROLOGY FOLLOW UP OFFICE NOTE  Alexander Rice [id number]  HISTORY OF PRESENT ILLNESS: I had the pleasure of seeing Alexander Rice in follow-up in the neurology clinic on 04/23/2017. He is again accompanied by his wife and daughter who helps supplement the history today. The patient was last seen 8 months ago for worsening memory. MMSE in October 2017 was 17/30 (18/30 in January 2017). He had an unresponsive episode with bradycardia in June 2017 and Aricept was discontinued. He continues on Namenda 10mg  BID without side effects. He lives with his wife, he feels his memory is pretty good. His family continues to notice memory changes, but are mostly concerned about his stiffness and behavioral changes. They deny any hallucinations, but he is talking to the TV or to himself all the time. He would be yelling at the TV to get off there because it is time for his favorite show Wheel of [employer], which would not be until later in the day. He occasionally becomes more irritable to his wife, asking why he is eating when he is not supposed to be eating. He is always expecting a big check to come in the mail. He says random things. His daughter expressed concern about Parkinson's disease, she notices his hand shakes sometimes, and he can be very stiff and rigid in the morning, his wife has a lot of difficulty getting him out of bed. He holds his hands locked together all day long. His wife if unsure if he sleeps at night, he falls asleep quickly during the daytime. She does not think he gets out of bed because she does not hear anything, there was only one time he got up and threw his underwear away. No falls. He denies any headaches, dizziness, diplopia, dysarthria/dysphagia, neck pain, focal numbness/tingling/weakness.   HPI 03/11/15 : This is a pleasant 83 yo RH man with a history of hypertension, hyperlipidemia, low-grade myelodysplasia, refractory anemia, pernicious anemia, with worsening memory loss. He  feels that his memory is pretty good. He states he does a lot of work for the [employer] and denies having any difficulties with his duties there. He does note that he misplaces things and has had word-finding difficulties. He got lost driving 6 months ago but would not elaborate. He denied any missed bills or missed medications. His wife and daughter, on the other hand, report more concerning memory changes over the past couple of years. He would start in the middle of a conversation and she would have to ask what he is talking about. He has noticeable word-finding difficulties, and sometimes does not seem to understand conversations. His wife is concerned about his driving, they got lost 6 months ago and were out of 3 hours. He sometimes drives/drifts to the median, or sometimes comes to a complete stop. He would get upset with her if she comments. He frequently misplaces things, his cell phone, credit card, one time he did not remember where he parked, and they reported the car stolen until he came back the next day and found the car. He repeats himself. He has had some problems with late bill payments. They deny any personality changes. No family history of memory loss, no significant head injuries. He drinks wine daily and beer occasionally, reporting that this is less than before.   His family reported worsening memory, he got agitated why his family had not visited, when they in fact did. His family was there when he was loudly calling his wife's name  at midnight and kept saying "lights." He showed her that he was talking about the TV, which she fixed for him. One time at 11pm his wife heard the garage door open. She saw him coming back in, he said he was going to get the paper but saw it was raining hard and went back inside. She then woke up at 6am to find him dressed in front of the TV. He is able to dress and bathe independently, but his wife picks out his clothes, otherwise he would wear  inappropriate clothing such as shorts in the winter. They have to remind him to shower, otherwise he would not think to do it. His wife has been administering his medications for the past 2-3 weeks after he had fallen in his room and they found that he had taken 2 days worth of medication at the same time. His room was in disarray and he had urinated on himself.   PAST MEDICAL HISTORY: Past Medical History:  Diagnosis Date  . Adenomatous colon polyp 02/1996, 02/2011   TA polyp 02/2011  . Anemia in chronic renal disease 11/02/2011  . B12 deficiency   . BPH (benign prostatic hyperplasia)   . CAD (coronary artery disease)   . CVD (cardiovascular disease)   . Diverticulosis   . Esophageal stricture   . GERD (gastroesophageal reflux disease)   . Glaucoma   . Heart palpitations   . Hemorrhoids   . Hiatal hernia   . History of colonic polyps 09/29/2006   Polyps age 73, Dr. Fuller Plan stated no further colonoscopy due to age. Confirmed with daughter given alzheimers, CAD history    . Hyperlipidemia   . Hypertension   . MDS (myelodysplastic syndrome) [facility])     MEDICATIONS:  Outpatient Encounter Prescriptions as of 04/22/2017  Medication Sig Note  . aspirin 81 MG tablet Take 81 mg by mouth daily.     . CVS OMEGA-3 KRILL OIL PO Take 350 mg by mouth daily. Reported on 01/16/2016   . cyanocobalamin 100 MCG tablet Take by mouth daily. 04/26/2016: Daughter is unaware of the dosage.   . dorzolamide (TRUSOPT) 2 % ophthalmic solution Place 1 drop into both eyes 2 (two) times daily.     [name] doxazosin (CARDURA) 4 MG tablet TAKE 1 TABLET BY MOUTH AT  BEDTIME   . lisinopril (PRINIVIL,ZESTRIL) 5 MG tablet Take 1 tablet (5 mg total) by mouth daily.   [name] LUMIGAN 0.01 % SOLN Place 1 drop into both eyes at bedtime.    . memantine (NAMENDA) 10 MG tablet TAKE 1 TABLET BY MOUTH TWO  TIMES DAILY   . Multiple Vitamin (MULTIVITAMIN) tablet Take 1 tablet by mouth daily.     [name] omeprazole (PRILOSEC) 20 MG capsule TAKE 1 CAPSULE  BY MOUTH  DAILY   . Probiotic Product (CVS PROBIOTIC) CHEW Chew by mouth daily.   . Pyridoxine HCl (VITAMIN B-6) 250 MG tablet Take 1 tablet (250 mg total) by mouth daily.   . simvastatin (ZOCOR) 40 MG tablet TAKE 1 TABLET BY MOUTH  DAILY AT 6 PM    Facility-Administered Encounter Medications as of 04/22/2017  Medication  . darbepoetin (ARANESP) injection 300 mcg     ALLERGIES: No Known Allergies  FAMILY HISTORY: Family History  Problem Relation Age of Onset  . COPD Mother   . Alcohol abuse Father     SOCIAL HISTORY: Social History   Social History  . Marital status: Married    Spouse name: N/A  . Number  of children: 3  . Years of education: N/A   Occupational History  . Retired Retired   Social History Main Topics  . Smoking status: Former Smoker    Quit date: 11/19/1968  . Smokeless tobacco: Never Used     Comment: never used tobacco  . Alcohol use 4.2 oz/week    7 Standard drinks or equivalent per week     Comment: Wine/Beer  . Drug use: No  . Sexual activity: Not on file   Other Topics Concern  . Not on file   Social History Narrative   Married (wife patient of Dr. Yong Channel as well)      Retired from [employer] logistics/support/administrative officer and [place]      Hobbies: Scientist, research (physical sciences) [employer], [employer] .    REVIEW OF SYSTEMS: Constitutional: No fevers, chills, or sweats, no generalized fatigue, change in appetite Eyes: No visual changes, double vision, eye pain Ear, nose and throat: No hearing loss, ear pain, nasal congestion, sore throat Cardiovascular: No chest pain, palpitations Respiratory:  No shortness of breath at rest or with exertion, wheezes GastrointestinaI: No nausea, vomiting, diarrhea, abdominal pain, fecal incontinence Genitourinary:  No dysuria, urinary retention or frequency Musculoskeletal:  No neck pain, +back pain Integumentary: No rash, pruritus, skin lesions Neurological: as above Psychiatric: No depression, insomnia, anxiety Endocrine: No  palpitations, fatigue, diaphoresis, mood swings, change in appetite, change in weight, increased thirst Hematologic/Lymphatic:  No anemia, purpura, petechiae. Allergic/Immunologic: no itchy/runny eyes, nasal congestion, recent allergic reactions, rashes  PHYSICAL EXAM: Vitals:   04/22/17 1558  BP: 140/62  Pulse: 69   General: No acute distress, reduced blink rate, hypomimia Head:  Normocephalic/atraumatic Neck: supple, no paraspinal tenderness, full range of motion Heart:  Regular rate and rhythm Lungs:  Clear to auscultation bilaterally Back: No paraspinal tenderness Skin/Extremities: No rash, no edema Neurological Exam: alert and oriented to person, place. No aphasia or dysarthria. Fund of knowledge is appropriate.  Remote memory intact. 0/3 delayed recall.  Attention and concentration are normal. Able to name objects and repeat phrases. Cranial nerves: Pupils equal, round, reactive to light. Extraocular movements intact with no nystagmus. Visual fields full. Facial sensation intact. No facial asymmetry. Tongue, uvula, palate midline.  Motor: +cogwheeling on both wrists. Muscle strength 5/5 throughout with no pronator drift.  Sensation to light touch intact.  No extinction to double simultaneous stimulation.  Deep tendon reflexes brisk 2+ throughout, toes downgoing.  Finger to nose testing intact.  Gait slow and cautious with walker, stooped posture that appears consistent with camptocormia. No resting, postural or action tremor. +reduced finger and foot taps bilaterally  IMPRESSION: This is a pleasant 83 yo RH man with a history of hypertension, hyperlipidemia, myelodysplasia, anemia, lumbar stenosis, with worsening memory. MMSE in October 2017 was 17/30, indicating moderate dementia (18/30 in January 2017, 21/30 in April 2016). MRI brain unremarkable. He is taking Namenda 10mg  BID without side effects. His daughter expressed concern about worsening stiffness with difficulty getting him out of  bed, and tremors. No tremors noted in office today, but he is noted to be rigid, with cogwheeling in both wrists, reduced finger and foot taps. 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 We discussed the behavioral changes as well that are likely due to underlying dementia. We discussed medications, they would like to start medication to hopefully help with increasing his movements, start Sinemet 25/100mg  1/2 tab TID. Side effects were discussed. They are looking into getting more  help at home. We again discussed the importance of physical exercise and brain stimulation exercises for brain health.He will follow-up in 6 months and knows to call our office for any changes in the interim.  Thank you for allowing me to participate in his care.  Please do not hesitate to call for any questions or concerns.  The duration of this appointment visit was 25 minutes of face-to-face time with the patient.  Greater than 50% of this time was spent in counseling, explanation of diagnosis, planning of further management, and coordination of care.   Ellouise Newer, M.D.   CC: Dr. Yong Channel

## 2017-04-23 ENCOUNTER — Telehealth: Payer: Self-pay | Admitting: Neurology

## 2017-04-23 ENCOUNTER — Encounter: Payer: Self-pay | Admitting: Neurology

## 2017-04-23 ENCOUNTER — Telehealth: Payer: Self-pay | Admitting: Family Medicine

## 2017-04-23 NOTE — Telephone Encounter (Signed)
Please call pt daughter back with her dad results. Pt has dementia

## 2017-04-23 NOTE — Telephone Encounter (Signed)
Per DPR daughter Ezell Poke is aware of lab results

## 2017-04-23 NOTE — Telephone Encounter (Signed)
We called in sinemet to the local drug store but in the future we will need to use the mail order pharmacy. They will pick this one up at the local one this time

## 2017-04-24 NOTE — Telephone Encounter (Signed)
Changed primary pharmacy in the system

## 2017-05-01 ENCOUNTER — Other Ambulatory Visit: Payer: Self-pay | Admitting: Neurology

## 2017-05-01 DIAGNOSIS — F03B0 Unspecified dementia, moderate, without behavioral disturbance, psychotic disturbance, mood disturbance, and anxiety: Secondary | ICD-10-CM

## 2017-05-01 DIAGNOSIS — F0390 Unspecified dementia without behavioral disturbance: Secondary | ICD-10-CM

## 2017-05-06 ENCOUNTER — Ambulatory Visit: Payer: Medicare Other | Admitting: Hematology & Oncology

## 2017-05-06 ENCOUNTER — Ambulatory Visit: Payer: Medicare Other

## 2017-05-06 ENCOUNTER — Other Ambulatory Visit: Payer: Medicare Other

## 2017-05-08 ENCOUNTER — Ambulatory Visit (HOSPITAL_BASED_OUTPATIENT_CLINIC_OR_DEPARTMENT_OTHER): Payer: Medicare Other | Admitting: Hematology & Oncology

## 2017-05-08 ENCOUNTER — Ambulatory Visit (HOSPITAL_BASED_OUTPATIENT_CLINIC_OR_DEPARTMENT_OTHER): Payer: Medicare Other

## 2017-05-08 ENCOUNTER — Other Ambulatory Visit (HOSPITAL_BASED_OUTPATIENT_CLINIC_OR_DEPARTMENT_OTHER): Payer: Medicare Other

## 2017-05-08 VITALS — BP 168/48 | HR 77 | Temp 98.2°F | Resp 20 | Wt 168.0 lb

## 2017-05-08 DIAGNOSIS — D461 Refractory anemia with ring sideroblasts: Secondary | ICD-10-CM

## 2017-05-08 DIAGNOSIS — D469 Myelodysplastic syndrome, unspecified: Secondary | ICD-10-CM

## 2017-05-08 DIAGNOSIS — D510 Vitamin B12 deficiency anemia due to intrinsic factor deficiency: Secondary | ICD-10-CM

## 2017-05-08 DIAGNOSIS — M1990 Unspecified osteoarthritis, unspecified site: Secondary | ICD-10-CM

## 2017-05-08 LAB — CMP (CANCER CENTER ONLY)
ALBUMIN: 3.3 g/dL (ref 3.3–5.5)
ALK PHOS: 44 U/L (ref 26–84)
ALT: 24 U/L (ref 10–47)
AST: 23 U/L (ref 11–38)
BILIRUBIN TOTAL: 0.9 mg/dL (ref 0.20–1.60)
BUN, Bld: 17 mg/dL (ref 7–22)
CALCIUM: 8.7 mg/dL (ref 8.0–10.3)
CO2: 24 meq/L (ref 18–33)
CREATININE: 1.2 mg/dL (ref 0.6–1.2)
Chloride: 102 mEq/L (ref 98–108)
Glucose, Bld: 111 mg/dL (ref 73–118)
Potassium: 4.5 mEq/L (ref 3.3–4.7)
SODIUM: 129 meq/L (ref 128–145)
Total Protein: 5.8 g/dL — ABNORMAL LOW (ref 6.4–8.1)

## 2017-05-08 LAB — CBC WITH DIFFERENTIAL (CANCER CENTER ONLY)
BASO#: 0 10*3/uL (ref 0.0–0.2)
BASO%: 0.1 % (ref 0.0–2.0)
EOS%: 2.2 % (ref 0.0–7.0)
Eosinophils Absolute: 0.2 10*3/uL (ref 0.0–0.5)
HEMATOCRIT: 24.4 % — AB (ref 38.7–49.9)
HEMOGLOBIN: 8.3 g/dL — AB (ref 13.0–17.1)
LYMPH#: 1.9 10*3/uL (ref 0.9–3.3)
LYMPH%: 28.5 % (ref 14.0–48.0)
MCH: 33.7 pg — ABNORMAL HIGH (ref 28.0–33.4)
MCHC: 34 g/dL (ref 32.0–35.9)
MCV: 99 fL — ABNORMAL HIGH (ref 82–98)
MONO#: 0.6 10*3/uL (ref 0.1–0.9)
MONO%: 8.8 % (ref 0.0–13.0)
NEUT%: 60.4 % (ref 40.0–80.0)
NEUTROS ABS: 4 10*3/uL (ref 1.5–6.5)
Platelets: 488 10*3/uL — ABNORMAL HIGH (ref 145–400)
RBC: 2.46 10*6/uL — AB (ref 4.20–5.70)
RDW: 20.1 % — ABNORMAL HIGH (ref 11.1–15.7)
WBC: 6.7 10*3/uL (ref 4.0–10.0)

## 2017-05-08 MED ORDER — EPOETIN ALFA 40000 UNIT/ML IJ SOLN
INTRAMUSCULAR | Status: AC
  Filled 2017-05-08: qty 1

## 2017-05-08 MED ORDER — EPOETIN ALFA 40000 UNIT/ML IJ SOLN
40000.0000 [IU] | Freq: Once | INTRAMUSCULAR | Status: AC
  Administered 2017-05-08: 40000 [IU] via SUBCUTANEOUS

## 2017-05-08 NOTE — Patient Instructions (Signed)
Epoetin Alfa injection °What is this medicine? °EPOETIN ALFA (e POE e tin AL fa) helps your body make more red blood cells. This medicine is used to treat anemia caused by chronic kidney failure, cancer chemotherapy, or HIV-therapy. It may also be used before surgery if you have anemia. °This medicine may be used for other purposes; ask your health care provider or pharmacist if you have questions. °COMMON BRAND NAME(S): Epogen, Procrit °What should I tell my health care provider before I take this medicine? °They need to know if you have any of these conditions: °-blood clotting disorders °-cancer patient not on chemotherapy °-cystic fibrosis °-heart disease, such as angina or heart failure °-hemoglobin level of 12 g/dL or greater °-high blood pressure °-low levels of folate, iron, or vitamin B12 °-seizures °-an unusual or allergic reaction to erythropoietin, albumin, benzyl alcohol, hamster proteins, other medicines, foods, dyes, or preservatives °-pregnant or trying to get pregnant °-breast-feeding °How should I use this medicine? °This medicine is for injection into a vein or under the skin. It is usually given by a health care professional in a hospital or clinic setting. °If you get this medicine at home, you will be taught how to prepare and give this medicine. Use exactly as directed. Take your medicine at regular intervals. Do not take your medicine more often than directed. °It is important that you put your used needles and syringes in a special sharps container. Do not put them in a trash can. If you do not have a sharps container, call your pharmacist or healthcare provider to get one. °A special MedGuide will be given to you by the pharmacist with each prescription and refill. Be sure to read this information carefully each time. °Talk to your pediatrician regarding the use of this medicine in children. While this drug may be prescribed for selected conditions, precautions do apply. °Overdosage: If you  think you have taken too much of this medicine contact a poison control center or emergency room at once. °NOTE: This medicine is only for you. Do not share this medicine with others. °What if I miss a dose? °If you miss a dose, take it as soon as you can. If it is almost time for your next dose, take only that dose. Do not take double or extra doses. °What may interact with this medicine? °Do not take this medicine with any of the following medications: °-darbepoetin alfa °This list may not describe all possible interactions. Give your health care provider a list of all the medicines, herbs, non-prescription drugs, or dietary supplements you use. Also tell them if you smoke, drink alcohol, or use illegal drugs. Some items may interact with your medicine. °What should I watch for while using this medicine? °Your condition will be monitored carefully while you are receiving this medicine. °You may need blood work done while you are taking this medicine. °What side effects may I notice from receiving this medicine? °Side effects that you should report to your doctor or health care professional as soon as possible: °-allergic reactions like skin rash, itching or hives, swelling of the face, lips, or tongue °-breathing problems °-changes in vision °-chest pain °-confusion, trouble speaking or understanding °-feeling faint or lightheaded, falls °-high blood pressure °-muscle aches or pains °-pain, swelling, warmth in the leg °-rapid weight gain °-severe headaches °-sudden numbness or weakness of the face, arm or leg °-trouble walking, dizziness, loss of balance or coordination °-seizures (convulsions) °-swelling of the ankles, feet, hands °-unusually weak or tired °  Side effects that usually do not require medical attention (report to your doctor or health care professional if they continue or are bothersome): °-diarrhea °-fever, chills (flu-like symptoms) °-headaches °-nausea, vomiting °-redness, stinging, or swelling at  site where injected °This list may not describe all possible side effects. Call your doctor for medical advice about side effects. You may report side effects to FDA at 1-800-FDA-1088. °Where should I keep my medicine? °Keep out of the reach of children. °Store in a refrigerator between 2 and 8 degrees C (36 and 46 degrees F). Do not freeze or shake. Throw away any unused portion if using a single-dose vial. Multi-dose vials can be kept in the refrigerator for up to 21 days after the initial dose. Throw away unused medicine. °NOTE: This sheet is a summary. It may not cover all possible information. If you have questions about this medicine, talk to your doctor, pharmacist, or health care provider. °© 2018 [employer] (2016-06-25 19:42:31) ° °

## 2017-05-08 NOTE — Progress Notes (Signed)
Alexander Rice available to the [facility] of As well as bone or  Hematology and Oncology Follow Up Visit  Alexander Rice [id number] Oct 25, 1932 83 y.o. 05/08/2017   Principle Diagnosis:   Refractory anemia with ringed sideroblasts.  Pernicious anemia  Severe osteoarthritis  Current Therapy:    Procrit 40,000 units subcutaneous every 3 weeks  Vitamin B6 250 mg by mouth daily  Vitamin B-12 1 mg IM every month-done at home  Blood transfusions as indicated-last transfusion on 07/31/2016     Interim History:  Alexander Rice is back for follow-up.  his dementia continues to be his biggest problem.  It sounds like in a nice father's day weekend. He really cannot remember what he did.  Back in late May, his iron studies looked all right. His ferritin was 938. His iron saturation was 61%.  He's had no fever. He's had no change in bowel or bladder habits. There's been no incontinence.  He has had no cough. He's had no rash.   His family really is doing a good job with him.  Overall, his performance status is ECOG 3.  Medications:  Current Outpatient Prescriptions:  .  aspirin 81 MG tablet, Take 81 mg by mouth daily.  , Disp: , Rfl:  .  carbidopa-levodopa (SINEMET IR) 25-100 MG tablet, Take 1/2 tablet three times a day, Disp: 135 tablet, Rfl: 3 .  CVS OMEGA-3 KRILL OIL PO, Take 350 mg by mouth daily. Reported on 01/16/2016, Disp: , Rfl:  .  cyanocobalamin 100 MCG tablet, Take by mouth daily., Disp: , Rfl:  .  dorzolamide (TRUSOPT) 2 % ophthalmic solution, Place 1 drop into both eyes 2 (two) times daily.  , Disp: , Rfl:  .  doxazosin (CARDURA) 4 MG tablet, TAKE 1 TABLET BY MOUTH AT  BEDTIME, Disp: 90 tablet, Rfl: 3 .  lisinopril (PRINIVIL,ZESTRIL) 5 MG tablet, Take 1 tablet (5 mg total) by mouth daily., Disp: 90 tablet, Rfl: 3 .  LUMIGAN 0.01 % SOLN, Place 1 drop into both eyes at bedtime. , Disp: , Rfl:  .  memantine (NAMENDA) 10 MG tablet, TAKE 1 TABLET BY MOUTH TWO  TIMES DAILY,  Disp: 180 tablet, Rfl: 3 .  Multiple Vitamin (MULTIVITAMIN) tablet, Take 1 tablet by mouth daily.  , Disp: , Rfl:  .  omeprazole (PRILOSEC) 20 MG capsule, TAKE 1 CAPSULE BY MOUTH  DAILY, Disp: 90 capsule, Rfl: 3 .  Probiotic Product (CVS PROBIOTIC) CHEW, Chew by mouth daily., Disp: , Rfl:  .  Pyridoxine HCl (VITAMIN B-6) 250 MG tablet, Take 1 tablet (250 mg total) by mouth daily., Disp: 90 tablet, Rfl: 3 .  simvastatin (ZOCOR) 40 MG tablet, TAKE 1 TABLET BY MOUTH  DAILY AT 6 PM, Disp: 90 tablet, Rfl: 3 No current facility-administered medications for this visit.   Facility-Administered Medications Ordered in Other Visits:  .  darbepoetin (ARANESP) injection 300 mcg, 300 mcg, Subcutaneous, Once, Ennever, Rudell Cobb, MD  Allergies: No Known Allergies  Past Medical History, Surgical history, Social history, and Family History were reviewed and updated.  Review of Systems: As above  Physical Exam:  weight is 168 lb (76.2 kg). His oral temperature is 98.2 F (36.8 C). His blood pressure is 168/48 (abnormal) and his pulse is 77. His respiration is 20 and oxygen saturation is 100%.   Wt Readings from Last 3 Encounters:  05/08/17 168 lb (76.2 kg)  04/22/17 168 lb (76.2 kg)  04/19/17 166 lb 6.4 oz (75.5 kg)  Elderly white gentleman in no obvious distress. Head and neck exam shows no ocular or oral lesions. There are no palpable cervical or supraclavicular lymph nodes. Lungs are clear. Cardiac exam regular rate and rhythm with no murmurs, rubs or bruits. Abdomen is soft. He has good bowel sounds. There is no fluid wave. There is no palpable liver or spleen tip. Back exam shows some kyphosis. Extremities shows some age-related osteoarthritic changes. He has decent range of motion of his joints. Skin exam shows no rashes, ecchymoses or petechia.  Lab Results  Component Value Date   WBC 6.7 05/08/2017   HGB 8.3 (L) 05/08/2017   HCT 24.4 (L) 05/08/2017   MCV 99 (H) 05/08/2017   PLT 488 (H)  05/08/2017     Chemistry      Component Value Date/Time   NA 129 05/08/2017 1418   NA 131 (L) 07/30/2016 1039   K 4.5 05/08/2017 1418   K 4.4 07/30/2016 1039   CL 102 05/08/2017 1418   CO2 24 05/08/2017 1418   CO2 21 (L) 07/30/2016 1039   BUN 17 05/08/2017 1418   BUN 20.3 07/30/2016 1039   CREATININE 1.2 05/08/2017 1418   CREATININE 0.9 07/30/2016 1039      Component Value Date/Time   CALCIUM 8.7 05/08/2017 1418   CALCIUM 8.7 07/30/2016 1039   ALKPHOS 44 05/08/2017 1418   ALKPHOS 47 07/30/2016 1039   AST 23 05/08/2017 1418   AST 17 07/30/2016 1039   ALT 24 05/08/2017 1418   ALT 18 07/30/2016 1039   BILITOT 0.90 05/08/2017 1418   BILITOT 0.93 07/30/2016 1039         Impression and Plan: Alexander Rice is 83 year old gentleman with myelodysplasia. He has refractory anemia with ringed sideroblasts.   I will go ahead and give him Procrit today.  We will plan to get him back in another 6 weeks. Thankfully, his hemoglobin is holding well to the steady and I'm happy about that.   I know that his dementia will be the ultimate demise for him. I can tell that he worsens as we see him over the months.   [name], MD 6/20/20183:25 PM

## 2017-05-09 LAB — RETICULOCYTES: RETICULOCYTE COUNT: 1.2 % (ref 0.6–2.6)

## 2017-05-09 LAB — IRON AND TIBC
%SAT: 57 % — ABNORMAL HIGH (ref 20–55)
IRON: 110 ug/dL (ref 42–163)
TIBC: 193 ug/dL — AB (ref 202–409)
UIBC: 83 ug/dL — ABNORMAL LOW (ref 117–376)

## 2017-05-09 LAB — FERRITIN: Ferritin: 842 ng/ml — ABNORMAL HIGH (ref 22–316)

## 2017-05-14 ENCOUNTER — Other Ambulatory Visit: Payer: Self-pay | Admitting: Family Medicine

## 2017-05-27 ENCOUNTER — Ambulatory Visit: Payer: Medicare Other

## 2017-05-27 ENCOUNTER — Other Ambulatory Visit: Payer: Medicare Other

## 2017-05-27 ENCOUNTER — Ambulatory Visit: Payer: Medicare Other | Admitting: Hematology & Oncology

## 2017-05-28 ENCOUNTER — Other Ambulatory Visit: Payer: Medicare Other

## 2017-05-28 ENCOUNTER — Ambulatory Visit: Payer: Medicare Other | Admitting: Hematology & Oncology

## 2017-05-28 ENCOUNTER — Ambulatory Visit: Payer: Medicare Other

## 2017-05-30 ENCOUNTER — Ambulatory Visit: Payer: Medicare Other | Admitting: Internal Medicine

## 2017-05-31 DIAGNOSIS — H401131 Primary open-angle glaucoma, bilateral, mild stage: Secondary | ICD-10-CM | POA: Diagnosis not present

## 2017-05-31 DIAGNOSIS — H35363 Drusen (degenerative) of macula, bilateral: Secondary | ICD-10-CM | POA: Diagnosis not present

## 2017-05-31 DIAGNOSIS — H04123 Dry eye syndrome of bilateral lacrimal glands: Secondary | ICD-10-CM | POA: Diagnosis not present

## 2017-05-31 DIAGNOSIS — H2513 Age-related nuclear cataract, bilateral: Secondary | ICD-10-CM | POA: Diagnosis not present

## 2017-06-03 ENCOUNTER — Telehealth: Payer: Self-pay | Admitting: Family Medicine

## 2017-06-03 NOTE — Telephone Encounter (Signed)
Pts daughter would like to have a referral for in-house physical therapy.  Daughter Curt Bears) would like to have a call back to discuss in full.

## 2017-06-06 NOTE — Telephone Encounter (Signed)
pts daughter is calling to check the status of the referral

## 2017-06-10 ENCOUNTER — Other Ambulatory Visit: Payer: Self-pay

## 2017-06-10 DIAGNOSIS — M48061 Spinal stenosis, lumbar region without neurogenic claudication: Secondary | ICD-10-CM

## 2017-06-10 NOTE — Telephone Encounter (Signed)
Referral placed for home health/PT.

## 2017-06-10 NOTE — Telephone Encounter (Signed)
Daughter Curt Bears is aware. I also instructed her that we may have to make an appointment for her dad as normally they want a face to face with in 30 days

## 2017-06-17 ENCOUNTER — Other Ambulatory Visit (HOSPITAL_BASED_OUTPATIENT_CLINIC_OR_DEPARTMENT_OTHER): Payer: Medicare Other

## 2017-06-17 ENCOUNTER — Ambulatory Visit (HOSPITAL_BASED_OUTPATIENT_CLINIC_OR_DEPARTMENT_OTHER): Payer: Medicare Other

## 2017-06-17 ENCOUNTER — Ambulatory Visit (HOSPITAL_BASED_OUTPATIENT_CLINIC_OR_DEPARTMENT_OTHER): Payer: Medicare Other | Admitting: Family

## 2017-06-17 VITALS — BP 149/49 | HR 76 | Temp 98.0°F | Resp 16 | Wt 169.0 lb

## 2017-06-17 DIAGNOSIS — D461 Refractory anemia with ring sideroblasts: Secondary | ICD-10-CM

## 2017-06-17 DIAGNOSIS — D510 Vitamin B12 deficiency anemia due to intrinsic factor deficiency: Secondary | ICD-10-CM | POA: Diagnosis not present

## 2017-06-17 DIAGNOSIS — D464 Refractory anemia, unspecified: Secondary | ICD-10-CM

## 2017-06-17 DIAGNOSIS — D469 Myelodysplastic syndrome, unspecified: Secondary | ICD-10-CM

## 2017-06-17 DIAGNOSIS — M1990 Unspecified osteoarthritis, unspecified site: Secondary | ICD-10-CM | POA: Diagnosis not present

## 2017-06-17 LAB — CMP (CANCER CENTER ONLY)
ALT: 13 U/L (ref 10–47)
AST: 24 U/L (ref 11–38)
Albumin: 3.3 g/dL (ref 3.3–5.5)
Alkaline Phosphatase: 61 U/L (ref 26–84)
BUN: 18 mg/dL (ref 7–22)
CHLORIDE: 99 meq/L (ref 98–108)
CO2: 25 mEq/L (ref 18–33)
CREATININE: 1.3 mg/dL — AB (ref 0.6–1.2)
Calcium: 8.8 mg/dL (ref 8.0–10.3)
GLUCOSE: 122 mg/dL — AB (ref 73–118)
Potassium: 4.3 mEq/L (ref 3.3–4.7)
SODIUM: 128 meq/L (ref 128–145)
TOTAL PROTEIN: 5.9 g/dL — AB (ref 6.4–8.1)
Total Bilirubin: 1 mg/dl (ref 0.20–1.60)

## 2017-06-17 LAB — CBC WITH DIFFERENTIAL (CANCER CENTER ONLY)
BASO#: 0 10*3/uL (ref 0.0–0.2)
BASO%: 0.2 % (ref 0.0–2.0)
EOS%: 2.6 % (ref 0.0–7.0)
Eosinophils Absolute: 0.2 10*3/uL (ref 0.0–0.5)
HCT: 23.4 % — ABNORMAL LOW (ref 38.7–49.9)
HEMOGLOBIN: 8 g/dL — AB (ref 13.0–17.1)
LYMPH#: 1.7 10*3/uL (ref 0.9–3.3)
LYMPH%: 26.8 % (ref 14.0–48.0)
MCH: 33.8 pg — ABNORMAL HIGH (ref 28.0–33.4)
MCHC: 34.2 g/dL (ref 32.0–35.9)
MCV: 99 fL — ABNORMAL HIGH (ref 82–98)
MONO#: 0.6 10*3/uL (ref 0.1–0.9)
MONO%: 9.4 % (ref 0.0–13.0)
NEUT%: 61 % (ref 40.0–80.0)
NEUTROS ABS: 3.8 10*3/uL (ref 1.5–6.5)
Platelets: 549 10*3/uL — ABNORMAL HIGH (ref 145–400)
RBC: 2.37 10*6/uL — AB (ref 4.20–5.70)
RDW: 20.2 % — ABNORMAL HIGH (ref 11.1–15.7)
WBC: 6.2 10*3/uL (ref 4.0–10.0)

## 2017-06-17 MED ORDER — EPOETIN ALFA 40000 UNIT/ML IJ SOLN
40000.0000 [IU] | Freq: Once | INTRAMUSCULAR | Status: AC
  Administered 2017-06-17: 40000 [IU] via SUBCUTANEOUS

## 2017-06-17 MED ORDER — EPOETIN ALFA 40000 UNIT/ML IJ SOLN
INTRAMUSCULAR | Status: AC
Start: 2017-06-17 — End: 2017-06-17
  Filled 2017-06-17: qty 1

## 2017-06-17 NOTE — Patient Instructions (Signed)
Epoetin Alfa injection °What is this medicine? °EPOETIN ALFA (e POE e tin AL fa) helps your body make more red blood cells. This medicine is used to treat anemia caused by chronic kidney failure, cancer chemotherapy, or HIV-therapy. It may also be used before surgery if you have anemia. °This medicine may be used for other purposes; ask your health care provider or pharmacist if you have questions. °COMMON BRAND NAME(S): Epogen, Procrit °What should I tell my health care provider before I take this medicine? °They need to know if you have any of these conditions: °-blood clotting disorders °-cancer patient not on chemotherapy °-cystic fibrosis °-heart disease, such as angina or heart failure °-hemoglobin level of 12 g/dL or greater °-high blood pressure °-low levels of folate, iron, or vitamin B12 °-seizures °-an unusual or allergic reaction to erythropoietin, albumin, benzyl alcohol, hamster proteins, other medicines, foods, dyes, or preservatives °-pregnant or trying to get pregnant °-breast-feeding °How should I use this medicine? °This medicine is for injection into a vein or under the skin. It is usually given by a health care professional in a hospital or clinic setting. °If you get this medicine at home, you will be taught how to prepare and give this medicine. Use exactly as directed. Take your medicine at regular intervals. Do not take your medicine more often than directed. °It is important that you put your used needles and syringes in a special sharps container. Do not put them in a trash can. If you do not have a sharps container, call your pharmacist or healthcare provider to get one. °A special MedGuide will be given to you by the pharmacist with each prescription and refill. Be sure to read this information carefully each time. °Talk to your pediatrician regarding the use of this medicine in children. While this drug may be prescribed for selected conditions, precautions do apply. °Overdosage: If you  think you have taken too much of this medicine contact a poison control center or emergency room at once. °NOTE: This medicine is only for you. Do not share this medicine with others. °What if I miss a dose? °If you miss a dose, take it as soon as you can. If it is almost time for your next dose, take only that dose. Do not take double or extra doses. °What may interact with this medicine? °Do not take this medicine with any of the following medications: °-darbepoetin alfa °This list may not describe all possible interactions. Give your health care provider a list of all the medicines, herbs, non-prescription drugs, or dietary supplements you use. Also tell them if you smoke, drink alcohol, or use illegal drugs. Some items may interact with your medicine. °What should I watch for while using this medicine? °Your condition will be monitored carefully while you are receiving this medicine. °You may need blood work done while you are taking this medicine. °What side effects may I notice from receiving this medicine? °Side effects that you should report to your doctor or health care professional as soon as possible: °-allergic reactions like skin rash, itching or hives, swelling of the face, lips, or tongue °-breathing problems °-changes in vision °-chest pain °-confusion, trouble speaking or understanding °-feeling faint or lightheaded, falls °-high blood pressure °-muscle aches or pains °-pain, swelling, warmth in the leg °-rapid weight gain °-severe headaches °-sudden numbness or weakness of the face, arm or leg °-trouble walking, dizziness, loss of balance or coordination °-seizures (convulsions) °-swelling of the ankles, feet, hands °-unusually weak or tired °  Side effects that usually do not require medical attention (report to your doctor or health care professional if they continue or are bothersome): °-diarrhea °-fever, chills (flu-like symptoms) °-headaches °-nausea, vomiting °-redness, stinging, or swelling at  site where injected °This list may not describe all possible side effects. Call your doctor for medical advice about side effects. You may report side effects to FDA at 1-800-FDA-1088. °Where should I keep my medicine? °Keep out of the reach of children. °Store in a refrigerator between 2 and 8 degrees C (36 and 46 degrees F). Do not freeze or shake. Throw away any unused portion if using a single-dose vial. Multi-dose vials can be kept in the refrigerator for up to 21 days after the initial dose. Throw away unused medicine. °NOTE: This sheet is a summary. It may not cover all possible information. If you have questions about this medicine, talk to your doctor, pharmacist, or health care provider. °© 2018 [employer] (2016-06-25 19:42:31) ° °

## 2017-06-17 NOTE — Progress Notes (Signed)
Hematology and Oncology Follow Up Visit  Alexander Rice [id number] [birth year] 83 y.o. 06/17/2017   Principle Diagnosis:  Refractory anemia with ringed sideroblasts. Pernicious anemia Severe osteoarthritis  Current Therapy:   Procrit 40,000 units subcutaneous every 3 weeks Vitamin B6 250 mg by mouth daily Vitamin B-12 1 mg IM every month-done at home Blood transfusions as indicated-last transfusion on 07/31/2016   Interim History:  Alexander Rice is here today with his wife and daughter for follow-up. He is doing well and has no complaints at this time. He is pleasantly confused and loves to tell us stories about his days with the [employer].  His Hgb is 8.0. He has had no episodes of bleeding. No excessive bruising or petechiae.  He has occasional SOB with over exertion. He will take breaks to rest as needed.  His wife and daughter are good to get him up and walk for some exercise.  He has had no fever, chills, n/v, cough, rash, dizziness, chest pain, palpitations, abdominal pain ro changes in bowel or bladder habits.  No swelling, tenderness, numbness or tingling in his extremities. No c/o pain.  He has maintained a good appetite and is staying well hydrated. His weight is stable.    ECOG Performance Status: 1 - Symptomatic but completely ambulatory  Medications:  Allergies as of 06/17/2017   No Known Allergies     Medication List       Accurate as of 06/17/17  3:59 PM. Always use your most recent med list.          aspirin 81 MG tablet Take 81 mg by mouth daily.   carbidopa-levodopa 25-100 MG tablet Commonly known as:  SINEMET IR Take 1/2 tablet three times a day   CVS OMEGA-3 KRILL OIL PO Take 350 mg by mouth daily. Reported on 01/16/2016   CVS PROBIOTIC Chew Chew by mouth daily.   cyanocobalamin 100 MCG tablet Take by mouth daily.   dorzolamide 2 % ophthalmic solution Commonly known as:  TRUSOPT Place 1 drop into both eyes 2 (two) times daily.   doxazosin 4 MG tablet Commonly known as:  CARDURA TAKE 1 TABLET BY MOUTH AT  BEDTIME   lisinopril 5 MG tablet Commonly known as:  PRINIVIL,ZESTRIL TAKE 1 TABLET BY MOUTH  DAILY   LUMIGAN 0.01 % Soln Generic drug:  bimatoprost Place 1 drop into both eyes at bedtime.   memantine 10 MG tablet Commonly known as:  NAMENDA TAKE 1 TABLET BY MOUTH TWO  TIMES DAILY   multivitamin tablet Take 1 tablet by mouth daily.   omeprazole 20 MG capsule Commonly known as:  PRILOSEC TAKE 1 CAPSULE BY MOUTH  DAILY   simvastatin 40 MG tablet Commonly known as:  ZOCOR TAKE 1 TABLET BY MOUTH  DAILY AT 6 PM   vitamin B-6 250 MG tablet Take 1 tablet (250 mg total) by mouth daily.       Allergies: No Known Allergies  Past Medical History, Surgical history, Social history, and Family History were reviewed and updated.  Review of Systems: All other 10 point review of systems is negative.   Physical Exam:  vitals were not taken for this visit.  Wt Readings from Last 3 Encounters:  05/08/17 168 lb (76.2 kg)  04/22/17 168 lb (76.2 kg)  04/19/17 166 lb 6.4 oz (75.5 kg)    Ocular: Sclerae unicteric, pupils equal, round and reactive to light Ear-nose-throat: Oropharynx clear, dentition fair Lymphatic: No cervical, supraclavicular or axillary adenopathy Lungs no  rales or rhonchi, good excursion bilaterally Heart regular rate and rhythm, no murmur appreciated Abd soft, nontender, positive bowel sounds, no liver or spleen tip palpated on exam, no fluid wave  MSK no focal spinal tenderness, no joint edema Neuro: non-focal, well-oriented, appropriate affect Breasts: Deferred   Lab Results  Component Value Date   WBC 6.2 06/17/2017   HGB 8.0 (L) 06/17/2017   HCT 23.4 (L) 06/17/2017   MCV 99 (H) 06/17/2017   PLT 549 (H) 06/17/2017   Lab Results  Component Value Date   FERRITIN 842 (H) 05/08/2017   IRON 110 05/08/2017   TIBC 193 (L) 05/08/2017   UIBC 83 (L) 05/08/2017   IRONPCTSAT 57  (H) 05/08/2017   Lab Results  Component Value Date   RETICCTPCT 1.0 11/25/2015   RBC 2.37 (L) 06/17/2017   RETICCTABS 27.2 11/25/2015   Lab Results  Component Value Date   KPAFRELGTCHN 3.09 (H) 04/22/2008   LAMBDASER 1.01 04/22/2008   KAPLAMBRATIO 3.06 (H) 04/22/2008   No results found for: Osborne Casco Lab Results  Component Value Date   TOTALPROTELP 6.3 04/22/2008   ALBUMINELP 66.3 (H) 04/22/2008   A1GS 3.9 04/22/2008   A2GS 8.1 04/22/2008   BETS 5.2 04/22/2008   BETA2SER 4.0 04/22/2008   GAMS 12.5 04/22/2008   MSPIKE NOT DET 04/22/2008   SPEI * 04/22/2008     Chemistry      Component Value Date/Time   NA 128 06/17/2017 1529   NA 131 (L) 07/30/2016 1039   K 4.3 06/17/2017 1529   K 4.4 07/30/2016 1039   CL 99 06/17/2017 1529   CO2 25 06/17/2017 1529   CO2 21 (L) 07/30/2016 1039   BUN 18 06/17/2017 1529   BUN 20.3 07/30/2016 1039   CREATININE 1.3 (H) 06/17/2017 1529   CREATININE 0.9 07/30/2016 1039      Component Value Date/Time   CALCIUM 8.8 06/17/2017 1529   CALCIUM 8.7 07/30/2016 1039   ALKPHOS 61 06/17/2017 1529   ALKPHOS 47 07/30/2016 1039   AST 24 06/17/2017 1529   AST 17 07/30/2016 1039   ALT 13 06/17/2017 1529   ALT 18 07/30/2016 1039   BILITOT 1.00 06/17/2017 1529   BILITOT 0.93 07/30/2016 1039      Impression and Plan: Alexander Rice is a pleasant caucasian gentleman with myelodysplasia and refractory anemia with ringed sideroblasts. He has done well so far with Procrit. Hgb is 8.0 so we will proceed with his injection today as planned.  We will plan to see him back again in 3 weeks for repeat lab work and follow-up.  HIs family does a wonderful job caring for him and are good to let us know if they have any questions or concerns. We can certainly see him sooner if need be.    Eliezer Bottom, NP 7/30/20183:59 PM

## 2017-06-18 LAB — IRON AND TIBC
%SAT: 60 % — ABNORMAL HIGH (ref 20–55)
IRON: 115 ug/dL (ref 42–163)
TIBC: 191 ug/dL — AB (ref 202–409)
UIBC: 76 ug/dL — AB (ref 117–376)

## 2017-06-18 LAB — RETICULOCYTES: RETICULOCYTE COUNT: 1.3 % (ref 0.6–2.6)

## 2017-06-18 LAB — FERRITIN: Ferritin: 777 ng/ml — ABNORMAL HIGH (ref 22–316)

## 2017-07-02 NOTE — Telephone Encounter (Signed)
Pt daughter is still waiting for home health PT referral. Please call daughter

## 2017-07-03 ENCOUNTER — Other Ambulatory Visit: Payer: Self-pay

## 2017-07-03 DIAGNOSIS — F0390 Unspecified dementia without behavioral disturbance: Secondary | ICD-10-CM

## 2017-07-03 NOTE — Telephone Encounter (Signed)
Spoke with Dian Situ from [facility] who will evaluate and set up services

## 2017-07-03 NOTE — Telephone Encounter (Signed)
Called daughter Curt Bears and left a voicemail message letting her know.

## 2017-07-05 ENCOUNTER — Ambulatory Visit: Payer: Medicare Other | Admitting: Internal Medicine

## 2017-07-08 ENCOUNTER — Telehealth: Payer: Self-pay | Admitting: Neurology

## 2017-07-08 ENCOUNTER — Ambulatory Visit (HOSPITAL_BASED_OUTPATIENT_CLINIC_OR_DEPARTMENT_OTHER): Payer: Medicare Other

## 2017-07-08 ENCOUNTER — Other Ambulatory Visit (HOSPITAL_BASED_OUTPATIENT_CLINIC_OR_DEPARTMENT_OTHER): Payer: Medicare Other

## 2017-07-08 ENCOUNTER — Other Ambulatory Visit: Payer: Self-pay

## 2017-07-08 ENCOUNTER — Ambulatory Visit (HOSPITAL_BASED_OUTPATIENT_CLINIC_OR_DEPARTMENT_OTHER): Payer: Medicare Other | Admitting: Hematology & Oncology

## 2017-07-08 VITALS — BP 148/52 | HR 74 | Temp 97.7°F | Resp 19 | Wt 170.0 lb

## 2017-07-08 DIAGNOSIS — D469 Myelodysplastic syndrome, unspecified: Secondary | ICD-10-CM

## 2017-07-08 DIAGNOSIS — M1990 Unspecified osteoarthritis, unspecified site: Secondary | ICD-10-CM | POA: Diagnosis not present

## 2017-07-08 DIAGNOSIS — D464 Refractory anemia, unspecified: Secondary | ICD-10-CM | POA: Diagnosis not present

## 2017-07-08 DIAGNOSIS — D461 Refractory anemia with ring sideroblasts: Secondary | ICD-10-CM

## 2017-07-08 DIAGNOSIS — F0390 Unspecified dementia without behavioral disturbance: Secondary | ICD-10-CM

## 2017-07-08 DIAGNOSIS — D510 Vitamin B12 deficiency anemia due to intrinsic factor deficiency: Secondary | ICD-10-CM

## 2017-07-08 DIAGNOSIS — G20 Parkinson's disease: Secondary | ICD-10-CM

## 2017-07-08 LAB — CBC WITH DIFFERENTIAL (CANCER CENTER ONLY)
BASO#: 0 10*3/uL (ref 0.0–0.2)
BASO%: 0.4 % (ref 0.0–2.0)
EOS%: 2.8 % (ref 0.0–7.0)
Eosinophils Absolute: 0.2 10*3/uL (ref 0.0–0.5)
HCT: 24.4 % — ABNORMAL LOW (ref 38.7–49.9)
HEMOGLOBIN: 8.4 g/dL — AB (ref 13.0–17.1)
LYMPH#: 1.7 10*3/uL (ref 0.9–3.3)
LYMPH%: 22.5 % (ref 14.0–48.0)
MCH: 33.9 pg — ABNORMAL HIGH (ref 28.0–33.4)
MCHC: 34.4 g/dL (ref 32.0–35.9)
MCV: 98 fL (ref 82–98)
MONO#: 0.7 10*3/uL (ref 0.1–0.9)
MONO%: 8.9 % (ref 0.0–13.0)
NEUT#: 4.9 10*3/uL (ref 1.5–6.5)
NEUT%: 65.4 % (ref 40.0–80.0)
Platelets: 578 10*3/uL — ABNORMAL HIGH (ref 145–400)
RBC: 2.48 10*6/uL — AB (ref 4.20–5.70)
RDW: 20.7 % — AB (ref 11.1–15.7)
WBC: 7.4 10*3/uL (ref 4.0–10.0)

## 2017-07-08 LAB — CMP (CANCER CENTER ONLY)
ALK PHOS: 62 U/L (ref 26–84)
ALT: 11 U/L (ref 10–47)
AST: 24 U/L (ref 11–38)
Albumin: 3.5 g/dL (ref 3.3–5.5)
BILIRUBIN TOTAL: 1.2 mg/dL (ref 0.20–1.60)
BUN: 19 mg/dL (ref 7–22)
CALCIUM: 8.6 mg/dL (ref 8.0–10.3)
CO2: 27 mEq/L (ref 18–33)
CREATININE: 1.3 mg/dL — AB (ref 0.6–1.2)
Chloride: 98 mEq/L (ref 98–108)
GLUCOSE: 126 mg/dL — AB (ref 73–118)
Potassium: 4.7 mEq/L (ref 3.3–4.7)
SODIUM: 132 meq/L (ref 128–145)
Total Protein: 6.1 g/dL — ABNORMAL LOW (ref 6.4–8.1)

## 2017-07-08 MED ORDER — EPOETIN ALFA 40000 UNIT/ML IJ SOLN
INTRAMUSCULAR | Status: AC
  Filled 2017-07-08: qty 1

## 2017-07-08 MED ORDER — EPOETIN ALFA 40000 UNIT/ML IJ SOLN
40000.0000 [IU] | Freq: Once | INTRAMUSCULAR | Status: AC
  Administered 2017-07-08: 40000 [IU] via SUBCUTANEOUS

## 2017-07-08 MED ORDER — CARBIDOPA-LEVODOPA 25-100 MG PO TABS: ORAL_TABLET | ORAL | 3 refills | Status: AC

## 2017-07-08 NOTE — Telephone Encounter (Signed)
Alexander Rice left a message regarding PT and his prescription for Carbadopa and that it should have been sent ot [facility] order instead of local pharmacy, please resend

## 2017-07-08 NOTE — Telephone Encounter (Signed)
Rx sent to [facility].  Original was sent to local pharmacy per pt's request.  All future Rx's will be sent to [facility].

## 2017-07-08 NOTE — Progress Notes (Signed)
Her blood level available to the level of As well as bone or  Hematology and Oncology Follow Up Visit  Alexander Rice [id number] [birth year] 83 y.o. 07/08/2017   Principle Diagnosis:   Refractory anemia with ringed sideroblasts.  Pernicious anemia  Severe osteoarthritis  Current Therapy:    Procrit 40,000 units subcutaneous every 3 weeks  Vitamin B6 250 mg by mouth daily  Vitamin B-12 1 mg IM every month-done at home  Blood transfusions as indicated-last transfusion on 07/31/2016     Interim History:  Alexander Rice is back for follow-up.  his dementia continues to be his biggest problem.  As always, he is very pleasant. He likes to talk about a lot of different things.  He seems to be holding pretty steady with his blood. We've not had to transfuse him for a while.   His iron studies back in late July showed a ferritin of 777 with an iron saturation of 60% area again this is holding pretty steady.   His appetite is doing okay. He's had no nausea or vomiting. He's had no rashes. He's had no fever. He's had no bleeding.   Overall, his performance status is ECOG 3.  Medications:  Current Outpatient Prescriptions:  .  aspirin 81 MG tablet, Take 81 mg by mouth daily.  , Disp: , Rfl:  .  carbidopa-levodopa (SINEMET IR) 25-100 MG tablet, Take 1/2 tablet three times a day, Disp: 135 tablet, Rfl: 3 .  CVS OMEGA-3 KRILL OIL PO, Take 350 mg by mouth daily. Reported on 01/16/2016, Disp: , Rfl:  .  cyanocobalamin 100 MCG tablet, Take by mouth daily., Disp: , Rfl:  .  dorzolamide (TRUSOPT) 2 % ophthalmic solution, Place 1 drop into both eyes 2 (two) times daily.  , Disp: , Rfl:  .  doxazosin (CARDURA) 4 MG tablet, TAKE 1 TABLET BY MOUTH AT  BEDTIME, Disp: 90 tablet, Rfl: 3 .  lisinopril (PRINIVIL,ZESTRIL) 5 MG tablet, TAKE 1 TABLET BY MOUTH  DAILY, Disp: 90 tablet, Rfl: 3 .  LUMIGAN 0.01 % SOLN, Place 1 drop into both eyes at bedtime. , Disp: , Rfl:  .  memantine (NAMENDA) 10 MG  tablet, TAKE 1 TABLET BY MOUTH TWO  TIMES DAILY, Disp: 180 tablet, Rfl: 3 .  Multiple Vitamin (MULTIVITAMIN) tablet, Take 1 tablet by mouth daily.  , Disp: , Rfl:  .  omeprazole (PRILOSEC) 20 MG capsule, TAKE 1 CAPSULE BY MOUTH  DAILY, Disp: 90 capsule, Rfl: 3 .  Probiotic Product (CVS PROBIOTIC) CHEW, Chew by mouth daily., Disp: , Rfl:  .  Pyridoxine HCl (VITAMIN B-6) 250 MG tablet, Take 1 tablet (250 mg total) by mouth daily., Disp: 90 tablet, Rfl: 3 .  simvastatin (ZOCOR) 40 MG tablet, TAKE 1 TABLET BY MOUTH  DAILY AT 6 PM, Disp: 90 tablet, Rfl: 3 No current facility-administered medications for this visit.   Facility-Administered Medications Ordered in Other Visits:  .  darbepoetin (ARANESP) injection 300 mcg, 300 mcg, Subcutaneous, Once, Nakia Remmers, Rudell Cobb, MD  Allergies: No Known Allergies  Past Medical History, Surgical history, Social history, and Family History were reviewed and updated.  Review of Systems: As above  Physical Exam:  weight is 170 lb (77.1 kg). His oral temperature is 97.7 F (36.5 C). His blood pressure is 148/52 (abnormal) and his pulse is 74. His respiration is 19 and oxygen saturation is 100%.   Wt Readings from Last 3 Encounters:  07/08/17 170 lb (77.1 kg)  06/17/17 169 lb (76.7 kg)  05/08/17 168 lb (76.2 kg)     Elderly white gentleman in no obvious distress. Head and neck exam shows no ocular or oral lesions. There are no palpable cervical or supraclavicular lymph nodes. Lungs are clear. Cardiac exam regular rate and rhythm with no murmurs, rubs or bruits. Abdomen is soft. He has good bowel sounds. There is no fluid wave. There is no palpable liver or spleen tip. Back exam shows some kyphosis. Extremities shows some age-related osteoarthritic changes. He has decent range of motion of his joints. Skin exam shows no rashes, ecchymoses or petechia.  Lab Results  Component Value Date   WBC 7.4 07/08/2017   HGB 8.4 (L) 07/08/2017   HCT 24.4 (L) 07/08/2017     MCV 98 07/08/2017   PLT 578 (H) 07/08/2017     Chemistry      Component Value Date/Time   NA 132 07/08/2017 1355   NA 131 (L) 07/30/2016 1039   K 4.7 07/08/2017 1355   K 4.4 07/30/2016 1039   CL 98 07/08/2017 1355   CO2 27 07/08/2017 1355   CO2 21 (L) 07/30/2016 1039   BUN 19 07/08/2017 1355   BUN 20.3 07/30/2016 1039   CREATININE 1.3 (H) 07/08/2017 1355   CREATININE 0.9 07/30/2016 1039      Component Value Date/Time   CALCIUM 8.6 07/08/2017 1355   CALCIUM 8.7 07/30/2016 1039   ALKPHOS 62 07/08/2017 1355   ALKPHOS 47 07/30/2016 1039   AST 24 07/08/2017 1355   AST 17 07/30/2016 1039   ALT 11 07/08/2017 1355   ALT 18 07/30/2016 1039   BILITOT 1.20 07/08/2017 1355   BILITOT 0.93 07/30/2016 1039         Impression and Plan: Alexander Rice is 83 year old gentleman with myelodysplasia. He has refractory anemia with ringed sideroblasts.   I will go ahead and give him Procrit today.  We will plan to get him back in another 6 weeks. Thankfully, his hemoglobin is holding well to the steady and I'm happy about that.   I know that his dementia will be the ultimate demise for him. I can tell that he worsens as we see him over the months.   [name], MD 8/20/20182:47 PM

## 2017-07-09 ENCOUNTER — Telehealth: Payer: Self-pay | Admitting: Neurology

## 2017-07-09 LAB — FERRITIN: Ferritin: 803 ng/ml — ABNORMAL HIGH (ref 22–316)

## 2017-07-09 LAB — RETICULOCYTES: Reticulocyte Count: 1.1 % (ref 0.6–2.6)

## 2017-07-09 LAB — IRON AND TIBC
%SAT: 48 % (ref 20–55)
IRON: 95 ug/dL (ref 42–163)
TIBC: 196 ug/dL — AB (ref 202–409)
UIBC: 102 ug/dL — ABNORMAL LOW (ref 117–376)

## 2017-07-09 NOTE — Telephone Encounter (Signed)
Spoke with pt's daughter, letting her know that the Rx was sent to the pharmacy yesterday.

## 2017-07-09 NOTE — Telephone Encounter (Signed)
Patient daughter would like a call about patient refill. She would like [place] to send the carbiopa levodopa sent to the mail order pharmacy [facility]  Patient is getting low on the medication

## 2017-07-11 ENCOUNTER — Telehealth: Payer: Self-pay | Admitting: Family Medicine

## 2017-07-11 NOTE — Telephone Encounter (Signed)
Viviann Spare is calling  concerning PT referral and would like jamie to return her call

## 2017-07-12 ENCOUNTER — Ambulatory Visit (INDEPENDENT_AMBULATORY_CARE_PROVIDER_SITE_OTHER): Payer: Medicare Other | Admitting: Podiatry

## 2017-07-12 ENCOUNTER — Encounter: Payer: Self-pay | Admitting: Podiatry

## 2017-07-12 DIAGNOSIS — I739 Peripheral vascular disease, unspecified: Secondary | ICD-10-CM

## 2017-07-12 DIAGNOSIS — B351 Tinea unguium: Secondary | ICD-10-CM

## 2017-07-12 DIAGNOSIS — M79675 Pain in left toe(s): Secondary | ICD-10-CM | POA: Diagnosis not present

## 2017-07-12 NOTE — Progress Notes (Signed)
Patient ID: Alexander Rice, male   DOB: [birth year], 83 y.o.   MRN: [id number] HPI  Complaint:  Visit Type: Patient returns to my office for continued preventative foot care services. Complaint: Patient states" my nails have grown long and thick and become painful to walk and wear shoes. He presents for preventative foot care services. No changes to ROS  Podiatric Exam: Vascular: dorsalis pedis and posterior tibial pulses are negative. Capillary return is diminished.. Temperature gradient is negative. Skin turgor WNL,   Sensorium: Normal Semmes Weinstein monofilament test. Normal tactile sensation bilaterally.  Nail Exam: Pt has thick disfigured discolored nails with subungual debris noted bilateral entire nail hallux  Ulcer Exam: There is no evidence of ulcer or pre-ulcerative changes or infection. Orthopedic Exam: Muscle tone and strength are WNL. No limitations in general ROM. No crepitus or effusions noted. Foot type and digits show no abnormalities. Bony prominences are unremarkable. Skin: No Porokeratosis. No infection or ulcers  Diagnosis:  Tinea unguium, Pain in right toe, pain in left toes  Treatment & Plan Procedures and Treatment: Consent by patient was obtained for treatment procedures. The patient understood the discussion of treatment and procedures well. All questions were answered thoroughly reviewed. Debridement of mycotic and hypertrophic toenails, 1 through 5 bilateral and clearing of subungual debris. No ulceration, no infection noted.  Return Visit-Office Procedure: Patient instructed to return to the office for a follow up visit 3 months for continued evaluation and treatment.   Gardiner Barefoot DPM

## 2017-07-12 NOTE — Telephone Encounter (Addendum)
Spoke with daughter who wanted to speak with Roselyn Reef and I did inform her that she was not in the office today and she stated she is fine with speaking with her if she comes in on Monday. She states Roselyn Reef was working on Copy for [employer].

## 2017-07-17 NOTE — Telephone Encounter (Signed)
Alexander Rice pts daughter is calling you back

## 2017-07-19 ENCOUNTER — Other Ambulatory Visit: Payer: Self-pay

## 2017-07-19 DIAGNOSIS — M48061 Spinal stenosis, lumbar region without neurogenic claudication: Secondary | ICD-10-CM

## 2017-07-23 ENCOUNTER — Telehealth: Payer: Self-pay | Admitting: Neurology

## 2017-07-23 NOTE — Telephone Encounter (Signed)
Physical Therapist calling to get verbal orders to start therapy next Monday 07/29/17. The daughter requested next week.  Thanks

## 2017-07-24 ENCOUNTER — Other Ambulatory Visit: Payer: Self-pay

## 2017-07-24 DIAGNOSIS — G20 Parkinson's disease: Secondary | ICD-10-CM

## 2017-07-24 NOTE — Telephone Encounter (Signed)
I think he is eligible for Hospice, pls send referral to [facility] (phone [phone]), they gave a list of eligibility criteria, and it appears he is eligible. Thanks

## 2017-07-24 NOTE — Telephone Encounter (Signed)
[name] called and asked for a call back to let Dr Delice Lesch know what she needs in regards to pt

## 2017-07-24 NOTE — Telephone Encounter (Signed)
Spoke with pt's daughter, Alexander Rice, who states that pt starting to get pressure sores from not being able to change positions during the night while asleep.  He is also having skin break-down now - wearing adult diapers, wife says that is one thing she cannot do is change his diaper.  Alexander Rice is wondering if we can place an order to Hospice, not for end of life care, but for equipment, possible hospital bed, possible catheter, and other supplies.  Please advise.

## 2017-07-26 ENCOUNTER — Other Ambulatory Visit: Payer: Self-pay

## 2017-07-26 DIAGNOSIS — G20 Parkinson's disease: Secondary | ICD-10-CM

## 2017-07-26 NOTE — Telephone Encounter (Signed)
Spoke with daughter and she states that the [facility] come in 3 days a week and noticed that her father had the beginnings of a pressure sore. They are having Hospice come in right now on the days that [facility] does not come. She wants to cancel the PT referral as the family would prefer the help from Hospice. I will cancel the PT referral.

## 2017-07-26 NOTE — Telephone Encounter (Signed)
Alexander Rice (PT) with [facility] called regarding this patient. His Daughter called them to hold off for now, that she feels Hospice would be better for now. Please Advise. Thanks

## 2017-07-30 ENCOUNTER — Telehealth: Payer: Self-pay | Admitting: Neurology

## 2017-07-30 NOTE — Telephone Encounter (Signed)
Patient is going through PCP to get the in home health care order done for patient. She states that they are not ready for hospice at this time and our work order has expired so that is why they are going thru PCP

## 2017-08-06 ENCOUNTER — Ambulatory Visit: Payer: Medicare Other | Admitting: Internal Medicine

## 2017-08-08 ENCOUNTER — Ambulatory Visit: Payer: Medicare Other | Admitting: Family Medicine

## 2017-08-20 ENCOUNTER — Other Ambulatory Visit (HOSPITAL_BASED_OUTPATIENT_CLINIC_OR_DEPARTMENT_OTHER): Payer: Medicare Other

## 2017-08-20 ENCOUNTER — Ambulatory Visit (HOSPITAL_BASED_OUTPATIENT_CLINIC_OR_DEPARTMENT_OTHER): Payer: Medicare Other | Admitting: Family

## 2017-08-20 ENCOUNTER — Ambulatory Visit (HOSPITAL_BASED_OUTPATIENT_CLINIC_OR_DEPARTMENT_OTHER): Payer: Medicare Other

## 2017-08-20 VITALS — BP 120/48 | HR 73 | Temp 98.4°F | Resp 20

## 2017-08-20 DIAGNOSIS — D461 Refractory anemia with ring sideroblasts: Secondary | ICD-10-CM

## 2017-08-20 DIAGNOSIS — D469 Myelodysplastic syndrome, unspecified: Secondary | ICD-10-CM

## 2017-08-20 DIAGNOSIS — D510 Vitamin B12 deficiency anemia due to intrinsic factor deficiency: Secondary | ICD-10-CM | POA: Diagnosis not present

## 2017-08-20 DIAGNOSIS — M1990 Unspecified osteoarthritis, unspecified site: Secondary | ICD-10-CM

## 2017-08-20 DIAGNOSIS — D509 Iron deficiency anemia, unspecified: Secondary | ICD-10-CM

## 2017-08-20 LAB — CBC WITH DIFFERENTIAL (CANCER CENTER ONLY)
BASO#: 0 10*3/uL (ref 0.0–0.2)
BASO%: 0.2 % (ref 0.0–2.0)
EOS%: 7.3 % — ABNORMAL HIGH (ref 0.0–7.0)
Eosinophils Absolute: 0.4 10*3/uL (ref 0.0–0.5)
HEMATOCRIT: 24.4 % — AB (ref 38.7–49.9)
HGB: 8.3 g/dL — ABNORMAL LOW (ref 13.0–17.1)
LYMPH#: 1.5 10*3/uL (ref 0.9–3.3)
LYMPH%: 26.3 % (ref 14.0–48.0)
MCH: 33.7 pg — AB (ref 28.0–33.4)
MCHC: 34 g/dL (ref 32.0–35.9)
MCV: 99 fL — AB (ref 82–98)
MONO#: 0.5 10*3/uL (ref 0.1–0.9)
MONO%: 8.5 % (ref 0.0–13.0)
NEUT#: 3.3 10*3/uL (ref 1.5–6.5)
NEUT%: 57.7 % (ref 40.0–80.0)
PLATELETS: 600 10*3/uL — AB (ref 145–400)
RBC: 2.46 10*6/uL — ABNORMAL LOW (ref 4.20–5.70)
RDW: 19.9 % — AB (ref 11.1–15.7)
WBC: 5.8 10*3/uL (ref 4.0–10.0)

## 2017-08-20 LAB — CMP (CANCER CENTER ONLY)
ALK PHOS: 66 U/L (ref 26–84)
ALT: 16 U/L (ref 10–47)
AST: 28 U/L (ref 11–38)
Albumin: 3.8 g/dL (ref 3.3–5.5)
BUN: 20 mg/dL (ref 7–22)
CALCIUM: 9 mg/dL (ref 8.0–10.3)
CHLORIDE: 104 meq/L (ref 98–108)
CO2: 27 meq/L (ref 18–33)
Creat: 1.1 mg/dl (ref 0.6–1.2)
GLUCOSE: 97 mg/dL (ref 73–118)
POTASSIUM: 4.5 meq/L (ref 3.3–4.7)
Sodium: 136 mEq/L (ref 128–145)
Total Bilirubin: 1.1 mg/dl (ref 0.20–1.60)
Total Protein: 6.1 g/dL — ABNORMAL LOW (ref 6.4–8.1)

## 2017-08-20 LAB — IRON AND TIBC
%SAT: 88 % — ABNORMAL HIGH (ref 20–55)
Iron: 172 ug/dL — ABNORMAL HIGH (ref 42–163)
TIBC: 195 ug/dL — AB (ref 202–409)
UIBC: 23 ug/dL — AB (ref 117–376)

## 2017-08-20 LAB — FERRITIN

## 2017-08-20 MED ORDER — EPOETIN ALFA 40000 UNIT/ML IJ SOLN
40000.0000 [IU] | Freq: Once | INTRAMUSCULAR | Status: AC
  Administered 2017-08-20: 40000 [IU] via SUBCUTANEOUS

## 2017-08-20 MED ORDER — EPOETIN ALFA 40000 UNIT/ML IJ SOLN
INTRAMUSCULAR | Status: AC
  Filled 2017-08-20: qty 1

## 2017-08-20 NOTE — Progress Notes (Signed)
Hematology and Oncology Follow Up Visit  Alexander Rice [id number] [birth year] 83 y.o. 08/20/2017   Principle Diagnosis:  Refractory anemia with ringed sideroblasts. Pernicious anemia Severe osteoarthritis  Current Therapy:   Procrit 40,000 units subcutaneous every 3 weeks Vitamin B6 250 mg by mouth daily Vitamin B-12 1 mg IM every month - done at home Blood transfusions as indicated-last transfusion on 07/31/2016   Interim History:  Alexander Rice is here today with his sweet family for follow-up and injection. His dementia and Parkinson's continue to progress. He is in a wheelchair today. His family is hoping to get home health involved to help them. They are hoping that he will be able to have his labs checked by a home health nurse and send [place] the results and then have his injection administered by that nurse at home. It is becoming hard for them to get him here.  He is pleasant confused and has no complaints at this time.  No fever, chills, n/v, cough, rash, dizziness, chest pain, palpitations, abdominal pain or changes in bowel or bladder habits. He is incontinent of urine and sometimes stool.  No tendenress, numbness or tingling in his extremities. He has some mild "puffiness" in his feet and ankles and is wearing his compression stockings.  He continues to have a good appetite and is staying well hydrated. He was unable to stand for a weight today.   ECOG Performance Status: 3 - Symptomatic, >50% confined to bed  Medications:  Allergies as of 08/20/2017   No Known Allergies     Medication List       Accurate as of 08/20/17 12:27 PM. Always use your most recent med list.          aspirin 81 MG tablet Take 81 mg by mouth daily.   carbidopa-levodopa 25-100 MG tablet Commonly known as:  SINEMET IR Take 1/2 tablet three times a day   CVS OMEGA-3 KRILL OIL PO Take 350 mg by mouth daily. Reported on 01/16/2016   [facility]S PROBIOTIC Chew Chew by mouth daily.     cyanocobalamin 100 MCG tablet Take by mouth daily.   dorzolamide 2 % ophthalmic solution Commonly known as:  TRUSOPT Place 1 drop into both eyes 2 (two) times daily.   doxazosin 4 MG tablet Commonly known as:  CARDURA TAKE 1 TABLET BY MOUTH AT  BEDTIME   lisinopril 5 MG tablet Commonly known as:  PRINIVIL,ZESTRIL TAKE 1 TABLET BY MOUTH  DAILY   LUMIGAN 0.01 % Soln Generic drug:  bimatoprost Place 1 drop into both eyes at bedtime.   memantine 10 MG tablet Commonly known as:  NAMENDA TAKE 1 TABLET BY MOUTH TWO  TIMES DAILY   multivitamin tablet Take 1 tablet by mouth daily.   omeprazole 20 MG capsule Commonly known as:  PRILOSEC TAKE 1 CAPSULE BY MOUTH  DAILY   simvastatin 40 MG tablet Commonly known as:  ZOCOR TAKE 1 TABLET BY MOUTH  DAILY AT 6 PM   vitamin B-6 250 MG tablet Take 1 tablet (250 mg total) by mouth daily.       Allergies: No Known Allergies  Past Medical History, Surgical history, Social history, and Family History were reviewed and updated.  Review of Systems: All other 10 point review of systems is negative.   Physical Exam:  vitals were not taken for this visit.  Wt Readings from Last 3 Encounters:  07/08/17 170 lb (77.1 kg)  06/17/17 169 lb (76.7 kg)  05/08/17 168 lb (76.2  kg)    Ocular: Sclerae unicteric, pupils equal, round and reactive to light Ear-nose-throat: Oropharynx clear, dentition fair Lymphatic: No cervical, supraclavicular or axillary adenopathy Lungs no rales or rhonchi, good excursion bilaterally Heart regular rate and rhythm, no murmur appreciated Abd soft, nontender, positive bowel sounds, no liver or spleen tip palpated on exam, no fluid wave  MSK no focal spinal tenderness, significant kyphosis, no joint edema Neuro: non-focal, pleasantly confused, appropriate affect Breasts: Deferred   Lab Results  Component Value Date   WBC 5.8 08/20/2017   HGB 8.3 (L) 08/20/2017   HCT 24.4 (L) 08/20/2017   MCV 99 (H)  08/20/2017   PLT 600 (H) 08/20/2017   Lab Results  Component Value Date   FERRITIN 803 (H) 07/08/2017   IRON 95 07/08/2017   TIBC 196 (L) 07/08/2017   UIBC 102 (L) 07/08/2017   IRONPCTSAT 48 07/08/2017   Lab Results  Component Value Date   RETICCTPCT 1.0 11/25/2015   RBC 2.46 (L) 08/20/2017   RETICCTABS 27.2 11/25/2015   Lab Results  Component Value Date   KPAFRELGTCHN 3.09 (H) 04/22/2008   LAMBDASER 1.01 04/22/2008   KAPLAMBRATIO 3.06 (H) 04/22/2008   No results found for: Osborne Casco Lab Results  Component Value Date   TOTALPROTELP 6.3 04/22/2008   ALBUMINELP 66.3 (H) 04/22/2008   A1GS 3.9 04/22/2008   A2GS 8.1 04/22/2008   BETS 5.2 04/22/2008   BETA2SER 4.0 04/22/2008   GAMS 12.5 04/22/2008   MSPIKE NOT DET 04/22/2008   SPEI * 04/22/2008     Chemistry      Component Value Date/Time   NA 136 08/20/2017 1150   NA 131 (L) 07/30/2016 1039   K 4.5 08/20/2017 1150   K 4.4 07/30/2016 1039   CL 104 08/20/2017 1150   CO2 27 08/20/2017 1150   CO2 21 (L) 07/30/2016 1039   BUN 20 08/20/2017 1150   BUN 20.3 07/30/2016 1039   CREATININE 1.1 08/20/2017 1150   CREATININE 0.9 07/30/2016 1039      Component Value Date/Time   CALCIUM 9.0 08/20/2017 1150   CALCIUM 8.7 07/30/2016 1039   ALKPHOS 66 08/20/2017 1150   ALKPHOS 47 07/30/2016 1039   AST 28 08/20/2017 1150   AST 17 07/30/2016 1039   ALT 16 08/20/2017 1150   ALT 18 07/30/2016 1039   BILITOT 1.10 08/20/2017 1150   BILITOT 0.93 07/30/2016 1039      Impression and Plan: Alexander Rice is a very pleasant 83 yo caucasian gentleman with myleodysplasia and refractory anemia with ringed sideroblasts.  His Hgb today is 8.3 so we will give him Procrit.  His wife and daughter would like to make any further appointments with our office until they have established home health. They will call us and let us know once this is set up.  We can certainly see him any time he needs [place].   Eliezer Bottom,  NP 10/2/201812:27 PM

## 2017-08-21 LAB — RETICULOCYTES: Reticulocyte Count: 0.9 % (ref 0.6–2.6)

## 2017-08-27 ENCOUNTER — Encounter: Payer: Self-pay | Admitting: Family Medicine

## 2017-08-27 ENCOUNTER — Ambulatory Visit (INDEPENDENT_AMBULATORY_CARE_PROVIDER_SITE_OTHER): Payer: Medicare Other | Admitting: Family Medicine

## 2017-08-27 VITALS — BP 136/74 | HR 61 | Temp 98.1°F

## 2017-08-27 DIAGNOSIS — R509 Fever, unspecified: Secondary | ICD-10-CM | POA: Diagnosis not present

## 2017-08-27 DIAGNOSIS — R531 Weakness: Secondary | ICD-10-CM | POA: Diagnosis not present

## 2017-08-27 DIAGNOSIS — I25119 Atherosclerotic heart disease of native coronary artery with unspecified angina pectoris: Secondary | ICD-10-CM

## 2017-08-27 DIAGNOSIS — D469 Myelodysplastic syndrome, unspecified: Secondary | ICD-10-CM

## 2017-08-27 DIAGNOSIS — F0390 Unspecified dementia without behavioral disturbance: Secondary | ICD-10-CM | POA: Diagnosis not present

## 2017-08-27 DIAGNOSIS — F03B0 Unspecified dementia, moderate, without behavioral disturbance, psychotic disturbance, mood disturbance, and anxiety: Secondary | ICD-10-CM

## 2017-08-27 NOTE — Progress Notes (Signed)
Subjective:  Alexander Rice is a 83 y.o. year old very pleasant male patient who presents for/with See problem oriented charting ROS- level V caveat applies due to dementia   Past Medical History-  Patient Active Problem List   Diagnosis Date Noted  . Bradycardia 04/27/2016    Priority: High  . Unresponsiveness 04/27/2016    Priority: High  . Moderate dementia, without behavioral disturbance 03/31/2015    Priority: High  . MDS (myelodysplastic syndrome) ([place]) 05/14/2011    Priority: High  . CAD (coronary artery disease) 09/29/2006    Priority: High  . Hyperlipidemia 09/29/2006    Priority: Medium  . Essential hypertension 09/29/2006    Priority: Medium  . BPH (benign prostatic hyperplasia) 09/29/2006    Priority: Medium  . Spinal stenosis of lumbar region 03/31/2015    Priority: Low  . Pernicious anemia 03/11/2015    Priority: Low  . Glaucoma 12/01/2014    Priority: Low  . Pulmonary nodule 04/08/2012    Priority: Low  . ANEMIA, B12 DEFICIENCY 07/03/2007    Priority: Low  . GERD 09/29/2006    Priority: Low    Medications- reviewed and updated Current Outpatient Prescriptions  Medication Sig Dispense Refill  . aspirin 81 MG tablet Take 81 mg by mouth daily.      . carbidopa-levodopa (SINEMET IR) 25-100 MG tablet Take 1/2 tablet three times a day 135 tablet 3  . CVS OMEGA-3 KRILL OIL PO Take 350 mg by mouth daily. Reported on 01/16/2016    . cyanocobalamin 100 MCG tablet Take by mouth daily.    . dorzolamide (TRUSOPT) 2 % ophthalmic solution Place 1 drop into both eyes 2 (two) times daily.      [name] doxazosin (CARDURA) 4 MG tablet TAKE 1 TABLET BY MOUTH AT  BEDTIME 90 tablet 3  . lisinopril (PRINIVIL,ZESTRIL) 5 MG tablet TAKE 1 TABLET BY MOUTH  DAILY 90 tablet 3  . LUMIGAN 0.01 % SOLN Place 1 drop into both eyes at bedtime.     . memantine (NAMENDA) 10 MG tablet TAKE 1 TABLET BY MOUTH TWO  TIMES DAILY 180 tablet 3  . Multiple Vitamin (MULTIVITAMIN) tablet Take 1 tablet  by mouth daily.      [name] omeprazole (PRILOSEC) 20 MG capsule TAKE 1 CAPSULE BY MOUTH  DAILY 90 capsule 3  . Probiotic Product (CVS PROBIOTIC) CHEW Chew by mouth daily.    . Pyridoxine HCl (VITAMIN B-6) 250 MG tablet Take 1 tablet (250 mg total) by mouth daily. 90 tablet 3  . simvastatin (ZOCOR) 40 MG tablet TAKE 1 TABLET BY MOUTH  DAILY AT 6 PM 90 tablet 3   No current facility-administered medications for this visit.    Facility-Administered Medications Ordered in Other Visits  Medication Dose Route Frequency Provider Last Rate Last Dose  . darbepoetin (ARANESP) injection 300 mcg  300 mcg Subcutaneous Once [name], MD        Objective: BP 136/74   Pulse 61   Temp 98.1 F (36.7 C) (Oral)   SpO2 94%  Gen: NAD, resting comfortably CV: RRR no murmurs rubs or gallops Lungs: CTAB no crackles, wheeze, rhonchi Abdomen: soft/nontender Ext: no edema Skin: warm, dry, no rash Neuro: wheelchair bound, slumped over, rigidity noted, pleasantly confused  Assessment/Plan:  MDS (myelodysplastic syndrome) ([place]) - Plan: Ambulatory referral to Home Health  Moderate dementia, without behavioral disturbance - Plan: Ambulatory referral to Home Health  Generalized weakness - Plan: Ambulatory referral to Home Health S: Patient with  MDS, CAD, Parkinson's dementia. He has been having more difficulty with weakness- need medical transport to even get him into his visit. Missed last visit due to inability due to inability to get into car. Progressive issues. Making it harder to get to hematology or here.   Working with [facility] originally. Using [facility]- out of pocket. Had considered hospice but could not get procrit if he has that. He wants to switch back to [facility]. Last visit was a week ago. Originally interested in PT but if purpose is for home training for wife - family does not think that's practical- would want only direct services as she cannot assist with additional  caretaking. Also needs help with personal care.    A/P:They want to see if home health nurse can draw CBC and inject procrit/aranesp. Also interested in medication management. Spoke with [facility] who can arrange this if medication is already at the home at time of their arrival.   He needs home health evaluation anyway due to his decline specifically his generalized weakness. We referred to home health physical therapy as below. Extended counseling today about quality of life and options to keep him in the home. Daughter declines for now taking him off of Procrit/Aranesp but they will continue to consider it-this would allow transition to hospice.  "Please evaluate [facility] for admission to [facility].  Disciplines requested: Nursing, Physical Therapy, Occupational Therapy, Medical Social Work and [place] to provide: Strengthening Exercises, Evaluate and Other: will need CBC blood draws and administration of procrit every 3-6 weeks in coordination with Dr. Antonieta Pert office- hematology  Physician to follow patient's care (the person listed here will be responsible for signing ongoing orders): PCP  Requested Start of Care Date: Within 2-3 days  I certify that this patient is under my care and that I, or a Nurse Practitioner or Physician's Assistant working with me, had a face-to-face encounter that meets the physician face-to-face requirements with patient on 08/27/17. The encounter with the patient was in whole, or in part for the following medical condition(s) which is the primary reason for home health care (List medical condition). generalized weakness, MDS, Parkinsons, Dementia  Special Instructions:  Daughter Azreal Stthomas has HCPOA"  Future Appointments Date Time Provider [place]  10/16/2017 3:45 PM Gardiner Barefoot, DPM [facility]  10/25/2017 10:00 AM Yong Channel Brayton Mars, MD [facility] None  11/01/2017 3:00 PM Cameron Sprang,  MD [facility] None    Orders Placed This Encounter  Procedures  . Ambulatory referral to Home Health    Referral Priority:   Routine    Referral Type:   Home Health Care    Referral Reason:   Specialty Services Required    Referred to Provider:   [facility]    Requested Specialty:   [place]    Number of Visits Requested:   1   The duration of face-to-face time during this visit was greater than 40 minutes (3:30-4:11). Greater than 50% of this time was spent in counseling, explanation of diagnosis, planning of further management, and/or coordination of care including counseling about quality of life, home services, hospice options, palliative care options.    Return precautions advised.  [name], MD

## 2017-08-27 NOTE — Patient Instructions (Signed)
Home health through [facility] ordered. They told me they would be in contact tomorrow and likely see you on Thursday or Friday.

## 2017-08-30 ENCOUNTER — Telehealth: Payer: Self-pay | Admitting: Family Medicine

## 2017-08-30 NOTE — Telephone Encounter (Signed)
[facility] called in reference to needing to delay OT eval for patient until 09/02/17 due to weather. Please call and advise.

## 2017-08-30 NOTE — Telephone Encounter (Signed)
Called and spoke with Alexander Rice her that per Dr. Yong Channel delay would be fine.Nothing further needed at this time.

## 2017-09-03 ENCOUNTER — Telehealth: Payer: Self-pay | Admitting: Surgical

## 2017-09-03 DIAGNOSIS — D510 Vitamin B12 deficiency anemia due to intrinsic factor deficiency: Secondary | ICD-10-CM | POA: Diagnosis not present

## 2017-09-03 DIAGNOSIS — G3183 Dementia with Lewy bodies: Secondary | ICD-10-CM | POA: Diagnosis not present

## 2017-09-03 DIAGNOSIS — D469 Myelodysplastic syndrome, unspecified: Secondary | ICD-10-CM | POA: Diagnosis not present

## 2017-09-03 DIAGNOSIS — F0280 Dementia in other diseases classified elsewhere without behavioral disturbance: Secondary | ICD-10-CM | POA: Diagnosis not present

## 2017-09-03 DIAGNOSIS — I2510 Atherosclerotic heart disease of native coronary artery without angina pectoris: Secondary | ICD-10-CM | POA: Diagnosis not present

## 2017-09-03 DIAGNOSIS — M48061 Spinal stenosis, lumbar region without neurogenic claudication: Secondary | ICD-10-CM | POA: Diagnosis not present

## 2017-09-03 NOTE — Telephone Encounter (Signed)
Elizabeth at [facility] called for verbal orders for patient to have plan of care and speech therapy. Nurse reported that the patient is running a 102.8 temp unsure of how long. Nurse reported that lungs are clear and patient is in no pain. O2 stats are at 95% at room tempeture. Family told the nurse that the patient coughs a lot while eating and that he is more winded with ambulation. They have given him Ibuprofen 600 MG today. Per Roselyn Reef Dr. Ronney Lion nurse gave verbal for the orders and scheduled patient to come in 09/04/17 at 10:45 AM.

## 2017-09-04 ENCOUNTER — Other Ambulatory Visit: Payer: Self-pay | Admitting: *Deleted

## 2017-09-04 ENCOUNTER — Encounter: Payer: Self-pay | Admitting: Family Medicine

## 2017-09-04 ENCOUNTER — Ambulatory Visit (INDEPENDENT_AMBULATORY_CARE_PROVIDER_SITE_OTHER): Payer: Medicare Other

## 2017-09-04 ENCOUNTER — Emergency Department (HOSPITAL_COMMUNITY): Payer: Medicare Other

## 2017-09-04 ENCOUNTER — Telehealth: Payer: Self-pay | Admitting: *Deleted

## 2017-09-04 ENCOUNTER — Other Ambulatory Visit: Payer: Self-pay | Admitting: Family

## 2017-09-04 ENCOUNTER — Inpatient Hospital Stay (HOSPITAL_COMMUNITY)
Admission: EM | Admit: 2017-09-04 | Discharge: 2017-09-12 | DRG: 418 | Disposition: A | Discharge status: Home Health | Payer: Medicare Other | Attending: Internal Medicine | Admitting: Internal Medicine

## 2017-09-04 ENCOUNTER — Encounter (HOSPITAL_COMMUNITY): Payer: Self-pay | Admitting: Family Medicine

## 2017-09-04 ENCOUNTER — Ambulatory Visit (INDEPENDENT_AMBULATORY_CARE_PROVIDER_SITE_OTHER): Payer: Medicare Other | Admitting: Family Medicine

## 2017-09-04 VITALS — BP 110/58 | HR 68 | Temp 98.3°F

## 2017-09-04 DIAGNOSIS — R410 Disorientation, unspecified: Secondary | ICD-10-CM | POA: Diagnosis not present

## 2017-09-04 DIAGNOSIS — I10 Essential (primary) hypertension: Secondary | ICD-10-CM | POA: Diagnosis not present

## 2017-09-04 DIAGNOSIS — D469 Myelodysplastic syndrome, unspecified: Secondary | ICD-10-CM | POA: Diagnosis not present

## 2017-09-04 DIAGNOSIS — F0280 Dementia in other diseases classified elsewhere without behavioral disturbance: Secondary | ICD-10-CM | POA: Diagnosis present

## 2017-09-04 DIAGNOSIS — G309 Alzheimer's disease, unspecified: Secondary | ICD-10-CM | POA: Diagnosis present

## 2017-09-04 DIAGNOSIS — K810 Acute cholecystitis: Secondary | ICD-10-CM | POA: Diagnosis not present

## 2017-09-04 DIAGNOSIS — K5649 Other impaction of intestine: Secondary | ICD-10-CM | POA: Diagnosis not present

## 2017-09-04 DIAGNOSIS — J9811 Atelectasis: Secondary | ICD-10-CM | POA: Diagnosis not present

## 2017-09-04 DIAGNOSIS — R509 Fever, unspecified: Secondary | ICD-10-CM

## 2017-09-04 DIAGNOSIS — K8067 Calculus of gallbladder and bile duct with acute and chronic cholecystitis with obstruction: Secondary | ICD-10-CM | POA: Diagnosis present

## 2017-09-04 DIAGNOSIS — N179 Acute kidney failure, unspecified: Secondary | ICD-10-CM | POA: Diagnosis present

## 2017-09-04 DIAGNOSIS — R932 Abnormal findings on diagnostic imaging of liver and biliary tract: Secondary | ICD-10-CM | POA: Diagnosis not present

## 2017-09-04 DIAGNOSIS — K812 Acute cholecystitis with chronic cholecystitis: Secondary | ICD-10-CM | POA: Diagnosis not present

## 2017-09-04 DIAGNOSIS — F0390 Unspecified dementia without behavioral disturbance: Secondary | ICD-10-CM

## 2017-09-04 DIAGNOSIS — R17 Unspecified jaundice: Secondary | ICD-10-CM | POA: Diagnosis not present

## 2017-09-04 DIAGNOSIS — N400 Enlarged prostate without lower urinary tract symptoms: Secondary | ICD-10-CM | POA: Diagnosis present

## 2017-09-04 DIAGNOSIS — E871 Hypo-osmolality and hyponatremia: Secondary | ICD-10-CM | POA: Diagnosis not present

## 2017-09-04 DIAGNOSIS — E860 Dehydration: Secondary | ICD-10-CM | POA: Diagnosis present

## 2017-09-04 DIAGNOSIS — I739 Peripheral vascular disease, unspecified: Secondary | ICD-10-CM | POA: Diagnosis present

## 2017-09-04 DIAGNOSIS — R05 Cough: Secondary | ICD-10-CM

## 2017-09-04 DIAGNOSIS — K823 Fistula of gallbladder: Secondary | ICD-10-CM | POA: Diagnosis present

## 2017-09-04 DIAGNOSIS — G20 Parkinson's disease: Secondary | ICD-10-CM | POA: Diagnosis present

## 2017-09-04 DIAGNOSIS — R531 Weakness: Secondary | ICD-10-CM | POA: Diagnosis not present

## 2017-09-04 DIAGNOSIS — Z955 Presence of coronary angioplasty implant and graft: Secondary | ICD-10-CM

## 2017-09-04 DIAGNOSIS — R404 Transient alteration of awareness: Secondary | ICD-10-CM | POA: Diagnosis not present

## 2017-09-04 DIAGNOSIS — R338 Other retention of urine: Secondary | ICD-10-CM | POA: Diagnosis present

## 2017-09-04 DIAGNOSIS — I714 Abdominal aortic aneurysm, without rupture, unspecified: Secondary | ICD-10-CM | POA: Diagnosis present

## 2017-09-04 DIAGNOSIS — N401 Enlarged prostate with lower urinary tract symptoms: Secondary | ICD-10-CM | POA: Diagnosis not present

## 2017-09-04 DIAGNOSIS — E785 Hyperlipidemia, unspecified: Secondary | ICD-10-CM | POA: Diagnosis present

## 2017-09-04 DIAGNOSIS — D510 Vitamin B12 deficiency anemia due to intrinsic factor deficiency: Secondary | ICD-10-CM | POA: Diagnosis present

## 2017-09-04 DIAGNOSIS — I129 Hypertensive chronic kidney disease with stage 1 through stage 4 chronic kidney disease, or unspecified chronic kidney disease: Secondary | ICD-10-CM | POA: Diagnosis present

## 2017-09-04 DIAGNOSIS — K838 Other specified diseases of biliary tract: Secondary | ICD-10-CM | POA: Diagnosis not present

## 2017-09-04 DIAGNOSIS — R945 Abnormal results of liver function studies: Secondary | ICD-10-CM | POA: Diagnosis not present

## 2017-09-04 DIAGNOSIS — Z79899 Other long term (current) drug therapy: Secondary | ICD-10-CM

## 2017-09-04 DIAGNOSIS — N183 Chronic kidney disease, stage 3 unspecified: Secondary | ICD-10-CM | POA: Diagnosis present

## 2017-09-04 DIAGNOSIS — K8050 Calculus of bile duct without cholangitis or cholecystitis without obstruction: Secondary | ICD-10-CM | POA: Diagnosis not present

## 2017-09-04 DIAGNOSIS — H409 Unspecified glaucoma: Secondary | ICD-10-CM | POA: Diagnosis present

## 2017-09-04 DIAGNOSIS — Z87891 Personal history of nicotine dependence: Secondary | ICD-10-CM

## 2017-09-04 DIAGNOSIS — F03C0 Unspecified dementia, severe, without behavioral disturbance, psychotic disturbance, mood disturbance, and anxiety: Secondary | ICD-10-CM | POA: Diagnosis present

## 2017-09-04 DIAGNOSIS — I25119 Atherosclerotic heart disease of native coronary artery with unspecified angina pectoris: Secondary | ICD-10-CM | POA: Diagnosis not present

## 2017-09-04 DIAGNOSIS — D631 Anemia in chronic kidney disease: Secondary | ICD-10-CM | POA: Diagnosis present

## 2017-09-04 DIAGNOSIS — R059 Cough, unspecified: Secondary | ICD-10-CM

## 2017-09-04 DIAGNOSIS — A419 Sepsis, unspecified organism: Secondary | ICD-10-CM

## 2017-09-04 DIAGNOSIS — Z419 Encounter for procedure for purposes other than remedying health state, unspecified: Secondary | ICD-10-CM

## 2017-09-04 DIAGNOSIS — K219 Gastro-esophageal reflux disease without esophagitis: Secondary | ICD-10-CM | POA: Diagnosis present

## 2017-09-04 DIAGNOSIS — Z8673 Personal history of transient ischemic attack (TIA), and cerebral infarction without residual deficits: Secondary | ICD-10-CM

## 2017-09-04 DIAGNOSIS — W19XXXA Unspecified fall, initial encounter: Secondary | ICD-10-CM | POA: Insufficient documentation

## 2017-09-04 DIAGNOSIS — R4182 Altered mental status, unspecified: Secondary | ICD-10-CM | POA: Diagnosis not present

## 2017-09-04 DIAGNOSIS — K819 Cholecystitis, unspecified: Secondary | ICD-10-CM | POA: Diagnosis not present

## 2017-09-04 DIAGNOSIS — Z7982 Long term (current) use of aspirin: Secondary | ICD-10-CM

## 2017-09-04 DIAGNOSIS — K449 Diaphragmatic hernia without obstruction or gangrene: Secondary | ICD-10-CM | POA: Diagnosis present

## 2017-09-04 DIAGNOSIS — R350 Frequency of micturition: Secondary | ICD-10-CM

## 2017-09-04 DIAGNOSIS — K8010 Calculus of gallbladder with chronic cholecystitis without obstruction: Secondary | ICD-10-CM | POA: Diagnosis not present

## 2017-09-04 DIAGNOSIS — Z86010 Personal history of colonic polyps: Secondary | ICD-10-CM

## 2017-09-04 DIAGNOSIS — I2510 Atherosclerotic heart disease of native coronary artery without angina pectoris: Secondary | ICD-10-CM | POA: Diagnosis not present

## 2017-09-04 DIAGNOSIS — R7989 Other specified abnormal findings of blood chemistry: Secondary | ICD-10-CM

## 2017-09-04 DIAGNOSIS — K8020 Calculus of gallbladder without cholecystitis without obstruction: Secondary | ICD-10-CM | POA: Diagnosis not present

## 2017-09-04 DIAGNOSIS — D649 Anemia, unspecified: Secondary | ICD-10-CM | POA: Diagnosis not present

## 2017-09-04 DIAGNOSIS — K8040 Calculus of bile duct with cholecystitis, unspecified, without obstruction: Secondary | ICD-10-CM | POA: Diagnosis not present

## 2017-09-04 DIAGNOSIS — K316 Fistula of stomach and duodenum: Secondary | ICD-10-CM | POA: Diagnosis not present

## 2017-09-04 HISTORY — DX: Unspecified dementia, unspecified severity, without behavioral disturbance, psychotic disturbance, mood disturbance, and anxiety: F03.90

## 2017-09-04 LAB — CBC WITH DIFFERENTIAL/PLATELET
BASOS ABS: 0 10*3/uL (ref 0.0–0.1)
BASOS PCT: 0.1 % (ref 0.0–3.0)
BASOS PCT: 0 %
Basophils Absolute: 0 10*3/uL (ref 0.0–0.1)
EOS ABS: 0 10*3/uL (ref 0.0–0.7)
EOS PCT: 1 %
Eosinophils Absolute: 0.1 10*3/uL (ref 0.0–0.7)
Eosinophils Relative: 0.2 % (ref 0.0–5.0)
HCT: 20.5 % — CL (ref 39.0–52.0)
HEMATOCRIT: 14.5 % — AB (ref 39.0–52.0)
HEMOGLOBIN: 5.2 g/dL — AB (ref 13.0–17.0)
LYMPHS PCT: 5 %
Lymphocytes Relative: 3.9 % — ABNORMAL LOW (ref 12.0–46.0)
Lymphs Abs: 0.4 10*3/uL — ABNORMAL LOW (ref 0.7–4.0)
Lymphs Abs: 0.5 10*3/uL — ABNORMAL LOW (ref 0.7–4.0)
MCH: 34 pg (ref 26.0–34.0)
MCHC: 34.9 g/dL (ref 30.0–36.0)
MCHC: 35.9 g/dL (ref 30.0–36.0)
MCV: 101.4 fl — ABNORMAL HIGH (ref 78.0–100.0)
MCV: 94.8 fL (ref 78.0–100.0)
MONO ABS: 0.5 10*3/uL (ref 0.1–1.0)
MONOS PCT: 8 %
Monocytes Absolute: 0.8 10*3/uL (ref 0.1–1.0)
Monocytes Relative: 5.1 % (ref 3.0–12.0)
NEUTROS ABS: 8.3 10*3/uL — AB (ref 1.7–7.7)
Neutro Abs: 9 10*3/uL — ABNORMAL HIGH (ref 1.4–7.7)
Neutrophils Relative %: 90.7 % — ABNORMAL HIGH (ref 43.0–77.0)
Neutrophils Relative %: 86 %
PLATELETS: 308 10*3/uL (ref 150.0–400.0)
Platelets: 312 10*3/uL (ref 150–400)
RBC: 2.02 Mil/uL — ABNORMAL LOW (ref 4.22–5.81)
RBC: 1.53 MIL/uL — ABNORMAL LOW (ref 4.22–5.81)
RDW: 22.7 % — AB (ref 11.5–15.5)
RDW: 23 % — ABNORMAL HIGH (ref 11.5–15.5)
WBC: 10 10*3/uL (ref 4.0–10.5)
WBC: 9.7 10*3/uL (ref 4.0–10.5)

## 2017-09-04 LAB — URINALYSIS, ROUTINE W REFLEX MICROSCOPIC
BACTERIA UA: NONE SEEN
Glucose, UA: NEGATIVE mg/dL
Ketones, ur: NEGATIVE mg/dL
Leukocytes, UA: NEGATIVE
Nitrite: NEGATIVE
PROTEIN: NEGATIVE mg/dL
SPECIFIC GRAVITY, URINE: 1.02 (ref 1.005–1.030)
Squamous Epithelial / LPF: NONE SEEN
pH: 5 (ref 5.0–8.0)

## 2017-09-04 LAB — COMPREHENSIVE METABOLIC PANEL
ALBUMIN: 3.7 g/dL (ref 3.5–5.2)
ALT: 322 U/L — AB (ref 0–53)
AST: 249 U/L — AB (ref 0–37)
Alkaline Phosphatase: 213 U/L — ABNORMAL HIGH (ref 39–117)
BILIRUBIN TOTAL: 10 mg/dL — AB (ref 0.2–1.2)
BUN: 37 mg/dL — AB (ref 6–23)
CALCIUM: 8.4 mg/dL (ref 8.4–10.5)
CHLORIDE: 92 meq/L — AB (ref 96–112)
CO2: 21 mEq/L (ref 19–32)
CREATININE: 1.61 mg/dL — AB (ref 0.40–1.50)
GFR: 43.78 mL/min — ABNORMAL LOW (ref >60.00)
Glucose, Bld: 102 mg/dL — ABNORMAL HIGH (ref 70–99)
Potassium: 4.7 mEq/L (ref 3.5–5.1)
SODIUM: 122 meq/L — AB (ref 135–145)
TOTAL PROTEIN: 5.4 g/dL — AB (ref 6.0–8.3)

## 2017-09-04 LAB — RAPID URINE DRUG SCREEN, HOSP PERFORMED
AMPHETAMINES: NOT DETECTED
Barbiturates: NOT DETECTED
Benzodiazepines: NOT DETECTED
COCAINE: NOT DETECTED
OPIATES: NOT DETECTED
TETRAHYDROCANNABINOL: NOT DETECTED

## 2017-09-04 LAB — AMMONIA: Ammonia: 39 umol/L — ABNORMAL HIGH (ref 11–35)

## 2017-09-04 LAB — PREPARE RBC (CROSSMATCH)

## 2017-09-04 LAB — POCT INFLUENZA A/B
INFLUENZA A, POC: NEGATIVE
INFLUENZA A, POC: NEGATIVE
Influenza B, POC: NEGATIVE
Influenza B, POC: NEGATIVE

## 2017-09-04 LAB — PROTIME-INR
INR: 1.3 ratio — AB (ref 0.8–1.0)
PROTHROMBIN TIME: 13.9 s — AB (ref 9.6–13.1)

## 2017-09-04 LAB — I-STAT CG4 LACTIC ACID, ED: LACTIC ACID, VENOUS: 1.37 mmol/L (ref 0.5–1.9)

## 2017-09-04 LAB — POC OCCULT BLOOD, ED: FECAL OCCULT BLD: NEGATIVE

## 2017-09-04 LAB — ETHANOL: Alcohol, Ethyl (B): <10 mg/dL (ref <10)

## 2017-09-04 MED ORDER — LATANOPROST 0.005 % OP SOLN
1.0000 [drp] | Freq: Every day | OPHTHALMIC | Status: DC
  Administered 2017-09-05 – 2017-09-10 (×7): 1 [drp] via OPHTHALMIC
  Filled 2017-09-04: qty 2.5

## 2017-09-04 MED ORDER — IOPAMIDOL (ISOVUE-300) INJECTION 61%
INTRAVENOUS | Status: AC
  Administered 2017-09-04: 75 mL
  Filled 2017-09-04: qty 75

## 2017-09-04 MED ORDER — RISAQUAD PO CAPS
1.0000 | ORAL_CAPSULE | Freq: Every day | ORAL | Status: DC
  Administered 2017-09-05 – 2017-09-12 (×5): 1 via ORAL
  Filled 2017-09-04 (×6): qty 1

## 2017-09-04 MED ORDER — ACETAMINOPHEN 650 MG RE SUPP: 650.0000 mg | Freq: Four times a day (QID) | RECTAL | Status: DC | PRN

## 2017-09-04 MED ORDER — ADULT MULTIVITAMIN W/MINERALS CH
1.0000 | ORAL_TABLET | Freq: Every day | ORAL | Status: DC
  Administered 2017-09-05 – 2017-09-12 (×5): 1 via ORAL
  Filled 2017-09-04 (×6): qty 1

## 2017-09-04 MED ORDER — DORZOLAMIDE HCL 2 % OP SOLN
1.0000 [drp] | Freq: Two times a day (BID) | OPHTHALMIC | Status: DC
  Administered 2017-09-05 – 2017-09-12 (×14): 1 [drp] via OPHTHALMIC
  Filled 2017-09-04: qty 10

## 2017-09-04 MED ORDER — ONDANSETRON HCL 4 MG PO TABS
4.0000 mg | ORAL_TABLET | Freq: Four times a day (QID) | ORAL | Status: DC | PRN
Start: 2017-09-04 — End: 2017-09-12

## 2017-09-04 MED ORDER — SODIUM CHLORIDE 0.9 % IV BOLUS (SEPSIS)
500.0000 mL | Freq: Once | INTRAVENOUS | Status: AC
  Administered 2017-09-04: 500 mL via INTRAVENOUS

## 2017-09-04 MED ORDER — SIMVASTATIN 40 MG PO TABS: 40.0000 mg | ORAL_TABLET | Freq: Every day | ORAL | Status: DC

## 2017-09-04 MED ORDER — IBUPROFEN 200 MG PO TABS
200.0000 mg | ORAL_TABLET | Freq: Four times a day (QID) | ORAL | Status: DC | PRN
Start: 2017-09-04 — End: 2017-09-05

## 2017-09-04 MED ORDER — HYDRALAZINE HCL 20 MG/ML IJ SOLN
5.0000 mg | INTRAMUSCULAR | Status: DC | PRN
  Administered 2017-09-09 (×2): 5 mg via INTRAVENOUS
  Filled 2017-09-04 (×2): qty 1

## 2017-09-04 MED ORDER — SODIUM CHLORIDE 0.9 % IV BOLUS (SEPSIS): 1000.0000 mL | Freq: Once | INTRAVENOUS | Status: DC

## 2017-09-04 MED ORDER — DOXAZOSIN MESYLATE 2 MG PO TABS
4.0000 mg | ORAL_TABLET | Freq: Every day | ORAL | Status: DC
  Administered 2017-09-05 – 2017-09-11 (×6): 4 mg via ORAL
  Filled 2017-09-04 (×2): qty 2
  Filled 2017-09-04 (×2): qty 1
  Filled 2017-09-04 (×3): qty 2

## 2017-09-04 MED ORDER — PIPERACILLIN-TAZOBACTAM 3.375 G IVPB
3.3750 g | Freq: Three times a day (TID) | INTRAVENOUS | Status: DC
  Administered 2017-09-05 – 2017-09-10 (×17): 3.375 g via INTRAVENOUS
  Filled 2017-09-04 (×18): qty 50

## 2017-09-04 MED ORDER — CARBIDOPA-LEVODOPA 25-100 MG PO TABS
0.5000 | ORAL_TABLET | Freq: Three times a day (TID) | ORAL | Status: DC
  Administered 2017-09-05 – 2017-09-12 (×17): 0.5 via ORAL
  Filled 2017-09-04 (×17): qty 1

## 2017-09-04 MED ORDER — ACETAMINOPHEN 325 MG PO TABS: 650.0000 mg | ORAL_TABLET | Freq: Four times a day (QID) | ORAL | Status: DC | PRN

## 2017-09-04 MED ORDER — SODIUM CHLORIDE 0.9 % IV BOLUS (SEPSIS)
1000.0000 mL | Freq: Once | INTRAVENOUS | Status: AC
  Administered 2017-09-04: 1000 mL via INTRAVENOUS

## 2017-09-04 MED ORDER — MEMANTINE HCL 10 MG PO TABS
10.0000 mg | ORAL_TABLET | Freq: Two times a day (BID) | ORAL | Status: DC
  Administered 2017-09-05 – 2017-09-12 (×12): 10 mg via ORAL
  Filled 2017-09-04 (×12): qty 1

## 2017-09-04 MED ORDER — PIPERACILLIN-TAZOBACTAM 3.375 G IVPB 30 MIN
3.3750 g | Freq: Once | INTRAVENOUS | Status: AC
  Administered 2017-09-04: 3.375 g via INTRAVENOUS
  Filled 2017-09-04: qty 50

## 2017-09-04 MED ORDER — VITAMIN B-12 100 MCG PO TABS
100.0000 ug | ORAL_TABLET | Freq: Every day | ORAL | Status: DC
  Administered 2017-09-05 – 2017-09-12 (×5): 100 ug via ORAL
  Filled 2017-09-04 (×8): qty 1

## 2017-09-04 MED ORDER — EPOETIN ALFA 40000 UNIT/ML IJ SOLN: 40000.0000 [IU] | INTRAMUSCULAR | 3 refills | Status: DC

## 2017-09-04 MED ORDER — VITAMIN B-6 50 MG PO TABS
250.0000 mg | ORAL_TABLET | Freq: Every day | ORAL | Status: DC
  Administered 2017-09-05 – 2017-09-12 (×5): 250 mg via ORAL
  Filled 2017-09-04 (×8): qty 1

## 2017-09-04 MED ORDER — OMEGA-3-ACID ETHYL ESTERS 1 G PO CAPS
1000.0000 mg | ORAL_CAPSULE | Freq: Every day | ORAL | Status: DC
  Administered 2017-09-05: 1000 mg via ORAL
  Filled 2017-09-04: qty 1

## 2017-09-04 MED ORDER — PANTOPRAZOLE SODIUM 40 MG PO TBEC
40.0000 mg | DELAYED_RELEASE_TABLET | Freq: Every day | ORAL | Status: DC
  Administered 2017-09-05 – 2017-09-06 (×2): 40 mg via ORAL
  Filled 2017-09-04 (×2): qty 1

## 2017-09-04 MED ORDER — ASPIRIN EC 81 MG PO TBEC
81.0000 mg | DELAYED_RELEASE_TABLET | Freq: Every day | ORAL | Status: DC
  Administered 2017-09-05: 81 mg via ORAL
  Filled 2017-09-04: qty 1

## 2017-09-04 MED ORDER — ZOLPIDEM TARTRATE 5 MG PO TABS: 5.0000 mg | ORAL_TABLET | Freq: Every evening | ORAL | Status: DC | PRN

## 2017-09-04 MED ORDER — ACETAMINOPHEN 325 MG PO TABS
650.0000 mg | ORAL_TABLET | Freq: Once | ORAL | Status: AC
Start: 2017-09-04 — End: 2017-09-04
  Administered 2017-09-04: 650 mg via ORAL
  Filled 2017-09-04: qty 2

## 2017-09-04 MED ORDER — ONDANSETRON HCL 4 MG/2ML IJ SOLN
4.0000 mg | Freq: Four times a day (QID) | INTRAMUSCULAR | Status: DC | PRN
  Administered 2017-09-09: 4 mg via INTRAVENOUS
  Filled 2017-09-04: qty 2

## 2017-09-04 NOTE — ED Triage Notes (Signed)
Patient is from home and transported by [facility]. Patient fell this morning at 7:00, EMS evaluated and patient refused transport. Patient was seen at PCP today and informed HGB was 7.1. Patient has dementia with generalized weakness. PCP is requesting a blood transfusion.

## 2017-09-04 NOTE — Telephone Encounter (Signed)
Spoke to pts daughter and explained Dr Alcario Drought concern regarding pts blood work and her suggestion that pt be taken to the Emergency room. Pts daughter states understanding and will call the medical transport team they use and if they are unable to supply transportation then she will call an ambulance.  Imbery charge nurse and spoke to [employer] about pt.

## 2017-09-04 NOTE — ED Notes (Addendum)
Pt is in MRI  

## 2017-09-04 NOTE — Progress Notes (Signed)
A consult was received from an ED physician for Zosyn per pharmacy dosing.  The patient's profile has been reviewed for ht/wt/allergies/indication/available labs.   A one time order has been placed for Zosyn 3.375g.  Further antibiotics/pharmacy consults should be ordered by admitting physician if indicated.                       Thank you, Gretta Arab PharmD, BCPS Pager [phone] 09/04/2017 8:05 PM

## 2017-09-04 NOTE — ED Notes (Signed)
Call report to courtney [phone] at 10:35 pm

## 2017-09-04 NOTE — Telephone Encounter (Signed)
Pt was seen today.

## 2017-09-04 NOTE — Addendum Note (Signed)
Addended by: Lucianne Lei M on: 09/04/2017 12:26 PM   Modules accepted: Orders

## 2017-09-04 NOTE — H&P (Signed)
History and Physical    KAILASH HINZE GUR:[id number] DOB: 10-16-1932 DOA: 09/04/2017  Referring MD/NP/PA:   PCP: Marin Olp, MD   Patient coming from:  The patient is coming from home.  At baseline, pt is independent for most of ADL.   Chief Complaint: low hemoglobin, generalized weakness, fever  HPI: JOREN REHM is a 83 y.o. male with medical history significant of MDS, hypertension, hyperlipidemia, stroke CAD, s/p of stent placement, BPH, anemia, Parkinson's disease, CKD-3, who presents with low hemoglobin, generalized weakness and fever.  Per pt's daughter, pt pt felt weak in this AM. He had fever of 102. He slipped and slowly sit down on the floor when he was transferred to wheelchair. Her daughter strongly denied falling on the ground or any injury. He had one episode of coughing after eating food. Currently patient does not have any cough, CP or SOB. Patient denies nausea, vomiting, diarrhea or abdominal pain. Patient has incontinence, not sure if patient has any symptoms of UTI. He does not have unilateral , slurred speech or hearing loss. Pt was seen by PCP and was found to have hemoglobin 7.0. Pt has yellow skin.  ED Course: pt was found to have hemoglobin 5.2 (baseline hemoglobin 8.0), abnormal liver function with ALP 213, AST 249, and ALT 322 and total bilirubin 10, WBC 9.7, negative FOBT, alcohol level less than 10, lactic acid 1.37, INR 1.3, worsening renal function, sodium 122, temperature normal, no tachycardia, no tachypnea, chest x-ray negative. Patient is admitted to telemetry  Gen. Surgeon, Dr. Marlou Starks and GI, Dr. Silverio Decamp  were consulted.  CT abdomen/pelvis showed:  1. Left lower lobe atelectasis.  Extensive coronary calcification 2. Biliary dilatation. Soft tissue density within the common bile duct may represent gallstones or possibly tumor. In addition, there is gas within the gallbladder fundus. This may be related to recently passed gallstones or  emphysematous cholecystitis. However, the gallbladder does not appear to be acutely inflamed or thickened at this time. 3. 10 x 20 mm calcification inferior to the right lobe liver appears nonacute without surrounding edema however this was not present on the prior study 4. Atherosclerotic disease with abdominal aortic aneurysm 35 mm   Review of Systems:   General: has fevers, chills, no body weight gain, has poor appetite, has fatigue HEENT: no blurry vision, hearing changes or sore throat Respiratory: currently no dyspnea, coughing, wheezing CV: no chest pain, no palpitations GI: no nausea, vomiting, abdominal pain, diarrhea, constipation GU: no dysuria, burning on urination, increased urinary frequency, hematuria  Ext: no leg edema Neuro: no unilateral weakness, numbness, or tingling, no vision change or hearing loss Skin: no rash, no skin tear. Has yellow skin. MSK: No muscle spasm, no deformity, no limitation of range of movement in spin Heme: No easy bruising.  Travel history: No recent long distant travel.  Allergy: No Known Allergies  Past Medical History:  Diagnosis Date  . Adenomatous colon polyp 02/1996, 02/2011   TA polyp 02/2011  . Anemia in chronic renal disease 11/02/2011  . B12 deficiency   . BPH (benign prostatic hyperplasia)   . CAD (coronary artery disease)   . CVD (cardiovascular disease)   . Dementia   . Diverticulosis   . Esophageal stricture   . GERD (gastroesophageal reflux disease)   . Glaucoma   . Heart palpitations   . Hemorrhoids   . Hiatal hernia   . History of colonic polyps 09/29/2006   Polyps age 40, Dr. Fuller Plan stated no further  colonoscopy due to age. Confirmed with daughter given alzheimers, CAD history    . Hyperlipidemia   . Hypertension   . MDS (myelodysplastic syndrome) ([place])     Past Surgical History:  Procedure Laterality Date  . CAROTID ENDARTERECTOMY    . COLONOSCOPY    . GLAUCOMA SURGERY Bilateral 11/19/12   pt's report  .  INGUINAL HERNIA REPAIR     right side/ twice  . PTCA     stent  . QUADRICEPS REPAIR     muscle attachment  . TONSILLECTOMY      Social History:  reports that he quit smoking about 48 years ago. He has never used smokeless tobacco. He reports that he drinks about 4.2 oz of alcohol per week . He reports that he does not use drugs.  Family History:  Family History  Problem Relation Age of Onset  . COPD Mother   . Alcohol abuse Father      Prior to Admission medications   Medication Sig Start Date End Date Taking? Authorizing Provider  aspirin 81 MG tablet Take 81 mg by mouth daily.     Yes [provider]  carbidopa-levodopa (SINEMET IR) 25-100 MG tablet Take 1/2 tablet three times a day Patient taking differently: Take 0.5 tablets by mouth 3 (three) times daily. Take 1/2 tablet three times a day 07/08/17  Yes Cameron Sprang, MD  [facility] OMEGA-3 KRILL OIL PO Take 350 mg by mouth daily. Reported on 01/16/2016   Yes [provider]  cyanocobalamin 100 MCG tablet Take 100 mcg by mouth daily.    Yes [provider]  dorzolamide (TRUSOPT) 2 % ophthalmic solution Place 1 drop into both eyes 2 (two) times daily.     Yes [provider]  doxazosin (CARDURA) 4 MG tablet TAKE 1 TABLET BY MOUTH AT  BEDTIME 11/20/16  Yes Marin Olp, MD  lisinopril (PRINIVIL,ZESTRIL) 5 MG tablet TAKE 1 TABLET BY MOUTH  DAILY 05/14/17  Yes Marin Olp, MD  LUMIGAN 0.01 % SOLN Place 1 drop into both eyes at bedtime.  03/23/11  Yes [provider]  memantine (NAMENDA) 10 MG tablet TAKE 1 TABLET BY MOUTH TWO  TIMES DAILY 05/01/17  Yes Cameron Sprang, MD  Multiple Vitamin (MULTIVITAMIN) tablet Take 1 tablet by mouth daily.     Yes [provider]  omeprazole (PRILOSEC) 20 MG capsule TAKE 1 CAPSULE BY MOUTH  DAILY 03/26/17  Yes Marin Olp, MD  Probiotic Product ([employer] PROBIOTIC) CHEW Chew 1 tablet by mouth daily.    Yes [provider]  Pyridoxine HCl  (VITAMIN B-6) 250 MG tablet Take 1 tablet (250 mg total) by mouth daily. 05/17/15  Yes [name], MD  simvastatin (ZOCOR) 40 MG tablet TAKE 1 TABLET BY MOUTH  DAILY AT 6 PM 03/26/17  Yes Marin Olp, MD  epoetin alfa (PROCRIT) 19417 UNIT/ML injection Inject 1 mL (40,000 Units total) into the skin every 21 ( twenty-one) days. As needed for Hgb < 10g/dl 09/04/17   [place], NP    Physical Exam: Vitals:   09/04/17 2045 09/04/17 2107 09/04/17 2227 09/04/17 2248  BP: (!) 131/48 (!) 157/57 (!) 130/51 (!) 152/60  Pulse: 69 74 76 81  Resp: 19 16 16 18   Temp:    98.1 F (36.7 C)  TempSrc:    Oral  SpO2: 100% 100% 98% 99%  Weight:      Height:       General: Not  in acute distress HEENT:       Eyes: PERRL, EOMI, has scleral icterus.       ENT: No discharge from the ears and nose, no pharynx injection, no tonsillar enlargement.        Neck: No JVD, no bruit, no mass felt. Heme: No neck lymph node enlargement. Cardiac: S1/S2, RRR, No murmurs, No gallops or rubs. Respiratory:  No rales, wheezing, rhonchi or rubs. GI: Soft, nondistended, nontender, no rebound pain, no organomegaly, BS present. GU: No hematuria Ext: No pitting leg edema bilaterally. 2+DP/PT pulse bilaterally. Musculoskeletal: No joint deformities, No joint redness or warmth, no limitation of ROM in spin. Skin: No rashes. Has jaundice. Neuro: drowsy, but oriented X3, cranial nerves II-XII grossly intact, moves all extremities normally.  Psych: Patient is not psychotic, no suicidal or hemocidal ideation.  Labs on Admission: I have personally reviewed following labs and imaging studies  CBC:  Recent Labs Lab 09/04/17 1220 09/04/17 2010  WBC 10.0 9.7  NEUTROABS 9.0* 8.3*  HGB 7.1 Repeated and verified X2.* 5.2*  HCT 20.5 Repeated and verified X2.* 14.5*  MCV 101.4* 94.8  PLT 308.0 878   Basic Metabolic Panel:  Recent Labs Lab 09/04/17 1220  NA 122*  K 4.7  CL 92*  CO2 21  GLUCOSE 102*  BUN  37*  CREATININE 1.61*  CALCIUM 8.4   GFR: Estimated Creatinine Clearance: 35.4 mL/min (A) (by C-G formula based on SCr of 1.61 mg/dL (H)). Liver Function Tests:  Recent Labs Lab 09/04/17 1220  AST 249*  ALT 322*  ALKPHOS 213*  BILITOT 10.0*  PROT 5.4*  ALBUMIN 3.7   No results for input(s): LIPASE, AMYLASE in the last 168 hours.  Recent Labs Lab 09/04/17 1343  AMMONIA 39*   Coagulation Profile:  Recent Labs Lab 09/04/17 1220  INR 1.3*   Cardiac Enzymes: No results for input(s): CKTOTAL, CKMB, CKMBINDEX, TROPONINI in the last 168 hours. BNP (last 3 results) No results for input(s): PROBNP in the last 8760 hours. HbA1C: No results for input(s): HGBA1C in the last 72 hours. CBG: No results for input(s): GLUCAP in the last 168 hours. Lipid Profile: No results for input(s): CHOL, HDL, LDLCALC, TRIG, CHOLHDL, LDLDIRECT in the last 72 hours. Thyroid Function Tests: No results for input(s): TSH, T4TOTAL, FREET4, T3FREE, THYROIDAB in the last 72 hours. Anemia Panel: No results for input(s): VITAMINB12, FOLATE, FERRITIN, TIBC, IRON, RETICCTPCT in the last 72 hours. Urine analysis:    Component Value Date/Time   COLORURINE AMBER (A) 09/04/2017 2003   APPEARANCEUR CLEAR 09/04/2017 2003   LABSPEC 1.020 09/04/2017 2003   PHURINE 5.0 09/04/2017 2003   GLUCOSEU NEGATIVE 09/04/2017 2003   HGBUR SMALL (A) 09/04/2017 2003   HGBUR negative 10/11/2009 0819   BILIRUBINUR SMALL (A) 09/04/2017 2003   BILIRUBINUR n 04/18/2016 [place] 09/04/2017 2003   PROTEINUR NEGATIVE 09/04/2017 2003   UROBILINOGEN 1.0 04/18/2016 1152   UROBILINOGEN 0.2 10/11/2009 0819   NITRITE NEGATIVE 09/04/2017 2003   LEUKOCYTESUR NEGATIVE 09/04/2017 2003   Sepsis Labs: @LABRCNTIP (procalcitonin:4,lacticidven:4) )No results found for this or any previous visit (from the past 240 hour(s)).   Radiological Exams on Admission: Dg Chest 2 View  Result Date: 09/04/2017 CLINICAL DATA:   Cough and fevers with increased confusion EXAM: CHEST  2 VIEW COMPARISON:  05/29/2016 FINDINGS: Cardiac shadow remains enlarged. The thoracic aorta is tortuous. Calcifications are noted. Elevation of left hemidiaphragm is again seen. Mild interstitial changes are noted. Mild bibasilar atelectatic changes are  noted. No focal infiltrate is noted. IMPRESSION: Mild bibasilar atelectatic changes. Aortic Atherosclerosis (ICD10-170.0) Electronically Signed   By: Inez Catalina M.D.   On: 09/04/2017 15:22   Ct Abdomen Pelvis W Contrast  Result Date: 09/04/2017 CLINICAL DATA:  Abnormal LFT.  Jaundice EXAM: CT ABDOMEN AND PELVIS WITH CONTRAST TECHNIQUE: Multidetector CT imaging of the abdomen and pelvis was performed using the standard protocol following bolus administration of intravenous contrast. CONTRAST:  <See Chart> ISOVUE-300 IOPAMIDOL (ISOVUE-300) INJECTION 61% COMPARISON:  CT abdomen pelvis 04/01/2012 FINDINGS: Lower chest: Mild left lower lobe atelectasis. Extensive coronary calcification. Small left lower lobe lung nodule on the prior study difficult to see on today's study due to atelectasis Hepatobiliary: Biliary dilatation. Mild intrahepatic and extrahepatic biliary dilatation. Soft tissue density within the common bile duct distally may represent common bile duct stones. Gallbladder is contracted. There is gas in the fundus of the gallbladder. Gallbladder wall not significantly thickened. No focal liver mass. Pancreas: Negative Spleen: Multiple calcified granulomata.  Normal splenic size Adrenals/Urinary Tract: Multiple left renal cyst. No renal mass or obstruction. Urinary bladder normal Stomach/Bowel: Negative for bowel obstruction.  Negative for mass Vascular/Lymphatic: Extensive atherosclerotic calcification. Abdominal aortic aneurysm measuring up to 35 mm, measuring 32 mm previously. Reproductive: Mild prostate enlargement Other: 10 x 20 mm calcification medially inferior to the right lobe of the liver.  This was not present previously and appears separate from bowel. Possible fat necrosis. No surrounding inflammation to suggest acute process. Musculoskeletal: Scoliosis and extensive lumbar degenerative change. X stop device at L4-5. No acute skeletal abnormality. IMPRESSION: Left lower lobe atelectasis.  Extensive coronary calcification Biliary dilatation. Soft tissue density within the common bile duct may represent gallstones or possibly tumor. In addition, there is gas within the gallbladder fundus. This may be related to recently passed gallstones or emphysematous cholecystitis. However, the gallbladder does not appear to be acutely inflamed or thickened at this time. 10 x 20 mm calcification inferior to the right lobe liver appears nonacute without surrounding edema however this was not present on the prior study Atherosclerotic disease with abdominal aortic aneurysm 35 mm Recommend followup by ultrasound in 2 years. This recommendation follows ACR consensus guidelines: White Paper of the ACR Incidental Findings Committee II on Vascular Findings. J Am Coll Radiol 2013; 10:789-794. Electronically Signed   By: Franchot Gallo M.D.   On: 09/04/2017 19:16     EKG: Independently reviewed. QTC 471, early R-wave progressi   Assessment/Plan Principal Problem:   Cholecystitis Active Problems:   Hyperlipidemia   Essential hypertension   CAD (coronary artery disease)   GERD   BPH (benign prostatic hyperplasia)   MDS (myelodysplastic syndrome) ([facility])   Pernicious anemia   Hyponatremia   Moderate dementia, without behavioral disturbance   Acute renal failure superimposed on stage 3 chronic kidney disease ([facility])   AAA (abdominal aortic aneurysm) ([facility])   Possible cholecystitis and cholelithiasis with abnormal liver liver function: EDP consulted Gen. Surgeon Dr. Marlou Starks, "he believes patient will need ERCP, and requests consult from GI and admit to medicine". EDP then consulted GI, Dr. Silverio Decamp, who  recommended MRCP. "If there is obstruction, patient will get ERCP tomorrow.  If no obstruction, recommends surgery consult".  -will admit to tele bed as inpt -start IV zosyn -Blood culture -NPO -MRCP -IVF: 2L of NS bolus in ED, followed by 125 cc/h   Hyperlipidemia -hold zocor due to abnormal LFT  HTN: -hold lisinopril due to worsening renal function -IV hydralazine when necessary  CAD (coronary  artery disease): s/p of stent. No CP -continue ASA  GERD: -Protonix  BPH: stable - Continue Cardura  MDS, pernicious anemiaand and severe anemia: hgb 5.2. FOBT negative -transfuse 3 units of blood -f/u with Dr. Niel Hummer -continue Cyanocobalamin  Hyponatremia: Na 122. Likely due to Dehydration and decreased oral int - Will check urine sodium, urine osmolality, serum osmolality. - check TSH - IVF: 2.5 L NS in ED, will continue with IV normal saline at 100 mL/h - f/u by BMP  Moderate dementia, without behavioral disturbance: -[place]  AoCKD-III: Baseline Cre is 1.2, pt's Cre is 1.65 on admission. Likely due to prerenal secondary to dehydration and continuation of ACEI. - IVF as above - Follow up renal function by BMP - Hold lisinopril  AAA (abdominal aortic aneurysm) ([place]): incidental finding of the CT scan, atherosclerotic disease with abdominal aortic aneurysm 35 mm. -f/u with PCP    DVT ppx: SCD Code Status: Full code Family Communication: Yes, patient's son and daughter  at bed side Disposition Plan:  Anticipate discharge back to previous home environment Consults called:  none Admission status: Inpatient/tele     Date of Service 09/04/2017    Ivor Costa [facility] Pager [phone]  If 7PM-7AM, please contact night-coverage [email or web address] Password [email or web address] 09/04/2017, 11:02 PM

## 2017-09-04 NOTE — Telephone Encounter (Signed)
CRITICAL VALUE STICKER  CRITICAL VALUE: Hgb 7.1 Hct 20.5  RECEIVER (on-site recipient of call): Mel Almond   DATE & TIME NOTIFIED: 09/04/17 2:50 pm  MESSENGER (representative from lab): Tamela Gammon  MD NOTIFIED: Dr Juleen China  TIME OF NOTIFICATION: 09/04/17 2:54  RESPONSE: Send pt to ED.

## 2017-09-04 NOTE — ED Notes (Signed)
Pt is back from MRI.

## 2017-09-04 NOTE — Consult Note (Signed)
Reason for Consult:jaundice Referring Physician: Dr. Rueben Bash Alexander Rice is an 83 y.o. male.  HPI: The patient is a 83 year old white male who has significant dementia. He is a poor historian. Family says he has not had any abdominal pain. They were concerned about his swallowing and a home health nurse came out to eval. They noticed today that he was yellow.   Past Medical History:  Diagnosis Date  . Adenomatous colon polyp 02/1996, 02/2011   TA polyp 02/2011  . Anemia in chronic renal disease 11/02/2011  . B12 deficiency   . BPH (benign prostatic hyperplasia)   . CAD (coronary artery disease)   . CVD (cardiovascular disease)   . Dementia   . Diverticulosis   . Esophageal stricture   . GERD (gastroesophageal reflux disease)   . Glaucoma   . Heart palpitations   . Hemorrhoids   . Hiatal hernia   . History of colonic polyps 09/29/2006   Polyps age 53, Dr. Fuller Plan stated no further colonoscopy due to age. Confirmed with daughter given alzheimers, CAD history    . Hyperlipidemia   . Hypertension   . MDS (myelodysplastic syndrome) ([place])     Past Surgical History:  Procedure Laterality Date  . CAROTID ENDARTERECTOMY    . COLONOSCOPY    . GLAUCOMA SURGERY Bilateral 11/19/12   pt's report  . INGUINAL HERNIA REPAIR     right side/ twice  . PTCA     stent  . QUADRICEPS REPAIR     muscle attachment  . TONSILLECTOMY      Family History  Problem Relation Age of Onset  . COPD Mother   . Alcohol abuse Father     Social History:  reports that he quit smoking about 48 years ago. He has never used smokeless tobacco. He reports that he drinks about 4.2 oz of alcohol per week . He reports that he does not use drugs.  Allergies: No Known Allergies  Medications: I have reviewed the patient's current medications.  Results for orders placed or performed during the hospital encounter of 09/04/17 (from the past 48 hour(s))  Urinalysis, Routine w reflex microscopic     Status:  Abnormal   Collection Time: 09/04/17  8:03 PM  Result Value Ref Range   Color, Urine AMBER (A) YELLOW    Comment: BIOCHEMICALS MAY BE AFFECTED BY COLOR   APPearance CLEAR CLEAR   Specific Gravity, Urine 1.020 1.005 - 1.030   pH 5.0 5.0 - 8.0   Glucose, UA NEGATIVE NEGATIVE mg/dL   Hgb urine dipstick SMALL (A) NEGATIVE   Bilirubin Urine SMALL (A) NEGATIVE   Ketones, ur NEGATIVE NEGATIVE mg/dL   Protein, ur NEGATIVE NEGATIVE mg/dL   Nitrite NEGATIVE NEGATIVE   Leukocytes, UA NEGATIVE NEGATIVE   RBC / HPF 0-5 0 - 5 RBC/hpf   WBC, UA 0-5 0 - 5 WBC/hpf   Bacteria, UA NONE SEEN NONE SEEN   Squamous Epithelial / LPF NONE SEEN NONE SEEN  Rapid urine drug screen (hospital performed)     Status: None   Collection Time: 09/04/17  8:03 PM  Result Value Ref Range   Opiates NONE DETECTED NONE DETECTED   Cocaine NONE DETECTED NONE DETECTED   Benzodiazepines NONE DETECTED NONE DETECTED   Amphetamines NONE DETECTED NONE DETECTED   Tetrahydrocannabinol NONE DETECTED NONE DETECTED   Barbiturates NONE DETECTED NONE DETECTED    Comment:        DRUG SCREEN FOR MEDICAL PURPOSES ONLY.  IF  CONFIRMATION IS NEEDED FOR ANY PURPOSE, NOTIFY LAB WITHIN 5 DAYS.        LOWEST DETECTABLE LIMITS FOR URINE DRUG SCREEN Drug Class       Cutoff (ng/mL) Amphetamine      1000 Barbiturate      200 Benzodiazepine   998 Tricyclics       338 Opiates          300 Cocaine          300 THC              50   Ethanol     Status: None   Collection Time: 09/04/17  8:10 PM  Result Value Ref Range   Alcohol, Ethyl (B) <10 <10 mg/dL    Comment:        LOWEST DETECTABLE LIMIT FOR SERUM ALCOHOL IS 10 mg/dL FOR MEDICAL PURPOSES ONLY   CBC with Differential     Status: Abnormal   Collection Time: 09/04/17  8:10 PM  Result Value Ref Range   WBC 9.7 4.0 - 10.5 K/uL   RBC 1.53 (L) 4.22 - 5.81 MIL/uL   Hemoglobin 5.2 (LL) 13.0 - 17.0 g/dL    Comment: REPEATED TO VERIFY CRITICAL RESULT CALLED TO, READ BACK BY AND  VERIFIED WITH: Cathe Mons [id number] @ 2053 BY J SCOTTON    HCT 14.5 (L) 39.0 - 52.0 %   MCV 94.8 78.0 - 100.0 fL   MCH 34.0 26.0 - 34.0 pg   MCHC 35.9 30.0 - 36.0 g/dL   RDW 23.0 (H) 11.5 - 15.5 %   Platelets 312 150 - 400 K/uL   Neutrophils Relative % 86 %   Lymphocytes Relative 5 %   Monocytes Relative 8 %   Eosinophils Relative 1 %   Basophils Relative 0 %   Neutro Abs 8.3 (H) 1.7 - 7.7 K/uL   Lymphs Abs 0.5 (L) 0.7 - 4.0 K/uL   Monocytes Absolute 0.8 0.1 - 1.0 K/uL   Eosinophils Absolute 0.1 0.0 - 0.7 K/uL   Basophils Absolute 0.0 0.0 - 0.1 K/uL   RBC Morphology SCHISTOCYTES NOTED ON SMEAR     Comment: ELLIPTOCYTES SPHEROCYTES   I-Stat CG4 Lactic Acid, ED  (not at  [facility])     Status: None   Collection Time: 09/04/17  8:19 PM  Result Value Ref Range   Lactic Acid, Venous 1.37 0.5 - 1.9 mmol/L  POC occult blood, ED RN will collect     Status: None   Collection Time: 09/04/17  9:08 PM  Result Value Ref Range   Fecal Occult Bld NEGATIVE NEGATIVE    Dg Chest 2 View  Result Date: 09/04/2017 CLINICAL DATA:  Cough and fevers with increased confusion EXAM: CHEST  2 VIEW COMPARISON:  05/29/2016 FINDINGS: Cardiac shadow remains enlarged. The thoracic aorta is tortuous. Calcifications are noted. Elevation of left hemidiaphragm is again seen. Mild interstitial changes are noted. Mild bibasilar atelectatic changes are noted. No focal infiltrate is noted. IMPRESSION: Mild bibasilar atelectatic changes. Aortic Atherosclerosis (ICD10-170.0) Electronically Signed   By: Inez Catalina M.D.   On: 09/04/2017 15:22   Ct Abdomen Pelvis W Contrast  Result Date: 09/04/2017 CLINICAL DATA:  Abnormal LFT.  Jaundice EXAM: CT ABDOMEN AND PELVIS WITH CONTRAST TECHNIQUE: Multidetector CT imaging of the abdomen and pelvis was performed using the standard protocol following bolus administration of intravenous contrast. CONTRAST:  <See Chart> ISOVUE-300 IOPAMIDOL (ISOVUE-300) INJECTION 61% COMPARISON:  CT  abdomen pelvis 04/01/2012 FINDINGS: Lower chest: Mild left  lower lobe atelectasis. Extensive coronary calcification. Small left lower lobe lung nodule on the prior study difficult to see on today's study due to atelectasis Hepatobiliary: Biliary dilatation. Mild intrahepatic and extrahepatic biliary dilatation. Soft tissue density within the common bile duct distally may represent common bile duct stones. Gallbladder is contracted. There is gas in the fundus of the gallbladder. Gallbladder wall not significantly thickened. No focal liver mass. Pancreas: Negative Spleen: Multiple calcified granulomata.  Normal splenic size Adrenals/Urinary Tract: Multiple left renal cyst. No renal mass or obstruction. Urinary bladder normal Stomach/Bowel: Negative for bowel obstruction.  Negative for mass Vascular/Lymphatic: Extensive atherosclerotic calcification. Abdominal aortic aneurysm measuring up to 35 mm, measuring 32 mm previously. Reproductive: Mild prostate enlargement Other: 10 x 20 mm calcification medially inferior to the right lobe of the liver. This was not present previously and appears separate from bowel. Possible fat necrosis. No surrounding inflammation to suggest acute process. Musculoskeletal: Scoliosis and extensive lumbar degenerative change. X stop device at L4-5. No acute skeletal abnormality. IMPRESSION: Left lower lobe atelectasis.  Extensive coronary calcification Biliary dilatation. Soft tissue density within the common bile duct may represent gallstones or possibly tumor. In addition, there is gas within the gallbladder fundus. This may be related to recently passed gallstones or emphysematous cholecystitis. However, the gallbladder does not appear to be acutely inflamed or thickened at this time. 10 x 20 mm calcification inferior to the right lobe liver appears nonacute without surrounding edema however this was not present on the prior study Atherosclerotic disease with abdominal aortic aneurysm 35  mm Recommend followup by ultrasound in 2 years. This recommendation follows ACR consensus guidelines: White Paper of the ACR Incidental Findings Committee II on Vascular Findings. J Am Coll Radiol 2013; 10:789-794. Electronically Signed   By: Franchot Gallo M.D.   On: 09/04/2017 19:16    Review of Systems  Constitutional: Positive for fever.  HENT: Negative.   Eyes: Negative.   Respiratory: Negative.   Cardiovascular: Negative.   Gastrointestinal: Positive for vomiting. Negative for abdominal pain.  Genitourinary: Negative.   Musculoskeletal: Negative.   Skin: Negative.   Neurological: Negative.   Endo/Heme/Allergies: Negative.   Psychiatric/Behavioral: Positive for memory loss.   Blood pressure (!) 157/57, pulse 74, temperature 98.9 F (37.2 C), temperature source Rectal, resp. rate 16, height 5\' 9"  (1.753 m), weight 76.7 kg (169 lb), SpO2 100 %. Physical Exam  Constitutional: He is oriented to person, place, and time. He appears well-developed and well-nourished. No distress.  HENT:  Head: Normocephalic and atraumatic.  Mouth/Throat: No oropharyngeal exudate.  Eyes: Pupils are equal, round, and reactive to light. Conjunctivae and EOM are normal.  Neck: Normal range of motion. Neck supple.  Cardiovascular: Normal rate, regular rhythm and normal heart sounds.   Respiratory: Breath sounds normal. No stridor. No respiratory distress.  GI: Soft. Bowel sounds are normal. There is no tenderness.  Musculoskeletal: Normal range of motion. He exhibits no edema or deformity.  Neurological: He is alert and oriented to person, place, and time.  parkinsons disease  Skin: Skin is warm and dry. No erythema.  Psychiatric: He has a normal mood and affect.  Dementia. Poor historian    Assessment/Plan: The patient appears to have a common duct stone with elevated lft's. Given his recent fever I think it would be reasonable to start abx. I would recommend medicine admit and GI consult for ERCP. He  will also need cardiac clearance. Will follow. Not clear if he truly has emphysematous cholecystits since there  is no inflammation around gallbladder and wbc is normal. Will follow  TOTH III,PAUL S 09/04/2017, 9:10 PM

## 2017-09-04 NOTE — Patient Instructions (Addendum)
Labs and x-ray before you leave  Ibuprofen is ok but I would stick to 400mg  every 6 hours  Early for influenza but I would also like for you to be swabbed for that with fever you had  Worsening symptoms- could always go to ER  Hold lisinopril

## 2017-09-04 NOTE — ED Provider Notes (Signed)
[facility] DEPT Provider Note   CSN: [id number] Arrival date & time: 09/04/17  1738     History   Chief Complaint Chief Complaint  Patient presents with  . Weakness    HPI Alexander Rice is a 83 y.o. male presenting with generalized weakness and abnormal labs.  Level 5 caveat, as patient has dementia.  History provided by daughter.  Patient has had worsening weakness over the past several days.  Yesterday, he had a fever of 102.  Daughter reports he has been coughing, especially while trying to eat.  This morning, patient became jaundiced.  He was evaluated by his PCP, and sent to the emergency room due to abnormal labs.  Hemoglobin of 7.1, baseline 8.  He has a history of anemia, requiring transfusion about once a year.  Patient has not been complaining of any other symptoms.  No obvious source of bleeding, daughter denies hematemesis or melena.  Patient does not have a history of liver abnormality.  He is not on blood thinners.  He slid from standing to the floor today while trying to get in his wheelchair, denies injury.  Medical history significant for myelodysplastic syndrome, severe dementia, Parkinson's.  HPI  Past Medical History:  Diagnosis Date  . Adenomatous colon polyp 02/1996, 02/2011   TA polyp 02/2011  . Anemia in chronic renal disease 11/02/2011  . B12 deficiency   . BPH (benign prostatic hyperplasia)   . CAD (coronary artery disease)   . CVD (cardiovascular disease)   . Dementia   . Diverticulosis   . Esophageal stricture   . GERD (gastroesophageal reflux disease)   . Glaucoma   . Heart palpitations   . Hemorrhoids   . Hiatal hernia   . History of colonic polyps 09/29/2006   Polyps age 51, Dr. Fuller Plan stated no further colonoscopy due to age. Confirmed with daughter given alzheimers, CAD history    . Hyperlipidemia   . Hypertension   . MDS (myelodysplastic syndrome) [facility])     Patient Active Problem List   Diagnosis  Date Noted  . Bradycardia 04/27/2016  . Unresponsiveness 04/27/2016  . Moderate dementia, without behavioral disturbance 03/31/2015  . Spinal stenosis of lumbar region 03/31/2015  . Pernicious anemia 03/11/2015  . Glaucoma 12/01/2014  . Pulmonary nodule 04/08/2012  . MDS (myelodysplastic syndrome) ([place]) 05/14/2011  . ANEMIA, B12 DEFICIENCY 07/03/2007  . Hyperlipidemia 09/29/2006  . Essential hypertension 09/29/2006  . CAD (coronary artery disease) 09/29/2006  . GERD 09/29/2006  . BPH (benign prostatic hyperplasia) 09/29/2006    Past Surgical History:  Procedure Laterality Date  . CAROTID ENDARTERECTOMY    . COLONOSCOPY    . GLAUCOMA SURGERY Bilateral 11/19/12   pt's report  . INGUINAL HERNIA REPAIR     right side/ twice  . PTCA     stent  . QUADRICEPS REPAIR     muscle attachment  . TONSILLECTOMY         Home Medications    Prior to Admission medications   Medication Sig Start Date End Date Taking? Authorizing Provider  aspirin 81 MG tablet Take 81 mg by mouth daily.     Yes [provider]  carbidopa-levodopa (SINEMET IR) 25-100 MG tablet Take 1/2 tablet three times a day Patient taking differently: Take 0.5 tablets by mouth 3 (three) times daily. Take 1/2 tablet three times a day 07/08/17  Yes Cameron Sprang, MD  [employer] OMEGA-3 KRILL OIL PO Take 350 mg by mouth daily. Reported on  01/16/2016   Yes [provider]  cyanocobalamin 100 MCG tablet Take 100 mcg by mouth daily.    Yes [provider]  dorzolamide (TRUSOPT) 2 % ophthalmic solution Place 1 drop into both eyes 2 (two) times daily.     Yes [provider]  doxazosin (CARDURA) 4 MG tablet TAKE 1 TABLET BY MOUTH AT  BEDTIME 11/20/16  Yes Marin Olp, MD  lisinopril (PRINIVIL,ZESTRIL) 5 MG tablet TAKE 1 TABLET BY MOUTH  DAILY 05/14/17  Yes Marin Olp, MD  LUMIGAN 0.01 % SOLN Place 1 drop into both eyes at bedtime.  03/23/11  Yes [provider]  memantine (NAMENDA) 10  MG tablet TAKE 1 TABLET BY MOUTH TWO  TIMES DAILY 05/01/17  Yes Cameron Sprang, MD  Multiple Vitamin (MULTIVITAMIN) tablet Take 1 tablet by mouth daily.     Yes [provider]  omeprazole (PRILOSEC) 20 MG capsule TAKE 1 CAPSULE BY MOUTH  DAILY 03/26/17  Yes Marin Olp, MD  Probiotic Product ([employer] PROBIOTIC) CHEW Chew 1 tablet by mouth daily.    Yes [provider]  Pyridoxine HCl (VITAMIN B-6) 250 MG tablet Take 1 tablet (250 mg total) by mouth daily. 05/17/15  Yes [name], MD  simvastatin (ZOCOR) 40 MG tablet TAKE 1 TABLET BY MOUTH  DAILY AT 6 PM 03/26/17  Yes Marin Olp, MD  epoetin alfa (PROCRIT) 67619 UNIT/ML injection Inject 1 mL (40,000 Units total) into the skin every 21 ( twenty-one) days. As needed for Hgb < 10g/dl 09/04/17   [place], Holli Humbles, NP    Family History Family History  Problem Relation Age of Onset  . COPD Mother   . Alcohol abuse Father     Social History Social History  Substance Use Topics  . Smoking status: Former Smoker    Quit date: 11/19/1968  . Smokeless tobacco: Never Used     Comment: never used tobacco  . Alcohol use 4.2 oz/week    7 Standard drinks or equivalent per week     Comment: Wine/Beer     Allergies   Patient has no known allergies.   Review of Systems Review of Systems  Unable to perform ROS: Dementia     Physical Exam Updated Vital Signs BP (!) 115/45   Pulse 63   SpO2 100%   Physical Exam  Constitutional: He appears well-developed and well-nourished. No distress.  HENT:  Head: Normocephalic and atraumatic.  Mouth/Throat: Mucous membranes are dry.  Eyes: Pupils are equal, round, and reactive to light. EOM are normal. Scleral icterus is present.  Neck: Normal range of motion.  Cardiovascular: Normal rate, regular rhythm and intact distal pulses.   Pulmonary/Chest: Effort normal and breath sounds normal. No respiratory distress. He has no wheezes. He has no rales. He exhibits no  tenderness.  Abdominal: Soft. Bowel sounds are normal. He exhibits no distension and no mass. There is no tenderness. There is no rebound and no guarding.  Musculoskeletal: Normal range of motion.  Moves all extremities on command.  Strength intact x4.  Sensation intact x4.  Neurological: He is alert. No cranial nerve deficit or sensory deficit. GCS eye subscore is 4. GCS verbal subscore is 5. GCS motor subscore is 6.  Patient mental status baseline per family.  No obvious neurologic deficits on exam.  Skin: Skin is warm and dry.  Patient is jaundiced  Psychiatric: He has a normal mood and affect.  Nursing note and vitals reviewed.  ED Treatments / Results  Labs (all labs ordered are listed, but only abnormal results are displayed) Labs Reviewed  URINALYSIS, ROUTINE W REFLEX MICROSCOPIC - Abnormal; Notable for the following:       Result Value   Color, Urine AMBER (*)    Hgb urine dipstick SMALL (*)    Bilirubin Urine SMALL (*)    All other components within normal limits  CBC WITH DIFFERENTIAL/PLATELET - Abnormal; Notable for the following:    RBC 1.53 (*)    Hemoglobin 5.2 (*)    HCT 14.5 (*)    RDW 23.0 (*)    Neutro Abs 8.3 (*)    Lymphs Abs 0.5 (*)    All other components within normal limits  BASIC METABOLIC PANEL - Abnormal; Notable for the following:    Sodium 123 (*)    Chloride 98 (*)    CO2 17 (*)    Glucose, Bld 112 (*)    BUN 33 (*)    Calcium 7.7 (*)    GFR calc non Af Amer 53 (*)    All other components within normal limits  CULTURE, BLOOD (ROUTINE X 2)  CULTURE, BLOOD (ROUTINE X 2)  RAPID URINE DRUG SCREEN, HOSP PERFORMED  ETHANOL  HEPATITIS PANEL, ACUTE  TSH  OSMOLALITY  OSMOLALITY, URINE  SODIUM, URINE, RANDOM  COMPREHENSIVE METABOLIC PANEL  CBC  POC OCCULT BLOOD, ED  I-STAT CG4 LACTIC ACID, ED  TYPE AND SCREEN  PREPARE RBC (CROSSMATCH)    EKG  EKG Interpretation None       Radiology Dg Chest 2 View  Result Date:  09/04/2017 CLINICAL DATA:  Cough and fevers with increased confusion EXAM: CHEST  2 VIEW COMPARISON:  05/29/2016 FINDINGS: Cardiac shadow remains enlarged. The thoracic aorta is tortuous. Calcifications are noted. Elevation of left hemidiaphragm is again seen. Mild interstitial changes are noted. Mild bibasilar atelectatic changes are noted. No focal infiltrate is noted. IMPRESSION: Mild bibasilar atelectatic changes. Aortic Atherosclerosis (ICD10-170.0) Electronically Signed   By: Inez Catalina M.D.   On: 09/04/2017 15:22   Ct Abdomen Pelvis W Contrast  Result Date: 09/04/2017 CLINICAL DATA:  Abnormal LFT.  Jaundice EXAM: CT ABDOMEN AND PELVIS WITH CONTRAST TECHNIQUE: Multidetector CT imaging of the abdomen and pelvis was performed using the standard protocol following bolus administration of intravenous contrast. CONTRAST:  <See Chart> ISOVUE-300 IOPAMIDOL (ISOVUE-300) INJECTION 61% COMPARISON:  CT abdomen pelvis 04/01/2012 FINDINGS: Lower chest: Mild left lower lobe atelectasis. Extensive coronary calcification. Small left lower lobe lung nodule on the prior study difficult to see on today's study due to atelectasis Hepatobiliary: Biliary dilatation. Mild intrahepatic and extrahepatic biliary dilatation. Soft tissue density within the common bile duct distally may represent common bile duct stones. Gallbladder is contracted. There is gas in the fundus of the gallbladder. Gallbladder wall not significantly thickened. No focal liver mass. Pancreas: Negative Spleen: Multiple calcified granulomata.  Normal splenic size Adrenals/Urinary Tract: Multiple left renal cyst. No renal mass or obstruction. Urinary bladder normal Stomach/Bowel: Negative for bowel obstruction.  Negative for mass Vascular/Lymphatic: Extensive atherosclerotic calcification. Abdominal aortic aneurysm measuring up to 35 mm, measuring 32 mm previously. Reproductive: Mild prostate enlargement Other: 10 x 20 mm calcification medially inferior to  the right lobe of the liver. This was not present previously and appears separate from bowel. Possible fat necrosis. No surrounding inflammation to suggest acute process. Musculoskeletal: Scoliosis and extensive lumbar degenerative change. X stop device at L4-5. No acute skeletal abnormality. IMPRESSION: Left lower lobe atelectasis.  Extensive  coronary calcification Biliary dilatation. Soft tissue density within the common bile duct may represent gallstones or possibly tumor. In addition, there is gas within the gallbladder fundus. This may be related to recently passed gallstones or emphysematous cholecystitis. However, the gallbladder does not appear to be acutely inflamed or thickened at this time. 10 x 20 mm calcification inferior to the right lobe liver appears nonacute without surrounding edema however this was not present on the prior study Atherosclerotic disease with abdominal aortic aneurysm 35 mm Recommend followup by ultrasound in 2 years. This recommendation follows ACR consensus guidelines: White Paper of the ACR Incidental Findings Committee II on Vascular Findings. J Am Coll Radiol 2013; 10:789-794. Electronically Signed   By: Franchot Gallo M.D.   On: 09/04/2017 19:16   Mr Abdomen Mrcp Wo Contrast  Result Date: 09/04/2017 CLINICAL DATA:  Elevated liver function studies. Gas in the gallbladder on CT. Common duct stones seen on CT. Patient has dementia and unable to hold breath. EXAM: MRI ABDOMEN WITHOUT CONTRAST  (INCLUDING MRCP) TECHNIQUE: Multiplanar multisequence MR imaging of the abdomen was performed. Heavily T2-weighted images of the biliary and pancreatic ducts were obtained, and three-dimensional MRCP images were rendered by post processing. COMPARISON:  CT abdomen and pelvis 09/04/2017 FINDINGS: Examination is technically limited due to motion artifact. Lower chest: There appear to be minimal bilateral pleural effusions. Hepatobiliary: No focal liver lesions are demonstrated. There is  diffuse low signal intensity throughout the liver particularly on T2 weighted imaging. This suggests iron deposition. Apparent signal loss in the liver on out of phase imaging suggesting diffuse fatty infiltration as well. Diffuse intra and extrahepatic bile duct dilatation. Multiple filling defects seen in the mid and distal common bile duct consistent with choledocholithiasis. Gallbladder is partially contracted. Limited visualization of the gallbladder due to technique but no apparent wall thickening. There is a filling defect within the gallbladder likely representing a gallstone. The intraluminal gas seen at CT is not appreciated on MRI. Pancreas: No focal pancreatic lesion or ductal dilatation identified. Spleen: Spleen size is normal. The spleen has a diffusely heterogeneous appearance with tiny nodular pattern demonstrated particularly on T2 weighted imaging. This may reflect changes due to iron overload or may indicate an infectious process. No specific focal lesions are identified. Adrenals/Urinary Tract: No adrenal gland nodules. Bilateral renal cysts. No hydronephrosis or hydroureter. Stomach/Bowel: Visualized portions within the abdomen are unremarkable. Vascular/Lymphatic: Infrarenal aortic ectasia with maximal AP diameter of about 3.4 cm. No significant lymphadenopathy. Other: No free intra-abdominal fluid is demonstrated. Mild edema in the dependent soft tissues. Musculoskeletal: Degenerative changes and scoliosis of the lumbar spine. Postoperative changes in the lower lumbar spine. Multiple areas of central canal stenosis are suggested, most severe at the L5-S1 level. IMPRESSION: 1. Cholelithiasis and choledocholithiasis with associated bile duct dilatation. 2. Diffuse low signal intensity throughout the liver consistent with iron deposition. Probable diffuse fatty infiltration as well. 3. Heterogeneous appearance of the spleen may be related iron deposition or could indicate infectious process. No  focal lesions. 4. Bilateral renal cysts. 5. Dilated abdominal aorta with maximal AP diameter 3.4 cm. Recommend followup by ultrasound in 3 years. This recommendation follows ACR consensus guidelines: White Paper of the ACR Incidental Findings Committee II on Vascular Findings. J Am Coll Radiol 2013; 44:818-563 6. Degenerative changes throughout the lumbar spine with areas of central canal stenosis, most prominent at the L5-S1 level. Electronically Signed   By: Lucienne Capers M.D.   On: 09/04/2017 23:06   Mr 3d Recon  At Scanner  Result Date: 09/04/2017 CLINICAL DATA:  Elevated liver function studies. Gas in the gallbladder on CT. Common duct stones seen on CT. Patient has dementia and unable to hold breath. EXAM: MRI ABDOMEN WITHOUT CONTRAST  (INCLUDING MRCP) TECHNIQUE: Multiplanar multisequence MR imaging of the abdomen was performed. Heavily T2-weighted images of the biliary and pancreatic ducts were obtained, and three-dimensional MRCP images were rendered by post processing. COMPARISON:  CT abdomen and pelvis 09/04/2017 FINDINGS: Examination is technically limited due to motion artifact. Lower chest: There appear to be minimal bilateral pleural effusions. Hepatobiliary: No focal liver lesions are demonstrated. There is diffuse low signal intensity throughout the liver particularly on T2 weighted imaging. This suggests iron deposition. Apparent signal loss in the liver on out of phase imaging suggesting diffuse fatty infiltration as well. Diffuse intra and extrahepatic bile duct dilatation. Multiple filling defects seen in the mid and distal common bile duct consistent with choledocholithiasis. Gallbladder is partially contracted. Limited visualization of the gallbladder due to technique but no apparent wall thickening. There is a filling defect within the gallbladder likely representing a gallstone. The intraluminal gas seen at CT is not appreciated on MRI. Pancreas: No focal pancreatic lesion or ductal  dilatation identified. Spleen: Spleen size is normal. The spleen has a diffusely heterogeneous appearance with tiny nodular pattern demonstrated particularly on T2 weighted imaging. This may reflect changes due to iron overload or may indicate an infectious process. No specific focal lesions are identified. Adrenals/Urinary Tract: No adrenal gland nodules. Bilateral renal cysts. No hydronephrosis or hydroureter. Stomach/Bowel: Visualized portions within the abdomen are unremarkable. Vascular/Lymphatic: Infrarenal aortic ectasia with maximal AP diameter of about 3.4 cm. No significant lymphadenopathy. Other: No free intra-abdominal fluid is demonstrated. Mild edema in the dependent soft tissues. Musculoskeletal: Degenerative changes and scoliosis of the lumbar spine. Postoperative changes in the lower lumbar spine. Multiple areas of central canal stenosis are suggested, most severe at the L5-S1 level. IMPRESSION: 1. Cholelithiasis and choledocholithiasis with associated bile duct dilatation. 2. Diffuse low signal intensity throughout the liver consistent with iron deposition. Probable diffuse fatty infiltration as well. 3. Heterogeneous appearance of the spleen may be related iron deposition or could indicate infectious process. No focal lesions. 4. Bilateral renal cysts. 5. Dilated abdominal aorta with maximal AP diameter 3.4 cm. Recommend followup by ultrasound in 3 years. This recommendation follows ACR consensus guidelines: White Paper of the ACR Incidental Findings Committee II on Vascular Findings. J Am Coll Radiol 2013; 16:073-710 6. Degenerative changes throughout the lumbar spine with areas of central canal stenosis, most prominent at the L5-S1 level. Electronically Signed   By: Lucienne Capers M.D.   On: 09/04/2017 23:06    Procedures .Critical Care Performed by: Franchot Heidelberg Authorized by: Franchot Heidelberg   Critical care provider statement:    Critical care time (minutes):  45   Critical  care time was exclusive of:  Separately billable procedures and treating other patients and teaching time   Critical care was necessary to treat or prevent imminent or life-threatening deterioration of the following conditions:  Circulatory failure and sepsis   Critical care was time spent personally by me on the following activities:  Development of treatment plan with patient or surrogate, discussions with consultants, evaluation of patient's response to treatment, examination of patient, obtaining history from patient or surrogate, review of old charts, re-evaluation of patient's condition, pulse oximetry, ordering and review of radiographic studies, ordering and review of laboratory studies and ordering and performing treatments and interventions  I assumed direction of critical care for this patient from another provider in my specialty: no     (including critical care time)  Medications Ordered in ED Medications  acetaminophen (TYLENOL) tablet 650 mg (not administered)  sodium chloride 0.9 % bolus 1,000 mL (not administered)    And  sodium chloride 0.9 % bolus 1,000 mL (not administered)    And  sodium chloride 0.9 % bolus 500 mL (not administered)  piperacillin-tazobactam (ZOSYN) IVPB 3.375 g (not administered)  iopamidol (ISOVUE-300) 61 % injection (75 mLs  Contrast Given 09/04/17 1849)     Initial Impression / Assessment and Plan / ED Course  I have reviewed the triage vital signs and the nursing notes.  Pertinent labs & imaging results that were available during my care of the patient were reviewed by me and considered in my medical decision making (see chart for details).     Patient presenting with several day history of weakness, coughing, and new onset jaundice.  PCP obtained labs, and sent patient to emergency room for further evaluation.  Physical exam shows pt is jaundiced, but is otherwise reassuring.  Vital signs show patient is stable, map 66.  Labs concerning, white  count doubled from 2 weeks ago at 10.  Hemoglobin decreased from 2 weeks ago at 7.1.  Sodium potassium low at 122 and 92 respectively.  His bilirubin elevated at 10, alk phos 213, AST 249, ALT 322.  Ammonia normal at 39.  Lactate dehydrogenase and haptoglobin pending.   Case discussed with attending, Dr. Ellender Hose evaluated the patient.  Will repeat CBC, order type and screen, and obtain Hemoccult. Will consult with surgery for possible need for cholecystectomy and plan to admit to medicine. Discussed with Dr. Marlou Starks from [facility].  He believes patient will need ERCP, and requests consult from GI and admit to medicine.  Discussed with [facility].  Patient to get stat MRCP.  If there is obstruction, patient will get ERCP tomorrow.  If no obstruction, recommends surgery consult.  Initial lactic acid negative.  CBC shows critically low hemoglobin of 5.2.  Will start emergent transfusion of 2 units of blood.  Sepsis protocol initiated, as patient is immunocompromised, blood pressure is soft, and CT with possible emphysematous cholecystitis.  2500 IVF and Zosyn started.    Will contact hospitalist for admission. Discussed with Dr. Blaine Hamper, pt to be admitted.    Final Clinical Impressions(s) / ED Diagnoses   Final diagnoses:  Sepsis, due to unspecified organism [facility])  Elevated LFTs  Symptomatic anemia  Hyponatremia    New Prescriptions New Prescriptions   No medications on file     Franchot Heidelberg, PA-C 09/05/17 Dessie Coma, MD 09/06/17 1520

## 2017-09-04 NOTE — Telephone Encounter (Signed)
Received request for lab and procrit orders from [place] at [facility]  Order for labs every three weeks - CBC, CMP, Iron & TIBC and Retic given.   They are unable to supply medication to patient, so they requested that Dr Marin Olp send in script for Procrit for home dispensing. They will then administer the medication as needed per order, for HGB <10  Verbal orders given. They will faxed written orders to be signed at another time.

## 2017-09-04 NOTE — ED Notes (Signed)
Unable to complete vitals assessment d/t pt off ED floor for diagnostic study. Will complete upon rtn. [employer]

## 2017-09-04 NOTE — Progress Notes (Signed)
Subjective:  Alexander Rice is a 83 y.o. year old very pleasant male patient who presents for/with See problem oriented charting ROS- level 5 caveat due to dementia. Family has noted increased confusion, weakness, fever up to 102.8.    Past Medical History-  Patient Active Problem List   Diagnosis Date Noted  . Bradycardia 04/27/2016    Priority: High  . Unresponsiveness 04/27/2016    Priority: High  . Moderate dementia, without behavioral disturbance 03/31/2015    Priority: High  . MDS (myelodysplastic syndrome) ([place]) 05/14/2011    Priority: High  . CAD (coronary artery disease) 09/29/2006    Priority: High  . Hyperlipidemia 09/29/2006    Priority: Medium  . Essential hypertension 09/29/2006    Priority: Medium  . BPH (benign prostatic hyperplasia) 09/29/2006    Priority: Medium  . Spinal stenosis of lumbar region 03/31/2015    Priority: Low  . Pernicious anemia 03/11/2015    Priority: Low  . Glaucoma 12/01/2014    Priority: Low  . Pulmonary nodule 04/08/2012    Priority: Low  . ANEMIA, B12 DEFICIENCY 07/03/2007    Priority: Low  . GERD 09/29/2006    Priority: Low    Medications- reviewed and updated Current Outpatient Prescriptions  Medication Sig Dispense Refill  . aspirin 81 MG tablet Take 81 mg by mouth daily.      . carbidopa-levodopa (SINEMET IR) 25-100 MG tablet Take 1/2 tablet three times a day 135 tablet 3  . [employer] OMEGA-3 KRILL OIL PO Take 350 mg by mouth daily. Reported on 01/16/2016    . cyanocobalamin 100 MCG tablet Take by mouth daily.    . dorzolamide (TRUSOPT) 2 % ophthalmic solution Place 1 drop into both eyes 2 (two) times daily.      [name] doxazosin (CARDURA) 4 MG tablet TAKE 1 TABLET BY MOUTH AT  BEDTIME 90 tablet 3  . epoetin alfa (PROCRIT) 38250 UNIT/ML injection Inject 1 mL (40,000 Units total) into the skin every 21 ( twenty-one) days. As needed for Hgb < 10g/dl 3 mL 3  . lisinopril (PRINIVIL,ZESTRIL) 5 MG tablet TAKE 1 TABLET BY MOUTH  DAILY 90  tablet 3  . LUMIGAN 0.01 % SOLN Place 1 drop into both eyes at bedtime.     . memantine (NAMENDA) 10 MG tablet TAKE 1 TABLET BY MOUTH TWO  TIMES DAILY 180 tablet 3  . Multiple Vitamin (MULTIVITAMIN) tablet Take 1 tablet by mouth daily.      [name] omeprazole (PRILOSEC) 20 MG capsule TAKE 1 CAPSULE BY MOUTH  DAILY 90 capsule 3  . Probiotic Product ([employer] PROBIOTIC) CHEW Chew by mouth daily.    . Pyridoxine HCl (VITAMIN B-6) 250 MG tablet Take 1 tablet (250 mg total) by mouth daily. 90 tablet 3  . simvastatin (ZOCOR) 40 MG tablet TAKE 1 TABLET BY MOUTH  DAILY AT 6 PM 90 tablet 3   No current facility-administered medications for this visit.    Facility-Administered Medications Ordered in Other Visits  Medication Dose Route Frequency Provider Last Rate Last Dose  . darbepoetin (ARANESP) injection 300 mcg  300 mcg Subcutaneous Once [name], MD        Objective: BP (!) 110/58 (BP Location: Left Arm, Patient Position: Sitting, Cuff Size: Normal)   Pulse 68   Temp 98.3 F (36.8 C) (Oral)   SpO2 94%  Gen: NAD, resting comfortably Clearly jaundiced CV: RRR no murmurs rubs or gallops Lungs: CTAB no crackles, wheeze, rhonchi- patient would not comply with  deep breaths Abdomen: soft/nontender/nondistended/normal bowel sounds. Unable to get onto table so could not get complete liver or spleen exam Ext: trace edema Skin: warm, dry Neuro: more confused, less talkative than even his baseline. Wheelchair bound  Assessment/Plan:  Fever, unspecified fever cause - Plan: DG Chest 2 View, POCT Influenza A/B  Cough - Plan: DG Chest 2 View  Confusion - Plan: Ammonia, CANCELED: Ammonia  Jaundice - Plan: Comprehensive metabolic panel, CBC with Differential/Platelet, Haptoglobin, Lactate Dehydrogenase, Protime-INR S: yesterday morning started coughing more frequently. At lunch- spit up fluid- they gave him smaller amount of food. Was more winded yesterday. Was too winded with home health to do many of  the ativities. Temperature to 102.8 - gave 3 ibuprofen- last night was around 100 and given 3 more. Gave ibuprofen this morning. Today, first time noted yellowing  Home health was worried about potential aspiration and asked for speech therapy eval- some concern about aspiration- was grabbing at throat after coughing. No increased urinationif anything slightly less. No burning with peeing-   Increased confusion, decreased activity over last several days.  A/P: 83 year old male with dementia and MDS presents with confusion, fever yesterday, increased fatigue and is noted to be jaundiced. ? Potential for aspiration so will get x-ray to evaluate aspiration pneumonia. Rapid flu negative.With jaundice - will get pit/inr, LFTs, cbc- potential hemolytic anemia, haptoglobin and LDH. Planned to get fractionated bilirubin but not ordered as intended.  Ammonia mildly elevated but doubt primary issue here.   Addendum: after visit was called by Dr. Juleen China who reported worsening anemia from baseline and given known jaundice- she had advised transport to the ED- thanked her for her assistance. Strongly suspect will need to be admitted.   HTN- Had advised to hold lisinopril for HTN before patient went home due to lower than normal BP.   High risk patient (age, MDS, dementia) with uncertain prognosis  Future Appointments Date Time Provider [place]  10/16/2017 3:45 PM Gardiner Barefoot, DPM [facility]  10/25/2017 10:00 AM Yong Channel Brayton Mars, MD [facility] None  11/01/2017 3:00 PM Cameron Sprang, MD [facility] None   No Follow-up on file.  Orders Placed This Encounter  Procedures  . DG Chest 2 View    Standing Status:   Future    Number of Occurrences:   1    Standing Expiration Date:   11/04/2018    Order Specific Question:   Reason for Exam (SYMPTOM  OR DIAGNOSIS REQUIRED)    Answer:   cough, fever, confusion    Order Specific Question:   Preferred imaging location?    Answer:   [place]  . Comprehensive metabolic panel    [place]  . CBC with Differential/Platelet  . Haptoglobin  . Lactate Dehydrogenase  . Protime-INR  . Ammonia  . POCT Influenza A/B   Return precautions advised.  [name], MD

## 2017-09-04 NOTE — Progress Notes (Signed)
Pharmacy Antibiotic Note  Alexander Rice is a 83 y.o. male admitted on 09/04/2017 with cholecystitis.  Pharmacy has been consulted for Zosyn dosing.  Plan: Zosyn 3.375g IV Q8H infused over 4hrs. Follow up renal fxn, culture results, and clinical course.  Height: 5\' 9"  (175.3 cm) Weight: 169 lb (76.7 kg) IBW/kg (Calculated) : 70.7  Temp (24hrs), Avg:98.6 F (37 C), Min:98.3 F (36.8 C), Max:98.9 F (37.2 C)   Recent Labs Lab 09/04/17 1220 09/04/17 2010 09/04/17 2019  WBC 10.0 9.7  --   CREATININE 1.61*  --   --   LATICACIDVEN  --   --  1.37    Estimated Creatinine Clearance: 35.4 mL/min (A) (by C-G formula based on SCr of 1.61 mg/dL (H)).    No Known Allergies   Thank you for allowing pharmacy to be a part of this patient's care.  Gretta Arab PharmD, BCPS Pager [phone] 09/04/2017 9:58 PM

## 2017-09-05 DIAGNOSIS — K8050 Calculus of bile duct without cholangitis or cholecystitis without obstruction: Secondary | ICD-10-CM

## 2017-09-05 DIAGNOSIS — D649 Anemia, unspecified: Secondary | ICD-10-CM

## 2017-09-05 DIAGNOSIS — K810 Acute cholecystitis: Secondary | ICD-10-CM

## 2017-09-05 DIAGNOSIS — K838 Other specified diseases of biliary tract: Secondary | ICD-10-CM

## 2017-09-05 LAB — HAPTOGLOBIN: Haptoglobin: 82 mg/dL (ref 43–212)

## 2017-09-05 LAB — COMPREHENSIVE METABOLIC PANEL
ALBUMIN: 2.8 g/dL — AB (ref 3.5–5.0)
ALT: 64 U/L — ABNORMAL HIGH (ref 17–63)
ANION GAP: 9 (ref 5–15)
AST: 156 U/L — ABNORMAL HIGH (ref 15–41)
Alkaline Phosphatase: 171 U/L — ABNORMAL HIGH (ref 38–126)
BUN: 28 mg/dL — ABNORMAL HIGH (ref 6–20)
CALCIUM: 7.7 mg/dL — AB (ref 8.9–10.3)
CHLORIDE: 98 mmol/L — AB (ref 101–111)
CO2: 18 mmol/L — AB (ref 22–32)
Creatinine, Ser: 1.01 mg/dL (ref 0.61–1.24)
GFR calc non Af Amer: >60 mL/min (ref >60)
Glucose, Bld: 105 mg/dL — ABNORMAL HIGH (ref 65–99)
POTASSIUM: 4.2 mmol/L (ref 3.5–5.1)
SODIUM: 125 mmol/L — AB (ref 135–145)
Total Bilirubin: 8.2 mg/dL — ABNORMAL HIGH (ref 0.3–1.2)
Total Protein: 5 g/dL — ABNORMAL LOW (ref 6.5–8.1)

## 2017-09-05 LAB — CBC
HEMATOCRIT: 21.3 % — AB (ref 39.0–52.0)
HEMATOCRIT: 27.9 % — AB (ref 39.0–52.0)
HEMOGLOBIN: 7.5 g/dL — AB (ref 13.0–17.0)
Hemoglobin: 9.9 g/dL — ABNORMAL LOW (ref 13.0–17.0)
MCH: 33.3 pg (ref 26.0–34.0)
MCH: 32.7 pg (ref 26.0–34.0)
MCHC: 35.2 g/dL (ref 30.0–36.0)
MCHC: 35.5 g/dL (ref 30.0–36.0)
MCV: 94.7 fL (ref 78.0–100.0)
MCV: 92.1 fL (ref 78.0–100.0)
Platelets: 251 10*3/uL (ref 150–400)
Platelets: 279 10*3/uL (ref 150–400)
RBC: 2.25 MIL/uL — AB (ref 4.22–5.81)
RBC: 3.03 MIL/uL — AB (ref 4.22–5.81)
RDW: 20.5 % — ABNORMAL HIGH (ref 11.5–15.5)
RDW: 19.2 % — ABNORMAL HIGH (ref 11.5–15.5)
WBC: 6.8 10*3/uL (ref 4.0–10.5)
WBC: 6.4 10*3/uL (ref 4.0–10.5)

## 2017-09-05 LAB — OSMOLALITY, URINE: Osmolality, Ur: 347 mOsm/kg (ref 300–900)

## 2017-09-05 LAB — BASIC METABOLIC PANEL
ANION GAP: 8 (ref 5–15)
BUN: 33 mg/dL — ABNORMAL HIGH (ref 6–20)
CALCIUM: 7.7 mg/dL — AB (ref 8.9–10.3)
CO2: 17 mmol/L — AB (ref 22–32)
Chloride: 98 mmol/L — ABNORMAL LOW (ref 101–111)
Creatinine, Ser: 1.22 mg/dL (ref 0.61–1.24)
GFR calc Af Amer: >60 mL/min (ref >60)
GFR calc non Af Amer: 53 mL/min — ABNORMAL LOW (ref >60)
GLUCOSE: 112 mg/dL — AB (ref 65–99)
Potassium: 4.5 mmol/L (ref 3.5–5.1)
Sodium: 123 mmol/L — ABNORMAL LOW (ref 135–145)

## 2017-09-05 LAB — SODIUM, URINE, RANDOM: Sodium, Ur: <10 mmol/L

## 2017-09-05 LAB — LACTATE DEHYDROGENASE: LDH: 206 U/L (ref 120–250)

## 2017-09-05 LAB — OSMOLALITY: Osmolality: 266 mOsm/kg — ABNORMAL LOW (ref 275–295)

## 2017-09-05 LAB — TSH: TSH: 2.158 u[IU]/mL (ref 0.350–4.500)

## 2017-09-05 LAB — GLUCOSE, CAPILLARY: Glucose-Capillary: 118 mg/dL — ABNORMAL HIGH (ref 65–99)

## 2017-09-05 MED ORDER — AMLODIPINE BESYLATE 5 MG PO TABS
5.0000 mg | ORAL_TABLET | Freq: Every day | ORAL | Status: DC
  Administered 2017-09-05 – 2017-09-12 (×6): 5 mg via ORAL
  Filled 2017-09-05 (×6): qty 1

## 2017-09-05 MED ORDER — SODIUM CHLORIDE 0.9 % IV SOLN
INTRAVENOUS | Status: DC
  Administered 2017-09-05: 02:00:00 via INTRAVENOUS

## 2017-09-05 MED ORDER — FUROSEMIDE 10 MG/ML IJ SOLN
40.0000 mg | Freq: Once | INTRAMUSCULAR | Status: AC
  Administered 2017-09-05: 40 mg via INTRAVENOUS
  Filled 2017-09-05: qty 4

## 2017-09-05 NOTE — Evaluation (Signed)
Clinical/Bedside Swallow Evaluation Patient Details  Name: Alexander Rice MRN: [id number] Date of Birth: 1932/05/27  Today's Date: 09/05/2017 Time: SLP Start Time (ACUTE ONLY): 78 SLP Stop Time (ACUTE ONLY): 1130 SLP Time Calculation (min) (ACUTE ONLY): 12 min  Past Medical History:  Past Medical History:  Diagnosis Date  . Adenomatous colon polyp 02/1996, 02/2011   TA polyp 02/2011  . Anemia in chronic renal disease 11/02/2011  . B12 deficiency   . BPH (benign prostatic hyperplasia)   . CAD (coronary artery disease)   . CVD (cardiovascular disease)   . Dementia   . Diverticulosis   . Esophageal stricture   . GERD (gastroesophageal reflux disease)   . Glaucoma   . Heart palpitations   . Hemorrhoids   . Hiatal hernia   . History of colonic polyps 09/29/2006   Polyps age 83, Dr. Fuller Plan stated no further colonoscopy due to age. Confirmed with daughter given alzheimers, CAD history    . Hyperlipidemia   . Hypertension   . MDS (myelodysplastic syndrome) ([place])    Past Surgical History:  Past Surgical History:  Procedure Laterality Date  . CAROTID ENDARTERECTOMY    . COLONOSCOPY    . GLAUCOMA SURGERY Bilateral 11/19/12   pt's report  . INGUINAL HERNIA REPAIR     right side/ twice  . PTCA     stent  . QUADRICEPS REPAIR     muscle attachment  . TONSILLECTOMY     HPI:  83 yo male adm to [facility] with choleycystitis, PMH + for GERD, dementia, glaucoma, anemia, increased liver enzymes, diverticulosis, esopahgeal stricture, MDS.  CXR showed mild atelectasis left lower lobe.  Pt has had prior endoscopy in 03/2012 showing hiatal hernia, normal esophgus and GE junction - Dr Fuller Plan recommended reflux regimen.  Esophagram done 1/20016 ? after right CEA showed tertiary contractions and hiatal hernia, no reflux.  Pt's wife reportedly told MD that pt coughs with intake. No family present during session.    Assessment / Plan / Recommendation Clinical Impression  Patient presents with negative  cranial nerve exam except suspect mild facial asymmetry.  He is impulsive with po intake and takes large liquid boluses despite cues to consume po slowly *also has been npo and may be parched. No indication of airway compromise with po observed (8 ounces thin liquids, 2 boluses jello, single bolus of cracker).  He has increased wheeze with intake and if becomes dyspneic episodic aspiration is likely.  Suspect primary aspiraiton risk is due to known esophageal issues however will follow up for family education to help mitigate dysphagia.  Thanks for this consult.   SLP Visit Diagnosis: Dysphagia, unspecified (R13.10)    Aspiration Risk  Mild aspiration risk    Diet Recommendation  (? clears)   Liquid Administration via: Cup;Straw Medication Administration: Whole meds with puree Supervision: Staff to assist with self feeding Compensations: Slow rate;Small sips/bites    Other  Recommendations Oral Care Recommendations: Oral care BID   Follow up Recommendations        Frequency and Duration min 1 x/week  1 week       Prognosis Prognosis for Safe Diet Advancement: Fair Barriers to Reach Goals: Cognitive deficits;Time post onset      Swallow Study   General Date of Onset: 09/05/17 HPI: 83 yo male adm to [facility] with choleycystitis, PMH + for GERD, dementia, glaucoma, anemia, increased liver enzymes, diverticulosis, esopahgeal stricture, MDS.  CXR showed mild atelectasis left lower lobe.  Pt has had prior  endoscopy in 03/2012 showing hiatal hernia, normal esophgus and GE junction - Dr Fuller Plan recommended reflux regimen.  Esophagram done 1/20016 ? after right CEA showed tertiary contractions and hiatal hernia, no reflux.  Pt's wife reportedly told MD that pt coughs with intake. No family present during session.  Type of Study: Bedside Swallow Evaluation Previous Swallow Assessment: see hpi Diet Prior to this Study: NPO Temperature Spikes Noted: No Respiratory Status: Room air History of Recent  Intubation: No Behavior/Cognition: Alert;Cooperative;Distractible Oral Cavity Assessment: Within Functional Limits Oral Care Completed by SLP: No Oral Cavity - Dentition: Adequate natural dentition Self-Feeding Abilities: Needs assist Patient Positioning: Upright in bed Baseline Vocal Quality: Normal Volitional Cough: Cognitively unable to elicit Volitional Swallow: Unable to elicit    Oral/Motor/Sensory Function Overall Oral Motor/Sensory Function: Other (comment) (? mild facial asymmetry, otherwise unremarkable)   Ice Chips Ice chips: Within functional limits Presentation: Spoon   Thin Liquid Thin Liquid: Within functional limits Presentation: Cup;Self Fed;Straw (with assist) Other Comments: pt is impulsive and takes large sequential boluses but no indication of aspiration, increased wheeze noted with intake =     Nectar Thick     Honey Thick Honey Thick Liquid: Not tested   Puree Puree: Within functional limits Presentation: Spoon   Solid   GO   Solid: Within functional limits        Macario Golds 09/05/2017,11:47 AM  [name], [facility] SLP [phone]

## 2017-09-05 NOTE — Progress Notes (Signed)
PROGRESS NOTE                                                                                                                                                                                                             Patient Demographics:    Alexander Rice, is a 83 y.o. male, DOB - [birth year], GQQ:[id number]  Admit date - 09/04/2017   Admitting Physician Ivor Costa, MD  Outpatient Primary MD for the patient is Yong Channel Brayton Mars, MD  LOS - 1   Chief Complaint  Patient presents with  . Weakness       Brief Narrative   83 y.o. male with medical history significant of MDS (follows with Dr. Marin Olp), hypertension, hyperlipidemia, stroke, CAD s/p of stent placement, BPH, anemia, Parkinson's disease, CKD-3, who presents with low hemoglobin, generalized weakness and fever.workup significant for profound anemia with hemoglobin of 5.2, and choledocholithiasis.   Subjective:    Alexander Rice today has, No headache, No chest pain, No abdominal pain - No Nausea,No Cough - SOB.   Assessment  & Plan :    Principal Problem:   Cholecystitis Active Problems:   Hyperlipidemia   Essential hypertension   CAD (coronary artery disease)   GERD   BPH (benign prostatic hyperplasia)   MDS (myelodysplastic syndrome) (HCC)   Pernicious anemia   Hyponatremia   Moderate dementia, without behavioral disturbance   Acute renal failure superimposed on stage 3 chronic kidney disease (HCC)   AAA (abdominal aortic aneurysm) (HCC)  Choledocholithiasis - MRCP significant for cholelithiasis and choledocholithiasis, with bile duct by dictation,he is a significantly elevated bilirubin,indicative of obstructive jaundice, GI input greatly appreciated, plan for ERCP tomorrow, clear liquid diet today, nothing by mouth after midnight, he is nontoxic appearing, afebrile, no clinical evidence of cholangitis, but given the reported fever at home continue with IV Zosyn for now. -  Surgery input appreciated  symptomatic anemia - Patient with known history of MDS, pernicious anemia requiring transfusions in the past, Hemoccult-negative, transfused 3 units overnight, will recheck CBC, transfuse as needed. -f/u with Dr. Niel Hummer -continue Cyanocobalamin Hyperlipidemia - hold zocor due to abnormal LFT  HTN: - hold lisinopril due to worsening renal function, Blood pressure increasing, will start on amlodipine 5 mg oral. - IV hydralazine when necessary  CAD (coronary artery disease): -  s/p of stent. No CP -given  profound anemia will hold aspirin  AKI in CK D stage III - creatinine elevated at 1.6 on admission, most likely due to volume depletion, improving with blood transfusions and IV fluids  GERD: -Protonix  BPH:  - continue with Cardura  Hyponatremia:  - appear to be chronic, but never this low, appears to be asymptomatic, most likely due to decreased oral intake, improving with IV fluids, monitor closely.  Moderate dementia, without behavioral disturbance: -Opinion with Namenda and supportive care   AAA (abdominal aortic aneurysm) ([place]): - incidental finding on imaging, atherosclerotic disease with abdominal aortic aneurysm 35 mm.will need repeat ultrasound in 2 years -f/u with PCP    Code Status : Full  Family Communication  : none at bedside  Disposition Plan  : Pending further workup.  Consults  :  Gen syrgery, GI  Procedures  : 3 units PRBC 09/05/2017  DVT Prophylaxis  :  SCDs   Lab Results  Component Value Date   PLT 251 09/05/2017    Antibiotics  :    Anti-infectives    Start     Dose/Rate Route Frequency Ordered Stop   09/05/17 0200  piperacillin-tazobactam (ZOSYN) IVPB 3.375 g     3.375 g 12.5 mL/hr over 240 Minutes Intravenous Every 8 hours 09/04/17 2158     09/04/17 2000  piperacillin-tazobactam (ZOSYN) IVPB 3.375 g     3.375 g 100 mL/hr over 30 Minutes Intravenous  Once 09/04/17 1956 09/04/17 2114         Objective:   Vitals:   09/05/17 0730 09/05/17 0912 09/05/17 0945 09/05/17 1249  BP: (!) 142/67 (!) 155/62 (!) 163/58 (!) 174/65  Pulse: 67 68 71 66  Resp: 18 18 18 18   Temp: 98 F (36.7 C) 98.3 F (36.8 C) 98 F (36.7 C) 98.4 F (36.9 C)  TempSrc: Oral Oral Oral Oral  SpO2: 99% 100% 99% 99%  Weight:      Height:        Wt Readings from Last 3 Encounters:  09/04/17 76.7 kg (169 lb)  07/08/17 77.1 kg (170 lb)  06/17/17 76.7 kg (169 lb)     Intake/Output Summary (Last 24 hours) at 09/05/17 1530 Last data filed at 09/05/17 1415  Gross per 24 hour  Intake             3355 ml  Output             2600 ml  Net              755 ml     Physical Exam  Awake Alert, Oriented X 1, confused, pleasant Jaundiced with icteric sclera Symmetrical Chest wall movement, Good air movement bilaterally, CTAB RRR,No Gallops,Rubs or new Murmurs, No Parasternal Heave +ve B.Sounds, Abd Soft, No tenderness,No rebound - guarding or rigidity. No Cyanosis, Clubbing or edema, No new Rash or bruise      Data Review:    CBC  Recent Labs Lab 09/04/17 1220 09/04/17 2010 09/05/17 0535  WBC 10.0 9.7 6.8  HGB 7.1 Repeated and verified X2.* 5.2* 7.5*  HCT 20.5 Repeated and verified X2.* 14.5* 21.3*  PLT 308.0 312 251  MCV 101.4* 94.8 94.7  MCH  --  34.0 33.3  MCHC 34.9 35.9 35.2  RDW 22.7* 23.0* 20.5*  LYMPHSABS 0.4* 0.5*  --   MONOABS 0.5 0.8  --   EOSABS 0.0 0.1  --   BASOSABS 0.0 0.0  --     Chemistries   Recent Labs Lab  09/04/17 1220 09/05/17 0020 09/05/17 0535  NA 122* 123* 125*  K 4.7 4.5 4.2  CL 92* 98* 98*  CO2 21 17* 18*  GLUCOSE 102* 112* 105*  BUN 37* 33* 28*  CREATININE 1.61* 1.22 1.01  CALCIUM 8.4 7.7* 7.7*  AST 249*  --  156*  ALT 322*  --  64*  ALKPHOS 213*  --  171*  BILITOT 10.0*  --  8.2*   ------------------------------------------------------------------------------------------------------------------ No results for input(s): CHOL, HDL, LDLCALC, TRIG,  CHOLHDL, LDLDIRECT in the last 72 hours.  No results found for: HGBA1C ------------------------------------------------------------------------------------------------------------------  Recent Labs  09/05/17 0535  TSH 2.158   ------------------------------------------------------------------------------------------------------------------ No results for input(s): VITAMINB12, FOLATE, FERRITIN, TIBC, IRON, RETICCTPCT in the last 72 hours.  Coagulation profile  Recent Labs Lab 09/04/17 1220  INR 1.3*    No results for input(s): DDIMER in the last 72 hours.  Cardiac Enzymes No results for input(s): CKMB, TROPONINI, MYOGLOBIN in the last 168 hours.  Invalid input(s): CK ------------------------------------------------------------------------------------------------------------------ No results found for: BNP  Inpatient Medications  Scheduled Meds: . acidophilus  1 capsule Oral Daily  . carbidopa-levodopa  0.5 tablet Oral TID  . dorzolamide  1 drop Both Eyes BID  . doxazosin  4 mg Oral QHS  . latanoprost  1 drop Both Eyes QHS  . memantine  10 mg Oral BID  . multivitamin with minerals  1 tablet Oral Daily  . pantoprazole  40 mg Oral Daily  . vitamin B-6  250 mg Oral Daily  . cyanocobalamin  100 mcg Oral Daily   Continuous Infusions: . piperacillin-tazobactam (ZOSYN)  IV Stopped (09/05/17 1331)   PRN Meds:.hydrALAZINE, ondansetron **OR** ondansetron (ZOFRAN) IV, zolpidem  Micro Results No results found for this or any previous visit (from the past 240 hour(s)).  Radiology Reports Dg Chest 2 View  Result Date: 09/04/2017 CLINICAL DATA:  Cough and fevers with increased confusion EXAM: CHEST  2 VIEW COMPARISON:  05/29/2016 FINDINGS: Cardiac shadow remains enlarged. The thoracic aorta is tortuous. Calcifications are noted. Elevation of left hemidiaphragm is again seen. Mild interstitial changes are noted. Mild bibasilar atelectatic changes are noted. No focal infiltrate  is noted. IMPRESSION: Mild bibasilar atelectatic changes. Aortic Atherosclerosis (ICD10-170.0) Electronically Signed   By: Inez Catalina M.D.   On: 09/04/2017 15:22   Ct Abdomen Pelvis W Contrast  Result Date: 09/04/2017 CLINICAL DATA:  Abnormal LFT.  Jaundice EXAM: CT ABDOMEN AND PELVIS WITH CONTRAST TECHNIQUE: Multidetector CT imaging of the abdomen and pelvis was performed using the standard protocol following bolus administration of intravenous contrast. CONTRAST:  <See Chart> ISOVUE-300 IOPAMIDOL (ISOVUE-300) INJECTION 61% COMPARISON:  CT abdomen pelvis 04/01/2012 FINDINGS: Lower chest: Mild left lower lobe atelectasis. Extensive coronary calcification. Small left lower lobe lung nodule on the prior study difficult to see on today's study due to atelectasis Hepatobiliary: Biliary dilatation. Mild intrahepatic and extrahepatic biliary dilatation. Soft tissue density within the common bile duct distally may represent common bile duct stones. Gallbladder is contracted. There is gas in the fundus of the gallbladder. Gallbladder wall not significantly thickened. No focal liver mass. Pancreas: Negative Spleen: Multiple calcified granulomata.  Normal splenic size Adrenals/Urinary Tract: Multiple left renal cyst. No renal mass or obstruction. Urinary bladder normal Stomach/Bowel: Negative for bowel obstruction.  Negative for mass Vascular/Lymphatic: Extensive atherosclerotic calcification. Abdominal aortic aneurysm measuring up to 35 mm, measuring 32 mm previously. Reproductive: Mild prostate enlargement Other: 10 x 20 mm calcification medially inferior to the right lobe of the liver. This was not  present previously and appears separate from bowel. Possible fat necrosis. No surrounding inflammation to suggest acute process. Musculoskeletal: Scoliosis and extensive lumbar degenerative change. X stop device at L4-5. No acute skeletal abnormality. IMPRESSION: Left lower lobe atelectasis.  Extensive coronary  calcification Biliary dilatation. Soft tissue density within the common bile duct may represent gallstones or possibly tumor. In addition, there is gas within the gallbladder fundus. This may be related to recently passed gallstones or emphysematous cholecystitis. However, the gallbladder does not appear to be acutely inflamed or thickened at this time. 10 x 20 mm calcification inferior to the right lobe liver appears nonacute without surrounding edema however this was not present on the prior study Atherosclerotic disease with abdominal aortic aneurysm 35 mm Recommend followup by ultrasound in 2 years. This recommendation follows ACR consensus guidelines: White Paper of the ACR Incidental Findings Committee II on Vascular Findings. J Am Coll Radiol 2013; 10:789-794. Electronically Signed   By: Franchot Gallo M.D.   On: 09/04/2017 19:16   Mr Abdomen Mrcp Wo Contrast  Result Date: 09/04/2017 CLINICAL DATA:  Elevated liver function studies. Gas in the gallbladder on CT. Common duct stones seen on CT. Patient has dementia and unable to hold breath. EXAM: MRI ABDOMEN WITHOUT CONTRAST  (INCLUDING MRCP) TECHNIQUE: Multiplanar multisequence MR imaging of the abdomen was performed. Heavily T2-weighted images of the biliary and pancreatic ducts were obtained, and three-dimensional MRCP images were rendered by post processing. COMPARISON:  CT abdomen and pelvis 09/04/2017 FINDINGS: Examination is technically limited due to motion artifact. Lower chest: There appear to be minimal bilateral pleural effusions. Hepatobiliary: No focal liver lesions are demonstrated. There is diffuse low signal intensity throughout the liver particularly on T2 weighted imaging. This suggests iron deposition. Apparent signal loss in the liver on out of phase imaging suggesting diffuse fatty infiltration as well. Diffuse intra and extrahepatic bile duct dilatation. Multiple filling defects seen in the mid and distal common bile duct consistent  with choledocholithiasis. Gallbladder is partially contracted. Limited visualization of the gallbladder due to technique but no apparent wall thickening. There is a filling defect within the gallbladder likely representing a gallstone. The intraluminal gas seen at CT is not appreciated on MRI. Pancreas: No focal pancreatic lesion or ductal dilatation identified. Spleen: Spleen size is normal. The spleen has a diffusely heterogeneous appearance with tiny nodular pattern demonstrated particularly on T2 weighted imaging. This may reflect changes due to iron overload or may indicate an infectious process. No specific focal lesions are identified. Adrenals/Urinary Tract: No adrenal gland nodules. Bilateral renal cysts. No hydronephrosis or hydroureter. Stomach/Bowel: Visualized portions within the abdomen are unremarkable. Vascular/Lymphatic: Infrarenal aortic ectasia with maximal AP diameter of about 3.4 cm. No significant lymphadenopathy. Other: No free intra-abdominal fluid is demonstrated. Mild edema in the dependent soft tissues. Musculoskeletal: Degenerative changes and scoliosis of the lumbar spine. Postoperative changes in the lower lumbar spine. Multiple areas of central canal stenosis are suggested, most severe at the L5-S1 level. IMPRESSION: 1. Cholelithiasis and choledocholithiasis with associated bile duct dilatation. 2. Diffuse low signal intensity throughout the liver consistent with iron deposition. Probable diffuse fatty infiltration as well. 3. Heterogeneous appearance of the spleen may be related iron deposition or could indicate infectious process. No focal lesions. 4. Bilateral renal cysts. 5. Dilated abdominal aorta with maximal AP diameter 3.4 cm. Recommend followup by ultrasound in 3 years. This recommendation follows ACR consensus guidelines: White Paper of the ACR Incidental Findings Committee II on Vascular Findings. J Am Coll Radiol  2013; 74:081-448 6. Degenerative changes throughout the  lumbar spine with areas of central canal stenosis, most prominent at the L5-S1 level. Electronically Signed   By: Lucienne Capers M.D.   On: 09/04/2017 23:06   Mr 3d Recon At Scanner  Result Date: 09/04/2017 CLINICAL DATA:  Elevated liver function studies. Gas in the gallbladder on CT. Common duct stones seen on CT. Patient has dementia and unable to hold breath. EXAM: MRI ABDOMEN WITHOUT CONTRAST  (INCLUDING MRCP) TECHNIQUE: Multiplanar multisequence MR imaging of the abdomen was performed. Heavily T2-weighted images of the biliary and pancreatic ducts were obtained, and three-dimensional MRCP images were rendered by post processing. COMPARISON:  CT abdomen and pelvis 09/04/2017 FINDINGS: Examination is technically limited due to motion artifact. Lower chest: There appear to be minimal bilateral pleural effusions. Hepatobiliary: No focal liver lesions are demonstrated. There is diffuse low signal intensity throughout the liver particularly on T2 weighted imaging. This suggests iron deposition. Apparent signal loss in the liver on out of phase imaging suggesting diffuse fatty infiltration as well. Diffuse intra and extrahepatic bile duct dilatation. Multiple filling defects seen in the mid and distal common bile duct consistent with choledocholithiasis. Gallbladder is partially contracted. Limited visualization of the gallbladder due to technique but no apparent wall thickening. There is a filling defect within the gallbladder likely representing a gallstone. The intraluminal gas seen at CT is not appreciated on MRI. Pancreas: No focal pancreatic lesion or ductal dilatation identified. Spleen: Spleen size is normal. The spleen has a diffusely heterogeneous appearance with tiny nodular pattern demonstrated particularly on T2 weighted imaging. This may reflect changes due to iron overload or may indicate an infectious process. No specific focal lesions are identified. Adrenals/Urinary Tract: No adrenal gland  nodules. Bilateral renal cysts. No hydronephrosis or hydroureter. Stomach/Bowel: Visualized portions within the abdomen are unremarkable. Vascular/Lymphatic: Infrarenal aortic ectasia with maximal AP diameter of about 3.4 cm. No significant lymphadenopathy. Other: No free intra-abdominal fluid is demonstrated. Mild edema in the dependent soft tissues. Musculoskeletal: Degenerative changes and scoliosis of the lumbar spine. Postoperative changes in the lower lumbar spine. Multiple areas of central canal stenosis are suggested, most severe at the L5-S1 level. IMPRESSION: 1. Cholelithiasis and choledocholithiasis with associated bile duct dilatation. 2. Diffuse low signal intensity throughout the liver consistent with iron deposition. Probable diffuse fatty infiltration as well. 3. Heterogeneous appearance of the spleen may be related iron deposition or could indicate infectious process. No focal lesions. 4. Bilateral renal cysts. 5. Dilated abdominal aorta with maximal AP diameter 3.4 cm. Recommend followup by ultrasound in 3 years. This recommendation follows ACR consensus guidelines: White Paper of the ACR Incidental Findings Committee II on Vascular Findings. J Am Coll Radiol 2013; 18:563-149 6. Degenerative changes throughout the lumbar spine with areas of central canal stenosis, most prominent at the L5-S1 level. Electronically Signed   By: Lucienne Capers M.D.   On: 09/04/2017 23:06    Time Spent in minutes  25 minutes   Alexander Rice M.D on 09/05/2017 at 3:30 PM  Between 7am to 7pm - Pager - [phone]  After 7pm go to [email or web address] - password [facility]  [facility] -  Office  [phone]

## 2017-09-05 NOTE — Consult Note (Signed)
Referring Provider: Dr. Waldron Labs Primary Care Physician:  Alexander Olp, MD Primary Gastroenterologist:  Dr. Fuller Rice  Reason for Consultation:  CBD stones  HPI: Alexander Rice is a 83 y.o. male with medical history significant of MDS (follows with Dr. Marin Rice), hypertension, hyperlipidemia, stroke, CAD s/p of stent placement, BPH, anemia, Parkinson's disease, CKD-3, who presents with low hemoglobin, generalized weakness and fever.  Per pt's daughter, pt felt weak and had fever of 102.  Patient denies nausea, vomiting, diarrhea or, abdominal pain.  Pt was seen by PCP and was found to have hemoglobin 7.0 grams and yellow skin.  ED Course: pt was found to have hemoglobin 5.2 grams (baseline hemoglobin 8.0 grams), abnormal liver function with ALP 213, AST 249, and ALT 322 and total bilirubin 10, WBC 9.7, negative FOBT, alcohol level less than 10, lactic acid 1.37, INR 1.3, worsening renal function, sodium 122, temperature normal, no tachycardia, no tachypnea, chest x-ray negative. Patient is admitted to telemetry  Gen. Surgeon, Dr. Marlou Starks and GI, Dr. Silverio Decamp were consulted.  CT abdomen/pelvis showed:  1. Left lower lobe atelectasis. Extensive coronary calcification 2. Biliary dilatation. Soft tissue density within the common bile duct may represent gallstones or possibly tumor. In addition, there is gas within the gallbladder fundus. This may be related to recently passed gallstones or emphysematous cholecystitis. However, the gallbladder does not appear to be acutely inflamed or thickened at this time. 3. 10 x 20 mm calcification inferior to the right lobe liver appears nonacute without surrounding edema however this was not present on the prior study 4. Atherosclerotic disease with abdominal aortic aneurysm 35 mm   MRCP showed cholelithiasis choledocholithiasis with associated bile duct dilatation.  LFT's down-trending slightly today with total bili 8.2.    Past Medical  History:  Diagnosis Date  . Adenomatous colon polyp 02/1996, 02/2011   TA polyp 02/2011  . Anemia in chronic renal disease 11/02/2011  . B12 deficiency   . BPH (benign prostatic hyperplasia)   . CAD (coronary artery disease)   . CVD (cardiovascular disease)   . Dementia   . Diverticulosis   . Esophageal stricture   . GERD (gastroesophageal reflux disease)   . Glaucoma   . Heart palpitations   . Hemorrhoids   . Hiatal hernia   . History of colonic polyps 09/29/2006   Polyps age 33, Dr. Fuller Rice stated no further colonoscopy due to age. Confirmed with daughter given alzheimers, CAD history    . Hyperlipidemia   . Hypertension   . MDS (myelodysplastic syndrome) ([place])     Past Surgical History:  Procedure Laterality Date  . CAROTID ENDARTERECTOMY    . COLONOSCOPY    . GLAUCOMA SURGERY Bilateral 11/19/12   pt's report  . INGUINAL HERNIA REPAIR     right side/ twice  . PTCA     stent  . QUADRICEPS REPAIR     muscle attachment  . TONSILLECTOMY      Prior to Admission medications   Medication Sig Start Date End Date Taking? Authorizing Provider  aspirin 81 MG tablet Take 81 mg by mouth daily.     Yes [provider]  carbidopa-levodopa (SINEMET IR) 25-100 MG tablet Take 1/2 tablet three times a day Patient taking differently: Take 0.5 tablets by mouth 3 (three) times daily. Take 1/2 tablet three times a day 07/08/17  Yes Cameron Sprang, MD  [facility] OMEGA-3 KRILL OIL PO Take 350 mg by mouth daily. Reported on 01/16/2016   Yes [provider]  cyanocobalamin 100 MCG tablet Take 100 mcg by mouth daily.    Yes [provider]  dorzolamide (TRUSOPT) 2 % ophthalmic solution Place 1 drop into both eyes 2 (two) times daily.     Yes [provider]  doxazosin (CARDURA) 4 MG tablet TAKE 1 TABLET BY MOUTH AT  BEDTIME 11/20/16  Yes Alexander Olp, MD  lisinopril (PRINIVIL,ZESTRIL) 5 MG tablet TAKE 1 TABLET BY MOUTH  DAILY 05/14/17  Yes Alexander Olp, MD    LUMIGAN 0.01 % SOLN Place 1 drop into both eyes at bedtime.  03/23/11  Yes [provider]  memantine (NAMENDA) 10 MG tablet TAKE 1 TABLET BY MOUTH TWO  TIMES DAILY 05/01/17  Yes Cameron Sprang, MD  Multiple Vitamin (MULTIVITAMIN) tablet Take 1 tablet by mouth daily.     Yes [provider]  omeprazole (PRILOSEC) 20 MG capsule TAKE 1 CAPSULE BY MOUTH  DAILY 03/26/17  Yes Alexander Olp, MD  Probiotic Product ([facility] PROBIOTIC) CHEW Chew 1 tablet by mouth daily.    Yes [provider]  Pyridoxine HCl (VITAMIN B-6) 250 MG tablet Take 1 tablet (250 mg total) by mouth daily. 05/17/15  Yes [name], MD  simvastatin (ZOCOR) 40 MG tablet TAKE 1 TABLET BY MOUTH  DAILY AT 6 PM 03/26/17  Yes Alexander Olp, MD  epoetin alfa (PROCRIT) 29528 UNIT/ML injection Inject 1 mL (40,000 Units total) into the skin every 21 ( twenty-one) days. As needed for Hgb < 10g/dl 09/04/17   [place], Holli Humbles, NP    Current Facility-Administered Medications  Medication Dose Route Frequency Provider Last Rate Last Dose  . 0.9 %  sodium chloride infusion   Intravenous Continuous Ivor Costa, MD 100 mL/hr at 09/05/17 0154    . acidophilus (RISAQUAD) capsule 1 capsule  1 capsule Oral Daily Ivor Costa, MD      . aspirin EC tablet 81 mg  81 mg Oral Daily Ivor Costa, MD      . carbidopa-levodopa (SINEMET IR) 25-100 MG per tablet immediate release 0.5 tablet  0.5 tablet Oral TID Ivor Costa, MD   0.5 tablet at 09/05/17 0034  . dorzolamide (TRUSOPT) 2 % ophthalmic solution 1 drop  1 drop Both Eyes BID Ivor Costa, MD   1 drop at 09/05/17 0038  . doxazosin (CARDURA) tablet 4 mg  4 mg Oral QHS Ivor Costa, MD   4 mg at 09/05/17 0034  . hydrALAZINE (APRESOLINE) injection 5 mg  5 mg Intravenous Q2H PRN Ivor Costa, MD      . ibuprofen (ADVIL,MOTRIN) tablet 200 mg  200 mg Oral Q6H PRN Ivor Costa, MD      . latanoprost (XALATAN) 0.005 % ophthalmic solution 1 drop  1 drop Both Eyes QHS Ivor Costa, MD   1 drop at  09/05/17 0032  . memantine (NAMENDA) tablet 10 mg  10 mg Oral BID Ivor Costa, MD   10 mg at 09/05/17 0034  . multivitamin with minerals tablet 1 tablet  1 tablet Oral Daily Ivor Costa, MD      . omega-3 acid ethyl esters (LOVAZA) capsule 1,000 mg  1,000 mg Oral Daily Ivor Costa, MD      . ondansetron [facility]) tablet 4 mg  4 mg Oral Q6H PRN Ivor Costa, MD       Or  . ondansetron (ZOFRAN) injection 4 mg  4 mg Intravenous Q6H PRN Ivor Costa, MD      . pantoprazole (PROTONIX) EC tablet  40 mg  40 mg Oral Daily Ivor Costa, MD      . piperacillin-tazobactam (ZOSYN) IVPB 3.375 g  3.375 g Intravenous Q8H Randa Spike, [place] at 09/05/17 0506  . pyridOXINE (VITAMIN B-6) tablet 250 mg  250 mg Oral Daily Ivor Costa, MD      . vitamin B-12 (CYANOCOBALAMIN) tablet 100 mcg  100 mcg Oral Daily Ivor Costa, MD      . zolpidem (AMBIEN) tablet 5 mg  5 mg Oral QHS PRN Ivor Costa, MD        Allergies as of 09/04/2017  . (No Known Allergies)    Family History  Problem Relation Age of Onset  . COPD Mother   . Alcohol abuse Father     Social History   Social History  . Marital status: Married    Spouse name: N/A  . Number of children: 3  . Years of education: N/A   Occupational History  . Retired Retired   Social History Main Topics  . Smoking status: Former Smoker    Quit date: 11/19/1968  . Smokeless tobacco: Never Used     Comment: never used tobacco  . Alcohol use 4.2 oz/week    7 Standard drinks or equivalent per week     Comment: Wine/Beer  . Drug use: No  . Sexual activity: Not on file   Other Topics Concern  . Not on file   Social History Narrative   Married (wife patient of Dr. Yong Channel as well)      Retired from [employer] logistics/support/administrative officer and [place]      Hobbies: Scientist, research (physical sciences) [employer], [employer] [employer] .    Review of Systems: ROS negative except as mentioned in HPI.  Physical Exam: Vital signs in last 24 hours: Temp:  [97.7 F (36.5 C)-98.9 F (37.2 C)] 98 F  (36.7 C) (10/18 0730) Pulse Rate:  [63-81] 67 (10/18 0730) Resp:  [15-19] 18 (10/18 0730) BP: (110-162)/(45-67) 142/67 (10/18 0730) SpO2:  [91 %-100 %] 99 % (10/18 0730) Weight:  [169 lb (76.7 kg)] 169 lb (76.7 kg) (10/17 2017) Last BM Date: 09/04/17 General:  Alert, Well-developed, well-nourished, pleasant and cooperative in NAD; pleasantly confused. Jaundice noted. Head:  Normocephalic and atraumatic. Eyes:  Scleral icterus noted. Ears:  Normal auditory acuity. Mouth:  No deformity or lesions.   Lungs:  Clear throughout to auscultation.  No wheezes, crackles, or rhonchi.  Heart:  Regular rate and rhythm; no murmurs, clicks, rubs, or gallops. Abdomen:  Soft, non-distended.  BS present.  Non-tender. Rectal:  Deferred  Msk:  Symmetrical without gross deformities. Pulses:  Normal pulses noted. Extremities:  Without clubbing or edema. Neurologic:  Alert; grossly normal neurologically, but very confused. Skin:  Intact without significant lesions or rashes. Psych:  Alert and cooperative. Normal mood and affect.  Intake/Output from previous day: 10/17 0701 - 10/18 0700 In: 2415 [P.O.:90; I.V.:410; Blood:315; IV Piggyback:1600] Out: -  Intake/Output this shift: Total I/O In: -  Out: 600 [Urine:600]  Lab Results:  Recent Labs  09/04/17 1220 09/04/17 2010 09/05/17 0535  WBC 10.0 9.7 6.8  HGB 7.1 Repeated and verified X2.* 5.2* 7.5*  HCT 20.5 Repeated and verified X2.* 14.5* 21.3*  PLT 308.0 312 251   BMET  Recent Labs  09/04/17 1220 09/05/17 0020 09/05/17 0535  NA 122* 123* 125*  K 4.7 4.5 4.2  CL 92* 98* 98*  CO2 21 17* 18*  GLUCOSE 102* 112* 105*  BUN 37* 33* 28*  CREATININE 1.61* 1.22 1.01  CALCIUM 8.4 7.7* 7.7*   LFT  Recent Labs  09/05/17 0535  PROT 5.0*  ALBUMIN 2.8*  AST 156*  ALT 64*  ALKPHOS 171*  BILITOT 8.2*   PT/INR  Recent Labs  09/04/17 1220  LABPROT 13.9*  INR 1.3*   Studies/Results: Dg Chest 2 View  Result Date:  09/04/2017 CLINICAL DATA:  Cough and fevers with increased confusion EXAM: CHEST  2 VIEW COMPARISON:  05/29/2016 FINDINGS: Cardiac shadow remains enlarged. The thoracic aorta is tortuous. Calcifications are noted. Elevation of left hemidiaphragm is again seen. Mild interstitial changes are noted. Mild bibasilar atelectatic changes are noted. No focal infiltrate is noted. IMPRESSION: Mild bibasilar atelectatic changes. Aortic Atherosclerosis (ICD10-170.0) Electronically Signed   By: Inez Catalina M.D.   On: 09/04/2017 15:22   Ct Abdomen Pelvis W Contrast  Result Date: 09/04/2017 CLINICAL DATA:  Abnormal LFT.  Jaundice EXAM: CT ABDOMEN AND PELVIS WITH CONTRAST TECHNIQUE: Multidetector CT imaging of the abdomen and pelvis was performed using the standard protocol following bolus administration of intravenous contrast. CONTRAST:  <See Chart> ISOVUE-300 IOPAMIDOL (ISOVUE-300) INJECTION 61% COMPARISON:  CT abdomen pelvis 04/01/2012 FINDINGS: Lower chest: Mild left lower lobe atelectasis. Extensive coronary calcification. Small left lower lobe lung nodule on the prior study difficult to see on today's study due to atelectasis Hepatobiliary: Biliary dilatation. Mild intrahepatic and extrahepatic biliary dilatation. Soft tissue density within the common bile duct distally may represent common bile duct stones. Gallbladder is contracted. There is gas in the fundus of the gallbladder. Gallbladder wall not significantly thickened. No focal liver mass. Pancreas: Negative Spleen: Multiple calcified granulomata.  Normal splenic size Adrenals/Urinary Tract: Multiple left renal cyst. No renal mass or obstruction. Urinary bladder normal Stomach/Bowel: Negative for bowel obstruction.  Negative for mass Vascular/Lymphatic: Extensive atherosclerotic calcification. Abdominal aortic aneurysm measuring up to 35 mm, measuring 32 mm previously. Reproductive: Mild prostate enlargement Other: 10 x 20 mm calcification medially inferior to  the right lobe of the liver. This was not present previously and appears separate from bowel. Possible fat necrosis. No surrounding inflammation to suggest acute process. Musculoskeletal: Scoliosis and extensive lumbar degenerative change. X stop device at L4-5. No acute skeletal abnormality. IMPRESSION: Left lower lobe atelectasis.  Extensive coronary calcification Biliary dilatation. Soft tissue density within the common bile duct may represent gallstones or possibly tumor. In addition, there is gas within the gallbladder fundus. This may be related to recently passed gallstones or emphysematous cholecystitis. However, the gallbladder does not appear to be acutely inflamed or thickened at this time. 10 x 20 mm calcification inferior to the right lobe liver appears nonacute without surrounding edema however this was not present on the prior study Atherosclerotic disease with abdominal aortic aneurysm 35 mm Recommend followup by ultrasound in 2 years. This recommendation follows ACR consensus guidelines: White Paper of the ACR Incidental Findings Committee II on Vascular Findings. J Am Coll Radiol 2013; 10:789-794. Electronically Signed   By: Franchot Gallo M.D.   On: 09/04/2017 19:16   Mr Abdomen Mrcp Wo Contrast  Result Date: 09/04/2017 CLINICAL DATA:  Elevated liver function studies. Gas in the gallbladder on CT. Common duct stones seen on CT. Patient has dementia and unable to hold breath. EXAM: MRI ABDOMEN WITHOUT CONTRAST  (INCLUDING MRCP) TECHNIQUE: Multiplanar multisequence MR imaging of the abdomen was performed. Heavily T2-weighted images of the biliary and pancreatic ducts were obtained, and three-dimensional MRCP images were rendered by post processing. COMPARISON:  CT abdomen and pelvis 09/04/2017  FINDINGS: Examination is technically limited due to motion artifact. Lower chest: There appear to be minimal bilateral pleural effusions. Hepatobiliary: No focal liver lesions are demonstrated. There is  diffuse low signal intensity throughout the liver particularly on T2 weighted imaging. This suggests iron deposition. Apparent signal loss in the liver on out of phase imaging suggesting diffuse fatty infiltration as well. Diffuse intra and extrahepatic bile duct dilatation. Multiple filling defects seen in the mid and distal common bile duct consistent with choledocholithiasis. Gallbladder is partially contracted. Limited visualization of the gallbladder due to technique but no apparent wall thickening. There is a filling defect within the gallbladder likely representing a gallstone. The intraluminal gas seen at CT is not appreciated on MRI. Pancreas: No focal pancreatic lesion or ductal dilatation identified. Spleen: Spleen size is normal. The spleen has a diffusely heterogeneous appearance with tiny nodular pattern demonstrated particularly on T2 weighted imaging. This may reflect changes due to iron overload or may indicate an infectious process. No specific focal lesions are identified. Adrenals/Urinary Tract: No adrenal gland nodules. Bilateral renal cysts. No hydronephrosis or hydroureter. Stomach/Bowel: Visualized portions within the abdomen are unremarkable. Vascular/Lymphatic: Infrarenal aortic ectasia with maximal AP diameter of about 3.4 cm. No significant lymphadenopathy. Other: No free intra-abdominal fluid is demonstrated. Mild edema in the dependent soft tissues. Musculoskeletal: Degenerative changes and scoliosis of the lumbar spine. Postoperative changes in the lower lumbar spine. Multiple areas of central canal stenosis are suggested, most severe at the L5-S1 level. IMPRESSION: 1. Cholelithiasis and choledocholithiasis with associated bile duct dilatation. 2. Diffuse low signal intensity throughout the liver consistent with iron deposition. Probable diffuse fatty infiltration as well. 3. Heterogeneous appearance of the spleen may be related iron deposition or could indicate infectious process. No  focal lesions. 4. Bilateral renal cysts. 5. Dilated abdominal aorta with maximal AP diameter 3.4 cm. Recommend followup by ultrasound in 3 years. This recommendation follows ACR consensus guidelines: White Paper of the ACR Incidental Findings Committee II on Vascular Findings. J Am Coll Radiol 2013; 62:836-629 6. Degenerative changes throughout the lumbar spine with areas of central canal stenosis, most prominent at the L5-S1 level. Electronically Signed   By: Lucienne Capers M.D.   On: 09/04/2017 23:06   Mr 3d Recon At Scanner  Result Date: 09/04/2017 CLINICAL DATA:  Elevated liver function studies. Gas in the gallbladder on CT. Common duct stones seen on CT. Patient has dementia and unable to hold breath. EXAM: MRI ABDOMEN WITHOUT CONTRAST  (INCLUDING MRCP) TECHNIQUE: Multiplanar multisequence MR imaging of the abdomen was performed. Heavily T2-weighted images of the biliary and pancreatic ducts were obtained, and three-dimensional MRCP images were rendered by post processing. COMPARISON:  CT abdomen and pelvis 09/04/2017 FINDINGS: Examination is technically limited due to motion artifact. Lower chest: There appear to be minimal bilateral pleural effusions. Hepatobiliary: No focal liver lesions are demonstrated. There is diffuse low signal intensity throughout the liver particularly on T2 weighted imaging. This suggests iron deposition. Apparent signal loss in the liver on out of phase imaging suggesting diffuse fatty infiltration as well. Diffuse intra and extrahepatic bile duct dilatation. Multiple filling defects seen in the mid and distal common bile duct consistent with choledocholithiasis. Gallbladder is partially contracted. Limited visualization of the gallbladder due to technique but no apparent wall thickening. There is a filling defect within the gallbladder likely representing a gallstone. The intraluminal gas seen at CT is not appreciated on MRI. Pancreas: No focal pancreatic lesion or ductal  dilatation identified. Spleen: Spleen size  is normal. The spleen has a diffusely heterogeneous appearance with tiny nodular pattern demonstrated particularly on T2 weighted imaging. This may reflect changes due to iron overload or may indicate an infectious process. No specific focal lesions are identified. Adrenals/Urinary Tract: No adrenal gland nodules. Bilateral renal cysts. No hydronephrosis or hydroureter. Stomach/Bowel: Visualized portions within the abdomen are unremarkable. Vascular/Lymphatic: Infrarenal aortic ectasia with maximal AP diameter of about 3.4 cm. No significant lymphadenopathy. Other: No free intra-abdominal fluid is demonstrated. Mild edema in the dependent soft tissues. Musculoskeletal: Degenerative changes and scoliosis of the lumbar spine. Postoperative changes in the lower lumbar spine. Multiple areas of central canal stenosis are suggested, most severe at the L5-S1 level. IMPRESSION: 1. Cholelithiasis and choledocholithiasis with associated bile duct dilatation. 2. Diffuse low signal intensity throughout the liver consistent with iron deposition. Probable diffuse fatty infiltration as well. 3. Heterogeneous appearance of the spleen may be related iron deposition or could indicate infectious process. No focal lesions. 4. Bilateral renal cysts. 5. Dilated abdominal aorta with maximal AP diameter 3.4 cm. Recommend followup by ultrasound in 3 years. This recommendation follows ACR consensus guidelines: White Paper of the ACR Incidental Findings Committee II on Vascular Findings. J Am Coll Radiol 2013; 56:389-373 6. Degenerative changes throughout the lumbar spine with areas of central canal stenosis, most prominent at the L5-S1 level. Electronically Signed   By: Lucienne Capers M.D.   On: 09/04/2017 23:06   IMPRESSION:  *83 year old male with obstructive jaundice.  LFT's normal 2 weeks ago.  No abdominal pain, but was weak and having fevers.  MRCP shows CBD stones with dilation.  On  Zosyn. *MDS with anemia:  Receiving 3 units PRBC's.  FOBT negative.  Follows with Dr. Marin Rice.  Rice: *Continue antibiotics.  *Will Rice for ERCP on 10/19 at 12:30 pm.  Will speak with his daughter about his procedure since patient will not be able to sign consent. *Surgery already seeing the patient as well. *Trend LFT's.  ZEHR, JESSICA D.  09/05/2017, 8:59 AM  Pager number [phone]   ________________________________________________________________________  Velora Heckler GI MD note:  I personally examined the patient, reviewed the data and agree with the assessment and Rice described above.  Biliary obstruction from pretty obvious stones in CBD on CT and MRCP.  PLanning for ERCP tomorrow.  Will stay on IV abx for now.   Owens Loffler, MD [facility] Gastroenterology Pager [phone]

## 2017-09-05 NOTE — Progress Notes (Signed)
CC:  Weakness  Subjective: pleasantly confused, knows he is at [facility], but has no idea why, knows they are giving him blood.  Denies pain, I ask him about pain with eating and he said,  "no, i like to eat."    Objective: Vital signs in last 24 hours: Temp:  [97.7 F (36.5 C)-98.9 F (37.2 C)] 98 F (36.7 C) (10/18 0730) Pulse Rate:  [63-81] 67 (10/18 0730) Resp:  [15-19] 18 (10/18 0730) BP: (110-162)/(45-67) 142/67 (10/18 0730) SpO2:  [91 %-100 %] 99 % (10/18 0730) Weight:  [76.7 kg (169 lb)] 76.7 kg (169 lb) (10/17 2017) Last BM Date: 09/04/17 90 PO 725 IV Afebrile, VSS WBC 6.8 H/H 7.5/21 transfused 2 units last PM MRCP:  Cholelithiasis and choledocholithiasis with associated bile duct dilatation.  Intake/Output from previous day: 10/17 0701 - 10/18 0700 In: 2415 [P.O.:90; I.V.:410; Blood:315; IV Piggyback:1600] Out: -  Intake/Output this shift: Total I/O In: -  Out: 600 [Urine:600]  General appearance: alert, cooperative, no distress and jaundice GI: soft, non-tender; bowel sounds normal; no masses,  no organomegaly  Lab Results:   Recent Labs  09/04/17 2010 09/05/17 0535  WBC 9.7 6.8  HGB 5.2* 7.5*  HCT 14.5* 21.3*  PLT 312 251    BMET  Recent Labs  09/05/17 0020 09/05/17 0535  NA 123* 125*  K 4.5 4.2  CL 98* 98*  CO2 17* 18*  GLUCOSE 112* 105*  BUN 33* 28*  CREATININE 1.22 1.01  CALCIUM 7.7* 7.7*   PT/INR  Recent Labs  09/04/17 1220  LABPROT 13.9*  INR 1.3*     Recent Labs Lab 09/04/17 1220 09/05/17 0535  AST 249* 156*  ALT 322* 64*  ALKPHOS 213* 171*  BILITOT 10.0* 8.2*  PROT 5.4* 5.0*  ALBUMIN 3.7 2.8*     Lipase     Component Value Date/Time   LIPASE 33 03/22/2015 1656     Medications: . acidophilus  1 capsule Oral Daily  . aspirin EC  81 mg Oral Daily  . carbidopa-levodopa  0.5 tablet Oral TID  . dorzolamide  1 drop Both Eyes BID  . doxazosin  4 mg Oral QHS  . latanoprost  1 drop Both Eyes QHS  . memantine   10 mg Oral BID  . multivitamin with minerals  1 tablet Oral Daily  . omega-3 acid ethyl esters  1,000 mg Oral Daily  . pantoprazole  40 mg Oral Daily  . vitamin B-6  250 mg Oral Daily  . cyanocobalamin  100 mcg Oral Daily   . sodium chloride 100 mL/hr at 09/05/17 0154  . piperacillin-tazobactam (ZOSYN)  IV Stopped (09/05/17 0506)   Anti-infectives    Start     Dose/Rate Route Frequency Ordered Stop   09/05/17 0200  piperacillin-tazobactam (ZOSYN) IVPB 3.375 g     3.375 g 12.5 mL/hr over 240 Minutes Intravenous Every 8 hours 09/04/17 2158     09/04/17 2000  piperacillin-tazobactam (ZOSYN) IVPB 3.375 g     3.375 g 100 mL/hr over 30 Minutes Intravenous  Once 09/04/17 1956 09/04/17 2114      Assessment/Plan Abdominal pain Cholelithiasis/choledocholithiasis Severe anemia H/H= 5.2/14.5 Hyponatremia  Dementia Hypertension Chronic kidney disease GERD FEN: IV fluids/NPO ID:  Zosyn 10/17 =>>day 2 DVT:  SCD's ordered   Plan:  He will need an ERCP.  He has multiple medical issues.  If he does well with ERCP, a decision on whether to go forward with Cholecystectomy or just see if he  has a reoccurrence, may be considered.   He would need medical clearance for surgery.    We will follow with you.      LOS: 1 day   JENNINGS,WILLARD 09/05/2017 [phone]  Agree with above. Jaundiced.  T. Bili - 8.2 (09/05/2017) Pleasantly confused. Will follow.  Alphonsa Overall, MD, [facility] Surgery Pager: [phone] Office phone:  [phone]

## 2017-09-05 NOTE — Evaluation (Signed)
SLP Cancellation Note  Patient Details Name: Alexander Rice MRN: [id number] DOB: 09/23/1932   Cancelled treatment:       Reason Eval/Treat Not Completed: Medical issues which prohibited therapy (pt npo for potential ercp, asked RN to page if pt able to have po)   [name], [place] [facility] SLP [phone]

## 2017-09-06 ENCOUNTER — Encounter (HOSPITAL_COMMUNITY)
Admission: EM | Disposition: A | Discharge status: Home Health | Payer: Self-pay | Source: Home / Self Care | Attending: Internal Medicine

## 2017-09-06 ENCOUNTER — Inpatient Hospital Stay (HOSPITAL_COMMUNITY): Payer: Medicare Other

## 2017-09-06 ENCOUNTER — Inpatient Hospital Stay (HOSPITAL_COMMUNITY): Payer: Medicare Other | Admitting: Certified Registered Nurse Anesthetist

## 2017-09-06 ENCOUNTER — Encounter (HOSPITAL_COMMUNITY): Payer: Self-pay | Admitting: Emergency Medicine

## 2017-09-06 DIAGNOSIS — K8050 Calculus of bile duct without cholangitis or cholecystitis without obstruction: Secondary | ICD-10-CM

## 2017-09-06 HISTORY — PX: ERCP: SHX5425

## 2017-09-06 LAB — BPAM RBC
BLOOD PRODUCT EXPIRATION DATE: 201810292359
BLOOD PRODUCT EXPIRATION DATE: 201810292359
Blood Product Expiration Date: 201810292359
ISSUE DATE / TIME: 201810180426
ISSUE DATE / TIME: 201810180922
ISSUE DATE / TIME: 201810180111
UNIT TYPE AND RH: 6200
Unit Type and Rh: 6200
Unit Type and Rh: 6200

## 2017-09-06 LAB — CBC
HCT: 27.5 % — ABNORMAL LOW (ref 39.0–52.0)
HEMOGLOBIN: 9.8 g/dL — AB (ref 13.0–17.0)
MCH: 32.9 pg (ref 26.0–34.0)
MCHC: 35.6 g/dL (ref 30.0–36.0)
MCV: 92.3 fL (ref 78.0–100.0)
Platelets: 287 10*3/uL (ref 150–400)
RBC: 2.98 MIL/uL — ABNORMAL LOW (ref 4.22–5.81)
RDW: 19.7 % — ABNORMAL HIGH (ref 11.5–15.5)
WBC: 5.1 10*3/uL (ref 4.0–10.5)

## 2017-09-06 LAB — COMPREHENSIVE METABOLIC PANEL
ALBUMIN: 2.9 g/dL — AB (ref 3.5–5.0)
ALK PHOS: 155 U/L — AB (ref 38–126)
ALT: 85 U/L — AB (ref 17–63)
ANION GAP: 8 (ref 5–15)
AST: 97 U/L — AB (ref 15–41)
BUN: 20 mg/dL (ref 6–20)
CALCIUM: 8.1 mg/dL — AB (ref 8.9–10.3)
CO2: 19 mmol/L — AB (ref 22–32)
CREATININE: 0.95 mg/dL (ref 0.61–1.24)
Chloride: 100 mmol/L — ABNORMAL LOW (ref 101–111)
GFR calc Af Amer: >60 mL/min (ref >60)
GFR calc non Af Amer: >60 mL/min (ref >60)
GLUCOSE: 94 mg/dL (ref 65–99)
Potassium: 4.3 mmol/L (ref 3.5–5.1)
SODIUM: 127 mmol/L — AB (ref 135–145)
Total Bilirubin: 4.3 mg/dL — ABNORMAL HIGH (ref 0.3–1.2)
Total Protein: 5.2 g/dL — ABNORMAL LOW (ref 6.5–8.1)

## 2017-09-06 LAB — TYPE AND SCREEN
ABO/RH(D): A POS
Antibody Screen: NEGATIVE
Unit division: 0
Unit division: 0
Unit division: 0

## 2017-09-06 LAB — HEPATITIS PANEL, ACUTE
HEP A IGM: NEGATIVE
Hep B C IgM: NEGATIVE
Hepatitis B Surface Ag: NEGATIVE

## 2017-09-06 LAB — GLUCOSE, CAPILLARY: Glucose-Capillary: 104 mg/dL — ABNORMAL HIGH (ref 65–99)

## 2017-09-06 SURGERY — ERCP, WITH INTERVENTION IF INDICATED
Anesthesia: General

## 2017-09-06 MED ORDER — PROMETHAZINE HCL 25 MG/ML IJ SOLN: 6.2500 mg | INTRAMUSCULAR | Status: DC | PRN

## 2017-09-06 MED ORDER — PHENYLEPHRINE 40 MCG/ML (10ML) SYRINGE FOR IV PUSH (FOR BLOOD PRESSURE SUPPORT)
PREFILLED_SYRINGE | INTRAVENOUS | Status: AC
  Filled 2017-09-06: qty 10

## 2017-09-06 MED ORDER — DEXAMETHASONE SODIUM PHOSPHATE 10 MG/ML IJ SOLN
INTRAMUSCULAR | Status: AC
  Filled 2017-09-06: qty 1

## 2017-09-06 MED ORDER — ONDANSETRON HCL 4 MG/2ML IJ SOLN
INTRAMUSCULAR | Status: DC | PRN
  Administered 2017-09-06: 4 mg via INTRAVENOUS

## 2017-09-06 MED ORDER — INDOMETHACIN 50 MG RE SUPP
RECTAL | Status: AC
Start: 2017-09-06 — End: ?
  Filled 2017-09-06: qty 2

## 2017-09-06 MED ORDER — PROPOFOL 10 MG/ML IV BOLUS
INTRAVENOUS | Status: DC | PRN
  Administered 2017-09-06: 50 mg via INTRAVENOUS
  Administered 2017-09-06: 110 mg via INTRAVENOUS

## 2017-09-06 MED ORDER — FENTANYL CITRATE (PF) 100 MCG/2ML IJ SOLN: 25.0000 ug | INTRAMUSCULAR | Status: DC | PRN

## 2017-09-06 MED ORDER — LIDOCAINE 2% (20 MG/ML) 5 ML SYRINGE
INTRAMUSCULAR | Status: DC | PRN
  Administered 2017-09-06: 80 mg via INTRAVENOUS

## 2017-09-06 MED ORDER — ROCURONIUM BROMIDE 50 MG/5ML IV SOSY
PREFILLED_SYRINGE | INTRAVENOUS | Status: AC
  Filled 2017-09-06: qty 5

## 2017-09-06 MED ORDER — GLUCAGON HCL RDNA (DIAGNOSTIC) 1 MG IJ SOLR
INTRAMUSCULAR | Status: AC
  Filled 2017-09-06: qty 1

## 2017-09-06 MED ORDER — SUGAMMADEX SODIUM 200 MG/2ML IV SOLN
INTRAVENOUS | Status: AC
  Filled 2017-09-06: qty 2

## 2017-09-06 MED ORDER — LACTATED RINGERS IV SOLN
INTRAVENOUS | Status: DC
  Administered 2017-09-06 (×2): via INTRAVENOUS

## 2017-09-06 MED ORDER — SUGAMMADEX SODIUM 200 MG/2ML IV SOLN
INTRAVENOUS | Status: DC | PRN
  Administered 2017-09-06: 200 mg via INTRAVENOUS

## 2017-09-06 MED ORDER — ONDANSETRON HCL 4 MG/2ML IJ SOLN
INTRAMUSCULAR | Status: AC
Start: 2017-09-06 — End: ?
  Filled 2017-09-06: qty 2

## 2017-09-06 MED ORDER — INDOMETHACIN 50 MG RE SUPP: 100.0000 mg | Freq: Once | RECTAL | Status: DC

## 2017-09-06 MED ORDER — LIDOCAINE 2% (20 MG/ML) 5 ML SYRINGE
INTRAMUSCULAR | Status: AC
  Filled 2017-09-06: qty 5

## 2017-09-06 MED ORDER — FENTANYL CITRATE (PF) 100 MCG/2ML IJ SOLN
INTRAMUSCULAR | Status: AC
  Filled 2017-09-06: qty 2

## 2017-09-06 MED ORDER — FENTANYL CITRATE (PF) 100 MCG/2ML IJ SOLN
INTRAMUSCULAR | Status: DC | PRN
  Administered 2017-09-06 (×2): 50 ug via INTRAVENOUS

## 2017-09-06 MED ORDER — SODIUM CHLORIDE 0.9 % IV SOLN
INTRAVENOUS | Status: DC
  Administered 2017-09-06: 08:00:00 via INTRAVENOUS

## 2017-09-06 MED ORDER — INDOMETHACIN 50 MG RE SUPP
RECTAL | Status: DC | PRN
  Administered 2017-09-06: 100 mg via RECTAL

## 2017-09-06 MED ORDER — EPHEDRINE SULFATE-NACL 50-0.9 MG/10ML-% IV SOSY
PREFILLED_SYRINGE | INTRAVENOUS | Status: DC | PRN
  Administered 2017-09-06: 10 mg via INTRAVENOUS

## 2017-09-06 MED ORDER — DEXAMETHASONE SODIUM PHOSPHATE 10 MG/ML IJ SOLN
INTRAMUSCULAR | Status: DC | PRN
  Administered 2017-09-06: 10 mg via INTRAVENOUS

## 2017-09-06 MED ORDER — EPHEDRINE 5 MG/ML INJ
INTRAVENOUS | Status: AC
Start: 2017-09-06 — End: ?
  Filled 2017-09-06: qty 10

## 2017-09-06 MED ORDER — ROCURONIUM BROMIDE 50 MG/5ML IV SOSY
PREFILLED_SYRINGE | INTRAVENOUS | Status: DC | PRN
  Administered 2017-09-06: 40 mg via INTRAVENOUS

## 2017-09-06 MED ORDER — KCL IN DEXTROSE-NACL 20-5-0.45 MEQ/L-%-% IV SOLN
INTRAVENOUS | Status: DC
  Administered 2017-09-06 – 2017-09-10 (×6): via INTRAVENOUS
  Filled 2017-09-06 (×9): qty 1000

## 2017-09-06 MED ORDER — SODIUM CHLORIDE 0.9 % IV SOLN
INTRAVENOUS | Status: DC | PRN
  Administered 2017-09-06: 40 mL

## 2017-09-06 MED ORDER — PHENYLEPHRINE 40 MCG/ML (10ML) SYRINGE FOR IV PUSH (FOR BLOOD PRESSURE SUPPORT)
PREFILLED_SYRINGE | INTRAVENOUS | Status: DC | PRN
  Administered 2017-09-06 (×7): 120 ug via INTRAVENOUS

## 2017-09-06 NOTE — Progress Notes (Signed)
PHARMACY NOTE:  Pharmacy has been assisting with dosing of Zosyn for cholecystitis. Dosage remains stable at Zosyn 3.375g IV q8h (each dose infused over 4 hours) and need for further dosage adjustment appears unlikely at present.    Will sign off at this time.  Please reconsult if a change in clinical status warrants re-evaluation of dosage.   Lindell Spar, PharmD, BCPS Pager: [phone] 09/06/2017 8:15 AM

## 2017-09-06 NOTE — Progress Notes (Signed)
To Endoscopy family at [facility]

## 2017-09-06 NOTE — Anesthesia Postprocedure Evaluation (Signed)
Anesthesia Post Note  Patient: Alexander Rice  Procedure(s) Performed: ENDOSCOPIC RETROGRADE CHOLANGIOPANCREATOGRAPHY (ERCP) (N/A )     Patient location during evaluation: PACU Anesthesia Type: General Level of consciousness: awake and alert Pain management: pain level controlled Vital Signs Assessment: post-procedure vital signs reviewed and stable Respiratory status: spontaneous breathing, nonlabored ventilation, respiratory function stable and patient connected to nasal cannula oxygen Cardiovascular status: blood pressure returned to baseline and stable Postop Assessment: no apparent nausea or vomiting Anesthetic complications: no    Last Vitals:  Vitals:   09/06/17 1400 09/06/17 1410  BP: (!) 130/45 (!) 140/48  Pulse: (!) 57 (!) 56  Resp: 10 10  Temp: 36.4 C   SpO2: 100% 97%    Last Pain:  Vitals:   09/06/17 1400  TempSrc: Oral  PainSc:                  Alohilani Levenhagen S

## 2017-09-06 NOTE — Progress Notes (Addendum)
PROGRESS NOTE                                                                                                                                                                                                             Patient Demographics:    Alexander Rice, is a 83 y.o. male, DOB - [birth year], ZOX:[id number]  Admit date - 09/04/2017   Admitting Physician Ivor Costa, MD  Outpatient Primary MD for the patient is Yong Channel Brayton Mars, MD  LOS - 2   Chief Complaint  Patient presents with  . Weakness       Brief Narrative   83 y.o. male with medical history significant of MDS (follows with Dr. Marin Olp), hypertension, hyperlipidemia, stroke, CAD s/p of stent placement, BPH, anemia, Parkinson's disease, CKD-3, who presents with low hemoglobin, generalized weakness and fever.workup significant for profound anemia with hemoglobin of 5.2, and choledocholithiasis.transfused 2 units PRBC with good response, went for ERCP 09/06/2017 significant for with large stone burden, cleared with biliary sphincterotomy and balloon extraction.   Subjective:    Alexander Rice with dementia, he denies any headache, chest pain, abdominal pain, no nausea or vomiting today has, No headache, No chest pain, No abdominal pain - No Nausea,No Cough - SOB.   Assessment  & Plan :    Principal Problem:   Cholecystitis Active Problems:   Hyperlipidemia   Essential hypertension   CAD (coronary artery disease)   GERD   BPH (benign prostatic hyperplasia)   MDS (myelodysplastic syndrome) (HCC)   Pernicious anemia   Hyponatremia   Moderate dementia, without behavioral disturbance   Acute renal failure superimposed on stage 3 chronic kidney disease (HCC)   AAA (abdominal aortic aneurysm) (HCC)   Choledocholithiasis  Choledocholithiasis - MRCP significant for cholelithiasis and choledocholithiasis, with bile duct by dictation,he is a significantly elevated bilirubin,indicative of  obstructive jaundice, - GI input greatly appreciated, went for ERCP, significant for Extensive choledocholithiasis was found. Complete removal was accomplished by biliary sphincterotomy and balloon extraction. - given the reported fever at home continue with IV Zosyn for now. - followed by surgery, will need laparoscopic cholecystectomy this admission  symptomatic anemia - Patient with known history of MDS, pernicious anemia requiring transfusions in the past, Hemoccult-negative, transfused 3 units overnight good response, hemoglobin is 9.8 - Monitor CBC and transfuse as needed,  - f/u  with Dr. Niel Hummer -continue Cyanocobalamin  Hyperlipidemia - hold zocor due to abnormal LFT  HTN: - hold lisinopril due to worsening renal function, Blood pressure increasing, will start on amlodipine 5 mg oral. - IV hydralazine when necessary  CAD (coronary artery disease): -  s/p of stent. No CP -given profound anemia will hold aspirin  AKI in CK D stage III - creatinine elevated at 1.6 on admission, most likely due to volume depletion, improving with blood transfusions and IV fluids  GERD: -Protonix  BPH:  - continue with Cardura  Hyponatremia:  - appear to be chronic, but never this low, appears to be asymptomatic, most likely due to decreased oral intake, improving with IV fluids, monitor closely.  Moderate dementia, without behavioral disturbance: -Opinion with Namenda and supportive care   AAA (abdominal aortic aneurysm) ([place]): - incidental finding on imaging, atherosclerotic disease with abdominal aortic aneurysm 35 mm.will need repeat ultrasound in 2 years -f/u with PCP    Code Status : Full  Family Communication  : none at bedside  Disposition Plan  : Pending further workup.  Consults  :  Gen syrgery, GI  Procedures  : 3 units PRBC 09/05/2017, ERCP 09/06/2017  DVT Prophylaxis  :  SCDs   Lab Results  Component Value Date   PLT 287 09/06/2017    Antibiotics  :     Anti-infectives    Start     Dose/Rate Route Frequency Ordered Stop   09/05/17 0200  piperacillin-tazobactam (ZOSYN) IVPB 3.375 g     3.375 g 12.5 mL/hr over 240 Minutes Intravenous Every 8 hours 09/04/17 2158     09/04/17 2000  piperacillin-tazobactam (ZOSYN) IVPB 3.375 g     3.375 g 100 mL/hr over 30 Minutes Intravenous  Once 09/04/17 1956 09/04/17 2114        Objective:   Vitals:   09/06/17 1350 09/06/17 1400 09/06/17 1410 09/06/17 1435  BP: (!) 125/43 (!) 130/45 (!) 140/48 (!) 159/57  Pulse: (!) 57 (!) 57 (!) 56 65  Resp: 12 10 10 12   Temp:  97.6 F (36.4 C)  97.8 F (36.6 C)  TempSrc:  Oral  Oral  SpO2: 100% 100% 97% 96%  Weight:      Height:        Wt Readings from Last 3 Encounters:  09/06/17 76.7 kg (169 lb)  07/08/17 77.1 kg (170 lb)  06/17/17 76.7 kg (169 lb)     Intake/Output Summary (Last 24 hours) at 09/06/17 1456 Last data filed at 09/06/17 1437  Gross per 24 hour  Intake             1150 ml  Output             1975 ml  Net             -825 ml     Physical Exam  Seen and examined before ERCP  Awake alert oriented, pleasantly confused Good air entry bilaterally, no wheezing rales or rhonchi, clear to auscultation Regular rate and rhythm, no rubs, gallops abdomen soft, nontender, nondistended, bowel sounds present No Cyanosis, Clubbing or edema, No new Rash or bruise      Data Review:    CBC  Recent Labs Lab 09/04/17 1220 09/04/17 2010 09/05/17 0535 09/05/17 1540 09/06/17 0513  WBC 10.0 9.7 6.8 6.4 5.1  HGB 7.1 Repeated and verified X2.* 5.2* 7.5* 9.9* 9.8*  HCT 20.5 Repeated and verified X2.* 14.5* 21.3* 27.9* 27.5*  PLT 308.0 312 251 279  287  MCV 101.4* 94.8 94.7 92.1 92.3  MCH  --  34.0 33.3 32.7 32.9  MCHC 34.9 35.9 35.2 35.5 35.6  RDW 22.7* 23.0* 20.5* 19.2* 19.7*  LYMPHSABS 0.4* 0.5*  --   --   --   MONOABS 0.5 0.8  --   --   --   EOSABS 0.0 0.1  --   --   --   BASOSABS 0.0 0.0  --   --   --     Chemistries    Recent Labs Lab 09/04/17 1220 09/05/17 0020 09/05/17 0535 09/06/17 0513  NA 122* 123* 125* 127*  K 4.7 4.5 4.2 4.3  CL 92* 98* 98* 100*  CO2 21 17* 18* 19*  GLUCOSE 102* 112* 105* 94  BUN 37* 33* 28* 20  CREATININE 1.61* 1.22 1.01 0.95  CALCIUM 8.4 7.7* 7.7* 8.1*  AST 249*  --  156* 97*  ALT 322*  --  64* 85*  ALKPHOS 213*  --  171* 155*  BILITOT 10.0*  --  8.2* 4.3*   ------------------------------------------------------------------------------------------------------------------ No results for input(s): CHOL, HDL, LDLCALC, TRIG, CHOLHDL, LDLDIRECT in the last 72 hours.  No results found for: HGBA1C ------------------------------------------------------------------------------------------------------------------  Recent Labs  09/05/17 0535  TSH 2.158   ------------------------------------------------------------------------------------------------------------------ No results for input(s): VITAMINB12, FOLATE, FERRITIN, TIBC, IRON, RETICCTPCT in the last 72 hours.  Coagulation profile  Recent Labs Lab 09/04/17 1220  INR 1.3*    No results for input(s): DDIMER in the last 72 hours.  Cardiac Enzymes No results for input(s): CKMB, TROPONINI, MYOGLOBIN in the last 168 hours.  Invalid input(s): CK ------------------------------------------------------------------------------------------------------------------ No results found for: BNP  Inpatient Medications  Scheduled Meds: . acidophilus  1 capsule Oral Daily  . amLODipine  5 mg Oral Daily  . carbidopa-levodopa  0.5 tablet Oral TID  . dorzolamide  1 drop Both Eyes BID  . doxazosin  4 mg Oral QHS  . indomethacin  100 mg Rectal Once  . latanoprost  1 drop Both Eyes QHS  . memantine  10 mg Oral BID  . multivitamin with minerals  1 tablet Oral Daily  . pantoprazole  40 mg Oral Daily  . vitamin B-6  250 mg Oral Daily  . cyanocobalamin  100 mcg Oral Daily   Continuous Infusions: . piperacillin-tazobactam  (ZOSYN)  IV Stopped (09/06/17 1440)   PRN Meds:.hydrALAZINE, ondansetron **OR** ondansetron (ZOFRAN) IV, zolpidem  Micro Results No results found for this or any previous visit (from the past 240 hour(s)).  Radiology Reports Dg Chest 2 View  Result Date: 09/04/2017 CLINICAL DATA:  Cough and fevers with increased confusion EXAM: CHEST  2 VIEW COMPARISON:  05/29/2016 FINDINGS: Cardiac shadow remains enlarged. The thoracic aorta is tortuous. Calcifications are noted. Elevation of left hemidiaphragm is again seen. Mild interstitial changes are noted. Mild bibasilar atelectatic changes are noted. No focal infiltrate is noted. IMPRESSION: Mild bibasilar atelectatic changes. Aortic Atherosclerosis (ICD10-170.0) Electronically Signed   By: Inez Catalina M.D.   On: 09/04/2017 15:22   Ct Abdomen Pelvis W Contrast  Result Date: 09/04/2017 CLINICAL DATA:  Abnormal LFT.  Jaundice EXAM: CT ABDOMEN AND PELVIS WITH CONTRAST TECHNIQUE: Multidetector CT imaging of the abdomen and pelvis was performed using the standard protocol following bolus administration of intravenous contrast. CONTRAST:  <See Chart> ISOVUE-300 IOPAMIDOL (ISOVUE-300) INJECTION 61% COMPARISON:  CT abdomen pelvis 04/01/2012 FINDINGS: Lower chest: Mild left lower lobe atelectasis. Extensive coronary calcification. Small left lower lobe lung nodule on the prior study difficult to see  on today's study due to atelectasis Hepatobiliary: Biliary dilatation. Mild intrahepatic and extrahepatic biliary dilatation. Soft tissue density within the common bile duct distally may represent common bile duct stones. Gallbladder is contracted. There is gas in the fundus of the gallbladder. Gallbladder wall not significantly thickened. No focal liver mass. Pancreas: Negative Spleen: Multiple calcified granulomata.  Normal splenic size Adrenals/Urinary Tract: Multiple left renal cyst. No renal mass or obstruction. Urinary bladder normal Stomach/Bowel: Negative for  bowel obstruction.  Negative for mass Vascular/Lymphatic: Extensive atherosclerotic calcification. Abdominal aortic aneurysm measuring up to 35 mm, measuring 32 mm previously. Reproductive: Mild prostate enlargement Other: 10 x 20 mm calcification medially inferior to the right lobe of the liver. This was not present previously and appears separate from bowel. Possible fat necrosis. No surrounding inflammation to suggest acute process. Musculoskeletal: Scoliosis and extensive lumbar degenerative change. X stop device at L4-5. No acute skeletal abnormality. IMPRESSION: Left lower lobe atelectasis.  Extensive coronary calcification Biliary dilatation. Soft tissue density within the common bile duct may represent gallstones or possibly tumor. In addition, there is gas within the gallbladder fundus. This may be related to recently passed gallstones or emphysematous cholecystitis. However, the gallbladder does not appear to be acutely inflamed or thickened at this time. 10 x 20 mm calcification inferior to the right lobe liver appears nonacute without surrounding edema however this was not present on the prior study Atherosclerotic disease with abdominal aortic aneurysm 35 mm Recommend followup by ultrasound in 2 years. This recommendation follows ACR consensus guidelines: White Paper of the ACR Incidental Findings Committee II on Vascular Findings. J Am Coll Radiol 2013; 10:789-794. Electronically Signed   By: Franchot Gallo M.D.   On: 09/04/2017 19:16   Mr Abdomen Mrcp Wo Contrast  Result Date: 09/04/2017 CLINICAL DATA:  Elevated liver function studies. Gas in the gallbladder on CT. Common duct stones seen on CT. Patient has dementia and unable to hold breath. EXAM: MRI ABDOMEN WITHOUT CONTRAST  (INCLUDING MRCP) TECHNIQUE: Multiplanar multisequence MR imaging of the abdomen was performed. Heavily T2-weighted images of the biliary and pancreatic ducts were obtained, and three-dimensional MRCP images were rendered  by post processing. COMPARISON:  CT abdomen and pelvis 09/04/2017 FINDINGS: Examination is technically limited due to motion artifact. Lower chest: There appear to be minimal bilateral pleural effusions. Hepatobiliary: No focal liver lesions are demonstrated. There is diffuse low signal intensity throughout the liver particularly on T2 weighted imaging. This suggests iron deposition. Apparent signal loss in the liver on out of phase imaging suggesting diffuse fatty infiltration as well. Diffuse intra and extrahepatic bile duct dilatation. Multiple filling defects seen in the mid and distal common bile duct consistent with choledocholithiasis. Gallbladder is partially contracted. Limited visualization of the gallbladder due to technique but no apparent wall thickening. There is a filling defect within the gallbladder likely representing a gallstone. The intraluminal gas seen at CT is not appreciated on MRI. Pancreas: No focal pancreatic lesion or ductal dilatation identified. Spleen: Spleen size is normal. The spleen has a diffusely heterogeneous appearance with tiny nodular pattern demonstrated particularly on T2 weighted imaging. This may reflect changes due to iron overload or may indicate an infectious process. No specific focal lesions are identified. Adrenals/Urinary Tract: No adrenal gland nodules. Bilateral renal cysts. No hydronephrosis or hydroureter. Stomach/Bowel: Visualized portions within the abdomen are unremarkable. Vascular/Lymphatic: Infrarenal aortic ectasia with maximal AP diameter of about 3.4 cm. No significant lymphadenopathy. Other: No free intra-abdominal fluid is demonstrated. Mild edema in the dependent  soft tissues. Musculoskeletal: Degenerative changes and scoliosis of the lumbar spine. Postoperative changes in the lower lumbar spine. Multiple areas of central canal stenosis are suggested, most severe at the L5-S1 level. IMPRESSION: 1. Cholelithiasis and choledocholithiasis with  associated bile duct dilatation. 2. Diffuse low signal intensity throughout the liver consistent with iron deposition. Probable diffuse fatty infiltration as well. 3. Heterogeneous appearance of the spleen may be related iron deposition or could indicate infectious process. No focal lesions. 4. Bilateral renal cysts. 5. Dilated abdominal aorta with maximal AP diameter 3.4 cm. Recommend followup by ultrasound in 3 years. This recommendation follows ACR consensus guidelines: White Paper of the ACR Incidental Findings Committee II on Vascular Findings. J Am Coll Radiol 2013; 32:951-884 6. Degenerative changes throughout the lumbar spine with areas of central canal stenosis, most prominent at the L5-S1 level. Electronically Signed   By: Lucienne Capers M.D.   On: 09/04/2017 23:06   Mr 3d Recon At Scanner  Result Date: 09/04/2017 CLINICAL DATA:  Elevated liver function studies. Gas in the gallbladder on CT. Common duct stones seen on CT. Patient has dementia and unable to hold breath. EXAM: MRI ABDOMEN WITHOUT CONTRAST  (INCLUDING MRCP) TECHNIQUE: Multiplanar multisequence MR imaging of the abdomen was performed. Heavily T2-weighted images of the biliary and pancreatic ducts were obtained, and three-dimensional MRCP images were rendered by post processing. COMPARISON:  CT abdomen and pelvis 09/04/2017 FINDINGS: Examination is technically limited due to motion artifact. Lower chest: There appear to be minimal bilateral pleural effusions. Hepatobiliary: No focal liver lesions are demonstrated. There is diffuse low signal intensity throughout the liver particularly on T2 weighted imaging. This suggests iron deposition. Apparent signal loss in the liver on out of phase imaging suggesting diffuse fatty infiltration as well. Diffuse intra and extrahepatic bile duct dilatation. Multiple filling defects seen in the mid and distal common bile duct consistent with choledocholithiasis. Gallbladder is partially contracted.  Limited visualization of the gallbladder due to technique but no apparent wall thickening. There is a filling defect within the gallbladder likely representing a gallstone. The intraluminal gas seen at CT is not appreciated on MRI. Pancreas: No focal pancreatic lesion or ductal dilatation identified. Spleen: Spleen size is normal. The spleen has a diffusely heterogeneous appearance with tiny nodular pattern demonstrated particularly on T2 weighted imaging. This may reflect changes due to iron overload or may indicate an infectious process. No specific focal lesions are identified. Adrenals/Urinary Tract: No adrenal gland nodules. Bilateral renal cysts. No hydronephrosis or hydroureter. Stomach/Bowel: Visualized portions within the abdomen are unremarkable. Vascular/Lymphatic: Infrarenal aortic ectasia with maximal AP diameter of about 3.4 cm. No significant lymphadenopathy. Other: No free intra-abdominal fluid is demonstrated. Mild edema in the dependent soft tissues. Musculoskeletal: Degenerative changes and scoliosis of the lumbar spine. Postoperative changes in the lower lumbar spine. Multiple areas of central canal stenosis are suggested, most severe at the L5-S1 level. IMPRESSION: 1. Cholelithiasis and choledocholithiasis with associated bile duct dilatation. 2. Diffuse low signal intensity throughout the liver consistent with iron deposition. Probable diffuse fatty infiltration as well. 3. Heterogeneous appearance of the spleen may be related iron deposition or could indicate infectious process. No focal lesions. 4. Bilateral renal cysts. 5. Dilated abdominal aorta with maximal AP diameter 3.4 cm. Recommend followup by ultrasound in 3 years. This recommendation follows ACR consensus guidelines: White Paper of the ACR Incidental Findings Committee II on Vascular Findings. J Am Coll Radiol 2013; 16:606-301 6. Degenerative changes throughout the lumbar spine with areas of central canal  stenosis, most prominent at  the L5-S1 level. Electronically Signed   By: Lucienne Capers M.D.   On: 09/04/2017 23:06   Dg Ercp  Result Date: 09/06/2017 CLINICAL DATA:  ERCP with sphincterotomy, balloon occlusion cholangiogram and CBD stones/sludge removal. EXAM: ERCP TECHNIQUE: Multiple spot images obtained with the fluoroscopic device and submitted for interpretation post-procedure. COMPARISON:  MRCP - 09/04/2017; CT abdomen pelvis -09/04/2017 FLUOROSCOPY TIME:  7 minutes, 8 seconds FINDINGS: Ten spot intraoperative fluoroscopic images of the right upper abdominal quadrant during ERCP are provided for review Initial image demonstrates an ERCP probe overlying the right upper abdominal quadrant. There is a suspected cannulation of the pancreatic duct. Subsequent images demonstrate selective cannulation and opacification of the common bile duct with several persistent filling defects seen within the CBD suggestive of choledocholithiasis demonstrated on preceding MRCP. Subsequent images demonstrate insufflation of a balloon within the central / mid aspect of the CBD with subsequent presumed sweeping and sphincterotomy. The common bile duct appears mildly dilated. There is minimal opacification of the central aspect of the intrahepatic biliary tree which appears very mildly dilated. There is no definitive opacification of the cystic or pancreatic ducts. IMPRESSION: ERCP with findings of choledocholithiasis with subsequent balloon sweeping and presumed sphincterotomy as detailed above. These images were submitted for radiologic interpretation only. Please see the procedural report for the amount of contrast and the fluoroscopy time utilized. Electronically Signed   By: Sandi Mariscal M.D.   On: 09/06/2017 13:37    Time Spent in minutes  25 minutes   Marcha Licklider M.D on 09/06/2017 at 2:56 PM  Between 7am to 7pm - Pager - [phone]  After 7pm go to [email or web address] - password [facility]  [facility] -  Office  [phone]

## 2017-09-06 NOTE — H&P (View-Only) (Signed)
Referring Provider: Dr. Waldron Labs Primary Care Physician:  Marin Olp, MD Primary Gastroenterologist:  Dr. Fuller Plan  Reason for Consultation:  CBD stones  HPI: Alexander Rice is a 83 y.o. male with medical history significant of MDS (follows with Dr. Marin Olp), hypertension, hyperlipidemia, stroke, CAD s/p of stent placement, BPH, anemia, Parkinson's disease, CKD-3, who presents with low hemoglobin, generalized weakness and fever.  Per pt's daughter, pt felt weak and had fever of 102.  Patient denies nausea, vomiting, diarrhea or, abdominal pain.  Pt was seen by PCP and was found to have hemoglobin 7.0 grams and yellow skin.  ED Course: pt was found to have hemoglobin 5.2 grams (baseline hemoglobin 8.0 grams), abnormal liver function with ALP 213, AST 249, and ALT 322 and total bilirubin 10, WBC 9.7, negative FOBT, alcohol level less than 10, lactic acid 1.37, INR 1.3, worsening renal function, sodium 122, temperature normal, no tachycardia, no tachypnea, chest x-ray negative. Patient is admitted to telemetry  Gen. Surgeon, Dr. Marlou Starks and GI, Dr. Silverio Decamp were consulted.  CT abdomen/pelvis showed:  1. Left lower lobe atelectasis. Extensive coronary calcification 2. Biliary dilatation. Soft tissue density within the common bile duct may represent gallstones or possibly tumor. In addition, there is gas within the gallbladder fundus. This may be related to recently passed gallstones or emphysematous cholecystitis. However, the gallbladder does not appear to be acutely inflamed or thickened at this time. 3. 10 x 20 mm calcification inferior to the right lobe liver appears nonacute without surrounding edema however this was not present on the prior study 4. Atherosclerotic disease with abdominal aortic aneurysm 35 mm   MRCP showed cholelithiasis choledocholithiasis with associated bile duct dilatation.  LFT's down-trending slightly today with total bili 8.2.    Past Medical  History:  Diagnosis Date  . Adenomatous colon polyp 02/1996, 02/2011   TA polyp 02/2011  . Anemia in chronic renal disease 11/02/2011  . B12 deficiency   . BPH (benign prostatic hyperplasia)   . CAD (coronary artery disease)   . CVD (cardiovascular disease)   . Dementia   . Diverticulosis   . Esophageal stricture   . GERD (gastroesophageal reflux disease)   . Glaucoma   . Heart palpitations   . Hemorrhoids   . Hiatal hernia   . History of colonic polyps 09/29/2006   Polyps age 63, Dr. Fuller Plan stated no further colonoscopy due to age. Confirmed with daughter given alzheimers, CAD history    . Hyperlipidemia   . Hypertension   . MDS (myelodysplastic syndrome) ([place])     Past Surgical History:  Procedure Laterality Date  . CAROTID ENDARTERECTOMY    . COLONOSCOPY    . GLAUCOMA SURGERY Bilateral 11/19/12   pt's report  . INGUINAL HERNIA REPAIR     right side/ twice  . PTCA     stent  . QUADRICEPS REPAIR     muscle attachment  . TONSILLECTOMY      Prior to Admission medications   Medication Sig Start Date End Date Taking? Authorizing Provider  aspirin 81 MG tablet Take 81 mg by mouth daily.     Yes [provider]  carbidopa-levodopa (SINEMET IR) 25-100 MG tablet Take 1/2 tablet three times a day Patient taking differently: Take 0.5 tablets by mouth 3 (three) times daily. Take 1/2 tablet three times a day 07/08/17  Yes Cameron Sprang, MD  [facility] OMEGA-3 KRILL OIL PO Take 350 mg by mouth daily. Reported on 01/16/2016   Yes [provider]  cyanocobalamin 100 MCG tablet Take 100 mcg by mouth daily.    Yes [provider]  dorzolamide (TRUSOPT) 2 % ophthalmic solution Place 1 drop into both eyes 2 (two) times daily.     Yes [provider]  doxazosin (CARDURA) 4 MG tablet TAKE 1 TABLET BY MOUTH AT  BEDTIME 11/20/16  Yes Marin Olp, MD  lisinopril (PRINIVIL,ZESTRIL) 5 MG tablet TAKE 1 TABLET BY MOUTH  DAILY 05/14/17  Yes Marin Olp, MD    LUMIGAN 0.01 % SOLN Place 1 drop into both eyes at bedtime.  03/23/11  Yes [provider]  memantine (NAMENDA) 10 MG tablet TAKE 1 TABLET BY MOUTH TWO  TIMES DAILY 05/01/17  Yes Cameron Sprang, MD  Multiple Vitamin (MULTIVITAMIN) tablet Take 1 tablet by mouth daily.     Yes [provider]  omeprazole (PRILOSEC) 20 MG capsule TAKE 1 CAPSULE BY MOUTH  DAILY 03/26/17  Yes Marin Olp, MD  Probiotic Product ([facility] PROBIOTIC) CHEW Chew 1 tablet by mouth daily.    Yes [provider]  Pyridoxine HCl (VITAMIN B-6) 250 MG tablet Take 1 tablet (250 mg total) by mouth daily. 05/17/15  Yes [name], MD  simvastatin (ZOCOR) 40 MG tablet TAKE 1 TABLET BY MOUTH  DAILY AT 6 PM 03/26/17  Yes Marin Olp, MD  epoetin alfa (PROCRIT) 92426 UNIT/ML injection Inject 1 mL (40,000 Units total) into the skin every 21 ( twenty-one) days. As needed for Hgb < 10g/dl 09/04/17   [place], Holli Humbles, NP    Current Facility-Administered Medications  Medication Dose Route Frequency Provider Last Rate Last Dose  . 0.9 %  sodium chloride infusion   Intravenous Continuous Ivor Costa, MD 100 mL/hr at 09/05/17 0154    . acidophilus (RISAQUAD) capsule 1 capsule  1 capsule Oral Daily Ivor Costa, MD      . aspirin EC tablet 81 mg  81 mg Oral Daily Ivor Costa, MD      . carbidopa-levodopa (SINEMET IR) 25-100 MG per tablet immediate release 0.5 tablet  0.5 tablet Oral TID Ivor Costa, MD   0.5 tablet at 09/05/17 0034  . dorzolamide (TRUSOPT) 2 % ophthalmic solution 1 drop  1 drop Both Eyes BID Ivor Costa, MD   1 drop at 09/05/17 0038  . doxazosin (CARDURA) tablet 4 mg  4 mg Oral QHS Ivor Costa, MD   4 mg at 09/05/17 0034  . hydrALAZINE (APRESOLINE) injection 5 mg  5 mg Intravenous Q2H PRN Ivor Costa, MD      . ibuprofen (ADVIL,MOTRIN) tablet 200 mg  200 mg Oral Q6H PRN Ivor Costa, MD      . latanoprost (XALATAN) 0.005 % ophthalmic solution 1 drop  1 drop Both Eyes QHS Ivor Costa, MD   1 drop at  09/05/17 0032  . memantine (NAMENDA) tablet 10 mg  10 mg Oral BID Ivor Costa, MD   10 mg at 09/05/17 0034  . multivitamin with minerals tablet 1 tablet  1 tablet Oral Daily Ivor Costa, MD      . omega-3 acid ethyl esters (LOVAZA) capsule 1,000 mg  1,000 mg Oral Daily Ivor Costa, MD      . ondansetron [facility]) tablet 4 mg  4 mg Oral Q6H PRN Ivor Costa, MD       Or  . ondansetron (ZOFRAN) injection 4 mg  4 mg Intravenous Q6H PRN Ivor Costa, MD      . pantoprazole (PROTONIX) EC tablet  40 mg  40 mg Oral Daily Ivor Costa, MD      . piperacillin-tazobactam (ZOSYN) IVPB 3.375 g  3.375 g Intravenous Q8H Randa Spike, [place] at 09/05/17 0506  . pyridOXINE (VITAMIN B-6) tablet 250 mg  250 mg Oral Daily Ivor Costa, MD      . vitamin B-12 (CYANOCOBALAMIN) tablet 100 mcg  100 mcg Oral Daily Ivor Costa, MD      . zolpidem (AMBIEN) tablet 5 mg  5 mg Oral QHS PRN Ivor Costa, MD        Allergies as of 09/04/2017  . (No Known Allergies)    Family History  Problem Relation Age of Onset  . COPD Mother   . Alcohol abuse Father     Social History   Social History  . Marital status: Married    Spouse name: N/A  . Number of children: 3  . Years of education: N/A   Occupational History  . Retired Retired   Social History Main Topics  . Smoking status: Former Smoker    Quit date: 11/19/1968  . Smokeless tobacco: Never Used     Comment: never used tobacco  . Alcohol use 4.2 oz/week    7 Standard drinks or equivalent per week     Comment: Wine/Beer  . Drug use: No  . Sexual activity: Not on file   Other Topics Concern  . Not on file   Social History Narrative   Married (wife patient of Dr. Yong Channel as well)      Retired from [employer] logistics/support/administrative officer and [place]      Hobbies: Scientist, research (physical sciences) [employer], [employer] .    Review of Systems: ROS negative except as mentioned in HPI.  Physical Exam: Vital signs in last 24 hours: Temp:  [97.7 F (36.5 C)-98.9 F (37.2 C)] 98 F  (36.7 C) (10/18 0730) Pulse Rate:  [63-81] 67 (10/18 0730) Resp:  [15-19] 18 (10/18 0730) BP: (110-162)/(45-67) 142/67 (10/18 0730) SpO2:  [91 %-100 %] 99 % (10/18 0730) Weight:  [169 lb (76.7 kg)] 169 lb (76.7 kg) (10/17 2017) Last BM Date: 09/04/17 General:  Alert, Well-developed, well-nourished, pleasant and cooperative in NAD; pleasantly confused. Jaundice noted. Head:  Normocephalic and atraumatic. Eyes:  Scleral icterus noted. Ears:  Normal auditory acuity. Mouth:  No deformity or lesions.   Lungs:  Clear throughout to auscultation.  No wheezes, crackles, or rhonchi.  Heart:  Regular rate and rhythm; no murmurs, clicks, rubs, or gallops. Abdomen:  Soft, non-distended.  BS present.  Non-tender. Rectal:  Deferred  Msk:  Symmetrical without gross deformities. Pulses:  Normal pulses noted. Extremities:  Without clubbing or edema. Neurologic:  Alert; grossly normal neurologically, but very confused. Skin:  Intact without significant lesions or rashes. Psych:  Alert and cooperative. Normal mood and affect.  Intake/Output from previous day: 10/17 0701 - 10/18 0700 In: 2415 [P.O.:90; I.V.:410; Blood:315; IV Piggyback:1600] Out: -  Intake/Output this shift: Total I/O In: -  Out: 600 [Urine:600]  Lab Results:  Recent Labs  09/04/17 1220 09/04/17 2010 09/05/17 0535  WBC 10.0 9.7 6.8  HGB 7.1 Repeated and verified X2.* 5.2* 7.5*  HCT 20.5 Repeated and verified X2.* 14.5* 21.3*  PLT 308.0 312 251   BMET  Recent Labs  09/04/17 1220 09/05/17 0020 09/05/17 0535  NA 122* 123* 125*  K 4.7 4.5 4.2  CL 92* 98* 98*  CO2 21 17* 18*  GLUCOSE 102* 112* 105*  BUN 37* 33* 28*  CREATININE 1.61* 1.22 1.01  CALCIUM 8.4 7.7* 7.7*   LFT  Recent Labs  09/05/17 0535  PROT 5.0*  ALBUMIN 2.8*  AST 156*  ALT 64*  ALKPHOS 171*  BILITOT 8.2*   PT/INR  Recent Labs  09/04/17 1220  LABPROT 13.9*  INR 1.3*   Studies/Results: Dg Chest 2 View  Result Date:  09/04/2017 CLINICAL DATA:  Cough and fevers with increased confusion EXAM: CHEST  2 VIEW COMPARISON:  05/29/2016 FINDINGS: Cardiac shadow remains enlarged. The thoracic aorta is tortuous. Calcifications are noted. Elevation of left hemidiaphragm is again seen. Mild interstitial changes are noted. Mild bibasilar atelectatic changes are noted. No focal infiltrate is noted. IMPRESSION: Mild bibasilar atelectatic changes. Aortic Atherosclerosis (ICD10-170.0) Electronically Signed   By: Inez Catalina M.D.   On: 09/04/2017 15:22   Ct Abdomen Pelvis W Contrast  Result Date: 09/04/2017 CLINICAL DATA:  Abnormal LFT.  Jaundice EXAM: CT ABDOMEN AND PELVIS WITH CONTRAST TECHNIQUE: Multidetector CT imaging of the abdomen and pelvis was performed using the standard protocol following bolus administration of intravenous contrast. CONTRAST:  <See Chart> ISOVUE-300 IOPAMIDOL (ISOVUE-300) INJECTION 61% COMPARISON:  CT abdomen pelvis 04/01/2012 FINDINGS: Lower chest: Mild left lower lobe atelectasis. Extensive coronary calcification. Small left lower lobe lung nodule on the prior study difficult to see on today's study due to atelectasis Hepatobiliary: Biliary dilatation. Mild intrahepatic and extrahepatic biliary dilatation. Soft tissue density within the common bile duct distally may represent common bile duct stones. Gallbladder is contracted. There is gas in the fundus of the gallbladder. Gallbladder wall not significantly thickened. No focal liver mass. Pancreas: Negative Spleen: Multiple calcified granulomata.  Normal splenic size Adrenals/Urinary Tract: Multiple left renal cyst. No renal mass or obstruction. Urinary bladder normal Stomach/Bowel: Negative for bowel obstruction.  Negative for mass Vascular/Lymphatic: Extensive atherosclerotic calcification. Abdominal aortic aneurysm measuring up to 35 mm, measuring 32 mm previously. Reproductive: Mild prostate enlargement Other: 10 x 20 mm calcification medially inferior to  the right lobe of the liver. This was not present previously and appears separate from bowel. Possible fat necrosis. No surrounding inflammation to suggest acute process. Musculoskeletal: Scoliosis and extensive lumbar degenerative change. X stop device at L4-5. No acute skeletal abnormality. IMPRESSION: Left lower lobe atelectasis.  Extensive coronary calcification Biliary dilatation. Soft tissue density within the common bile duct may represent gallstones or possibly tumor. In addition, there is gas within the gallbladder fundus. This may be related to recently passed gallstones or emphysematous cholecystitis. However, the gallbladder does not appear to be acutely inflamed or thickened at this time. 10 x 20 mm calcification inferior to the right lobe liver appears nonacute without surrounding edema however this was not present on the prior study Atherosclerotic disease with abdominal aortic aneurysm 35 mm Recommend followup by ultrasound in 2 years. This recommendation follows ACR consensus guidelines: White Paper of the ACR Incidental Findings Committee II on Vascular Findings. J Am Coll Radiol 2013; 10:789-794. Electronically Signed   By: Franchot Gallo M.D.   On: 09/04/2017 19:16   Mr Abdomen Mrcp Wo Contrast  Result Date: 09/04/2017 CLINICAL DATA:  Elevated liver function studies. Gas in the gallbladder on CT. Common duct stones seen on CT. Patient has dementia and unable to hold breath. EXAM: MRI ABDOMEN WITHOUT CONTRAST  (INCLUDING MRCP) TECHNIQUE: Multiplanar multisequence MR imaging of the abdomen was performed. Heavily T2-weighted images of the biliary and pancreatic ducts were obtained, and three-dimensional MRCP images were rendered by post processing. COMPARISON:  CT abdomen and pelvis 09/04/2017  FINDINGS: Examination is technically limited due to motion artifact. Lower chest: There appear to be minimal bilateral pleural effusions. Hepatobiliary: No focal liver lesions are demonstrated. There is  diffuse low signal intensity throughout the liver particularly on T2 weighted imaging. This suggests iron deposition. Apparent signal loss in the liver on out of phase imaging suggesting diffuse fatty infiltration as well. Diffuse intra and extrahepatic bile duct dilatation. Multiple filling defects seen in the mid and distal common bile duct consistent with choledocholithiasis. Gallbladder is partially contracted. Limited visualization of the gallbladder due to technique but no apparent wall thickening. There is a filling defect within the gallbladder likely representing a gallstone. The intraluminal gas seen at CT is not appreciated on MRI. Pancreas: No focal pancreatic lesion or ductal dilatation identified. Spleen: Spleen size is normal. The spleen has a diffusely heterogeneous appearance with tiny nodular pattern demonstrated particularly on T2 weighted imaging. This may reflect changes due to iron overload or may indicate an infectious process. No specific focal lesions are identified. Adrenals/Urinary Tract: No adrenal gland nodules. Bilateral renal cysts. No hydronephrosis or hydroureter. Stomach/Bowel: Visualized portions within the abdomen are unremarkable. Vascular/Lymphatic: Infrarenal aortic ectasia with maximal AP diameter of about 3.4 cm. No significant lymphadenopathy. Other: No free intra-abdominal fluid is demonstrated. Mild edema in the dependent soft tissues. Musculoskeletal: Degenerative changes and scoliosis of the lumbar spine. Postoperative changes in the lower lumbar spine. Multiple areas of central canal stenosis are suggested, most severe at the L5-S1 level. IMPRESSION: 1. Cholelithiasis and choledocholithiasis with associated bile duct dilatation. 2. Diffuse low signal intensity throughout the liver consistent with iron deposition. Probable diffuse fatty infiltration as well. 3. Heterogeneous appearance of the spleen may be related iron deposition or could indicate infectious process. No  focal lesions. 4. Bilateral renal cysts. 5. Dilated abdominal aorta with maximal AP diameter 3.4 cm. Recommend followup by ultrasound in 3 years. This recommendation follows ACR consensus guidelines: White Paper of the ACR Incidental Findings Committee II on Vascular Findings. J Am Coll Radiol 2013; 32:951-884 6. Degenerative changes throughout the lumbar spine with areas of central canal stenosis, most prominent at the L5-S1 level. Electronically Signed   By: Lucienne Capers M.D.   On: 09/04/2017 23:06   Mr 3d Recon At Scanner  Result Date: 09/04/2017 CLINICAL DATA:  Elevated liver function studies. Gas in the gallbladder on CT. Common duct stones seen on CT. Patient has dementia and unable to hold breath. EXAM: MRI ABDOMEN WITHOUT CONTRAST  (INCLUDING MRCP) TECHNIQUE: Multiplanar multisequence MR imaging of the abdomen was performed. Heavily T2-weighted images of the biliary and pancreatic ducts were obtained, and three-dimensional MRCP images were rendered by post processing. COMPARISON:  CT abdomen and pelvis 09/04/2017 FINDINGS: Examination is technically limited due to motion artifact. Lower chest: There appear to be minimal bilateral pleural effusions. Hepatobiliary: No focal liver lesions are demonstrated. There is diffuse low signal intensity throughout the liver particularly on T2 weighted imaging. This suggests iron deposition. Apparent signal loss in the liver on out of phase imaging suggesting diffuse fatty infiltration as well. Diffuse intra and extrahepatic bile duct dilatation. Multiple filling defects seen in the mid and distal common bile duct consistent with choledocholithiasis. Gallbladder is partially contracted. Limited visualization of the gallbladder due to technique but no apparent wall thickening. There is a filling defect within the gallbladder likely representing a gallstone. The intraluminal gas seen at CT is not appreciated on MRI. Pancreas: No focal pancreatic lesion or ductal  dilatation identified. Spleen: Spleen size  is normal. The spleen has a diffusely heterogeneous appearance with tiny nodular pattern demonstrated particularly on T2 weighted imaging. This may reflect changes due to iron overload or may indicate an infectious process. No specific focal lesions are identified. Adrenals/Urinary Tract: No adrenal gland nodules. Bilateral renal cysts. No hydronephrosis or hydroureter. Stomach/Bowel: Visualized portions within the abdomen are unremarkable. Vascular/Lymphatic: Infrarenal aortic ectasia with maximal AP diameter of about 3.4 cm. No significant lymphadenopathy. Other: No free intra-abdominal fluid is demonstrated. Mild edema in the dependent soft tissues. Musculoskeletal: Degenerative changes and scoliosis of the lumbar spine. Postoperative changes in the lower lumbar spine. Multiple areas of central canal stenosis are suggested, most severe at the L5-S1 level. IMPRESSION: 1. Cholelithiasis and choledocholithiasis with associated bile duct dilatation. 2. Diffuse low signal intensity throughout the liver consistent with iron deposition. Probable diffuse fatty infiltration as well. 3. Heterogeneous appearance of the spleen may be related iron deposition or could indicate infectious process. No focal lesions. 4. Bilateral renal cysts. 5. Dilated abdominal aorta with maximal AP diameter 3.4 cm. Recommend followup by ultrasound in 3 years. This recommendation follows ACR consensus guidelines: White Paper of the ACR Incidental Findings Committee II on Vascular Findings. J Am Coll Radiol 2013; 62:563-893 6. Degenerative changes throughout the lumbar spine with areas of central canal stenosis, most prominent at the L5-S1 level. Electronically Signed   By: Lucienne Capers M.D.   On: 09/04/2017 23:06   IMPRESSION:  *83 year old male with obstructive jaundice.  LFT's normal 2 weeks ago.  No abdominal pain, but was weak and having fevers.  MRCP shows CBD stones with dilation.  On  Zosyn. *MDS with anemia:  Receiving 3 units PRBC's.  FOBT negative.  Follows with Dr. Marin Olp.  PLAN: *Continue antibiotics.  *Will plan for ERCP on 10/19 at 12:30 pm.  Will speak with his daughter about his procedure since patient will not be able to sign consent. *Surgery already seeing the patient as well. *Trend LFT's.  ZEHR, JESSICA D.  09/05/2017, 8:59 AM  Pager number [phone]   ________________________________________________________________________  Velora Heckler GI MD note:  I personally examined the patient, reviewed the data and agree with the assessment and plan described above.  Biliary obstruction from pretty obvious stones in CBD on CT and MRCP.  PLanning for ERCP tomorrow.  Will stay on IV abx for now.   Owens Loffler, MD [facility] Pager [phone]

## 2017-09-06 NOTE — Transfer of Care (Signed)
Immediate Anesthesia Transfer of Care Note  Patient: Alexander Rice  Procedure(s) Performed: Procedure(s): ENDOSCOPIC RETROGRADE CHOLANGIOPANCREATOGRAPHY (ERCP) (N/A)  Patient Location: ENDO  Anesthesia Type:General  Level of Consciousness:  sedated, patient cooperative and responds to stimulation  Airway & Oxygen Therapy:Patient Spontanous Breathing and Patient connected to face mask oxgen  Post-op Assessment:  Report given to ENDO RN and Post -op Vital signs reviewed and stable  Post vital signs:  Reviewed and stable  Last Vitals:  Vitals:   09/06/17 1134 09/06/17 1340  BP: (!) 152/62 (!) 111/41  Pulse: 64 62  Resp: 14 13  Temp: 37 C   SpO2: 40% 102%    Complications: No apparent anesthesia complications

## 2017-09-06 NOTE — Progress Notes (Signed)
Day of Surgery    CC:  weakness  Subjective: Sleeping and comfortable. I did not wake him.  Objective: Vital signs in last 24 hours: Temp:  [98 F (36.7 C)-98.6 F (37 C)] 98 F (36.7 C) (10/19 0539) Pulse Rate:  [66-69] 69 (10/19 0539) Resp:  [18-20] 18 (10/19 0539) BP: (108-174)/(43-86) 108/86 (10/19 0539) SpO2:  [98 %-99 %] 98 % (10/19 0539) Last BM Date: 09/04/17 300 PO 630 blood 150 IV 4575 urine No BM Afebrile, VSS T bil down to 4.3, Na slightly better 127 H/H up to 9.8  - it looks like he has had 3 units of PRBC yesterday   Intake/Output from previous day: 10/18 0701 - 10/19 0700 In: 8341 [P.O.:300; I.V.:10; Blood:630; IV Piggyback:150] Out: 9622 [Urine:4575] Intake/Output this shift: No intake/output data recorded.  General appearance: sleeping and comfortable  Lab Results:   Recent Labs  09/05/17 1540 09/06/17 0513  WBC 6.4 5.1  HGB 9.9* 9.8*  HCT 27.9* 27.5*  PLT 279 287    BMET  Recent Labs  09/05/17 0535 09/06/17 0513  NA 125* 127*  K 4.2 4.3  CL 98* 100*  CO2 18* 19*  GLUCOSE 105* 94  BUN 28* 20  CREATININE 1.01 0.95  CALCIUM 7.7* 8.1*   PT/INR  Recent Labs  09/04/17 1220  LABPROT 13.9*  INR 1.3*     Recent Labs Lab 09/04/17 1220 09/05/17 0535 09/06/17 0513  AST 249* 156* 97*  ALT 322* 64* 85*  ALKPHOS 213* 171* 155*  BILITOT 10.0* 8.2* 4.3*  PROT 5.4* 5.0* 5.2*  ALBUMIN 3.7 2.8* 2.9*     Lipase     Component Value Date/Time   LIPASE 33 03/22/2015 1656     Medications: . acidophilus  1 capsule Oral Daily  . amLODipine  5 mg Oral Daily  . carbidopa-levodopa  0.5 tablet Oral TID  . dorzolamide  1 drop Both Eyes BID  . doxazosin  4 mg Oral QHS  . latanoprost  1 drop Both Eyes QHS  . memantine  10 mg Oral BID  . multivitamin with minerals  1 tablet Oral Daily  . pantoprazole  40 mg Oral Daily  . vitamin B-6  250 mg Oral Daily  . cyanocobalamin  100 mcg Oral Daily   . sodium chloride    .  piperacillin-tazobactam (ZOSYN)  IV 3.375 g (09/06/17 0948)   Anti-infectives    Start     Dose/Rate Route Frequency Ordered Stop   09/05/17 0200  piperacillin-tazobactam (ZOSYN) IVPB 3.375 g     3.375 g 12.5 mL/hr over 240 Minutes Intravenous Every 8 hours 09/04/17 2158     09/04/17 2000  piperacillin-tazobactam (ZOSYN) IVPB 3.375 g     3.375 g 100 mL/hr over 30 Minutes Intravenous  Once 09/04/17 1956 09/04/17 2114      Assessment/Plan Abdominal pain Cholelithiasis/choledocholithiasis Severe anemia H/H= 5.2/14.5 Hyponatremia  Dementia Hypertension Chronic kidney disease GERD FEN: IV fluids/NPO ID:  Zosyn 10/17 =>>day 3 DVT:  SCD's ordered    Plan:  For ERCP today, if medically stable we would aim to do cholecystectomy tomorrow if everyone decides to go with surgical option.  LOS: 2 days    JENNINGS,WILLARD 09/06/2017 [phone]  Agree with above.  He tolerated the ERCP well today.  He had a lot of debri in the CBD. Discussed with Dr. Rande Brunt He thinks that he will benefit from a cholecystectomy because of the large stone burden.  Jashan Cotten, daughter [phone]) at bedside.  I reviewed with her the findings.  The patient has memory issues and dementia and can not understand what is going on.  I discussed with the daughter the indications and risks of gall bladder surgery.  The primary risks of gall bladder surgery include, but are not limited to, bleeding, infection, common bile duct injury, and open surgery.    We discussed the typical post-operative recovery course. I tried to answer the family's questions.  Plan lap chole tomorrow AM.  Alphonsa Overall, MD, [facility] Pager: [phone] Office phone:  [phone]

## 2017-09-06 NOTE — Interval H&P Note (Signed)
History and Physical Interval Note:  09/06/2017 12:04 PM  Alexander Rice  has presented today for surgery, with the diagnosis of CBD stones  The various methods of treatment have been discussed with the patient and family. After consideration of risks, benefits and other options for treatment, the patient has consented to  Procedure(s): ENDOSCOPIC RETROGRADE CHOLANGIOPANCREATOGRAPHY (ERCP) (N/A) as a surgical intervention .  The patient's history has been reviewed, patient examined, no change in status, stable for surgery.  I have reviewed the patient's chart and labs.  Questions were answered to the patient's satisfaction.     Milus Banister

## 2017-09-06 NOTE — Anesthesia Procedure Notes (Signed)
Procedure Name: Intubation Date/Time: 09/06/2017 12:20 PM Performed by: Montel Clock Pre-anesthesia Checklist: Patient identified, Emergency Drugs available, Suction available, Patient being monitored and Timeout performed Patient Re-evaluated:Patient Re-evaluated prior to induction Oxygen Delivery Method: Circle system utilized Preoxygenation: Pre-oxygenation with 100% oxygen Induction Type: IV induction Ventilation: Oral airway inserted - appropriate to patient size and Two handed mask ventilation required Laryngoscope Size: Mac and 3 Grade View: Grade I Tube type: Oral Tube size: 7.5 mm Number of attempts: 1 Airway Equipment and Method: Stylet Placement Confirmation: ETT inserted through vocal cords under direct vision,  positive ETCO2 and breath sounds checked- equal and bilateral Secured at: 23 cm Tube secured with: Tape Dental Injury: Teeth and Oropharynx as per pre-operative assessment  Comments: 2 hand mask related to large nose and loose cheeks to attain seal. Easy with OA and 2nd hand.

## 2017-09-06 NOTE — Op Note (Signed)
[facility] Patient Name: Alexander Rice Procedure Date: 09/06/2017 MRN: [id number] Attending MD: Milus Banister , MD Date of Birth: [birth year] CSN: [id number] Age: 83 Admit Type: Inpatient Procedure:                ERCP Indications:              Bile duct stone(s) Providers:                Milus Banister, MD, Elmer Ramp. Tilden Dome, RN, Cletis Athens, Technician, Adair Laundry, CRNA Referring MD:              Medicines:                General Anesthesia, Indomethacin 100 mg PR, zosyn IV Complications:            No immediate complications. Estimated blood loss:                            None Estimated Blood Loss:     Estimated blood loss: none. Procedure:                Pre-Anesthesia Assessment:                           - Prior to the procedure, a History and Physical                            was performed, and patient medications and                            allergies were reviewed. The patient's tolerance of                            previous anesthesia was also reviewed. The risks                            and benefits of the procedure and the sedation                            options and risks were discussed with the patient.                            All questions were answered, and informed consent                            was obtained. Prior Anticoagulants: The patient has                            taken no previous anticoagulant or antiplatelet                            agents. ASA Grade Assessment: IV - A patient with  severe systemic disease that is a constant threat                            to life. After reviewing the risks and benefits,                            the patient was deemed in satisfactory condition to                            undergo the procedure.                           After obtaining informed consent, the scope was                            passed under direct  vision. Throughout the                            procedure, the patient's blood pressure, pulse, and                            oxygen saturations were monitored continuously. The                            CH-[id number]) scope was introduced through                            the mouth, and used to inject contrast into and                            used to inject contrast into the bile duct. The                            ERCP was accomplished without difficulty. The                            patient tolerated the procedure well. Scope In: Scope Out: Findings:      The scout film was normal. The esophagus was successfully intubated       under direct vision. The scope was advanced to a normal major papilla in       the descending duodenum without detailed examination of the pharynx,       larynx and associated structures, and upper GI tract. The upper GI tract       was grossly normal. Using a 44 [employer] over a .035 hydrawire I       cannulated the bile duct. An identical wire was temporarily placed into       the main pancreatic duct to facilitate biliary cannulation. Contrast was       injected into the bile duct revealing a diffusely dilated biliary tree       (CBD/CHD up to 15-10mm) with numerous medium to large mobile filling       defects. An adequate biliary sphincterotomy was performed. This revealed       benign appearing, soft, papillary mucosa in the distal duct which  I did       not biopsy. Using sequentially larger retreival balloons (49mm, then       65mm, then up to 16.66mm) I completely cleared the bile duct of numerous       soft brown stones. The 31mm balloon passed through the sphincterotomy       well, however the larger balloons required slight deflation to pass. A       completion, occlusion cholangiogram using the largest balloon (inlated       to 16.62mm) showed no retained stones. The main pancreatic duct was never       injected with dye. Impression:                - Extensive choledocholithiasis was found. Complete                            removal was accomplished by biliary sphincterotomy                            and balloon extraction. Moderate Sedation:      N/A- Per Anesthesia Care Recommendation:           - Return patient to hospital ward for ongoing care.                           - Given the large stone burden in the bile duct, I                            recommend proceeding with lap chole tomorrow. Procedure Code(s):        --- Professional ---                           [phone], Endoscopic retrograde                            cholangiopancreatography (ERCP); with removal of                            calculi/debris from biliary/pancreatic duct(s)                           [id number], Endoscopic retrograde                            cholangiopancreatography (ERCP); with                            sphincterotomy/papillotomy Diagnosis Code(s):        --- Professional ---                           K80.50, Calculus of bile duct without cholangitis                            or cholecystitis without obstruction CPT copyright 2016 [employer]. All rights reserved. The codes documented in this report are preliminary and upon coder review may  be revised to meet current compliance requirements. Milus Banister, MD 09/06/2017 1:33:49 PM  This report has been signed electronically. Number of Addenda: 0

## 2017-09-06 NOTE — Anesthesia Preprocedure Evaluation (Addendum)
Anesthesia Evaluation  Patient identified by MRN, date of birth, ID band Patient awake    Reviewed: Allergy & Precautions, NPO status , Patient's Chart, lab work & pertinent test results  Airway Mallampati: II  TM Distance: >3 FB Neck ROM: Full    Dental no notable dental hx.    Pulmonary neg pulmonary ROS, former smoker,    Pulmonary exam normal breath sounds clear to auscultation       Cardiovascular hypertension, + Peripheral Vascular Disease  Normal cardiovascular exam Rhythm:Regular Rate:Normal     Neuro/Psych dementia negative psych ROS   GI/Hepatic Neg liver ROS, GERD  ,  Endo/Other  negative endocrine ROS  Renal/GU Renal InsufficiencyRenal disease  negative genitourinary   Musculoskeletal negative musculoskeletal ROS (+)   Abdominal   Peds negative pediatric ROS (+)  Hematology negative hematology ROS (+)   Anesthesia Other Findings   Reproductive/Obstetrics negative OB ROS                            Anesthesia Physical Anesthesia Plan  ASA: III  Anesthesia Plan: General   Post-op Pain Management:    Induction: Intravenous  PONV Risk Score and Plan: 1 and Ondansetron, Dexamethasone and Treatment may vary due to age or medical condition  Airway Management Planned: Oral ETT  Additional Equipment:   Intra-op Plan:   Post-operative Plan: Extubation in OR  Informed Consent: I have reviewed the patients History and Physical, chart, labs and discussed the procedure including the risks, benefits and alternatives for the proposed anesthesia with the patient or authorized representative who has indicated his/her understanding and acceptance.   Dental advisory given  Plan Discussed with: CRNA and Surgeon  Anesthesia Plan Comments:         Anesthesia Quick Evaluation

## 2017-09-07 ENCOUNTER — Inpatient Hospital Stay (HOSPITAL_COMMUNITY): Payer: Medicare Other

## 2017-09-07 ENCOUNTER — Encounter (HOSPITAL_COMMUNITY)
Admission: EM | Disposition: A | Discharge status: Home Health | Payer: Self-pay | Source: Home / Self Care | Attending: Internal Medicine

## 2017-09-07 ENCOUNTER — Inpatient Hospital Stay (HOSPITAL_COMMUNITY): Payer: Medicare Other | Admitting: Certified Registered Nurse Anesthetist

## 2017-09-07 DIAGNOSIS — A419 Sepsis, unspecified organism: Secondary | ICD-10-CM

## 2017-09-07 DIAGNOSIS — K316 Fistula of stomach and duodenum: Secondary | ICD-10-CM

## 2017-09-07 DIAGNOSIS — I2510 Atherosclerotic heart disease of native coronary artery without angina pectoris: Secondary | ICD-10-CM

## 2017-09-07 HISTORY — PX: CHOLECYSTECTOMY: SHX55

## 2017-09-07 LAB — COMPREHENSIVE METABOLIC PANEL
ALBUMIN: 2.7 g/dL — AB (ref 3.5–5.0)
ALT: 51 U/L (ref 17–63)
AST: 58 U/L — AB (ref 15–41)
Alkaline Phosphatase: 139 U/L — ABNORMAL HIGH (ref 38–126)
Anion gap: 7 (ref 5–15)
BUN: 18 mg/dL (ref 6–20)
CHLORIDE: 101 mmol/L (ref 101–111)
CO2: 22 mmol/L (ref 22–32)
CREATININE: 0.98 mg/dL (ref 0.61–1.24)
Calcium: 8.4 mg/dL — ABNORMAL LOW (ref 8.9–10.3)
GFR calc Af Amer: >60 mL/min (ref >60)
GFR calc non Af Amer: >60 mL/min (ref >60)
GLUCOSE: 133 mg/dL — AB (ref 65–99)
POTASSIUM: 4.7 mmol/L (ref 3.5–5.1)
SODIUM: 130 mmol/L — AB (ref 135–145)
Total Bilirubin: 2.7 mg/dL — ABNORMAL HIGH (ref 0.3–1.2)
Total Protein: 5 g/dL — ABNORMAL LOW (ref 6.5–8.1)

## 2017-09-07 LAB — GLUCOSE, CAPILLARY
GLUCOSE-CAPILLARY: 117 mg/dL — AB (ref 65–99)
Glucose-Capillary: 97 mg/dL (ref 65–99)

## 2017-09-07 LAB — CBC
HEMATOCRIT: 27.2 % — AB (ref 39.0–52.0)
HEMOGLOBIN: 9.8 g/dL — AB (ref 13.0–17.0)
MCH: 33.3 pg (ref 26.0–34.0)
MCHC: 36 g/dL (ref 30.0–36.0)
MCV: 92.5 fL (ref 78.0–100.0)
PLATELETS: 275 10*3/uL (ref 150–400)
RBC: 2.94 MIL/uL — ABNORMAL LOW (ref 4.22–5.81)
RDW: 18.8 % — AB (ref 11.5–15.5)
WBC: 3.6 10*3/uL — ABNORMAL LOW (ref 4.0–10.5)

## 2017-09-07 LAB — SURGICAL PCR SCREEN
MRSA, PCR: NEGATIVE
Staphylococcus aureus: POSITIVE — AB

## 2017-09-07 LAB — LIPASE, BLOOD: LIPASE: 56 U/L — AB (ref 11–51)

## 2017-09-07 SURGERY — LAPAROSCOPIC CHOLECYSTECTOMY WITH INTRAOPERATIVE CHOLANGIOGRAM
Anesthesia: General | Site: Abdomen

## 2017-09-07 MED ORDER — ROCURONIUM BROMIDE 10 MG/ML (PF) SYRINGE
PREFILLED_SYRINGE | INTRAVENOUS | Status: DC | PRN
  Administered 2017-09-07: 20 mg via INTRAVENOUS
  Administered 2017-09-07: 10 mg via INTRAVENOUS

## 2017-09-07 MED ORDER — SUCCINYLCHOLINE CHLORIDE 200 MG/10ML IV SOSY
PREFILLED_SYRINGE | INTRAVENOUS | Status: DC | PRN
  Administered 2017-09-07: 100 mg via INTRAVENOUS

## 2017-09-07 MED ORDER — PROPOFOL 10 MG/ML IV BOLUS
INTRAVENOUS | Status: DC | PRN
  Administered 2017-09-07: 100 mg via INTRAVENOUS

## 2017-09-07 MED ORDER — ESMOLOL HCL 100 MG/10ML IV SOLN
INTRAVENOUS | Status: AC
  Filled 2017-09-07: qty 10

## 2017-09-07 MED ORDER — PROPOFOL 10 MG/ML IV BOLUS
INTRAVENOUS | Status: AC
  Filled 2017-09-07: qty 20

## 2017-09-07 MED ORDER — 0.9 % SODIUM CHLORIDE (POUR BTL) OPTIME
TOPICAL | Status: DC | PRN
  Administered 2017-09-07: 1000 mL

## 2017-09-07 MED ORDER — ONDANSETRON HCL 4 MG/2ML IJ SOLN
INTRAMUSCULAR | Status: DC | PRN
  Administered 2017-09-07: 4 mg via INTRAVENOUS

## 2017-09-07 MED ORDER — MUPIROCIN 2 % EX OINT
1.0000 "application " | TOPICAL_OINTMENT | Freq: Two times a day (BID) | CUTANEOUS | Status: AC
  Administered 2017-09-07 – 2017-09-11 (×9): 1 via NASAL
  Filled 2017-09-07 (×2): qty 22

## 2017-09-07 MED ORDER — ONDANSETRON HCL 4 MG/2ML IJ SOLN
INTRAMUSCULAR | Status: AC
  Filled 2017-09-07: qty 2

## 2017-09-07 MED ORDER — FENTANYL CITRATE (PF) 100 MCG/2ML IJ SOLN
INTRAMUSCULAR | Status: AC
  Filled 2017-09-07: qty 2

## 2017-09-07 MED ORDER — LACTATED RINGERS IV SOLN: INTRAVENOUS | Status: DC

## 2017-09-07 MED ORDER — FENTANYL CITRATE (PF) 100 MCG/2ML IJ SOLN: 25.0000 ug | INTRAMUSCULAR | Status: DC | PRN

## 2017-09-07 MED ORDER — LACTATED RINGERS IR SOLN
Status: DC | PRN
  Administered 2017-09-07: 1000 mL

## 2017-09-07 MED ORDER — LIDOCAINE 2% (20 MG/ML) 5 ML SYRINGE
INTRAMUSCULAR | Status: DC | PRN
  Administered 2017-09-07: 50 mg via INTRAVENOUS

## 2017-09-07 MED ORDER — BUPIVACAINE-EPINEPHRINE (PF) 0.25% -1:200000 IJ SOLN
INTRAMUSCULAR | Status: AC
  Filled 2017-09-07: qty 30

## 2017-09-07 MED ORDER — MORPHINE SULFATE (PF) 4 MG/ML IV SOLN: 1.0000 mg | INTRAVENOUS | Status: DC | PRN

## 2017-09-07 MED ORDER — SUGAMMADEX SODIUM 200 MG/2ML IV SOLN
INTRAVENOUS | Status: DC | PRN
  Administered 2017-09-07: 150 mg via INTRAVENOUS

## 2017-09-07 MED ORDER — BUPIVACAINE-EPINEPHRINE 0.25% -1:200000 IJ SOLN
INTRAMUSCULAR | Status: DC | PRN
  Administered 2017-09-07: 30 mL

## 2017-09-07 MED ORDER — ALBUMIN HUMAN 5 % IV SOLN
INTRAVENOUS | Status: DC | PRN
  Administered 2017-09-07: 09:00:00 via INTRAVENOUS

## 2017-09-07 MED ORDER — CHLORHEXIDINE GLUCONATE CLOTH 2 % EX PADS
6.0000 | MEDICATED_PAD | Freq: Every day | CUTANEOUS | Status: AC
  Administered 2017-09-08 – 2017-09-11 (×4): 6 via TOPICAL

## 2017-09-07 MED ORDER — LACTATED RINGERS IV SOLN
INTRAVENOUS | Status: DC | PRN
  Administered 2017-09-07 (×2): via INTRAVENOUS

## 2017-09-07 MED ORDER — IOPAMIDOL (ISOVUE-300) INJECTION 61%
INTRAVENOUS | Status: AC
  Filled 2017-09-07: qty 50

## 2017-09-07 MED ORDER — FENTANYL CITRATE (PF) 100 MCG/2ML IJ SOLN
INTRAMUSCULAR | Status: DC | PRN
  Administered 2017-09-07 (×2): 50 ug via INTRAVENOUS
  Administered 2017-09-07: 100 ug via INTRAVENOUS

## 2017-09-07 MED ORDER — SODIUM CHLORIDE 0.9 % IV SOLN
INTRAVENOUS | Status: DC | PRN
  Administered 2017-09-07: 100 mL

## 2017-09-07 SURGICAL SUPPLY — 48 items
APPLIER CLIP 5 13 M/L LIGAMAX5 (MISCELLANEOUS) ×3
APPLIER CLIP ROT 10 11.4 M/L (STAPLE) ×3
BENZOIN TINCTURE PRP APPL 2/3 (GAUZE/BANDAGES/DRESSINGS) IMPLANT
CABLE HIGH FREQUENCY MONO STRZ (ELECTRODE) ×3 IMPLANT
CHLORAPREP W/TINT 26ML (MISCELLANEOUS) ×3 IMPLANT
CHOLANGIOGRAM CATH TAUT (CATHETERS) ×3 IMPLANT
CLIP APPLIE 5 13 M/L LIGAMAX5 (MISCELLANEOUS) ×1 IMPLANT
CLIP APPLIE ROT 10 11.4 M/L (STAPLE) ×1 IMPLANT
CLOSURE WOUND 1/4X4 (GAUZE/BANDAGES/DRESSINGS)
COVER MAYO STAND STRL (DRAPES) ×3 IMPLANT
COVER SURGICAL LIGHT HANDLE (MISCELLANEOUS) ×3 IMPLANT
CUTTER FLEX LINEAR 45M (STAPLE) ×3 IMPLANT
DECANTER SPIKE VIAL GLASS SM (MISCELLANEOUS) ×3 IMPLANT
DERMABOND ADVANCED (GAUZE/BANDAGES/DRESSINGS) ×2
DERMABOND ADVANCED .7 DNX12 (GAUZE/BANDAGES/DRESSINGS) ×1 IMPLANT
DRAPE C-ARM 42X120 X-RAY (DRAPES) ×3 IMPLANT
ELECT REM PT RETURN 15FT ADLT (MISCELLANEOUS) ×3 IMPLANT
GLOVE BIOGEL PI IND STRL 8 (GLOVE) ×1 IMPLANT
GLOVE BIOGEL PI INDICATOR 8 (GLOVE) ×2
GLOVE ECLIPSE 8.0 STRL XLNG CF (GLOVE) ×3 IMPLANT
GLOVE SURG SIGNA 7.5 PF LTX (GLOVE) ×3 IMPLANT
GOWN STRL REUS W/TWL XL LVL3 (GOWN DISPOSABLE) ×9 IMPLANT
HEMOSTAT SURGICEL 4X8 (HEMOSTASIS) IMPLANT
IV CATH 14GX2 1/4 (CATHETERS) ×3 IMPLANT
IV SET EXTENSION CATH 6 NF (IV SETS) ×3 IMPLANT
KIT BASIN OR (CUSTOM PROCEDURE TRAY) ×3 IMPLANT
POUCH RETRIEVAL ECOSAC 10 (ENDOMECHANICALS) ×1 IMPLANT
POUCH RETRIEVAL ECOSAC 10MM (ENDOMECHANICALS) ×2
RELOAD STAPLE TA45 3.5 REG BLU (ENDOMECHANICALS) ×3 IMPLANT
SCISSORS LAP 5X35 DISP (ENDOMECHANICALS) ×3 IMPLANT
SLEEVE ADV FIXATION 5X100MM (TROCAR) ×3 IMPLANT
STOPCOCK 4 WAY LG BORE MALE ST (IV SETS) ×3 IMPLANT
STRIP CLOSURE SKIN 1/4X4 (GAUZE/BANDAGES/DRESSINGS) IMPLANT
SUT MNCRL AB 4-0 PS2 18 (SUTURE) ×6 IMPLANT
SUT VIC AB 2-0 SH 27 (SUTURE) ×4
SUT VIC AB 2-0 SH 27X BRD (SUTURE) ×2 IMPLANT
SUT VICRYL 0 UR6 27IN ABS (SUTURE) ×3 IMPLANT
SYR 10ML ECCENTRIC (SYRINGE) ×3 IMPLANT
SYR 50ML LL SCALE MARK (SYRINGE) ×3 IMPLANT
TOWEL OR 17X26 10 PK STRL BLUE (TOWEL DISPOSABLE) ×3 IMPLANT
TOWEL OR NON WOVEN STRL DISP B (DISPOSABLE) ×3 IMPLANT
TRAY LAPAROSCOPIC (CUSTOM PROCEDURE TRAY) ×3 IMPLANT
TROCAR ADV FIXATION 11X100MM (TROCAR) ×3 IMPLANT
TROCAR ADV FIXATION 5X100MM (TROCAR) ×6 IMPLANT
TROCAR XCEL BLUNT TIP 100MML (ENDOMECHANICALS) ×3 IMPLANT
TUBING CONNECTING 10 (TUBING) ×4 IMPLANT
TUBING CONNECTING 10' (TUBING) ×2
TUBING INSUF HEATED (TUBING) ×3 IMPLANT

## 2017-09-07 NOTE — Progress Notes (Signed)
PROGRESS NOTE                                                                                                                                                                                                             Patient Demographics:    Alexander Rice, is a 83 y.o. male, DOB - [birth year], VEH:[id number]  Admit date - 09/04/2017   Admitting Physician Ivor Costa, MD  Outpatient Primary MD for the patient is Yong Channel Brayton Mars, MD  LOS - 3   Chief Complaint  Patient presents with  . Weakness       Brief Narrative   83 y.o. male with medical history significant of MDS (follows with Dr. Marin Olp), hypertension, hyperlipidemia, stroke, CAD s/p of stent placement, BPH, anemia, Parkinson's disease, CKD-3, who presents with low hemoglobin, generalized weakness and fever.workup significant for profound anemia with hemoglobin of 5.2, and choledocholithiasis.transfused 2 units PRBC with good response, went for ERCP 09/06/2017 significant for with large stone burden, cleared with biliary sphincterotomy and balloon extraction.   Subjective:    Alexander Rice with dementia, he denies any headache, chest pain, abdominal pain. S/p cholecystectomy, so far well tolerated. No CP, no SOB.   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/2017 Active Problems:   Hyperlipidemia   Essential hypertension   CAD (coronary artery disease)   GERD   BPH (benign prostatic hyperplasia)   MDS (myelodysplastic syndrome) (HCC)   Pernicious anemia   Hyponatremia   Moderate dementia, without behavioral disturbance   Acute renal failure superimposed on stage 3 chronic kidney disease (HCC)   AAA (abdominal aortic aneurysm) (HCC)   Cholecystitis   Choledocholithiasis   Choledocholithiasis - MRCP significant for cholelithiasis and choledocholithiasis, with bile duct by dictation,he is a significantly  elevated bilirubin,indicative of obstructive jaundice, - GI input greatly appreciated, went for ERCP, significant for Extensive choledocholithiasis was found. almost complete removal was accomplished by biliary sphincterotomy and balloon extraction. - given the reported fever at home will continue with IV Zosyn for now.  - followed by surgery, S/P laparoscopy procedure.  symptomatic anemia - Patient with known history of MDS, pernicious anemia requiring transfusions in the past, Hemoccult-negative, transfused 3 units overnight good response, hemoglobin is 9.8 - Monitor CBC and transfuse as needed,  - f/u with  Dr. Niel Hummer -continue Cyanocobalamin when taking PO's  Hyperlipidemia  -holding statins due to elevated LFT's and NPO status.  HTN: -continue hydralazine while NPO -plan is to resume oral antihypertensive agents when taking PO's again.  CAD (coronary artery disease): - s/p of stent. No CP -given profound anemia will continue holding aspirin  -and also holding ACE given low BP and acute on CKD.  AKI in CK D stage III -Cr improved with IVF's -will follow clearance to advance diet. -follow renal function trend   GERD: -continue PPI when tolerating PO's  BPH with active urinary retention  -once able to resume PO will continue with Cardura -given 456 Ml urine retained will place foley for now.  Hyponatremia:  - appear to be chronic, but never this low -most likely associated with dehydration and GI loses -will replete as needed -continue monitoring on telemetry.  Moderate dementia, without behavioral disturbance: -continue nameneda when tolerating PO's again.  AAA (abdominal aortic aneurysm) ([place]): - incidental finding on imaging, atherosclerotic disease with abdominal aortic aneurysm 35 mm. -will need repeat ultrasound in 2 years and outpatient follow up.   Code Status : Full  Family Communication  : none at bedside  Disposition Plan  : Pending further  workup. Will determine best venue at discharge.  Consults  :  Gen syrgery, GI  Procedures  : 3 units PRBC 09/05/2017, ERCP 09/06/2017; laparoscopic cholecystectomy 10/20  DVT Prophylaxis  :  SCDs   Lab Results  Component Value Date   PLT 275 09/07/2017    Antibiotics  :    Anti-infectives    Start     Dose/Rate Route Frequency Ordered Stop   09/05/17 0200  piperacillin-tazobactam (ZOSYN) IVPB 3.375 g     3.375 g 12.5 mL/hr over 240 Minutes Intravenous Every 8 hours 09/04/17 2158     09/04/17 2000  piperacillin-tazobactam (ZOSYN) IVPB 3.375 g     3.375 g 100 mL/hr over 30 Minutes Intravenous  Once 09/04/17 1956 09/04/17 2114        Objective:   Vitals:   09/07/17 1000 09/07/17 1030 09/07/17 1045 09/07/17 2020  BP: (!) 175/68  (!) 160/73 (!) 172/60  Pulse: 68  72 66  Resp: 13  14 16   Temp:  (!) 97.4 F (36.3 C) (!) 97.5 F (36.4 C) 98.2 F (36.8 C)  TempSrc:   Oral Oral  SpO2: 99%  97% 96%  Weight:      Height:        Wt Readings from Last 3 Encounters:  09/06/17 76.7 kg (169 lb)  07/08/17 77.1 kg (170 lb)  06/17/17 76.7 kg (169 lb)     Intake/Output Summary (Last 24 hours) at 09/07/17 2338 Last data filed at 09/07/17 2200  Gross per 24 hour  Intake             4250 ml  Output             1820 ml  Net             2430 ml     Physical Exam  Seen and examined before ERCP GeN: AAOX2; no CP, no SOB. Wearing O2 supplementation 2L (place after surgery). Currently denying Abd pain, nausea and vomiting. positive urinary retention. Cardiovascular:S1 and S2, no JVD, no rubs or gallops Resp: no using accessory muscles, no tachypnea, no wheezing, no crackles. Abd: soft, no distension, appropriate level of discomfort around incision with palpation.   Data Review:    CBC  Recent Labs  Lab 09/04/17 1220 09/04/17 2010 09/05/17 0535 09/05/17 1540 09/06/17 0513 09/07/17 0534  WBC 10.0 9.7 6.8 6.4 5.1 3.6*  HGB 7.1 Repeated and verified X2.* 5.2* 7.5* 9.9*  9.8* 9.8*  HCT 20.5 Repeated and verified X2.* 14.5* 21.3* 27.9* 27.5* 27.2*  PLT 308.0 312 251 279 287 275  MCV 101.4* 94.8 94.7 92.1 92.3 92.5  MCH  --  34.0 33.3 32.7 32.9 33.3  MCHC 34.9 35.9 35.2 35.5 35.6 36.0  RDW 22.7* 23.0* 20.5* 19.2* 19.7* 18.8*  LYMPHSABS 0.4* 0.5*  --   --   --   --   MONOABS 0.5 0.8  --   --   --   --   EOSABS 0.0 0.1  --   --   --   --   BASOSABS 0.0 0.0  --   --   --   --     Chemistries   Recent Labs Lab 09/04/17 1220 09/05/17 0020 09/05/17 0535 09/06/17 0513 09/07/17 0534  NA 122* 123* 125* 127* 130*  K 4.7 4.5 4.2 4.3 4.7  CL 92* 98* 98* 100* 101  CO2 21 17* 18* 19* 22  GLUCOSE 102* 112* 105* 94 133*  BUN 37* 33* 28* 20 18  CREATININE 1.61* 1.22 1.01 0.95 0.98  CALCIUM 8.4 7.7* 7.7* 8.1* 8.4*  AST 249*  --  156* 97* 58*  ALT 322*  --  64* 85* 51  ALKPHOS 213*  --  171* 155* 139*  BILITOT 10.0*  --  8.2* 4.3* 2.7*    Recent Labs  09/05/17 0535  TSH 2.158   Coagulation profile  Recent Labs Lab 09/04/17 1220  INR 1.3*   ------------------------------------------------------------------------------------------------------------------ No results found for: BNP  Inpatient Medications  Scheduled Meds: . acidophilus  1 capsule Oral Daily  . amLODipine  5 mg Oral Daily  . carbidopa-levodopa  0.5 tablet Oral TID  . Chlorhexidine Gluconate Cloth  6 each Topical Daily  . dorzolamide  1 drop Both Eyes BID  . doxazosin  4 mg Oral QHS  . indomethacin  100 mg Rectal Once  . latanoprost  1 drop Both Eyes QHS  . memantine  10 mg Oral BID  . multivitamin with minerals  1 tablet Oral Daily  . mupirocin ointment  1 application Nasal BID  . pantoprazole  40 mg Oral Daily  . vitamin B-6  250 mg Oral Daily  . cyanocobalamin  100 mcg Oral Daily   Continuous Infusions: . dextrose 5 % and 0.45 % NaCl with KCl 20 mEq/L 100 mL/hr at 09/07/17 1255  . piperacillin-tazobactam (ZOSYN)  IV Stopped (09/07/17 2126)   PRN Meds:.hydrALAZINE,  morphine injection, ondansetron **OR** ondansetron (ZOFRAN) IV, zolpidem  Micro Results Recent Results (from the past 240 hour(s))  Blood Culture (routine x 2)     Status: None (Preliminary result)   Collection Time: 09/04/17  8:10 PM  Result Value Ref Range Status   Specimen Description BLOOD LEFT ANTECUBITAL  Final   Special Requests   Final    BOTTLES DRAWN AEROBIC AND ANAEROBIC Blood Culture adequate volume   Culture   Final    NO GROWTH 2 DAYS Performed at [facility], [place] [address] [place], [place] [address]    Report Status PENDING  Incomplete  Blood Culture (routine x 2)     Status: None (Preliminary result)   Collection Time: 09/04/17  8:10 PM  Result Value Ref Range Status   Specimen Description BLOOD RIGHT ANTECUBITAL  Final  Special Requests   Final    BOTTLES DRAWN AEROBIC AND ANAEROBIC Blood Culture adequate volume   Culture   Final    NO GROWTH 2 DAYS Performed at [facility], [place] [address] [place], [place] [address]    Report Status PENDING  Incomplete  Surgical pcr screen     Status: Abnormal   Collection Time: 09/06/17  9:18 PM  Result Value Ref Range Status   MRSA, PCR NEGATIVE NEGATIVE Final   Staphylococcus aureus POSITIVE (A) NEGATIVE Final    Comment: (NOTE) The Xpert SA Assay (FDA approved for NASAL specimens in patients 23 years of age and older), is one component of a comprehensive surveillance program. It is not intended to diagnose infection nor to guide or monitor treatment.     Radiology Reports Dg Chest 2 View  Result Date: 09/04/2017 CLINICAL DATA:  Cough and fevers with increased confusion EXAM: CHEST  2 VIEW COMPARISON:  05/29/2016 FINDINGS: Cardiac shadow remains enlarged. The thoracic aorta is tortuous. Calcifications are noted. Elevation of left hemidiaphragm is again seen. Mild interstitial changes are noted. Mild bibasilar atelectatic changes are noted. No focal infiltrate is noted. IMPRESSION: Mild bibasilar  atelectatic changes. Aortic Atherosclerosis (ICD10-170.0) Electronically Signed   By: Inez Catalina M.D.   On: 09/04/2017 15:22   Dg Cholangiogram Operative  Result Date: 09/07/2017 CLINICAL DATA:  Cholecystectomy, intraoperative imaging EXAM: INTRAOPERATIVE CHOLANGIOGRAM TECHNIQUE: Cholangiographic images from the C-arm fluoroscopic device were submitted for interpretation post-operatively. Please see the procedural report for the amount of contrast and the fluoroscopy time utilized. COMPARISON:  09/06/2017 FINDINGS: Intraoperative cholangiogram performed during the laparoscopic procedure. Injection of the cystic duct demonstrates diffuse biliary dilatation of the intrahepatic ducts, biliary confluence, common hepatic duct, and common bile duct. Irregular filling defect in the distal CBD may represent residual choledocholithiasis versus sludge or blood clot. Contrast does not freely drain into the duodenum. IMPRESSION: Distal CBD irregular ill-defined filling defect as above. Associated biliary dilatation and obstruction. Contrast does not freely flow into the duodenum. Electronically Signed   By: Jerilynn Mages.  Shick M.D.   On: 09/07/2017 09:50   Ct Abdomen Pelvis W Contrast  Result Date: 09/04/2017 CLINICAL DATA:  Abnormal LFT.  Jaundice EXAM: CT ABDOMEN AND PELVIS WITH CONTRAST TECHNIQUE: Multidetector CT imaging of the abdomen and pelvis was performed using the standard protocol following bolus administration of intravenous contrast. CONTRAST:  <See Chart> ISOVUE-300 IOPAMIDOL (ISOVUE-300) INJECTION 61% COMPARISON:  CT abdomen pelvis 04/01/2012 FINDINGS: Lower chest: Mild left lower lobe atelectasis. Extensive coronary calcification. Small left lower lobe lung nodule on the prior study difficult to see on today's study due to atelectasis Hepatobiliary: Biliary dilatation. Mild intrahepatic and extrahepatic biliary dilatation. Soft tissue density within the common bile duct distally may represent common bile duct  stones. Gallbladder is contracted. There is gas in the fundus of the gallbladder. Gallbladder wall not significantly thickened. No focal liver mass. Pancreas: Negative Spleen: Multiple calcified granulomata.  Normal splenic size Adrenals/Urinary Tract: Multiple left renal cyst. No renal mass or obstruction. Urinary bladder normal Stomach/Bowel: Negative for bowel obstruction.  Negative for mass Vascular/Lymphatic: Extensive atherosclerotic calcification. Abdominal aortic aneurysm measuring up to 35 mm, measuring 32 mm previously. Reproductive: Mild prostate enlargement Other: 10 x 20 mm calcification medially inferior to the right lobe of the liver. This was not present previously and appears separate from bowel. Possible fat necrosis. No surrounding inflammation to suggest acute process. Musculoskeletal: Scoliosis and extensive lumbar degenerative change. X stop device at L4-5. No  acute skeletal abnormality. IMPRESSION: Left lower lobe atelectasis.  Extensive coronary calcification Biliary dilatation. Soft tissue density within the common bile duct may represent gallstones or possibly tumor. In addition, there is gas within the gallbladder fundus. This may be related to recently passed gallstones or emphysematous cholecystitis. However, the gallbladder does not appear to be acutely inflamed or thickened at this time. 10 x 20 mm calcification inferior to the right lobe liver appears nonacute without surrounding edema however this was not present on the prior study Atherosclerotic disease with abdominal aortic aneurysm 35 mm Recommend followup by ultrasound in 2 years. This recommendation follows ACR consensus guidelines: White Paper of the ACR Incidental Findings Committee II on Vascular Findings. J Am Coll Radiol 2013; 10:789-794. Electronically Signed   By: Franchot Gallo M.D.   On: 09/04/2017 19:16   Mr Abdomen Mrcp Wo Contrast  Result Date: 09/04/2017 CLINICAL DATA:  Elevated liver function studies. Gas in  the gallbladder on CT. Common duct stones seen on CT. Patient has dementia and unable to hold breath. EXAM: MRI ABDOMEN WITHOUT CONTRAST  (INCLUDING MRCP) TECHNIQUE: Multiplanar multisequence MR imaging of the abdomen was performed. Heavily T2-weighted images of the biliary and pancreatic ducts were obtained, and three-dimensional MRCP images were rendered by post processing. COMPARISON:  CT abdomen and pelvis 09/04/2017 FINDINGS: Examination is technically limited due to motion artifact. Lower chest: There appear to be minimal bilateral pleural effusions. Hepatobiliary: No focal liver lesions are demonstrated. There is diffuse low signal intensity throughout the liver particularly on T2 weighted imaging. This suggests iron deposition. Apparent signal loss in the liver on out of phase imaging suggesting diffuse fatty infiltration as well. Diffuse intra and extrahepatic bile duct dilatation. Multiple filling defects seen in the mid and distal common bile duct consistent with choledocholithiasis. Gallbladder is partially contracted. Limited visualization of the gallbladder due to technique but no apparent wall thickening. There is a filling defect within the gallbladder likely representing a gallstone. The intraluminal gas seen at CT is not appreciated on MRI. Pancreas: No focal pancreatic lesion or ductal dilatation identified. Spleen: Spleen size is normal. The spleen has a diffusely heterogeneous appearance with tiny nodular pattern demonstrated particularly on T2 weighted imaging. This may reflect changes due to iron overload or may indicate an infectious process. No specific focal lesions are identified. Adrenals/Urinary Tract: No adrenal gland nodules. Bilateral renal cysts. No hydronephrosis or hydroureter. Stomach/Bowel: Visualized portions within the abdomen are unremarkable. Vascular/Lymphatic: Infrarenal aortic ectasia with maximal AP diameter of about 3.4 cm. No significant lymphadenopathy. Other: No free  intra-abdominal fluid is demonstrated. Mild edema in the dependent soft tissues. Musculoskeletal: Degenerative changes and scoliosis of the lumbar spine. Postoperative changes in the lower lumbar spine. Multiple areas of central canal stenosis are suggested, most severe at the L5-S1 level. IMPRESSION: 1. Cholelithiasis and choledocholithiasis with associated bile duct dilatation. 2. Diffuse low signal intensity throughout the liver consistent with iron deposition. Probable diffuse fatty infiltration as well. 3. Heterogeneous appearance of the spleen may be related iron deposition or could indicate infectious process. No focal lesions. 4. Bilateral renal cysts. 5. Dilated abdominal aorta with maximal AP diameter 3.4 cm. Recommend followup by ultrasound in 3 years. This recommendation follows ACR consensus guidelines: White Paper of the ACR Incidental Findings Committee II on Vascular Findings. J Am Coll Radiol 2013; 42:595-638 6. Degenerative changes throughout the lumbar spine with areas of central canal stenosis, most prominent at the L5-S1 level. Electronically Signed   By: Oren Beckmann.D.  On: 09/04/2017 23:06   Mr 3d Recon At Scanner  Result Date: 09/04/2017 CLINICAL DATA:  Elevated liver function studies. Gas in the gallbladder on CT. Common duct stones seen on CT. Patient has dementia and unable to hold breath. EXAM: MRI ABDOMEN WITHOUT CONTRAST  (INCLUDING MRCP) TECHNIQUE: Multiplanar multisequence MR imaging of the abdomen was performed. Heavily T2-weighted images of the biliary and pancreatic ducts were obtained, and three-dimensional MRCP images were rendered by post processing. COMPARISON:  CT abdomen and pelvis 09/04/2017 FINDINGS: Examination is technically limited due to motion artifact. Lower chest: There appear to be minimal bilateral pleural effusions. Hepatobiliary: No focal liver lesions are demonstrated. There is diffuse low signal intensity throughout the liver particularly on T2  weighted imaging. This suggests iron deposition. Apparent signal loss in the liver on out of phase imaging suggesting diffuse fatty infiltration as well. Diffuse intra and extrahepatic bile duct dilatation. Multiple filling defects seen in the mid and distal common bile duct consistent with choledocholithiasis. Gallbladder is partially contracted. Limited visualization of the gallbladder due to technique but no apparent wall thickening. There is a filling defect within the gallbladder likely representing a gallstone. The intraluminal gas seen at CT is not appreciated on MRI. Pancreas: No focal pancreatic lesion or ductal dilatation identified. Spleen: Spleen size is normal. The spleen has a diffusely heterogeneous appearance with tiny nodular pattern demonstrated particularly on T2 weighted imaging. This may reflect changes due to iron overload or may indicate an infectious process. No specific focal lesions are identified. Adrenals/Urinary Tract: No adrenal gland nodules. Bilateral renal cysts. No hydronephrosis or hydroureter. Stomach/Bowel: Visualized portions within the abdomen are unremarkable. Vascular/Lymphatic: Infrarenal aortic ectasia with maximal AP diameter of about 3.4 cm. No significant lymphadenopathy. Other: No free intra-abdominal fluid is demonstrated. Mild edema in the dependent soft tissues. Musculoskeletal: Degenerative changes and scoliosis of the lumbar spine. Postoperative changes in the lower lumbar spine. Multiple areas of central canal stenosis are suggested, most severe at the L5-S1 level. IMPRESSION: 1. Cholelithiasis and choledocholithiasis with associated bile duct dilatation. 2. Diffuse low signal intensity throughout the liver consistent with iron deposition. Probable diffuse fatty infiltration as well. 3. Heterogeneous appearance of the spleen may be related iron deposition or could indicate infectious process. No focal lesions. 4. Bilateral renal cysts. 5. Dilated abdominal aorta  with maximal AP diameter 3.4 cm. Recommend followup by ultrasound in 3 years. This recommendation follows ACR consensus guidelines: White Paper of the ACR Incidental Findings Committee II on Vascular Findings. J Am Coll Radiol 2013; 36:144-315 6. Degenerative changes throughout the lumbar spine with areas of central canal stenosis, most prominent at the L5-S1 level. Electronically Signed   By: Lucienne Capers M.D.   On: 09/04/2017 23:06   Dg Ercp  Result Date: 09/06/2017 CLINICAL DATA:  ERCP with sphincterotomy, balloon occlusion cholangiogram and CBD stones/sludge removal. EXAM: ERCP TECHNIQUE: Multiple spot images obtained with the fluoroscopic device and submitted for interpretation post-procedure. COMPARISON:  MRCP - 09/04/2017; CT abdomen pelvis -09/04/2017 FLUOROSCOPY TIME:  7 minutes, 8 seconds FINDINGS: Ten spot intraoperative fluoroscopic images of the right upper abdominal quadrant during ERCP are provided for review Initial image demonstrates an ERCP probe overlying the right upper abdominal quadrant. There is a suspected cannulation of the pancreatic duct. Subsequent images demonstrate selective cannulation and opacification of the common bile duct with several persistent filling defects seen within the CBD suggestive of choledocholithiasis demonstrated on preceding MRCP. Subsequent images demonstrate insufflation of a balloon within the central / mid aspect  of the CBD with subsequent presumed sweeping and sphincterotomy. The common bile duct appears mildly dilated. There is minimal opacification of the central aspect of the intrahepatic biliary tree which appears very mildly dilated. There is no definitive opacification of the cystic or pancreatic ducts. IMPRESSION: ERCP with findings of choledocholithiasis with subsequent balloon sweeping and presumed sphincterotomy as detailed above. These images were submitted for radiologic interpretation only. Please see the procedural report for the amount  of contrast and the fluoroscopy time utilized. Electronically Signed   By: Sandi Mariscal M.D.   On: 09/06/2017 13:37    Time Spent in minutes  30 minutes   Barton Dubois M.D on 09/07/2017 at 11:38 PM  Between 7am to 7pm - Pager - [phone]  After 7pm go to [email or web address] - password [facility]  [facility] -  Office  [phone]

## 2017-09-07 NOTE — Anesthesia Postprocedure Evaluation (Signed)
Anesthesia Post Note  Patient: Alexander Rice  Procedure(s) Performed: LAPAROSCOPIC CHOLECYSTECTOMY WITH INTRAOPERATIVE CHOLANGIOGRAM, REPAIR OF CHOLECYSTODUODENAL FISTULA, UPPER ENDOSCOPY (N/A Abdomen)     Patient location during evaluation: PACU Anesthesia Type: General Pain management: pain level controlled Vital Signs Assessment: post-procedure vital signs reviewed and stable Respiratory status: spontaneous breathing Anesthetic complications: no    Last Vitals:  Vitals:   09/07/17 1000 09/07/17 1030  BP: (!) 175/68   Pulse: 68   Resp: 13   Temp:  (!) 36.3 C  SpO2: 99%     Last Pain:  Vitals:   09/07/17 0502  TempSrc: Oral  PainSc:                  Margeart Allender

## 2017-09-07 NOTE — Transfer of Care (Signed)
Immediate Anesthesia Transfer of Care Note  Patient: Alexander Rice  Procedure(s) Performed: Procedure(s): LAPAROSCOPIC CHOLECYSTECTOMY WITH INTRAOPERATIVE CHOLANGIOGRAM, REPAIR OF CHOLECYSTODUODENAL FISTULA, UPPER ENDOSCOPY (N/A)  Patient Location: PACU  Anesthesia Type:General  Level of Consciousness:  sedated, patient cooperative and responds to stimulation  Airway & Oxygen Therapy:Patient Spontanous Breathing and Patient connected to face mask oxgen  Post-op Assessment:  Report given to PACU RN and Post -op Vital signs reviewed and stable  Post vital signs:  Reviewed and stable  Last Vitals:  Vitals:   09/06/17 2106 09/07/17 0502  BP: (!) 152/56 (!) 170/62  Pulse: 63 60  Resp: 18 18  Temp: 36.8 C 36.8 C  SpO2: 27% 03%    Complications: No apparent anesthesia complications

## 2017-09-07 NOTE — Progress Notes (Signed)
Patient is ready for surgery.  Alphonsa Overall, MD, [facility] Pager: [phone] Office phone:  [phone]

## 2017-09-07 NOTE — Anesthesia Procedure Notes (Addendum)
Procedure Name: Intubation Date/Time: 09/07/2017 7:43 AM Performed by: Anne Fu Pre-anesthesia Checklist: Patient identified, Emergency Drugs available, Suction available, Patient being monitored and Timeout performed Patient Re-evaluated:Patient Re-evaluated prior to induction Oxygen Delivery Method: Circle system utilized Preoxygenation: Pre-oxygenation with 100% oxygen Induction Type: IV induction Ventilation: Mask ventilation without difficulty Laryngoscope Size: Mac and 4 Grade View: Grade I Tube type: Oral Tube size: 7.5 mm Number of attempts: 1 Airway Equipment and Method: Stylet Placement Confirmation: ETT inserted through vocal cords under direct vision,  positive ETCO2 and breath sounds checked- equal and bilateral Secured at: 21 cm Tube secured with: Tape Dental Injury: Teeth and Oropharynx as per pre-operative assessment

## 2017-09-07 NOTE — Op Note (Signed)
09/04/2017 - 09/07/2017  9:54 AM  PATIENT:  Alexander Rice, 83 y.o., male, MRN: [id number]  PREOP DIAGNOSIS:  Cholelithiasis, s/p ERCP  POSTOP DIAGNOSIS:   Chronic cholecystitis, cholelithiasis, cholecystoduodenal fistula [photos taken and placed in paper chart]  PROCEDURE:   Procedure(s): LAPAROSCOPIC CHOLECYSTECTOMY WITH INTRAOPERATIVE CHOLANGIOGRAM, REPAIR OF CHOLECYSTODUODENAL FISTULA, UPPER ENDOSCOPY  SURGEON:   Alphonsa Overall, M.D.  ASSISTANT:   Clyda Greener  ANESTHESIA:   general  Anesthesiologist: Belinda Block, MD CRNA: Anne Fu, CRNA  General  ASA: 3  EBL:  minimal  ml  BLOOD ADMINISTERED: none  DRAINS: none   LOCAL MEDICATIONS USED:   30 cc 1/4% marcaine  SPECIMEN:   Gall bladder  COUNTS CORRECT:  YES  INDICATIONS FOR PROCEDURE:  Alexander Rice is a 83 y.o. (DOB: 03/31/1932) white male whose primary care physician is Marin Olp, MD and comes for cholecystectomy.   The indications and risks of the gall bladder surgery were explained to the patient.  The risks include, but are not limited to, infection, bleeding, common bile duct injury and open surgery.  SURGERY:  The patient was taken to OR room #1 at [facility].  The abdomen was prepped with chloroprep.  The patient was on Zosyn prior to the beginning of the operation.   A time out was held and the surgical checklist run.   An infraumbilical incision was made into the abdominal cavity.  A 12 mm Hasson trocar was inserted into the abdominal cavity through the infraumbilical incision and secured with a 0 Vicryl suture.  Three additional trocars were inserted: a 10 mm trocar in the sub-xiphoid location, a 5 mm trocar in the right mid subcostal area, and a 5 mm trocar in the right lateral subcostal area.   The abdomen was explored and the liver, stomach, and bowel that could be seen were unremarkable.   The gall bladder was chronically inflamed and stuck to the duodenum.  .   I  grasped the gall bladder and rotated it cephalad.   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  The fistula was about 1.5 cm in diameter and I was able to isolate the fistula on the duodenum.  I took a 45 mm blue Ethicon stapler and fired this on the duodenal side of the fistula.  This created a clean staple line.  Photos were taken.   Disssection was carried down to the gall bladder/cystic duct junction and the cystic duct isolated.  A clip was placed on the gall bladder side of the cystic duct.   An intra-operative cholangiogram was shot.   The intra-operative cholangiogram was shot using a cut off Taut catheter placed through a 14 gauge angiocath in the RUQ.  The Taut catheter was inserted in the cut cystic duct and secured with an endoclip.  A cholangiogram was shot with 10 cc of 1/2 strength Isoview.  Using fluoroscopy, the cholangiogram showed the flow of contrast into the common bile duct, and up the hepatic radicals.  The common bile duct was large.  There appeared to be some distal debris in the CBD. I think with the sphincterotomy, this should pass.   The Taut catheter was removed.  The cystic duct was tripley endoclipped and the cystic artery was identified and clipped.  The gall bladder was bluntly and sharpley dissected from the gall bladder bed.   After the gall bladder was  removed from the liver, the gall bladder bed and Tri[place] of [place] were inspected.  There was no bleeding or bile leak.  The gall bladder was placed in a endocatch bag and delivered through the umbilicus.  The abdomen was irrigated with 2,000 cc saline.   At this point I did an upper endoscopy (with the colonoscope).  I passed the endoscope without difficulty.  The esophagus and stomach were unremarkable.  I was able to cannulate the pylorus and advance the scope into the duodenum.  The fistula appeared to be on the side wall at the  junction of the duodenal bulb and the second part of the duodenum.  Dr. Johney Maine flooded the abdomen with saline and saw no bubbling.  The staple line was intact.   I then scrubbed back in.  The trocars were then removed.  I infiltrated 30cc of 1/4% Marcaine into the incisions.  The umbilical port closed with a 0 Vicryl suture and the skin closed with 4-0 Monocryl.  The skin was painted with DermaBond.  The patient's sponge and needle count were correct.  The patient was transported to the RR in good condition.  Alphonsa Overall, MD, [facility] Surgery Pager: [phone] Office phone:  [phone]

## 2017-09-07 NOTE — Progress Notes (Signed)
SLP Cancellation Note  Patient Details Name: Alexander Rice MRN: [id number] DOB: Dec 25, 1931   Cancelled treatment:       Reason Eval/Treat Not Completed: Patient at procedure or test/unavailable;Medical issues which prohibited therapy. Patient was NPO prior to and after surgery this morning.     Sonia Baller, MA, CCC-SLP 09/07/17 11:10 AM

## 2017-09-07 NOTE — Anesthesia Preprocedure Evaluation (Signed)
Anesthesia Evaluation  Patient identified by MRN, date of birth, ID band Patient awake    Reviewed: Allergy & Precautions, NPO status   Airway Mallampati: II  TM Distance: >3 FB     Dental   Pulmonary former smoker,    breath sounds clear to auscultation       Cardiovascular hypertension, + CAD and + Peripheral Vascular Disease   Rhythm:Regular Rate:Normal     Neuro/Psych    GI/Hepatic Neg liver ROS, hiatal hernia, GERD  ,GI history noted. CG   Endo/Other    Renal/GU Renal disease     Musculoskeletal   Abdominal   Peds  Hematology  (+) anemia ,   Anesthesia Other Findings   Reproductive/Obstetrics                             Anesthesia Physical Anesthesia Plan  ASA: III  Anesthesia Plan: General   Post-op Pain Management:    Induction: Intravenous  PONV Risk Score and Plan: 2 and Ondansetron, Dexamethasone, Treatment may vary due to age or medical condition and Midazolam  Airway Management Planned: Oral ETT  Additional Equipment:   Intra-op Plan:   Post-operative Plan: Possible Post-op intubation/ventilation  Informed Consent: I have reviewed the patients History and Physical, chart, labs and discussed the procedure including the risks, benefits and alternatives for the proposed anesthesia with the patient or authorized representative who has indicated his/her understanding and acceptance.   Dental advisory given  Plan Discussed with: CRNA and Anesthesiologist  Anesthesia Plan Comments:         Anesthesia Quick Evaluation

## 2017-09-08 ENCOUNTER — Encounter (HOSPITAL_COMMUNITY): Payer: Self-pay | Admitting: Surgery

## 2017-09-08 LAB — GLUCOSE, CAPILLARY: GLUCOSE-CAPILLARY: 109 mg/dL — AB (ref 65–99)

## 2017-09-08 LAB — CBC WITH DIFFERENTIAL/PLATELET
BASOS ABS: 0 10*3/uL (ref 0.0–0.1)
Basophils Relative: 0 %
EOS ABS: 0.3 10*3/uL (ref 0.0–0.7)
EOS PCT: 4 %
HCT: 27.5 % — ABNORMAL LOW (ref 39.0–52.0)
Hemoglobin: 9.5 g/dL — ABNORMAL LOW (ref 13.0–17.0)
LYMPHS ABS: 1.1 10*3/uL (ref 0.7–4.0)
Lymphocytes Relative: 17 %
MCH: 33.1 pg (ref 26.0–34.0)
MCHC: 34.5 g/dL (ref 30.0–36.0)
MCV: 95.8 fL (ref 78.0–100.0)
MONO ABS: 0.6 10*3/uL (ref 0.1–1.0)
Monocytes Relative: 10 %
Neutro Abs: 4.5 10*3/uL (ref 1.7–7.7)
Neutrophils Relative %: 69 %
PLATELETS: 287 10*3/uL (ref 150–400)
RBC: 2.87 MIL/uL — ABNORMAL LOW (ref 4.22–5.81)
RDW: 19.1 % — AB (ref 11.5–15.5)
WBC: 6.5 10*3/uL (ref 4.0–10.5)

## 2017-09-08 LAB — COMPREHENSIVE METABOLIC PANEL
ALBUMIN: 2.8 g/dL — AB (ref 3.5–5.0)
ALK PHOS: 122 U/L (ref 38–126)
ALT: 243 U/L — AB (ref 17–63)
AST: 181 U/L — AB (ref 15–41)
Anion gap: 7 (ref 5–15)
BILIRUBIN TOTAL: 2.4 mg/dL — AB (ref 0.3–1.2)
BUN: 12 mg/dL (ref 6–20)
CO2: 23 mmol/L (ref 22–32)
CREATININE: 1 mg/dL (ref 0.61–1.24)
Calcium: 8.4 mg/dL — ABNORMAL LOW (ref 8.9–10.3)
Chloride: 102 mmol/L (ref 101–111)
GFR calc Af Amer: >60 mL/min (ref >60)
GLUCOSE: 109 mg/dL — AB (ref 65–99)
POTASSIUM: 4.5 mmol/L (ref 3.5–5.1)
Sodium: 132 mmol/L — ABNORMAL LOW (ref 135–145)
TOTAL PROTEIN: 4.9 g/dL — AB (ref 6.5–8.1)

## 2017-09-08 MED ORDER — ENOXAPARIN SODIUM 40 MG/0.4ML ~~LOC~~ SOLN
40.0000 mg | SUBCUTANEOUS | Status: DC
  Administered 2017-09-08 – 2017-09-12 (×5): 40 mg via SUBCUTANEOUS
  Filled 2017-09-08 (×5): qty 0.4

## 2017-09-08 MED ORDER — PANTOPRAZOLE SODIUM 40 MG IV SOLR
40.0000 mg | Freq: Every day | INTRAVENOUS | Status: DC
  Administered 2017-09-08: 40 mg via INTRAVENOUS
  Filled 2017-09-08: qty 40

## 2017-09-08 NOTE — Progress Notes (Signed)
PROGRESS NOTE                                                                                                                                                                                                             Patient Demographics:    Alexander Rice, is a 83 y.o. male, DOB - [birth year], IFO:[id number]  Admit date - 09/04/2017   Admitting Physician Ivor Costa, MD  Outpatient Primary MD for the patient is Yong Channel Brayton Mars, MD  LOS - 4   Chief Complaint  Patient presents with  . Weakness       Brief Narrative   83 y.o. male with medical history significant of MDS (follows with Dr. Marin Olp), hypertension, hyperlipidemia, stroke, CAD s/p of stent placement, BPH, anemia, Parkinson's disease, CKD-3, who presents with low hemoglobin, generalized weakness and fever.workup significant for profound anemia with hemoglobin of 5.2, and choledocholithiasis.transfused 2 units PRBC with good response, went for ERCP 09/06/2017 significant for with large stone burden, cleared with biliary sphincterotomy and balloon extraction.   Subjective:    Alexander Rice no CP, no nausea, no vomiting and just mild abd pain.   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/2017 Active Problems:   Hyperlipidemia   Essential hypertension   CAD (coronary artery disease)   GERD   BPH (benign prostatic hyperplasia)   MDS (myelodysplastic syndrome) (HCC)   Pernicious anemia   Hyponatremia   Moderate dementia, without behavioral disturbance   Acute renal failure superimposed on stage 3 chronic kidney disease (HCC)   AAA (abdominal aortic aneurysm) (HCC)   Cholecystitis   Choledocholithiasis   Choledocholithiasis - MRCP significant for cholelithiasis and choledocholithiasis, with bile duct by dictation,he is a significantly elevated bilirubin,indicative of obstructive jaundice, - GI input greatly  appreciated, went for ERCP, significant for Extensive choledocholithiasis was found. almost complete removal was accomplished by biliary sphincterotomy and balloon extraction. - given the reported fever at home will continue with IV Zosyn for now.  - followed by surgery, S/P laparoscopy procedure. -will follow rec's from surgery regarding diet advancement   symptomatic anemia - Patient with known history of MDS, pernicious anemia requiring transfusions in the past, Hemoccult-negative, transfused 3 units overnight good response, hemoglobin is 9.6 - Monitor CBC and transfuse as needed,  - f/u with  Dr. Niel Hummer -continue Cyanocobalamin when taking PO's  Hyperlipidemia -holding statins due to elevated LFT's and NPO status. -will follow LFT's trend and will resume sttains when save  HTN: -continue hydralazine while NPO -plan is to resume oral antihypertensive agents when taking PO's again.  CAD (coronary artery disease): - s/p of stent. No CP currently -given profound anemia will continue holding aspirin  -and also holding ACE given low BP and acute on CKD. -resume when tolerating PO's  AKI in CK D stage III -Cr improved with IVF's -will follow clearance to advance diet. -follow renal function trend intermittently  -minimize nephrotoxic agents  GERD: -continue PPI when tolerating PO's -for now on IV protonix  BPH with active urinary retention  -once able to resume PO will continue with Cardura -continue foley for now  Hyponatremia:  -appear to be chronic -most likely associated with dehydration and GI loses -will continue IVF's, especially while NPO -continue monitoring on telemetry.  Moderate dementia, without behavioral disturbance: -continue nameneda when tolerating PO's again.  AAA (abdominal aortic aneurysm) ([facility]): - incidental finding on imaging, atherosclerotic disease with abdominal aortic aneurysm 35 mm. -will need repeat ultrasound in 2 years and outpatient  follow up.   Code Status : Full  Family Communication  : none at bedside  Disposition Plan  : Pending further workup. Will determine best venue at discharge.  Consults  :  Gen syrgery, GI  Procedures  : 3 units PRBC 09/05/2017, ERCP 09/06/2017; laparoscopic cholecystectomy 10/20  DVT Prophylaxis  :  SCDs   Lab Results  Component Value Date   PLT 287 09/08/2017    Antibiotics  :    Anti-infectives    Start     Dose/Rate Route Frequency Ordered Stop   09/05/17 0200  piperacillin-tazobactam (ZOSYN) IVPB 3.375 g     3.375 g 12.5 mL/hr over 240 Minutes Intravenous Every 8 hours 09/04/17 2158     09/04/17 2000  piperacillin-tazobactam (ZOSYN) IVPB 3.375 g     3.375 g 100 mL/hr over 30 Minutes Intravenous  Once 09/04/17 1956 09/04/17 2114        Objective:   Vitals:   09/07/17 1030 09/07/17 1045 09/07/17 2020 09/08/17 0513  BP:  (!) 160/73 (!) 172/60 (!) 178/62  Pulse:  72 66 62  Resp:  14 16 16   Temp: (!) 97.4 F (36.3 C) (!) 97.5 F (36.4 C) 98.2 F (36.8 C) 97.9 F (36.6 C)  TempSrc:  Oral Oral Oral  SpO2:  97% 96% 97%  Weight:      Height:        Wt Readings from Last 3 Encounters:  09/06/17 76.7 kg (169 lb)  07/08/17 77.1 kg (170 lb)  06/17/17 76.7 kg (169 lb)     Intake/Output Summary (Last 24 hours) at 09/08/17 2123 Last data filed at 09/08/17 3664  Gross per 24 hour  Intake             1600 ml  Output              600 ml  Net             1000 ml     Physical Exam GeN: AAOX2, no CP, no SOB and just mild RUQ abd discomfort on palpation. No nausea, no vomiting, no BM or flatus yet. Cardiovascular: S1 and S2, no rubs, no gallops, no JVD. Resp: CTA bilaterally, no using accessory muscle, normal resp effort.  Abd: soft, ND, no BS appreciated, mild RUQ tenderness.  Data Review:    CBC  Recent Labs Lab 09/04/17 1220  09/04/17 2010 09/05/17 0535 09/05/17 1540 09/06/17 0513 09/07/17 0534 09/08/17 0559  WBC 10.0  --  9.7 6.8 6.4 5.1 3.6*  6.5  HGB 7.1 Repeated and verified X2.*  --  5.2* 7.5* 9.9* 9.8* 9.8* 9.5*  HCT 20.5 Repeated and verified X2.*  --  14.5* 21.3* 27.9* 27.5* 27.2* 27.5*  PLT 308.0  --  312 251 279 287 275 287  MCV 101.4*  --  94.8 94.7 92.1 92.3 92.5 95.8  MCH  --   < > 34.0 33.3 32.7 32.9 33.3 33.1  MCHC 34.9  --  35.9 35.2 35.5 35.6 36.0 34.5  RDW 22.7*  --  23.0* 20.5* 19.2* 19.7* 18.8* 19.1*  LYMPHSABS 0.4*  --  0.5*  --   --   --   --  1.1  MONOABS 0.5  --  0.8  --   --   --   --  0.6  EOSABS 0.0  --  0.1  --   --   --   --  0.3  BASOSABS 0.0  --  0.0  --   --   --   --  0.0  < > = values in this interval not displayed.  Chemistries   Recent Labs Lab 09/04/17 1220 09/05/17 0020 09/05/17 0535 09/06/17 0513 09/07/17 0534 09/08/17 0559  NA 122* 123* 125* 127* 130* 132*  K 4.7 4.5 4.2 4.3 4.7 4.5  CL 92* 98* 98* 100* 101 102  CO2 21 17* 18* 19* 22 23  GLUCOSE 102* 112* 105* 94 133* 109*  BUN 37* 33* 28* 20 18 12   CREATININE 1.61* 1.22 1.01 0.95 0.98 1.00  CALCIUM 8.4 7.7* 7.7* 8.1* 8.4* 8.4*  AST 249*  --  156* 97* 58* 181*  ALT 322*  --  64* 85* 51 243*  ALKPHOS 213*  --  171* 155* 139* 122  BILITOT 10.0*  --  8.2* 4.3* 2.7* 2.4*   Coagulation profile  Recent Labs Lab 09/04/17 1220  INR 1.3*   Inpatient Medications  Scheduled Meds: . acidophilus  1 capsule Oral Daily  . amLODipine  5 mg Oral Daily  . carbidopa-levodopa  0.5 tablet Oral TID  . Chlorhexidine Gluconate Cloth  6 each Topical Daily  . dorzolamide  1 drop Both Eyes BID  . doxazosin  4 mg Oral QHS  . enoxaparin (LOVENOX) injection  40 mg Subcutaneous Q24H  . indomethacin  100 mg Rectal Once  . latanoprost  1 drop Both Eyes QHS  . memantine  10 mg Oral BID  . multivitamin with minerals  1 tablet Oral Daily  . mupirocin ointment  1 application Nasal BID  . pantoprazole  40 mg Oral Daily  . vitamin B-6  250 mg Oral Daily  . cyanocobalamin  100 mcg Oral Daily   Continuous Infusions: . dextrose 5 % and 0.45 %  NaCl with KCl 20 mEq/L 100 mL/hr at 09/08/17 0708  . piperacillin-tazobactam (ZOSYN)  IV 3.375 g (09/08/17 1800)   PRN Meds:.hydrALAZINE, morphine injection, ondansetron **OR** ondansetron (ZOFRAN) IV, zolpidem  Micro Results Recent Results (from the past 240 hour(s))  Blood Culture (routine x 2)     Status: None (Preliminary result)   Collection Time: 09/04/17  8:10 PM  Result Value Ref Range Status   Specimen Description BLOOD LEFT ANTECUBITAL  Final   Special Requests   Final    BOTTLES DRAWN AEROBIC AND ANAEROBIC  Blood Culture adequate volume   Culture   Final    NO GROWTH 3 DAYS Performed at [facility], [place] [address], [place], [place] [address]    Report Status PENDING  Incomplete  Blood Culture (routine x 2)     Status: None (Preliminary result)   Collection Time: 09/04/17  8:10 PM  Result Value Ref Range Status   Specimen Description BLOOD RIGHT ANTECUBITAL  Final   Special Requests   Final    BOTTLES DRAWN AEROBIC AND ANAEROBIC Blood Culture adequate volume   Culture   Final    NO GROWTH 3 DAYS Performed at [facility], [place] [address] [place], [place] [address]    Report Status PENDING  Incomplete  Surgical pcr screen     Status: Abnormal   Collection Time: 09/06/17  9:18 PM  Result Value Ref Range Status   MRSA, PCR NEGATIVE NEGATIVE Final   Staphylococcus aureus POSITIVE (A) NEGATIVE Final    Comment: (NOTE) The Xpert SA Assay (FDA approved for NASAL specimens in patients 35 years of age and older), is one component of a comprehensive surveillance program. It is not intended to diagnose infection nor to guide or monitor treatment.     Radiology Reports Dg Chest 2 View  Result Date: 09/04/2017 CLINICAL DATA:  Cough and fevers with increased confusion EXAM: CHEST  2 VIEW COMPARISON:  05/29/2016 FINDINGS: Cardiac shadow remains enlarged. The thoracic aorta is tortuous. Calcifications are noted. Elevation of left hemidiaphragm is again seen. Mild  interstitial changes are noted. Mild bibasilar atelectatic changes are noted. No focal infiltrate is noted. IMPRESSION: Mild bibasilar atelectatic changes. Aortic Atherosclerosis (ICD10-170.0) Electronically Signed   By: Inez Catalina M.D.   On: 09/04/2017 15:22   Dg Cholangiogram Operative  Result Date: 09/07/2017 CLINICAL DATA:  Cholecystectomy, intraoperative imaging EXAM: INTRAOPERATIVE CHOLANGIOGRAM TECHNIQUE: Cholangiographic images from the C-arm fluoroscopic device were submitted for interpretation post-operatively. Please see the procedural report for the amount of contrast and the fluoroscopy time utilized. COMPARISON:  09/06/2017 FINDINGS: Intraoperative cholangiogram performed during the laparoscopic procedure. Injection of the cystic duct demonstrates diffuse biliary dilatation of the intrahepatic ducts, biliary confluence, common hepatic duct, and common bile duct. Irregular filling defect in the distal CBD may represent residual choledocholithiasis versus sludge or blood clot. Contrast does not freely drain into the duodenum. IMPRESSION: Distal CBD irregular ill-defined filling defect as above. Associated biliary dilatation and obstruction. Contrast does not freely flow into the duodenum. Electronically Signed   By: Jerilynn Mages.  Shick M.D.   On: 09/07/2017 09:50   Ct Abdomen Pelvis W Contrast  Result Date: 09/04/2017 CLINICAL DATA:  Abnormal LFT.  Jaundice EXAM: CT ABDOMEN AND PELVIS WITH CONTRAST TECHNIQUE: Multidetector CT imaging of the abdomen and pelvis was performed using the standard protocol following bolus administration of intravenous contrast. CONTRAST:  <See Chart> ISOVUE-300 IOPAMIDOL (ISOVUE-300) INJECTION 61% COMPARISON:  CT abdomen pelvis 04/01/2012 FINDINGS: Lower chest: Mild left lower lobe atelectasis. Extensive coronary calcification. Small left lower lobe lung nodule on the prior study difficult to see on today's study due to atelectasis Hepatobiliary: Biliary dilatation. Mild  intrahepatic and extrahepatic biliary dilatation. Soft tissue density within the common bile duct distally may represent common bile duct stones. Gallbladder is contracted. There is gas in the fundus of the gallbladder. Gallbladder wall not significantly thickened. No focal liver mass. Pancreas: Negative Spleen: Multiple calcified granulomata.  Normal splenic size Adrenals/Urinary Tract: Multiple left renal cyst. No renal mass or obstruction. Urinary bladder normal  Stomach/Bowel: Negative for bowel obstruction.  Negative for mass Vascular/Lymphatic: Extensive atherosclerotic calcification. Abdominal aortic aneurysm measuring up to 35 mm, measuring 32 mm previously. Reproductive: Mild prostate enlargement Other: 10 x 20 mm calcification medially inferior to the right lobe of the liver. This was not present previously and appears separate from bowel. Possible fat necrosis. No surrounding inflammation to suggest acute process. Musculoskeletal: Scoliosis and extensive lumbar degenerative change. X stop device at L4-5. No acute skeletal abnormality. IMPRESSION: Left lower lobe atelectasis.  Extensive coronary calcification Biliary dilatation. Soft tissue density within the common bile duct may represent gallstones or possibly tumor. In addition, there is gas within the gallbladder fundus. This may be related to recently passed gallstones or emphysematous cholecystitis. However, the gallbladder does not appear to be acutely inflamed or thickened at this time. 10 x 20 mm calcification inferior to the right lobe liver appears nonacute without surrounding edema however this was not present on the prior study Atherosclerotic disease with abdominal aortic aneurysm 35 mm Recommend followup by ultrasound in 2 years. This recommendation follows ACR consensus guidelines: White Paper of the ACR Incidental Findings Committee II on Vascular Findings. J Am Coll Radiol 2013; 10:789-794. Electronically Signed   By: Franchot Gallo M.D.    On: 09/04/2017 19:16   Mr Abdomen Mrcp Wo Contrast  Result Date: 09/04/2017 CLINICAL DATA:  Elevated liver function studies. Gas in the gallbladder on CT. Common duct stones seen on CT. Patient has dementia and unable to hold breath. EXAM: MRI ABDOMEN WITHOUT CONTRAST  (INCLUDING MRCP) TECHNIQUE: Multiplanar multisequence MR imaging of the abdomen was performed. Heavily T2-weighted images of the biliary and pancreatic ducts were obtained, and three-dimensional MRCP images were rendered by post processing. COMPARISON:  CT abdomen and pelvis 09/04/2017 FINDINGS: Examination is technically limited due to motion artifact. Lower chest: There appear to be minimal bilateral pleural effusions. Hepatobiliary: No focal liver lesions are demonstrated. There is diffuse low signal intensity throughout the liver particularly on T2 weighted imaging. This suggests iron deposition. Apparent signal loss in the liver on out of phase imaging suggesting diffuse fatty infiltration as well. Diffuse intra and extrahepatic bile duct dilatation. Multiple filling defects seen in the mid and distal common bile duct consistent with choledocholithiasis. Gallbladder is partially contracted. Limited visualization of the gallbladder due to technique but no apparent wall thickening. There is a filling defect within the gallbladder likely representing a gallstone. The intraluminal gas seen at CT is not appreciated on MRI. Pancreas: No focal pancreatic lesion or ductal dilatation identified. Spleen: Spleen size is normal. The spleen has a diffusely heterogeneous appearance with tiny nodular pattern demonstrated particularly on T2 weighted imaging. This may reflect changes due to iron overload or may indicate an infectious process. No specific focal lesions are identified. Adrenals/Urinary Tract: No adrenal gland nodules. Bilateral renal cysts. No hydronephrosis or hydroureter. Stomach/Bowel: Visualized portions within the abdomen are  unremarkable. Vascular/Lymphatic: Infrarenal aortic ectasia with maximal AP diameter of about 3.4 cm. No significant lymphadenopathy. Other: No free intra-abdominal fluid is demonstrated. Mild edema in the dependent soft tissues. Musculoskeletal: Degenerative changes and scoliosis of the lumbar spine. Postoperative changes in the lower lumbar spine. Multiple areas of central canal stenosis are suggested, most severe at the L5-S1 level. IMPRESSION: 1. Cholelithiasis and choledocholithiasis with associated bile duct dilatation. 2. Diffuse low signal intensity throughout the liver consistent with iron deposition. Probable diffuse fatty infiltration as well. 3. Heterogeneous appearance of the spleen may be related iron deposition or could indicate infectious  process. No focal lesions. 4. Bilateral renal cysts. 5. Dilated abdominal aorta with maximal AP diameter 3.4 cm. Recommend followup by ultrasound in 3 years. This recommendation follows ACR consensus guidelines: White Paper of the ACR Incidental Findings Committee II on Vascular Findings. J Am Coll Radiol 2013; 29:924-268 6. Degenerative changes throughout the lumbar spine with areas of central canal stenosis, most prominent at the L5-S1 level. Electronically Signed   By: Lucienne Capers M.D.   On: 09/04/2017 23:06   Mr 3d Recon At Scanner  Result Date: 09/04/2017 CLINICAL DATA:  Elevated liver function studies. Gas in the gallbladder on CT. Common duct stones seen on CT. Patient has dementia and unable to hold breath. EXAM: MRI ABDOMEN WITHOUT CONTRAST  (INCLUDING MRCP) TECHNIQUE: Multiplanar multisequence MR imaging of the abdomen was performed. Heavily T2-weighted images of the biliary and pancreatic ducts were obtained, and three-dimensional MRCP images were rendered by post processing. COMPARISON:  CT abdomen and pelvis 09/04/2017 FINDINGS: Examination is technically limited due to motion artifact. Lower chest: There appear to be minimal bilateral pleural  effusions. Hepatobiliary: No focal liver lesions are demonstrated. There is diffuse low signal intensity throughout the liver particularly on T2 weighted imaging. This suggests iron deposition. Apparent signal loss in the liver on out of phase imaging suggesting diffuse fatty infiltration as well. Diffuse intra and extrahepatic bile duct dilatation. Multiple filling defects seen in the mid and distal common bile duct consistent with choledocholithiasis. Gallbladder is partially contracted. Limited visualization of the gallbladder due to technique but no apparent wall thickening. There is a filling defect within the gallbladder likely representing a gallstone. The intraluminal gas seen at CT is not appreciated on MRI. Pancreas: No focal pancreatic lesion or ductal dilatation identified. Spleen: Spleen size is normal. The spleen has a diffusely heterogeneous appearance with tiny nodular pattern demonstrated particularly on T2 weighted imaging. This may reflect changes due to iron overload or may indicate an infectious process. No specific focal lesions are identified. Adrenals/Urinary Tract: No adrenal gland nodules. Bilateral renal cysts. No hydronephrosis or hydroureter. Stomach/Bowel: Visualized portions within the abdomen are unremarkable. Vascular/Lymphatic: Infrarenal aortic ectasia with maximal AP diameter of about 3.4 cm. No significant lymphadenopathy. Other: No free intra-abdominal fluid is demonstrated. Mild edema in the dependent soft tissues. Musculoskeletal: Degenerative changes and scoliosis of the lumbar spine. Postoperative changes in the lower lumbar spine. Multiple areas of central canal stenosis are suggested, most severe at the L5-S1 level. IMPRESSION: 1. Cholelithiasis and choledocholithiasis with associated bile duct dilatation. 2. Diffuse low signal intensity throughout the liver consistent with iron deposition. Probable diffuse fatty infiltration as well. 3. Heterogeneous appearance of the  spleen may be related iron deposition or could indicate infectious process. No focal lesions. 4. Bilateral renal cysts. 5. Dilated abdominal aorta with maximal AP diameter 3.4 cm. Recommend followup by ultrasound in 3 years. This recommendation follows ACR consensus guidelines: White Paper of the ACR Incidental Findings Committee II on Vascular Findings. J Am Coll Radiol 2013; 34:196-222 6. Degenerative changes throughout the lumbar spine with areas of central canal stenosis, most prominent at the L5-S1 level. Electronically Signed   By: Lucienne Capers M.D.   On: 09/04/2017 23:06   Dg Ercp  Result Date: 09/06/2017 CLINICAL DATA:  ERCP with sphincterotomy, balloon occlusion cholangiogram and CBD stones/sludge removal. EXAM: ERCP TECHNIQUE: Multiple spot images obtained with the fluoroscopic device and submitted for interpretation post-procedure. COMPARISON:  MRCP - 09/04/2017; CT abdomen pelvis -09/04/2017 FLUOROSCOPY TIME:  7 minutes, 8 seconds FINDINGS:  Ten spot intraoperative fluoroscopic images of the right upper abdominal quadrant during ERCP are provided for review Initial image demonstrates an ERCP probe overlying the right upper abdominal quadrant. There is a suspected cannulation of the pancreatic duct. Subsequent images demonstrate selective cannulation and opacification of the common bile duct with several persistent filling defects seen within the CBD suggestive of choledocholithiasis demonstrated on preceding MRCP. Subsequent images demonstrate insufflation of a balloon within the central / mid aspect of the CBD with subsequent presumed sweeping and sphincterotomy. The common bile duct appears mildly dilated. There is minimal opacification of the central aspect of the intrahepatic biliary tree which appears very mildly dilated. There is no definitive opacification of the cystic or pancreatic ducts. IMPRESSION: ERCP with findings of choledocholithiasis with subsequent balloon sweeping and presumed  sphincterotomy as detailed above. These images were submitted for radiologic interpretation only. Please see the procedural report for the amount of contrast and the fluoroscopy time utilized. Electronically Signed   By: Sandi Mariscal M.D.   On: 09/06/2017 13:37    Time Spent in minutes  25 minutes   Barton Dubois M.D on 09/08/2017 at 9:22 PM  Between 7am to 7pm - Pager [phone]  After 7pm go to [email or web address] - password [facility]  [facility] -  Office  [phone]

## 2017-09-08 NOTE — Progress Notes (Signed)
[facility]:  [phone] General Surgery Progress Note   LOS: 4 days  POD -  1 Day Post-Op  Chief Complaint: jaundiced  Assessment and Plan: 1.  LAPAROSCOPIC CHOLECYSTECTOMY WITH INTRAOPERATIVE CHOLANGIOGRAM, REPAIR OF CHOLECYSTODUODENAL FISTULA, UPPER ENDOSCOPY - 09/07/2017 - [facility] [employer]  Looks good - will keep NPO until tomorrow AM.  If he looks this good, he can start clear liquids in AM   2.  ERCP - 09/06/2017 - Ardis Hughs 3.  Dementia 4.  HTN 5.  DVT prophylaxis - To start Lovenox   Principal Problem:   Choleduodenal fistula s/p cholecystectomy/duodenal resection 09/07/2017 Active Problems:   Hyperlipidemia   Essential hypertension   CAD (coronary artery disease)   GERD   BPH (benign prostatic hyperplasia)   MDS (myelodysplastic syndrome) (HCC)   Pernicious anemia   Hyponatremia   Moderate dementia, without behavioral disturbance   Acute renal failure superimposed on stage 3 chronic kidney disease (HCC)   AAA (abdominal aortic aneurysm) (HCC)   Cholecystitis   Choledocholithiasis   Subjective:  Pleasantly confused.   Worried about getting Jello.  Objective:   Vitals:   09/07/17 2020 09/08/17 0513  BP: (!) 172/60 (!) 178/62  Pulse: 66 62  Resp: 16 16  Temp: 98.2 F (36.8 C) 97.9 F (36.6 C)  SpO2: 96% 97%     Intake/Output from previous day:  10/20 0701 - 10/21 0700 In: 4250 [I.V.:3900; IV Piggyback:350] Out: 820 [Urine:770; Blood:50]  Intake/Output this shift:  No intake/output data recorded.   Physical Exam:   General: Older WM who is alert and oriented.    HEENT: Normal. Pupils equal. .   Lungs: Clear   Abdomen: Soft   Wound: Clean   Lab Results:    Recent Labs  09/07/17 0534 09/08/17 0559  WBC 3.6* 6.5  HGB 9.8* 9.5*  HCT 27.2* 27.5*  PLT 275 287    BMET   Recent Labs  09/07/17 0534 09/08/17 0559  NA 130* 132*  K 4.7 4.5  CL 101 102  CO2 22 23  GLUCOSE 133* 109*  BUN 18 12  CREATININE 0.98 1.00   CALCIUM 8.4* 8.4*    PT/INR  No results for input(s): LABPROT, INR in the last 72 hours.  ABG  No results for input(s): PHART, HCO3 in the last 72 hours.  Invalid input(s): PCO2, PO2   Studies/Results:  Dg Cholangiogram Operative  Result Date: 09/07/2017 CLINICAL DATA:  Cholecystectomy, intraoperative imaging EXAM: INTRAOPERATIVE CHOLANGIOGRAM TECHNIQUE: Cholangiographic images from the C-arm fluoroscopic device were submitted for interpretation post-operatively. Please see the procedural report for the amount of contrast and the fluoroscopy time utilized. COMPARISON:  09/06/2017 FINDINGS: Intraoperative cholangiogram performed during the laparoscopic procedure. Injection of the cystic duct demonstrates diffuse biliary dilatation of the intrahepatic ducts, biliary confluence, common hepatic duct, and common bile duct. Irregular filling defect in the distal CBD may represent residual choledocholithiasis versus sludge or blood clot. Contrast does not freely drain into the duodenum. IMPRESSION: Distal CBD irregular ill-defined filling defect as above. Associated biliary dilatation and obstruction. Contrast does not freely flow into the duodenum. Electronically Signed   By: Jerilynn Mages.  Shick M.D.   On: 09/07/2017 09:50   Dg Ercp  Result Date: 09/06/2017 CLINICAL DATA:  ERCP with sphincterotomy, balloon occlusion cholangiogram and CBD stones/sludge removal. EXAM: ERCP TECHNIQUE: Multiple spot images obtained with the fluoroscopic device and submitted for interpretation post-procedure. COMPARISON:  MRCP - 09/04/2017; CT abdomen pelvis -09/04/2017 FLUOROSCOPY TIME:  7 minutes, 8 seconds  FINDINGS: Ten spot intraoperative fluoroscopic images of the right upper abdominal quadrant during ERCP are provided for review Initial image demonstrates an ERCP probe overlying the right upper abdominal quadrant. There is a suspected cannulation of the pancreatic duct. Subsequent images demonstrate selective cannulation and  opacification of the common bile duct with several persistent filling defects seen within the CBD suggestive of choledocholithiasis demonstrated on preceding MRCP. Subsequent images demonstrate insufflation of a balloon within the central / mid aspect of the CBD with subsequent presumed sweeping and sphincterotomy. The common bile duct appears mildly dilated. There is minimal opacification of the central aspect of the intrahepatic biliary tree which appears very mildly dilated. There is no definitive opacification of the cystic or pancreatic ducts. IMPRESSION: ERCP with findings of choledocholithiasis with subsequent balloon sweeping and presumed sphincterotomy as detailed above. These images were submitted for radiologic interpretation only. Please see the procedural report for the amount of contrast and the fluoroscopy time utilized. Electronically Signed   By: Sandi Mariscal M.D.   On: 09/06/2017 13:37     Anti-infectives:   Anti-infectives    Start     Dose/Rate Route Frequency Ordered Stop   09/05/17 0200  piperacillin-tazobactam (ZOSYN) IVPB 3.375 g     3.375 g 12.5 mL/hr over 240 Minutes Intravenous Every 8 hours 09/04/17 2158     09/04/17 2000  piperacillin-tazobactam (ZOSYN) IVPB 3.375 g     3.375 g 100 mL/hr over 30 Minutes Intravenous  Once 09/04/17 1956 09/04/17 2114      Alphonsa Overall, MD, FACS Pager: [facility]: [phone] 09/08/2017

## 2017-09-09 ENCOUNTER — Encounter (HOSPITAL_COMMUNITY): Payer: Self-pay | Admitting: Gastroenterology

## 2017-09-09 LAB — CBC WITH DIFFERENTIAL/PLATELET
Basophils Absolute: 0 10*3/uL (ref 0.0–0.1)
Basophils Relative: 0 %
Eosinophils Absolute: 0.3 10*3/uL (ref 0.0–0.7)
Eosinophils Relative: 5 %
HCT: 27.9 % — ABNORMAL LOW (ref 39.0–52.0)
HEMOGLOBIN: 9.7 g/dL — AB (ref 13.0–17.0)
LYMPHS ABS: 1.3 10*3/uL (ref 0.7–4.0)
LYMPHS PCT: 18 %
MCH: 33 pg (ref 26.0–34.0)
MCHC: 34.8 g/dL (ref 30.0–36.0)
MCV: 94.9 fL (ref 78.0–100.0)
Monocytes Absolute: 0.9 10*3/uL (ref 0.1–1.0)
Monocytes Relative: 14 %
NEUTROS PCT: 63 %
Neutro Abs: 4.3 10*3/uL (ref 1.7–7.7)
Platelets: 292 10*3/uL (ref 150–400)
RBC: 2.94 MIL/uL — AB (ref 4.22–5.81)
RDW: 18.6 % — ABNORMAL HIGH (ref 11.5–15.5)
WBC: 6.8 10*3/uL (ref 4.0–10.5)

## 2017-09-09 LAB — COMPREHENSIVE METABOLIC PANEL
ALK PHOS: 116 U/L (ref 38–126)
ALT: 198 U/L — AB (ref 17–63)
AST: 86 U/L — AB (ref 15–41)
Albumin: 2.5 g/dL — ABNORMAL LOW (ref 3.5–5.0)
Anion gap: 3 — ABNORMAL LOW (ref 5–15)
BUN: 13 mg/dL (ref 6–20)
CALCIUM: 8 mg/dL — AB (ref 8.9–10.3)
CO2: 24 mmol/L (ref 22–32)
CREATININE: 0.93 mg/dL (ref 0.61–1.24)
Chloride: 104 mmol/L (ref 101–111)
Glucose, Bld: 120 mg/dL — ABNORMAL HIGH (ref 65–99)
Potassium: 4.3 mmol/L (ref 3.5–5.1)
SODIUM: 131 mmol/L — AB (ref 135–145)
Total Bilirubin: 2.2 mg/dL — ABNORMAL HIGH (ref 0.3–1.2)
Total Protein: 4.9 g/dL — ABNORMAL LOW (ref 6.5–8.1)

## 2017-09-09 MED ORDER — PANTOPRAZOLE SODIUM 40 MG PO TBEC
40.0000 mg | DELAYED_RELEASE_TABLET | Freq: Every day | ORAL | Status: DC
  Administered 2017-09-09 – 2017-09-12 (×4): 40 mg via ORAL
  Filled 2017-09-09 (×4): qty 1

## 2017-09-09 NOTE — Progress Notes (Signed)
2 Days Post-Op   Subjective/Chief Complaint: Pt doing well No abd pain    Objective: Vital signs in last 24 hours: Temp:  [98 F (36.7 C)-98.2 F (36.8 C)] 98.2 F (36.8 C) (10/22 0556) Pulse Rate:  [63-74] 63 (10/22 0556) Resp:  [19] 19 (10/22 0556) BP: (154-177)/(65-69) 177/65 (10/22 0556) SpO2:  [94 %-95 %] 95 % (10/22 0556) Last BM Date: 09/04/17  Intake/Output from previous day: 10/21 0701 - 10/22 0700 In: 1208.3 [I.V.:1158.3; IV Piggyback:50] Out: 1640 [Urine:1640] Intake/Output this shift: No intake/output data recorded.  General appearance: alert and cooperative GI: soft, non-tender; bowel sounds normal; no masses,  no organomegaly and incision c/d/i  Lab Results:   Recent Labs  09/08/17 0559 09/09/17 0542  WBC 6.5 6.8  HGB 9.5* 9.7*  HCT 27.5* 27.9*  PLT 287 292   BMET  Recent Labs  09/08/17 0559 09/09/17 0542  NA 132* 131*  K 4.5 4.3  CL 102 104  CO2 23 24  GLUCOSE 109* 120*  BUN 12 13  CREATININE 1.00 0.93  CALCIUM 8.4* 8.0*   PT/INR No results for input(s): LABPROT, INR in the last 72 hours. ABG No results for input(s): PHART, HCO3 in the last 72 hours.  Invalid input(s): PCO2, PO2  Studies/Results: Dg Cholangiogram Operative  Result Date: 09/07/2017 CLINICAL DATA:  Cholecystectomy, intraoperative imaging EXAM: INTRAOPERATIVE CHOLANGIOGRAM TECHNIQUE: Cholangiographic images from the C-arm fluoroscopic device were submitted for interpretation post-operatively. Please see the procedural report for the amount of contrast and the fluoroscopy time utilized. COMPARISON:  09/06/2017 FINDINGS: Intraoperative cholangiogram performed during the laparoscopic procedure. Injection of the cystic duct demonstrates diffuse biliary dilatation of the intrahepatic ducts, biliary confluence, common hepatic duct, and common bile duct. Irregular filling defect in the distal CBD may represent residual choledocholithiasis versus sludge or blood clot. Contrast  does not freely drain into the duodenum. IMPRESSION: Distal CBD irregular ill-defined filling defect as above. Associated biliary dilatation and obstruction. Contrast does not freely flow into the duodenum. Electronically Signed   By: Jerilynn Mages.  Shick M.D.   On: 09/07/2017 09:50    Anti-infectives: Anti-infectives    Start     Dose/Rate Route Frequency Ordered Stop   09/05/17 0200  piperacillin-tazobactam (ZOSYN) IVPB 3.375 g     3.375 g 12.5 mL/hr over 240 Minutes Intravenous Every 8 hours 09/04/17 2158     09/04/17 2000  piperacillin-tazobactam (ZOSYN) IVPB 3.375 g     3.375 g 100 mL/hr over 30 Minutes Intravenous  Once 09/04/17 1956 09/04/17 2114      Assessment/Plan: s/p Procedure(s): LAPAROSCOPIC CHOLECYSTECTOMY WITH INTRAOPERATIVE CHOLANGIOGRAM, REPAIR OF CHOLECYSTODUODENAL FISTULA, UPPER ENDOSCOPY (N/A) Will adv diet to clears and adv as tol Mobilize with assistance.   LOS: 5 days    Rosario Jacks., [facility] 09/09/2017

## 2017-09-09 NOTE — Evaluation (Signed)
Physical Therapy Evaluation Patient Details Name: Alexander Rice MRN: [id number] DOB: 16-May-1932 Today's Date: 09/09/2017   History of Present Illness  83 y.o. male with medical history significant of MDS (follows with Dr. Marin Olp), hypertension, hyperlipidemia, stroke, CAD s/p of stent placement, BPH, anemia, Parkinson's disease, CKD-3, and admitted for Cholelithiasis/choledocholithiasis; currently Choleduodenal fistula s/p cholecystectomy/duodenal resection 09/07/2017  Clinical Impression  Pt admitted with above diagnosis. Pt currently with functional limitations due to the deficits listed below (see PT Problem List).  Pt will benefit from skilled PT to increase their independence and safety with mobility to allow discharge to the venue listed below.  Pt requiring increased assist for bed mobility however was able to ambulate short distance with recliner following for safety.  Pt poor historian however states he lives at home with his wife.  Recommend SNF upon d/c at this time.     Follow Up Recommendations SNF    Equipment Recommendations  Rolling walker with 5" wheels    Recommendations for Other Services       Precautions / Restrictions Precautions Precautions: Fall Restrictions Weight Bearing Restrictions: No      Mobility  Bed Mobility Overal bed mobility: Needs Assistance Bed Mobility: Supine to Sit     Supine to sit: Max assist;HOB elevated     General bed mobility comments: increased assist for upper and lower body  Transfers Overall transfer level: Needs assistance Equipment used: Rolling walker (2 wheeled) Transfers: Sit to/from Stand Sit to Stand: Mod assist;From elevated surface         General transfer comment: verbal cues for technique, assist to rise and steady as well as control descent, performed once then took seated rest break in preparation for ambulating  Ambulation/Gait Ambulation/Gait assistance: Min assist;+2  safety/equipment Ambulation Distance (Feet): 25 Feet Assistive device: Rolling walker (2 wheeled) Gait Pattern/deviations: Step-through pattern;Decreased stride length;Trunk flexed     General Gait Details: max verbal cues for RW positioning and posture, fatigued quickly, recliner following, pt reports increased SOB and had increased work of breathing after ambulation however SPO2 98% room air and HR 89 bpm  Stairs            Wheelchair Mobility    Modified Rankin (Stroke Patients Only)       Balance Overall balance assessment: Needs assistance         Standing balance support: Bilateral upper extremity supported Standing balance-Leahy Scale: Poor Standing balance comment: requires UE support                             Pertinent Vitals/Pain Pain Assessment: No/denies pain    Home Living Family/patient expects to be discharged to:: Private residence Living Arrangements: Spouse/significant other             Home Equipment: Environmental consultant - 2 wheels Additional Comments: pt poor historian    Prior Function Level of Independence: Needs assistance   Gait / Transfers Assistance Needed: reports he uses a walker - unable to describe           Hand Dominance        Extremity/Trunk Assessment        Lower Extremity Assessment Lower Extremity Assessment: Generalized weakness       Communication   Communication: HOH  Cognition Arousal/Alertness: Awake/alert Behavior During Therapy: WFL for tasks assessed/performed Overall Cognitive Status: History of cognitive impairments - at baseline  General Comments: pleasantly confused, follows commands with increased time, no family present      General Comments      Exercises     Assessment/Plan    PT Assessment Patient needs continued PT services  PT Problem List Decreased strength;Decreased mobility;Decreased balance;Decreased knowledge of use of  DME;Decreased activity tolerance       PT Treatment Interventions DME instruction;Gait training;Therapeutic activities;Therapeutic exercise;Functional mobility training;Patient/family education;Balance training    PT Goals (Current goals can be found in the Care Plan section)  Acute Rehab PT Goals PT Goal Formulation: With patient Time For Goal Achievement: 09/23/17 Potential to Achieve Goals: Good    Frequency Min 3X/week   Barriers to discharge        Co-evaluation               AM-PAC PT "6 Clicks" Daily Activity  Outcome Measure Difficulty turning over in bed (including adjusting bedclothes, sheets and blankets)?: Unable Difficulty moving from lying on back to sitting on the side of the bed? : Unable Difficulty sitting down on and standing up from a chair with arms (e.g., wheelchair, bedside commode, etc,.)?: Unable Help needed moving to and from a bed to chair (including a wheelchair)?: A Lot Help needed walking in hospital room?: A Lot Help needed climbing 3-5 steps with a railing? : Total 6 Click Score: 8    End of Session Equipment Utilized During Treatment: Gait belt Activity Tolerance: Patient limited by fatigue Patient left: in chair;with call bell/phone within reach;with chair alarm set Nurse Communication: Mobility status PT Visit Diagnosis: Difficulty in walking, not elsewhere classified (R26.2)    Time: 1428-1450 PT Time Calculation (min) (ACUTE ONLY): 22 min   Charges:   PT Evaluation $PT Eval Moderate Complexity: 1 Mod     PT G CodesCarmelia Bake, PT, DPT 09/09/2017 Pager: [phone]  York Ram E 09/09/2017, 3:06 PM

## 2017-09-09 NOTE — Progress Notes (Signed)
PROGRESS NOTE                                                                                                                                                                                                             Patient Demographics:    Alexander Rice, is a 83 y.o. male, DOB - August 13, 1932, XLK:[id number]  Admit date - 09/04/2017   Admitting Physician Ivor Costa, MD  Outpatient Primary MD for the patient is Yong Channel Brayton Mars, MD  LOS - 5   Chief Complaint  Patient presents with  . Weakness       Brief Narrative   83 y.o. male with medical history significant of MDS (follows with Dr. Marin Olp), hypertension, hyperlipidemia, stroke, CAD s/p of stent placement, BPH, anemia, Parkinson's disease, CKD-3, who presents with low hemoglobin, generalized weakness and fever.workup significant for profound anemia with hemoglobin of 5.2, and choledocholithiasis.transfused 2 units PRBC with good response, went for ERCP 09/06/2017 significant for with large stone burden, cleared with biliary sphincterotomy and balloon extraction.   Subjective:    Alexander Rice no chest pain, no nausea, no vomiting, very minimal abdominal discomfort with deep palpation.  Patient reports to be hungry and is looking to have his diet advanced.    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/2017 Active Problems:   Hyperlipidemia   Essential hypertension   CAD (coronary artery disease)   GERD   BPH (benign prostatic hyperplasia)   MDS (myelodysplastic syndrome) (HCC)   Pernicious anemia   Hyponatremia   Moderate dementia, without behavioral disturbance   Acute renal failure superimposed on stage 3 chronic kidney disease (HCC)   AAA (abdominal aortic aneurysm) (HCC)   Cholecystitis   Choledocholithiasis   Choledocholithiasis - MRCP significant for cholelithiasis and choledocholithiasis, with bile  duct by dictation,he is a significantly elevated bilirubin,indicative of obstructive jaundice, - GI input greatly appreciated, went for ERCP, significant for Extensive choledocholithiasis was found. almost complete removal was accomplished by biliary sphincterotomy and balloon extraction. - given the reported fever at home will continue with IV Zosyn for now; follow surgery recommendations for length of antibiotics.  -followed by surgery, S/P laparoscopy procedure, will follow rec's. -diet advance to CLD -PT has seen patient and recommended SNF at discharge. -LFT's trending down   symptomatic anemia -  Patient with known history of MDS, pernicious anemia requiring transfusions in the past, Hemoccult-negative, transfused 3 units overnight good response, hemoglobin is 9.6 - Monitor CBC and transfuse as needed,  - f/u with Dr. Niel Hummer -will resume Cyanocobalamin   Hyperlipidemia -holding statins due to elevated LFT's and NPO status. -will follow LFT's trend and will resume stains when safe  HTN: -Continue hydralazine as needed -Slowly resume oral antihypertensive regimen now that he is taking p.o.'s again  CAD (coronary artery disease): -s/p of stent. No CP currently -given profound anemia will continue holding aspirin for now -and also holding ACE given low BP and acute on CKD; will resume at discharge if otherwise stable for it.  AKI in CK D stage III -Cr improved with IVF's -Pursuit oral hydration now that his diet has been advanced -follow renal function trend intermittently  -minimize nephrotoxic agents  GERD: -continue PPI when tolerating PO's -Resume oral PPI  BPH with active urinary retention  -Will resume Cardura and continue Foley for now   Hyponatremia:  -appear to be chronic; but was worse in the presence of dehydration and GI losses. -will continue IVF's -Will discontinue telemetry.  Moderate dementia, without behavioral disturbance: -Now that his diet has  been advanced will resume Namenda  AAA (abdominal aortic aneurysm) ([place]): -incidental finding on imaging, atherosclerotic disease with abdominal aortic aneurysm 35 mm. -will need repeat ultrasound in 2 years and outpatient follow up.   Code Status : Full  Family Communication  : none at bedside  Disposition Plan  : Currently advancing diet and transitioning medications to by mouth.  Patient to be discharged to skilled nursing facility once medically stable.  Consults  :  Gen syrgery, GI  Procedures  : 3 units PRBC 09/05/2017, ERCP 09/06/2017; laparoscopic cholecystectomy 10/20  DVT Prophylaxis  :  SCDs/ Lovenox  Lab Results  Component Value Date   PLT 292 09/09/2017    Antibiotics  :    Anti-infectives    Start     Dose/Rate Route Frequency Ordered Stop   09/05/17 0200  piperacillin-tazobactam (ZOSYN) IVPB 3.375 g     3.375 g 12.5 mL/hr over 240 Minutes Intravenous Every 8 hours 09/04/17 2158     09/04/17 2000  piperacillin-tazobactam (ZOSYN) IVPB 3.375 g     3.375 g 100 mL/hr over 30 Minutes Intravenous  Once 09/04/17 1956 09/04/17 2114        Objective:   Vitals:   09/08/17 0513 09/08/17 2300 09/09/17 0556 09/09/17 1425  BP: (!) 178/62 (!) 154/69 (!) 177/65 (!) (P) 178/58  Pulse: 62 74 63 (P) 67  Resp: 16 19 19  (P) 18  Temp: 97.9 F (36.6 C) 98 F (36.7 C) 98.2 F (36.8 C)   TempSrc: Oral Oral Oral (P) Oral  SpO2: 97% 94% 95% (P) 97%  Weight:      Height:        Wt Readings from Last 3 Encounters:  09/06/17 76.7 kg (169 lb)  07/08/17 77.1 kg (170 lb)  06/17/17 76.7 kg (169 lb)     Intake/Output Summary (Last 24 hours) at 09/09/17 1718 Last data filed at 09/09/17 1425  Gross per 24 hour  Intake             2000 ml  Output             1440 ml  Net              560 ml     Physical Exam GeN: Afebrile,  alert awake and oriented x2, no chest pain, no shortness of breath patient reports no significant pain in his abdomen.  No bowel movements, no  flatus.  There is no complaints of nausea or vomiting.  So far tolerating clear liquid diet.   Cardiovascular: S1 and S2, no rubs, no gallops, no JVD.  Sinus rhythm appreciated on telemetry. Resp: Clear to auscultation bilaterally, normal respiratory effort, no using accessory muscles, good oxygen saturation on room air.    Abd: Soft, non-distention, quiet abdomen, very mild discomfort on palpation around right upper quadrant and where incision from laparoscopy are located.  Extremities: No edema, no cyanosis   Data Review:    CBC  Recent Labs Lab 09/04/17 1220 09/04/17 2010  09/05/17 1540 09/06/17 0513 09/07/17 0534 09/08/17 0559 09/09/17 0542  WBC 10.0 9.7  < > 6.4 5.1 3.6* 6.5 6.8  HGB 7.1 Repeated and verified X2.* 5.2*  < > 9.9* 9.8* 9.8* 9.5* 9.7*  HCT 20.5 Repeated and verified X2.* 14.5*  < > 27.9* 27.5* 27.2* 27.5* 27.9*  PLT 308.0 312  < > 279 287 275 287 292  MCV 101.4* 94.8  < > 92.1 92.3 92.5 95.8 94.9  MCH  --  34.0  < > 32.7 32.9 33.3 33.1 33.0  MCHC 34.9 35.9  < > 35.5 35.6 36.0 34.5 34.8  RDW 22.7* 23.0*  < > 19.2* 19.7* 18.8* 19.1* 18.6*  LYMPHSABS 0.4* 0.5*  --   --   --   --  1.1 1.3  MONOABS 0.5 0.8  --   --   --   --  0.6 0.9  EOSABS 0.0 0.1  --   --   --   --  0.3 0.3  BASOSABS 0.0 0.0  --   --   --   --  0.0 0.0  < > = values in this interval not displayed.  Chemistries   Recent Labs Lab 09/05/17 0535 09/06/17 0513 09/07/17 0534 09/08/17 0559 09/09/17 0542  NA 125* 127* 130* 132* 131*  K 4.2 4.3 4.7 4.5 4.3  CL 98* 100* 101 102 104  CO2 18* 19* 22 23 24   GLUCOSE 105* 94 133* 109* 120*  BUN 28* 20 18 12 13   CREATININE 1.01 0.95 0.98 1.00 0.93  CALCIUM 7.7* 8.1* 8.4* 8.4* 8.0*  AST 156* 97* 58* 181* 86*  ALT 64* 85* 51 243* 198*  ALKPHOS 171* 155* 139* 122 116  BILITOT 8.2* 4.3* 2.7* 2.4* 2.2*   Coagulation profile  Recent Labs Lab 09/04/17 1220  INR 1.3*   Inpatient Medications  Scheduled Meds: . acidophilus  1 capsule Oral  Daily  . amLODipine  5 mg Oral Daily  . carbidopa-levodopa  0.5 tablet Oral TID  . Chlorhexidine Gluconate Cloth  6 each Topical Daily  . dorzolamide  1 drop Both Eyes BID  . doxazosin  4 mg Oral QHS  . enoxaparin (LOVENOX) injection  40 mg Subcutaneous Q24H  . latanoprost  1 drop Both Eyes QHS  . memantine  10 mg Oral BID  . multivitamin with minerals  1 tablet Oral Daily  . mupirocin ointment  1 application Nasal BID  . pantoprazole  40 mg Oral Q1200  . vitamin B-6  250 mg Oral Daily  . cyanocobalamin  100 mcg Oral Daily   Continuous Infusions: . dextrose 5 % and 0.45 % NaCl with KCl 20 mEq/L 100 mL/hr at 09/09/17 0043  . piperacillin-tazobactam (ZOSYN)  IV 3.375 g (09/09/17 1800)  PRN Meds:.hydrALAZINE, morphine injection, ondansetron **OR** ondansetron (ZOFRAN) IV, zolpidem  Micro Results Recent Results (from the past 240 hour(s))  Blood Culture (routine x 2)     Status: None (Preliminary result)   Collection Time: 09/04/17  8:10 PM  Result Value Ref Range Status   Specimen Description BLOOD LEFT ANTECUBITAL  Final   Special Requests   Final    BOTTLES DRAWN AEROBIC AND ANAEROBIC Blood Culture adequate volume   Culture   Final    NO GROWTH 4 DAYS Performed at [facility], [place] [address], [place], [place] [address]    Report Status PENDING  Incomplete  Blood Culture (routine x 2)     Status: None (Preliminary result)   Collection Time: 09/04/17  8:10 PM  Result Value Ref Range Status   Specimen Description BLOOD RIGHT ANTECUBITAL  Final   Special Requests   Final    BOTTLES DRAWN AEROBIC AND ANAEROBIC Blood Culture adequate volume   Culture   Final    NO GROWTH 4 DAYS Performed at [facility], [place] [address], [place], [place] [address]    Report Status PENDING  Incomplete  Surgical pcr screen     Status: Abnormal   Collection Time: 09/06/17  9:18 PM  Result Value Ref Range Status   MRSA, PCR NEGATIVE NEGATIVE Final   Staphylococcus aureus POSITIVE  (A) NEGATIVE Final    Comment: (NOTE) The Xpert SA Assay (FDA approved for NASAL specimens in patients 81 years of age and older), is one component of a comprehensive surveillance program. It is not intended to diagnose infection nor to guide or monitor treatment.     Radiology Reports Dg Chest 2 View  Result Date: 09/04/2017 CLINICAL DATA:  Cough and fevers with increased confusion EXAM: CHEST  2 VIEW COMPARISON:  05/29/2016 FINDINGS: Cardiac shadow remains enlarged. The thoracic aorta is tortuous. Calcifications are noted. Elevation of left hemidiaphragm is again seen. Mild interstitial changes are noted. Mild bibasilar atelectatic changes are noted. No focal infiltrate is noted. IMPRESSION: Mild bibasilar atelectatic changes. Aortic Atherosclerosis (ICD10-170.0) Electronically Signed   By: Inez Catalina M.D.   On: 09/04/2017 15:22   Dg Cholangiogram Operative  Result Date: 09/07/2017 CLINICAL DATA:  Cholecystectomy, intraoperative imaging EXAM: INTRAOPERATIVE CHOLANGIOGRAM TECHNIQUE: Cholangiographic images from the C-arm fluoroscopic device were submitted for interpretation post-operatively. Please see the procedural report for the amount of contrast and the fluoroscopy time utilized. COMPARISON:  09/06/2017 FINDINGS: Intraoperative cholangiogram performed during the laparoscopic procedure. Injection of the cystic duct demonstrates diffuse biliary dilatation of the intrahepatic ducts, biliary confluence, common hepatic duct, and common bile duct. Irregular filling defect in the distal CBD may represent residual choledocholithiasis versus sludge or blood clot. Contrast does not freely drain into the duodenum. IMPRESSION: Distal CBD irregular ill-defined filling defect as above. Associated biliary dilatation and obstruction. Contrast does not freely flow into the duodenum. Electronically Signed   By: Jerilynn Mages.  Shick M.D.   On: 09/07/2017 09:50   Ct Abdomen Pelvis W Contrast  Result Date:  09/04/2017 CLINICAL DATA:  Abnormal LFT.  Jaundice EXAM: CT ABDOMEN AND PELVIS WITH CONTRAST TECHNIQUE: Multidetector CT imaging of the abdomen and pelvis was performed using the standard protocol following bolus administration of intravenous contrast. CONTRAST:  <See Chart> ISOVUE-300 IOPAMIDOL (ISOVUE-300) INJECTION 61% COMPARISON:  CT abdomen pelvis 04/01/2012 FINDINGS: Lower chest: Mild left lower lobe atelectasis. Extensive coronary calcification. Small left lower lobe lung nodule on the prior study difficult to see on today's study  due to atelectasis Hepatobiliary: Biliary dilatation. Mild intrahepatic and extrahepatic biliary dilatation. Soft tissue density within the common bile duct distally may represent common bile duct stones. Gallbladder is contracted. There is gas in the fundus of the gallbladder. Gallbladder wall not significantly thickened. No focal liver mass. Pancreas: Negative Spleen: Multiple calcified granulomata.  Normal splenic size Adrenals/Urinary Tract: Multiple left renal cyst. No renal mass or obstruction. Urinary bladder normal Stomach/Bowel: Negative for bowel obstruction.  Negative for mass Vascular/Lymphatic: Extensive atherosclerotic calcification. Abdominal aortic aneurysm measuring up to 35 mm, measuring 32 mm previously. Reproductive: Mild prostate enlargement Other: 10 x 20 mm calcification medially inferior to the right lobe of the liver. This was not present previously and appears separate from bowel. Possible fat necrosis. No surrounding inflammation to suggest acute process. Musculoskeletal: Scoliosis and extensive lumbar degenerative change. X stop device at L4-5. No acute skeletal abnormality. IMPRESSION: Left lower lobe atelectasis.  Extensive coronary calcification Biliary dilatation. Soft tissue density within the common bile duct may represent gallstones or possibly tumor. In addition, there is gas within the gallbladder fundus. This may be related to recently passed  gallstones or emphysematous cholecystitis. However, the gallbladder does not appear to be acutely inflamed or thickened at this time. 10 x 20 mm calcification inferior to the right lobe liver appears nonacute without surrounding edema however this was not present on the prior study Atherosclerotic disease with abdominal aortic aneurysm 35 mm Recommend followup by ultrasound in 2 years. This recommendation follows ACR consensus guidelines: White Paper of the ACR Incidental Findings Committee II on Vascular Findings. J Am Coll Radiol 2013; 10:789-794. Electronically Signed   By: Franchot Gallo M.D.   On: 09/04/2017 19:16   Mr Abdomen Mrcp Wo Contrast  Result Date: 09/04/2017 CLINICAL DATA:  Elevated liver function studies. Gas in the gallbladder on CT. Common duct stones seen on CT. Patient has dementia and unable to hold breath. EXAM: MRI ABDOMEN WITHOUT CONTRAST  (INCLUDING MRCP) TECHNIQUE: Multiplanar multisequence MR imaging of the abdomen was performed. Heavily T2-weighted images of the biliary and pancreatic ducts were obtained, and three-dimensional MRCP images were rendered by post processing. COMPARISON:  CT abdomen and pelvis 09/04/2017 FINDINGS: Examination is technically limited due to motion artifact. Lower chest: There appear to be minimal bilateral pleural effusions. Hepatobiliary: No focal liver lesions are demonstrated. There is diffuse low signal intensity throughout the liver particularly on T2 weighted imaging. This suggests iron deposition. Apparent signal loss in the liver on out of phase imaging suggesting diffuse fatty infiltration as well. Diffuse intra and extrahepatic bile duct dilatation. Multiple filling defects seen in the mid and distal common bile duct consistent with choledocholithiasis. Gallbladder is partially contracted. Limited visualization of the gallbladder due to technique but no apparent wall thickening. There is a filling defect within the gallbladder likely representing  a gallstone. The intraluminal gas seen at CT is not appreciated on MRI. Pancreas: No focal pancreatic lesion or ductal dilatation identified. Spleen: Spleen size is normal. The spleen has a diffusely heterogeneous appearance with tiny nodular pattern demonstrated particularly on T2 weighted imaging. This may reflect changes due to iron overload or may indicate an infectious process. No specific focal lesions are identified. Adrenals/Urinary Tract: No adrenal gland nodules. Bilateral renal cysts. No hydronephrosis or hydroureter. Stomach/Bowel: Visualized portions within the abdomen are unremarkable. Vascular/Lymphatic: Infrarenal aortic ectasia with maximal AP diameter of about 3.4 cm. No significant lymphadenopathy. Other: No free intra-abdominal fluid is demonstrated. Mild edema in the dependent soft tissues. Musculoskeletal:  Degenerative changes and scoliosis of the lumbar spine. Postoperative changes in the lower lumbar spine. Multiple areas of central canal stenosis are suggested, most severe at the L5-S1 level. IMPRESSION: 1. Cholelithiasis and choledocholithiasis with associated bile duct dilatation. 2. Diffuse low signal intensity throughout the liver consistent with iron deposition. Probable diffuse fatty infiltration as well. 3. Heterogeneous appearance of the spleen may be related iron deposition or could indicate infectious process. No focal lesions. 4. Bilateral renal cysts. 5. Dilated abdominal aorta with maximal AP diameter 3.4 cm. Recommend followup by ultrasound in 3 years. This recommendation follows ACR consensus guidelines: White Paper of the ACR Incidental Findings Committee II on Vascular Findings. J Am Coll Radiol 2013; 27:062-376 6. Degenerative changes throughout the lumbar spine with areas of central canal stenosis, most prominent at the L5-S1 level. Electronically Signed   By: Lucienne Capers M.D.   On: 09/04/2017 23:06   Mr 3d Recon At Scanner  Result Date: 09/04/2017 CLINICAL DATA:   Elevated liver function studies. Gas in the gallbladder on CT. Common duct stones seen on CT. Patient has dementia and unable to hold breath. EXAM: MRI ABDOMEN WITHOUT CONTRAST  (INCLUDING MRCP) TECHNIQUE: Multiplanar multisequence MR imaging of the abdomen was performed. Heavily T2-weighted images of the biliary and pancreatic ducts were obtained, and three-dimensional MRCP images were rendered by post processing. COMPARISON:  CT abdomen and pelvis 09/04/2017 FINDINGS: Examination is technically limited due to motion artifact. Lower chest: There appear to be minimal bilateral pleural effusions. Hepatobiliary: No focal liver lesions are demonstrated. There is diffuse low signal intensity throughout the liver particularly on T2 weighted imaging. This suggests iron deposition. Apparent signal loss in the liver on out of phase imaging suggesting diffuse fatty infiltration as well. Diffuse intra and extrahepatic bile duct dilatation. Multiple filling defects seen in the mid and distal common bile duct consistent with choledocholithiasis. Gallbladder is partially contracted. Limited visualization of the gallbladder due to technique but no apparent wall thickening. There is a filling defect within the gallbladder likely representing a gallstone. The intraluminal gas seen at CT is not appreciated on MRI. Pancreas: No focal pancreatic lesion or ductal dilatation identified. Spleen: Spleen size is normal. The spleen has a diffusely heterogeneous appearance with tiny nodular pattern demonstrated particularly on T2 weighted imaging. This may reflect changes due to iron overload or may indicate an infectious process. No specific focal lesions are identified. Adrenals/Urinary Tract: No adrenal gland nodules. Bilateral renal cysts. No hydronephrosis or hydroureter. Stomach/Bowel: Visualized portions within the abdomen are unremarkable. Vascular/Lymphatic: Infrarenal aortic ectasia with maximal AP diameter of about 3.4 cm. No  significant lymphadenopathy. Other: No free intra-abdominal fluid is demonstrated. Mild edema in the dependent soft tissues. Musculoskeletal: Degenerative changes and scoliosis of the lumbar spine. Postoperative changes in the lower lumbar spine. Multiple areas of central canal stenosis are suggested, most severe at the L5-S1 level. IMPRESSION: 1. Cholelithiasis and choledocholithiasis with associated bile duct dilatation. 2. Diffuse low signal intensity throughout the liver consistent with iron deposition. Probable diffuse fatty infiltration as well. 3. Heterogeneous appearance of the spleen may be related iron deposition or could indicate infectious process. No focal lesions. 4. Bilateral renal cysts. 5. Dilated abdominal aorta with maximal AP diameter 3.4 cm. Recommend followup by ultrasound in 3 years. This recommendation follows ACR consensus guidelines: White Paper of the ACR Incidental Findings Committee II on Vascular Findings. J Am Coll Radiol 2013; 28:315-176 6. Degenerative changes throughout the lumbar spine with areas of central canal stenosis, most prominent  at the L5-S1 level. Electronically Signed   By: Lucienne Capers M.D.   On: 09/04/2017 23:06   Dg Ercp  Result Date: 09/06/2017 CLINICAL DATA:  ERCP with sphincterotomy, balloon occlusion cholangiogram and CBD stones/sludge removal. EXAM: ERCP TECHNIQUE: Multiple spot images obtained with the fluoroscopic device and submitted for interpretation post-procedure. COMPARISON:  MRCP - 09/04/2017; CT abdomen pelvis -09/04/2017 FLUOROSCOPY TIME:  7 minutes, 8 seconds FINDINGS: Ten spot intraoperative fluoroscopic images of the right upper abdominal quadrant during ERCP are provided for review Initial image demonstrates an ERCP probe overlying the right upper abdominal quadrant. There is a suspected cannulation of the pancreatic duct. Subsequent images demonstrate selective cannulation and opacification of the common bile duct with several persistent  filling defects seen within the CBD suggestive of choledocholithiasis demonstrated on preceding MRCP. Subsequent images demonstrate insufflation of a balloon within the central / mid aspect of the CBD with subsequent presumed sweeping and sphincterotomy. The common bile duct appears mildly dilated. There is minimal opacification of the central aspect of the intrahepatic biliary tree which appears very mildly dilated. There is no definitive opacification of the cystic or pancreatic ducts. IMPRESSION: ERCP with findings of choledocholithiasis with subsequent balloon sweeping and presumed sphincterotomy as detailed above. These images were submitted for radiologic interpretation only. Please see the procedural report for the amount of contrast and the fluoroscopy time utilized. Electronically Signed   By: Sandi Mariscal M.D.   On: 09/06/2017 13:37    Time Spent in minutes  25 minutes   Barton Dubois M.D on 09/09/2017 at 5:18 PM  Between 7am to 7pm - Pager - [phone]  After 7pm go to [email or web address] - password [facility]  [facility] -  Office  [phone]

## 2017-09-09 NOTE — Care Management Important Message (Signed)
Important Message  Patient Details  Name: AMI THORNSBERRY MRN: [id number] Date of Birth: [birth year]   Medicare Important Message Given:  Yes    Kerin Salen 09/09/2017, 12:53 [place] Message  Patient Details  Name: ROYALE SWAMY MRN: [id number] Date of Birth: [birth year]   Medicare Important Message Given:  Yes    Kerin Salen 09/09/2017, 12:53 PM

## 2017-09-10 ENCOUNTER — Encounter (HOSPITAL_COMMUNITY): Payer: Self-pay | Admitting: Surgery

## 2017-09-10 LAB — CULTURE, BLOOD (ROUTINE X 2)
CULTURE: NO GROWTH
Culture: NO GROWTH
SPECIAL REQUESTS: ADEQUATE
Special Requests: ADEQUATE

## 2017-09-10 LAB — GLUCOSE, CAPILLARY: Glucose-Capillary: 133 mg/dL — ABNORMAL HIGH (ref 65–99)

## 2017-09-10 MED ORDER — AMOXICILLIN-POT CLAVULANATE 500-125 MG PO TABS
1.0000 | ORAL_TABLET | Freq: Two times a day (BID) | ORAL | Status: DC
  Administered 2017-09-10 – 2017-09-11 (×2): 500 mg via ORAL
  Filled 2017-09-10 (×2): qty 1

## 2017-09-10 NOTE — Progress Notes (Signed)
Per chart review, PT recommending SNF. CSW contacted patient's daughter/HCPOA Aland Chestnutt [phone]) to discuss discharge plans. CSW informed patient's daughter that PT is recommending SNF for ST rehab, patient's daughter reported that the plan is for patient to return home with home health. Patient's daughter reported that patient is currently set up with [facility] to receive PT,OT and skilled nursing services and that services have not started due to patient's hospital admission. CSW agreed to notify patient's RNCM. CSW informed patient's RNCM that patient plans to discharge home with home health. CSW signing off, no other needs identified.   Abundio Miu, [place] Social Worker [facility] Cell#: [phone]

## 2017-09-10 NOTE — Progress Notes (Signed)
PROGRESS NOTE                                                                                                                                                                                                             Patient Demographics:    Alexander Rice, is a 83 y.o. male, DOB - 10-28-1932, IRC:[id number]  Admit date - 09/04/2017   Admitting Physician Ivor Costa, MD  Outpatient Primary MD for the patient is Yong Channel Brayton Mars, MD  LOS - 6   Chief Complaint  Patient presents with  . Weakness       Brief Narrative   83 y.o. male with medical history significant of MDS (follows with Dr. Marin Olp), hypertension, hyperlipidemia, stroke, CAD s/p of stent placement, BPH, anemia, Parkinson's disease, CKD-3, who presents with low hemoglobin, generalized weakness and fever.workup significant for profound anemia with hemoglobin of 5.2, and choledocholithiasis.transfused 2 units PRBC with good response, went for ERCP 09/06/2017 significant for with large stone burden, cleared with biliary sphincterotomy and balloon extraction.   Subjective:    Alexander Rice no CP, no SOB, no nausea, no vomiting. Patient steadily improving. Per surgery advancing diet further and transition abx's to PO. AAOX2.   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/2017 Active Problems:   Hyperlipidemia   Essential hypertension   CAD (coronary artery disease)   GERD   BPH (benign prostatic hyperplasia)   MDS (myelodysplastic syndrome) (HCC)   Pernicious anemia   Hyponatremia   Moderate dementia, without behavioral disturbance   Acute renal failure superimposed on stage 3 chronic kidney disease (HCC)   AAA (abdominal aortic aneurysm) (HCC)   Cholecystitis   Choledocholithiasis   Choledocholithiasis - MRCP significant for cholelithiasis and choledocholithiasis, with bile duct by dictation,he is a  significantly elevated bilirubin,indicative of obstructive jaundice, - GI input greatly appreciated, went for ERCP, significant for Extensive choledocholithiasis was found. almost complete removal was accomplished by biliary sphincterotomy and balloon extraction. -after discussing with surgery will stop zosyn. Patient afebrile and with normal WBC's. Will consolidate abx therapy with 5 days of augmentin. -followed by surgery, S/P laparoscopy procedure, will follow rec's. (they feel patient ok for discharge) -diet advance to heart healthy today -PT has seen patient and recommended SNF at discharge. Family decline SNF and will take  patient home with [facility] services. -LFT's trending down; will continue to monitor intermittently   symptomatic anemia - Patient with known history of MDS, pernicious anemia requiring transfusions in the past, Hemoccult-negative, transfused 3 units overnight good response, hemoglobin was 9.7 - Monitor CBC and transfuse as needed  - f/u with Dr. Niel Hummer -will resume Cyanocobalamin   Hyperlipidemia -holding statins due to elevated LFT's and NPO status. -will follow LFT's trend and will resume stains when safe  HTN: -Continue hydralazine as needed -Slowly resume oral antihypertensive regimen now that he is taking p.o.'s again  CAD (coronary artery disease): -s/p of stent. No CP currently -given profound anemia will continue hold aspirin and resume at discharge -and also holding ACE given low BP and acute on CKD; will resume at discharge if otherwise stable for it.  AKI in CK D stage III -Cr improved with IVF's and back to baseline -Pursuit oral hydration now that his diet has been advanced -follow renal function trend intermittently  -minimize nephrotoxic agents and follow pharmacy rec's for dose adjustment   GERD: -continue PPI   BPH with active urinary retention  -Will continue Cardura and attempt voiding trial tomorrow morning -if needed can go home with  foley and follow up with urology as an outpatient   Hyponatremia:  -appear to be chronic; but was worse in the presence of dehydration and GI losses. -improved and essentially at baseline -patient's diet advance to heart healthy now -will stop IVF's and follow electrolytes.  Moderate dementia, without behavioral disturbance: -Now that his diet has been advanced will resume Namenda  AAA (abdominal aortic aneurysm) ([place]): -incidental finding on imaging, atherosclerotic disease with abdominal aortic aneurysm 35 mm. -will need repeat ultrasound in 2 years and outpatient follow up.   Code Status : Full  Family Communication  : none at bedside  Disposition Plan  : diet will be advance to heart healthy, still w/o BM's. toelrating medications by mouth. Will stop IVF's. Transition antibiotics to PO. Home in 1-2 days. Family decided to take him home with [facility] services instead of SNF.   Consults  :  Gen syrgery, GI  Procedures  : 3 units PRBC 09/05/2017, ERCP 09/06/2017; laparoscopic cholecystectomy 10/20  DVT Prophylaxis  :  SCDs/ Lovenox  Lab Results  Component Value Date   PLT 292 09/09/2017    Antibiotics  :    Anti-infectives    Start     Dose/Rate Route Frequency Ordered Stop   09/10/17 2200  amoxicillin-clavulanate (AUGMENTIN) 500-125 MG per tablet 500 mg     1 tablet Oral Every 12 hours 09/10/17 1844     09/05/17 0200  piperacillin-tazobactam (ZOSYN) IVPB 3.375 g  Status:  Discontinued     3.375 g 12.5 mL/hr over 240 Minutes Intravenous Every 8 hours 09/04/17 2158 09/10/17 1844   09/04/17 2000  piperacillin-tazobactam (ZOSYN) IVPB 3.375 g     3.375 g 100 mL/hr over 30 Minutes Intravenous  Once 09/04/17 1956 09/04/17 2114        Objective:   Vitals:   09/09/17 1425 09/09/17 2043 09/10/17 0643 09/10/17 1505  BP: (!) 178/58 (!) 184/92 (!) 173/62 (!) 147/59  Pulse: 67 71 73 88  Resp: 18 19 16    Temp:  98.2 F (36.8 C) 98.2 F (36.8 C) (!) 97.5 F (36.4 C)    TempSrc: Oral Oral Oral Oral  SpO2: 97% 96% 97% 98%  Weight:      Height:        Wt Readings from  Last 3 Encounters:  09/06/17 76.7 kg (169 lb)  07/08/17 77.1 kg (170 lb)  06/17/17 76.7 kg (169 lb)     Intake/Output Summary (Last 24 hours) at 09/10/17 1844 Last data filed at 09/10/17 1828  Gross per 24 hour  Intake          4796.67 ml  Output              800 ml  Net          3996.67 ml     Physical Exam GeN: no fever, no CP, no nausea, no vomiting. Oriented to person and partially oriented to situation. No agitation, but some pleasant confusion observed. No BM's no flatus. Cardiovascular: RRR, no rubs, no gallops, no JVD Resp: scattered rhonchi, no wheezing, no crackles Abd: soft, positive BS (even decreased); no guarding. Wound from laparoscopic procedure are healing well and there is no drainage. Extremities: no cyanosis, no clubbing.    Data Review:    CBC  Recent Labs Lab 09/04/17 1220 09/04/17 2010  09/05/17 1540 09/06/17 0513 09/07/17 0534 09/08/17 0559 09/09/17 0542  WBC 10.0 9.7  < > 6.4 5.1 3.6* 6.5 6.8  HGB 7.1 Repeated and verified X2.* 5.2*  < > 9.9* 9.8* 9.8* 9.5* 9.7*  HCT 20.5 Repeated and verified X2.* 14.5*  < > 27.9* 27.5* 27.2* 27.5* 27.9*  PLT 308.0 312  < > 279 287 275 287 292  MCV 101.4* 94.8  < > 92.1 92.3 92.5 95.8 94.9  MCH  --  34.0  < > 32.7 32.9 33.3 33.1 33.0  MCHC 34.9 35.9  < > 35.5 35.6 36.0 34.5 34.8  RDW 22.7* 23.0*  < > 19.2* 19.7* 18.8* 19.1* 18.6*  LYMPHSABS 0.4* 0.5*  --   --   --   --  1.1 1.3  MONOABS 0.5 0.8  --   --   --   --  0.6 0.9  EOSABS 0.0 0.1  --   --   --   --  0.3 0.3  BASOSABS 0.0 0.0  --   --   --   --  0.0 0.0  < > = values in this interval not displayed.  Chemistries   Recent Labs Lab 09/05/17 0535 09/06/17 0513 09/07/17 0534 09/08/17 0559 09/09/17 0542  NA 125* 127* 130* 132* 131*  K 4.2 4.3 4.7 4.5 4.3  CL 98* 100* 101 102 104  CO2 18* 19* 22 23 24   GLUCOSE 105* 94 133* 109* 120*  BUN  28* 20 18 12 13   CREATININE 1.01 0.95 0.98 1.00 0.93  CALCIUM 7.7* 8.1* 8.4* 8.4* 8.0*  AST 156* 97* 58* 181* 86*  ALT 64* 85* 51 243* 198*  ALKPHOS 171* 155* 139* 122 116  BILITOT 8.2* 4.3* 2.7* 2.4* 2.2*   Coagulation profile  Recent Labs Lab 09/04/17 1220  INR 1.3*   Inpatient Medications  Scheduled Meds: . acidophilus  1 capsule Oral Daily  . amLODipine  5 mg Oral Daily  . amoxicillin-clavulanate  1 tablet Oral Q12H  . carbidopa-levodopa  0.5 tablet Oral TID  . Chlorhexidine Gluconate Cloth  6 each Topical Daily  . dorzolamide  1 drop Both Eyes BID  . doxazosin  4 mg Oral QHS  . enoxaparin (LOVENOX) injection  40 mg Subcutaneous Q24H  . latanoprost  1 drop Both Eyes QHS  . memantine  10 mg Oral BID  . multivitamin with minerals  1 tablet Oral Daily  . mupirocin ointment  1  application Nasal BID  . pantoprazole  40 mg Oral Q1200  . vitamin B-6  250 mg Oral Daily  . cyanocobalamin  100 mcg Oral Daily   Continuous Infusions: . dextrose 5 % and 0.45 % NaCl with KCl 20 mEq/L 30 mL/hr at 09/10/17 1828   PRN Meds:.hydrALAZINE, morphine injection, ondansetron **OR** ondansetron (ZOFRAN) IV, zolpidem  Micro Results Recent Results (from the past 240 hour(s))  Blood Culture (routine x 2)     Status: None   Collection Time: 09/04/17  8:10 PM  Result Value Ref Range Status   Specimen Description BLOOD LEFT ANTECUBITAL  Final   Special Requests   Final    BOTTLES DRAWN AEROBIC AND ANAEROBIC Blood Culture adequate volume   Culture   Final    NO GROWTH 5 DAYS Performed at [facility], [address], [place], [place] [address]    Report Status 09/10/2017 FINAL  Final  Blood Culture (routine x 2)     Status: None   Collection Time: 09/04/17  8:10 PM  Result Value Ref Range Status   Specimen Description BLOOD RIGHT ANTECUBITAL  Final   Special Requests   Final    BOTTLES DRAWN AEROBIC AND ANAEROBIC Blood Culture adequate volume   Culture   Final    NO GROWTH 5  DAYS Performed at [facility], [place] [address] [place], [place] [address]    Report Status 09/10/2017 FINAL  Final  Surgical pcr screen     Status: Abnormal   Collection Time: 09/06/17  9:18 PM  Result Value Ref Range Status   MRSA, PCR NEGATIVE NEGATIVE Final   Staphylococcus aureus POSITIVE (A) NEGATIVE Final    Comment: (NOTE) The Xpert SA Assay (FDA approved for NASAL specimens in patients 47 years of age and older), is one component of a comprehensive surveillance program. It is not intended to diagnose infection nor to guide or monitor treatment.     Radiology Reports Dg Chest 2 View  Result Date: 09/04/2017 CLINICAL DATA:  Cough and fevers with increased confusion EXAM: CHEST  2 VIEW COMPARISON:  05/29/2016 FINDINGS: Cardiac shadow remains enlarged. The thoracic aorta is tortuous. Calcifications are noted. Elevation of left hemidiaphragm is again seen. Mild interstitial changes are noted. Mild bibasilar atelectatic changes are noted. No focal infiltrate is noted. IMPRESSION: Mild bibasilar atelectatic changes. Aortic Atherosclerosis (ICD10-170.0) Electronically Signed   By: Inez Catalina M.D.   On: 09/04/2017 15:22   Dg Cholangiogram Operative  Result Date: 09/07/2017 CLINICAL DATA:  Cholecystectomy, intraoperative imaging EXAM: INTRAOPERATIVE CHOLANGIOGRAM TECHNIQUE: Cholangiographic images from the C-arm fluoroscopic device were submitted for interpretation post-operatively. Please see the procedural report for the amount of contrast and the fluoroscopy time utilized. COMPARISON:  09/06/2017 FINDINGS: Intraoperative cholangiogram performed during the laparoscopic procedure. Injection of the cystic duct demonstrates diffuse biliary dilatation of the intrahepatic ducts, biliary confluence, common hepatic duct, and common bile duct. Irregular filling defect in the distal CBD may represent residual choledocholithiasis versus sludge or blood clot. Contrast does not freely drain  into the duodenum. IMPRESSION: Distal CBD irregular ill-defined filling defect as above. Associated biliary dilatation and obstruction. Contrast does not freely flow into the duodenum. Electronically Signed   By: Jerilynn Mages.  Shick M.D.   On: 09/07/2017 09:50   Ct Abdomen Pelvis W Contrast  Result Date: 09/04/2017 CLINICAL DATA:  Abnormal LFT.  Jaundice EXAM: CT ABDOMEN AND PELVIS WITH CONTRAST TECHNIQUE: Multidetector CT imaging of the abdomen and pelvis was performed using the standard  protocol following bolus administration of intravenous contrast. CONTRAST:  <See Chart> ISOVUE-300 IOPAMIDOL (ISOVUE-300) INJECTION 61% COMPARISON:  CT abdomen pelvis 04/01/2012 FINDINGS: Lower chest: Mild left lower lobe atelectasis. Extensive coronary calcification. Small left lower lobe lung nodule on the prior study difficult to see on today's study due to atelectasis Hepatobiliary: Biliary dilatation. Mild intrahepatic and extrahepatic biliary dilatation. Soft tissue density within the common bile duct distally may represent common bile duct stones. Gallbladder is contracted. There is gas in the fundus of the gallbladder. Gallbladder wall not significantly thickened. No focal liver mass. Pancreas: Negative Spleen: Multiple calcified granulomata.  Normal splenic size Adrenals/Urinary Tract: Multiple left renal cyst. No renal mass or obstruction. Urinary bladder normal Stomach/Bowel: Negative for bowel obstruction.  Negative for mass Vascular/Lymphatic: Extensive atherosclerotic calcification. Abdominal aortic aneurysm measuring up to 35 mm, measuring 32 mm previously. Reproductive: Mild prostate enlargement Other: 10 x 20 mm calcification medially inferior to the right lobe of the liver. This was not present previously and appears separate from bowel. Possible fat necrosis. No surrounding inflammation to suggest acute process. Musculoskeletal: Scoliosis and extensive lumbar degenerative change. X stop device at L4-5. No acute  skeletal abnormality. IMPRESSION: Left lower lobe atelectasis.  Extensive coronary calcification Biliary dilatation. Soft tissue density within the common bile duct may represent gallstones or possibly tumor. In addition, there is gas within the gallbladder fundus. This may be related to recently passed gallstones or emphysematous cholecystitis. However, the gallbladder does not appear to be acutely inflamed or thickened at this time. 10 x 20 mm calcification inferior to the right lobe liver appears nonacute without surrounding edema however this was not present on the prior study Atherosclerotic disease with abdominal aortic aneurysm 35 mm Recommend followup by ultrasound in 2 years. This recommendation follows ACR consensus guidelines: White Paper of the ACR Incidental Findings Committee II on Vascular Findings. J Am Coll Radiol 2013; 10:789-794. Electronically Signed   By: Franchot Gallo M.D.   On: 09/04/2017 19:16   Mr Abdomen Mrcp Wo Contrast  Result Date: 09/04/2017 CLINICAL DATA:  Elevated liver function studies. Gas in the gallbladder on CT. Common duct stones seen on CT. Patient has dementia and unable to hold breath. EXAM: MRI ABDOMEN WITHOUT CONTRAST  (INCLUDING MRCP) TECHNIQUE: Multiplanar multisequence MR imaging of the abdomen was performed. Heavily T2-weighted images of the biliary and pancreatic ducts were obtained, and three-dimensional MRCP images were rendered by post processing. COMPARISON:  CT abdomen and pelvis 09/04/2017 FINDINGS: Examination is technically limited due to motion artifact. Lower chest: There appear to be minimal bilateral pleural effusions. Hepatobiliary: No focal liver lesions are demonstrated. There is diffuse low signal intensity throughout the liver particularly on T2 weighted imaging. This suggests iron deposition. Apparent signal loss in the liver on out of phase imaging suggesting diffuse fatty infiltration as well. Diffuse intra and extrahepatic bile duct  dilatation. Multiple filling defects seen in the mid and distal common bile duct consistent with choledocholithiasis. Gallbladder is partially contracted. Limited visualization of the gallbladder due to technique but no apparent wall thickening. There is a filling defect within the gallbladder likely representing a gallstone. The intraluminal gas seen at CT is not appreciated on MRI. Pancreas: No focal pancreatic lesion or ductal dilatation identified. Spleen: Spleen size is normal. The spleen has a diffusely heterogeneous appearance with tiny nodular pattern demonstrated particularly on T2 weighted imaging. This may reflect changes due to iron overload or may indicate an infectious process. No specific focal lesions are identified. Adrenals/Urinary Tract:  No adrenal gland nodules. Bilateral renal cysts. No hydronephrosis or hydroureter. Stomach/Bowel: Visualized portions within the abdomen are unremarkable. Vascular/Lymphatic: Infrarenal aortic ectasia with maximal AP diameter of about 3.4 cm. No significant lymphadenopathy. Other: No free intra-abdominal fluid is demonstrated. Mild edema in the dependent soft tissues. Musculoskeletal: Degenerative changes and scoliosis of the lumbar spine. Postoperative changes in the lower lumbar spine. Multiple areas of central canal stenosis are suggested, most severe at the L5-S1 level. IMPRESSION: 1. Cholelithiasis and choledocholithiasis with associated bile duct dilatation. 2. Diffuse low signal intensity throughout the liver consistent with iron deposition. Probable diffuse fatty infiltration as well. 3. Heterogeneous appearance of the spleen may be related iron deposition or could indicate infectious process. No focal lesions. 4. Bilateral renal cysts. 5. Dilated abdominal aorta with maximal AP diameter 3.4 cm. Recommend followup by ultrasound in 3 years. This recommendation follows ACR consensus guidelines: White Paper of the ACR Incidental Findings Committee II on  Vascular Findings. J Am Coll Radiol 2013; 37:106-269 6. Degenerative changes throughout the lumbar spine with areas of central canal stenosis, most prominent at the L5-S1 level. Electronically Signed   By: Lucienne Capers M.D.   On: 09/04/2017 23:06   Mr 3d Recon At Scanner  Result Date: 09/04/2017 CLINICAL DATA:  Elevated liver function studies. Gas in the gallbladder on CT. Common duct stones seen on CT. Patient has dementia and unable to hold breath. EXAM: MRI ABDOMEN WITHOUT CONTRAST  (INCLUDING MRCP) TECHNIQUE: Multiplanar multisequence MR imaging of the abdomen was performed. Heavily T2-weighted images of the biliary and pancreatic ducts were obtained, and three-dimensional MRCP images were rendered by post processing. COMPARISON:  CT abdomen and pelvis 09/04/2017 FINDINGS: Examination is technically limited due to motion artifact. Lower chest: There appear to be minimal bilateral pleural effusions. Hepatobiliary: No focal liver lesions are demonstrated. There is diffuse low signal intensity throughout the liver particularly on T2 weighted imaging. This suggests iron deposition. Apparent signal loss in the liver on out of phase imaging suggesting diffuse fatty infiltration as well. Diffuse intra and extrahepatic bile duct dilatation. Multiple filling defects seen in the mid and distal common bile duct consistent with choledocholithiasis. Gallbladder is partially contracted. Limited visualization of the gallbladder due to technique but no apparent wall thickening. There is a filling defect within the gallbladder likely representing a gallstone. The intraluminal gas seen at CT is not appreciated on MRI. Pancreas: No focal pancreatic lesion or ductal dilatation identified. Spleen: Spleen size is normal. The spleen has a diffusely heterogeneous appearance with tiny nodular pattern demonstrated particularly on T2 weighted imaging. This may reflect changes due to iron overload or may indicate an infectious  process. No specific focal lesions are identified. Adrenals/Urinary Tract: No adrenal gland nodules. Bilateral renal cysts. No hydronephrosis or hydroureter. Stomach/Bowel: Visualized portions within the abdomen are unremarkable. Vascular/Lymphatic: Infrarenal aortic ectasia with maximal AP diameter of about 3.4 cm. No significant lymphadenopathy. Other: No free intra-abdominal fluid is demonstrated. Mild edema in the dependent soft tissues. Musculoskeletal: Degenerative changes and scoliosis of the lumbar spine. Postoperative changes in the lower lumbar spine. Multiple areas of central canal stenosis are suggested, most severe at the L5-S1 level. IMPRESSION: 1. Cholelithiasis and choledocholithiasis with associated bile duct dilatation. 2. Diffuse low signal intensity throughout the liver consistent with iron deposition. Probable diffuse fatty infiltration as well. 3. Heterogeneous appearance of the spleen may be related iron deposition or could indicate infectious process. No focal lesions. 4. Bilateral renal cysts. 5. Dilated abdominal aorta with maximal AP  diameter 3.4 cm. Recommend followup by ultrasound in 3 years. This recommendation follows ACR consensus guidelines: White Paper of the ACR Incidental Findings Committee II on Vascular Findings. J Am Coll Radiol 2013; 19:417-408 6. Degenerative changes throughout the lumbar spine with areas of central canal stenosis, most prominent at the L5-S1 level. Electronically Signed   By: Lucienne Capers M.D.   On: 09/04/2017 23:06   Dg Ercp  Result Date: 09/06/2017 CLINICAL DATA:  ERCP with sphincterotomy, balloon occlusion cholangiogram and CBD stones/sludge removal. EXAM: ERCP TECHNIQUE: Multiple spot images obtained with the fluoroscopic device and submitted for interpretation post-procedure. COMPARISON:  MRCP - 09/04/2017; CT abdomen pelvis -09/04/2017 FLUOROSCOPY TIME:  7 minutes, 8 seconds FINDINGS: Ten spot intraoperative fluoroscopic images of the right  upper abdominal quadrant during ERCP are provided for review Initial image demonstrates an ERCP probe overlying the right upper abdominal quadrant. There is a suspected cannulation of the pancreatic duct. Subsequent images demonstrate selective cannulation and opacification of the common bile duct with several persistent filling defects seen within the CBD suggestive of choledocholithiasis demonstrated on preceding MRCP. Subsequent images demonstrate insufflation of a balloon within the central / mid aspect of the CBD with subsequent presumed sweeping and sphincterotomy. The common bile duct appears mildly dilated. There is minimal opacification of the central aspect of the intrahepatic biliary tree which appears very mildly dilated. There is no definitive opacification of the cystic or pancreatic ducts. IMPRESSION: ERCP with findings of choledocholithiasis with subsequent balloon sweeping and presumed sphincterotomy as detailed above. These images were submitted for radiologic interpretation only. Please see the procedural report for the amount of contrast and the fluoroscopy time utilized. Electronically Signed   By: Sandi Mariscal M.D.   On: 09/06/2017 13:37    Time Spent in minutes  25 minutes   Barton Dubois M.D on 09/10/2017 at 6:44 PM  Between 7am to 7pm - Pager - [phone]  After 7pm go to [email or web address] - password [facility]  [facility] -  Office  [phone]

## 2017-09-10 NOTE — Progress Notes (Signed)
Physical Therapy Treatment Patient Details Name: Alexander Rice MRN: [id number] DOB: [birth year] Today's Date: 09/10/2017    History of Present Illness 83 y.o. male with medical history significant of MDS (follows with Dr. 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  Pt's lunch in room so placed tray in front of pt and RN/NT into room to assist with feeding.  Pt requesting sprite so provided this as well (Okay with RN).   Follow Up Recommendations  SNF     Equipment Recommendations  Rolling walker with 5" wheels    Recommendations for Other Services       Precautions / Restrictions Precautions Precautions: Fall    Mobility  Bed Mobility Overal bed mobility: Needs Assistance Bed Mobility: Supine to Sit     Supine to sit: Max assist;HOB elevated     General bed mobility comments: increased assist for upper and lower body; pt better able to assist with upper body today  Transfers Overall transfer level: Needs assistance Equipment used: Rolling walker (2 wheeled) Transfers: Sit to/from Stand Sit to Stand: From elevated surface;Min assist         General transfer comment: verbal cues for technique, assist to rise and steady as well as control descent  Ambulation/Gait Ambulation/Gait assistance: Min assist Ambulation Distance (Feet): 40 Feet Assistive device: Rolling walker (2 wheeled) Gait Pattern/deviations: Step-through pattern;Decreased stride length;Trunk flexed     General Gait Details: max verbal cues for RW positioning and posture, assist for maneuvering RW especially with turns   Stairs            Wheelchair Mobility    Modified Rankin (Stroke Patients  Only)       Balance                                            Cognition Arousal/Alertness: Awake/alert Behavior During Therapy: WFL for tasks assessed/performed Overall Cognitive Status: History of cognitive impairments - at baseline                                 General Comments: pleasantly confused, follows commands with increased time, no family present      Exercises      General Comments        Pertinent Vitals/Pain Pain Assessment: No/denies pain    Home Living                      Prior Function            PT Goals (current goals can now be found in the care plan section) Progress towards PT goals: Progressing toward goals    Frequency    Min 3X/week      PT Plan Current plan remains appropriate    Co-evaluation              AM-PAC PT "6 Clicks" Daily Activity  Outcome Measure  Difficulty turning over in bed (including adjusting bedclothes, sheets and blankets)?: Unable Difficulty moving from lying on back to sitting on the side of the bed? : Unable Difficulty sitting down on and standing up  from a chair with arms (e.g., wheelchair, bedside commode, etc,.)?: Unable Help needed moving to and from a bed to chair (including a wheelchair)?: A Little Help needed walking in hospital room?: A Little Help needed climbing 3-5 steps with a railing? : Total 6 Click Score: 10    End of Session Equipment Utilized During Treatment: Gait belt Activity Tolerance: Patient limited by fatigue Patient left: in chair;with call bell/phone within reach;with chair alarm set;with nursing/sitter in room Nurse Communication: Mobility status PT Visit Diagnosis: Difficulty in walking, not elsewhere classified (R26.2)     Time: 1347-1401 PT Time Calculation (min) (ACUTE ONLY): 14 min  Charges:  $Gait Training: 8-22 mins                    G Codes:      Alexander Rice, PT, DPT 09/10/2017 Pager:  [phone]  York Ram E 09/10/2017, 3:18 PM

## 2017-09-10 NOTE — Discharge Instructions (Signed)
Please arrive at least 30 min before your appointment to complete your check in paperwork.  If you are unable to arrive 30 min prior to your appointment time we may have to cancel or reschedule you. ° °LAPAROSCOPIC SURGERY: POST OP INSTRUCTIONS  °1. DIET: Follow a light bland diet the first 24 hours after arrival home, such as soup, liquids, crackers, etc. Be sure to include lots of fluids daily. Avoid fast food or heavy meals as your are more likely to get nauseated. Eat a low fat the next few days after surgery.  °2. Take your usually prescribed home medications unless otherwise directed. °3. PAIN CONTROL:  °1. Pain is best controlled by a usual combination of three different methods TOGETHER:  °1. Ice/Heat °2. Over the counter pain medication °3. Prescription pain medication °2. Most patients will experience some swelling and bruising around the incisions. Ice packs or heating pads (30-60 minutes up to 6 times a day) will help. Use ice for the first few days to help decrease swelling and bruising, then switch to heat to help relax tight/sore spots and speed recovery. Some people prefer to use ice alone, heat alone, alternating between ice & heat. Experiment to what works for you. Swelling and bruising can take several weeks to resolve.  °3. It is helpful to take an over-the-counter pain medication regularly for the first few weeks. Choose one of the following that works best for you:  °1. Naproxen (Aleve, etc) Two 220mg tabs twice a day °2. Ibuprofen (Advil, etc) Three 200mg tabs four times a day (every meal & bedtime) °3. Acetaminophen (Tylenol, etc) 500-650mg four times a day (every meal & bedtime) °4. A prescription for pain medication (such as oxycodone, hydrocodone, etc) should be given to you upon discharge. Take your pain medication as prescribed.  °1. If you are having problems/concerns with the prescription medicine (does not control pain, nausea, vomiting, rash, itching, etc), please call us [phone] to see if we need to switch you to a different pain medicine that will work better for you and/or control your side effect better. °2. If you need a refill on your pain medication, please contact your pharmacy. They will contact our office to request authorization. Prescriptions will not be filled after 5 pm or on week-ends. °4. Avoid getting constipated. Between the surgery and the pain medications, it is common to experience some constipation. Increasing fluid intake and taking a fiber supplement (such as Metamucil, Citrucel, FiberCon, MiraLax, etc) 1-2 times a day regularly will usually help prevent this problem from occurring. A mild laxative (prune juice, Milk of Magnesia, MiraLax, etc) should be taken according to package directions if there are no bowel movements after 48 hours.  °5. Watch out for diarrhea. If you have many loose bowel movements, simplify your diet to bland foods & liquids for a few days. Stop any stool softeners and decrease your fiber supplement. Switching to mild anti-diarrheal medications (Kayopectate, Pepto Bismol) can help. If this worsens or does not improve, please call us. °6. Wash / shower every day. You may shower over the dressings as they are waterproof. Continue to shower over incision(s) after the dressing is off. °7. Remove your waterproof bandages 5 days after surgery. You may leave the incision open to air. You may replace a dressing/Band-Aid to cover the incision for comfort if you wish.  °8. ACTIVITIES as tolerated:  °1. You may resume regular (light) daily activities beginning the next day--such as daily self-care, walking, climbing stairs--gradually   increasing activities as tolerated. If you can walk 30 minutes without difficulty, it is safe to try more intense activity such as jogging, treadmill, bicycling, low-impact aerobics, swimming, etc. °2. Save the most intensive and strenuous activity for last such as sit-ups, heavy lifting, contact sports, etc Refrain  from any heavy lifting or straining until you are off narcotics for pain control.  °3. DO NOT PUSH THROUGH PAIN. Let pain be your guide: If it hurts to do something, don't do it. Pain is your body warning you to avoid that activity for another week until the pain goes down. °4. You may drive when you are no longer taking prescription pain medication, you can comfortably wear a seatbelt, and you can safely maneuver your car and apply brakes. °5. You may have sexual intercourse when it is comfortable.  °9. FOLLOW UP in our office  °1. Please call [facility] at [phone] to set up an appointment to see your surgeon in the office for a follow-up appointment approximately 2-3 weeks after your surgery. °2. Make sure that you call for this appointment the day you arrive home to insure a convenient appointment time. °     10. IF YOU HAVE DISABILITY OR FAMILY LEAVE FORMS, BRING THEM TO THE               OFFICE FOR PROCESSING.  ° °WHEN TO CALL US [phone]:  °1. Poor pain control °2. Reactions / problems with new medications (rash/itching, nausea, etc)  °3. Fever over 101.5 F (38.5 C) °4. Inability to urinate °5. Nausea and/or vomiting °6. Worsening swelling or bruising °7. Continued bleeding from incision. °8. Increased pain, redness, or drainage from the incision ° °The clinic staff is available to answer your questions during regular business hours (8:30am-5pm). Please don’t hesitate to call and ask to speak to one of our nurses for clinical concerns.  °If you have a medical emergency, go to the nearest emergency room or call 911.  °A surgeon from [facility] is always on call at the hospitals  ° °[facility]  °[address], [address], [place], [place] [address] ?  °MAIN: [phone] ? TOLL FREE: [phone] ?  °FAX [phone]  °[email or web address] ° °

## 2017-09-10 NOTE — Progress Notes (Signed)
Spoke to dtr [employer] c# [phone] HHC is the d/c plan-explained HHC(intermittent, & private duty care(out of pocket cost,independent decision)-dtr voiced understanding-Will leave [place] list, & private duty care list in rm.[facility]HR[facility]/PT/OT/aide/CSW to be ordered. Patient has rw. Will need ambulance transp @ d/c-confirmed address. TC [facility] rep Sarah-left message,TC main office spoke to [place] who confirmed patient is active w/[facility], they will accept back. Await [place] orders.

## 2017-09-10 NOTE — Care Management Note (Signed)
Case Management Note  Patient Details  Name: RODERIC LAMMERT MRN: [id number] Date of Birth: [birth year]  Subjective/Objective: Noted per CSW-HCPOA [place] declines SNF. TC HCPOA Kathyrn left vm message to confirm if HHC is the dc plan & declining SNF-await call back. Noted [facility] was ordered prior to admission-but was not able to start services d/t patient admitted to hospital-[facility] HHRN/PT/OT/aide/csw- await call back.                   Action/Plan:d/c plan home w/HHC.   Expected Discharge Date:                  Expected Discharge Plan:  [place]  In-House Referral:     Discharge planning Services     Post Acute Care Choice:    Choice offered to:  [facility] POA / Guardian  DME Arranged:    DME Agency:     HH Arranged:  RN, PT, OT, Nurse's Aide, Social Work [employer] Agency:  [facility]  Status of Service:  Completed, signed off  If discussed at [employer], dates discussed:    Additional Comments:  Dessa Phi, RN 09/10/2017, 2:30 PM

## 2017-09-10 NOTE — Progress Notes (Signed)
[facility] Progress Note  3 Days Post-Op  Subjective: CC: no complaints Patient denies abdominal pain. Says he isn't hungry right now because he ate this morning.   Objective: Vital signs in last 24 hours: Temp:  [98.2 F (36.8 C)] 98.2 F (36.8 C) (10/23 0643) Pulse Rate:  [67-73] 73 (10/23 0643) Resp:  [16-19] 16 (10/23 0643) BP: (173-184)/(58-92) 173/62 (10/23 0643) SpO2:  [96 %-97 %] 97 % (10/23 0643) Last BM Date: 09/04/17  Intake/Output from previous day: 10/22 0701 - 10/23 0700 In: 2561.7 [I.V.:2411.7; IV Piggyback:150] Out: 800 [Urine:800] Intake/Output this shift: No intake/output data recorded.  PE: Gen:  Alert, NAD, pleasant Card:  Regular rate and rhythm Pulm:  Normal effort, clear to auscultation bilaterally Abd: Soft, non-tender, non-distended, bowel sounds present in all 4 quadrants, no HSM, incisions C/D/I Skin: warm and dry, no rashes  Psych: A&Ox3   Lab Results:   Recent Labs  09/08/17 0559 09/09/17 0542  WBC 6.5 6.8  HGB 9.5* 9.7*  HCT 27.5* 27.9*  PLT 287 292   BMET  Recent Labs  09/08/17 0559 09/09/17 0542  NA 132* 131*  K 4.5 4.3  CL 102 104  CO2 23 24  GLUCOSE 109* 120*  BUN 12 13  CREATININE 1.00 0.93  CALCIUM 8.4* 8.0*   PT/INR No results for input(s): LABPROT, INR in the last 72 hours. CMP     Component Value Date/Time   NA 131 (L) 09/09/2017 0542   NA 136 08/20/2017 1150   NA 131 (L) 07/30/2016 1039   K 4.3 09/09/2017 0542   K 4.5 08/20/2017 1150   K 4.4 07/30/2016 1039   CL 104 09/09/2017 0542   CL 104 08/20/2017 1150   CO2 24 09/09/2017 0542   CO2 27 08/20/2017 1150   CO2 21 (L) 07/30/2016 1039   GLUCOSE 120 (H) 09/09/2017 0542   GLUCOSE 97 08/20/2017 1150   BUN 13 09/09/2017 0542   BUN 20 08/20/2017 1150   BUN 20.3 07/30/2016 1039   CREATININE 0.93 09/09/2017 0542   CREATININE 1.1 08/20/2017 1150   CREATININE 0.9 07/30/2016 1039   CALCIUM 8.0 (L) 09/09/2017 0542   CALCIUM 9.0 08/20/2017  1150   CALCIUM 8.7 07/30/2016 1039   PROT 4.9 (L) 09/09/2017 0542   PROT 6.1 (L) 08/20/2017 1150   PROT 5.8 (L) 07/30/2016 1039   ALBUMIN 2.5 (L) 09/09/2017 0542   ALBUMIN 3.8 08/20/2017 1150   ALBUMIN 3.8 03/04/2017 1516   ALBUMIN 3.6 07/30/2016 1039   AST 86 (H) 09/09/2017 0542   AST 28 08/20/2017 1150   AST 17 07/30/2016 1039   ALT 198 (H) 09/09/2017 0542   ALT 16 08/20/2017 1150   ALT 18 07/30/2016 1039   ALKPHOS 116 09/09/2017 0542   ALKPHOS 66 08/20/2017 1150   ALKPHOS 47 07/30/2016 1039   BILITOT 2.2 (H) 09/09/2017 0542   BILITOT 1.10 08/20/2017 1150   BILITOT 0.93 07/30/2016 1039   GFRNONAA >60 09/09/2017 0542   GFRAA >60 09/09/2017 0542   Lipase     Component Value Date/Time   LIPASE 56 (H) 09/07/2017 0534     Studies/Results: No results found.  Anti-infectives: Anti-infectives    Start     Dose/Rate Route Frequency Ordered Stop   09/05/17 0200  piperacillin-tazobactam (ZOSYN) IVPB 3.375 g     3.375 g 12.5 mL/hr over 240 Minutes Intravenous Every 8 hours 09/04/17 2158     09/04/17 2000  piperacillin-tazobactam (ZOSYN) IVPB 3.375 g  3.375 g 100 mL/hr over 30 Minutes Intravenous  Once 09/04/17 1956 09/04/17 2114       Assessment/Plan Dementia HTN AAA HLD CAD GERD BPH  Choledocholithiasis with Acute cholecystitis S/P lap chole +IOC , repair of cholecystoduodenal fistula, upper endoscopy - 09/07/17 Dr. Lucia Gaskins S/P ERCP 09/06/17 Dr Ardis Hughs - POD#3 - advance diet - mobilize with assistance - can send patient home on 5 days PO augmentin  FEN: advance diet to HH/CM VTE: SCDs, lovenox ID: Zosyn (10/17>>), WBC 6.8, afebrile Follow up: [facility] clinic 11/6  Plan: Advance diet. Mobilize patient. Continue antibiotics. Patient is stable for discharge from a surgical standpoint, will need to f/u with [facility].   LOS: 6 days    Brigid Re , [facility] 09/10/2017, 10:46 AM Pager: [phone] Consults: [phone] Mon-Fri 7:00  am-4:30 pm Sat-Sun 7:00 am-11:30 am

## 2017-09-11 DIAGNOSIS — K316 Fistula of stomach and duodenum: Secondary | ICD-10-CM

## 2017-09-11 DIAGNOSIS — R945 Abnormal results of liver function studies: Secondary | ICD-10-CM

## 2017-09-11 LAB — COMPREHENSIVE METABOLIC PANEL
ALBUMIN: 2.4 g/dL — AB (ref 3.5–5.0)
ALK PHOS: 93 U/L (ref 38–126)
ALT: 62 U/L (ref 17–63)
ANION GAP: 6 (ref 5–15)
AST: 42 U/L — AB (ref 15–41)
BILIRUBIN TOTAL: 1.8 mg/dL — AB (ref 0.3–1.2)
BUN: 11 mg/dL (ref 6–20)
CO2: 21 mmol/L — AB (ref 22–32)
Calcium: 7.8 mg/dL — ABNORMAL LOW (ref 8.9–10.3)
Chloride: 97 mmol/L — ABNORMAL LOW (ref 101–111)
Creatinine, Ser: 0.88 mg/dL (ref 0.61–1.24)
GFR calc Af Amer: >60 mL/min (ref >60)
GFR calc non Af Amer: >60 mL/min (ref >60)
GLUCOSE: 92 mg/dL (ref 65–99)
POTASSIUM: 4.1 mmol/L (ref 3.5–5.1)
SODIUM: 124 mmol/L — AB (ref 135–145)
Total Protein: 4.6 g/dL — ABNORMAL LOW (ref 6.5–8.1)

## 2017-09-11 LAB — SODIUM: Sodium: 128 mmol/L — ABNORMAL LOW (ref 135–145)

## 2017-09-11 LAB — GLUCOSE, CAPILLARY
Glucose-Capillary: 83 mg/dL (ref 65–99)
Glucose-Capillary: 121 mg/dL — ABNORMAL HIGH (ref 65–99)

## 2017-09-11 MED ORDER — MORPHINE SULFATE (PF) 4 MG/ML IV SOLN: 1.0000 mg | INTRAVENOUS | Status: DC | PRN

## 2017-09-11 MED ORDER — SODIUM CHLORIDE 1 G PO TABS
1.0000 g | ORAL_TABLET | Freq: Two times a day (BID) | ORAL | Status: DC
  Administered 2017-09-11 – 2017-09-12 (×2): 1 g via ORAL
  Filled 2017-09-11 (×3): qty 1

## 2017-09-11 MED ORDER — AMOXICILLIN-POT CLAVULANATE 875-125 MG PO TABS
1.0000 | ORAL_TABLET | Freq: Two times a day (BID) | ORAL | Status: DC
  Administered 2017-09-11 – 2017-09-12 (×2): 1 via ORAL
  Filled 2017-09-11 (×3): qty 1

## 2017-09-11 MED ORDER — DOCUSATE SODIUM 100 MG PO CAPS
100.0000 mg | ORAL_CAPSULE | Freq: Two times a day (BID) | ORAL | Status: DC
  Administered 2017-09-11 – 2017-09-12 (×3): 100 mg via ORAL
  Filled 2017-09-11 (×4): qty 1

## 2017-09-11 NOTE — Progress Notes (Signed)
PROGRESS NOTE    Alexander Rice  XBM:[id number] DOB: Aug 20, 1932 DOA: 09/04/2017 PCP: Marin Olp, MD    Brief Narrative: 83 y.o.malewith medical history significant of MDS (follows with Dr. Marin Olp), hypertension, hyperlipidemia, stroke,CAD s/p of stent placement, BPH, anemia, Parkinson's disease, CKD-3, who presents with low hemoglobin, generalized weakness and fever.workup significant for profound anemia with hemoglobin of 5.2, and choledocholithiasis.transfused 2 units PRBC with good response, went for ERCP 09/06/2017 significant for with large stone burden, cleared with biliary sphincterotomy and balloon extraction. 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  Surgery and GI on board and appreciate recommendations.  lfts improving.  Plan for dc in am.   Symptomatic anemia:  Probably from anemia of chronic disease from MDS.  Received 3 units of prbc transfusion during the hospitalization.  follow up with Dr Marin Olp as outpatient.  Stable H&H.    Hypertension; well controlled.    Hyperlipidemia:  Holding statins for now.    aki on stage 3 CKD: Mild improvement in creatinine.   Hyponatremia:  Appears to be chronic. Worse today.  Gently hydrate and recheck in am.         DVT prophylaxis: lovenox/ scd;s Code Status full code.  Family  Communication: none at bedside.  Disposition Plan: home in am.    Consultants:   GI / surgery.    Procedures: 3 units PRBC 09/05/2017, ERCP 09/06/2017; laparoscopic cholecystectomy 10/20   Antimicrobials: augmentin to complete the course.    Subjective: No new complaints.   Objective: Vitals:   09/10/17 1505 09/10/17 2050 09/11/17 0500 09/11/17 1342  BP: (!) 147/59 (!) 164/60 (!) 147/44 (!) 165/66  Pulse: 88 66 66 69  Resp:  18 18 18   Temp: (!) 97.5 F (36.4 C) 98.2 F (36.8 C) 98.6 F (37 C) 98.4 F (36.9 C)  TempSrc: Oral Oral Oral Oral  SpO2: 98% 99% 97% 97%  Weight:      Height:        Intake/Output Summary (Last 24 hours) at 09/11/17 1744 Last data filed at 09/11/17 1400  Gross per 24 hour  Intake          1246.67 ml  Output             2600 ml  Net         -1353.33 ml   Filed Weights   09/04/17 2017 09/06/17 1134  Weight: 76.7 kg (169 lb) 76.7 kg (169 lb)    Examination:  General exam: Appears calm and comfortable  Respiratory system: Clear to auscultation. Respiratory effort normal. Cardiovascular system: S1 & S2 heard, RRR. No JVD, murmurs, rubs, gallops or clicks. No pedal edema. Gastrointestinal system: Abdomen is nondistended, soft and nontender. No organomegaly or masses felt. Normal bowel sounds heard. Central nervous system: Alert. No focal neurological deficits. Extremities: Symmetric 5 x 5 power. Skin: No rashes, lesions or ulcers Psychiatry: . Mood & affect appropriate.  Data Reviewed: I have personally reviewed following labs and imaging studies  CBC:  Recent Labs Lab 09/04/17 2010  09/05/17 1540 09/06/17 0513 09/07/17 0534 09/08/17 0559 09/09/17 0542  WBC 9.7  < > 6.4 5.1 3.6* 6.5 6.8  NEUTROABS 8.3*  --   --   --   --  4.5 4.3  HGB 5.2*  < > 9.9* 9.8* 9.8* 9.5* 9.7*  HCT 14.5*  < > 27.9* 27.5* 27.2* 27.5* 27.9*  MCV 94.8  < > 92.1 92.3 92.5 95.8 94.9  PLT 312  < > 279 287 275 287 292  < > = values in this interval  not displayed. Basic Metabolic Panel:  Recent Labs Lab 09/06/17 0513 09/07/17 0534 09/08/17 0559 09/09/17 0542 09/11/17 0516 09/11/17 1222  NA 127* 130* 132* 131* 124* 128*  K 4.3 4.7 4.5 4.3 4.1  --   CL 100* 101 102 104 97*  --   CO2 19* 22 23 24  21*  --   GLUCOSE 94 133* 109* 120* 92  --   BUN 20 18 12 13 11   --   CREATININE 0.95 0.98 1.00 0.93 0.88  --   CALCIUM 8.1* 8.4* 8.4* 8.0* 7.8*  --    GFR: Estimated Creatinine Clearance: 64.7 mL/min (by C-G formula based on SCr of 0.88 mg/dL). Liver Function Tests:  Recent Labs Lab 09/06/17 0513 09/07/17 0534 09/08/17 0559 09/09/17 0542 09/11/17 0516  AST 97* 58* 181* 86* 42*  ALT 85* 51 243* 198* 62  ALKPHOS 155* 139* 122 116 93  BILITOT 4.3* 2.7* 2.4* 2.2* 1.8*  PROT 5.2* 5.0* 4.9* 4.9* 4.6*  ALBUMIN 2.9* 2.7* 2.8* 2.5* 2.4*    Recent Labs Lab 09/07/17 0534  LIPASE 56*   No results for input(s): AMMONIA in the last 168 hours. Coagulation Profile: No results for input(s): INR, PROTIME in the last 168 hours. Cardiac Enzymes: No results for input(s): CKTOTAL, CKMB, CKMBINDEX, TROPONINI in the last 168 hours. BNP (last 3 results) No results for input(s): PROBNP in the last 8760 hours. HbA1C: No results for input(s): HGBA1C in the last 72 hours. CBG:  Recent Labs Lab 09/07/17 1241 09/07/17 1835 09/08/17 0732 09/10/17 0737 09/11/17 0741  GLUCAP 117* 97 109* 133* 83   Lipid Profile: No results for input(s): CHOL, HDL, LDLCALC, TRIG, CHOLHDL, LDLDIRECT in the last 72 hours. Thyroid Function Tests: No results for input(s): TSH, T4TOTAL, FREET4, T3FREE, THYROIDAB in the last 72 hours. Anemia Panel: No results for input(s): VITAMINB12, FOLATE, FERRITIN, TIBC, IRON, RETICCTPCT in the last 72 hours. Sepsis Labs:  Recent Labs Lab 09/04/17 2019  LATICACIDVEN 1.37    Recent Results (from the past 240 hour(s))  Blood Culture (routine x 2)     Status: None   Collection Time: 09/04/17  8:10 PM  Result Value  Ref Range Status   Specimen Description BLOOD LEFT ANTECUBITAL  Final   Special Requests   Final    BOTTLES DRAWN AEROBIC AND ANAEROBIC Blood Culture adequate volume   Culture   Final    NO GROWTH 5 DAYS Performed at [facility], [address] [place], [place] [address]    Report Status 09/10/2017 FINAL  Final  Blood Culture (routine x 2)     Status: None   Collection Time: 09/04/17  8:10 PM  Result Value Ref Range Status   Specimen Description BLOOD RIGHT ANTECUBITAL  Final   Special Requests   Final    BOTTLES DRAWN AEROBIC AND ANAEROBIC Blood Culture  adequate volume   Culture   Final    NO GROWTH 5 DAYS Performed at [facility], [place] [address], [place], [place] [address]    Report Status 09/10/2017 FINAL  Final  Surgical pcr screen     Status: Abnormal   Collection Time: 09/06/17  9:18 PM  Result Value Ref Range Status   MRSA, PCR NEGATIVE NEGATIVE Final   Staphylococcus aureus POSITIVE (A) NEGATIVE Final    Comment: (NOTE) The Xpert SA Assay (FDA approved for NASAL specimens in patients 104 years of age and older), is one component of a comprehensive surveillance program. It is not intended to diagnose infection nor to guide or monitor treatment.          Radiology Studies: No results found.      Scheduled Meds: . acidophilus  1 capsule Oral Daily  . amLODipine  5 mg Oral Daily  . amoxicillin-clavulanate  1 tablet Oral Q12H  . carbidopa-levodopa  0.5 tablet Oral TID  . Chlorhexidine Gluconate Cloth  6 each Topical Daily  . docusate sodium  100 mg Oral BID  . dorzolamide  1 drop Both Eyes BID  . doxazosin  4 mg Oral QHS  . enoxaparin (LOVENOX) injection  40 mg Subcutaneous Q24H  . latanoprost  1 drop Both Eyes QHS  . memantine  10 mg Oral BID  . multivitamin with minerals  1 tablet Oral Daily  . mupirocin ointment  1 application Nasal BID  . pantoprazole  40 mg Oral Q1200  . vitamin B-6  250 mg Oral Daily  . sodium chloride  1 g Oral BID  WC  . cyanocobalamin  100 mcg Oral Daily   Continuous Infusions:   LOS: 7 days    Time spent: 36 minutes    Shatori Bertucci, MD [facility] Pager [phone]  If 7PM-7AM, please contact night-coverage [email or web address] Password [facility] 09/11/2017, 5:44 PM

## 2017-09-11 NOTE — Care Management Note (Signed)
Case Management Note  Patient Details  Name: Alexander Rice MRN: [id number] Date of Birth: 16-Aug-1932  Subjective/Objective: PTAR forms on shadow chart for nsg to call MAYO [phone] when ready.[facility] already set up. No further CM needs.                   Action/Plan:d/c home w/HHC by PTAR   Expected Discharge Date:                  Expected Discharge Plan:  [place]  In-House Referral:     Discharge planning Services     Post Acute Care Choice:    Choice offered to:  [facility] POA / Guardian  DME Arranged:    DME Agency:     HH Arranged:  RN, PT, OT, Nurse's Aide, Social Work [employer] Agency:  [facility]  Status of Service:  Completed, signed off  If discussed at [employer], dates discussed:    Additional Comments:  Dessa Phi, RN 09/11/2017, 12:57 PM

## 2017-09-11 NOTE — Progress Notes (Signed)
[facility] Progress Note  4 Days Post-Op  Subjective: CC: no complaints Patient resting comfortably in bed, no family present at bedside. Patient tells me he is eating ok. Unsure of whether he has had a BM. Denies abdominal pain. UOP good. VSS.   Objective: Vital signs in last 24 hours: Temp:  [97.5 F (36.4 C)-98.6 F (37 C)] 98.6 F (37 C) (10/24 0500) Pulse Rate:  [66-88] 66 (10/24 0500) Resp:  [18] 18 (10/24 0500) BP: (147-164)/(44-60) 147/44 (10/24 0500) SpO2:  [97 %-99 %] 97 % (10/24 0500) Last BM Date: 09/04/17  Intake/Output from previous day: 10/23 0701 - 10/24 0700 In: 3401.7 [P.O.:1900; I.V.:1176.7; IV Piggyback:50] Out: 1900 [Urine:1900] Intake/Output this shift: Total I/O In: 50 [P.O.:50] Out: -   PE: Gen:  Alert, NAD, pleasant Card:  Regular rate and rhythm Pulm:  Normal effort, clear to auscultation bilaterally Abd: Soft, non-tender, non-distended, bowel sounds present in all 4 quadrants, no HSM, incisions C/D/I Skin: warm and dry, no rashes  Psych: A&Ox3   Lab Results:   Recent Labs  09/09/17 0542  WBC 6.8  HGB 9.7*  HCT 27.9*  PLT 292   BMET  Recent Labs  09/09/17 0542 09/11/17 0516  NA 131* 124*  K 4.3 4.1  CL 104 97*  CO2 24 21*  GLUCOSE 120* 92  BUN 13 11  CREATININE 0.93 0.88  CALCIUM 8.0* 7.8*   PT/INR No results for input(s): LABPROT, INR in the last 72 hours. CMP     Component Value Date/Time   NA 124 (L) 09/11/2017 0516   NA 136 08/20/2017 1150   NA 131 (L) 07/30/2016 1039   K 4.1 09/11/2017 0516   K 4.5 08/20/2017 1150   K 4.4 07/30/2016 1039   CL 97 (L) 09/11/2017 0516   CL 104 08/20/2017 1150   CO2 21 (L) 09/11/2017 0516   CO2 27 08/20/2017 1150   CO2 21 (L) 07/30/2016 1039   GLUCOSE 92 09/11/2017 0516   GLUCOSE 97 08/20/2017 1150   BUN 11 09/11/2017 0516   BUN 20 08/20/2017 1150   BUN 20.3 07/30/2016 1039   CREATININE 0.88 09/11/2017 0516   CREATININE 1.1 08/20/2017 1150   CREATININE 0.9  07/30/2016 1039   CALCIUM 7.8 (L) 09/11/2017 0516   CALCIUM 9.0 08/20/2017 1150   CALCIUM 8.7 07/30/2016 1039   PROT 4.6 (L) 09/11/2017 0516   PROT 6.1 (L) 08/20/2017 1150   PROT 5.8 (L) 07/30/2016 1039   ALBUMIN 2.4 (L) 09/11/2017 0516   ALBUMIN 3.8 08/20/2017 1150   ALBUMIN 3.8 03/04/2017 1516   ALBUMIN 3.6 07/30/2016 1039   AST 42 (H) 09/11/2017 0516   AST 28 08/20/2017 1150   AST 17 07/30/2016 1039   ALT 62 09/11/2017 0516   ALT 16 08/20/2017 1150   ALT 18 07/30/2016 1039   ALKPHOS 93 09/11/2017 0516   ALKPHOS 66 08/20/2017 1150   ALKPHOS 47 07/30/2016 1039   BILITOT 1.8 (H) 09/11/2017 0516   BILITOT 1.10 08/20/2017 1150   BILITOT 0.93 07/30/2016 1039   GFRNONAA >60 09/11/2017 0516   GFRAA >60 09/11/2017 0516   Lipase     Component Value Date/Time   LIPASE 56 (H) 09/07/2017 0534       Studies/Results: No results found.  Anti-infectives: Anti-infectives    Start     Dose/Rate Route Frequency Ordered Stop   09/11/17 1800  amoxicillin-clavulanate (AUGMENTIN) 875-125 MG per tablet 1 tablet     1 tablet Oral Every 12 hours  09/11/17 1019     09/10/17 2200  amoxicillin-clavulanate (AUGMENTIN) 500-125 MG per tablet 500 mg  Status:  Discontinued     1 tablet Oral Every 12 hours 09/10/17 1844 09/11/17 1019   09/05/17 0200  piperacillin-tazobactam (ZOSYN) IVPB 3.375 g  Status:  Discontinued     3.375 g 12.5 mL/hr over 240 Minutes Intravenous Every 8 hours 09/04/17 2158 09/10/17 1844   09/04/17 2000  piperacillin-tazobactam (ZOSYN) IVPB 3.375 g     3.375 g 100 mL/hr over 30 Minutes Intravenous  Once 09/04/17 1956 09/04/17 2114       Assessment/Plan Dementia HTN AAA HLD CAD GERD BPH  Choledocholithiasis with Acute cholecystitis S/P lap chole +IOC , repair of cholecystoduodenal fistula, upper endoscopy - 09/07/17 Dr. Lucia Gaskins S/P ERCP 09/06/17 Dr Ardis Hughs - POD#4 - tolerating diet - mobilize with assistance - can send patient home on 5 days PO  augmentin Hyponatremia - Na 124, will order salt tabs; further management per primary team  FEN:  HH/CM; added colace for bowel regimen VTE: SCDs, lovenox ID: Zosyn (10/17>10/23), PO augmentin (10/24>>), WBC 6.8, afebrile Follow up: [facility] 11/6  Plan:  Mobilize patient. PO antibiotics x5 days Patient is stable for discharge from a surgical standpoint, will need to f/u with [facility].   LOS: 7 days    Brigid Re , [facility] 09/11/2017, 11:25 AM Pager: [phone] Consults: [phone] Mon-Fri 7:00 am-4:30 pm Sat-Sun 7:00 am-11:30 am

## 2017-09-12 DIAGNOSIS — R338 Other retention of urine: Secondary | ICD-10-CM

## 2017-09-12 LAB — GLUCOSE, CAPILLARY: GLUCOSE-CAPILLARY: 72 mg/dL (ref 65–99)

## 2017-09-12 LAB — BASIC METABOLIC PANEL
Anion gap: 5 (ref 5–15)
BUN: 12 mg/dL (ref 6–20)
CHLORIDE: 104 mmol/L (ref 101–111)
CO2: 21 mmol/L — AB (ref 22–32)
Calcium: 8.2 mg/dL — ABNORMAL LOW (ref 8.9–10.3)
Creatinine, Ser: 0.83 mg/dL (ref 0.61–1.24)
GFR calc Af Amer: >60 mL/min (ref >60)
GFR calc non Af Amer: >60 mL/min (ref >60)
GLUCOSE: 85 mg/dL (ref 65–99)
POTASSIUM: 4.2 mmol/L (ref 3.5–5.1)
Sodium: 130 mmol/L — ABNORMAL LOW (ref 135–145)

## 2017-09-12 MED ORDER — SIMVASTATIN 40 MG PO TABS: ORAL_TABLET | ORAL | 0 refills | Status: DC

## 2017-09-12 MED ORDER — AMLODIPINE BESYLATE 5 MG PO TABS: 5.0000 mg | ORAL_TABLET | Freq: Every day | ORAL | 0 refills | Status: DC

## 2017-09-12 MED ORDER — DOCUSATE SODIUM 100 MG PO CAPS: 100.0000 mg | ORAL_CAPSULE | Freq: Two times a day (BID) | ORAL | 0 refills | Status: AC

## 2017-09-12 MED ORDER — AMOXICILLIN-POT CLAVULANATE 875-125 MG PO TABS: 1.0000 | ORAL_TABLET | Freq: Two times a day (BID) | ORAL | 0 refills | Status: AC

## 2017-09-12 NOTE — Progress Notes (Signed)
Patient ready for discharge, daughter notified and stated will not be able to get here until after 1pm to review his discharge papers and then we can arrange for EMS transportation to home after that.

## 2017-09-12 NOTE — Progress Notes (Signed)
[facility] Progress Note  5 Days Post-Op  Subjective: CC: no complaints Patient resting comfortably in bed, no family present at bedside. Patient tells me he is eating ok. Unsure of whether he has had a BM, none recorded. Denies abdominal pain. UOP good. VSS.  Objective: Vital signs in last 24 hours: Temp:  [97.3 F (36.3 C)-98.8 F (37.1 C)] 98.8 F (37.1 C) (10/25 0450) Pulse Rate:  [65-69] 65 (10/25 0450) Resp:  [18] 18 (10/25 0450) BP: (135-165)/(40-66) 137/40 (10/25 0933) SpO2:  [97 %-98 %] 97 % (10/25 0450) Last BM Date:  (pt cant remember)  Intake/Output from previous day: 10/24 0701 - 10/25 0700 In: 290 [P.O.:290] Out: 3225 [Urine:3225] Intake/Output this shift: No intake/output data recorded.  PE: Gen: Alert, NAD, pleasant Card: Regular rate and rhythm Pulm: Normal effort, clear to auscultation bilaterally Abd: Soft, non-tender, non-distended, bowel sounds present in all 4 quadrants, no HSM, incisions C/D/I Skin: warm and dry, no rashes  Psych: A&Ox3   Lab Results:  No results for input(s): WBC, HGB, HCT, PLT in the last 72 hours. BMET  Recent Labs  09/11/17 0516 09/11/17 1222 09/12/17 0456  NA 124* 128* 130*  K 4.1  --  4.2  CL 97*  --  104  CO2 21*  --  21*  GLUCOSE 92  --  85  BUN 11  --  12  CREATININE 0.88  --  0.83  CALCIUM 7.8*  --  8.2*   PT/INR No results for input(s): LABPROT, INR in the last 72 hours. CMP     Component Value Date/Time   NA 130 (L) 09/12/2017 0456   NA 136 08/20/2017 1150   NA 131 (L) 07/30/2016 1039   K 4.2 09/12/2017 0456   K 4.5 08/20/2017 1150   K 4.4 07/30/2016 1039   CL 104 09/12/2017 0456   CL 104 08/20/2017 1150   CO2 21 (L) 09/12/2017 0456   CO2 27 08/20/2017 1150   CO2 21 (L) 07/30/2016 1039   GLUCOSE 85 09/12/2017 0456   GLUCOSE 97 08/20/2017 1150   BUN 12 09/12/2017 0456   BUN 20 08/20/2017 1150   BUN 20.3 07/30/2016 1039   CREATININE 0.83 09/12/2017 0456   CREATININE 1.1  08/20/2017 1150   CREATININE 0.9 07/30/2016 1039   CALCIUM 8.2 (L) 09/12/2017 0456   CALCIUM 9.0 08/20/2017 1150   CALCIUM 8.7 07/30/2016 1039   PROT 4.6 (L) 09/11/2017 0516   PROT 6.1 (L) 08/20/2017 1150   PROT 5.8 (L) 07/30/2016 1039   ALBUMIN 2.4 (L) 09/11/2017 0516   ALBUMIN 3.8 08/20/2017 1150   ALBUMIN 3.8 03/04/2017 1516   ALBUMIN 3.6 07/30/2016 1039   AST 42 (H) 09/11/2017 0516   AST 28 08/20/2017 1150   AST 17 07/30/2016 1039   ALT 62 09/11/2017 0516   ALT 16 08/20/2017 1150   ALT 18 07/30/2016 1039   ALKPHOS 93 09/11/2017 0516   ALKPHOS 66 08/20/2017 1150   ALKPHOS 47 07/30/2016 1039   BILITOT 1.8 (H) 09/11/2017 0516   BILITOT 1.10 08/20/2017 1150   BILITOT 0.93 07/30/2016 1039   GFRNONAA >60 09/12/2017 0456   GFRAA >60 09/12/2017 0456   Lipase     Component Value Date/Time   LIPASE 56 (H) 09/07/2017 0534       Studies/Results: No results found.  Anti-infectives: Anti-infectives    Start     Dose/Rate Route Frequency Ordered Stop   09/12/17 0000  amoxicillin-clavulanate (AUGMENTIN) 875-125 MG tablet  1 tablet Oral Every 12 hours 09/12/17 0814 09/17/17 2359   09/11/17 1800  amoxicillin-clavulanate (AUGMENTIN) 875-125 MG per tablet 1 tablet     1 tablet Oral Every 12 hours 09/11/17 1019     09/10/17 2200  amoxicillin-clavulanate (AUGMENTIN) 500-125 MG per tablet 500 mg  Status:  Discontinued     1 tablet Oral Every 12 hours 09/10/17 1844 09/11/17 1019   09/05/17 0200  piperacillin-tazobactam (ZOSYN) IVPB 3.375 g  Status:  Discontinued     3.375 g 12.5 mL/hr over 240 Minutes Intravenous Every 8 hours 09/04/17 2158 09/10/17 1844   09/04/17 2000  piperacillin-tazobactam (ZOSYN) IVPB 3.375 g     3.375 g 100 mL/hr over 30 Minutes Intravenous  Once 09/04/17 1956 09/04/17 2114       Assessment/Plan  Dementia HTN AAA HLD CAD GERD BPH  Choledocholithiasis with Acute cholecystitis S/P lap chole +IOC , repair of cholecystoduodenal fistula, upper  endoscopy - 09/07/17 Dr. Lucia Gaskins S/P ERCP 09/06/17 Dr Ardis Hughs - POD#5 - tolerating diet - mobilize with assistance - can send patient home on 5 days PO augmentin Hyponatremia - 130 today, which appears more in line with baseline   FEN:  HH/CM; added colace for bowel regimen VTE: SCDs, lovenox ID: Zosyn (10/17>10/23), PO augmentin (10/24>>), WBC 6.8, afebrile Follow up: [facility] clinic 11/6  Plan:  Mobilize patient. PO antibiotics x5 days Patient is stable for discharge from a surgical standpoint, will need to f/u with [facility].    LOS: 8 days    Brigid Re , [facility] 09/12/2017, 10:27 AM Pager: [phone] Consults: [phone] Mon-Fri 7:00 am-4:30 pm Sat-Sun 7:00 am-11:30 am

## 2017-09-12 NOTE — Progress Notes (Signed)
Patient discharged home via ambulance, discharge instructions given and explained to patient/daughter and they verbalized understanding, patient denies any pain/distress, MASD on buttocks due to incontinence of bowel/bladder, cleaned and allyven applied, daughter aware and instruction given to daughter on assessment and home management as well. Daughter at the bedside during transportation.

## 2017-09-13 ENCOUNTER — Other Ambulatory Visit: Payer: Self-pay

## 2017-09-13 ENCOUNTER — Encounter: Payer: Self-pay | Admitting: Family

## 2017-09-13 ENCOUNTER — Telehealth: Payer: Self-pay | Admitting: *Deleted

## 2017-09-13 DIAGNOSIS — I2510 Atherosclerotic heart disease of native coronary artery without angina pectoris: Secondary | ICD-10-CM | POA: Diagnosis not present

## 2017-09-13 DIAGNOSIS — F0280 Dementia in other diseases classified elsewhere without behavioral disturbance: Secondary | ICD-10-CM | POA: Diagnosis not present

## 2017-09-13 DIAGNOSIS — D510 Vitamin B12 deficiency anemia due to intrinsic factor deficiency: Secondary | ICD-10-CM | POA: Diagnosis not present

## 2017-09-13 DIAGNOSIS — M48061 Spinal stenosis, lumbar region without neurogenic claudication: Secondary | ICD-10-CM | POA: Diagnosis not present

## 2017-09-13 DIAGNOSIS — G3183 Dementia with Lewy bodies: Secondary | ICD-10-CM | POA: Diagnosis not present

## 2017-09-13 DIAGNOSIS — D469 Myelodysplastic syndrome, unspecified: Secondary | ICD-10-CM | POA: Diagnosis not present

## 2017-09-13 MED ORDER — EPOETIN ALFA 40000 UNIT/ML IJ SOLN: 40000.0000 [IU] | INTRAMUSCULAR | 3 refills | Status: DC

## 2017-09-13 NOTE — Telephone Encounter (Signed)
Per chart review: Choledocholithiasis with Acute cholecystitis S/P lap chole +IOC , repair of cholecystoduodenal fistula, upper endoscopy - 09/07/17 Dr. Lucia Gaskins S/P ERCP 09/06/17 Dr Ardis Hughs - POD#5 - tolerating diet - mobilize with assistance - can send patient home on 5 days PO augmentin Hyponatremia- 130 today, which appears more in line with baseline   FEN: HH/CM; added colace for bowel regimen VTE: SCDs, lovenox ID: Zosyn (10/17>10/23), PO augmentin (10/24>>), WBC 6.8, afebrile Follow up: [facility] 11/6  Plan: Mobilize patient. PO antibiotics x5 daysPatient is stable for discharge from a surgical standpoint, will need to f/u with [facility].    LOS: 8 days  _______________________________________________________________________________ Left VM requesting return call.

## 2017-09-13 NOTE — Telephone Encounter (Signed)
As discussed at previous office visit, daughter called with more information related to arranging for home health to draw labs and administer Procrit.   [facility] has been established for these services, although the drug needs to be prescribed.  Script to [employer] as they report they are able to order this med. Patient's copay, however, is $3847.00.  [place] emailed re: possible copay assistance.   Daughter aware this process has been initiated & verbalizes understanding. dph

## 2017-09-13 NOTE — Progress Notes (Signed)
Received forwarded email regarding possible assistance for Procrit injections.  Searched on [employer] and [employer] supports the drug and patient may be able to apply through them.  Attempted to reach patient's daughter at number listed in Epic and number was listed incorrectly.  Called [place]([phone]) to obtain daughter's number.   Called daughter at number given and left voicemail with contact information for [employer] Patient Assistance Program as well as my contact information for any additional financial questions or concerns.

## 2017-09-15 NOTE — Discharge Summary (Signed)
Physician Discharge Summary  Alexander Rice LEX:[id number] DOB: [birth year] DOA: 09/04/2017  PCP: Marin Olp, MD  Admit date: 09/04/2017 Discharge date: 09/12/2017  Admitted From: Home.  Disposition:  Home   Recommendations for Outpatient Follow-up:  1. Follow up with PCP in 1-2 weeks 2. Please obtain BMP/CBC in one week 3. Please follow up  With surgery as needed.   Home Health:Yes Discharge Condition:guarded.  CODE STATUS:  Full code.  Diet recommendation: Heart Healthy   Brief/Interim Summary: 83 y.o.malewith medical history significant of MDS (follows with Dr. Marin Olp), hypertension, hyperlipidemia, stroke,CAD s/p of stent placement, BPH, anemia, Parkinson's disease, CKD-3, who presents with low hemoglobin, generalized weakness and fever.workup significant for profound anemia with hemoglobin of 5.2, and choledocholithiasis.transfused 2 units PRBC with good response, went for ERCP 09/06/2017 significant for with large stone burden, cleared with biliary sphincterotomy and balloon extraction.  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  Surgery and GI on board and appreciate recommendations.  lfts improving.    Symptomatic anemia:  Probably from anemia of chronic disease from MDS.  Received 3 units of prbc transfusion during the hospitalization.   follow up with Dr Marin Olp as outpatient.  Stable H&H.    Hypertension; well controlled.    Hyperlipidemia:  Holding statins for now.    aki on stage 3 CKD: Mild improvement in creatinine.   Hyponatremia:  Appears to be chronic. Baseline around 130 and stable.    Discharge Instructions  Discharge Instructions    Diet - low sodium heart healthy    Complete by:  As directed    Discharge instructions    Complete by:  As directed    PLEASE follow up with general surgery as recommended. Please follow up with PCP in one week.     Allergies as of 09/12/2017   No Known Allergies     Medication List    TAKE these medications   amLODipine 5 MG tablet Commonly known as:  NORVASC Take 1 tablet (5 mg total) by mouth daily.   amoxicillin-clavulanate 875-125 MG tablet Commonly known as:  AUGMENTIN Take 1 tablet by mouth every 12 (twelve) hours.   aspirin 81 MG tablet Take 81 mg by mouth daily.   carbidopa-levodopa 25-100 MG tablet Commonly known as:  SINEMET IR Take 1/2 tablet three times a day What changed:  how much to take  how to take this  when to take this  additional instructions   [employer] OMEGA-3 KRILL OIL PO Take 350 mg by mouth daily. Reported on 01/16/2016   [employer] PROBIOTIC Chew Chew 1 tablet by mouth daily.   cyanocobalamin 100 MCG tablet Take 100 mcg by mouth daily.   docusate sodium 100 MG capsule Commonly known as:  COLACE Take 1 capsule (100 mg total) by mouth 2 (two) times daily.   dorzolamide 2 % ophthalmic solution Commonly known as:  TRUSOPT Place 1 drop into both eyes 2 (two) times daily.   doxazosin  4 MG tablet Commonly known as:  CARDURA TAKE 1 TABLET BY MOUTH AT  BEDTIME   lisinopril 5 MG tablet Commonly known as:  PRINIVIL,ZESTRIL TAKE 1 TABLET BY MOUTH  DAILY   LUMIGAN 0.01 % Soln Generic drug:  bimatoprost Place 1 drop into both eyes at bedtime.   memantine 10 MG tablet Commonly known as:  NAMENDA TAKE 1 TABLET BY  MOUTH TWO  TIMES DAILY   multivitamin tablet Take 1 tablet by mouth daily.   omeprazole 20 MG capsule Commonly known as:  PRILOSEC TAKE 1 CAPSULE BY MOUTH  DAILY   simvastatin 40 MG tablet Commonly known as:  ZOCOR Hold for 2 to 4 weeks and recheck liver function tests at that time and if stable, can be restarted as per discretion of the PCP. What changed:  See the new instructions.   vitamin B-6 250 MG tablet Take 1 tablet (250 mg total) by mouth daily.      Follow-up [facility], [place]. Go on 09/24/2017.   Specialty:  General Surgery Why:  Your appointment is at 10:45 am. Please arrive by 10:15 am. Bring photo ID and insurance information.  Contact information: [address] [place], [facility] Follow up.   Specialty:  Home Health Services Why:  [facility] nursing,physical therapy,occupational therapy,aide,social worker Contact information: [place] [address] [phone]        Marin Olp, MD. Schedule an appointment as soon as possible for a visit in 1 week(s).   Specialty:  Family Medicine Contact information: [place] [address] [phone]          No Known Allergies  Consultations:  Surgery   Gi.    Procedures/Studies: Dg Chest 2 View  Result Date: 09/04/2017 CLINICAL DATA:  Cough and fevers with increased confusion EXAM: CHEST  2 VIEW COMPARISON:  05/29/2016 FINDINGS: Cardiac shadow remains enlarged. The thoracic aorta is tortuous. Calcifications are noted. Elevation of left hemidiaphragm is again seen. Mild interstitial changes are noted. Mild bibasilar atelectatic changes are noted. No focal infiltrate is noted. IMPRESSION: Mild bibasilar atelectatic changes. Aortic Atherosclerosis (ICD10-170.0) Electronically Signed   By: Inez Catalina M.D.   On: 09/04/2017 15:22   Dg Cholangiogram  Operative  Result Date: 09/07/2017 CLINICAL DATA:  Cholecystectomy, intraoperative imaging EXAM: INTRAOPERATIVE CHOLANGIOGRAM TECHNIQUE: Cholangiographic images from the C-arm fluoroscopic device were submitted for interpretation post-operatively. Please see the procedural report for the amount of contrast and the fluoroscopy time utilized. COMPARISON:  09/06/2017 FINDINGS: Intraoperative cholangiogram performed during the laparoscopic procedure. Injection of the cystic duct demonstrates diffuse biliary dilatation of the intrahepatic ducts, biliary confluence, common hepatic duct, and common bile duct. Irregular filling defect in the distal CBD may represent residual choledocholithiasis versus sludge or blood clot. Contrast does not freely drain into the duodenum. IMPRESSION: Distal CBD irregular ill-defined filling defect as above. Associated biliary dilatation and obstruction. Contrast does not freely flow into the duodenum. Electronically Signed   By: Jerilynn Mages.  Shick M.D.   On: 09/07/2017 09:50   Ct Abdomen Pelvis W Contrast  Result Date: 09/04/2017 CLINICAL DATA:  Abnormal LFT.  Jaundice EXAM: CT ABDOMEN AND PELVIS WITH CONTRAST TECHNIQUE: Multidetector CT imaging of the abdomen and pelvis was performed using the standard protocol following bolus administration of intravenous contrast. CONTRAST:  <See Chart> ISOVUE-300 IOPAMIDOL (ISOVUE-300) INJECTION 61% COMPARISON:  CT abdomen pelvis 04/01/2012 FINDINGS:  Lower chest: Mild left lower lobe atelectasis. Extensive coronary calcification. Small left lower lobe lung nodule on the prior study difficult to see on today's study due to atelectasis Hepatobiliary: Biliary dilatation. Mild intrahepatic and extrahepatic biliary dilatation. Soft tissue density within the common bile duct distally may represent common bile duct stones. Gallbladder is contracted. There is gas in the fundus of the gallbladder. Gallbladder wall not significantly thickened. No focal liver mass.  Pancreas: Negative Spleen: Multiple calcified granulomata.  Normal splenic size Adrenals/Urinary Tract: Multiple left renal cyst. No renal mass or obstruction. Urinary bladder normal Stomach/Bowel: Negative for bowel obstruction.  Negative for mass Vascular/Lymphatic: Extensive atherosclerotic calcification. Abdominal aortic aneurysm measuring up to 35 mm, measuring 32 mm previously. Reproductive: Mild prostate enlargement Other: 10 x 20 mm calcification medially inferior to the right lobe of the liver. This was not present previously and appears separate from bowel. Possible fat necrosis. No surrounding inflammation to suggest acute process. Musculoskeletal: Scoliosis and extensive lumbar degenerative change. X stop device at L4-5. No acute skeletal abnormality. IMPRESSION: Left lower lobe atelectasis.  Extensive coronary calcification Biliary dilatation. Soft tissue density within the common bile duct may represent gallstones or possibly tumor. In addition, there is gas within the gallbladder fundus. This may be related to recently passed gallstones or emphysematous cholecystitis. However, the gallbladder does not appear to be acutely inflamed or thickened at this time. 10 x 20 mm calcification inferior to the right lobe liver appears nonacute without surrounding edema however this was not present on the prior study Atherosclerotic disease with abdominal aortic aneurysm 35 mm Recommend followup by ultrasound in 2 years. This recommendation follows ACR consensus guidelines: White Paper of the ACR Incidental Findings Committee II on Vascular Findings. J Am Coll Radiol 2013; 10:789-794. Electronically Signed   By: Franchot Gallo M.D.   On: 09/04/2017 19:16   Mr Abdomen Mrcp Wo Contrast  Result Date: 09/04/2017 CLINICAL DATA:  Elevated liver function studies. Gas in the gallbladder on CT. Common duct stones seen on CT. Patient has dementia and unable to hold breath. EXAM: MRI ABDOMEN WITHOUT CONTRAST  (INCLUDING  MRCP) TECHNIQUE: Multiplanar multisequence MR imaging of the abdomen was performed. Heavily T2-weighted images of the biliary and pancreatic ducts were obtained, and three-dimensional MRCP images were rendered by post processing. COMPARISON:  CT abdomen and pelvis 09/04/2017 FINDINGS: Examination is technically limited due to motion artifact. Lower chest: There appear to be minimal bilateral pleural effusions. Hepatobiliary: No focal liver lesions are demonstrated. There is diffuse low signal intensity throughout the liver particularly on T2 weighted imaging. This suggests iron deposition. Apparent signal loss in the liver on out of phase imaging suggesting diffuse fatty infiltration as well. Diffuse intra and extrahepatic bile duct dilatation. Multiple filling defects seen in the mid and distal common bile duct consistent with choledocholithiasis. Gallbladder is partially contracted. Limited visualization of the gallbladder due to technique but no apparent wall thickening. There is a filling defect within the gallbladder likely representing a gallstone. The intraluminal gas seen at CT is not appreciated on MRI. Pancreas: No focal pancreatic lesion or ductal dilatation identified. Spleen: Spleen size is normal. The spleen has a diffusely heterogeneous appearance with tiny nodular pattern demonstrated particularly on T2 weighted imaging. This may reflect changes due to iron overload or may indicate an infectious process. No specific focal lesions are identified. Adrenals/Urinary Tract: No adrenal gland nodules. Bilateral renal cysts. No hydronephrosis or hydroureter. Stomach/Bowel: Visualized portions within the abdomen are unremarkable. Vascular/Lymphatic: Infrarenal aortic ectasia  with maximal AP diameter of about 3.4 cm. No significant lymphadenopathy. Other: No free intra-abdominal fluid is demonstrated. Mild edema in the dependent soft tissues. Musculoskeletal: Degenerative changes and scoliosis of the lumbar  spine. Postoperative changes in the lower lumbar spine. Multiple areas of central canal stenosis are suggested, most severe at the L5-S1 level. IMPRESSION: 1. Cholelithiasis and choledocholithiasis with associated bile duct dilatation. 2. Diffuse low signal intensity throughout the liver consistent with iron deposition. Probable diffuse fatty infiltration as well. 3. Heterogeneous appearance of the spleen may be related iron deposition or could indicate infectious process. No focal lesions. 4. Bilateral renal cysts. 5. Dilated abdominal aorta with maximal AP diameter 3.4 cm. Recommend followup by ultrasound in 3 years. This recommendation follows ACR consensus guidelines: White Paper of the ACR Incidental Findings Committee II on Vascular Findings. J Am Coll Radiol 2013; 00:938-182 6. Degenerative changes throughout the lumbar spine with areas of central canal stenosis, most prominent at the L5-S1 level. Electronically Signed   By: Lucienne Capers M.D.   On: 09/04/2017 23:06   Mr 3d Recon At Scanner  Result Date: 09/04/2017 CLINICAL DATA:  Elevated liver function studies. Gas in the gallbladder on CT. Common duct stones seen on CT. Patient has dementia and unable to hold breath. EXAM: MRI ABDOMEN WITHOUT CONTRAST  (INCLUDING MRCP) TECHNIQUE: Multiplanar multisequence MR imaging of the abdomen was performed. Heavily T2-weighted images of the biliary and pancreatic ducts were obtained, and three-dimensional MRCP images were rendered by post processing. COMPARISON:  CT abdomen and pelvis 09/04/2017 FINDINGS: Examination is technically limited due to motion artifact. Lower chest: There appear to be minimal bilateral pleural effusions. Hepatobiliary: No focal liver lesions are demonstrated. There is diffuse low signal intensity throughout the liver particularly on T2 weighted imaging. This suggests iron deposition. Apparent signal loss in the liver on out of phase imaging suggesting diffuse fatty infiltration as  well. Diffuse intra and extrahepatic bile duct dilatation. Multiple filling defects seen in the mid and distal common bile duct consistent with choledocholithiasis. Gallbladder is partially contracted. Limited visualization of the gallbladder due to technique but no apparent wall thickening. There is a filling defect within the gallbladder likely representing a gallstone. The intraluminal gas seen at CT is not appreciated on MRI. Pancreas: No focal pancreatic lesion or ductal dilatation identified. Spleen: Spleen size is normal. The spleen has a diffusely heterogeneous appearance with tiny nodular pattern demonstrated particularly on T2 weighted imaging. This may reflect changes due to iron overload or may indicate an infectious process. No specific focal lesions are identified. Adrenals/Urinary Tract: No adrenal gland nodules. Bilateral renal cysts. No hydronephrosis or hydroureter. Stomach/Bowel: Visualized portions within the abdomen are unremarkable. Vascular/Lymphatic: Infrarenal aortic ectasia with maximal AP diameter of about 3.4 cm. No significant lymphadenopathy. Other: No free intra-abdominal fluid is demonstrated. Mild edema in the dependent soft tissues. Musculoskeletal: Degenerative changes and scoliosis of the lumbar spine. Postoperative changes in the lower lumbar spine. Multiple areas of central canal stenosis are suggested, most severe at the L5-S1 level. IMPRESSION: 1. Cholelithiasis and choledocholithiasis with associated bile duct dilatation. 2. Diffuse low signal intensity throughout the liver consistent with iron deposition. Probable diffuse fatty infiltration as well. 3. Heterogeneous appearance of the spleen may be related iron deposition or could indicate infectious process. No focal lesions. 4. Bilateral renal cysts. 5. Dilated abdominal aorta with maximal AP diameter 3.4 cm. Recommend followup by ultrasound in 3 years. This recommendation follows ACR consensus guidelines: White Paper of  the ACR Incidental  Findings Committee II on Vascular Findings. J Am Coll Radiol 2013; 13:244-010 6. Degenerative changes throughout the lumbar spine with areas of central canal stenosis, most prominent at the L5-S1 level. Electronically Signed   By: Lucienne Capers M.D.   On: 09/04/2017 23:06   Dg Ercp  Result Date: 09/06/2017 CLINICAL DATA:  ERCP with sphincterotomy, balloon occlusion cholangiogram and CBD stones/sludge removal. EXAM: ERCP TECHNIQUE: Multiple spot images obtained with the fluoroscopic device and submitted for interpretation post-procedure. COMPARISON:  MRCP - 09/04/2017; CT abdomen pelvis -09/04/2017 FLUOROSCOPY TIME:  7 minutes, 8 seconds FINDINGS: Ten spot intraoperative fluoroscopic images of the right upper abdominal quadrant during ERCP are provided for review Initial image demonstrates an ERCP probe overlying the right upper abdominal quadrant. There is a suspected cannulation of the pancreatic duct. Subsequent images demonstrate selective cannulation and opacification of the common bile duct with several persistent filling defects seen within the CBD suggestive of choledocholithiasis demonstrated on preceding MRCP. Subsequent images demonstrate insufflation of a balloon within the central / mid aspect of the CBD with subsequent presumed sweeping and sphincterotomy. The common bile duct appears mildly dilated. There is minimal opacification of the central aspect of the intrahepatic biliary tree which appears very mildly dilated. There is no definitive opacification of the cystic or pancreatic ducts. IMPRESSION: ERCP with findings of choledocholithiasis with subsequent balloon sweeping and presumed sphincterotomy as detailed above. These images were submitted for radiologic interpretation only. Please see the procedural report for the amount of contrast and the fluoroscopy time utilized. Electronically Signed   By: Sandi Mariscal M.D.   On: 09/06/2017 13:37       Subjective:  No new  complaints.  Discharge Exam: Vitals:   09/12/17 0933 09/12/17 1218  BP: (!) 137/40 (!) 140/46  Pulse:  74  Resp:    Temp:  98.3 F (36.8 C)  SpO2:  98%   Vitals:   09/11/17 1949 09/12/17 0450 09/12/17 0933 09/12/17 1218  BP: (!) 135/46 (!) 153/54 (!) 137/40 (!) 140/46  Pulse: 66 65  74  Resp: 18 18    Temp: (!) 97.3 F (36.3 C) 98.8 F (37.1 C)  98.3 F (36.8 C)  TempSrc: Oral Oral  Oral  SpO2: 98% 97%  98%  Weight:      Height:        General: Pt is alert, awake, not in acute distress Cardiovascular: RRR, S1/S2 +, no rubs, no gallops Respiratory: CTA bilaterally, no wheezing, no rhonchi Abdominal: Soft, NT, ND, bowel sounds + Extremities: no edema, no cyanosis    The results of significant diagnostics from this hospitalization (including imaging, microbiology, ancillary and laboratory) are listed below for reference.     Microbiology: Recent Results (from the past 240 hour(s))  Surgical pcr screen     Status: Abnormal   Collection Time: 09/06/17  9:18 PM  Result Value Ref Range Status   MRSA, PCR NEGATIVE NEGATIVE Final   Staphylococcus aureus POSITIVE (A) NEGATIVE Final    Comment: (NOTE) The Xpert SA Assay (FDA approved for NASAL specimens in patients 14 years of age and older), is one component of a comprehensive surveillance program. It is not intended to diagnose infection nor to guide or monitor treatment.      Labs: BNP (last 3 results) No results for input(s): BNP in the last 8760 hours. Basic Metabolic Panel:  Recent Labs Lab 09/09/17 0542 09/11/17 0516 09/11/17 1222 09/12/17 0456  NA 131* 124* 128* 130*  K 4.3 4.1  --  4.2  CL 104 97*  --  104  CO2 24 21*  --  21*  GLUCOSE 120* 92  --  85  BUN 13 11  --  12  CREATININE 0.93 0.88  --  0.83  CALCIUM 8.0* 7.8*  --  8.2*   Liver Function Tests:  Recent Labs Lab 09/09/17 0542 09/11/17 0516  AST 86* 42*  ALT 198* 62  ALKPHOS 116 93  BILITOT 2.2* 1.8*  PROT 4.9* 4.6*  ALBUMIN 2.5*  2.4*   No results for input(s): LIPASE, AMYLASE in the last 168 hours. No results for input(s): AMMONIA in the last 168 hours. CBC:  Recent Labs Lab 09/09/17 0542  WBC 6.8  NEUTROABS 4.3  HGB 9.7*  HCT 27.9*  MCV 94.9  PLT 292   Cardiac Enzymes: No results for input(s): CKTOTAL, CKMB, CKMBINDEX, TROPONINI in the last 168 hours. BNP: Invalid input(s): POCBNP CBG:  Recent Labs Lab 09/10/17 0737 09/11/17 0741 09/11/17 2023 09/12/17 0759  GLUCAP 133* 83 121* 72   D-Dimer No results for input(s): DDIMER in the last 72 hours. Hgb A1c No results for input(s): HGBA1C in the last 72 hours. Lipid Profile No results for input(s): CHOL, HDL, LDLCALC, TRIG, CHOLHDL, LDLDIRECT in the last 72 hours. Thyroid function studies No results for input(s): TSH, T4TOTAL, T3FREE, THYROIDAB in the last 72 hours.  Invalid input(s): FREET3 Anemia work up No results for input(s): VITAMINB12, FOLATE, FERRITIN, TIBC, IRON, RETICCTPCT in the last 72 hours. Urinalysis    Component Value Date/Time   COLORURINE AMBER (A) 09/04/2017 2003   APPEARANCEUR CLEAR 09/04/2017 2003   LABSPEC 1.020 09/04/2017 2003   PHURINE 5.0 09/04/2017 2003   GLUCOSEU NEGATIVE 09/04/2017 2003   HGBUR SMALL (A) 09/04/2017 2003   HGBUR negative 10/11/2009 0819   BILIRUBINUR SMALL (A) 09/04/2017 2003   BILIRUBINUR n 04/18/2016 [place] 09/04/2017 2003   PROTEINUR NEGATIVE 09/04/2017 2003   UROBILINOGEN 1.0 04/18/2016 1152   UROBILINOGEN 0.2 10/11/2009 0819   NITRITE NEGATIVE 09/04/2017 2003   LEUKOCYTESUR NEGATIVE 09/04/2017 2003   Sepsis Labs Invalid input(s): PROCALCITONIN,  WBC,  LACTICIDVEN Microbiology Recent Results (from the past 240 hour(s))  Surgical pcr screen     Status: Abnormal   Collection Time: 09/06/17  9:18 PM  Result Value Ref Range Status   MRSA, PCR NEGATIVE NEGATIVE Final   Staphylococcus aureus POSITIVE (A) NEGATIVE Final    Comment: (NOTE) The Xpert SA Assay (FDA  approved for NASAL specimens in patients 19 years of age and older), is one component of a comprehensive surveillance program. It is not intended to diagnose infection nor to guide or monitor treatment.      Time coordinating discharge: Over 30 minutes  SIGNED:   Hosie Poisson, MD  [facility] 09/15/2017, 9:19 AM Pager   If 7PM-7AM, please contact night-coverage [email or web address] Password [email or web address]

## 2017-09-17 DIAGNOSIS — F0280 Dementia in other diseases classified elsewhere without behavioral disturbance: Secondary | ICD-10-CM | POA: Diagnosis not present

## 2017-09-17 DIAGNOSIS — D469 Myelodysplastic syndrome, unspecified: Secondary | ICD-10-CM | POA: Diagnosis not present

## 2017-09-17 DIAGNOSIS — G3183 Dementia with Lewy bodies: Secondary | ICD-10-CM | POA: Diagnosis not present

## 2017-09-17 DIAGNOSIS — D510 Vitamin B12 deficiency anemia due to intrinsic factor deficiency: Secondary | ICD-10-CM | POA: Diagnosis not present

## 2017-09-17 DIAGNOSIS — M48061 Spinal stenosis, lumbar region without neurogenic claudication: Secondary | ICD-10-CM | POA: Diagnosis not present

## 2017-09-17 DIAGNOSIS — I2510 Atherosclerotic heart disease of native coronary artery without angina pectoris: Secondary | ICD-10-CM | POA: Diagnosis not present

## 2017-09-18 ENCOUNTER — Telehealth: Payer: Self-pay | Admitting: Family Medicine

## 2017-09-18 DIAGNOSIS — I2510 Atherosclerotic heart disease of native coronary artery without angina pectoris: Secondary | ICD-10-CM | POA: Diagnosis not present

## 2017-09-18 DIAGNOSIS — G3183 Dementia with Lewy bodies: Secondary | ICD-10-CM | POA: Diagnosis not present

## 2017-09-18 DIAGNOSIS — D510 Vitamin B12 deficiency anemia due to intrinsic factor deficiency: Secondary | ICD-10-CM | POA: Diagnosis not present

## 2017-09-18 DIAGNOSIS — D469 Myelodysplastic syndrome, unspecified: Secondary | ICD-10-CM | POA: Diagnosis not present

## 2017-09-18 DIAGNOSIS — F0280 Dementia in other diseases classified elsewhere without behavioral disturbance: Secondary | ICD-10-CM | POA: Diagnosis not present

## 2017-09-18 DIAGNOSIS — M48061 Spinal stenosis, lumbar region without neurogenic claudication: Secondary | ICD-10-CM | POA: Diagnosis not present

## 2017-09-18 NOTE — Telephone Encounter (Signed)
Pete with [facility] called in reference to PT eval for patient. Laurey Arrow stated he would like to continue PT with patient and needs verbal authorization. Please call and advise.

## 2017-09-19 ENCOUNTER — Encounter: Payer: Self-pay | Admitting: Family Medicine

## 2017-09-19 ENCOUNTER — Ambulatory Visit (INDEPENDENT_AMBULATORY_CARE_PROVIDER_SITE_OTHER): Payer: Medicare Other | Admitting: Family Medicine

## 2017-09-19 DIAGNOSIS — D469 Myelodysplastic syndrome, unspecified: Secondary | ICD-10-CM

## 2017-09-19 DIAGNOSIS — I10 Essential (primary) hypertension: Secondary | ICD-10-CM | POA: Diagnosis not present

## 2017-09-19 DIAGNOSIS — E785 Hyperlipidemia, unspecified: Secondary | ICD-10-CM | POA: Diagnosis not present

## 2017-09-19 DIAGNOSIS — N179 Acute kidney failure, unspecified: Secondary | ICD-10-CM

## 2017-09-19 DIAGNOSIS — I2510 Atherosclerotic heart disease of native coronary artery without angina pectoris: Secondary | ICD-10-CM | POA: Diagnosis not present

## 2017-09-19 DIAGNOSIS — I714 Abdominal aortic aneurysm, without rupture, unspecified: Secondary | ICD-10-CM

## 2017-09-19 DIAGNOSIS — K316 Fistula of stomach and duodenum: Secondary | ICD-10-CM | POA: Diagnosis not present

## 2017-09-19 DIAGNOSIS — E871 Hypo-osmolality and hyponatremia: Secondary | ICD-10-CM | POA: Diagnosis not present

## 2017-09-19 DIAGNOSIS — N183 Chronic kidney disease, stage 3 (moderate): Secondary | ICD-10-CM | POA: Diagnosis not present

## 2017-09-19 DIAGNOSIS — K8050 Calculus of bile duct without cholangitis or cholecystitis without obstruction: Secondary | ICD-10-CM

## 2017-09-19 LAB — CBC WITH DIFFERENTIAL/PLATELET
Basophils Absolute: 0.1 10*3/uL (ref 0.0–0.1)
Basophils Relative: 1.1 % (ref 0.0–3.0)
EOS ABS: 0.1 10*3/uL (ref 0.0–0.7)
EOS PCT: 2.1 % (ref 0.0–5.0)
HCT: 27.5 % — ABNORMAL LOW (ref 39.0–52.0)
HEMOGLOBIN: 9.5 g/dL — AB (ref 13.0–17.0)
Lymphocytes Relative: 23.3 % (ref 12.0–46.0)
Lymphs Abs: 1.3 10*3/uL (ref 0.7–4.0)
MCHC: 34.4 g/dL (ref 30.0–36.0)
MCV: 97.8 fl (ref 78.0–100.0)
MONO ABS: 0.6 10*3/uL (ref 0.1–1.0)
Monocytes Relative: 10.3 % (ref 3.0–12.0)
Neutro Abs: 3.6 10*3/uL (ref 1.4–7.7)
Neutrophils Relative %: 63.2 % (ref 43.0–77.0)
Platelets: 683 10*3/uL — ABNORMAL HIGH (ref 150.0–400.0)
RBC: 2.82 Mil/uL — AB (ref 4.22–5.81)
RDW: 18.6 % — ABNORMAL HIGH (ref 11.5–15.5)
WBC: 5.7 10*3/uL (ref 4.0–10.5)

## 2017-09-19 LAB — COMPREHENSIVE METABOLIC PANEL
ALBUMIN: 3.6 g/dL (ref 3.5–5.2)
ALT: 27 U/L (ref 0–53)
AST: 28 U/L (ref 0–37)
Alkaline Phosphatase: 97 U/L (ref 39–117)
BUN: 15 mg/dL (ref 6–23)
CO2: 22 mEq/L (ref 19–32)
CREATININE: 0.77 mg/dL (ref 0.40–1.50)
Calcium: 8.5 mg/dL (ref 8.4–10.5)
Chloride: 97 mEq/L (ref 96–112)
GFR: 102.55 mL/min (ref >60.00)
Glucose, Bld: 101 mg/dL — ABNORMAL HIGH (ref 70–99)
Potassium: 4.7 mEq/L (ref 3.5–5.1)
SODIUM: 124 meq/L — AB (ref 135–145)
TOTAL PROTEIN: 5.7 g/dL — AB (ref 6.0–8.3)
Total Bilirubin: 1.5 mg/dL — ABNORMAL HIGH (ref 0.2–1.2)

## 2017-09-19 NOTE — Assessment & Plan Note (Signed)
Compliant with aspirin. Holding statin for 1 more week. Discussed briefly with daughter withdrawing statin given comorbidities but with CAD history she prefers to maintain it

## 2017-09-19 NOTE — Assessment & Plan Note (Signed)
Was started on amlodipine 5mg  in hospital- home BPs have been very well controlled. BP low on diastolic today.  Still taking his cardura and lisinopril 5mg . We opted to cut amlodipine to 2.5mg  and follow up in 3 weeks by phone- if BPs look great at home will continue half dose- if remain <120/70 could stop amlodipine

## 2017-09-19 NOTE — Assessment & Plan Note (Signed)
Also had formation of duodenal fistula from choleduodenal fistula- now s/p cholecystectomy/duodenal resection

## 2017-09-19 NOTE — Assessment & Plan Note (Signed)
LFTs largely normalized- slightly high bilirubin- will have patient restart statin in 1 more week.

## 2017-09-19 NOTE — Assessment & Plan Note (Signed)
Baseline near 130. 124 today- suspect dehydration once getting ome- not drinking enough fluids and have encouraged to increase fluid intake. Repeat bmet with home health if able in 2 weeks.

## 2017-09-19 NOTE — Patient Instructions (Addendum)
We opted to cut amlodipine to 2.5mg  and follow up in 3 weeks by phone- if BPs look great at home will continue half dose- if remain <120/70 could stop amlodipine  If liver tests all normal- may restart simvastatin  Please stop by lab before you go

## 2017-09-19 NOTE — Telephone Encounter (Signed)
Yes thanks 

## 2017-09-19 NOTE — Assessment & Plan Note (Signed)
I will resolve this issue- did have worsening kidney function but does not appear to have CKD III at baseline

## 2017-09-19 NOTE — Assessment & Plan Note (Signed)
Now s/p resolution after surgical intervention. Has had drastic improvement in bilirubin from 10 to 1.5. LFTs otherwise largely normalized.

## 2017-09-19 NOTE — Progress Notes (Signed)
Subjective:  Alexander Rice is a 83 y.o. year old very pleasant male patient who presents for transitional care management and hospital follow up for severe anemia and choledocholithiasis. Patient was hospitalized from 09/04/17 to 09/12/17. A TCM phone call was completed on 09/13/17. Medical complexity high  Patient with history MDS, HTN, HLD, storke, CAD s/p stent, parkinsons, ckd 3 presented with fatigue, jaundice to office and found to have elevated bilirubin- sent ot ER and found to have hemoglobin 5.2 requiring 2 units and workup showed choledocholithiasis. Treated with IV antbiotics and ERCP (initially evaluated by [facility]CP)- noting large stool burden- sphincterotomy and balloon extraction used  Patient severely ill. Evaluated by [facility]CP and later treated with ERCP as below. Had significant improvement in LFTs  Had symptomatic anemia in setting of known MDS. Required 3 units PRBC in hospital. Plan was follow up Dr. Marin Olp outpatient  HTN- remained controlled on home meds in additoin to amlodipine. Try half tablet with BP improved BP Readings from Last 3 Encounters:  09/19/17 118/62  09/12/17 (!) 140/46  09/04/17 (!) 110/58   HLD- holding statin Until LFTs remain controlled  CAD- luckily tolerated surgery well. On aspirin and statin at baseline. Cath 2012 showed largely nonobstructive thankfully  AKI in setting of stage CKD per hospital team- improved with hydration, treatment. I will resolve issue as does not appear to baseline hav eCKD  Hyponatremia- baseline around 130- stable I hospitall  Constipation- was on colace after surgery- BMs have been far more regular now. Now off colace.   AAA discovered on CT  In independently reviewed the CT abd/pelvis. Evidence of signfiicant biliary dilation, cholelithiasis noted, gas in gallbladder noted but no clear gallbladder inflammation. Calcification inferior to right liver lobe. Aneurysm in abdominal aorta noted at 3.5 cm.    See problem  oriented charting as well ROS- level % caveat due to dementia   Past Medical History-  Patient Active Problem List   Diagnosis Date Noted  . Bradycardia 04/27/2016    Priority: High  . Unresponsiveness 04/27/2016    Priority: High  . Moderate dementia, without behavioral disturbance 03/31/2015    Priority: High  . Hyponatremia 03/22/2015    Priority: High  . MDS (myelodysplastic syndrome) ([place]) 05/14/2011    Priority: High  . CAD (coronary artery disease) 09/29/2006    Priority: High  . Choleduodenal fistula s/p cholecystectomy/duodenal resection 09/07/2017 09/07/2017    Priority: Medium  . Choledocholithiasis     Priority: Medium  . Hyperlipidemia 09/29/2006    Priority: Medium  . Essential hypertension 09/29/2006    Priority: Medium  . BPH (benign prostatic hyperplasia) 09/29/2006    Priority: Medium  . Spinal stenosis of lumbar region 03/31/2015    Priority: Low  . Pernicious anemia 03/11/2015    Priority: Low  . Glaucoma 12/01/2014    Priority: Low  . Pulmonary nodule 04/08/2012    Priority: Low  . ANEMIA, B12 DEFICIENCY 07/03/2007    Priority: Low  . GERD 09/29/2006    Priority: Low  . Fall 09/04/2017  . AAA (abdominal aortic aneurysm) ([place]) 09/04/2017    Medications- reviewed and updated  A medical reconciliation was performed comparing current medicines to hospital discharge medications. Current Outpatient Prescriptions  Medication Sig Dispense Refill  . amLODipine (NORVASC) 5 MG tablet Take 1 tablet (5 mg total) by mouth daily. 30 tablet 0  . aspirin 81 MG tablet Take 81 mg by mouth daily.      . carbidopa-levodopa (SINEMET IR) 25-100 MG tablet  Take 1/2 tablet three times a day (Patient taking differently: Take 0.5 tablets by mouth 3 (three) times daily. Take 1/2 tablet three times a day) 135 tablet 3  . [employer] OMEGA-3 KRILL OIL PO Take 350 mg by mouth daily. Reported on 01/16/2016    . cyanocobalamin 100 MCG tablet Take 100 mcg by mouth daily.     [name] docusate  sodium (COLACE) 100 MG capsule Take 1 capsule (100 mg total) by mouth 2 (two) times daily. 10 capsule 0  . dorzolamide (TRUSOPT) 2 % ophthalmic solution Place 1 drop into both eyes 2 (two) times daily.      [name] doxazosin (CARDURA) 4 MG tablet TAKE 1 TABLET BY MOUTH AT  BEDTIME 90 tablet 3  . epoetin alfa (PROCRIT) 74259 UNIT/ML injection Inject 1 mL (40,000 Units total) into the skin every 21 ( twenty-one) days. As needed for Hgb < 10g/dl 4 mL 3  . lisinopril (PRINIVIL,ZESTRIL) 5 MG tablet TAKE 1 TABLET BY MOUTH  DAILY 90 tablet 3  . LUMIGAN 0.01 % SOLN Place 1 drop into both eyes at bedtime.     . memantine (NAMENDA) 10 MG tablet TAKE 1 TABLET BY MOUTH TWO  TIMES DAILY 180 tablet 3  . Multiple Vitamin (MULTIVITAMIN) tablet Take 1 tablet by mouth daily.      [name] omeprazole (PRILOSEC) 20 MG capsule TAKE 1 CAPSULE BY MOUTH  DAILY 90 capsule 3  . Probiotic Product ([employer] PROBIOTIC) CHEW Chew 1 tablet by mouth daily.     . Pyridoxine HCl (VITAMIN B-6) 250 MG tablet Take 1 tablet (250 mg total) by mouth daily. 90 tablet 3  . simvastatin (ZOCOR) 40 MG tablet Hold for 2 to 4 weeks and recheck liver function tests at that time and if stable, can be restarted as per discretion of the PCP. 15 tablet 0   No current facility-administered medications for this visit.     Objective: BP 118/62 (BP Location: Left Arm, Patient Position: Sitting, Cuff Size: Large)   Pulse 69   Temp 98.5 F (36.9 C) (Oral)   SpO2 96%  Gen: NAD, resting comfortably, severely slumped over in chair CV: RRR no murmurs rubs or gallops Lungs: CTAB no crackles, wheeze, rhonchi Abdomen: soft/nontender/nondistended/normal bowel sounds. Well healing surgical scars Ext: no edema under compression stockings Skin: warm, dry Neuro: pleasantly confused, wheelchair bound  Assessment/Plan:  Essential hypertension Was started on amlodipine 5mg  in hospital- home BPs have been very well controlled. BP low on diastolic today.  Still taking his  cardura and lisinopril 5mg . We opted to cut amlodipine to 2.5mg  and follow up in 3 weeks by phone- if BPs look great at home will continue half dose- if remain <120/70 could stop amlodipine  Choledocholithiasis Now s/p resolution after surgical intervention. Has had drastic improvement in bilirubin from 10 to 1.5. LFTs otherwise largely normalized.   Choleduodenal fistula s/p cholecystectomy/duodenal resection 09/07/2017 Also had formation of duodenal fistula from choleduodenal fistula- now s/p cholecystectomy/duodenal resection  Hyperlipidemia LFTs largely normalized- slightly high bilirubin- will have patient restart statin in 1 more week.   MDS (myelodysplastic syndrome) ([facility]) Required 3 units when in hospital. Hgb appears to have stabilized now Would not want to consider hospice as does not believe could have procrit anymore. Unfortunately home injections have not worked out as cost far too high  Hyponatremia Baseline near 130. 124 today- suspect dehydration once getting ome- not drinking enough fluids and have encouraged to increase fluid intake. Repeat bmet with home health if  able in 2 weeks.   CAD (coronary artery disease) Compliant with aspirin. Holding statin for 1 more week. Discussed briefly with daughter withdrawing statin given comorbidities but with CAD history she prefers to maintain it  Acute renal failure superimposed on stage 3 chronic kidney disease ([place]) I will resolve this issue- did have worsening kidney function but does not appear to have CKD III at baseline   Future Appointments Date Time Provider [place]  10/16/2017 3:45 PM Gardiner Barefoot, DPM [facility]  10/25/2017 10:00 AM Marin Olp, MD [facility] None  11/01/2017 3:00 PM Cameron Sprang, MD [facility] None   Orders Placed This Encounter  Procedures  . CBC with Differential/Platelet  . Comprehensive metabolic panel    [place]   Return precautions advised.  [name],  MD

## 2017-09-19 NOTE — Telephone Encounter (Signed)
Dr. Yong Channel okay to continue PT for pt?

## 2017-09-19 NOTE — Assessment & Plan Note (Signed)
Abd [place] in 2 years from today to follow up on AAA 3.5 cm

## 2017-09-19 NOTE — Assessment & Plan Note (Addendum)
Required 3 units when in hospital. Hgb appears to have stabilized now Would not want to consider hospice as does not believe could have procrit anymore. Unfortunately home injections have not worked out as cost far too high

## 2017-09-20 ENCOUNTER — Telehealth: Payer: Self-pay | Admitting: Family Medicine

## 2017-09-20 DIAGNOSIS — D469 Myelodysplastic syndrome, unspecified: Secondary | ICD-10-CM | POA: Diagnosis not present

## 2017-09-20 DIAGNOSIS — D510 Vitamin B12 deficiency anemia due to intrinsic factor deficiency: Secondary | ICD-10-CM | POA: Diagnosis not present

## 2017-09-20 DIAGNOSIS — G3183 Dementia with Lewy bodies: Secondary | ICD-10-CM | POA: Diagnosis not present

## 2017-09-20 DIAGNOSIS — I2510 Atherosclerotic heart disease of native coronary artery without angina pectoris: Secondary | ICD-10-CM | POA: Diagnosis not present

## 2017-09-20 DIAGNOSIS — F0280 Dementia in other diseases classified elsewhere without behavioral disturbance: Secondary | ICD-10-CM | POA: Diagnosis not present

## 2017-09-20 DIAGNOSIS — M48061 Spinal stenosis, lumbar region without neurogenic claudication: Secondary | ICD-10-CM | POA: Diagnosis not present

## 2017-09-20 NOTE — Telephone Encounter (Signed)
Patient's daughter returning missed call to discuss patient's labs. Please call and advise.

## 2017-09-20 NOTE — Telephone Encounter (Signed)
See result notes. [name] aware.

## 2017-09-20 NOTE — Telephone Encounter (Signed)
Spoke to [facility], Ailene Ravel and gave verbal order per Dr Yong Channel OK to continue PT.

## 2017-09-23 DIAGNOSIS — F0280 Dementia in other diseases classified elsewhere without behavioral disturbance: Secondary | ICD-10-CM | POA: Diagnosis not present

## 2017-09-23 DIAGNOSIS — D510 Vitamin B12 deficiency anemia due to intrinsic factor deficiency: Secondary | ICD-10-CM | POA: Diagnosis not present

## 2017-09-23 DIAGNOSIS — G3183 Dementia with Lewy bodies: Secondary | ICD-10-CM | POA: Diagnosis not present

## 2017-09-23 DIAGNOSIS — M48061 Spinal stenosis, lumbar region without neurogenic claudication: Secondary | ICD-10-CM | POA: Diagnosis not present

## 2017-09-23 DIAGNOSIS — I2510 Atherosclerotic heart disease of native coronary artery without angina pectoris: Secondary | ICD-10-CM | POA: Diagnosis not present

## 2017-09-23 DIAGNOSIS — D469 Myelodysplastic syndrome, unspecified: Secondary | ICD-10-CM | POA: Diagnosis not present

## 2017-09-25 DIAGNOSIS — G3183 Dementia with Lewy bodies: Secondary | ICD-10-CM | POA: Diagnosis not present

## 2017-09-25 DIAGNOSIS — I2510 Atherosclerotic heart disease of native coronary artery without angina pectoris: Secondary | ICD-10-CM | POA: Diagnosis not present

## 2017-09-25 DIAGNOSIS — M48061 Spinal stenosis, lumbar region without neurogenic claudication: Secondary | ICD-10-CM | POA: Diagnosis not present

## 2017-09-25 DIAGNOSIS — F0280 Dementia in other diseases classified elsewhere without behavioral disturbance: Secondary | ICD-10-CM | POA: Diagnosis not present

## 2017-09-25 DIAGNOSIS — D469 Myelodysplastic syndrome, unspecified: Secondary | ICD-10-CM | POA: Diagnosis not present

## 2017-09-25 DIAGNOSIS — D510 Vitamin B12 deficiency anemia due to intrinsic factor deficiency: Secondary | ICD-10-CM | POA: Diagnosis not present

## 2017-09-26 DIAGNOSIS — D469 Myelodysplastic syndrome, unspecified: Secondary | ICD-10-CM | POA: Diagnosis not present

## 2017-09-26 DIAGNOSIS — M48061 Spinal stenosis, lumbar region without neurogenic claudication: Secondary | ICD-10-CM | POA: Diagnosis not present

## 2017-09-26 DIAGNOSIS — I2510 Atherosclerotic heart disease of native coronary artery without angina pectoris: Secondary | ICD-10-CM | POA: Diagnosis not present

## 2017-09-26 DIAGNOSIS — G3183 Dementia with Lewy bodies: Secondary | ICD-10-CM | POA: Diagnosis not present

## 2017-09-26 DIAGNOSIS — F0280 Dementia in other diseases classified elsewhere without behavioral disturbance: Secondary | ICD-10-CM | POA: Diagnosis not present

## 2017-09-26 DIAGNOSIS — D510 Vitamin B12 deficiency anemia due to intrinsic factor deficiency: Secondary | ICD-10-CM | POA: Diagnosis not present

## 2017-09-27 DIAGNOSIS — D469 Myelodysplastic syndrome, unspecified: Secondary | ICD-10-CM | POA: Diagnosis not present

## 2017-09-27 DIAGNOSIS — I2510 Atherosclerotic heart disease of native coronary artery without angina pectoris: Secondary | ICD-10-CM | POA: Diagnosis not present

## 2017-09-27 DIAGNOSIS — G3183 Dementia with Lewy bodies: Secondary | ICD-10-CM | POA: Diagnosis not present

## 2017-09-27 DIAGNOSIS — M48061 Spinal stenosis, lumbar region without neurogenic claudication: Secondary | ICD-10-CM | POA: Diagnosis not present

## 2017-09-27 DIAGNOSIS — F0280 Dementia in other diseases classified elsewhere without behavioral disturbance: Secondary | ICD-10-CM | POA: Diagnosis not present

## 2017-09-27 DIAGNOSIS — D510 Vitamin B12 deficiency anemia due to intrinsic factor deficiency: Secondary | ICD-10-CM | POA: Diagnosis not present

## 2017-09-30 ENCOUNTER — Telehealth: Payer: Self-pay | Admitting: Family Medicine

## 2017-09-30 DIAGNOSIS — I2510 Atherosclerotic heart disease of native coronary artery without angina pectoris: Secondary | ICD-10-CM | POA: Diagnosis not present

## 2017-09-30 DIAGNOSIS — G3183 Dementia with Lewy bodies: Secondary | ICD-10-CM | POA: Diagnosis not present

## 2017-09-30 DIAGNOSIS — D469 Myelodysplastic syndrome, unspecified: Secondary | ICD-10-CM | POA: Diagnosis not present

## 2017-09-30 DIAGNOSIS — M48061 Spinal stenosis, lumbar region without neurogenic claudication: Secondary | ICD-10-CM | POA: Diagnosis not present

## 2017-09-30 DIAGNOSIS — D510 Vitamin B12 deficiency anemia due to intrinsic factor deficiency: Secondary | ICD-10-CM | POA: Diagnosis not present

## 2017-09-30 DIAGNOSIS — F0280 Dementia in other diseases classified elsewhere without behavioral disturbance: Secondary | ICD-10-CM | POA: Diagnosis not present

## 2017-09-30 NOTE — Telephone Encounter (Signed)
[name]  Verbal orders: 1x a week for 2 wks 2x a week for 2 wks Functional transfers & mobility AD[facility]  Has a need for a hospital bed. Has pressure wound, heart issues, and Parkinsons.  Ty,  -LL

## 2017-09-30 NOTE — Telephone Encounter (Signed)
Please change to 1x for 3 wks  And 2x a week for 3 wks.  Ty,  -LL

## 2017-10-01 ENCOUNTER — Other Ambulatory Visit: Payer: Self-pay | Admitting: Family Medicine

## 2017-10-01 NOTE — Telephone Encounter (Signed)
Called and left a voicemail message asking for a return phone call. I have a question regarding the hospital bed.

## 2017-10-02 DIAGNOSIS — I2510 Atherosclerotic heart disease of native coronary artery without angina pectoris: Secondary | ICD-10-CM | POA: Diagnosis not present

## 2017-10-02 DIAGNOSIS — F0280 Dementia in other diseases classified elsewhere without behavioral disturbance: Secondary | ICD-10-CM | POA: Diagnosis not present

## 2017-10-02 DIAGNOSIS — R799 Abnormal finding of blood chemistry, unspecified: Secondary | ICD-10-CM | POA: Diagnosis not present

## 2017-10-02 DIAGNOSIS — D510 Vitamin B12 deficiency anemia due to intrinsic factor deficiency: Secondary | ICD-10-CM | POA: Diagnosis not present

## 2017-10-02 DIAGNOSIS — M48061 Spinal stenosis, lumbar region without neurogenic claudication: Secondary | ICD-10-CM | POA: Diagnosis not present

## 2017-10-02 DIAGNOSIS — D469 Myelodysplastic syndrome, unspecified: Secondary | ICD-10-CM | POA: Diagnosis not present

## 2017-10-02 DIAGNOSIS — G3183 Dementia with Lewy bodies: Secondary | ICD-10-CM | POA: Diagnosis not present

## 2017-10-04 ENCOUNTER — Telehealth: Payer: Self-pay | Admitting: Family Medicine

## 2017-10-04 DIAGNOSIS — M48061 Spinal stenosis, lumbar region without neurogenic claudication: Secondary | ICD-10-CM | POA: Diagnosis not present

## 2017-10-04 DIAGNOSIS — F0280 Dementia in other diseases classified elsewhere without behavioral disturbance: Secondary | ICD-10-CM | POA: Diagnosis not present

## 2017-10-04 DIAGNOSIS — I2510 Atherosclerotic heart disease of native coronary artery without angina pectoris: Secondary | ICD-10-CM | POA: Diagnosis not present

## 2017-10-04 DIAGNOSIS — D510 Vitamin B12 deficiency anemia due to intrinsic factor deficiency: Secondary | ICD-10-CM | POA: Diagnosis not present

## 2017-10-04 DIAGNOSIS — D469 Myelodysplastic syndrome, unspecified: Secondary | ICD-10-CM | POA: Diagnosis not present

## 2017-10-04 DIAGNOSIS — G3183 Dementia with Lewy bodies: Secondary | ICD-10-CM | POA: Diagnosis not present

## 2017-10-04 NOTE — Telephone Encounter (Signed)
Pete with [facility] called in reference to patient having a little bit of a "wheeze" today 10/04/17. Laurey Arrow also stated patient has been a little more lathargic and more confused than normal for the past day. Pete wanted Dr. Yong Channel aware.

## 2017-10-04 NOTE — Telephone Encounter (Signed)
Daughter knows patient well. Would ask for her opinion on if father needs to be seen- happy to have him in if needed

## 2017-10-04 NOTE — Telephone Encounter (Signed)
Please advise 

## 2017-10-07 ENCOUNTER — Telehealth: Payer: Self-pay | Admitting: Podiatry

## 2017-10-07 DIAGNOSIS — M48061 Spinal stenosis, lumbar region without neurogenic claudication: Secondary | ICD-10-CM | POA: Diagnosis not present

## 2017-10-07 DIAGNOSIS — G3183 Dementia with Lewy bodies: Secondary | ICD-10-CM | POA: Diagnosis not present

## 2017-10-07 DIAGNOSIS — D469 Myelodysplastic syndrome, unspecified: Secondary | ICD-10-CM | POA: Diagnosis not present

## 2017-10-07 DIAGNOSIS — I2510 Atherosclerotic heart disease of native coronary artery without angina pectoris: Secondary | ICD-10-CM | POA: Diagnosis not present

## 2017-10-07 DIAGNOSIS — F0280 Dementia in other diseases classified elsewhere without behavioral disturbance: Secondary | ICD-10-CM | POA: Diagnosis not present

## 2017-10-07 DIAGNOSIS — D510 Vitamin B12 deficiency anemia due to intrinsic factor deficiency: Secondary | ICD-10-CM | POA: Diagnosis not present

## 2017-10-07 NOTE — Telephone Encounter (Signed)
Spoke with patient's daughter.  She states a nurse with [facility] came and evaluated patient.  She did not hear any "noise" in the patient's lungs.  They think the wheezing noise is coming from secretions in the patient's throat.  Daughter states that patient is confused at baseline.  He may be slightly more confused as of late but she doesn't feel this is alarming.  She does not feel he needs to come in for an appointment.  The would like to move forward with getting a hospital bed for patient ASAP.  Patient has fallen out of bed once already due to his legs sometimes twitching uncontrollably at night.  Also they have a hard time getting the patient to stay on his side at night in a regular bed and he does not have the strength to turn himself.  He is beginning to develop sores on his backside from lying on his back.  She said when you put him to bed he will stay in the same position all night long because he cannot turn himself.  A hospital bed will help to be able to prop him up and keep him safe.

## 2017-10-07 NOTE — Telephone Encounter (Signed)
I informed pt's dtr, Ahmar Pickrell there should be no problem with pt having his toenails trimmed while seated in his wheelchair.

## 2017-10-07 NOTE — Telephone Encounter (Signed)
Hello, this is Richardo Priest, daughter and medical power of attorney for my father. I'm calling in regards to his appointment scheduled for next Wednesday 28 November at 3:45 pm. Unfortunately, my father is no longer able to get up into the treatment chair so he will be coming in by transportation and will be in a wheel chair. I was wanting to know if his toenails could be cut with him in the wheelchair and if not we will need to cancel this appointment as he is no longer able to get into the exam chair. If somebody could please call me back at [phone] and let me know if this is possible. Thank you.

## 2017-10-08 DIAGNOSIS — F0280 Dementia in other diseases classified elsewhere without behavioral disturbance: Secondary | ICD-10-CM | POA: Diagnosis not present

## 2017-10-08 DIAGNOSIS — G3183 Dementia with Lewy bodies: Secondary | ICD-10-CM | POA: Diagnosis not present

## 2017-10-08 DIAGNOSIS — D469 Myelodysplastic syndrome, unspecified: Secondary | ICD-10-CM | POA: Diagnosis not present

## 2017-10-08 DIAGNOSIS — M48061 Spinal stenosis, lumbar region without neurogenic claudication: Secondary | ICD-10-CM | POA: Diagnosis not present

## 2017-10-08 DIAGNOSIS — D510 Vitamin B12 deficiency anemia due to intrinsic factor deficiency: Secondary | ICD-10-CM | POA: Diagnosis not present

## 2017-10-08 DIAGNOSIS — I2510 Atherosclerotic heart disease of native coronary artery without angina pectoris: Secondary | ICD-10-CM | POA: Diagnosis not present

## 2017-10-08 NOTE — Telephone Encounter (Signed)
Spoke with Algis Downs, RN today and she requested a hand written script be faxed to their office for a hospital bed and a wheelchair. Requested to fax to [phone].

## 2017-10-14 ENCOUNTER — Telehealth: Payer: Self-pay | Admitting: Family Medicine

## 2017-10-14 DIAGNOSIS — G3183 Dementia with Lewy bodies: Secondary | ICD-10-CM | POA: Diagnosis not present

## 2017-10-14 DIAGNOSIS — F0280 Dementia in other diseases classified elsewhere without behavioral disturbance: Secondary | ICD-10-CM | POA: Diagnosis not present

## 2017-10-14 DIAGNOSIS — D469 Myelodysplastic syndrome, unspecified: Secondary | ICD-10-CM | POA: Diagnosis not present

## 2017-10-14 DIAGNOSIS — M48061 Spinal stenosis, lumbar region without neurogenic claudication: Secondary | ICD-10-CM | POA: Diagnosis not present

## 2017-10-14 DIAGNOSIS — D510 Vitamin B12 deficiency anemia due to intrinsic factor deficiency: Secondary | ICD-10-CM | POA: Diagnosis not present

## 2017-10-14 DIAGNOSIS — I2510 Atherosclerotic heart disease of native coronary artery without angina pectoris: Secondary | ICD-10-CM | POA: Diagnosis not present

## 2017-10-14 NOTE — Telephone Encounter (Signed)
Collier Salina, PT from [facility] called and needs to increase the patient's physical therapy to 2x a week for 2 weeks. Call the number provided to approve.

## 2017-10-15 DIAGNOSIS — D510 Vitamin B12 deficiency anemia due to intrinsic factor deficiency: Secondary | ICD-10-CM | POA: Diagnosis not present

## 2017-10-15 DIAGNOSIS — D469 Myelodysplastic syndrome, unspecified: Secondary | ICD-10-CM | POA: Diagnosis not present

## 2017-10-15 DIAGNOSIS — G3183 Dementia with Lewy bodies: Secondary | ICD-10-CM | POA: Diagnosis not present

## 2017-10-15 DIAGNOSIS — F0280 Dementia in other diseases classified elsewhere without behavioral disturbance: Secondary | ICD-10-CM | POA: Diagnosis not present

## 2017-10-15 DIAGNOSIS — M48061 Spinal stenosis, lumbar region without neurogenic claudication: Secondary | ICD-10-CM | POA: Diagnosis not present

## 2017-10-15 DIAGNOSIS — I2510 Atherosclerotic heart disease of native coronary artery without angina pectoris: Secondary | ICD-10-CM | POA: Diagnosis not present

## 2017-10-15 NOTE — Telephone Encounter (Signed)
Called and spoke to [place] at [facility], verbal order given okay to increase physical therapy 2 x's a week for 2 weeks per Dr. Yong Channel. Collier Salina verbalized understanding.

## 2017-10-15 NOTE — Telephone Encounter (Signed)
Will sign when I see this

## 2017-10-16 ENCOUNTER — Ambulatory Visit (INDEPENDENT_AMBULATORY_CARE_PROVIDER_SITE_OTHER): Payer: Medicare Other | Admitting: Podiatry

## 2017-10-16 ENCOUNTER — Encounter: Payer: Self-pay | Admitting: Podiatry

## 2017-10-16 DIAGNOSIS — M79675 Pain in left toe(s): Secondary | ICD-10-CM

## 2017-10-16 DIAGNOSIS — I739 Peripheral vascular disease, unspecified: Secondary | ICD-10-CM

## 2017-10-16 DIAGNOSIS — B351 Tinea unguium: Secondary | ICD-10-CM | POA: Diagnosis not present

## 2017-10-16 NOTE — Progress Notes (Signed)
Patient ID: Arlone Lenhardt VONDRAK, male   DOB: [birth year], 83 y.o.   MRN: [id number] HPI  Complaint:  Visit Type: Patient returns to my office for continued preventative foot care services. Complaint: Patient states" my nails have grown long and thick and become painful to walk and wear shoes. He presents for preventative foot care services. No changes to ROS  Podiatric Exam: Vascular: dorsalis pedis and posterior tibial pulses are negative. Capillary return is diminished.. Temperature gradient is negative. Skin turgor WNL,   Sensorium: Normal Semmes Weinstein monofilament test. Normal tactile sensation bilaterally.  Nail Exam: Pt has thick disfigured discolored nails with subungual debris noted bilateral entire nail hallux  Ulcer Exam: There is no evidence of ulcer or pre-ulcerative changes or infection. Orthopedic Exam: Muscle tone and strength are WNL. No limitations in general ROM. No crepitus or effusions noted. Foot type and digits show no abnormalities. Bony prominences are unremarkable. Skin: No Porokeratosis. No infection or ulcers  Diagnosis:  Tinea unguium, Pain in right toe, pain in left toes  Treatment & Plan Procedures and Treatment: Consent by patient was obtained for treatment procedures. The patient understood the discussion of treatment and procedures well. All questions were answered thoroughly reviewed. Debridement of mycotic and hypertrophic toenails, 1 through 5 bilateral and clearing of subungual debris. No ulceration, no infection noted.  Return Visit-Office Procedure: Patient instructed to return to the office for a follow up visit 3 months for continued evaluation and treatment.   Gardiner Barefoot DPM

## 2017-10-17 DIAGNOSIS — I2510 Atherosclerotic heart disease of native coronary artery without angina pectoris: Secondary | ICD-10-CM | POA: Diagnosis not present

## 2017-10-17 DIAGNOSIS — G3183 Dementia with Lewy bodies: Secondary | ICD-10-CM | POA: Diagnosis not present

## 2017-10-17 DIAGNOSIS — D510 Vitamin B12 deficiency anemia due to intrinsic factor deficiency: Secondary | ICD-10-CM | POA: Diagnosis not present

## 2017-10-17 DIAGNOSIS — D469 Myelodysplastic syndrome, unspecified: Secondary | ICD-10-CM | POA: Diagnosis not present

## 2017-10-17 DIAGNOSIS — F0280 Dementia in other diseases classified elsewhere without behavioral disturbance: Secondary | ICD-10-CM | POA: Diagnosis not present

## 2017-10-17 DIAGNOSIS — M48061 Spinal stenosis, lumbar region without neurogenic claudication: Secondary | ICD-10-CM | POA: Diagnosis not present

## 2017-10-18 DIAGNOSIS — D469 Myelodysplastic syndrome, unspecified: Secondary | ICD-10-CM | POA: Diagnosis not present

## 2017-10-18 DIAGNOSIS — F0280 Dementia in other diseases classified elsewhere without behavioral disturbance: Secondary | ICD-10-CM | POA: Diagnosis not present

## 2017-10-18 DIAGNOSIS — M48061 Spinal stenosis, lumbar region without neurogenic claudication: Secondary | ICD-10-CM | POA: Diagnosis not present

## 2017-10-18 DIAGNOSIS — D510 Vitamin B12 deficiency anemia due to intrinsic factor deficiency: Secondary | ICD-10-CM | POA: Diagnosis not present

## 2017-10-18 DIAGNOSIS — I2510 Atherosclerotic heart disease of native coronary artery without angina pectoris: Secondary | ICD-10-CM | POA: Diagnosis not present

## 2017-10-18 DIAGNOSIS — G3183 Dementia with Lewy bodies: Secondary | ICD-10-CM | POA: Diagnosis not present

## 2017-10-21 ENCOUNTER — Telehealth: Payer: Self-pay | Admitting: Family Medicine

## 2017-10-21 NOTE — Telephone Encounter (Signed)
Copied from [place]. Topic: Quick Communication - See Telephone Encounter >> Oct 21, 2017  4:43 PM Malena Catholic I, NT wrote: CRM for notification. See Telephone encounter for:  10/21/17.

## 2017-10-21 NOTE — Telephone Encounter (Signed)
NO information, WRONG office?

## 2017-10-22 ENCOUNTER — Telehealth: Payer: Self-pay | Admitting: Family Medicine

## 2017-10-22 ENCOUNTER — Ambulatory Visit: Payer: Medicare Other | Admitting: Neurology

## 2017-10-22 NOTE — Telephone Encounter (Signed)
Copied from [place]. Topic: Quick Communication - See Telephone Encounter >> Oct 21, 2017  4:43 PM Malena Catholic I, NT wrote: CRM for notification. See Telephone encounter for: pt Daughter  call and want to know if his kneed the app for [facility] if it not necessary she want to cancel it if his need Blood text she prefer the home care agency to do it jest call Daughter Leretha Dykes @ [phone]   10/21/17. >> Oct 22, 2017 12:40 PM Clayborn Bigness R wrote: Delayed message, sent to wrong office [place] ). Also no info in the phone encounter, found this note in the [place]. I have already notified [facility] of this error/s. Corky Downs [facility]

## 2017-10-23 DIAGNOSIS — I2510 Atherosclerotic heart disease of native coronary artery without angina pectoris: Secondary | ICD-10-CM | POA: Diagnosis not present

## 2017-10-23 DIAGNOSIS — M48061 Spinal stenosis, lumbar region without neurogenic claudication: Secondary | ICD-10-CM | POA: Diagnosis not present

## 2017-10-23 DIAGNOSIS — D469 Myelodysplastic syndrome, unspecified: Secondary | ICD-10-CM | POA: Diagnosis not present

## 2017-10-23 DIAGNOSIS — F0280 Dementia in other diseases classified elsewhere without behavioral disturbance: Secondary | ICD-10-CM | POA: Diagnosis not present

## 2017-10-23 DIAGNOSIS — G3183 Dementia with Lewy bodies: Secondary | ICD-10-CM | POA: Diagnosis not present

## 2017-10-23 DIAGNOSIS — D510 Vitamin B12 deficiency anemia due to intrinsic factor deficiency: Secondary | ICD-10-CM | POA: Diagnosis not present

## 2017-10-23 NOTE — Telephone Encounter (Signed)
From last note "Still taking his cardura and lisinopril 5mg . We opted to cut amlodipine to 2.5mg  and follow up in 3 weeks by phone- if BPs look great at home will continue half dose- if remain <120/70 could stop amlodipine"  What have his blood pressures looked liek at home? If <140/90 still may cancel Friday appointment  Can order cmet under hypertension for home health to order. Can do CBC if daughter wants though that likely will be done by hematology

## 2017-10-24 ENCOUNTER — Telehealth: Payer: Self-pay

## 2017-10-24 NOTE — Telephone Encounter (Signed)
Copied from [place]. Topic: Inquiry >> Oct 24, 2017  8:56 AM Aurelio Brash B wrote: Reason for CRM:  Pt cx apt for tomorrow because he was seen nov 1 and didn't feel he needed to come back so soon and  if he needs blood work they can order it from his home health

## 2017-10-24 NOTE — Telephone Encounter (Signed)
See note from yesterday

## 2017-10-24 NOTE — Telephone Encounter (Signed)
Called and left a voicemail message for daughter Alexander Rice to return my call. I want to confirm [facility]

## 2017-10-24 NOTE — Telephone Encounter (Signed)
Called Daughter Curt Bears regarding have blood work drawn. Left voicemail message asking for a return phone call.

## 2017-10-25 ENCOUNTER — Ambulatory Visit: Payer: Medicare Other | Admitting: Family Medicine

## 2017-10-25 DIAGNOSIS — F0280 Dementia in other diseases classified elsewhere without behavioral disturbance: Secondary | ICD-10-CM | POA: Diagnosis not present

## 2017-10-25 DIAGNOSIS — D510 Vitamin B12 deficiency anemia due to intrinsic factor deficiency: Secondary | ICD-10-CM | POA: Diagnosis not present

## 2017-10-25 DIAGNOSIS — M48061 Spinal stenosis, lumbar region without neurogenic claudication: Secondary | ICD-10-CM | POA: Diagnosis not present

## 2017-10-25 DIAGNOSIS — G3183 Dementia with Lewy bodies: Secondary | ICD-10-CM | POA: Diagnosis not present

## 2017-10-25 DIAGNOSIS — D469 Myelodysplastic syndrome, unspecified: Secondary | ICD-10-CM | POA: Diagnosis not present

## 2017-10-25 DIAGNOSIS — I2510 Atherosclerotic heart disease of native coronary artery without angina pectoris: Secondary | ICD-10-CM | POA: Diagnosis not present

## 2017-10-25 NOTE — Telephone Encounter (Signed)
Copied from [place] [id number]. Topic: Quick Communication - Office Called Patient >> Oct 25, 2017  9:45 AM Boyd Kerbs wrote: Curt Bears =, daughter said they are using [facility].

## 2017-10-25 NOTE — Telephone Encounter (Deleted)
Copied from [place] [id number]. Topic: Quick Communication - Office Called Patient >> Oct 25, 2017  9:45 AM Boyd Kerbs wrote: Alexander Rice =, daughter said they are using [facility].

## 2017-10-30 ENCOUNTER — Telehealth: Payer: Self-pay

## 2017-10-30 DIAGNOSIS — F0280 Dementia in other diseases classified elsewhere without behavioral disturbance: Secondary | ICD-10-CM | POA: Diagnosis not present

## 2017-10-30 DIAGNOSIS — G3183 Dementia with Lewy bodies: Secondary | ICD-10-CM | POA: Diagnosis not present

## 2017-10-30 DIAGNOSIS — D469 Myelodysplastic syndrome, unspecified: Secondary | ICD-10-CM | POA: Diagnosis not present

## 2017-10-30 DIAGNOSIS — I2510 Atherosclerotic heart disease of native coronary artery without angina pectoris: Secondary | ICD-10-CM | POA: Diagnosis not present

## 2017-10-30 DIAGNOSIS — D510 Vitamin B12 deficiency anemia due to intrinsic factor deficiency: Secondary | ICD-10-CM | POA: Diagnosis not present

## 2017-10-30 DIAGNOSIS — M48061 Spinal stenosis, lumbar region without neurogenic claudication: Secondary | ICD-10-CM | POA: Diagnosis not present

## 2017-10-30 NOTE — Telephone Encounter (Signed)
Copied from [place]. Topic: Inquiry >> Oct 29, 2017  4:42 PM Patrice Paradise wrote: Reason for CRM: Pt daughter is requesting a call back from Dr. Yong Channel assistant Roselyn Reef. It's in reference to blood work for her father.

## 2017-10-31 DIAGNOSIS — F0280 Dementia in other diseases classified elsewhere without behavioral disturbance: Secondary | ICD-10-CM | POA: Diagnosis not present

## 2017-10-31 DIAGNOSIS — D469 Myelodysplastic syndrome, unspecified: Secondary | ICD-10-CM | POA: Diagnosis not present

## 2017-10-31 DIAGNOSIS — G3183 Dementia with Lewy bodies: Secondary | ICD-10-CM | POA: Diagnosis not present

## 2017-10-31 DIAGNOSIS — I2510 Atherosclerotic heart disease of native coronary artery without angina pectoris: Secondary | ICD-10-CM | POA: Diagnosis not present

## 2017-10-31 DIAGNOSIS — M48061 Spinal stenosis, lumbar region without neurogenic claudication: Secondary | ICD-10-CM | POA: Diagnosis not present

## 2017-10-31 DIAGNOSIS — D510 Vitamin B12 deficiency anemia due to intrinsic factor deficiency: Secondary | ICD-10-CM | POA: Diagnosis not present

## 2017-10-31 NOTE — Telephone Encounter (Signed)
Called [facility] and provided verbal orders for a CMET and a CBC. I called daughter Curt Bears and informed her. She verbalized understanding and was asking if they still needed to continue amlodipine?She wasn't sure of what the last couple of blood pressure readings had been so I advised her to monitor this at home. She stated they had just ordered a new cuff. She will monitor it and let us know if the readings are high

## 2017-11-01 ENCOUNTER — Ambulatory Visit: Payer: Medicare Other | Admitting: Neurology

## 2017-11-01 DIAGNOSIS — I2510 Atherosclerotic heart disease of native coronary artery without angina pectoris: Secondary | ICD-10-CM | POA: Diagnosis not present

## 2017-11-01 DIAGNOSIS — M48061 Spinal stenosis, lumbar region without neurogenic claudication: Secondary | ICD-10-CM | POA: Diagnosis not present

## 2017-11-01 DIAGNOSIS — F0280 Dementia in other diseases classified elsewhere without behavioral disturbance: Secondary | ICD-10-CM | POA: Diagnosis not present

## 2017-11-01 DIAGNOSIS — G3183 Dementia with Lewy bodies: Secondary | ICD-10-CM | POA: Diagnosis not present

## 2017-11-01 DIAGNOSIS — D510 Vitamin B12 deficiency anemia due to intrinsic factor deficiency: Secondary | ICD-10-CM | POA: Diagnosis not present

## 2017-11-01 DIAGNOSIS — D469 Myelodysplastic syndrome, unspecified: Secondary | ICD-10-CM | POA: Diagnosis not present

## 2017-11-02 DIAGNOSIS — I2510 Atherosclerotic heart disease of native coronary artery without angina pectoris: Secondary | ICD-10-CM | POA: Diagnosis not present

## 2017-11-02 DIAGNOSIS — F0281 Dementia in other diseases classified elsewhere with behavioral disturbance: Secondary | ICD-10-CM | POA: Diagnosis not present

## 2017-11-02 DIAGNOSIS — D469 Myelodysplastic syndrome, unspecified: Secondary | ICD-10-CM | POA: Diagnosis not present

## 2017-11-02 DIAGNOSIS — I10 Essential (primary) hypertension: Secondary | ICD-10-CM | POA: Diagnosis not present

## 2017-11-02 DIAGNOSIS — G3183 Dementia with Lewy bodies: Secondary | ICD-10-CM | POA: Diagnosis not present

## 2017-11-02 DIAGNOSIS — M48061 Spinal stenosis, lumbar region without neurogenic claudication: Secondary | ICD-10-CM | POA: Diagnosis not present

## 2017-11-04 ENCOUNTER — Telehealth: Payer: Self-pay

## 2017-11-04 DIAGNOSIS — M48061 Spinal stenosis, lumbar region without neurogenic claudication: Secondary | ICD-10-CM | POA: Diagnosis not present

## 2017-11-04 DIAGNOSIS — I2510 Atherosclerotic heart disease of native coronary artery without angina pectoris: Secondary | ICD-10-CM | POA: Diagnosis not present

## 2017-11-04 DIAGNOSIS — I10 Essential (primary) hypertension: Secondary | ICD-10-CM | POA: Diagnosis not present

## 2017-11-04 DIAGNOSIS — F0281 Dementia in other diseases classified elsewhere with behavioral disturbance: Secondary | ICD-10-CM | POA: Diagnosis not present

## 2017-11-04 DIAGNOSIS — G3183 Dementia with Lewy bodies: Secondary | ICD-10-CM | POA: Diagnosis not present

## 2017-11-04 DIAGNOSIS — D469 Myelodysplastic syndrome, unspecified: Secondary | ICD-10-CM | POA: Diagnosis not present

## 2017-11-04 NOTE — Telephone Encounter (Signed)
Estill Bamberg with [facility] called - unsuccessful at venipuncture today. Will return tomorrow for ordered labs.

## 2017-11-05 ENCOUNTER — Other Ambulatory Visit: Payer: Self-pay

## 2017-11-05 ENCOUNTER — Ambulatory Visit (HOSPITAL_BASED_OUTPATIENT_CLINIC_OR_DEPARTMENT_OTHER): Payer: Medicare Other | Admitting: Family

## 2017-11-05 ENCOUNTER — Ambulatory Visit: Payer: Medicare Other

## 2017-11-05 ENCOUNTER — Other Ambulatory Visit (HOSPITAL_BASED_OUTPATIENT_CLINIC_OR_DEPARTMENT_OTHER): Payer: Medicare Other

## 2017-11-05 ENCOUNTER — Encounter: Payer: Self-pay | Admitting: Family

## 2017-11-05 ENCOUNTER — Telehealth: Payer: Self-pay

## 2017-11-05 ENCOUNTER — Ambulatory Visit (HOSPITAL_COMMUNITY)
Admission: RE | Admit: 2017-11-05 | Discharge: 2017-11-05 | Disposition: A | Discharge status: Home / Self Care | Payer: Medicare Other | Source: Ambulatory Visit | Attending: Hematology & Oncology | Admitting: Hematology & Oncology

## 2017-11-05 VITALS — BP 90/53 | HR 51 | Temp 98.1°F | Resp 18

## 2017-11-05 DIAGNOSIS — D461 Refractory anemia with ring sideroblasts: Secondary | ICD-10-CM

## 2017-11-05 DIAGNOSIS — D469 Myelodysplastic syndrome, unspecified: Secondary | ICD-10-CM | POA: Insufficient documentation

## 2017-11-05 DIAGNOSIS — D630 Anemia in neoplastic disease: Secondary | ICD-10-CM | POA: Insufficient documentation

## 2017-11-05 DIAGNOSIS — D509 Iron deficiency anemia, unspecified: Secondary | ICD-10-CM | POA: Diagnosis not present

## 2017-11-05 DIAGNOSIS — C461 Kaposi's sarcoma of soft tissue: Secondary | ICD-10-CM

## 2017-11-05 DIAGNOSIS — D510 Vitamin B12 deficiency anemia due to intrinsic factor deficiency: Secondary | ICD-10-CM

## 2017-11-05 DIAGNOSIS — M1990 Unspecified osteoarthritis, unspecified site: Secondary | ICD-10-CM | POA: Diagnosis not present

## 2017-11-05 DIAGNOSIS — G20 Parkinson's disease: Secondary | ICD-10-CM

## 2017-11-05 LAB — CBC WITH DIFFERENTIAL (CANCER CENTER ONLY)
BASO#: 0 10*3/uL (ref 0.0–0.2)
BASO%: 0.3 % (ref 0.0–2.0)
EOS ABS: 0.2 10*3/uL (ref 0.0–0.5)
EOS%: 4.7 % (ref 0.0–7.0)
HCT: 21.4 % — ABNORMAL LOW (ref 38.7–49.9)
HEMOGLOBIN: 7.5 g/dL — AB (ref 13.0–17.1)
LYMPH#: 1.1 10*3/uL (ref 0.9–3.3)
LYMPH%: 30.8 % (ref 14.0–48.0)
MCH: 33.2 pg (ref 28.0–33.4)
MCHC: 35 g/dL (ref 32.0–35.9)
MCV: 95 fL (ref 82–98)
MONO#: 0.4 10*3/uL (ref 0.1–0.9)
MONO%: 10.8 % (ref 0.0–13.0)
NEUT#: 1.9 10*3/uL (ref 1.5–6.5)
NEUT%: 53.4 % (ref 40.0–80.0)
PLATELETS: 551 10*3/uL — AB (ref 145–400)
RBC: 2.26 10*6/uL — ABNORMAL LOW (ref 4.20–5.70)
RDW: 19.7 % — AB (ref 11.1–15.7)
WBC: 3.6 10*3/uL — ABNORMAL LOW (ref 4.0–10.0)

## 2017-11-05 LAB — IRON AND TIBC
IRON: 162 ug/dL (ref 42–163)
TIBC: 156 ug/dL — ABNORMAL LOW (ref 202–409)
UIBC: <1 ug/dL (ref 117–376)

## 2017-11-05 LAB — CMP (CANCER CENTER ONLY)
ALBUMIN: 3.3 g/dL (ref 3.3–5.5)
ALT(SGPT): 25 U/L (ref 10–47)
AST: 25 U/L (ref 11–38)
Alkaline Phosphatase: 74 U/L (ref 26–84)
BUN, Bld: 11 mg/dL (ref 7–22)
CHLORIDE: 97 meq/L — AB (ref 98–108)
CO2: 25 meq/L (ref 18–33)
CREATININE: 0.9 mg/dL (ref 0.6–1.2)
Calcium: 9 mg/dL (ref 8.0–10.3)
Glucose, Bld: 108 mg/dL (ref 73–118)
Potassium: 4.6 mEq/L (ref 3.3–4.7)
SODIUM: 134 meq/L (ref 128–145)
Total Bilirubin: 1.2 mg/dl (ref 0.20–1.60)
Total Protein: 5.6 g/dL — ABNORMAL LOW (ref 6.4–8.1)

## 2017-11-05 LAB — PREPARE RBC (CROSSMATCH)

## 2017-11-05 LAB — RETICULOCYTES: RETICULOCYTE COUNT: 2.6 % (ref 0.6–2.6)

## 2017-11-05 LAB — FERRITIN: Ferritin: 1968 ng/ml — ABNORMAL HIGH (ref 22–316)

## 2017-11-05 MED ORDER — EPOETIN ALFA 40000 UNIT/ML IJ SOLN
INTRAMUSCULAR | Status: AC
  Filled 2017-11-05: qty 1

## 2017-11-05 NOTE — Telephone Encounter (Signed)
Copied from [place] #[id number]. Topic: Inquiry >> Nov 04, 2017  4:01 PM Oliver Pila B wrote: Reason for CRM: Leafy Ro from [facility] is needing verbal orders to obtain labs for Wednesday the 19th b/c the pt cant make it on Tuesday, VM can be left

## 2017-11-05 NOTE — Progress Notes (Signed)
Hematology and Oncology Follow Up Visit  Alexander Rice [id number] 06-08-1932 84 y.o. 11/05/2017   Principle Diagnosis:  Refractory anemia with ringed sideroblasts. Pernicious anemia Severe osteoarthritis  Current Therapy:   Procrit 40,000 units subcutaneous every 3 weeks Vitamin B6 250 mg by mouth daily Vitamin B-12 1 mg IM every month - done at home Blood transfusions as indicated-last transfusion on 07/31/2016   Interim History:  [name] is here today with his wife and daughter for follow-up. He is in a wheelchair today too weak to stand. His dementia and Parkinson's continue to progress. He is pleasantly confused so mist information is obtained from his family. He is staying fatigued.  His Hgb is 7.5. He was hospitalized in October with anemia and gall stones. He was transfused with 2 units of blood and his gallbladder was removed He was unable to to get Procrit at home with home health. Unfortunately the cost was too high.  He has had no bleeding but does bruise easily.  No fever, chills, n/v, cough, rash, dizziness, SOB, chest pain, palpitations, abdominal pain or changes in bowel or bladder habits.  He is doing PT twice a week. No tenderness, numbness and tingling in his extremities.  He has maintained a good appetite and is staying well hydrated. He was unable to stand for a weight today.   ECOG Performance Status: 3 - Symptomatic, >50% confined to bed  Medications:  Allergies as of 11/05/2017   No Known Allergies     Medication List        Accurate as of 11/05/17 12:25 PM. Always use your most recent med list.          amLODipine 5 MG tablet Commonly known as:  NORVASC Take 1 tablet (5 mg total) by mouth daily.   aspirin 81 MG tablet Take 81 mg by mouth daily.   carbidopa-levodopa 25-100 MG tablet Commonly known as:  SINEMET IR Take 1/2 tablet three times a day   [facility] OMEGA-3 KRILL OIL PO Take 350 mg by mouth daily. Reported on 01/16/2016   [facility]  PROBIOTIC Chew Chew 1 tablet by mouth daily.   cyanocobalamin 100 MCG tablet Take 100 mcg by mouth daily.   docusate sodium 100 MG capsule Commonly known as:  COLACE Take 1 capsule (100 mg total) by mouth 2 (two) times daily.   dorzolamide 2 % ophthalmic solution Commonly known as:  TRUSOPT Place 1 drop into both eyes 2 (two) times daily.   doxazosin 4 MG tablet Commonly known as:  CARDURA TAKE 1 TABLET BY MOUTH AT  BEDTIME   epoetin alfa 40000 UNIT/ML injection Commonly known as:  PROCRIT Inject 1 mL (40,000 Units total) into the skin every 21 ( twenty-one) days. As needed for Hgb < 10g/dl   lisinopril 5 MG tablet Commonly known as:  PRINIVIL,ZESTRIL TAKE 1 TABLET BY MOUTH  DAILY   LUMIGAN 0.01 % Soln Generic drug:  bimatoprost Place 1 drop into both eyes at bedtime.   memantine 10 MG tablet Commonly known as:  NAMENDA TAKE 1 TABLET BY MOUTH TWO  TIMES DAILY   multivitamin tablet Take 1 tablet by mouth daily.   omeprazole 20 MG capsule Commonly known as:  PRILOSEC TAKE 1 CAPSULE BY MOUTH  DAILY   simvastatin 40 MG tablet Commonly known as:  ZOCOR Hold for 2 to 4 weeks and recheck liver function tests at that time and if stable, can be restarted as per discretion of the PCP.   vitamin B-6  250 MG tablet Take 1 tablet (250 mg total) by mouth daily.       Allergies: No Known Allergies  Past Medical History, Surgical history, Social history, and Family History were reviewed and updated.  Review of Systems: All other 10 point review of systems is negative.   Physical Exam:  oral temperature is 98.1 F (36.7 C). His blood pressure is 90/53 (abnormal) and his pulse is 51 (abnormal). His respiration is 18 and oxygen saturation is 100%.   Wt Readings from Last 3 Encounters:  09/06/17 169 lb (76.7 kg)  07/08/17 170 lb (77.1 kg)  06/17/17 169 lb (76.7 kg)    Ocular: Sclerae unicteric, pupils equal, round and reactive to light Ear-nose-throat: Oropharynx  clear, dentition fair Lymphatic: No cervical, supraclavicular or axillary adenopathy Lungs no rales or rhonchi, good excursion bilaterally Heart regular rate and rhythm, no murmur appreciated Abd soft, nontender, positive bowel sounds, no liver or spleen tip palpated on exam, no fluid wave  MSK no focal spinal tenderness, no joint edema Neuro: non-focal, well-oriented, appropriate affect Breasts: Deferred   Lab Results  Component Value Date   WBC 3.6 (L) 11/05/2017   HGB 7.5 (L) 11/05/2017   HCT 21.4 (L) 11/05/2017   MCV 95 11/05/2017   PLT 551 (H) 11/05/2017   Lab Results  Component Value Date   FERRITIN 1,350 (H) 08/20/2017   IRON 172 (H) 08/20/2017   TIBC 195 (L) 08/20/2017   UIBC 23 (L) 08/20/2017   IRONPCTSAT 88 (H) 08/20/2017   Lab Results  Component Value Date   RETICCTPCT 1.0 11/25/2015   RBC 2.26 (L) 11/05/2017   RETICCTABS 27.2 11/25/2015   Lab Results  Component Value Date   KPAFRELGTCHN 3.09 (H) 04/22/2008   LAMBDASER 1.01 04/22/2008   KAPLAMBRATIO 3.06 (H) 04/22/2008   No results found for: Kandis Cocking, IGMSERUM Lab Results  Component Value Date   TOTALPROTELP 6.3 04/22/2008   ALBUMINELP 66.3 (H) 04/22/2008   A1GS 3.9 04/22/2008   A2GS 8.1 04/22/2008   BETS 5.2 04/22/2008   BETA2SER 4.0 04/22/2008   GAMS 12.5 04/22/2008   MSPIKE NOT DET 04/22/2008   SPEI * 04/22/2008     Chemistry      Component Value Date/Time   NA 124 (L) 09/19/2017 1434   NA 136 08/20/2017 1150   NA 131 (L) 07/30/2016 1039   K 4.7 09/19/2017 1434   K 4.5 08/20/2017 1150   K 4.4 07/30/2016 1039   CL 97 09/19/2017 1434   CL 104 08/20/2017 1150   CO2 22 09/19/2017 1434   CO2 27 08/20/2017 1150   CO2 21 (L) 07/30/2016 1039   BUN 15 09/19/2017 1434   BUN 20 08/20/2017 1150   BUN 20.3 07/30/2016 1039   CREATININE 0.77 09/19/2017 1434   CREATININE 1.1 08/20/2017 1150   CREATININE 0.9 07/30/2016 1039      Component Value Date/Time   CALCIUM 8.5 09/19/2017 1434    CALCIUM 9.0 08/20/2017 1150   CALCIUM 8.7 07/30/2016 1039   ALKPHOS 97 09/19/2017 1434   ALKPHOS 66 08/20/2017 1150   ALKPHOS 47 07/30/2016 1039   AST 28 09/19/2017 1434   AST 28 08/20/2017 1150   AST 17 07/30/2016 1039   ALT 27 09/19/2017 1434   ALT 16 08/20/2017 1150   ALT 18 07/30/2016 1039   BILITOT 1.5 (H) 09/19/2017 1434   BILITOT 1.10 08/20/2017 1150   BILITOT 0.93 07/30/2016 1039      Impression and Plan: Ms. Creasy is a  very pleasant 83 yo caucasian gentleman with myelodysplasia and refractory anemia with ringed sideroblasts. He is feeling fatigued and his Hgb is 7.5.  He is not able to get Procrit at home and with the progression of his Parkinson's and dementia it is very hard for his family to bring him in.  We will transfuse 2 units of blood on Friday and have his labs checked with home health every 2 weeks. His daughter will call with their contact info and fax number for orders.  We will plan to see him again in another 2-3 months.  His family is good to contact our office with any questions or concerns. We can certainly see him sooner if need be.   Laverna Peace, NP 12/18/201812:25 PM

## 2017-11-05 NOTE — Telephone Encounter (Signed)
Called and provided verbal order as requested 

## 2017-11-06 ENCOUNTER — Telehealth: Payer: Self-pay | Admitting: Family Medicine

## 2017-11-06 DIAGNOSIS — G3183 Dementia with Lewy bodies: Secondary | ICD-10-CM | POA: Diagnosis not present

## 2017-11-06 DIAGNOSIS — F0281 Dementia in other diseases classified elsewhere with behavioral disturbance: Secondary | ICD-10-CM | POA: Diagnosis not present

## 2017-11-06 DIAGNOSIS — D150 Benign neoplasm of thymus: Secondary | ICD-10-CM | POA: Diagnosis not present

## 2017-11-06 DIAGNOSIS — I10 Essential (primary) hypertension: Secondary | ICD-10-CM | POA: Diagnosis not present

## 2017-11-06 DIAGNOSIS — M48061 Spinal stenosis, lumbar region without neurogenic claudication: Secondary | ICD-10-CM | POA: Diagnosis not present

## 2017-11-06 DIAGNOSIS — I2510 Atherosclerotic heart disease of native coronary artery without angina pectoris: Secondary | ICD-10-CM | POA: Diagnosis not present

## 2017-11-06 DIAGNOSIS — D469 Myelodysplastic syndrome, unspecified: Secondary | ICD-10-CM | POA: Diagnosis not present

## 2017-11-06 NOTE — Telephone Encounter (Signed)
Please see note.

## 2017-11-06 NOTE — Telephone Encounter (Signed)
Please forward to PCP

## 2017-11-06 NOTE — Telephone Encounter (Signed)
Copied from [place] #[id number]. Topic: Quick Communication - See Telephone Encounter >> Nov 06, 2017 12:00 PM Clack, Laban Emperor wrote: CRM for notification. See Telephone encounter for:  Pt daughter Curt Bears called and stated the fax that was sent over to [facility] for the pt hospital bed and wheelchair was not clear. Request to have that refax. Also [facility] did request to have the "face to face meeting notes" sent over as to why the pt needed the supplies. [facility] number is [phone]. Curt Bears would like Dr. Yong Channel nurse to give her a call at [phone].  11/06/17.

## 2017-11-08 ENCOUNTER — Ambulatory Visit (HOSPITAL_BASED_OUTPATIENT_CLINIC_OR_DEPARTMENT_OTHER): Payer: Medicare Other

## 2017-11-08 DIAGNOSIS — D461 Refractory anemia with ring sideroblasts: Secondary | ICD-10-CM | POA: Diagnosis present

## 2017-11-08 DIAGNOSIS — D469 Myelodysplastic syndrome, unspecified: Secondary | ICD-10-CM

## 2017-11-08 DIAGNOSIS — D630 Anemia in neoplastic disease: Secondary | ICD-10-CM

## 2017-11-08 MED ORDER — ACETAMINOPHEN 325 MG PO TABS
650.0000 mg | ORAL_TABLET | Freq: Once | ORAL | Status: AC
  Administered 2017-11-08: 650 mg via ORAL

## 2017-11-08 MED ORDER — SODIUM CHLORIDE 0.9 % IV SOLN
250.0000 mL | Freq: Once | INTRAVENOUS | Status: AC
  Administered 2017-11-08: 250 mL via INTRAVENOUS

## 2017-11-08 MED ORDER — FUROSEMIDE 10 MG/ML IJ SOLN: 20.0000 mg | Freq: Once | INTRAMUSCULAR | Status: DC

## 2017-11-08 MED ORDER — DIPHENHYDRAMINE HCL 25 MG PO CAPS
ORAL_CAPSULE | ORAL | Status: AC
  Filled 2017-11-08: qty 1

## 2017-11-08 MED ORDER — DIPHENHYDRAMINE HCL 25 MG PO CAPS
25.0000 mg | ORAL_CAPSULE | Freq: Once | ORAL | Status: AC
  Administered 2017-11-08: 25 mg via ORAL

## 2017-11-08 MED ORDER — ACETAMINOPHEN 325 MG PO TABS
ORAL_TABLET | ORAL | Status: AC
  Filled 2017-11-08: qty 2

## 2017-11-08 NOTE — Patient Instructions (Signed)

## 2017-11-08 NOTE — Telephone Encounter (Signed)
Received another CRM today:   CRM for notification. See Telephone encounter for:  Pt daughter Alexander Rice called and stated the fax that was sent over to [facility] for the pt hospital bed and wheelchair was not clear. Request to have that refax. Also [facility] did request to have the "face to face meeting notes" sent over as to why the pt needed the supplies. [facility] number is [phone]. Alexander Rice would like Dr. Yong Channel nurse to give her a call at [phone].  11/06/17.    Please Advise

## 2017-11-08 NOTE — Telephone Encounter (Signed)
Not sure my documentation is adequate for those supplies- I hate to do this but may be best to have patient come back in to have a "face to face" and resubmit requests. You can have [facility] review my last note but not sur ethis is adequate- may also clarify what medical supply store needs

## 2017-11-08 NOTE — Progress Notes (Signed)
1320-Transportation called for pt.'s ride home.  1425-Transportation called again and they said they would be here in a few minutes.  1445-Transportation here to transport pt home.

## 2017-11-09 LAB — TYPE AND SCREEN
ABO/RH(D): A POS
Antibody Screen: NEGATIVE
Unit division: 0
Unit division: 0

## 2017-11-09 LAB — BPAM RBC
BLOOD PRODUCT EXPIRATION DATE: 201901092359
Blood Product Expiration Date: 201901092359
ISSUE DATE / TIME: 201812210827
ISSUE DATE / TIME: 201812210827
Unit Type and Rh: 6200
Unit Type and Rh: 6200

## 2017-11-12 ENCOUNTER — Other Ambulatory Visit: Payer: Self-pay

## 2017-11-12 ENCOUNTER — Encounter (HOSPITAL_COMMUNITY): Payer: Self-pay

## 2017-11-12 ENCOUNTER — Inpatient Hospital Stay (HOSPITAL_COMMUNITY)
Admission: EM | Admit: 2017-11-12 | Discharge: 2017-11-19 | DRG: 470 | Disposition: A | Discharge status: Skilled Nursing Facility | Payer: Medicare Other | Attending: Internal Medicine | Admitting: Internal Medicine

## 2017-11-12 ENCOUNTER — Inpatient Hospital Stay (HOSPITAL_COMMUNITY): Payer: Medicare Other

## 2017-11-12 ENCOUNTER — Emergency Department (HOSPITAL_COMMUNITY): Payer: Medicare Other

## 2017-11-12 DIAGNOSIS — K219 Gastro-esophageal reflux disease without esophagitis: Secondary | ICD-10-CM | POA: Diagnosis present

## 2017-11-12 DIAGNOSIS — L89159 Pressure ulcer of sacral region, unspecified stage: Secondary | ICD-10-CM | POA: Diagnosis not present

## 2017-11-12 DIAGNOSIS — S72001D Fracture of unspecified part of neck of right femur, subsequent encounter for closed fracture with routine healing: Secondary | ICD-10-CM | POA: Diagnosis not present

## 2017-11-12 DIAGNOSIS — R1312 Dysphagia, oropharyngeal phase: Secondary | ICD-10-CM | POA: Diagnosis not present

## 2017-11-12 DIAGNOSIS — W19XXXA Unspecified fall, initial encounter: Secondary | ICD-10-CM | POA: Diagnosis not present

## 2017-11-12 DIAGNOSIS — L8990 Pressure ulcer of unspecified site, unspecified stage: Secondary | ICD-10-CM

## 2017-11-12 DIAGNOSIS — S199XXA Unspecified injury of neck, initial encounter: Secondary | ICD-10-CM | POA: Diagnosis not present

## 2017-11-12 DIAGNOSIS — Y92231 Patient bathroom in hospital as the place of occurrence of the external cause: Secondary | ICD-10-CM | POA: Diagnosis present

## 2017-11-12 DIAGNOSIS — N189 Chronic kidney disease, unspecified: Secondary | ICD-10-CM | POA: Diagnosis present

## 2017-11-12 DIAGNOSIS — Z471 Aftercare following joint replacement surgery: Secondary | ICD-10-CM | POA: Diagnosis not present

## 2017-11-12 DIAGNOSIS — E785 Hyperlipidemia, unspecified: Secondary | ICD-10-CM | POA: Diagnosis present

## 2017-11-12 DIAGNOSIS — D464 Refractory anemia, unspecified: Secondary | ICD-10-CM | POA: Diagnosis present

## 2017-11-12 DIAGNOSIS — E222 Syndrome of inappropriate secretion of antidiuretic hormone: Secondary | ICD-10-CM | POA: Diagnosis present

## 2017-11-12 DIAGNOSIS — G20 Parkinson's disease: Secondary | ICD-10-CM | POA: Diagnosis not present

## 2017-11-12 DIAGNOSIS — Z01818 Encounter for other preprocedural examination: Secondary | ICD-10-CM

## 2017-11-12 DIAGNOSIS — Z7982 Long term (current) use of aspirin: Secondary | ICD-10-CM | POA: Diagnosis not present

## 2017-11-12 DIAGNOSIS — S72009A Fracture of unspecified part of neck of unspecified femur, initial encounter for closed fracture: Secondary | ICD-10-CM | POA: Diagnosis not present

## 2017-11-12 DIAGNOSIS — I714 Abdominal aortic aneurysm, without rupture: Secondary | ICD-10-CM | POA: Diagnosis not present

## 2017-11-12 DIAGNOSIS — M25351 Other instability, right hip: Secondary | ICD-10-CM | POA: Diagnosis not present

## 2017-11-12 DIAGNOSIS — M6281 Muscle weakness (generalized): Secondary | ICD-10-CM | POA: Diagnosis not present

## 2017-11-12 DIAGNOSIS — R41841 Cognitive communication deficit: Secondary | ICD-10-CM | POA: Diagnosis not present

## 2017-11-12 DIAGNOSIS — D62 Acute posthemorrhagic anemia: Secondary | ICD-10-CM | POA: Diagnosis not present

## 2017-11-12 DIAGNOSIS — N400 Enlarged prostate without lower urinary tract symptoms: Secondary | ICD-10-CM | POA: Diagnosis not present

## 2017-11-12 DIAGNOSIS — Z86010 Personal history of colonic polyps: Secondary | ICD-10-CM | POA: Diagnosis not present

## 2017-11-12 DIAGNOSIS — Z79899 Other long term (current) drug therapy: Secondary | ICD-10-CM | POA: Diagnosis not present

## 2017-11-12 DIAGNOSIS — H409 Unspecified glaucoma: Secondary | ICD-10-CM | POA: Diagnosis present

## 2017-11-12 DIAGNOSIS — W1830XA Fall on same level, unspecified, initial encounter: Secondary | ICD-10-CM | POA: Diagnosis present

## 2017-11-12 DIAGNOSIS — F0390 Unspecified dementia without behavioral disturbance: Secondary | ICD-10-CM | POA: Diagnosis not present

## 2017-11-12 DIAGNOSIS — E871 Hypo-osmolality and hyponatremia: Secondary | ICD-10-CM | POA: Diagnosis present

## 2017-11-12 DIAGNOSIS — D513 Other dietary vitamin B12 deficiency anemia: Secondary | ICD-10-CM | POA: Diagnosis not present

## 2017-11-12 DIAGNOSIS — I2510 Atherosclerotic heart disease of native coronary artery without angina pectoris: Secondary | ICD-10-CM | POA: Diagnosis present

## 2017-11-12 DIAGNOSIS — I129 Hypertensive chronic kidney disease with stage 1 through stage 4 chronic kidney disease, or unspecified chronic kidney disease: Secondary | ICD-10-CM | POA: Diagnosis present

## 2017-11-12 DIAGNOSIS — S72041A Displaced fracture of base of neck of right femur, initial encounter for closed fracture: Secondary | ICD-10-CM | POA: Diagnosis not present

## 2017-11-12 DIAGNOSIS — I10 Essential (primary) hypertension: Secondary | ICD-10-CM | POA: Diagnosis not present

## 2017-11-12 DIAGNOSIS — R402441 Other coma, without documented Glasgow coma scale score, or with partial score reported, in the field [EMT or ambulance]: Secondary | ICD-10-CM | POA: Diagnosis not present

## 2017-11-12 DIAGNOSIS — F03C0 Unspecified dementia, severe, without behavioral disturbance, psychotic disturbance, mood disturbance, and anxiety: Secondary | ICD-10-CM | POA: Diagnosis present

## 2017-11-12 DIAGNOSIS — F0280 Dementia in other diseases classified elsewhere without behavioral disturbance: Secondary | ICD-10-CM | POA: Diagnosis not present

## 2017-11-12 DIAGNOSIS — S72001A Fracture of unspecified part of neck of right femur, initial encounter for closed fracture: Secondary | ICD-10-CM | POA: Diagnosis not present

## 2017-11-12 DIAGNOSIS — D469 Myelodysplastic syndrome, unspecified: Secondary | ICD-10-CM | POA: Diagnosis not present

## 2017-11-12 DIAGNOSIS — S0990XA Unspecified injury of head, initial encounter: Secondary | ICD-10-CM | POA: Diagnosis not present

## 2017-11-12 DIAGNOSIS — Z96641 Presence of right artificial hip joint: Secondary | ICD-10-CM | POA: Diagnosis not present

## 2017-11-12 DIAGNOSIS — D649 Anemia, unspecified: Secondary | ICD-10-CM | POA: Diagnosis not present

## 2017-11-12 DIAGNOSIS — R609 Edema, unspecified: Secondary | ICD-10-CM | POA: Diagnosis not present

## 2017-11-12 DIAGNOSIS — R062 Wheezing: Secondary | ICD-10-CM

## 2017-11-12 DIAGNOSIS — S72091A Other fracture of head and neck of right femur, initial encounter for closed fracture: Secondary | ICD-10-CM | POA: Diagnosis not present

## 2017-11-12 DIAGNOSIS — R262 Difficulty in walking, not elsewhere classified: Secondary | ICD-10-CM | POA: Diagnosis not present

## 2017-11-12 DIAGNOSIS — J9811 Atelectasis: Secondary | ICD-10-CM | POA: Diagnosis not present

## 2017-11-12 DIAGNOSIS — S3992XA Unspecified injury of lower back, initial encounter: Secondary | ICD-10-CM | POA: Diagnosis not present

## 2017-11-12 DIAGNOSIS — S728X9A Other fracture of unspecified femur, initial encounter for closed fracture: Secondary | ICD-10-CM | POA: Diagnosis not present

## 2017-11-12 LAB — BASIC METABOLIC PANEL
ANION GAP: 5 (ref 5–15)
BUN: 16 mg/dL (ref 6–20)
CHLORIDE: 91 mmol/L — AB (ref 101–111)
CO2: 22 mmol/L (ref 22–32)
Calcium: 8 mg/dL — ABNORMAL LOW (ref 8.9–10.3)
Creatinine, Ser: 0.75 mg/dL (ref 0.61–1.24)
GFR calc Af Amer: >60 mL/min (ref >60)
GFR calc non Af Amer: >60 mL/min (ref >60)
GLUCOSE: 109 mg/dL — AB (ref 65–99)
POTASSIUM: 4.6 mmol/L (ref 3.5–5.1)
Sodium: 118 mmol/L — CL (ref 135–145)

## 2017-11-12 LAB — I-STAT TROPONIN, ED: Troponin i, poc: 0.02 ng/mL (ref 0.00–0.08)

## 2017-11-12 LAB — CBC WITH DIFFERENTIAL/PLATELET
BASOS ABS: 0 10*3/uL (ref 0.0–0.1)
Basophils Relative: 0 %
Eosinophils Absolute: 0.2 10*3/uL (ref 0.0–0.7)
Eosinophils Relative: 2 %
HEMATOCRIT: 23.5 % — AB (ref 39.0–52.0)
Hemoglobin: 8.4 g/dL — ABNORMAL LOW (ref 13.0–17.0)
LYMPHS ABS: 0.9 10*3/uL (ref 0.7–4.0)
LYMPHS PCT: 9 %
MCH: 32.9 pg (ref 26.0–34.0)
MCHC: 35.7 g/dL (ref 30.0–36.0)
MCV: 92.2 fL (ref 78.0–100.0)
MONO ABS: 0.8 10*3/uL (ref 0.1–1.0)
MONOS PCT: 8 %
NEUTROS ABS: 8.1 10*3/uL — AB (ref 1.7–7.7)
Neutrophils Relative %: 81 %
Platelets: 325 10*3/uL (ref 150–400)
RBC: 2.55 MIL/uL — ABNORMAL LOW (ref 4.22–5.81)
RDW: 18.2 % — AB (ref 11.5–15.5)
WBC: 9.9 10*3/uL (ref 4.0–10.5)

## 2017-11-12 LAB — PROTIME-INR
INR: 1.22
PROTHROMBIN TIME: 15.3 s — AB (ref 11.4–15.2)

## 2017-11-12 MED ORDER — RISAQUAD PO CAPS
1.0000 | ORAL_CAPSULE | Freq: Every day | ORAL | Status: DC
  Administered 2017-11-13 – 2017-11-19 (×6): 1 via ORAL
  Filled 2017-11-12 (×6): qty 1

## 2017-11-12 MED ORDER — DOXAZOSIN MESYLATE 4 MG PO TABS
4.0000 mg | ORAL_TABLET | Freq: Every day | ORAL | Status: DC
  Administered 2017-11-12 – 2017-11-18 (×7): 4 mg via ORAL
  Filled 2017-11-12 (×2): qty 1
  Filled 2017-11-12: qty 2
  Filled 2017-11-12: qty 1
  Filled 2017-11-12: qty 2
  Filled 2017-11-12 (×3): qty 1
  Filled 2017-11-12: qty 2
  Filled 2017-11-12: qty 1

## 2017-11-12 MED ORDER — MORPHINE SULFATE (PF) 2 MG/ML IV SOLN: 0.5000 mg | INTRAVENOUS | Status: DC | PRN

## 2017-11-12 MED ORDER — SODIUM CHLORIDE 0.9 % IV SOLN
INTRAVENOUS | Status: DC
  Administered 2017-11-12 – 2017-11-18 (×13): via INTRAVENOUS

## 2017-11-12 MED ORDER — ENOXAPARIN SODIUM 40 MG/0.4ML ~~LOC~~ SOLN: 40.0000 mg | SUBCUTANEOUS | Status: DC

## 2017-11-12 MED ORDER — VITAMIN B-6 50 MG PO TABS
250.0000 mg | ORAL_TABLET | Freq: Every day | ORAL | Status: DC
  Administered 2017-11-13 – 2017-11-19 (×6): 250 mg via ORAL
  Filled 2017-11-12 (×8): qty 1

## 2017-11-12 MED ORDER — DORZOLAMIDE HCL 2 % OP SOLN
1.0000 [drp] | Freq: Two times a day (BID) | OPHTHALMIC | Status: DC
  Administered 2017-11-12 – 2017-11-19 (×13): 1 [drp] via OPHTHALMIC
  Filled 2017-11-12 (×2): qty 10

## 2017-11-12 MED ORDER — PANTOPRAZOLE SODIUM 40 MG PO TBEC
40.0000 mg | DELAYED_RELEASE_TABLET | Freq: Every day | ORAL | Status: DC
  Administered 2017-11-13 – 2017-11-19 (×6): 40 mg via ORAL
  Filled 2017-11-12 (×6): qty 1

## 2017-11-12 MED ORDER — CARBIDOPA-LEVODOPA 25-100 MG PO TABS
0.5000 | ORAL_TABLET | Freq: Three times a day (TID) | ORAL | Status: DC
  Administered 2017-11-12 – 2017-11-19 (×18): 0.5 via ORAL
  Filled 2017-11-12 (×17): qty 1

## 2017-11-12 MED ORDER — HYDROCODONE-ACETAMINOPHEN 5-325 MG PO TABS
1.0000 | ORAL_TABLET | Freq: Four times a day (QID) | ORAL | Status: DC | PRN
  Administered 2017-11-16 – 2017-11-18 (×3): 1 via ORAL
  Filled 2017-11-12 (×3): qty 1

## 2017-11-12 MED ORDER — LATANOPROST 0.005 % OP SOLN
1.0000 [drp] | Freq: Every day | OPHTHALMIC | Status: DC
  Administered 2017-11-12 – 2017-11-18 (×7): 1 [drp] via OPHTHALMIC
  Filled 2017-11-12: qty 2.5

## 2017-11-12 MED ORDER — ENOXAPARIN SODIUM 40 MG/0.4ML ~~LOC~~ SOLN
40.0000 mg | Freq: Once | SUBCUTANEOUS | Status: AC
  Administered 2017-11-12: 40 mg via SUBCUTANEOUS
  Filled 2017-11-12: qty 0.4

## 2017-11-12 MED ORDER — SIMVASTATIN 40 MG PO TABS
40.0000 mg | ORAL_TABLET | Freq: Every day | ORAL | Status: DC
  Administered 2017-11-12 – 2017-11-14 (×3): 40 mg via ORAL
  Filled 2017-11-12 (×2): qty 1
  Filled 2017-11-12: qty 2

## 2017-11-12 MED ORDER — ADULT MULTIVITAMIN W/MINERALS CH
1.0000 | ORAL_TABLET | Freq: Every day | ORAL | Status: DC
  Administered 2017-11-13 – 2017-11-19 (×6): 1 via ORAL
  Filled 2017-11-12 (×6): qty 1

## 2017-11-12 MED ORDER — VITAMIN B-12 100 MCG PO TABS
100.0000 ug | ORAL_TABLET | Freq: Every day | ORAL | Status: DC
  Administered 2017-11-13 – 2017-11-19 (×6): 100 ug via ORAL
  Filled 2017-11-12 (×7): qty 1

## 2017-11-12 MED ORDER — LISINOPRIL 5 MG PO TABS
5.0000 mg | ORAL_TABLET | Freq: Every day | ORAL | Status: DC
  Administered 2017-11-13: 5 mg via ORAL
  Filled 2017-11-12: qty 1

## 2017-11-12 MED ORDER — MEMANTINE HCL 10 MG PO TABS
10.0000 mg | ORAL_TABLET | Freq: Two times a day (BID) | ORAL | Status: DC
  Administered 2017-11-12 – 2017-11-19 (×13): 10 mg via ORAL
  Filled 2017-11-12 (×13): qty 1

## 2017-11-12 MED ORDER — SODIUM CHLORIDE 0.9 % IV BOLUS (SEPSIS)
1000.0000 mL | Freq: Once | INTRAVENOUS | Status: AC
  Administered 2017-11-12: 1000 mL via INTRAVENOUS

## 2017-11-12 MED ORDER — DOCUSATE SODIUM 100 MG PO CAPS
100.0000 mg | ORAL_CAPSULE | Freq: Two times a day (BID) | ORAL | Status: DC
  Administered 2017-11-12 – 2017-11-19 (×13): 100 mg via ORAL
  Filled 2017-11-12 (×13): qty 1

## 2017-11-12 NOTE — ED Provider Notes (Signed)
[place] DEPT Provider Note   CSN: [id number] Arrival date & time: 11/12/17  1643     History   Chief Complaint Chief Complaint  Patient presents with  . Fall    HPI Alexander Rice is a 83 y.o. male with a past medical history of dementia, Parkinson's, who presents to ED for evaluation of unwitnessed fall that occurred approximately 36 hours ago.  History is provided by daughter.  She states that patient woke up in the middle the night possibly trying to get to the bathroom when he slipped and fell.  The fall was unwitnessed and they are just suspecting that this is what happened.  Patient has been otherwise well since then and was evaluated by EMS when the fall occurred.  However, today when the home health nurse tried to change the patient after having a bowel movement and turned him to his right side, he states that he was in pain.  Daughter states that patient is usually not good at telling his family members if he is in pain due to his dementia.  They state that otherwise his mental status is at baseline.  HPI  Past Medical History:  Diagnosis Date  . Adenomatous colon polyp 02/1996, 02/2011   TA polyp 02/2011  . Anemia in chronic renal disease 11/02/2011  . B12 deficiency   . BPH (benign prostatic hyperplasia)   . CAD (coronary artery disease)   . CVD (cardiovascular disease)   . Dementia   . Diverticulosis   . Esophageal stricture   . GERD (gastroesophageal reflux disease)   . Glaucoma   . Heart palpitations   . Hemorrhoids   . Hiatal hernia   . History of colonic polyps 09/29/2006   Polyps age 45, Dr. Fuller Plan stated no further colonoscopy due to age. Confirmed with daughter given alzheimers, CAD history    . Hyperlipidemia   . Hypertension   . MDS (myelodysplastic syndrome) [facility])     Patient Active Problem List   Diagnosis Date Noted  . Choleduodenal fistula s/p cholecystectomy/duodenal resection 09/07/2017 09/07/2017  .  Choledocholithiasis   . Fall 09/04/2017  . AAA (abdominal aortic aneurysm) ([place]) 09/04/2017  . Bradycardia 04/27/2016  . Unresponsiveness 04/27/2016  . Severe dementia 03/31/2015  . Spinal stenosis of lumbar region 03/31/2015  . Hyponatremia 03/22/2015  . Pernicious anemia 03/11/2015  . Glaucoma 12/01/2014  . Pulmonary nodule 04/08/2012  . MDS (myelodysplastic syndrome) ([place]) 05/14/2011  . ANEMIA, B12 DEFICIENCY 07/03/2007  . Hyperlipidemia 09/29/2006  . Essential hypertension 09/29/2006  . CAD (coronary artery disease) 09/29/2006  . GERD 09/29/2006  . BPH (benign prostatic hyperplasia) 09/29/2006    Past Surgical History:  Procedure Laterality Date  . CAROTID ENDARTERECTOMY    . CHOLECYSTECTOMY N/A 09/07/2017   Procedure: LAPAROSCOPIC CHOLECYSTECTOMY WITH INTRAOPERATIVE CHOLANGIOGRAM, REPAIR OF CHOLECYSTODUODENAL FISTULA, UPPER ENDOSCOPY;  Surgeon: Alphonsa Overall, MD;  Location: [facility]S;  Service: General;  Laterality: N/A;  . COLONOSCOPY    . ERCP N/A 09/06/2017   Procedure: ENDOSCOPIC RETROGRADE CHOLANGIOPANCREATOGRAPHY (ERCP);  Surgeon: Milus Banister, MD;  Location: [facility];  Service: Endoscopy;  Laterality: N/A;  . GLAUCOMA SURGERY Bilateral 11/19/12   pt's report  . INGUINAL HERNIA REPAIR     right side/ twice  . PTCA     stent  . QUADRICEPS REPAIR     muscle attachment  . TONSILLECTOMY         Home Medications    Prior to Admission medications   Medication Sig  Start Date End Date Taking? Authorizing Provider  aspirin 81 MG tablet Take 81 mg by mouth daily.     Yes [provider]  carbidopa-levodopa (SINEMET IR) 25-100 MG tablet Take 1/2 tablet three times a day Patient taking differently: Take 0.5 tablets by mouth 3 (three) times daily. Take 1/2 tablet three times a day 07/08/17  Yes Cameron Sprang, MD  [employer]S OMEGA-3 KRILL OIL PO Take 350 mg by mouth daily. Reported on 01/16/2016   Yes [provider]  cyanocobalamin 100 MCG tablet Take 100  mcg by mouth daily.    Yes [provider]  docusate sodium (COLACE) 100 MG capsule Take 1 capsule (100 mg total) by mouth 2 (two) times daily. 09/12/17  Yes Hosie Poisson, MD  dorzolamide (TRUSOPT) 2 % ophthalmic solution Place 1 drop into both eyes 2 (two) times daily.     Yes [provider]  doxazosin (CARDURA) 4 MG tablet TAKE 1 TABLET BY MOUTH AT  BEDTIME 10/01/17  Yes Marin Olp, MD  lisinopril (PRINIVIL,ZESTRIL) 5 MG tablet TAKE 1 TABLET BY MOUTH  DAILY 05/14/17  Yes Marin Olp, MD  LUMIGAN 0.01 % SOLN Place 1 drop into both eyes at bedtime.  03/23/11  Yes [provider]  memantine (NAMENDA) 10 MG tablet TAKE 1 TABLET BY MOUTH TWO  TIMES DAILY 05/01/17  Yes Cameron Sprang, MD  Multiple Vitamin (MULTIVITAMIN) tablet Take 1 tablet by mouth daily.     Yes [provider]  omeprazole (PRILOSEC) 20 MG capsule TAKE 1 CAPSULE BY MOUTH  DAILY 03/26/17  Yes Marin Olp, MD  Probiotic Product ([employer]S PROBIOTIC) CHEW Chew 1 tablet by mouth daily.    Yes [provider]  Pyridoxine HCl (VITAMIN B-6) 250 MG tablet Take 1 tablet (250 mg total) by mouth daily. 05/17/15  Yes [name], MD  simvastatin (ZOCOR) 40 MG tablet Hold for 2 to 4 weeks and recheck liver function tests at that time and if stable, can be restarted as per discretion of the PCP. Patient taking differently: Take 40 mg by mouth at bedtime.  09/12/17  Yes Hosie Poisson, MD  amLODipine (NORVASC) 5 MG tablet Take 1 tablet (5 mg total) by mouth daily. Patient not taking: Reported on 11/05/2017 09/12/17   Hosie Poisson, MD  epoetin alfa (PROCRIT) 25427 UNIT/ML injection Inject 1 mL (40,000 Units total) into the skin every 21 ( twenty-one) days. As needed for Hgb < 10g/dl 09/13/17   [place], Holli Humbles, NP    Family History Family History  Problem Relation Age of Onset  . COPD Mother   . Alcohol abuse Father     Social History Social History   Tobacco Use  . Smoking  status: Former Smoker    Last attempt to quit: 11/19/1968    Years since quitting: 49.0  . Smokeless tobacco: Never Used  . Tobacco comment: never used tobacco  Substance Use Topics  . Alcohol use: Yes    Alcohol/week: 4.2 oz    Types: 7 Standard drinks or equivalent per week    Comment: Wine/Beer  . Drug use: No     Allergies   Patient has no known allergies.   Review of Systems Review of Systems  Unable to perform ROS: Dementia     Physical Exam Updated Vital Signs BP (!) 141/59 (BP Location: Right Arm)   Pulse 60   Temp 98.7 F (37.1 C) (Oral)   Resp 16   SpO2 96%  Physical Exam  Constitutional: He appears well-developed and well-nourished. No distress.  HENT:  Head: Normocephalic and atraumatic.  Nose: Nose normal.  Eyes: Conjunctivae and EOM are normal. Right eye exhibits no discharge. Left eye exhibits no discharge. No scleral icterus.  Neck: Normal range of motion. Neck supple.  Cardiovascular: Normal rate, regular rhythm, normal heart sounds and intact distal pulses. Exam reveals no gallop and no friction rub.  No murmur heard. Pulmonary/Chest: Effort normal and breath sounds normal. No respiratory distress.  Abdominal: Soft. Bowel sounds are normal. He exhibits no distension. There is no tenderness. There is no guarding.  Musculoskeletal: Normal range of motion. He exhibits no edema.  Patient denies any tenderness to palpation of the hip or lumbar spine. Denies C-spine tenderness.  Able to perform complete range of motion of neck.  Neurological: He is alert. He exhibits normal muscle tone. Coordination normal.  Alert and oriented to self.  Skin: Skin is warm and dry. No rash noted.  No signs of head injury or trauma noted.  Psychiatric: He has a normal mood and affect.  Nursing note and vitals reviewed.    ED Treatments / Results  Labs (all labs ordered are listed, but only abnormal results are displayed) Labs Reviewed  BASIC METABOLIC PANEL -  Abnormal; Notable for the following components:      Result Value   Sodium 118 (*)    Chloride 91 (*)    Glucose, Bld 109 (*)    Calcium 8.0 (*)    All other components within normal limits  CBC WITH DIFFERENTIAL/PLATELET - Abnormal; Notable for the following components:   RBC 2.55 (*)    Hemoglobin 8.4 (*)    HCT 23.5 (*)    RDW 18.2 (*)    Neutro Abs 8.1 (*)    All other components within normal limits  PROTIME-INR - Abnormal; Notable for the following components:   Prothrombin Time 15.3 (*)    All other components within normal limits  I-STAT TROPONIN, ED  TYPE AND SCREEN    EKG  EKG Interpretation None       Radiology Dg Lumbar Spine Complete  Result Date: 11/12/2017 CLINICAL DATA:  Unwitnessed fall EXAM: LUMBAR SPINE - COMPLETE 4+ VIEW COMPARISON:  CT 09/04/2017 FINDINGS: Mild scoliosis of the lumbar spine, apex left. Extensive atherosclerotic vascular disease of the aorta and branch vessels with calcified aneurysm of the mid to distal abdominal aorta, measuring up to 4.7 cm anterior to posterior. Vertebral body heights appear maintained. Multilevel moderate to marked degenerative disc changes. Surgical hardware posterior to L4-L5. IMPRESSION: 1. Scoliosis of the lumbar spine with advanced degenerative change. No acute osseous abnormality 2. Calcified abdominal aortic aneurysm, measures up to 4.8 cm maximum AP, compared with 35 mm on the comparison CT ; follow-up CTA would more accurately characterize the aneurysm and assess for true interval change in size Electronically Signed   By: Donavan Foil M.D.   On: 11/12/2017 18:38   Ct Head Wo Contrast  Result Date: 11/12/2017 CLINICAL DATA:  Unwitnessed fall. EXAM: CT HEAD WITHOUT CONTRAST CT CERVICAL SPINE WITHOUT CONTRAST TECHNIQUE: Multidetector CT imaging of the head and cervical spine was performed following the standard protocol without intravenous contrast. Multiplanar CT image reconstructions of the cervical spine were  also generated. COMPARISON:  None. FINDINGS: CT HEAD FINDINGS Brain: Mild chronic ischemic white matter disease is noted. No mass effect or midline shift is noted. Ventricular size is within normal limits. There is no evidence of mass lesion,  hemorrhage or acute infarction. Vascular: No hyperdense vessel or unexpected calcification. Skull: Normal. Negative for fracture or focal lesion. Sinuses/Orbits: No acute finding. Other: None. CT CERVICAL SPINE FINDINGS Alignment: Mild reversal of normal lordosis is noted most likely due degenerative change. Skull base and vertebrae: No acute fracture. No primary bone lesion or focal pathologic process. Soft tissues and spinal canal: No prevertebral fluid or swelling. No visible canal hematoma. Disc levels: Severe degenerative disc disease is noted at C3-4, C4-5, C5-6, C6-7 and C7-T1. Upper chest: Negative. Other: Mild degenerative changes are seen involving posterior facet joints bilaterally. IMPRESSION: Mild chronic ischemic white matter disease. No acute intracranial abnormality seen. Severe multilevel degenerative disc disease. No acute abnormality seen in the cervical spine. Electronically Signed   By: Marijo Conception, M.D.   On: 11/12/2017 18:15   Ct Cervical Spine Wo Contrast  Result Date: 11/12/2017 CLINICAL DATA:  Unwitnessed fall. EXAM: CT HEAD WITHOUT CONTRAST CT CERVICAL SPINE WITHOUT CONTRAST TECHNIQUE: Multidetector CT imaging of the head and cervical spine was performed following the standard protocol without intravenous contrast. Multiplanar CT image reconstructions of the cervical spine were also generated. COMPARISON:  None. FINDINGS: CT HEAD FINDINGS Brain: Mild chronic ischemic white matter disease is noted. No mass effect or midline shift is noted. Ventricular size is within normal limits. There is no evidence of mass lesion, hemorrhage or acute infarction. Vascular: No hyperdense vessel or unexpected calcification. Skull: Normal. Negative for fracture  or focal lesion. Sinuses/Orbits: No acute finding. Other: None. CT CERVICAL SPINE FINDINGS Alignment: Mild reversal of normal lordosis is noted most likely due degenerative change. Skull base and vertebrae: No acute fracture. No primary bone lesion or focal pathologic process. Soft tissues and spinal canal: No prevertebral fluid or swelling. No visible canal hematoma. Disc levels: Severe degenerative disc disease is noted at C3-4, C4-5, C5-6, C6-7 and C7-T1. Upper chest: Negative. Other: Mild degenerative changes are seen involving posterior facet joints bilaterally. IMPRESSION: Mild chronic ischemic white matter disease. No acute intracranial abnormality seen. Severe multilevel degenerative disc disease. No acute abnormality seen in the cervical spine. Electronically Signed   By: Marijo Conception, M.D.   On: 11/12/2017 18:15   Dg Hips Bilat W Or Wo Pelvis 3-4 Views  Result Date: 11/12/2017 CLINICAL DATA:  Unwitnessed fall EXAM: DG HIP (WITH OR WITHOUT PELVIS) 3-4V BILAT COMPARISON:  CT 09/04/2017 FINDINGS: Surgical hardware at the lumbar spine. SI joints are not widened. Vascular calcifications. Slightly limited positioning of the left hip with trochanters superimposed over the femoral neck. No obvious fracture is seen. Acute right femoral neck fracture with superior migration of the distal femur. No femoral head dislocation. Extensive vascular calcifications. IMPRESSION: 1. Acute right femoral neck fracture.  No femoral head dislocation 2. No definite acute osseous abnormality of the left hip Electronically Signed   By: Donavan Foil M.D.   On: 11/12/2017 18:32    Procedures Procedures (including critical care time)  Medications Ordered in ED Medications  sodium chloride 0.9 % bolus 1,000 mL (1,000 mLs Intravenous New Bag/Given 11/12/17 1922)     Initial Impression / Assessment and Plan / ED Course  I have reviewed the triage vital signs and the nursing notes.  Pertinent labs & imaging results  that were available during my care of the patient were reviewed by me and considered in my medical decision making (see chart for details).     Patient presents to ED for evaluation of fall that occurred 36 hours ago.  He  does have a history of dementia and Parkinson's disease.  He was initially evaluated by EMS who cleared him but then started having right-sided hip pain.  Family member at bedside states that patient is at his mental baseline.  X-ray shows a right femoral neck fracture.  X-ray of lumbar spine returned as negative.  CT of head and neck return as negative.  BMP remarkable for sodium level of 118.  Will give patient fluids, admit to hospitalist and consult orthopedics for surgical options.   Patient discussed with and seen by Dr. Sherry Ruffing.  Final Clinical Impressions(s) / ED Diagnoses   Final diagnoses:  None    ED Discharge Orders    None     Portions of this note were generated with Dragon dictation software. Dictation errors may occur despite best attempts at proofreading.    Delia Heady, PA-C 11/12/17 2032    Teg[place], Gwenyth Allegra, MD 11/13/17 [id number]

## 2017-11-12 NOTE — ED Triage Notes (Signed)
Transported by [facility] from home. Family reports that patient had an unwitnessed fall yesterday and the home health nurse noticed today that patient's LLE appeared to be externally rotated. Pt was seen yesterday for the fall. Patient denies any pain, hx of dementia (alert and oriented x 1--per baseline).

## 2017-11-12 NOTE — ED Notes (Signed)
ED Provider at bedside. 

## 2017-11-12 NOTE — ED Notes (Signed)
Bed: [facility] Expected date:  Expected time:  Means of arrival:  Comments: 

## 2017-11-12 NOTE — ED Notes (Signed)
Bed: WA09 Expected date:  Expected time:  Means of arrival:  Comments: [place] B

## 2017-11-12 NOTE — ED Notes (Signed)
Patient transported to X-ray 

## 2017-11-12 NOTE — ED Notes (Signed)
ED TO INPATIENT HANDOFF REPORT  Name/Age/Gender Alexander Rice 83 y.o. male  Code Status    Code Status Orders  (From admission, onward)        Start     Ordered   11/12/17 2029  Full code  Continuous     11/12/17 2033    Code Status History    Date Active Date Inactive Code Status Order ID Comments User Context   09/04/2017 21:54 09/12/2017 17:50 Full Code [id number]  Ivor Costa, MD ED   04/26/2016 21:11 04/28/2016 18:04 Full Code [id number]  Norval Morton, MD Inpatient   03/22/2015 20:43 03/23/2015 17:00 Full Code [id number]  Ivor Costa, MD Inpatient      Home/SNF/Other Home  Chief Complaint fall/left leg pain  Level of Care/Admitting Diagnosis ED Disposition    ED Disposition Condition [facility] Area: [facility] [[id number]]  Level of Care: Med-Surg [16]  Diagnosis: Closed displaced fracture of right femoral neck [facility]) [[id number]]  Admitting Physician: Doreatha Massed  Attending Physician: Etta Quill [phone]  Estimated length of stay: past midnight tomorrow  Certification:: I certify this patient will need inpatient services for at least 2 midnights  PT Class (Do Not Modify): Inpatient [101]  PT Acc Code (Do Not Modify): Private [1]       Medical History Past Medical History:  Diagnosis Date  . Adenomatous colon polyp 02/1996, 02/2011   TA polyp 02/2011  . Anemia in chronic renal disease 11/02/2011  . B12 deficiency   . BPH (benign prostatic hyperplasia)   . CAD (coronary artery disease)   . CVD (cardiovascular disease)   . Dementia   . Diverticulosis   . Esophageal stricture   . GERD (gastroesophageal reflux disease)   . Glaucoma   . Heart palpitations   . Hemorrhoids   . Hiatal hernia   . History of colonic polyps 09/29/2006   Polyps age 89, Dr. Fuller Plan stated no further colonoscopy due to age. Confirmed with daughter given alzheimers, CAD history    . Hyperlipidemia   . Hypertension   . MDS  (myelodysplastic syndrome) ([place])     Allergies No Known Allergies  IV Location/Drains/Wounds Patient Lines/Drains/Airways Status   Active Line/Drains/Airways    Name:   Placement date:   Placement time:   Site:   Days:   Peripheral IV 11/12/17 Left Forearm   11/12/17    1912    Forearm   less than 1   External Urinary Catheter   09/05/17    0005    -   68   Incision (Closed) 09/07/17 Abdomen Other (Comment)   09/07/17    0930     66   Incision - 4 Ports   09/07/17    -     66   Incision - 5 Ports Abdomen 1: Right;Lateral 2: Right;Mid 3: Superior 4: Medial 5: Umbilicus   83/72/90    2111     66          Labs/Imaging Results for orders placed or performed during the hospital encounter of 11/12/17 (from the past 48 hour(s))  Basic metabolic panel     Status: Abnormal   Collection Time: 11/12/17  6:20 PM  Result Value Ref Range   Sodium 118 (LL) 135 - 145 mmol/L    Comment: CRITICAL RESULT CALLED TO, READ BACK BY AND VERIFIED WITH: S.WEST RN AT 1900 ON 11/12/17 BY S.VANHOORNE    Potassium 4.6 3.5 -  5.1 mmol/L   Chloride 91 (L) 101 - 111 mmol/L   CO2 22 22 - 32 mmol/L   Glucose, Bld 109 (H) 65 - 99 mg/dL   BUN 16 6 - 20 mg/dL   Creatinine, Ser 0.75 0.61 - 1.24 mg/dL   Calcium 8.0 (L) 8.9 - 10.3 mg/dL   GFR calc non Af Amer >60 >60 mL/min   GFR calc Af Amer >60 >60 mL/min    Comment: (NOTE) The eGFR has been calculated using the CKD EPI equation. This calculation has not been validated in all clinical situations. eGFR's persistently <60 mL/min signify possible Chronic Kidney Disease.    Anion gap 5 5 - 15  CBC with Differential     Status: Abnormal   Collection Time: 11/12/17  6:20 PM  Result Value Ref Range   WBC 9.9 4.0 - 10.5 K/uL   RBC 2.55 (L) 4.22 - 5.81 MIL/uL   Hemoglobin 8.4 (L) 13.0 - 17.0 g/dL   HCT 23.5 (L) 39.0 - 52.0 %   MCV 92.2 78.0 - 100.0 fL   MCH 32.9 26.0 - 34.0 pg   MCHC 35.7 30.0 - 36.0 g/dL   RDW 18.2 (H) 11.5 - 15.5 %   Platelets 325 150 - 400  K/uL   Neutrophils Relative % 81 %   Neutro Abs 8.1 (H) 1.7 - 7.7 K/uL   Lymphocytes Relative 9 %   Lymphs Abs 0.9 0.7 - 4.0 K/uL   Monocytes Relative 8 %   Monocytes Absolute 0.8 0.1 - 1.0 K/uL   Eosinophils Relative 2 %   Eosinophils Absolute 0.2 0.0 - 0.7 K/uL   Basophils Relative 0 %   Basophils Absolute 0.0 0.0 - 0.1 K/uL  I-stat troponin, ED     Status: None   Collection Time: 11/12/17  6:37 PM  Result Value Ref Range   Troponin i, poc 0.02 0.00 - 0.08 ng/mL   Comment 3            Comment: Due to the release kinetics of cTnI, a negative result within the first hours of the onset of symptoms does not rule out myocardial infarction with certainty. If myocardial infarction is still suspected, repeat the test at appropriate intervals.   Protime-INR     Status: Abnormal   Collection Time: 11/12/17  7:10 PM  Result Value Ref Range   Prothrombin Time 15.3 (H) 11.4 - 15.2 seconds   INR 1.22    Dg Lumbar Spine Complete  Result Date: 11/12/2017 CLINICAL DATA:  Unwitnessed fall EXAM: LUMBAR SPINE - COMPLETE 4+ VIEW COMPARISON:  CT 09/04/2017 FINDINGS: Mild scoliosis of the lumbar spine, apex left. Extensive atherosclerotic vascular disease of the aorta and branch vessels with calcified aneurysm of the mid to distal abdominal aorta, measuring up to 4.7 cm anterior to posterior. Vertebral body heights appear maintained. Multilevel moderate to marked degenerative disc changes. Surgical hardware posterior to L4-L5. IMPRESSION: 1. Scoliosis of the lumbar spine with advanced degenerative change. No acute osseous abnormality 2. Calcified abdominal aortic aneurysm, measures up to 4.8 cm maximum AP, compared with 35 mm on the comparison CT ; follow-up CTA would more accurately characterize the aneurysm and assess for true interval change in size Electronically Signed   By: Donavan Foil M.D.   On: 11/12/2017 18:38   Ct Head Wo Contrast  Result Date: 11/12/2017 CLINICAL DATA:  Unwitnessed fall.  EXAM: CT HEAD WITHOUT CONTRAST CT CERVICAL SPINE WITHOUT CONTRAST TECHNIQUE: Multidetector CT imaging of the head and cervical  spine was performed following the standard protocol without intravenous contrast. Multiplanar CT image reconstructions of the cervical spine were also generated. COMPARISON:  None. FINDINGS: CT HEAD FINDINGS Brain: Mild chronic ischemic white matter disease is noted. No mass effect or midline shift is noted. Ventricular size is within normal limits. There is no evidence of mass lesion, hemorrhage or acute infarction. Vascular: No hyperdense vessel or unexpected calcification. Skull: Normal. Negative for fracture or focal lesion. Sinuses/Orbits: No acute finding. Other: None. CT CERVICAL SPINE FINDINGS Alignment: Mild reversal of normal lordosis is noted most likely due degenerative change. Skull base and vertebrae: No acute fracture. No primary bone lesion or focal pathologic process. Soft tissues and spinal canal: No prevertebral fluid or swelling. No visible canal hematoma. Disc levels: Severe degenerative disc disease is noted at C3-4, C4-5, C5-6, C6-7 and C7-T1. Upper chest: Negative. Other: Mild degenerative changes are seen involving posterior facet joints bilaterally. IMPRESSION: Mild chronic ischemic white matter disease. No acute intracranial abnormality seen. Severe multilevel degenerative disc disease. No acute abnormality seen in the cervical spine. Electronically Signed   By: Marijo Conception, M.D.   On: 11/12/2017 18:15   Ct Cervical Spine Wo Contrast  Result Date: 11/12/2017 CLINICAL DATA:  Unwitnessed fall. EXAM: CT HEAD WITHOUT CONTRAST CT CERVICAL SPINE WITHOUT CONTRAST TECHNIQUE: Multidetector CT imaging of the head and cervical spine was performed following the standard protocol without intravenous contrast. Multiplanar CT image reconstructions of the cervical spine were also generated. COMPARISON:  None. FINDINGS: CT HEAD FINDINGS Brain: Mild chronic ischemic white  matter disease is noted. No mass effect or midline shift is noted. Ventricular size is within normal limits. There is no evidence of mass lesion, hemorrhage or acute infarction. Vascular: No hyperdense vessel or unexpected calcification. Skull: Normal. Negative for fracture or focal lesion. Sinuses/Orbits: No acute finding. Other: None. CT CERVICAL SPINE FINDINGS Alignment: Mild reversal of normal lordosis is noted most likely due degenerative change. Skull base and vertebrae: No acute fracture. No primary bone lesion or focal pathologic process. Soft tissues and spinal canal: No prevertebral fluid or swelling. No visible canal hematoma. Disc levels: Severe degenerative disc disease is noted at C3-4, C4-5, C5-6, C6-7 and C7-T1. Upper chest: Negative. Other: Mild degenerative changes are seen involving posterior facet joints bilaterally. IMPRESSION: Mild chronic ischemic white matter disease. No acute intracranial abnormality seen. Severe multilevel degenerative disc disease. No acute abnormality seen in the cervical spine. Electronically Signed   By: Marijo Conception, M.D.   On: 11/12/2017 18:15   Chest Portable 1 View  Result Date: 11/12/2017 CLINICAL DATA:  Preop EXAM: PORTABLE CHEST 1 VIEW COMPARISON:  Radiographs 11/12/2017, chest x-ray 09/04/2017, 04/26/2016. FINDINGS: Right azygos lobe. Stable elevation of left diaphragm with minimal basilar atelectasis. Mild cardiomegaly with aortic atherosclerosis. No pneumothorax. Multiple old left upper rib fractures. IMPRESSION: Elevation of the left diaphragm as before with patchy basilar atelectasis. Cardiomegaly without edema or focal pulmonary infiltrate Electronically Signed   By: Donavan Foil M.D.   On: 11/12/2017 20:57   Dg Hips Bilat W Or Wo Pelvis 3-4 Views  Result Date: 11/12/2017 CLINICAL DATA:  Unwitnessed fall EXAM: DG HIP (WITH OR WITHOUT PELVIS) 3-4V BILAT COMPARISON:  CT 09/04/2017 FINDINGS: Surgical hardware at the lumbar spine. SI joints are  not widened. Vascular calcifications. Slightly limited positioning of the left hip with trochanters superimposed over the femoral neck. No obvious fracture is seen. Acute right femoral neck fracture with superior migration of the distal femur. No femoral head  dislocation. Extensive vascular calcifications. IMPRESSION: 1. Acute right femoral neck fracture.  No femoral head dislocation 2. No definite acute osseous abnormality of the left hip Electronically Signed   By: Donavan Foil M.D.   On: 11/12/2017 18:32    Pending Labs Unresulted Labs (From admission, onward)   Start     Ordered   11/13/17 0370  Basic metabolic panel  Tomorrow morning,   R     11/12/17 2104   11/12/17 2103  Osmolality, urine  Once,   R     11/12/17 2102   11/12/17 2102  Sodium, urine, random  Once,   R     11/12/17 2102   11/12/17 2101  Urinalysis, Routine w reflex microscopic  Once,   R     11/12/17 2100   11/12/17 1851  Type and screen [place]  STAT,   STAT    Comments:  [place]    11/12/17 1850      Vitals/Pain Today's Vitals   11/12/17 1657 11/12/17 1846 11/12/17 2117  BP: (!) 131/59 (!) 141/59 (!) 118/52  Pulse: 70 60 70  Resp: _0 Temp: 98.7 F (37.1 C)    TempSrc: Oral    SpO2: 96% 96% 96%    Isolation Precautions No active isolations  Medications Medications  HYDROcodone-acetaminophen (NORCO/VICODIN) 5-325 MG per tablet 1-2 tablet (not administered)  morphine 2 MG/ML injection 0.5 mg (not administered)  carbidopa-levodopa (SINEMET IR) 25-100 MG per tablet immediate release 0.5 tablet (not administered)  doxazosin (CARDURA) tablet 4 mg (not administered)  lisinopril (PRINIVIL,ZESTRIL) tablet 5 mg (not administered)  simvastatin (ZOCOR) tablet 40 mg (not administered)  pyridOXINE (VITAMIN B-6) tablet 250 mg (not administered)  [employer] PROBIOTIC CHEW 1 tablet (not administered)  pantoprazole (PROTONIX) EC tablet 40 mg (not administered)   memantine (NAMENDA) tablet 10 mg (not administered)  multivitamin tablet 1 tablet (not administered)  latanoprost (XALATAN) 0.005 % ophthalmic solution 1 drop (not administered)  docusate sodium (COLACE) capsule 100 mg (not administered)  dorzolamide (TRUSOPT) 2 % ophthalmic solution 1 drop (not administered)  vitamin B-12 (CYANOCOBALAMIN) tablet 100 mcg (not administered)  0.9 %  sodium chloride infusion (not administered)  enoxaparin (LOVENOX) injection 40 mg (not administered)  sodium chloride 0.9 % bolus 1,000 mL (0 mLs Intravenous Stopped 11/12/17 2120)    Mobility non-ambulatory

## 2017-11-12 NOTE — ED Notes (Signed)
rm 1530 2056 bed assigned

## 2017-11-12 NOTE — H&P (Signed)
History and Physical    Alexander Rice RWE:[id number] DOB: 1932/02/05 DOA: 11/12/2017  PCP: Marin Olp, MD  Patient coming from: [facility]  I have personally briefly reviewed patient's old medical records in [place]  Chief Complaint: Fall, R hip pain  HPI: Alexander Rice is a 83 y.o. male with medical history significant of Parkinson's disease, dementia, MDS, HTN.  Patient presents to the ED, had fall on 24th, helped to chair by EMS but not brought in to ED for eval.  Since that time has had severe R hip pain.  Doesn't walk very much at baseline but occasionally gets up with aid of walker.  Brought in to ED for severe R hip pain, worse with movement, constant, and persistent.   ED Course: R femoral neck fx.   Review of Systems: unable to perform due to dementia.  Past Medical History:  Diagnosis Date  . Adenomatous colon polyp 02/1996, 02/2011   TA polyp 02/2011  . Anemia in chronic renal disease 11/02/2011  . B12 deficiency   . BPH (benign prostatic hyperplasia)   . CAD (coronary artery disease)   . CVD (cardiovascular disease)   . Dementia   . Diverticulosis   . Esophageal stricture   . GERD (gastroesophageal reflux disease)   . Glaucoma   . Heart palpitations   . Hemorrhoids   . Hiatal hernia   . History of colonic polyps 09/29/2006   Polyps age 35, Dr. Fuller Plan stated no further colonoscopy due to age. Confirmed with daughter given alzheimers, CAD history    . Hyperlipidemia   . Hypertension   . MDS (myelodysplastic syndrome) ([place])     Past Surgical History:  Procedure Laterality Date  . CAROTID ENDARTERECTOMY    . CHOLECYSTECTOMY N/A 09/07/2017   Procedure: LAPAROSCOPIC CHOLECYSTECTOMY WITH INTRAOPERATIVE CHOLANGIOGRAM, REPAIR OF CHOLECYSTODUODENAL FISTULA, UPPER ENDOSCOPY;  Surgeon: Alphonsa Overall, MD;  Location: [facility];  Service: General;  Laterality: N/A;  . COLONOSCOPY    . ERCP N/A 09/06/2017   Procedure: ENDOSCOPIC RETROGRADE  CHOLANGIOPANCREATOGRAPHY (ERCP);  Surgeon: Milus Banister, MD;  Location: [facility];  Service: Endoscopy;  Laterality: N/A;  . GLAUCOMA SURGERY Bilateral 11/19/12   pt's report  . INGUINAL HERNIA REPAIR     right side/ twice  . PTCA     stent  . QUADRICEPS REPAIR     muscle attachment  . TONSILLECTOMY       reports that he quit smoking about 49 years ago. he has never used smokeless tobacco. He reports that he drinks about 4.2 oz of alcohol per week. He reports that he does not use drugs.  No Known Allergies  Family History  Problem Relation Age of Onset  . COPD Mother   . Alcohol abuse Father      Prior to Admission medications   Medication Sig Start Date End Date Taking? Authorizing Provider  aspirin 81 MG tablet Take 81 mg by mouth daily.     Yes [provider]  carbidopa-levodopa (SINEMET IR) 25-100 MG tablet Take 1/2 tablet three times a day Patient taking differently: Take 0.5 tablets by mouth 3 (three) times daily. Take 1/2 tablet three times a day 07/08/17  Yes Cameron Sprang, MD  [employer] OMEGA-3 KRILL OIL PO Take 350 mg by mouth daily. Reported on 01/16/2016   Yes [provider]  cyanocobalamin 100 MCG tablet Take 100 mcg by mouth daily.    Yes [provider]  docusate sodium (COLACE) 100 MG capsule  Take 1 capsule (100 mg total) by mouth 2 (two) times daily. 09/12/17  Yes Hosie Poisson, MD  dorzolamide (TRUSOPT) 2 % ophthalmic solution Place 1 drop into both eyes 2 (two) times daily.     Yes [provider]  doxazosin (CARDURA) 4 MG tablet TAKE 1 TABLET BY MOUTH AT  BEDTIME 10/01/17  Yes Marin Olp, MD  lisinopril (PRINIVIL,ZESTRIL) 5 MG tablet TAKE 1 TABLET BY MOUTH  DAILY 05/14/17  Yes Marin Olp, MD  LUMIGAN 0.01 % SOLN Place 1 drop into both eyes at bedtime.  03/23/11  Yes [provider]  memantine (NAMENDA) 10 MG tablet TAKE 1 TABLET BY MOUTH TWO  TIMES DAILY 05/01/17  Yes Cameron Sprang, MD  Multiple Vitamin  (MULTIVITAMIN) tablet Take 1 tablet by mouth daily.     Yes [provider]  omeprazole (PRILOSEC) 20 MG capsule TAKE 1 CAPSULE BY MOUTH  DAILY 03/26/17  Yes Marin Olp, MD  Probiotic Product ([employer] PROBIOTIC) CHEW Chew 1 tablet by mouth daily.    Yes [provider]  Pyridoxine HCl (VITAMIN B-6) 250 MG tablet Take 1 tablet (250 mg total) by mouth daily. 05/17/15  Yes [name], MD  simvastatin (ZOCOR) 40 MG tablet Hold for 2 to 4 weeks and recheck liver function tests at that time and if stable, can be restarted as per discretion of the PCP. Patient taking differently: Take 40 mg by mouth at bedtime.  09/12/17  Yes Hosie Poisson, MD  epoetin alfa (PROCRIT) 37628 UNIT/ML injection Inject 1 mL (40,000 Units total) into the skin every 21 ( twenty-one) days. As needed for Hgb < 10g/dl 09/13/17   [place], Holli Humbles, NP    Physical Exam: Vitals:   11/12/17 1657 11/12/17 1846  BP: (!) 131/59 (!) 141/59  Pulse: 70 60  Resp: 16 16  Temp: 98.7 F (37.1 C)   TempSrc: Oral   SpO2: 96% 96%    Constitutional: NAD, calm, comfortable Eyes: PERRL, lids and conjunctivae normal ENMT: Mucous membranes are moist. Posterior pharynx clear of any exudate or lesions.Normal dentition.  Neck: normal, supple, no masses, no thyromegaly Respiratory: clear to auscultation bilaterally, no wheezing, no crackles. Normal respiratory effort. No accessory muscle use.  Cardiovascular: Regular rate and rhythm, no murmurs / rubs / gallops. No extremity edema. 2+ pedal pulses. No carotid bruits.  Abdomen: no tenderness, no masses palpated. No hepatosplenomegaly. Bowel sounds positive.  Musculoskeletal: no clubbing / cyanosis. No joint deformity upper and lower extremities. Good ROM, no contractures. Normal muscle tone.  Skin: no rashes, lesions, ulcers. No induration Neurologic: CN 2-12 grossly intact. Sensation intact, DTR normal. Strength 5/5 in all 4.  Psychiatric: Normal judgment and insight.  Alert and oriented x 3. Normal mood.    Labs on Admission: I have personally reviewed following labs and imaging studies  CBC: Recent Labs  Lab 11/12/17 1820  WBC 9.9  NEUTROABS 8.1*  HGB 8.4*  HCT 23.5*  MCV 92.2  PLT 315   Basic Metabolic Panel: Recent Labs  Lab 11/12/17 1820  NA 118*  K 4.6  CL 91*  CO2 22  GLUCOSE 109*  BUN 16  CREATININE 0.75  CALCIUM 8.0*   GFR: CrCl cannot be calculated (Unknown ideal weight.). Liver Function Tests: No results for input(s): AST, ALT, ALKPHOS, BILITOT, PROT, ALBUMIN in the last 168 hours. No results for input(s): LIPASE, AMYLASE in the last 168 hours. No results for input(s): AMMONIA in the last 168 hours. Coagulation Profile:  Recent Labs  Lab 11/12/17 1910  INR 1.22   Cardiac Enzymes: No results for input(s): CKTOTAL, CKMB, CKMBINDEX, TROPONINI in the last 168 hours. BNP (last 3 results) No results for input(s): PROBNP in the last 8760 hours. HbA1C: No results for input(s): HGBA1C in the last 72 hours. CBG: No results for input(s): GLUCAP in the last 168 hours. Lipid Profile: No results for input(s): CHOL, HDL, LDLCALC, TRIG, CHOLHDL, LDLDIRECT in the last 72 hours. Thyroid Function Tests: No results for input(s): TSH, T4TOTAL, FREET4, T3FREE, THYROIDAB in the last 72 hours. Anemia Panel: No results for input(s): VITAMINB12, FOLATE, FERRITIN, TIBC, IRON, RETICCTPCT in the last 72 hours. Urine analysis:    Component Value Date/Time   COLORURINE AMBER (A) 09/04/2017 2003   APPEARANCEUR CLEAR 09/04/2017 2003   LABSPEC 1.020 09/04/2017 2003   PHURINE 5.0 09/04/2017 2003   GLUCOSEU NEGATIVE 09/04/2017 2003   HGBUR SMALL (A) 09/04/2017 2003   HGBUR negative 10/11/2009 0819   BILIRUBINUR SMALL (A) 09/04/2017 2003   BILIRUBINUR n 04/18/2016 [place] 09/04/2017 2003   PROTEINUR NEGATIVE 09/04/2017 2003   UROBILINOGEN 1.0 04/18/2016 1152   UROBILINOGEN 0.2 10/11/2009 0819   NITRITE NEGATIVE  09/04/2017 2003   LEUKOCYTESUR NEGATIVE 09/04/2017 2003    Radiological Exams on Admission: Dg Lumbar Spine Complete  Result Date: 11/12/2017 CLINICAL DATA:  Unwitnessed fall EXAM: LUMBAR SPINE - COMPLETE 4+ VIEW COMPARISON:  CT 09/04/2017 FINDINGS: Mild scoliosis of the lumbar spine, apex left. Extensive atherosclerotic vascular disease of the aorta and branch vessels with calcified aneurysm of the mid to distal abdominal aorta, measuring up to 4.7 cm anterior to posterior. Vertebral body heights appear maintained. Multilevel moderate to marked degenerative disc changes. Surgical hardware posterior to L4-L5. IMPRESSION: 1. Scoliosis of the lumbar spine with advanced degenerative change. No acute osseous abnormality 2. Calcified abdominal aortic aneurysm, measures up to 4.8 cm maximum AP, compared with 35 mm on the comparison CT ; follow-up CTA would more accurately characterize the aneurysm and assess for true interval change in size Electronically Signed   By: Donavan Foil M.D.   On: 11/12/2017 18:38   Ct Head Wo Contrast  Result Date: 11/12/2017 CLINICAL DATA:  Unwitnessed fall. EXAM: CT HEAD WITHOUT CONTRAST CT CERVICAL SPINE WITHOUT CONTRAST TECHNIQUE: Multidetector CT imaging of the head and cervical spine was performed following the standard protocol without intravenous contrast. Multiplanar CT image reconstructions of the cervical spine were also generated. COMPARISON:  None. FINDINGS: CT HEAD FINDINGS Brain: Mild chronic ischemic white matter disease is noted. No mass effect or midline shift is noted. Ventricular size is within normal limits. There is no evidence of mass lesion, hemorrhage or acute infarction. Vascular: No hyperdense vessel or unexpected calcification. Skull: Normal. Negative for fracture or focal lesion. Sinuses/Orbits: No acute finding. Other: None. CT CERVICAL SPINE FINDINGS Alignment: Mild reversal of normal lordosis is noted most likely due degenerative change. Skull base  and vertebrae: No acute fracture. No primary bone lesion or focal pathologic process. Soft tissues and spinal canal: No prevertebral fluid or swelling. No visible canal hematoma. Disc levels: Severe degenerative disc disease is noted at C3-4, C4-5, C5-6, C6-7 and C7-T1. Upper chest: Negative. Other: Mild degenerative changes are seen involving posterior facet joints bilaterally. IMPRESSION: Mild chronic ischemic white matter disease. No acute intracranial abnormality seen. Severe multilevel degenerative disc disease. No acute abnormality seen in the cervical spine. Electronically Signed   By: Marijo Conception, M.D.   On: 11/12/2017 18:15  Ct Cervical Spine Wo Contrast  Result Date: 11/12/2017 CLINICAL DATA:  Unwitnessed fall. EXAM: CT HEAD WITHOUT CONTRAST CT CERVICAL SPINE WITHOUT CONTRAST TECHNIQUE: Multidetector CT imaging of the head and cervical spine was performed following the standard protocol without intravenous contrast. Multiplanar CT image reconstructions of the cervical spine were also generated. COMPARISON:  None. FINDINGS: CT HEAD FINDINGS Brain: Mild chronic ischemic white matter disease is noted. No mass effect or midline shift is noted. Ventricular size is within normal limits. There is no evidence of mass lesion, hemorrhage or acute infarction. Vascular: No hyperdense vessel or unexpected calcification. Skull: Normal. Negative for fracture or focal lesion. Sinuses/Orbits: No acute finding. Other: None. CT CERVICAL SPINE FINDINGS Alignment: Mild reversal of normal lordosis is noted most likely due degenerative change. Skull base and vertebrae: No acute fracture. No primary bone lesion or focal pathologic process. Soft tissues and spinal canal: No prevertebral fluid or swelling. No visible canal hematoma. Disc levels: Severe degenerative disc disease is noted at C3-4, C4-5, C5-6, C6-7 and C7-T1. Upper chest: Negative. Other: Mild degenerative changes are seen involving posterior facet joints  bilaterally. IMPRESSION: Mild chronic ischemic white matter disease. No acute intracranial abnormality seen. Severe multilevel degenerative disc disease. No acute abnormality seen in the cervical spine. Electronically Signed   By: Marijo Conception, M.D.   On: 11/12/2017 18:15   Chest Portable 1 View  Result Date: 11/12/2017 CLINICAL DATA:  Preop EXAM: PORTABLE CHEST 1 VIEW COMPARISON:  Radiographs 11/12/2017, chest x-ray 09/04/2017, 04/26/2016. FINDINGS: Right azygos lobe. Stable elevation of left diaphragm with minimal basilar atelectasis. Mild cardiomegaly with aortic atherosclerosis. No pneumothorax. Multiple old left upper rib fractures. IMPRESSION: Elevation of the left diaphragm as before with patchy basilar atelectasis. Cardiomegaly without edema or focal pulmonary infiltrate Electronically Signed   By: Donavan Foil M.D.   On: 11/12/2017 20:57   Dg Hips Bilat W Or Wo Pelvis 3-4 Views  Result Date: 11/12/2017 CLINICAL DATA:  Unwitnessed fall EXAM: DG HIP (WITH OR WITHOUT PELVIS) 3-4V BILAT COMPARISON:  CT 09/04/2017 FINDINGS: Surgical hardware at the lumbar spine. SI joints are not widened. Vascular calcifications. Slightly limited positioning of the left hip with trochanters superimposed over the femoral neck. No obvious fracture is seen. Acute right femoral neck fracture with superior migration of the distal femur. No femoral head dislocation. Extensive vascular calcifications. IMPRESSION: 1. Acute right femoral neck fracture.  No femoral head dislocation 2. No definite acute osseous abnormality of the left hip Electronically Signed   By: Donavan Foil M.D.   On: 11/12/2017 18:32    EKG: Independently reviewed.  Assessment/Plan Principal Problem:   Closed displaced fracture of right femoral neck (HCC) Active Problems:   Essential hypertension   MDS (myelodysplastic syndrome) (HCC)   Hyponatremia   Severe dementia    1. R femoral neck fx - 1. R hemiarthroplasty on Thurs 2. Hip fx  pathway 3. Will let patient eat until [place] Thurs AM 4. Lovenox x 1 dose for tonight then SCDs until surgery 5. Pain ctrl per pathway 6. EKG pending 2. Hyponatremia - 1. Acute on chronic 2. Also had trouble with this during stay in October 3. NS: 1L in ED and 125 cc/hr 4. Repeat BMP in AM 5. Checking UA, urine sodium, urine osm 3. HTN - continue home meds 4. MDS - causing chronic transfusion dependent anemia 1. Type and screen 2. Just got transfusion this past week for anemia 3. Doesn't seem to have affected WBC nor platelets at this  time 5. Severe dementia - parkinson's disease 1. Continue sinemet 2. Chronic and baseline severe   DVT prophylaxis: Lovenox x 1 dose, then SCDs until surgery Code Status: Full Family Communication: Family at bedside Disposition Plan: SNF after admit Consults called: Ortho Admission status: Admit to inpatient   [place], [place] [facility] Pager [phone]  If 7AM-7PM, please contact day team taking care of patient [email or web address] Password [facility]  11/12/2017, 9:16 PM

## 2017-11-12 NOTE — ED Notes (Signed)
RN notified of abnormal lab 

## 2017-11-12 NOTE — Consult Note (Signed)
ORTHOPAEDIC CONSULTATION  REQUESTING PHYSICIAN: Alexander Quill, DO  PCP:  Alexander Olp, MD  Chief Complaint: Right hip pain.  HPI: Alexander Rice is a 83 y.o. male who complains of right hip pain.  Alexander Rice has marked dementia and Parkinson's disorder at baseline.  He lives with his wife in an independent living apartment.  He does have a nurse's aide that visits the home 3 times a day for various hygiene care.  Per his Alexander Rice and son who help provide the history given his profound dementia, they state that he potentially fell on Monday morning early.  He was helped up to his chair by EMS but not brought in for any evaluation.  Since that time he has began complaining of right hip pain.  There is no suspected subsequent fall, however this is felt to be related to the initial fall.  He does not walk very much at baseline but does occasionally get up with the aid of a walker.  He is overall very sedentary.  His family feels as though he may be in need of a nursing facility following this admission.  Due to the patient's dementia he does not provide any history.  Past Medical History:  Diagnosis Date  . Adenomatous colon polyp 02/1996, 02/2011   TA polyp 02/2011  . Anemia in chronic renal disease 11/02/2011  . B12 deficiency   . BPH (benign prostatic hyperplasia)   . CAD (coronary artery disease)   . CVD (cardiovascular disease)   . Dementia   . Diverticulosis   . Esophageal stricture   . GERD (gastroesophageal reflux disease)   . Glaucoma   . Heart palpitations   . Hemorrhoids   . Hiatal hernia   . History of colonic polyps 09/29/2006   Polyps age 48, Dr. Fuller Plan stated no further colonoscopy due to age. Confirmed with Alexander Rice given alzheimers, CAD history    . Hyperlipidemia   . Hypertension   . MDS (myelodysplastic syndrome) ([place])    Past Surgical History:  Procedure Laterality Date  . CAROTID ENDARTERECTOMY    . CHOLECYSTECTOMY N/A 09/07/2017   Procedure:  LAPAROSCOPIC CHOLECYSTECTOMY WITH INTRAOPERATIVE CHOLANGIOGRAM, REPAIR OF CHOLECYSTODUODENAL FISTULA, UPPER ENDOSCOPY;  Surgeon: Alphonsa Overall, MD;  Location: [facility];  Service: General;  Laterality: N/A;  . COLONOSCOPY    . ERCP N/A 09/06/2017   Procedure: ENDOSCOPIC RETROGRADE CHOLANGIOPANCREATOGRAPHY (ERCP);  Surgeon: Milus Banister, MD;  Location: [facility];  Service: Endoscopy;  Laterality: N/A;  . GLAUCOMA SURGERY Bilateral 11/19/12   pt's report  . INGUINAL HERNIA REPAIR     right side/ twice  . PTCA     stent  . QUADRICEPS REPAIR     muscle attachment  . TONSILLECTOMY     Social History   Socioeconomic History  . Marital status: Married    Spouse name: Not on file  . Number of children: 3  . Years of education: Not on file  . Highest education level: Not on file  Social Needs  . Financial resource strain: Not on file  . Food insecurity - worry: Not on file  . Food insecurity - inability: Not on file  . Transportation needs - medical: Not on file  . Transportation needs - non-medical: Not on file  Occupational History  . Occupation: Retired    Fish farm manager: RETIRED  Tobacco Use  . Smoking status: Former Smoker    Last attempt to quit: 11/19/1968    Years since quitting: 49.0  . Smokeless  tobacco: Never Used  . Tobacco comment: never used tobacco  Substance and Sexual Activity  . Alcohol use: Yes    Alcohol/week: 4.2 oz    Types: 7 Standard drinks or equivalent per week    Comment: Wine/Beer  . Drug use: No  . Sexual activity: Not on file  Other Topics Concern  . Not on file  Social History Narrative   Married (wife patient of Dr. Yong Channel as well)      Retired from Armed forces logistics/support/administrative officer and [place]      Hobbies: Scientist, research (physical sciences) [employer], [employer] .   Family History  Problem Relation Age of Onset  . COPD Mother   . Alcohol abuse Father    No Known Allergies Prior to Admission medications   Medication Sig Start Date End Date Taking? Authorizing  Provider  aspirin 81 MG tablet Take 81 mg by mouth daily.     Yes [provider]  carbidopa-levodopa (SINEMET IR) 25-100 MG tablet Take 1/2 tablet three times a day Patient taking differently: Take 0.5 tablets by mouth 3 (three) times daily. Take 1/2 tablet three times a day 07/08/17  Yes Cameron Sprang, MD  [facility] OMEGA-3 KRILL OIL PO Take 350 mg by mouth daily. Reported on 01/16/2016   Yes [provider]  cyanocobalamin 100 MCG tablet Take 100 mcg by mouth daily.    Yes [provider]  docusate sodium (COLACE) 100 MG capsule Take 1 capsule (100 mg total) by mouth 2 (two) times daily. 09/12/17  Yes Hosie Poisson, MD  dorzolamide (TRUSOPT) 2 % ophthalmic solution Place 1 drop into both eyes 2 (two) times daily.     Yes [provider]  doxazosin (CARDURA) 4 MG tablet TAKE 1 TABLET BY MOUTH AT  BEDTIME 10/01/17  Yes Alexander Olp, MD  lisinopril (PRINIVIL,ZESTRIL) 5 MG tablet TAKE 1 TABLET BY MOUTH  DAILY 05/14/17  Yes Alexander Olp, MD  LUMIGAN 0.01 % SOLN Place 1 drop into both eyes at bedtime.  03/23/11  Yes [provider]  memantine (NAMENDA) 10 MG tablet TAKE 1 TABLET BY MOUTH TWO  TIMES DAILY 05/01/17  Yes Cameron Sprang, MD  Multiple Vitamin (MULTIVITAMIN) tablet Take 1 tablet by mouth daily.     Yes [provider]  omeprazole (PRILOSEC) 20 MG capsule TAKE 1 CAPSULE BY MOUTH  DAILY 03/26/17  Yes Alexander Olp, MD  Probiotic Product ([employer] PROBIOTIC) CHEW Chew 1 tablet by mouth daily.    Yes [provider]  Pyridoxine HCl (VITAMIN B-6) 250 MG tablet Take 1 tablet (250 mg total) by mouth daily. 05/17/15  Yes [name], MD  simvastatin (ZOCOR) 40 MG tablet Hold for 2 to 4 weeks and recheck liver function tests at that time and if stable, can be restarted as per discretion of the PCP. Patient taking differently: Take 40 mg by mouth at bedtime.  09/12/17  Yes Hosie Poisson, MD  epoetin alfa (PROCRIT) 57846 UNIT/ML  injection Inject 1 mL (40,000 Units total) into the skin every 21 ( twenty-one) days. As needed for Hgb < 10g/dl 09/13/17   [place], Holli Humbles, NP   Dg Lumbar Spine Complete  Result Date: 11/12/2017 CLINICAL DATA:  Unwitnessed fall EXAM: LUMBAR SPINE - COMPLETE 4+ VIEW COMPARISON:  CT 09/04/2017 FINDINGS: Mild scoliosis of the lumbar spine, apex left. Extensive atherosclerotic vascular disease of the aorta and branch vessels with calcified aneurysm of the mid to distal abdominal aorta, measuring up to 4.7 cm  anterior to posterior. Vertebral body heights appear maintained. Multilevel moderate to marked degenerative disc changes. Surgical hardware posterior to L4-L5. IMPRESSION: 1. Scoliosis of the lumbar spine with advanced degenerative change. No acute osseous abnormality 2. Calcified abdominal aortic aneurysm, measures up to 4.8 cm maximum AP, compared with 35 mm on the comparison CT ; follow-up CTA would more accurately characterize the aneurysm and assess for true interval change in size Electronically Signed   By: Donavan Foil M.D.   On: 11/12/2017 18:38   Ct Head Wo Contrast  Result Date: 11/12/2017 CLINICAL DATA:  Unwitnessed fall. EXAM: CT HEAD WITHOUT CONTRAST CT CERVICAL SPINE WITHOUT CONTRAST TECHNIQUE: Multidetector CT imaging of the head and cervical spine was performed following the standard protocol without intravenous contrast. Multiplanar CT image reconstructions of the cervical spine were also generated. COMPARISON:  None. FINDINGS: CT HEAD FINDINGS Brain: Mild chronic ischemic white matter disease is noted. No mass effect or midline shift is noted. Ventricular size is within normal limits. There is no evidence of mass lesion, hemorrhage or acute infarction. Vascular: No hyperdense vessel or unexpected calcification. Skull: Normal. Negative for fracture or focal lesion. Sinuses/Orbits: No acute finding. Other: None. CT CERVICAL SPINE FINDINGS Alignment: Mild reversal of normal lordosis  is noted most likely due degenerative change. Skull base and vertebrae: No acute fracture. No primary bone lesion or focal pathologic process. Soft tissues and spinal canal: No prevertebral fluid or swelling. No visible canal hematoma. Disc levels: Severe degenerative disc disease is noted at C3-4, C4-5, C5-6, C6-7 and C7-T1. Upper chest: Negative. Other: Mild degenerative changes are seen involving posterior facet joints bilaterally. IMPRESSION: Mild chronic ischemic white matter disease. No acute intracranial abnormality seen. Severe multilevel degenerative disc disease. No acute abnormality seen in the cervical spine. Electronically Signed   By: Marijo Conception, M.D.   On: 11/12/2017 18:15   Ct Cervical Spine Wo Contrast  Result Date: 11/12/2017 CLINICAL DATA:  Unwitnessed fall. EXAM: CT HEAD WITHOUT CONTRAST CT CERVICAL SPINE WITHOUT CONTRAST TECHNIQUE: Multidetector CT imaging of the head and cervical spine was performed following the standard protocol without intravenous contrast. Multiplanar CT image reconstructions of the cervical spine were also generated. COMPARISON:  None. FINDINGS: CT HEAD FINDINGS Brain: Mild chronic ischemic white matter disease is noted. No mass effect or midline shift is noted. Ventricular size is within normal limits. There is no evidence of mass lesion, hemorrhage or acute infarction. Vascular: No hyperdense vessel or unexpected calcification. Skull: Normal. Negative for fracture or focal lesion. Sinuses/Orbits: No acute finding. Other: None. CT CERVICAL SPINE FINDINGS Alignment: Mild reversal of normal lordosis is noted most likely due degenerative change. Skull base and vertebrae: No acute fracture. No primary bone lesion or focal pathologic process. Soft tissues and spinal canal: No prevertebral fluid or swelling. No visible canal hematoma. Disc levels: Severe degenerative disc disease is noted at C3-4, C4-5, C5-6, C6-7 and C7-T1. Upper chest: Negative. Other: Mild  degenerative changes are seen involving posterior facet joints bilaterally. IMPRESSION: Mild chronic ischemic white matter disease. No acute intracranial abnormality seen. Severe multilevel degenerative disc disease. No acute abnormality seen in the cervical spine. Electronically Signed   By: Marijo Conception, M.D.   On: 11/12/2017 18:15   Dg Hips Bilat W Or Wo Pelvis 3-4 Views  Result Date: 11/12/2017 CLINICAL DATA:  Unwitnessed fall EXAM: DG HIP (WITH OR WITHOUT PELVIS) 3-4V BILAT COMPARISON:  CT 09/04/2017 FINDINGS: Surgical hardware at the lumbar spine. SI joints are not widened. Vascular calcifications.  Slightly limited positioning of the left hip with trochanters superimposed over the femoral neck. No obvious fracture is seen. Acute right femoral neck fracture with superior migration of the distal femur. No femoral head dislocation. Extensive vascular calcifications. IMPRESSION: 1. Acute right femoral neck fracture.  No femoral head dislocation 2. No definite acute osseous abnormality of the left hip Electronically Signed   By: Donavan Foil M.D.   On: 11/12/2017 18:32    Positive ROS: All other systems have been reviewed and were otherwise negative with the exception of those mentioned in the HPI and as above.  Physical Exam: General: Not alert, no acute distress Cardiovascular: No pedal edema Respiratory: No cyanosis, no use of accessory musculature GI: No organomegaly, abdomen is soft and non-tender Skin: No lesions in the area of chief complaint Neurologic: Sensation intact distally Psychiatric: Patient is is not competent Lymphatic: No axillary or cervical lymphadenopathy  MUSCULOSKELETAL:  Right lower extremity is held shortened and external rotation.  He moves spontaneously at the foot and ankle.  He has calf that are soft and nontender.  He has a 2+ dorsalis pedis pulse.  He responds to light touch.  No focal exam secondary to mental status.  Assessment: Closed right femoral neck  fracture with displacement.  Plan: -Due to the displaced nature of this femoral neck fracture he would need operative management with arthroplasty.  He is a low demand low functioning adult and would benefit from hemiarthroplasty.  I have discussed this with my partner Dr. Lyla Glassing who will add the patient onto his surgical schedule on Thursday for right hip hemiarthroplasty. - The risks, benefits, and alternatives were discussed with the patient's children at the bedside. There are risks associated with the surgery including, but not limited to, problems with anesthesia (death), infection, differences in leg length/angulation/rotation, fracture of bones, loosening or failure of implants, malunion, nonunion, hematoma (blood accumulation) which may require surgical drainage, blood clots, pulmonary embolism, nerve injury (foot drop), and blood vessel injury. The healthcare powers of attorney understand these risks and elects to proceed. -Please hold chemical DVT prophylaxis after tomorrow a.m.  He will need to be n.p.o. at midnight Wednesday night.     Nicholes Stairs, MD Cell [phone]    11/12/2017 8:57 PM

## 2017-11-13 ENCOUNTER — Encounter (HOSPITAL_COMMUNITY): Payer: Self-pay | Admitting: Anesthesiology

## 2017-11-13 DIAGNOSIS — G20 Parkinson's disease: Secondary | ICD-10-CM

## 2017-11-13 DIAGNOSIS — F0280 Dementia in other diseases classified elsewhere without behavioral disturbance: Secondary | ICD-10-CM

## 2017-11-13 DIAGNOSIS — D649 Anemia, unspecified: Secondary | ICD-10-CM

## 2017-11-13 LAB — BASIC METABOLIC PANEL
Anion gap: 6 (ref 5–15)
BUN: 17 mg/dL (ref 6–20)
CHLORIDE: 95 mmol/L — AB (ref 101–111)
CO2: 19 mmol/L — ABNORMAL LOW (ref 22–32)
CREATININE: 0.77 mg/dL (ref 0.61–1.24)
Calcium: 7.7 mg/dL — ABNORMAL LOW (ref 8.9–10.3)
GFR calc Af Amer: >60 mL/min (ref >60)
GFR calc non Af Amer: >60 mL/min (ref >60)
GLUCOSE: 105 mg/dL — AB (ref 65–99)
POTASSIUM: 4.2 mmol/L (ref 3.5–5.1)
Sodium: 120 mmol/L — ABNORMAL LOW (ref 135–145)

## 2017-11-13 LAB — URINALYSIS, ROUTINE W REFLEX MICROSCOPIC
BILIRUBIN URINE: NEGATIVE
GLUCOSE, UA: NEGATIVE mg/dL
Hgb urine dipstick: NEGATIVE
KETONES UR: 5 mg/dL — AB
Leukocytes, UA: NEGATIVE
NITRITE: NEGATIVE
PH: 6 (ref 5.0–8.0)
PROTEIN: NEGATIVE mg/dL
Specific Gravity, Urine: 1.015 (ref 1.005–1.030)

## 2017-11-13 LAB — SODIUM, URINE, RANDOM: Sodium, Ur: 48 mmol/L

## 2017-11-13 LAB — SURGICAL PCR SCREEN
MRSA, PCR: NEGATIVE
Staphylococcus aureus: NEGATIVE

## 2017-11-13 LAB — OSMOLALITY, URINE: Osmolality, Ur: 521 mOsm/kg (ref 300–900)

## 2017-11-13 NOTE — Progress Notes (Signed)
Nutrition Brief Note  Consult per Hip Fracture Protocol.  Wt Readings from Last 15 Encounters:  11/12/17 165 lb 2 oz (74.9 kg)  09/06/17 169 lb (76.7 kg)  07/08/17 170 lb (77.1 kg)  06/17/17 169 lb (76.7 kg)  05/08/17 168 lb (76.2 kg)  04/22/17 168 lb (76.2 kg)  04/19/17 166 lb 6.4 oz (75.5 kg)  04/16/17 168 lb 12.8 oz (76.6 kg)  03/25/17 169 lb 12.8 oz (77 kg)  03/04/17 170 lb 6.4 oz (77.3 kg)  02/11/17 164 lb 1.3 oz (74.4 kg)  01/31/17 165 lb 6.4 oz (75 kg)  01/18/17 164 lb (74.4 kg)  12/28/16 164 lb 12 oz (74.7 kg)  12/03/16 169 lb 12.8 oz (77 kg)    Body mass index is 25.86 kg/m. Patient meets criteria for overweight (appropriate for age) based on current BMI. Weight stable x1 year. Skin WDL. Pt with hx which includes Parkinson's and dementia and is noted to be a/o to self only. Pt experienced a fall on 12/24 and was found to have R hip fx pending surgery 12/27.  Current diet order is Regular with no intakes documented. To be changed to NPO at midnight. Medications reviewed; daily multivitamin with minerals, 250 mg vitamin B6/day, 100 mcg oral vitamin B12/day.  Labs reviewed; Na: 120 mmol/L, Cl: 95 mmol/L, Ca: 7.7 mg/dL.  IVF: NS @ 125 mL/hr.  No nutrition interventions warranted at this time. If nutrition issues arise, please consult RD.     Jarome Matin, MS, RD, LDN, [facility] Inpatient Clinical Dietitian Pager # [phone] After hours/weekend pager # [phone]

## 2017-11-13 NOTE — Progress Notes (Signed)
PROGRESS NOTE  Alexander Rice JSE:[id number] DOB: 07/04/1932 DOA: 11/12/2017 PCP: Marin Olp, MD  HPI/Recap of past 24 hours:  Alexander Rice is a 83 y.o. male with medical history significant of Parkinson's disease, advanced dementia, MDS, HTN.  Patient presents to the ED post fall on 11/11/17, helped to chair by EMS but not brought in to ED for eval.  Since that time has had severe R hip pain.  Doesn't walk very much at baseline but occasionally gets up with aid of walker.  Brought in to ED for severe R hip pain, worse with movement, constant, and persistent. Right femoral neck fracture on xray. Orthopedic surgery contacted with plan for right hip hemiarthroplasty 11/14/17  Pt seen and examined with no family member at his bedside. He is confused in the setting of advanced dementia and does not answer questions appropriately.    Assessment/Plan: Principal Problem:   Closed displaced fracture of right femoral neck (HCC) Active Problems:   Essential hypertension   MDS (myelodysplastic syndrome) (HCC)   Hyponatremia   Severe dementia  1. R femoral neck fx - 1. R hemiarthroplasty 11/14/17 2. Hip fx pathway 3. Npo after midnight 2. Hyponatremia - possibly from pain vs others 1. Acute  2. Na+ 120 from 118 1 day ago. Na+ 134 (8 days ago) 3. Monitor urine output 4. Fluid restriction 5. BMP am 3. HTN - continue home meds 4. MDS - causing chronic transfusion dependent anemia 1. Type and screen 2. Hg 8.4 mcv 92 3. CBC am 5. Severe dementia - parkinson's disease 1. Continue sinemet 2. Chronic and baseline severe  6. Ambulatory dysfunction-fall precaution -PT per surgery post surgery-will require SNF for rehab   DVT prophylaxis: SCDs Code Status: Full Family Communication: No family member at bedside Disposition Plan: Planned surgery tomorrow 11/14/17 Consults called: Orthopedic surgery   Objective: Vitals:   11/12/17 2213 11/13/17 0145 11/13/17 0515  11/13/17 1439  BP:   (!) 155/47 (!) 153/50  Pulse:  74 75 77  Resp:  16 17 16   Temp:   (!) 100.5 F (38.1 C) 97.9 F (36.6 C)  TempSrc:   Oral Oral  SpO2:  95% 95% 98%  Weight: 74.9 kg (165 lb 2 oz)     Height: 5\' 7"  (1.702 m)       Intake/Output Summary (Last 24 hours) at 11/13/2017 1444 Last data filed at 11/13/2017 1300 Gross per 24 hour  Intake 2415 ml  Output 700 ml  Net 1715 ml   Filed Weights   11/12/17 2213  Weight: 74.9 kg (165 lb 2 oz)    Exam:  Constitutional: Alert but confused and does not respond to questions appropriately Eyes: PERRL, lids and conjunctivae normal ENMT: Mucous membranes are moist. Posterior pharynx clear of any exudate or lesions.Normal dentition.  Neck: normal, supple, no masses, no thyromegaly Respiratory: clear to auscultation bilaterally, no wheezing, no crackles. Normal respiratory effort. No accessory muscle use.  Cardiovascular: Regular rate and rhythm, no murmurs / rubs / gallops. No extremity edema. 2+ pedal pulses. No carotid bruits.  Abdomen: no tenderness, no masses palpated. No hepatosplenomegaly. Bowel sounds positive.  Musculoskeletal: trace edema bilaterally. Right hip mildly edematous Skin: no rashes, lesions, ulcers. No induration Neurologic: unable to assess. does not follow commands  Psychiatric: unable to assess due to alltered mental status   Data Reviewed: CBC: Recent Labs  Lab 11/12/17 1820  WBC 9.9  NEUTROABS 8.1*  HGB 8.4*  HCT 23.5*  MCV 92.2  PLT 299   Basic Metabolic Panel: Recent Labs  Lab 11/12/17 1820 11/13/17 0418  NA 118* 120*  K 4.6 4.2  CL 91* 95*  CO2 22 19*  GLUCOSE 109* 105*  BUN 16 17  CREATININE 0.75 0.77  CALCIUM 8.0* 7.7*   GFR: Estimated Creatinine Clearance: 65.4 mL/min (by C-G formula based on SCr of 0.77 mg/dL). Liver Function Tests: No results for input(s): AST, ALT, ALKPHOS, BILITOT, PROT, ALBUMIN in the last 168 hours. No results for input(s): LIPASE, AMYLASE in the  last 168 hours. No results for input(s): AMMONIA in the last 168 hours. Coagulation Profile: Recent Labs  Lab 11/12/17 1910  INR 1.22   Cardiac Enzymes: No results for input(s): CKTOTAL, CKMB, CKMBINDEX, TROPONINI in the last 168 hours. BNP (last 3 results) No results for input(s): PROBNP in the last 8760 hours. HbA1C: No results for input(s): HGBA1C in the last 72 hours. CBG: No results for input(s): GLUCAP in the last 168 hours. Lipid Profile: No results for input(s): CHOL, HDL, LDLCALC, TRIG, CHOLHDL, LDLDIRECT in the last 72 hours. Thyroid Function Tests: No results for input(s): TSH, T4TOTAL, FREET4, T3FREE, THYROIDAB in the last 72 hours. Anemia Panel: No results for input(s): VITAMINB12, FOLATE, FERRITIN, TIBC, IRON, RETICCTPCT in the last 72 hours. Urine analysis:    Component Value Date/Time   COLORURINE YELLOW 11/13/2017 0240   APPEARANCEUR CLEAR 11/13/2017 0240   LABSPEC 1.015 11/13/2017 0240   PHURINE 6.0 11/13/2017 0240   GLUCOSEU NEGATIVE 11/13/2017 0240   HGBUR NEGATIVE 11/13/2017 0240   HGBUR negative 10/11/2009 0819   BILIRUBINUR NEGATIVE 11/13/2017 0240   BILIRUBINUR n 04/18/2016 1152   KETONESUR 5 (A) 11/13/2017 0240   PROTEINUR NEGATIVE 11/13/2017 0240   UROBILINOGEN 1.0 04/18/2016 1152   UROBILINOGEN 0.2 10/11/2009 0819   NITRITE NEGATIVE 11/13/2017 0240   LEUKOCYTESUR NEGATIVE 11/13/2017 0240   Sepsis Labs: @LABRCNTIP (procalcitonin:4,lacticidven:4)  ) Recent Results (from the past 240 hour(s))  Surgical PCR screen     Status: None   Collection Time: 11/12/17 11:20 PM  Result Value Ref Range Status   MRSA, PCR NEGATIVE NEGATIVE Final   Staphylococcus aureus NEGATIVE NEGATIVE Final    Comment: (NOTE) The Xpert SA Assay (FDA approved for NASAL specimens in patients 20 years of age and older), is one component of a comprehensive surveillance program. It is not intended to diagnose infection nor to guide or monitor treatment.        Studies: Dg Lumbar Spine Complete  Result Date: 11/12/2017 CLINICAL DATA:  Unwitnessed fall EXAM: LUMBAR SPINE - COMPLETE 4+ VIEW COMPARISON:  CT 09/04/2017 FINDINGS: Mild scoliosis of the lumbar spine, apex left. Extensive atherosclerotic vascular disease of the aorta and branch vessels with calcified aneurysm of the mid to distal abdominal aorta, measuring up to 4.7 cm anterior to posterior. Vertebral body heights appear maintained. Multilevel moderate to marked degenerative disc changes. Surgical hardware posterior to L4-L5. IMPRESSION: 1. Scoliosis of the lumbar spine with advanced degenerative change. No acute osseous abnormality 2. Calcified abdominal aortic aneurysm, measures up to 4.8 cm maximum AP, compared with 35 mm on the comparison CT ; follow-up CTA would more accurately characterize the aneurysm and assess for true interval change in size Electronically Signed   By: Donavan Foil M.D.   On: 11/12/2017 18:38   Ct Head Wo Contrast  Result Date: 11/12/2017 CLINICAL DATA:  Unwitnessed fall. EXAM: CT HEAD WITHOUT CONTRAST CT CERVICAL SPINE WITHOUT CONTRAST TECHNIQUE: Multidetector CT imaging of the head and cervical spine was  performed following the standard protocol without intravenous contrast. Multiplanar CT image reconstructions of the cervical spine were also generated. COMPARISON:  None. FINDINGS: CT HEAD FINDINGS Brain: Mild chronic ischemic white matter disease is noted. No mass effect or midline shift is noted. Ventricular size is within normal limits. There is no evidence of mass lesion, hemorrhage or acute infarction. Vascular: No hyperdense vessel or unexpected calcification. Skull: Normal. Negative for fracture or focal lesion. Sinuses/Orbits: No acute finding. Other: None. CT CERVICAL SPINE FINDINGS Alignment: Mild reversal of normal lordosis is noted most likely due degenerative change. Skull base and vertebrae: No acute fracture. No primary bone lesion or focal pathologic  process. Soft tissues and spinal canal: No prevertebral fluid or swelling. No visible canal hematoma. Disc levels: Severe degenerative disc disease is noted at C3-4, C4-5, C5-6, C6-7 and C7-T1. Upper chest: Negative. Other: Mild degenerative changes are seen involving posterior facet joints bilaterally. IMPRESSION: Mild chronic ischemic white matter disease. No acute intracranial abnormality seen. Severe multilevel degenerative disc disease. No acute abnormality seen in the cervical spine. Electronically Signed   By: Marijo Conception, M.D.   On: 11/12/2017 18:15   Ct Cervical Spine Wo Contrast  Result Date: 11/12/2017 CLINICAL DATA:  Unwitnessed fall. EXAM: CT HEAD WITHOUT CONTRAST CT CERVICAL SPINE WITHOUT CONTRAST TECHNIQUE: Multidetector CT imaging of the head and cervical spine was performed following the standard protocol without intravenous contrast. Multiplanar CT image reconstructions of the cervical spine were also generated. COMPARISON:  None. FINDINGS: CT HEAD FINDINGS Brain: Mild chronic ischemic white matter disease is noted. No mass effect or midline shift is noted. Ventricular size is within normal limits. There is no evidence of mass lesion, hemorrhage or acute infarction. Vascular: No hyperdense vessel or unexpected calcification. Skull: Normal. Negative for fracture or focal lesion. Sinuses/Orbits: No acute finding. Other: None. CT CERVICAL SPINE FINDINGS Alignment: Mild reversal of normal lordosis is noted most likely due degenerative change. Skull base and vertebrae: No acute fracture. No primary bone lesion or focal pathologic process. Soft tissues and spinal canal: No prevertebral fluid or swelling. No visible canal hematoma. Disc levels: Severe degenerative disc disease is noted at C3-4, C4-5, C5-6, C6-7 and C7-T1. Upper chest: Negative. Other: Mild degenerative changes are seen involving posterior facet joints bilaterally. IMPRESSION: Mild chronic ischemic white matter disease. No acute  intracranial abnormality seen. Severe multilevel degenerative disc disease. No acute abnormality seen in the cervical spine. Electronically Signed   By: Marijo Conception, M.D.   On: 11/12/2017 18:15   Chest Portable 1 View  Result Date: 11/12/2017 CLINICAL DATA:  Preop EXAM: PORTABLE CHEST 1 VIEW COMPARISON:  Radiographs 11/12/2017, chest x-ray 09/04/2017, 04/26/2016. FINDINGS: Right azygos lobe. Stable elevation of left diaphragm with minimal basilar atelectasis. Mild cardiomegaly with aortic atherosclerosis. No pneumothorax. Multiple old left upper rib fractures. IMPRESSION: Elevation of the left diaphragm as before with patchy basilar atelectasis. Cardiomegaly without edema or focal pulmonary infiltrate Electronically Signed   By: Donavan Foil M.D.   On: 11/12/2017 20:57   Dg Hips Bilat W Or Wo Pelvis 3-4 Views  Result Date: 11/12/2017 CLINICAL DATA:  Unwitnessed fall EXAM: DG HIP (WITH OR WITHOUT PELVIS) 3-4V BILAT COMPARISON:  CT 09/04/2017 FINDINGS: Surgical hardware at the lumbar spine. SI joints are not widened. Vascular calcifications. Slightly limited positioning of the left hip with trochanters superimposed over the femoral neck. No obvious fracture is seen. Acute right femoral neck fracture with superior migration of the distal femur. No femoral head dislocation. Extensive  vascular calcifications. IMPRESSION: 1. Acute right femoral neck fracture.  No femoral head dislocation 2. No definite acute osseous abnormality of the left hip Electronically Signed   By: Donavan Foil M.D.   On: 11/12/2017 18:32    Scheduled Meds: . acidophilus  1 capsule Oral Daily  . carbidopa-levodopa  0.5 tablet Oral TID  . docusate sodium  100 mg Oral BID  . dorzolamide  1 drop Both Eyes BID  . doxazosin  4 mg Oral QHS  . latanoprost  1 drop Both Eyes QHS  . lisinopril  5 mg Oral Daily  . memantine  10 mg Oral BID  . multivitamin with minerals  1 tablet Oral Daily  . pantoprazole  40 mg Oral Daily  .  vitamin B-6  250 mg Oral Daily  . simvastatin  40 mg Oral QHS  . cyanocobalamin  100 mcg Oral Daily    Continuous Infusions: . sodium chloride 125 mL/hr at 11/13/17 0655     LOS: 1 day     Kayleen Memos, MD [facility] Pager [phone]  If 7PM-7AM, please contact night-coverage [email or web address] Password [facility] 11/13/2017, 2:44 PM

## 2017-11-14 ENCOUNTER — Encounter (HOSPITAL_COMMUNITY)
Admission: EM | Disposition: A | Discharge status: Skilled Nursing Facility | Payer: Self-pay | Source: Home / Self Care | Attending: Internal Medicine

## 2017-11-14 ENCOUNTER — Encounter (HOSPITAL_COMMUNITY): Payer: Self-pay | Admitting: *Deleted

## 2017-11-14 LAB — BASIC METABOLIC PANEL
ANION GAP: 4 — AB (ref 5–15)
BUN: 15 mg/dL (ref 6–20)
CALCIUM: 7.6 mg/dL — AB (ref 8.9–10.3)
CO2: 19 mmol/L — ABNORMAL LOW (ref 22–32)
Chloride: 101 mmol/L (ref 101–111)
Creatinine, Ser: 0.8 mg/dL (ref 0.61–1.24)
Glucose, Bld: 96 mg/dL (ref 65–99)
Potassium: 3.8 mmol/L (ref 3.5–5.1)
SODIUM: 124 mmol/L — AB (ref 135–145)

## 2017-11-14 LAB — PREPARE RBC (CROSSMATCH)

## 2017-11-14 SURGERY — ARTHROPLASTY, HIP, TOTAL, ANTERIOR APPROACH
Anesthesia: Choice | Laterality: Right

## 2017-11-14 MED ORDER — CHLORHEXIDINE GLUCONATE 4 % EX LIQD
60.0000 mL | Freq: Once | CUTANEOUS | Status: AC
  Administered 2017-11-16: 4 via TOPICAL
  Filled 2017-11-14: qty 60

## 2017-11-14 MED ORDER — CEFAZOLIN SODIUM-DEXTROSE 2-4 GM/100ML-% IV SOLN: 2.0000 g | INTRAVENOUS | Status: AC

## 2017-11-14 MED ORDER — LACTATED RINGERS IV SOLN
INTRAVENOUS | Status: DC
  Administered 2017-11-14: 17:00:00 via INTRAVENOUS

## 2017-11-14 MED ORDER — SODIUM CHLORIDE 0.9 % IV SOLN: Freq: Once | INTRAVENOUS | Status: DC

## 2017-11-14 MED ORDER — TRANEXAMIC ACID 1000 MG/10ML IV SOLN
1000.0000 mg | INTRAVENOUS | Status: AC
  Filled 2017-11-14 (×2): qty 10

## 2017-11-14 MED ORDER — CEFAZOLIN SODIUM-DEXTROSE 2-4 GM/100ML-% IV SOLN
INTRAVENOUS | Status: AC
  Filled 2017-11-14: qty 100

## 2017-11-14 MED ORDER — CHLORHEXIDINE GLUCONATE 4 % EX LIQD
60.0000 mL | Freq: Once | CUTANEOUS | Status: AC
  Administered 2017-11-14: 4 via TOPICAL
  Filled 2017-11-14: qty 60

## 2017-11-14 MED ORDER — POVIDONE-IODINE 10 % EX SWAB: 2.0000 "application " | Freq: Once | CUTANEOUS | Status: DC

## 2017-11-14 MED ORDER — POVIDONE-IODINE 10 % EX SWAB
2.0000 "application " | Freq: Once | CUTANEOUS | Status: AC
  Administered 2017-11-14: 2 via TOPICAL

## 2017-11-14 MED ORDER — CEFAZOLIN SODIUM-DEXTROSE 2-4 GM/100ML-% IV SOLN
2.0000 g | INTRAVENOUS | Status: AC
  Administered 2017-11-16: 2 g via INTRAVENOUS
  Filled 2017-11-14: qty 100

## 2017-11-14 NOTE — Telephone Encounter (Signed)
Spoke to daughter Alexander Rice who states her dad fell on Christmas Eve and has a fractured hip. Patient is about ot go into surgery.They are looking to place patient as his care is becoming extensive. She states her dad got out of the bed on his own without the walker and fell at 4am.

## 2017-11-14 NOTE — Telephone Encounter (Signed)
I think placement is the right move. Would also be interested in palliative care team to come by when in the hospital for goals of care going forward. I find that team very helpful in general- she can ask about that if interested. Tell her thank you for calling

## 2017-11-14 NOTE — Progress Notes (Signed)
Verbal order to resume patient's diet, and then have patient be NPO after midnight Neta Mends RN 6:00 PM 11-14-2017

## 2017-11-14 NOTE — Progress Notes (Signed)
Surgery postponed until sodium level are corrected. Patient taken back to room 1535.

## 2017-11-14 NOTE — Progress Notes (Signed)
Ortho Note:  Surgery canceled today.  Will reschedule tentatively for Sat. Pending sodium repletion.  [place] for diet until Friday night at midnight. Sabinal for lovenox injectioni tomorrow am for DVT ppx.  Continue SCDs.

## 2017-11-14 NOTE — Progress Notes (Signed)
PROGRESS NOTE  Alexander Rice BJS:[id number] DOB: July 27, 1932 DOA: 11/12/2017 PCP: Marin Olp, MD  HPI/Recap of past 24 hours:  Alexander Rice is a 83 y.o. male with medical history significant of Parkinson's disease, advanced dementia, MDS, HTN.  Patient presents to the ED post fall on 11/11/17, helped to chair by EMS but not brought in to ED for eval.  Since that time has had severe R hip pain.  Doesn't walk very much at baseline but occasionally gets up with aid of walker.  Brought in to ED for severe R hip pain, worse with movement, constant, and persistent. Right femoral neck fracture on xray. Orthopedic surgery contacted with plan for right hip hemiarthroplasty 11/14/17.  Seen and examined. Alert but very confused in the setting of advanced dementia. Denies any pain.    Assessment/Plan: Principal Problem:   Closed displaced fracture of right femoral neck (HCC) Active Problems:   Essential hypertension   MDS (myelodysplastic syndrome) (HCC)   Hyponatremia   Severe dementia  1. R femoral neck fx - 1. R hemiarthroplasty 11/14/17 2. Hip fx pathway 3. Npo after midnight 2. Hyponatremia - possibly from pain vs others 1. Acute, improving 2. Na+ 124 from 120 from 118 (1 day ago). Na+ 134 (8 days ago) 3. Monitor urine output 4. Fluid restriction 5. BMP am 3. HTN - continue home meds 4. MDS - causing chronic transfusion dependent anemia 1. Type and screen 2. Hg 8.4 mcv 92 3. CBC am 5. Severe dementia - parkinson's disease 1. Continue sinemet 2. Chronic and baseline severe  6. Ambulatory dysfunction-fall precaution -PT per surgery post surgery-will require SNF for rehab   DVT prophylaxis: SCDs Code Status: Full Family Communication: No family member at bedside Disposition Plan: Planned surgery 11/14/17 Consults called: Orthopedic surgery   Objective: Vitals:   11/13/17 2115 11/13/17 2117 11/14/17 0531 11/14/17 1000  BP: (!) 117/35 (!) 127/53 (!)  152/51 (!) 162/57  Pulse: 68  67 77  Resp: 18  17 18   Temp: 99.4 F (37.4 C)  98.4 F (36.9 C) 98.5 F (36.9 C)  TempSrc: Oral  Oral Oral  SpO2: 95%  96% 97%  Weight:      Height:        Intake/Output Summary (Last 24 hours) at 11/14/2017 1056 Last data filed at 11/14/2017 1000 Gross per 24 hour  Intake 3790 ml  Output 2325 ml  Net 1465 ml   Filed Weights   11/12/17 2213  Weight: 74.9 kg (165 lb 2 oz)    Exam:  Constitutional: Alert but confused and does not respond to questions appropriately Eyes: PERRL, lids and conjunctivae normal ENMT: Mucous membranes are moist. Posterior pharynx clear of any exudate or lesions.Normal dentition.  Neck: normal, supple, no masses, no thyromegaly Respiratory: clear to auscultation bilaterally, no wheezing, no crackles. Normal respiratory effort. No accessory muscle use.  Cardiovascular: Regular rate and rhythm, no murmurs / rubs / gallops. No extremity edema. 2+ pedal pulses. No carotid bruits.  Abdomen: no tenderness, no masses palpated. No hepatosplenomegaly. Bowel sounds positive.  Musculoskeletal: trace edema bilaterally. Right hip mildly edematous Skin: no rashes, lesions, ulcers. No induration Neurologic: unable to assess. does not follow commands  Psychiatric: unable to assess due to alltered mental status   Data Reviewed: CBC: Recent Labs  Lab 11/12/17 1820  WBC 9.9  NEUTROABS 8.1*  HGB 8.4*  HCT 23.5*  MCV 92.2  PLT 607   Basic Metabolic Panel: Recent Labs  Lab 11/12/17 1820  11/13/17 0418  NA 118* 120*  K 4.6 4.2  CL 91* 95*  CO2 22 19*  GLUCOSE 109* 105*  BUN 16 17  CREATININE 0.75 0.77  CALCIUM 8.0* 7.7*   GFR: Estimated Creatinine Clearance: 65.4 mL/min (by C-G formula based on SCr of 0.77 mg/dL). Liver Function Tests: No results for input(s): AST, ALT, ALKPHOS, BILITOT, PROT, ALBUMIN in the last 168 hours. No results for input(s): LIPASE, AMYLASE in the last 168 hours. No results for input(s):  AMMONIA in the last 168 hours. Coagulation Profile: Recent Labs  Lab 11/12/17 1910  INR 1.22   Cardiac Enzymes: No results for input(s): CKTOTAL, CKMB, CKMBINDEX, TROPONINI in the last 168 hours. BNP (last 3 results) No results for input(s): PROBNP in the last 8760 hours. HbA1C: No results for input(s): HGBA1C in the last 72 hours. CBG: No results for input(s): GLUCAP in the last 168 hours. Lipid Profile: No results for input(s): CHOL, HDL, LDLCALC, TRIG, CHOLHDL, LDLDIRECT in the last 72 hours. Thyroid Function Tests: No results for input(s): TSH, T4TOTAL, FREET4, T3FREE, THYROIDAB in the last 72 hours. Anemia Panel: No results for input(s): VITAMINB12, FOLATE, FERRITIN, TIBC, IRON, RETICCTPCT in the last 72 hours. Urine analysis:    Component Value Date/Time   COLORURINE YELLOW 11/13/2017 0240   APPEARANCEUR CLEAR 11/13/2017 0240   LABSPEC 1.015 11/13/2017 0240   PHURINE 6.0 11/13/2017 0240   GLUCOSEU NEGATIVE 11/13/2017 0240   HGBUR NEGATIVE 11/13/2017 0240   HGBUR negative 10/11/2009 0819   BILIRUBINUR NEGATIVE 11/13/2017 0240   BILIRUBINUR n 04/18/2016 1152   KETONESUR 5 (A) 11/13/2017 0240   PROTEINUR NEGATIVE 11/13/2017 0240   UROBILINOGEN 1.0 04/18/2016 1152   UROBILINOGEN 0.2 10/11/2009 0819   NITRITE NEGATIVE 11/13/2017 0240   LEUKOCYTESUR NEGATIVE 11/13/2017 0240   Sepsis Labs: @LABRCNTIP (procalcitonin:4,lacticidven:4)  ) Recent Results (from the past 240 hour(s))  Surgical PCR screen     Status: None   Collection Time: 11/12/17 11:20 PM  Result Value Ref Range Status   MRSA, PCR NEGATIVE NEGATIVE Final   Staphylococcus aureus NEGATIVE NEGATIVE Final    Comment: (NOTE) The Xpert SA Assay (FDA approved for NASAL specimens in patients 65 years of age and older), is one component of a comprehensive surveillance program. It is not intended to diagnose infection nor to guide or monitor treatment.       Studies: No results found.  Scheduled Meds: .  acidophilus  1 capsule Oral Daily  . carbidopa-levodopa  0.5 tablet Oral TID  . docusate sodium  100 mg Oral BID  . dorzolamide  1 drop Both Eyes BID  . doxazosin  4 mg Oral QHS  . latanoprost  1 drop Both Eyes QHS  . lisinopril  5 mg Oral Daily  . memantine  10 mg Oral BID  . multivitamin with minerals  1 tablet Oral Daily  . pantoprazole  40 mg Oral Daily  . vitamin B-6  250 mg Oral Daily  . simvastatin  40 mg Oral QHS  . cyanocobalamin  100 mcg Oral Daily    Continuous Infusions: . sodium chloride 125 mL/hr at 11/13/17 1400  . sodium chloride       LOS: 2 days     Kayleen Memos, MD [facility] Pager [phone]  If 7PM-7AM, please contact night-coverage [email or web address] Password [facility] 11/14/2017, 10:56 AM

## 2017-11-14 NOTE — Progress Notes (Signed)
Surgery delayed due to hyponatremia.  Preoperative moderate to severe hyponatremia (<130) significantly increases the risk of 30-day postsurgical mortality. Recommend correction of sodium to >130

## 2017-11-14 NOTE — Care Management Note (Signed)
Case Management Note  Patient Details  Name: Alexander Rice MRN: [id number] Date of Birth: Oct 16, 1932  Pt is active with [facility] RN and PT [place] per Katrina.  Expected Discharge Date:   Unknown               Expected Discharge Plan:  [place]  Post Acute Care Choice:  Home Health Choice offered to:  Patient  HH Arranged:  RN, PT [facility] Agency:  [facility]  Status of Service:  In process, will continue to follow  Rae Mar, RN 11/14/2017, 1:34 PM

## 2017-11-15 DIAGNOSIS — S72009A Fracture of unspecified part of neck of unspecified femur, initial encounter for closed fracture: Secondary | ICD-10-CM

## 2017-11-15 DIAGNOSIS — F0390 Unspecified dementia without behavioral disturbance: Secondary | ICD-10-CM

## 2017-11-15 LAB — CBC
HEMATOCRIT: 25.2 % — AB (ref 39.0–52.0)
HEMOGLOBIN: 9.1 g/dL — AB (ref 13.0–17.0)
MCH: 32.7 pg (ref 26.0–34.0)
MCHC: 36.1 g/dL — ABNORMAL HIGH (ref 30.0–36.0)
MCV: 90.6 fL (ref 78.0–100.0)
Platelets: 307 10*3/uL (ref 150–400)
RBC: 2.78 MIL/uL — AB (ref 4.22–5.81)
RDW: 17.9 % — ABNORMAL HIGH (ref 11.5–15.5)
WBC: 4.1 10*3/uL (ref 4.0–10.5)

## 2017-11-15 LAB — BASIC METABOLIC PANEL
Anion gap: 5 (ref 5–15)
BUN: 13 mg/dL (ref 6–20)
CO2: 18 mmol/L — ABNORMAL LOW (ref 22–32)
Calcium: 7.5 mg/dL — ABNORMAL LOW (ref 8.9–10.3)
Chloride: 102 mmol/L (ref 101–111)
Creatinine, Ser: 0.72 mg/dL (ref 0.61–1.24)
GFR calc Af Amer: >60 mL/min (ref >60)
GFR calc non Af Amer: >60 mL/min (ref >60)
Glucose, Bld: 128 mg/dL — ABNORMAL HIGH (ref 65–99)
Potassium: 3.8 mmol/L (ref 3.5–5.1)
Sodium: 125 mmol/L — ABNORMAL LOW (ref 135–145)

## 2017-11-15 LAB — SODIUM, URINE, RANDOM: Sodium, Ur: 103 mmol/L

## 2017-11-15 LAB — CREATININE, URINE, RANDOM: CREATININE, URINE: 64.57 mg/dL

## 2017-11-15 LAB — OSMOLALITY: Osmolality: 263 mOsm/kg — ABNORMAL LOW (ref 275–295)

## 2017-11-15 LAB — OSMOLALITY, URINE: OSMOLALITY UR: 505 mosm/kg (ref 300–900)

## 2017-11-15 MED ORDER — AMLODIPINE BESYLATE 5 MG PO TABS
5.0000 mg | ORAL_TABLET | Freq: Every day | ORAL | Status: DC
  Administered 2017-11-15 – 2017-11-19 (×5): 5 mg via ORAL
  Filled 2017-11-15 (×6): qty 1

## 2017-11-15 MED ORDER — HYDRALAZINE HCL 20 MG/ML IJ SOLN: 5.0000 mg | Freq: Four times a day (QID) | INTRAMUSCULAR | Status: DC | PRN

## 2017-11-15 MED ORDER — SODIUM CHLORIDE 1 G PO TABS
1.0000 g | ORAL_TABLET | Freq: Two times a day (BID) | ORAL | Status: DC
  Administered 2017-11-15 – 2017-11-19 (×7): 1 g via ORAL
  Filled 2017-11-15 (×9): qty 1

## 2017-11-15 MED ORDER — FUROSEMIDE 10 MG/ML IJ SOLN
20.0000 mg | Freq: Once | INTRAMUSCULAR | Status: AC
  Administered 2017-11-15: 20 mg via INTRAVENOUS
  Filled 2017-11-15: qty 2

## 2017-11-15 MED ORDER — ATORVASTATIN CALCIUM 20 MG PO TABS
20.0000 mg | ORAL_TABLET | Freq: Every day | ORAL | Status: DC
  Administered 2017-11-15 – 2017-11-18 (×4): 20 mg via ORAL
  Filled 2017-11-15 (×4): qty 1

## 2017-11-15 MED ORDER — LISINOPRIL 10 MG PO TABS
10.0000 mg | ORAL_TABLET | Freq: Every day | ORAL | Status: DC
  Administered 2017-11-15: 10 mg via ORAL
  Filled 2017-11-15: qty 1

## 2017-11-15 NOTE — Progress Notes (Signed)
PROGRESS NOTE  Alexander JESSON DZH:[id number] DOB: 1932/08/21 DOA: 11/12/2017 PCP: Marin Olp, MD  Brief History:  83 year old male with a history of Parkinson's disease, dementia, myelodysplastic syndrome, hypertension, coronary artery disease presented to the emergency department after a fall on 11/11/2017.  Apparently EMS was activated, and the patient was helped back to the chair and not brought to the emergency department at that time.  Since then, the patient continued to have severe right hip pain with difficulty getting up.  As result, the patient was subsequently brought to the emergency department on 11/12/2017.  X-rays revealed an acute right femoral neck fracture.  Orthopedics was consulted and there are tentative plans for right hip  Hemiarthroplasty.  Unfortunately, surgery has been delayed secondary to the patient's profound hyponatremia.  At the time of admission, the patient was noted to have a serum sodium 118.  There is no history of any new medications, vomiting, or diarrhea.  The patient was started on intravenous fluids with slow improvement.  Assessment/Plan: Right femoral neck fracture -Appreciate orthopedics consult -Surgery has been delayed secondary to hyponatremia  Hyponatremia -Secondary to poor solute intake and SIADH (due to acute medical condition)  -review of the medical record shows that this has been chronic Usually with the range 128-132 -Continue IV fluids -Lasix 20 mg IV x1 -Urine osmolarity -Serum osmolarity -Urine sodium, urine creatinine -Discontinue lisinopril  -NaCl supplement  Essential hypertension -Discontinue lisinopril as this may contribute to hyponatremia -Restart amlodipine -Continue Cardura -hydralazine prn SBP >180  Myelodysplastic syndrome/refractory anemia with ring sideroblasts -Transfused 2 units PRBC 11/14/2017 -Baseline hemoglobin~9 -Previously transfused 2 units PRBC 11/08/2017 -follow Dr.  Marin Olp -Continue B12, B6, multivitamin  Parkinson's disease -Continue home dose Sinemet  Dementia -Continue Namenda  Hyperlipidemia -Continue statin   Disposition Plan:   Home in 2-3 days  Family Communication:  Daughter and spouse updated at bedside 12/28--Total time spent 35 minutes.  Greater than 50% spent face to face counseling and coordinating care.   Consultants:  ortho  Code Status:  FULL  DVT Prophylaxis:  SCDs   Procedures: As Listed in Progress Note Above  Antibiotics: None    Subjective: Patient denies fevers, chills, headache, chest pain, dyspnea, nausea, vomiting, diarrhea, abdominal pain, dysuria, hematuria, hematochezia, and melena.   Objective: Vitals:   11/15/17 0351 11/15/17 0531 11/15/17 1034 11/15/17 1405  BP: (!) 153/60 (!) 159/55 (!) 178/64 (!) 159/58  Pulse:  62 66 69  Resp:  16 16 16   Temp:  99.2 F (37.3 C) 98.7 F (37.1 C) 98.6 F (37 C)  TempSrc:  Oral Oral Oral  SpO2:  96% 97% 98%  Weight:      Height:        Intake/Output Summary (Last 24 hours) at 11/15/2017 1600 Last data filed at 11/15/2017 1400 Gross per 24 hour  Intake 3993 ml  Output 1425 ml  Net 2568 ml   Weight change:  Exam:   General:  Pt is alert, follows commands appropriately, not in acute distress  HEENT: No icterus, No thrush, No neck mass, [place]/AT  Cardiovascular: RRR, S1/S2, no rubs, no gallops  Respiratory: Bibasilar crackles.  No wheezing.  Good air movement.  Abdomen: Soft/+BS, non tender, non distended, no guarding  Extremities: 1 + LE edema, No lymphangitis, No petechiae, No rashes, no synovitis   Data Reviewed: I have personally reviewed following labs and imaging studies Basic Metabolic Panel: Recent Labs  Lab 11/12/17 1820  11/13/17 0418 11/14/17 1102 11/15/17 1425  NA 118* 120* 124* 125*  K 4.6 4.2 3.8 3.8  CL 91* 95* 101 102  CO2 22 19* 19* 18*  GLUCOSE 109* 105* 96 128*  BUN 16 17 15 13   CREATININE 0.75 0.77 0.80 0.72   CALCIUM 8.0* 7.7* 7.6* 7.5*   Liver Function Tests: No results for input(s): AST, ALT, ALKPHOS, BILITOT, PROT, ALBUMIN in the last 168 hours. No results for input(s): LIPASE, AMYLASE in the last 168 hours. No results for input(s): AMMONIA in the last 168 hours. Coagulation Profile: Recent Labs  Lab 11/12/17 1910  INR 1.22   CBC: Recent Labs  Lab 11/12/17 1820 11/15/17 1425  WBC 9.9 4.1  NEUTROABS 8.1*  --   HGB 8.4* 9.1*  HCT 23.5* 25.2*  MCV 92.2 90.6  PLT 325 307   Cardiac Enzymes: No results for input(s): CKTOTAL, CKMB, CKMBINDEX, TROPONINI in the last 168 hours. BNP: Invalid input(s): POCBNP CBG: No results for input(s): GLUCAP in the last 168 hours. HbA1C: No results for input(s): HGBA1C in the last 72 hours. Urine analysis:    Component Value Date/Time   COLORURINE YELLOW 11/13/2017 0240   APPEARANCEUR CLEAR 11/13/2017 0240   LABSPEC 1.015 11/13/2017 0240   PHURINE 6.0 11/13/2017 0240   GLUCOSEU NEGATIVE 11/13/2017 0240   HGBUR NEGATIVE 11/13/2017 0240   HGBUR negative 10/11/2009 0819   BILIRUBINUR NEGATIVE 11/13/2017 0240   BILIRUBINUR n 04/18/2016 1152   KETONESUR 5 (A) 11/13/2017 0240   PROTEINUR NEGATIVE 11/13/2017 0240   UROBILINOGEN 1.0 04/18/2016 1152   UROBILINOGEN 0.2 10/11/2009 0819   NITRITE NEGATIVE 11/13/2017 0240   LEUKOCYTESUR NEGATIVE 11/13/2017 0240   Sepsis Labs: @LABRCNTIP (procalcitonin:4,lacticidven:4) ) Recent Results (from the past 240 hour(s))  Surgical PCR screen     Status: None   Collection Time: 11/12/17 11:20 PM  Result Value Ref Range Status   MRSA, PCR NEGATIVE NEGATIVE Final   Staphylococcus aureus NEGATIVE NEGATIVE Final    Comment: (NOTE) The Xpert SA Assay (FDA approved for NASAL specimens in patients 53 years of age and older), is one component of a comprehensive surveillance program. It is not intended to diagnose infection nor to guide or monitor treatment.      Scheduled Meds: . acidophilus  1 capsule  Oral Daily  . amLODipine  5 mg Oral Daily  . atorvastatin  20 mg Oral q1800  . carbidopa-levodopa  0.5 tablet Oral TID  . [START ON 11/16/2017] chlorhexidine  60 mL Topical Once  . docusate sodium  100 mg Oral BID  . dorzolamide  1 drop Both Eyes BID  . doxazosin  4 mg Oral QHS  . furosemide  20 mg Intravenous Once  . latanoprost  1 drop Both Eyes QHS  . memantine  10 mg Oral BID  . multivitamin with minerals  1 tablet Oral Daily  . pantoprazole  40 mg Oral Daily  . [START ON 11/16/2017] povidone-iodine  2 application Topical Once  . vitamin B-6  250 mg Oral Daily  . cyanocobalamin  100 mcg Oral Daily   Continuous Infusions: . sodium chloride 125 mL/hr at 11/15/17 0517  . sodium chloride    . [START ON 11/16/2017]  ceFAZolin (ANCEF) IV    . lactated ringers 50 mL/hr at 11/14/17 1721  . tranexamic acid      Procedures/Studies: Dg Lumbar Spine Complete  Result Date: 11/12/2017 CLINICAL DATA:  Unwitnessed fall EXAM: LUMBAR SPINE - COMPLETE 4+ VIEW COMPARISON:  CT 09/04/2017 FINDINGS: Mild  scoliosis of the lumbar spine, apex left. Extensive atherosclerotic vascular disease of the aorta and branch vessels with calcified aneurysm of the mid to distal abdominal aorta, measuring up to 4.7 cm anterior to posterior. Vertebral body heights appear maintained. Multilevel moderate to marked degenerative disc changes. Surgical hardware posterior to L4-L5. IMPRESSION: 1. Scoliosis of the lumbar spine with advanced degenerative change. No acute osseous abnormality 2. Calcified abdominal aortic aneurysm, measures up to 4.8 cm maximum AP, compared with 35 mm on the comparison CT ; follow-up CTA would more accurately characterize the aneurysm and assess for true interval change in size Electronically Signed   By: Donavan Foil M.D.   On: 11/12/2017 18:38   Ct Head Wo Contrast  Result Date: 11/12/2017 CLINICAL DATA:  Unwitnessed fall. EXAM: CT HEAD WITHOUT CONTRAST CT CERVICAL SPINE WITHOUT CONTRAST  TECHNIQUE: Multidetector CT imaging of the head and cervical spine was performed following the standard protocol without intravenous contrast. Multiplanar CT image reconstructions of the cervical spine were also generated. COMPARISON:  None. FINDINGS: CT HEAD FINDINGS Brain: Mild chronic ischemic white matter disease is noted. No mass effect or midline shift is noted. Ventricular size is within normal limits. There is no evidence of mass lesion, hemorrhage or acute infarction. Vascular: No hyperdense vessel or unexpected calcification. Skull: Normal. Negative for fracture or focal lesion. Sinuses/Orbits: No acute finding. Other: None. CT CERVICAL SPINE FINDINGS Alignment: Mild reversal of normal lordosis is noted most likely due degenerative change. Skull base and vertebrae: No acute fracture. No primary bone lesion or focal pathologic process. Soft tissues and spinal canal: No prevertebral fluid or swelling. No visible canal hematoma. Disc levels: Severe degenerative disc disease is noted at C3-4, C4-5, C5-6, C6-7 and C7-T1. Upper chest: Negative. Other: Mild degenerative changes are seen involving posterior facet joints bilaterally. IMPRESSION: Mild chronic ischemic white matter disease. No acute intracranial abnormality seen. Severe multilevel degenerative disc disease. No acute abnormality seen in the cervical spine. Electronically Signed   By: Marijo Conception, M.D.   On: 11/12/2017 18:15   Ct Cervical Spine Wo Contrast  Result Date: 11/12/2017 CLINICAL DATA:  Unwitnessed fall. EXAM: CT HEAD WITHOUT CONTRAST CT CERVICAL SPINE WITHOUT CONTRAST TECHNIQUE: Multidetector CT imaging of the head and cervical spine was performed following the standard protocol without intravenous contrast. Multiplanar CT image reconstructions of the cervical spine were also generated. COMPARISON:  None. FINDINGS: CT HEAD FINDINGS Brain: Mild chronic ischemic white matter disease is noted. No mass effect or midline shift is noted.  Ventricular size is within normal limits. There is no evidence of mass lesion, hemorrhage or acute infarction. Vascular: No hyperdense vessel or unexpected calcification. Skull: Normal. Negative for fracture or focal lesion. Sinuses/Orbits: No acute finding. Other: None. CT CERVICAL SPINE FINDINGS Alignment: Mild reversal of normal lordosis is noted most likely due degenerative change. Skull base and vertebrae: No acute fracture. No primary bone lesion or focal pathologic process. Soft tissues and spinal canal: No prevertebral fluid or swelling. No visible canal hematoma. Disc levels: Severe degenerative disc disease is noted at C3-4, C4-5, C5-6, C6-7 and C7-T1. Upper chest: Negative. Other: Mild degenerative changes are seen involving posterior facet joints bilaterally. IMPRESSION: Mild chronic ischemic white matter disease. No acute intracranial abnormality seen. Severe multilevel degenerative disc disease. No acute abnormality seen in the cervical spine. Electronically Signed   By: Marijo Conception, M.D.   On: 11/12/2017 18:15   Chest Portable 1 View  Result Date: 11/12/2017 CLINICAL DATA:  Preop EXAM:  PORTABLE CHEST 1 VIEW COMPARISON:  Radiographs 11/12/2017, chest x-ray 09/04/2017, 04/26/2016. FINDINGS: Right azygos lobe. Stable elevation of left diaphragm with minimal basilar atelectasis. Mild cardiomegaly with aortic atherosclerosis. No pneumothorax. Multiple old left upper rib fractures. IMPRESSION: Elevation of the left diaphragm as before with patchy basilar atelectasis. Cardiomegaly without edema or focal pulmonary infiltrate Electronically Signed   By: Donavan Foil M.D.   On: 11/12/2017 20:57   Dg Hips Bilat W Or Wo Pelvis 3-4 Views  Result Date: 11/12/2017 CLINICAL DATA:  Unwitnessed fall EXAM: DG HIP (WITH OR WITHOUT PELVIS) 3-4V BILAT COMPARISON:  CT 09/04/2017 FINDINGS: Surgical hardware at the lumbar spine. SI joints are not widened. Vascular calcifications. Slightly limited positioning of  the left hip with trochanters superimposed over the femoral neck. No obvious fracture is seen. Acute right femoral neck fracture with superior migration of the distal femur. No femoral head dislocation. Extensive vascular calcifications. IMPRESSION: 1. Acute right femoral neck fracture.  No femoral head dislocation 2. No definite acute osseous abnormality of the left hip Electronically Signed   By: Donavan Foil M.D.   On: 11/12/2017 18:32    Orson Eva, DO  [facility] Pager [phone]  If 7PM-7AM, please contact night-coverage [email or web address] Password [email or web address] 11/15/2017, 4:00 PM   LOS: 3 days

## 2017-11-15 NOTE — Progress Notes (Signed)
Verbal order received for In and out cath once Arsenio Loader 5:35 PM 11-15-2017

## 2017-11-15 NOTE — Anesthesia Preprocedure Evaluation (Addendum)
Anesthesia Evaluation  Patient identified by MRN, date of birth, ID band Patient awake    Reviewed: Allergy & Precautions, NPO status   Airway Mallampati: III  TM Distance: >3 FB     Dental  (+) Dental Advisory Given   Pulmonary former smoker,    breath sounds clear to auscultation       Cardiovascular hypertension, + CAD, + Cardiac Stents and + Peripheral Vascular Disease   Rhythm:Regular Rate:Normal     Neuro/Psych Dementia negative psych ROS   GI/Hepatic Neg liver ROS, hiatal hernia, GERD  Controlled and Medicated,Hx esophageal stricture   Endo/Other  negative endocrine ROS  Renal/GU negative Renal ROS   BPH    Musculoskeletal   Abdominal   Peds  Hematology  (+) anemia , Myelodysplastic syndrome   Anesthesia Other Findings Hyponatremia (improving), hypocalcemia  Reproductive/Obstetrics                           Anesthesia Physical  Anesthesia Plan  ASA: III  Anesthesia Plan: General   Post-op Pain Management:    Induction: Intravenous  PONV Risk Score and Plan: 2 and Ondansetron, Treatment may vary due to age or medical condition and Propofol infusion  Airway Management Planned: Oral ETT  Additional Equipment: None  Intra-op Plan:   Post-operative Plan: Extubation in OR  Informed Consent: I have reviewed the patients History and Physical, chart, labs and discussed the procedure including the risks, benefits and alternatives for the proposed anesthesia with the patient or authorized representative who has indicated his/her understanding and acceptance.   Dental advisory given  Plan Discussed with: CRNA  Anesthesia Plan Comments:       Anesthesia Quick Evaluation

## 2017-11-15 NOTE — Progress Notes (Signed)
On call doctor was notified about patient's BP being 174/66

## 2017-11-15 NOTE — Care Management Important Message (Signed)
Important Message  Patient Details  Name: Alexander Rice MRN: [id number] Date of Birth: 03-09-1932   Medicare Important Message Given:  Yes    Kerin Salen 11/15/2017, 10:00 AMImportant Message  Patient Details  Name: Alexander Rice MRN: [id number] Date of Birth: 02-19-1932   Medicare Important Message Given:  Yes    Kerin Salen 11/15/2017, 10:00 AM

## 2017-11-16 ENCOUNTER — Inpatient Hospital Stay (HOSPITAL_COMMUNITY): Payer: Medicare Other

## 2017-11-16 ENCOUNTER — Inpatient Hospital Stay (HOSPITAL_COMMUNITY): Payer: Medicare Other | Admitting: Anesthesiology

## 2017-11-16 ENCOUNTER — Encounter (HOSPITAL_COMMUNITY)
Admission: EM | Disposition: A | Discharge status: Skilled Nursing Facility | Payer: Self-pay | Source: Home / Self Care | Attending: Internal Medicine

## 2017-11-16 DIAGNOSIS — L8990 Pressure ulcer of unspecified site, unspecified stage: Secondary | ICD-10-CM

## 2017-11-16 HISTORY — PX: HIP ARTHROPLASTY: SHX981

## 2017-11-16 LAB — CBC
HEMATOCRIT: 26.2 % — AB (ref 39.0–52.0)
HEMOGLOBIN: 9.5 g/dL — AB (ref 13.0–17.0)
MCH: 32.8 pg (ref 26.0–34.0)
MCHC: 36.3 g/dL — AB (ref 30.0–36.0)
MCV: 90.3 fL (ref 78.0–100.0)
Platelets: 316 10*3/uL (ref 150–400)
RBC: 2.9 MIL/uL — ABNORMAL LOW (ref 4.22–5.81)
RDW: 17.8 % — ABNORMAL HIGH (ref 11.5–15.5)
WBC: 5 10*3/uL (ref 4.0–10.5)

## 2017-11-16 LAB — MAGNESIUM: MAGNESIUM: 1.5 mg/dL — AB (ref 1.7–2.4)

## 2017-11-16 LAB — BPAM RBC
BLOOD PRODUCT EXPIRATION DATE: 201901232359
BLOOD PRODUCT EXPIRATION DATE: 201901232359
BLOOD PRODUCT EXPIRATION DATE: 201901232359
Blood Product Expiration Date: 201901232359
ISSUE DATE / TIME: 201812271428
ISSUE DATE / TIME: 201812272123
UNIT TYPE AND RH: 6200
UNIT TYPE AND RH: 6200
Unit Type and Rh: 6200
Unit Type and Rh: 6200

## 2017-11-16 LAB — BASIC METABOLIC PANEL
ANION GAP: 4 — AB (ref 5–15)
BUN: 16 mg/dL (ref 6–20)
CALCIUM: 7.6 mg/dL — AB (ref 8.9–10.3)
CO2: 20 mmol/L — AB (ref 22–32)
Chloride: 103 mmol/L (ref 101–111)
Creatinine, Ser: 0.73 mg/dL (ref 0.61–1.24)
GFR calc Af Amer: >60 mL/min (ref >60)
GFR calc non Af Amer: >60 mL/min (ref >60)
GLUCOSE: 116 mg/dL — AB (ref 65–99)
Potassium: 3.9 mmol/L (ref 3.5–5.1)
Sodium: 127 mmol/L — ABNORMAL LOW (ref 135–145)

## 2017-11-16 LAB — TYPE AND SCREEN
ABO/RH(D): A POS
Antibody Screen: NEGATIVE
UNIT DIVISION: 0
UNIT DIVISION: 0
UNIT DIVISION: 0
Unit division: 0

## 2017-11-16 LAB — PREPARE RBC (CROSSMATCH)

## 2017-11-16 SURGERY — HEMIARTHROPLASTY, HIP, DIRECT ANTERIOR APPROACH, FOR FRACTURE
Anesthesia: General | Site: Hip | Laterality: Right

## 2017-11-16 MED ORDER — ONDANSETRON HCL 4 MG PO TABS: 4.0000 mg | ORAL_TABLET | Freq: Four times a day (QID) | ORAL | Status: DC | PRN

## 2017-11-16 MED ORDER — ESMOLOL HCL 100 MG/10ML IV SOLN
INTRAVENOUS | Status: AC
  Filled 2017-11-16: qty 10

## 2017-11-16 MED ORDER — METHOCARBAMOL 1000 MG/10ML IJ SOLN
500.0000 mg | Freq: Once | INTRAVENOUS | Status: DC
  Filled 2017-11-16: qty 5

## 2017-11-16 MED ORDER — LIDOCAINE 2% (20 MG/ML) 5 ML SYRINGE
INTRAMUSCULAR | Status: DC | PRN
  Administered 2017-11-16: 60 mg via INTRAVENOUS

## 2017-11-16 MED ORDER — ONDANSETRON HCL 4 MG/2ML IJ SOLN: 4.0000 mg | Freq: Once | INTRAMUSCULAR | Status: DC | PRN

## 2017-11-16 MED ORDER — BUPIVACAINE HCL (PF) 0.25 % IJ SOLN
INTRAMUSCULAR | Status: AC
  Filled 2017-11-16: qty 30

## 2017-11-16 MED ORDER — ROCURONIUM BROMIDE 50 MG/5ML IV SOSY
PREFILLED_SYRINGE | INTRAVENOUS | Status: DC | PRN
  Administered 2017-11-16 (×2): 10 mg via INTRAVENOUS
  Administered 2017-11-16: 50 mg via INTRAVENOUS

## 2017-11-16 MED ORDER — ACETAMINOPHEN 650 MG RE SUPP: 650.0000 mg | RECTAL | Status: DC | PRN

## 2017-11-16 MED ORDER — METHOCARBAMOL 1000 MG/10ML IJ SOLN
500.0000 mg | Freq: Once | INTRAVENOUS | Status: AC
  Administered 2017-11-16: 500 mg via INTRAVENOUS
  Filled 2017-11-16: qty 5

## 2017-11-16 MED ORDER — FENTANYL CITRATE (PF) 250 MCG/5ML IJ SOLN
INTRAMUSCULAR | Status: AC
  Filled 2017-11-16: qty 5

## 2017-11-16 MED ORDER — LACTATED RINGERS IV SOLN
INTRAVENOUS | Status: DC | PRN
  Administered 2017-11-16: 08:00:00 via INTRAVENOUS

## 2017-11-16 MED ORDER — ACETAMINOPHEN 10 MG/ML IV SOLN
1000.0000 mg | Freq: Once | INTRAVENOUS | Status: AC
  Administered 2017-11-16: 1000 mg via INTRAVENOUS

## 2017-11-16 MED ORDER — BUPIVACAINE HCL 0.25 % IJ SOLN
INTRAMUSCULAR | Status: DC | PRN
  Administered 2017-11-16: 30 mL

## 2017-11-16 MED ORDER — STERILE WATER FOR IRRIGATION IR SOLN
Status: DC | PRN
  Administered 2017-11-16: 2000 mL

## 2017-11-16 MED ORDER — CEFAZOLIN SODIUM-DEXTROSE 1-4 GM/50ML-% IV SOLN
1.0000 g | Freq: Four times a day (QID) | INTRAVENOUS | Status: AC
  Administered 2017-11-16 – 2017-11-17 (×3): 1 g via INTRAVENOUS
  Filled 2017-11-16 (×3): qty 50

## 2017-11-16 MED ORDER — PHENYLEPHRINE HCL 10 MG/ML IJ SOLN
INTRAMUSCULAR | Status: DC | PRN
  Administered 2017-11-16: 80 ug via INTRAVENOUS
  Administered 2017-11-16: 40 ug via INTRAVENOUS
  Administered 2017-11-16: 80 ug via INTRAVENOUS

## 2017-11-16 MED ORDER — ALBUMIN HUMAN 5 % IV SOLN
INTRAVENOUS | Status: DC | PRN
  Administered 2017-11-16: 09:00:00 via INTRAVENOUS

## 2017-11-16 MED ORDER — SUGAMMADEX SODIUM 200 MG/2ML IV SOLN
INTRAVENOUS | Status: DC | PRN
Start: 2017-11-16 — End: 2017-11-16
  Administered 2017-11-16: 150 mg via INTRAVENOUS

## 2017-11-16 MED ORDER — CEFAZOLIN SODIUM-DEXTROSE 2-4 GM/100ML-% IV SOLN
INTRAVENOUS | Status: AC
  Filled 2017-11-16: qty 100

## 2017-11-16 MED ORDER — METOCLOPRAMIDE HCL 5 MG PO TABS: 5.0000 mg | ORAL_TABLET | Freq: Three times a day (TID) | ORAL | Status: DC | PRN

## 2017-11-16 MED ORDER — FENTANYL CITRATE (PF) 100 MCG/2ML IJ SOLN: 25.0000 ug | INTRAMUSCULAR | Status: DC | PRN

## 2017-11-16 MED ORDER — LIDOCAINE 2% (20 MG/ML) 5 ML SYRINGE
INTRAMUSCULAR | Status: AC
  Filled 2017-11-16: qty 5

## 2017-11-16 MED ORDER — DEXAMETHASONE SODIUM PHOSPHATE 10 MG/ML IJ SOLN
INTRAMUSCULAR | Status: AC
  Filled 2017-11-16: qty 1

## 2017-11-16 MED ORDER — ONDANSETRON HCL 4 MG/2ML IJ SOLN: 4.0000 mg | Freq: Four times a day (QID) | INTRAMUSCULAR | Status: DC | PRN

## 2017-11-16 MED ORDER — ONDANSETRON HCL 4 MG/2ML IJ SOLN
INTRAMUSCULAR | Status: DC | PRN
Start: 2017-11-16 — End: 2017-11-16
  Administered 2017-11-16: 4 mg via INTRAVENOUS

## 2017-11-16 MED ORDER — METOCLOPRAMIDE HCL 5 MG/ML IJ SOLN: 5.0000 mg | Freq: Three times a day (TID) | INTRAMUSCULAR | Status: DC | PRN

## 2017-11-16 MED ORDER — FENTANYL CITRATE (PF) 100 MCG/2ML IJ SOLN
INTRAMUSCULAR | Status: DC | PRN
  Administered 2017-11-16 (×2): 25 ug via INTRAVENOUS
  Administered 2017-11-16: 50 ug via INTRAVENOUS
  Administered 2017-11-16: 25 ug via INTRAVENOUS

## 2017-11-16 MED ORDER — DEXAMETHASONE SODIUM PHOSPHATE 10 MG/ML IJ SOLN
INTRAMUSCULAR | Status: DC | PRN
  Administered 2017-11-16: 10 mg via INTRAVENOUS

## 2017-11-16 MED ORDER — ACETAMINOPHEN 10 MG/ML IV SOLN
INTRAVENOUS | Status: AC
  Filled 2017-11-16: qty 100

## 2017-11-16 MED ORDER — SODIUM CHLORIDE 0.9 % IR SOLN
Status: DC | PRN
  Administered 2017-11-16: 1000 mL

## 2017-11-16 MED ORDER — ACETAMINOPHEN 325 MG PO TABS
650.0000 mg | ORAL_TABLET | ORAL | Status: DC | PRN
  Administered 2017-11-17 – 2017-11-18 (×2): 650 mg via ORAL
  Filled 2017-11-16 (×2): qty 2

## 2017-11-16 MED ORDER — ONDANSETRON HCL 4 MG/2ML IJ SOLN
INTRAMUSCULAR | Status: AC
  Filled 2017-11-16: qty 2

## 2017-11-16 MED ORDER — PROPOFOL 10 MG/ML IV BOLUS
INTRAVENOUS | Status: AC
  Filled 2017-11-16: qty 20

## 2017-11-16 MED ORDER — ESMOLOL HCL 100 MG/10ML IV SOLN
INTRAVENOUS | Status: DC | PRN
  Administered 2017-11-16: 10 mg via INTRAVENOUS

## 2017-11-16 MED ORDER — PROPOFOL 10 MG/ML IV BOLUS
INTRAVENOUS | Status: DC | PRN
  Administered 2017-11-16: 80 mg via INTRAVENOUS
  Administered 2017-11-16: 20 mg via INTRAVENOUS

## 2017-11-16 SURGICAL SUPPLY — 37 items
BAG ZIPLOCK 12X15 (MISCELLANEOUS) ×3 IMPLANT
BLADE SAW SGTL 18X1.27X75 (BLADE) ×2 IMPLANT
BLADE SAW SGTL 18X1.27X75MM (BLADE) ×1
CAPT HIP HEMI 2 ×3 IMPLANT
COVER SURGICAL LIGHT HANDLE (MISCELLANEOUS) ×3 IMPLANT
DERMABOND ADVANCED (GAUZE/BANDAGES/DRESSINGS) ×2
DERMABOND ADVANCED .7 DNX12 (GAUZE/BANDAGES/DRESSINGS) ×1 IMPLANT
DRAPE HIP W/POCKET STRL (DRAPE) ×3 IMPLANT
DRAPE POUCH INSTRU U-SHP 10X18 (DRAPES) ×3 IMPLANT
DRAPE SURG 17X11 SM STRL (DRAPES) ×3 IMPLANT
DRAPE U-SHAPE 47X51 STRL (DRAPES) ×3 IMPLANT
DRESSING AQUACEL AG SP 3.5X10 (GAUZE/BANDAGES/DRESSINGS) ×1 IMPLANT
DRSG AQUACEL AG ADV 3.5X10 (GAUZE/BANDAGES/DRESSINGS) ×3 IMPLANT
DRSG AQUACEL AG SP 3.5X10 (GAUZE/BANDAGES/DRESSINGS) ×3
DURAPREP 26ML APPLICATOR (WOUND CARE) ×3 IMPLANT
ELECT BLADE TIP CTD 4 INCH (ELECTRODE) ×3 IMPLANT
ELECT REM PT RETURN 15FT ADLT (MISCELLANEOUS) ×3 IMPLANT
FACESHIELD WRAPAROUND (MASK) ×9 IMPLANT
GLOVE BIO SURGEON STRL SZ7.5 (GLOVE) ×6 IMPLANT
GLOVE INDICATOR 8.0 STRL GRN (GLOVE) ×3 IMPLANT
GOWN STRL REUS W/TWL LRG LVL3 (GOWN DISPOSABLE) ×3 IMPLANT
KIT BASIN OR (CUSTOM PROCEDURE TRAY) ×3 IMPLANT
MANIFOLD NEPTUNE II (INSTRUMENTS) ×3 IMPLANT
NDL SAFETY ECLIPSE 18X1.5 (NEEDLE) ×1 IMPLANT
NEEDLE HYPO 18GX1.5 SHARP (NEEDLE) ×2
PACK TOTAL JOINT (CUSTOM PROCEDURE TRAY) ×3 IMPLANT
POSITIONER SURGICAL ARM (MISCELLANEOUS) ×3 IMPLANT
SUT ETHIBOND NAB CT1 #1 30IN (SUTURE) ×3 IMPLANT
SUT MNCRL AB 4-0 PS2 18 (SUTURE) ×3 IMPLANT
SUT STRATAFIX PDO 1 14 VIOLET (SUTURE) ×2
SUT STRATFX PDO 1 14 VIOLET (SUTURE) ×1
SUT VIC AB 0 CT1 36 (SUTURE) ×3 IMPLANT
SUT VIC AB 2-0 CT1 27 (SUTURE) ×2
SUT VIC AB 2-0 CT1 TAPERPNT 27 (SUTURE) ×1 IMPLANT
SUTURE STRATFX PDO 1 14 VIOLET (SUTURE) ×1 IMPLANT
SYR 30ML LL (SYRINGE) ×3 IMPLANT
TOWEL OR 17X26 10 PK STRL BLUE (TOWEL DISPOSABLE) ×6 IMPLANT

## 2017-11-16 NOTE — Anesthesia Procedure Notes (Signed)
Procedure Name: Intubation Date/Time: 11/16/2017 8:43 AM Performed by: West Pugh, CRNA Pre-anesthesia Checklist: Patient identified, Emergency Drugs available, Suction available, Patient being monitored and Timeout performed Patient Re-evaluated:Patient Re-evaluated prior to induction Oxygen Delivery Method: Circle system utilized Preoxygenation: Pre-oxygenation with 100% oxygen Induction Type: IV induction Ventilation: Mask ventilation without difficulty Laryngoscope Size: Mac and 4 Grade View: Grade I Tube type: Oral Tube size: 7.5 mm Number of attempts: 1 Airway Equipment and Method: Stylet Placement Confirmation: ETT inserted through vocal cords under direct vision,  positive ETCO2,  CO2 detector and breath sounds checked- equal and bilateral Secured at: 24 cm Tube secured with: Tape Dental Injury: Teeth and Oropharynx as per pre-operative assessment

## 2017-11-16 NOTE — Anesthesia Postprocedure Evaluation (Signed)
Anesthesia Post Note  Patient: Alexander Rice  Procedure(s) Performed: ARTHROPLASTY BIPOLAR HIP (HEMIARTHROPLASTY) RIGHT (Right Hip)     Patient location during evaluation: PACU Anesthesia Type: General Level of consciousness: awake and alert Pain management: pain level controlled Vital Signs Assessment: post-procedure vital signs reviewed and stable Respiratory status: spontaneous breathing, nonlabored ventilation and respiratory function stable Cardiovascular status: blood pressure returned to baseline and stable Postop Assessment: no apparent nausea or vomiting Anesthetic complications: no    Last Vitals:  Vitals:   11/16/17 0739 11/16/17 1032  BP: (!) 170/59 131/61  Pulse: 73   Resp: 18   Temp: 36.9 C 37.1 C  SpO2: 96%     Last Pain:  Vitals:   11/16/17 0739  TempSrc: Axillary  PainSc: 0-No pain                 Audry Pili

## 2017-11-16 NOTE — H&P (Signed)
H&P update  The surgical history has been reviewed and remains accurate without interval change.  The patient was re-examined and patient's physiologic condition has not changed significantly in the last 30 days. The condition still exists that makes this procedure necessary. The treatment plan remains the same, without new options for care.  No new pharmacological allergies or types of therapy has been initiated that would change the plan or the appropriateness of the plan.  The patient and/or family understand the potential benefits and risks.  Jason P. Stann Mainland, MD 11/16/2017 8:30 AM

## 2017-11-16 NOTE — Discharge Instructions (Signed)
Orthopedic postop instructions:  -Okay for full weightbearing as tolerated to the right lower extremity. -Maintain your postoperative bandage until the follow-up appointment.  This is waterproof and you may shower with this bandage in place.  However, do not submerge underwater. -Maintain posterior hip precautions. -For prevention of blood clots take an 81 mg aspirin twice daily for 6 weeks. -Follow-up appointment with Dr. Stann Mainland should be scheduled for 3 weeks from surgery.

## 2017-11-16 NOTE — Op Note (Signed)
ARTHROPLASTY BIPOLAR HIP (HEMIARTHROPLASTY) RIGHT  Alexander Rice   [id number]  Pre-op Diagnosis: right femoral neck fracture     Post-op Diagnosis: same   Operative Procedures  1. Open treatment of femoral fracture, proximal end, neck, prosthetic replacement CPT 27236  Personnel  Surgeon(s): Nicholes Stairs, MD   Anesthesia: General  Prosthesis: Depuy  Femur: Corail KA 13 Head: 52 millimeter size: +8.5 Bearing Type: Bipolar  Date of Service: 11/16/2017  Indication: 83 y.o.. year old male who suffered a ground level fall and was found to have sustained a Right hip fracture. The patient was found to have a hip fracture that was appropriate for operative management. We reviewed the risk and benefits with the patient and family and they elected to proceed.  [name] has fairly substantial dementia and discussion for surgery was undertaken between myself and his family.  They did provide informed consent.  His surgery was unfortunately delayed initially due to hyponatremia that was managed with fluid resuscitation and free water restriction.  He was appropriate for surgery today and we did proceed.  Procedure: After informed consent was obtained and understanding of the risk were voiced including but not limited to bleeding, infection, damage to surrounding structures including nerves and vessels, blood clots, leg length inequality, dislocation and the failure to achieve desired results, including death, the operative extremity was marked with verbal confirmation of the patient in the holding area.   The patient was then brought to the operating room and transported to the operating room table and placed in the lateral decubitus position. The operative limb was then prepped and draped in the usual sterile fashion and preoperative antibiotics were administered.  A time out was performed prior to the start of surgery confirming the correct extremity, preoperative antibiotic  administration, as well as team members, and that implants and instruments available for the case. Correct surgical site was also confirmed with preoperative radiographs. A standard posterior approach to the hip was performed. A capsulotomy was performed and the capsule tagged for later repair. The labrum was preserved. The hip was dislocated and the femoral neck cut was made at approximately 1.5 cm above the level of the lesser trochanter using the femoral neck cutting guide. We then used a corkscrew and cobb to remove the femoral head from the acetabulum. We measured the head and proceeded to trial head sizes and found 52 mm to the the appropriate size. We then turned our attention to femoral preparation. We began sequential broaching to a size 13 stem. This size produced good fit and rotational stability. We placed the trial neck and a 52 mm +8.5 head.  Leg lengths were evaluated on the table. The hip was stable in extension and external rotation without impingement. The hip was stable in deep flexion. In 90 degrees of flexion and neutral abduction the hip was stable to 80 degrees of internal rotation. In a position of sleep with hip adducted across the body the hip was stable to 70 degrees of rotation.  We then turned back to the femur and tested the broach for stability and fit, and removed the trial broach. The final femoral implant was placed, and the trial head was again trialed for stability. We then irrigated and dried the trunion, and the final head was placed. We placed an 52 mm +8.5 head. The hip was again reduced, and taken through a range of motion and found to be stable as above. The hip was thoroughly irrigated, and a  posterior capsular repair was performed with #2 Ethibond. The wound was irrigated with normal saline. The deep fascia was closed with 1 PDS, and a running locking barbed strata fix, 0 vicryl for the deep fat layer, and 2.0 Monocryl for the subcutaneous tissue. The skin was closed  with staples. A sterile dressing was applied. The patient was awakened and transported to the recovery room in stable condition. All sponge, needle, and instrument counts were correct at the end of the case.  There were no immediate complications.  Position: lateral decubitus   Complications: none.  Time Out: performed   Drains/Packing: None  Estimated blood loss: 300 cc  Returned to Recovery Room: in good condition.   Antibiotics: yes   Mechanical VTE (DVT) Prophylaxis: sequential compression devices, TED thigh-high  Chemical VTE (DVT) Prophylaxis: Twice daily 81 mg aspirin times 6 weeks.  Fluid Replacement  Crystalloid: see anesthesia record Blood: none  FFP: none    Sponge and Instrument Count Correct? yes   PACU: portable radiograph - low AP pelvis  Admission: inpatient status, start PT & OT POD#1  Plan/RTC: Return in 2 weeks for wound check.  Weight Bearing/Load Lower Extremity: full  Posterior hip precautions   Geralynn Rile, [place] 10:30 AM

## 2017-11-16 NOTE — Transfer of Care (Signed)
Immediate Anesthesia Transfer of Care Note  Patient: Alexander Rice  Procedure(s) Performed: ARTHROPLASTY BIPOLAR HIP (HEMIARTHROPLASTY) RIGHT (Right Hip)  Patient Location: PACU  Anesthesia Type:General  Level of Consciousness: drowsy and responds to stimulation  Airway & Oxygen Therapy: Patient connected to nasal cannula oxygen and Patient connected to face mask oxygen  Post-op Assessment: Report given to RN and Post -op Vital signs reviewed and stable  Post vital signs: Reviewed and stable  Last Vitals:  Vitals:   11/16/17 0609 11/16/17 0739  BP: (!) 171/65 (!) 170/59  Pulse: 81 73  Resp: 18 18  Temp: 36.8 C 36.9 C  SpO2: 96% 96%    Last Pain:  Vitals:   11/16/17 0739  TempSrc: Axillary  PainSc: 0-No pain         Complications: No apparent anesthesia complications

## 2017-11-16 NOTE — Brief Op Note (Signed)
11/16/2017  10:27 AM  PATIENT:  Alexander Rice  83 y.o. male  PRE-OPERATIVE DIAGNOSIS:  right femoral neck fracture  POST-OPERATIVE DIAGNOSIS:  right femoral neck fracture  PROCEDURE:  Procedure(s): ARTHROPLASTY BIPOLAR HIP (HEMIARTHROPLASTY) RIGHT (Right)  SURGEON:  Surgeon(s) and Role:    * Nicholes Stairs, MD - Primary  PHYSICIAN ASSISTANT:   ASSISTANTS: none   ANESTHESIA:   general  EBL:  300 mL   BLOOD ADMINISTERED:none  DRAINS: none   LOCAL MEDICATIONS USED:  MARCAINE     SPECIMEN:  No Specimen  DISPOSITION OF SPECIMEN:  N/A  COUNTS:  YES  TOURNIQUET:  * No tourniquets in log *  DICTATION: .Note written in EPIC  PLAN OF CARE: Admit to inpatient   PATIENT DISPOSITION:  PACU - hemodynamically stable.   Delay start of Pharmacological VTE agent (>24hrs) due to surgical blood loss or risk of bleeding: not applicable

## 2017-11-16 NOTE — Progress Notes (Addendum)
PROGRESS NOTE  Alexander Rice OXB:[id number] DOB: [birth year] DOA: 11/12/2017 PCP: Marin Olp, MD  Brief History:  83 year old male with a history of Parkinson's disease, dementia, myelodysplastic syndrome, hypertension, coronary artery disease presented to the emergency department after a fall on 11/11/2017.  Apparently EMS was activated, and the patient was helped back to the chair and not brought to the emergency department at that time.  Since then, the patient continued to have severe right hip pain with difficulty getting up.  As result, the patient was subsequently brought to the emergency department on 11/12/2017.  X-rays revealed an acute right femoral neck fracture.  Orthopedics was consulted and there are tentative plans for right hip  Hemiarthroplasty.  Unfortunately, surgery has been delayed secondary to the patient's profound hyponatremia.  At the time of admission, the patient was noted to have a serum sodium 118.  There is no history of any new medications, vomiting, or diarrhea.  The patient was started on intravenous fluids with slow improvement.  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   Objective: Vitals:   11/16/17 0739 11/16/17 1032 11/16/17 1045 11/16/17 1100  BP: (!) 170/59 131/61 133/61 (!) 149/66  Pulse: 73 96 95 99  Resp: 18 18 17 12   Temp: 98.5 F (36.9 C) 98.8 F (37.1 C)    TempSrc: Axillary     SpO2: 96% 97% 94% 94%  Weight:      Height:        Intake/Output Summary (Last 24 hours) at 11/16/2017 1124 Last data filed at 11/16/2017 1035 Gross per 24 hour  Intake 5495 ml  Output 1205 ml  Net 4290 ml   Weight change:   Physical Exam  Constitutional: Appears well-developed and well-nourished. No distress.  CVS: RRR, S1/S2 +, Pulmonary: crackles at bases, no wheezing  Abdominal: Soft. BS +,  no distension, tenderness, rebound or guarding.  Musculoskeletal: +1 LE edema, palpable pulses  Neuro: Alert. No cranial nerve deficit. Skin: Skin is warm and dry.  Psychiatric: Normal mood and affect.    Data Reviewed: I have personally reviewed following labs and imaging studies Basic Metabolic Panel: Recent Labs  Lab 11/12/17 1820 11/13/17 0418 11/14/17 1102 11/15/17 1425 11/16/17 0514  NA 118* 120* 124* 125* 127*  K 4.6 4.2 3.8 3.8 3.9  CL 91* 95* 101 102 103  CO2 22 19* 19* 18* 20*  GLUCOSE 109* 105* 96 128* 116*  BUN 16 17 15 13 16   CREATININE 0.75 0.77 0.80 0.72 0.73  CALCIUM 8.0* 7.7* 7.6* 7.5* 7.6*  MG  --   --   --   --  1.5*   Liver Function Tests: No results for input(s): AST, ALT, ALKPHOS, BILITOT, PROT, ALBUMIN in the last 168 hours. No results for input(s): LIPASE, AMYLASE in the last 168 hours. No results for input(s): AMMONIA in the last 168 hours. Coagulation Profile: Recent Labs  Lab 11/12/17 1910  INR 1.22   CBC: Recent Labs  Lab 11/12/17 1820 11/15/17 1425 11/16/17 0514  WBC 9.9 4.1 5.0  NEUTROABS 8.1*  --   --   HGB  8.4* 9.1* 9.5*  HCT 23.5* 25.2* 26.2*  MCV 92.2 90.6 90.3  PLT 325 307 316   Cardiac Enzymes: No results for input(s): CKTOTAL, CKMB, CKMBINDEX, TROPONINI in the last 168 hours. BNP: Invalid input(s): POCBNP CBG: No results for input(s): GLUCAP in the last 168 hours. HbA1C: No results for input(s): HGBA1C in the last 72 hours. Urine analysis:    Component Value Date/Time   COLORURINE YELLOW 11/13/2017 0240   APPEARANCEUR CLEAR 11/13/2017 0240   LABSPEC 1.015 11/13/2017 0240   PHURINE 6.0 11/13/2017 0240   GLUCOSEU NEGATIVE 11/13/2017 0240   HGBUR NEGATIVE 11/13/2017 0240   HGBUR negative 10/11/2009 0819   BILIRUBINUR NEGATIVE 11/13/2017 0240   BILIRUBINUR n 04/18/2016 1152   KETONESUR 5 (A) 11/13/2017 0240   PROTEINUR NEGATIVE 11/13/2017 0240   UROBILINOGEN 1.0 04/18/2016 1152   UROBILINOGEN 0.2 10/11/2009 0819   NITRITE NEGATIVE 11/13/2017 0240   LEUKOCYTESUR NEGATIVE 11/13/2017 0240   Sepsis Labs: @LABRCNTIP (procalcitonin:4,lacticidven:4) ) Recent Results (from the past 240 hour(s))  Surgical PCR screen     Status: None   Collection Time: 11/12/17 11:20 PM  Result Value Ref Range Status   MRSA, PCR NEGATIVE NEGATIVE Final   Staphylococcus aureus NEGATIVE NEGATIVE Final    Comment: (NOTE) The Xpert SA Assay (FDA approved for NASAL specimens in patients 59 years of age and older), is one component of a comprehensive surveillance program. It is not intended to diagnose infection nor to guide or monitor treatment.      Scheduled Meds: . [MAR Hold] acidophilus  1 capsule Oral Daily  . [MAR Hold] amLODipine  5 mg Oral Daily  . [MAR Hold] atorvastatin  20 mg Oral q1800  . [MAR Hold] carbidopa-levodopa  0.5 tablet Oral TID  . [MAR Hold] docusate sodium  100 mg Oral BID  . [MAR Hold] dorzolamide  1 drop Both Eyes BID  . [MAR Hold] doxazosin  4 mg Oral QHS  . [MAR Hold] latanoprost  1 drop Both Eyes QHS  . [MAR Hold] memantine  10 mg Oral BID  . [MAR Hold]  multivitamin with minerals  1 tablet Oral Daily  . [MAR Hold] pantoprazole  40 mg Oral Daily  . povidone-iodine  2 application Topical Once  . [MAR Hold] vitamin B-6  250 mg Oral Daily  . [MAR Hold] sodium chloride  1 g Oral BID WC  . [MAR Hold] cyanocobalamin  100 mcg Oral Daily   Continuous Infusions: . sodium chloride 125 mL/hr at 11/16/17 1110  . acetaminophen    . methocarbamol (ROBAXIN)  IV 500 mg (11/16/17 1110)    Procedures/Studies: Dg Lumbar Spine Complete  Result Date: 11/12/2017 CLINICAL DATA:  Unwitnessed fall EXAM: LUMBAR SPINE - COMPLETE 4+ VIEW COMPARISON:  CT 09/04/2017 FINDINGS: Mild scoliosis of the lumbar spine, apex left. Extensive atherosclerotic vascular disease of the aorta and branch vessels with calcified aneurysm of the mid to distal abdominal aorta, measuring up to 4.7 cm anterior to posterior. Vertebral body heights appear maintained. Multilevel moderate to marked  degenerative disc changes. Surgical hardware posterior to L4-L5. IMPRESSION: 1. Scoliosis of the lumbar spine with advanced degenerative change. No acute osseous abnormality 2. Calcified abdominal aortic aneurysm, measures up to 4.8 cm maximum AP, compared with 35 mm on the comparison CT ; follow-up CTA would more accurately characterize the aneurysm and assess for true interval change in size Electronically Signed   By: Donavan Foil M.D.   On: 11/12/2017 18:38   Ct Head Wo Contrast  Result Date: 11/12/2017 CLINICAL DATA:  Unwitnessed fall. EXAM: CT HEAD WITHOUT CONTRAST CT CERVICAL SPINE WITHOUT CONTRAST TECHNIQUE: Multidetector CT imaging of the head and cervical spine was performed following the standard protocol without intravenous contrast. Multiplanar CT image reconstructions of the cervical spine were also generated. COMPARISON:  None. FINDINGS: CT HEAD FINDINGS Brain: Mild chronic ischemic white matter disease is noted. No mass effect or midline shift is noted. Ventricular size is within normal  limits. There is no evidence of mass lesion, hemorrhage or acute infarction. Vascular: No hyperdense vessel or unexpected calcification. Skull: Normal. Negative for fracture or focal lesion. Sinuses/Orbits: No acute finding. Other: None. CT CERVICAL SPINE FINDINGS Alignment: Mild reversal of normal lordosis is noted most likely due degenerative change. Skull base and vertebrae: No acute fracture. No primary bone lesion or focal pathologic process. Soft tissues and spinal canal: No prevertebral fluid or swelling. No visible canal hematoma. Disc levels: Severe degenerative disc disease is noted at C3-4, C4-5, C5-6, C6-7 and C7-T1. Upper chest: Negative. Other: Mild degenerative changes are seen involving posterior facet joints bilaterally. IMPRESSION: Mild chronic ischemic white matter disease. No acute intracranial abnormality seen. Severe multilevel degenerative disc disease. No acute abnormality seen in the cervical spine. Electronically Signed   By: Marijo Conception, M.D.   On: 11/12/2017 18:15   Ct Cervical Spine Wo Contrast  Result Date: 11/12/2017 CLINICAL DATA:  Unwitnessed fall. EXAM: CT HEAD WITHOUT CONTRAST CT CERVICAL SPINE WITHOUT CONTRAST TECHNIQUE: Multidetector CT imaging of the head and cervical spine was performed following the standard protocol without intravenous contrast. Multiplanar CT image reconstructions of the cervical spine were also generated. COMPARISON:  None. FINDINGS: CT HEAD FINDINGS Brain: Mild chronic ischemic white matter disease is noted. No mass effect or midline shift is noted. Ventricular size is within normal limits. There is no evidence of mass lesion, hemorrhage or acute infarction. Vascular: No hyperdense vessel or unexpected calcification. Skull: Normal. Negative for fracture or focal lesion. Sinuses/Orbits: No acute finding. Other: None. CT CERVICAL SPINE FINDINGS Alignment: Mild reversal of normal lordosis is noted most likely due degenerative change. Skull base and  vertebrae: No acute fracture. No primary bone lesion or focal pathologic process. Soft tissues and spinal canal: No prevertebral fluid or swelling. No visible canal hematoma. Disc levels: Severe degenerative disc disease is noted at C3-4, C4-5, C5-6, C6-7 and C7-T1. Upper chest: Negative. Other: Mild degenerative changes are seen involving posterior facet joints bilaterally. IMPRESSION: Mild chronic ischemic white matter disease. No acute intracranial abnormality seen. Severe multilevel degenerative disc disease. No acute abnormality seen in the cervical spine. Electronically Signed   By: Marijo Conception, M.D.   On: 11/12/2017 18:15   Dg Pelvis Portable  Result Date: 11/16/2017 CLINICAL DATA:  Status post hip arthroplasty EXAM: PORTABLE PELVIS 1-2 VIEWS COMPARISON:  11/12/2017 FINDINGS: Right hip hemiarthroplasty is anatomically aligned. No breakage or loosening of the hardware. No acute fracture. Osteopenia. L4-5 spinous process metallic stabilizer is stable in position. Vascular calcifications are noted. IMPRESSION: Status post right  hip hemiarthroplasty is anatomically aligned. Electronically Signed   By: Marybelle Killings M.D.   On: 11/16/2017 11:09   Chest Portable 1 View  Result Date: 11/12/2017 CLINICAL DATA:  Preop EXAM: PORTABLE CHEST 1 VIEW COMPARISON:  Radiographs 11/12/2017, chest x-ray 09/04/2017, 04/26/2016. FINDINGS: Right azygos lobe. Stable elevation of left diaphragm with minimal basilar atelectasis. Mild cardiomegaly with aortic atherosclerosis. No pneumothorax. Multiple old left upper rib fractures. IMPRESSION: Elevation of the left diaphragm as before with patchy basilar atelectasis. Cardiomegaly without edema or focal pulmonary infiltrate Electronically Signed   By: Donavan Foil M.D.   On: 11/12/2017 20:57   Dg Hips Bilat W Or Wo Pelvis 3-4 Views  Result Date: 11/12/2017 CLINICAL DATA:  Unwitnessed fall EXAM: DG HIP (WITH OR WITHOUT PELVIS) 3-4V BILAT COMPARISON:  CT 09/04/2017  FINDINGS: Surgical hardware at the lumbar spine. SI joints are not widened. Vascular calcifications. Slightly limited positioning of the left hip with trochanters superimposed over the femoral neck. No obvious fracture is seen. Acute right femoral neck fracture with superior migration of the distal femur. No femoral head dislocation. Extensive vascular calcifications. IMPRESSION: 1. Acute right femoral neck fracture.  No femoral head dislocation 2. No definite acute osseous abnormality of the left hip Electronically Signed   By: Donavan Foil M.D.   On: 11/12/2017 18:32    Leisa Lenz, MD [facility] Pager [phone]  If 7PM-7AM, please contact night-coverage [email or web address] Password [email or web address] 11/16/2017, 11:24 AM   LOS: 4 days

## 2017-11-17 LAB — CBC
HEMATOCRIT: 24.5 % — AB (ref 39.0–52.0)
Hemoglobin: 8.8 g/dL — ABNORMAL LOW (ref 13.0–17.0)
MCH: 32.1 pg (ref 26.0–34.0)
MCHC: 35.9 g/dL (ref 30.0–36.0)
MCV: 89.4 fL (ref 78.0–100.0)
Platelets: 298 10*3/uL (ref 150–400)
RBC: 2.74 MIL/uL — ABNORMAL LOW (ref 4.22–5.81)
RDW: 18.4 % — AB (ref 11.5–15.5)
WBC: 5 10*3/uL (ref 4.0–10.5)

## 2017-11-17 LAB — BASIC METABOLIC PANEL
Anion gap: 4 — ABNORMAL LOW (ref 5–15)
BUN: 16 mg/dL (ref 6–20)
CALCIUM: 7.6 mg/dL — AB (ref 8.9–10.3)
CO2: 21 mmol/L — AB (ref 22–32)
CREATININE: 0.75 mg/dL (ref 0.61–1.24)
Chloride: 105 mmol/L (ref 101–111)
GFR calc non Af Amer: >60 mL/min (ref >60)
Glucose, Bld: 114 mg/dL — ABNORMAL HIGH (ref 65–99)
Potassium: 3.9 mmol/L (ref 3.5–5.1)
Sodium: 130 mmol/L — ABNORMAL LOW (ref 135–145)

## 2017-11-17 MED ORDER — MAGNESIUM SULFATE 2 GM/50ML IV SOLN
2.0000 g | Freq: Once | INTRAVENOUS | Status: AC
  Administered 2017-11-17: 2 g via INTRAVENOUS
  Filled 2017-11-17: qty 50

## 2017-11-17 NOTE — Progress Notes (Signed)
PROGRESS NOTE  Alexander Rice INO:[id number] DOB: [birth year] DOA: 11/12/2017 PCP: Marin Olp, MD  Brief History:  83 year old male with a history of Parkinson's disease, dementia, myelodysplastic syndrome, hypertension, coronary artery disease presented to the emergency department after a fall on 11/11/2017.  Apparently EMS was activated, and the patient was helped back to the chair and not brought to the emergency department at that time.  Since then, the patient continued to have severe right hip pain with difficulty getting up.  As result, the patient was subsequently brought to the emergency department on 11/12/2017.  X-rays revealed an acute right femoral neck fracture.  Orthopedics was consulted and there are tentative plans for right hip  Hemiarthroplasty.  Unfortunately, surgery has been delayed secondary to the patient's profound hyponatremia.  At the time of admission, the patient was noted to have a serum sodium 118.  There is no history of any new medications, vomiting, or diarrhea.  The patient was started on intravenous fluids with slow improvement.  Assessment/Plan:  Right femoral neck fracture - S/P Arthroplasty Bipolar Hip 11/16/2017  - Appreciate ortho following  - Continue pain management efforts - PT eval   Hyponatremia - Secondary to dehydration - Sodium 130 this morning  Essential hypertension - Continue Norvasc 5 mg daily and Cardura 4 mg at bedtime  Myelodysplastic syndrome/refractory anemia with ring sideroblasts - Transfused 2 units PRBC 11/14/2017 and patient also has received 2 units of PRBC transfusion 11/08/2017 - Baseline hemoglobin is 9 - Monitor CBC daily  Parkinson's disease - Continue Sinemet  - Stable   Dementia without behavioral disturbance  - Continue Namenda  - Stable   Hyperlipidemia - Continue Lipitor 20 mg at bedtime   Hypomagnesemia - Supplement - Follow up Magnesium level in am  DVT Prophylaxis:  SCD's Code  Status:  FULL Family Communication: No family at bedside  Disposition Plan:  Once cleared by ortho   Consultants:   Ortho  Antibiotics:  None  Subjective: No overnight events.    Objective: Vitals:   11/16/17 2217 11/17/17 0201 11/17/17 0640 11/17/17 1000  BP: (!) 146/55 (!) 131/52 (!) 151/60 (!) 147/59  Pulse: 81 78 71 70  Resp: 15 17 18 17   Temp: 98 F (36.7 C) (!) 97.4 F (36.3 C) 98.8 F (37.1 C) 98 F (36.7 C)  TempSrc: Oral Oral Oral Oral  SpO2: 95% 95% 97% 96%  Weight:      Height:        Intake/Output Summary (Last 24 hours) at 11/17/2017 1107 Last data filed at 11/17/2017 0962 Gross per 24 hour  Intake 3522.29 ml  Output 800 ml  Net 2722.29 ml   Weight change:   Physical Exam  Constitutional: Appears well-developed and well-nourished. No distress.  CVS: RRR, S1/S2 + Pulmonary: Effort and breath sounds normal, no stridor, rhonchi, wheezes, rales.  Abdominal: Soft. BS +,  no distension, tenderness, rebound or guarding.  Musculoskeletal: no edema, palpable pulses  Lymphadenopathy: No lymphadenopathy noted, cervical, inguinal. Neuro: Alert. No cranial nerve deficit. Skin: Skin is warm and dry.  Psychiatric: Normal mood and affect. Behavior, judgment, thought content normal.   Data Reviewed: I have personally reviewed following labs and imaging studies  Basic Metabolic Panel: Recent Labs  Lab 11/13/17 0418 11/14/17 1102 11/15/17 1425 11/16/17 0514 11/17/17 0534  NA 120* 124* 125* 127* 130*  K 4.2 3.8 3.8 3.9 3.9  CL 95* 101 102 103 105  CO2 19*  19* 18* 20* 21*  GLUCOSE 105* 96 128* 116* 114*  BUN 17 15 13 16 16   CREATININE 0.77 0.80 0.72 0.73 0.75  CALCIUM 7.7* 7.6* 7.5* 7.6* 7.6*  MG  --   --   --  1.5*  --    Liver Function Tests: No results for input(s): AST, ALT, ALKPHOS, BILITOT, PROT, ALBUMIN in the last 168 hours. No results for input(s): LIPASE, AMYLASE in the last 168 hours. No results for input(s): AMMONIA in the last 168  hours. Coagulation Profile: Recent Labs  Lab 11/12/17 1910  INR 1.22   CBC: Recent Labs  Lab 11/12/17 1820 11/15/17 1425 11/16/17 0514 11/17/17 0534  WBC 9.9 4.1 5.0 5.0  NEUTROABS 8.1*  --   --   --   HGB 8.4* 9.1* 9.5* 8.8*  HCT 23.5* 25.2* 26.2* 24.5*  MCV 92.2 90.6 90.3 89.4  PLT 325 307 316 298   Cardiac Enzymes: No results for input(s): CKTOTAL, CKMB, CKMBINDEX, TROPONINI in the last 168 hours. BNP: Invalid input(s): POCBNP CBG: No results for input(s): GLUCAP in the last 168 hours. HbA1C: No results for input(s): HGBA1C in the last 72 hours. Urine analysis:    Component Value Date/Time   COLORURINE YELLOW 11/13/2017 0240   APPEARANCEUR CLEAR 11/13/2017 0240   LABSPEC 1.015 11/13/2017 0240   PHURINE 6.0 11/13/2017 0240   GLUCOSEU NEGATIVE 11/13/2017 0240   HGBUR NEGATIVE 11/13/2017 0240   HGBUR negative 10/11/2009 0819   BILIRUBINUR NEGATIVE 11/13/2017 0240   BILIRUBINUR n 04/18/2016 1152   KETONESUR 5 (A) 11/13/2017 0240   PROTEINUR NEGATIVE 11/13/2017 0240   UROBILINOGEN 1.0 04/18/2016 1152   UROBILINOGEN 0.2 10/11/2009 0819   NITRITE NEGATIVE 11/13/2017 0240   LEUKOCYTESUR NEGATIVE 11/13/2017 0240   Sepsis Labs: @LABRCNTIP (procalcitonin:4,lacticidven:4) ) Recent Results (from the past 240 hour(s))  Surgical PCR screen     Status: None   Collection Time: 11/12/17 11:20 PM  Result Value Ref Range Status   MRSA, PCR NEGATIVE NEGATIVE Final   Staphylococcus aureus NEGATIVE NEGATIVE Final    Comment: (NOTE) The Xpert SA Assay (FDA approved for NASAL specimens in patients 50 years of age and older), is one component of a comprehensive surveillance program. It is not intended to diagnose infection nor to guide or monitor treatment.      Scheduled Meds: . acidophilus  1 capsule Oral Daily  . amLODipine  5 mg Oral Daily  . atorvastatin  20 mg Oral q1800  . carbidopa-levodopa  0.5 tablet Oral TID  . docusate sodium  100 mg Oral BID  . dorzolamide   1 drop Both Eyes BID  . doxazosin  4 mg Oral QHS  . latanoprost  1 drop Both Eyes QHS  . memantine  10 mg Oral BID  . multivitamin with minerals  1 tablet Oral Daily  . pantoprazole  40 mg Oral Daily  . vitamin B-6  250 mg Oral Daily  . sodium chloride  1 g Oral BID WC  . cyanocobalamin  100 mcg Oral Daily   Continuous Infusions: . sodium chloride 125 mL/hr at 11/17/17 8315    Procedures/Studies: Dg Lumbar Spine Complete  Result Date: 11/12/2017 CLINICAL DATA:  Unwitnessed fall EXAM: LUMBAR SPINE - COMPLETE 4+ VIEW COMPARISON:  CT 09/04/2017 FINDINGS: Mild scoliosis of the lumbar spine, apex left. Extensive atherosclerotic vascular disease of the aorta and branch vessels with calcified aneurysm of the mid to distal abdominal aorta, measuring up to 4.7 cm anterior to posterior. Vertebral body heights appear  maintained. Multilevel moderate to marked degenerative disc changes. Surgical hardware posterior to L4-L5. IMPRESSION: 1. Scoliosis of the lumbar spine with advanced degenerative change. No acute osseous abnormality 2. Calcified abdominal aortic aneurysm, measures up to 4.8 cm maximum AP, compared with 35 mm on the comparison CT ; follow-up CTA would more accurately characterize the aneurysm and assess for true interval change in size Electronically Signed   By: Donavan Foil M.D.   On: 11/12/2017 18:38   Ct Head Wo Contrast  Result Date: 11/12/2017 CLINICAL DATA:  Unwitnessed fall. EXAM: CT HEAD WITHOUT CONTRAST CT CERVICAL SPINE WITHOUT CONTRAST TECHNIQUE: Multidetector CT imaging of the head and cervical spine was performed following the standard protocol without intravenous contrast. Multiplanar CT image reconstructions of the cervical spine were also generated. COMPARISON:  None. FINDINGS: CT HEAD FINDINGS Brain: Mild chronic ischemic white matter disease is noted. No mass effect or midline shift is noted. Ventricular size is within normal limits. There is no evidence of mass lesion,  hemorrhage or acute infarction. Vascular: No hyperdense vessel or unexpected calcification. Skull: Normal. Negative for fracture or focal lesion. Sinuses/Orbits: No acute finding. Other: None. CT CERVICAL SPINE FINDINGS Alignment: Mild reversal of normal lordosis is noted most likely due degenerative change. Skull base and vertebrae: No acute fracture. No primary bone lesion or focal pathologic process. Soft tissues and spinal canal: No prevertebral fluid or swelling. No visible canal hematoma. Disc levels: Severe degenerative disc disease is noted at C3-4, C4-5, C5-6, C6-7 and C7-T1. Upper chest: Negative. Other: Mild degenerative changes are seen involving posterior facet joints bilaterally. IMPRESSION: Mild chronic ischemic white matter disease. No acute intracranial abnormality seen. Severe multilevel degenerative disc disease. No acute abnormality seen in the cervical spine. Electronically Signed   By: Marijo Conception, M.D.   On: 11/12/2017 18:15   Ct Cervical Spine Wo Contrast  Result Date: 11/12/2017 CLINICAL DATA:  Unwitnessed fall. EXAM: CT HEAD WITHOUT CONTRAST CT CERVICAL SPINE WITHOUT CONTRAST TECHNIQUE: Multidetector CT imaging of the head and cervical spine was performed following the standard protocol without intravenous contrast. Multiplanar CT image reconstructions of the cervical spine were also generated. COMPARISON:  None. FINDINGS: CT HEAD FINDINGS Brain: Mild chronic ischemic white matter disease is noted. No mass effect or midline shift is noted. Ventricular size is within normal limits. There is no evidence of mass lesion, hemorrhage or acute infarction. Vascular: No hyperdense vessel or unexpected calcification. Skull: Normal. Negative for fracture or focal lesion. Sinuses/Orbits: No acute finding. Other: None. CT CERVICAL SPINE FINDINGS Alignment: Mild reversal of normal lordosis is noted most likely due degenerative change. Skull base and vertebrae: No acute fracture. No primary bone  lesion or focal pathologic process. Soft tissues and spinal canal: No prevertebral fluid or swelling. No visible canal hematoma. Disc levels: Severe degenerative disc disease is noted at C3-4, C4-5, C5-6, C6-7 and C7-T1. Upper chest: Negative. Other: Mild degenerative changes are seen involving posterior facet joints bilaterally. IMPRESSION: Mild chronic ischemic white matter disease. No acute intracranial abnormality seen. Severe multilevel degenerative disc disease. No acute abnormality seen in the cervical spine. Electronically Signed   By: Marijo Conception, M.D.   On: 11/12/2017 18:15   Dg Pelvis Portable  Result Date: 11/16/2017 CLINICAL DATA:  Status post hip arthroplasty EXAM: PORTABLE PELVIS 1-2 VIEWS COMPARISON:  11/12/2017 FINDINGS: Right hip hemiarthroplasty is anatomically aligned. No breakage or loosening of the hardware. No acute fracture. Osteopenia. L4-5 spinous process metallic stabilizer is stable in position. Vascular calcifications are  noted. IMPRESSION: Status post right hip hemiarthroplasty is anatomically aligned. Electronically Signed   By: Marybelle Killings M.D.   On: 11/16/2017 11:09   Chest Portable 1 View  Result Date: 11/12/2017 CLINICAL DATA:  Preop EXAM: PORTABLE CHEST 1 VIEW COMPARISON:  Radiographs 11/12/2017, chest x-ray 09/04/2017, 04/26/2016. FINDINGS: Right azygos lobe. Stable elevation of left diaphragm with minimal basilar atelectasis. Mild cardiomegaly with aortic atherosclerosis. No pneumothorax. Multiple old left upper rib fractures. IMPRESSION: Elevation of the left diaphragm as before with patchy basilar atelectasis. Cardiomegaly without edema or focal pulmonary infiltrate Electronically Signed   By: Donavan Foil M.D.   On: 11/12/2017 20:57   Dg Hips Bilat W Or Wo Pelvis 3-4 Views  Result Date: 11/12/2017 CLINICAL DATA:  Unwitnessed fall EXAM: DG HIP (WITH OR WITHOUT PELVIS) 3-4V BILAT COMPARISON:  CT 09/04/2017 FINDINGS: Surgical hardware at the lumbar spine. SI  joints are not widened. Vascular calcifications. Slightly limited positioning of the left hip with trochanters superimposed over the femoral neck. No obvious fracture is seen. Acute right femoral neck fracture with superior migration of the distal femur. No femoral head dislocation. Extensive vascular calcifications. IMPRESSION: 1. Acute right femoral neck fracture.  No femoral head dislocation 2. No definite acute osseous abnormality of the left hip Electronically Signed   By: Donavan Foil M.D.   On: 11/12/2017 18:32    Leisa Lenz, MD [facility] Pager [phone]  If 7PM-7AM, please contact night-coverage [email or web address] Password [email or web address] 11/17/2017, 11:07 AM   LOS: 5 days

## 2017-11-17 NOTE — Progress Notes (Signed)
Subjective: 1 Day Post-Op Procedure(s) (LRB): ARTHROPLASTY BIPOLAR HIP (HEMIARTHROPLASTY) RIGHT (Right) Patient reports pain as 3 on 0-10 scale.    Objective: Vital signs in last 24 hours: Temp:  [97.4 F (36.3 C)-98.8 F (37.1 C)] 98.8 F (37.1 C) (12/30 0640) Pulse Rate:  [71-99] 71 (12/30 0640) Resp:  [11-18] 18 (12/30 0640) BP: (131-160)/(52-85) 151/60 (12/30 0640) SpO2:  [92 %-98 %] 97 % (12/30 0640)  Intake/Output from previous day: 12/29 0701 - 12/30 0700 In: 4507.3 [P.O.:420; I.V.:3372.3; Blood:315; IV Piggyback:400] Out: 1100 [Urine:800; Blood:300] Intake/Output this shift: No intake/output data recorded.  Recent Labs    11/15/17 1425 11/16/17 0514 11/17/17 0534  HGB 9.1* 9.5* 8.8*   Recent Labs    11/16/17 0514 11/17/17 0534  WBC 5.0 5.0  RBC 2.90* 2.74*  HCT 26.2* 24.5*  PLT 316 298   Recent Labs    11/16/17 0514 11/17/17 0534  NA 127* 130*  K 3.9 3.9  CL 103 105  CO2 20* 21*  BUN 16 16  CREATININE 0.73 0.75  GLUCOSE 116* 114*  CALCIUM 7.6* 7.6*   No results for input(s): LABPT, INR in the last 72 hours.  Neurologically intact Sensation intact distally Intact pulses distally Dorsiflexion/Plantar flexion intact Incision: scant drainage No DVT UE edema NVI  Assessment/Plan: 1 Day Post-Op Procedure(s) (LRB): ARTHROPLASTY BIPOLAR HIP (HEMIARTHROPLASTY) RIGHT (Right) Advance diet Up with therapy  Low Na treating. Better  Alexander Rice C 11/17/2017, 8:03 AM

## 2017-11-17 NOTE — Evaluation (Signed)
Physical Therapy Evaluation Patient Details Name: Alexander Rice MRN: [id number] DOB: [birth year] Today's Date: 11/17/2017   History of Present Illness  -year-old male with a history of Parkinson's disease, dementia, myelodysplastic syndrome, hypertension, coronary artery disease presented to the emergency department after a fall on 11/11/2017, EMS activated and assisted into recliner. On 12/25, HHRN noticed increased pain. Brought to ED. and found to have ritght hip fracture. S/P hemiarthroplasty 11/16/17  Clinical Impression  The patient  Requires 2 assist for mobility, keeps legs in flexed posture. Unable to attempt standing today. Pt admitted with above diagnosis. Pt currently with functional limitations due to the deficits listed below (see PT Problem List). Pt will benefit from skilled PT to increase their independence and safety with mobility to allow discharge to the venue listed below.      Follow Up Recommendations SNF    Equipment Recommendations  None recommended by PT    Recommendations for Other Services       Precautions / Restrictions Precautions Precautions: Fall;Posterior Hip Restrictions RLE Weight Bearing: Weight bearing as tolerated      Mobility  Bed Mobility Overal bed mobility: Needs Assistance Bed Mobility: Sidelying to Sit;Sit to Sidelying   Sidelying to sit: Total assist;+2 for physical assistance;+2 for safety/equipment;HOB elevated     Sit to sidelying: Total assist;+2 for physical assistance;+2 for safety/equipment General bed mobility comments: assist with legss and trunk, use of bed pad.  Transfers                 General transfer comment: NT  Ambulation/Gait                Stairs            Wheelchair Mobility    Modified Rankin (Stroke Patients Only)       Balance Overall balance assessment: History of Falls;Needs assistance Sitting-balance support: No upper extremity supported;Feet unsupported Sitting  balance-Leahy Scale: Poor                                       Pertinent Vitals/Pain Pain Assessment: Faces Faces Pain Scale: Hurts even more Pain Location: right hip when mobilized Pain Descriptors / Indicators: Discomfort;Grimacing;Guarding;Moaning Pain Intervention(s): Monitored during session;Repositioned;Ice applied    Home Living Family/patient expects to be discharged to:: Skilled nursing facility                      Prior Function Level of Independence: Needs assistance   Gait / Transfers Assistance Needed: daughter reports limited ambulation recently with RW.           Hand Dominance        Extremity/Trunk Assessment   Upper Extremity Assessment Upper Extremity Assessment: RUE deficits/detail;LUE deficits/detail RUE Deficits / Details: rigid with attempts for rom, noted shaking. LUE Deficits / Details: same as right    Lower Extremity Assessment Lower Extremity Assessment: RLE deficits/detail;LLE deficits/detail RLE Deficits / Details: decreased  tolerance to stretchuing the hip and knee, keeps flexed postition RLE: Unable to fully assess due to pain    Cervical / Trunk Assessment Cervical / Trunk Assessment: Kyphotic  Communication      Cognition Arousal/Alertness: Awake/alert Behavior During Therapy: WFL for tasks assessed/performed Overall Cognitive Status: History of cognitive impairments - at baseline  General Comments      Exercises     Assessment/Plan    PT Assessment Patient needs continued PT services  PT Problem List Decreased strength;Decreased cognition;Decreased range of motion;Impaired tone;Decreased knowledge of use of DME;Decreased activity tolerance;Decreased safety awareness;Decreased balance;Decreased knowledge of precautions;Decreased mobility       PT Treatment Interventions DME instruction;Functional mobility training;Therapeutic  activities;Therapeutic exercise;Patient/family education    PT Goals (Current goals can be found in the Care Plan section)  Acute Rehab PT Goals Patient Stated Goal: go to rehab PT Goal Formulation: With family Time For Goal Achievement: 12/01/17 Potential to Achieve Goals: Fair    Frequency Min 2X/week   Barriers to discharge Decreased caregiver support      Co-evaluation               AM-PAC PT "6 Clicks" Daily Activity  Outcome Measure Difficulty turning over in bed (including adjusting bedclothes, sheets and blankets)?: Unable Difficulty moving from lying on back to sitting on the side of the bed? : Unable Difficulty sitting down on and standing up from a chair with arms (e.g., wheelchair, bedside commode, etc,.)?: Unable Help needed moving to and from a bed to chair (including a wheelchair)?: Total Help needed walking in hospital room?: Total Help needed climbing 3-5 steps with a railing? : Total 6 Click Score: 6    End of Session   Activity Tolerance: Patient tolerated treatment well Patient left: with call bell/phone within reach;with bed alarm set;with family/visitor present Nurse Communication: Mobility status PT Visit Diagnosis: Unsteadiness on feet (R26.81);History of falling (Z91.81)    Time: 4315-4008 PT Time Calculation (min) (ACUTE ONLY): 14 min   Charges:   PT Evaluation $PT Eval Low Complexity: 1 Low     PT G Codes:        {Willett Lefeber PT [phone]   Claretha Cooper 11/17/2017, 4:40 PM

## 2017-11-18 ENCOUNTER — Inpatient Hospital Stay (HOSPITAL_COMMUNITY): Payer: Medicare Other

## 2017-11-18 ENCOUNTER — Encounter (HOSPITAL_COMMUNITY): Payer: Self-pay | Admitting: Orthopedic Surgery

## 2017-11-18 LAB — MAGNESIUM: MAGNESIUM: 1.8 mg/dL (ref 1.7–2.4)

## 2017-11-18 LAB — BASIC METABOLIC PANEL
Anion gap: 3 — ABNORMAL LOW (ref 5–15)
BUN: 15 mg/dL (ref 6–20)
CHLORIDE: 107 mmol/L (ref 101–111)
CO2: 19 mmol/L — ABNORMAL LOW (ref 22–32)
CREATININE: 0.64 mg/dL (ref 0.61–1.24)
Calcium: 7.3 mg/dL — ABNORMAL LOW (ref 8.9–10.3)
GFR calc Af Amer: >60 mL/min (ref >60)
GLUCOSE: 99 mg/dL (ref 65–99)
Potassium: 3.9 mmol/L (ref 3.5–5.1)
SODIUM: 129 mmol/L — AB (ref 135–145)

## 2017-11-18 LAB — PREPARE RBC (CROSSMATCH)

## 2017-11-18 LAB — CBC
HCT: 21.7 % — ABNORMAL LOW (ref 39.0–52.0)
Hemoglobin: 7.6 g/dL — ABNORMAL LOW (ref 13.0–17.0)
MCH: 31.8 pg (ref 26.0–34.0)
MCHC: 35 g/dL (ref 30.0–36.0)
MCV: 90.8 fL (ref 78.0–100.0)
PLATELETS: 274 10*3/uL (ref 150–400)
RBC: 2.39 MIL/uL — ABNORMAL LOW (ref 4.22–5.81)
RDW: 18.1 % — ABNORMAL HIGH (ref 11.5–15.5)
WBC: 8.2 10*3/uL (ref 4.0–10.5)

## 2017-11-18 MED ORDER — SENNA 8.6 MG PO TABS
1.0000 | ORAL_TABLET | Freq: Every day | ORAL | Status: DC
  Administered 2017-11-18 – 2017-11-19 (×2): 8.6 mg via ORAL
  Filled 2017-11-18 (×2): qty 1

## 2017-11-18 MED ORDER — SODIUM CHLORIDE 0.9 % IV SOLN
Freq: Once | INTRAVENOUS | Status: AC
  Administered 2017-11-18: 13:00:00 via INTRAVENOUS

## 2017-11-18 MED ORDER — FUROSEMIDE 10 MG/ML IJ SOLN
20.0000 mg | Freq: Once | INTRAMUSCULAR | Status: AC
  Administered 2017-11-18: 20 mg via INTRAVENOUS
  Filled 2017-11-18: qty 2

## 2017-11-18 MED ORDER — ASPIRIN 81 MG PO TABS: 81.0000 mg | ORAL_TABLET | Freq: Two times a day (BID) | ORAL | 0 refills | Status: DC

## 2017-11-18 MED ORDER — HYDROCODONE-ACETAMINOPHEN 5-325 MG PO TABS: 1.0000 | ORAL_TABLET | Freq: Four times a day (QID) | ORAL | 0 refills | Status: DC | PRN

## 2017-11-18 NOTE — Progress Notes (Signed)
OT Cancellation Note  Patient Details Name: Alexander Rice MRN: [id number] DOB: [birth year]   Cancelled Treatment:    Noted plans for SNF- will defer to SNF [facility], OT [phone]  [name] D 11/18/2017, 12:52 PM

## 2017-11-18 NOTE — Progress Notes (Signed)
Physical Therapy Treatment Patient Details Name: Alexander Rice MRN: [id number] DOB: January 31, 1932 Today's Date: 11/18/2017    History of Present Illness -year-old male with a history of Parkinson's disease, dementia, myelodysplastic syndrome, hypertension, coronary artery disease presented to the emergency department after a fall on 11/11/2017, EMS activated and assisted into recliner. On 12/25, HHRN noticed increased pain. Brought to ED. and found to have ritght hip fracture. S/P hemiarthroplasty 11/16/17    PT Comments    Assisted to EOB + 2 assist.  Poor forward flex posture.  MAX VC's on THP esp hip flex >90 degrees.  Pt sat EOB x 7 min to increase alertness and cognition to current situation.  Attempted sit to stand twice however pt was unable to clear buttocks of bed.  Pt present with flex hips and knees.  + 2 side by side assist pivot 1/4 partial from elevated bed to recliner.  Positioned to comfort and positioned R LE to THP to prevent internal rotation.    Follow Up Recommendations  SNF     Equipment Recommendations  None recommended by PT    Recommendations for Other Services       Precautions / Restrictions Precautions Precautions: Fall;Posterior Hip Restrictions Weight Bearing Restrictions: No RLE Weight Bearing: Weight bearing as tolerated    Mobility  Bed Mobility Overal bed mobility: Needs Assistance Bed Mobility: Supine to Sit     Supine to sit: Mod assist;Max assist;+2 for physical assistance;+2 for safety/equipment     General bed mobility comments: assist with legss and trunk, use of bed pad.  Transfers Overall transfer level: Needs assistance Equipment used: Rolling walker (2 wheeled) Transfers: Sit to/from Stand Sit to Stand: Total assist;+2 physical assistance;+2 safety/equipment         General transfer comment: attempted sit to stand + 2 assist 2 trials hoever pt unable to rise buttocks off bed.    Ambulation/Gait                  Stairs            Wheelchair Mobility    Modified Rankin (Stroke Patients Only)       Balance                                            Cognition Arousal/Alertness: Awake/alert Behavior During Therapy: WFL for tasks assessed/performed Overall Cognitive Status: History of cognitive impairments - at baseline                                 General Comments: AxO x 2  pleasant   following repeat commands       Exercises      General Comments        Pertinent Vitals/Pain Pain Assessment: No/denies pain    Home Living                      Prior Function            PT Goals (current goals can now be found in the care plan section) Progress towards PT goals: Progressing toward goals    Frequency    Min 2X/week      PT Plan Current plan remains appropriate    Co-evaluation  AM-PAC PT "6 Clicks" Daily Activity  Outcome Measure    Difficulty moving from lying on back to sitting on the side of the bed? : Unable Difficulty sitting down on and standing up from a chair with arms (e.g., wheelchair, bedside commode, etc,.)?: Unable Help needed moving to and from a bed to chair (including a wheelchair)?: Total Help needed walking in hospital room?: Total Help needed climbing 3-5 steps with a railing? : Total 6 Click Score: 5    End of Session Equipment Utilized During Treatment: Gait belt Activity Tolerance: Other (comment)   Nurse Communication: Mobility status;Need for lift equipment PT Visit Diagnosis: Unsteadiness on feet (R26.81);History of falling (Z91.81)     Time: 5320-2334 PT Time Calculation (min) (ACUTE ONLY): 24 min  Charges:  $Therapeutic Activity: 23-37 mins                    G Codes:       Rica Koyanagi  PTA [facility]  Acute  Rehab Pager      [phone]

## 2017-11-18 NOTE — Progress Notes (Addendum)
PROGRESS NOTE  Alexander Rice MHD:[id number] DOB: [birth year] DOA: 11/12/2017 PCP: Marin Olp, MD  Brief History:  83 year old male with a history of Parkinson's disease, dementia, myelodysplastic syndrome, hypertension, coronary artery disease presented to the emergency department after a fall on 11/11/2017.  Apparently EMS was activated, and the patient was helped back to the chair and not brought to the emergency department at that time.  Since then, the patient continued to have severe right hip pain with difficulty getting up.  As result, the patient was subsequently brought to the emergency department on 11/12/2017.  X-rays revealed an acute right femoral neck fracture.  Orthopedics was consulted and there are tentative plans for right hip  Hemiarthroplasty.  Unfortunately, surgery has been delayed secondary to the patient's profound hyponatremia.  At the time of admission, the patient was noted to have a serum sodium 118.  There is no history of any new medications, vomiting, or diarrhea.  The patient was started on intravenous fluids with slow improvement.  Assessment/Plan:  Right femoral neck fracture - S/P Arthroplasty Bipolar Hip 11/16/2017  - Per PT evaluation - SNF recommended - Appreciate SW assistance with discharge planning  - Continue pain management efforts   Wheezing - Wheezing this am - Obtain CXR this am - Stop IV fluids - Give 1 dose of lasix 20 mg IV  Hyponatremia - Chronic usually in range 128-132 - Plasma osm is 263 (decreased), urine osm is 505 - Possibility includes SIADH from acute illness, dehydration - Sodium 130 -- > 129 - Continue salt tablets  - Follow up BMP in am - Pt wheezing more this am so we had to stop IV fluids  Essential hypertension - Continue Norvasc 5 mg daily and Cardura 4 mg at bedtime - BP 130/54 this am   Myelodysplastic syndrome/refractory anemia with ring sideroblasts / Postoperative blood loss anemia  -  Transfused 2 units PRBC 11/14/2017 and patient also has received 2 units of PRBC transfusion 11/08/2017 - Hgb down to 7.6 this am, likely post operative blood loss anemia - Transfuse 1 U PRBC this am   Parkinson's disease - Continue Sinemet   Dementia without behavioral disturbance  - Continue Namenda   Hyperlipidemia - Continue Lipitor 20 mg at bedtime   Hypomagnesemia - Supplemented and WNL this am   Blanchable erythema of coccygeal area - Seen by WOC - Appreciate their assessment   DVT Prophylaxis:  SCD's Code Status:  FULL Family Communication: No family at the bedside this am  Disposition Plan:  To SNF once stable, needs PRBC transfusion this am  Consultants:   PT  Orthopedic surgery, Dr. Susa Day  WOC  Antibiotics:  None   Procedure:  Arthroplasty bipolar hip 11/16/2017   Subjective: No overnight events.   Objective: Vitals:   11/17/17 1345 11/17/17 2219 11/18/17 0517 11/18/17 1056  BP: (!) 149/61 (!) 153/46 (!) 148/58 (!) 130/54  Pulse: 71 78 78 82  Resp: 18 18 20  (!) 22  Temp: 98 F (36.7 C) 98.7 F (37.1 C) 99.5 F (37.5 C)   TempSrc: Oral Oral Oral   SpO2: 96% 97% 95% 99%  Weight:      Height:        Intake/Output Summary (Last 24 hours) at 11/18/2017 1111 Last data filed at 11/18/2017 0931 Gross per 24 hour  Intake 3515 ml  Output 1875 ml  Net 1640 ml   Weight change:   Physical Exam  Constitutional: Appears well-developed and well-nourished. No distress. .  CVS: RRR, S1/S2 + Pulmonary: wheezing in upper lung lobes, no rhonchi  Abdominal: Soft. BS +,  no distension, tenderness, rebound or guarding.  Musculoskeletal: No edema and no tenderness.  Lymphadenopathy: No lymphadenopathy noted, cervical, inguinal. Neuro: Alert. Normal reflexes, muscle tone coordination. No cranial nerve deficit. Skin: erythema coccygeal area Psychiatric: Normal mood and affect. Behavior, judgment, thought content normal.    Data Reviewed: I  have personally reviewed following labs and imaging studies  Basic Metabolic Panel: Recent Labs  Lab 11/14/17 1102 11/15/17 1425 11/16/17 0514 11/17/17 0534 11/18/17 0552  NA 124* 125* 127* 130* 129*  K 3.8 3.8 3.9 3.9 3.9  CL 101 102 103 105 107  CO2 19* 18* 20* 21* 19*  GLUCOSE 96 128* 116* 114* 99  BUN 15 13 16 16 15   CREATININE 0.80 0.72 0.73 0.75 0.64  CALCIUM 7.6* 7.5* 7.6* 7.6* 7.3*  MG  --   --  1.5*  --  1.8   Liver Function Tests: No results for input(s): AST, ALT, ALKPHOS, BILITOT, PROT, ALBUMIN in the last 168 hours. No results for input(s): LIPASE, AMYLASE in the last 168 hours. No results for input(s): AMMONIA in the last 168 hours. Coagulation Profile: Recent Labs  Lab 11/12/17 1910  INR 1.22   CBC: Recent Labs  Lab 11/12/17 1820 11/15/17 1425 11/16/17 0514 11/17/17 0534 11/18/17 0552  WBC 9.9 4.1 5.0 5.0 8.2  NEUTROABS 8.1*  --   --   --   --   HGB 8.4* 9.1* 9.5* 8.8* 7.6*  HCT 23.5* 25.2* 26.2* 24.5* 21.7*  MCV 92.2 90.6 90.3 89.4 90.8  PLT 325 307 316 298 274   Cardiac Enzymes: No results for input(s): CKTOTAL, CKMB, CKMBINDEX, TROPONINI in the last 168 hours. BNP: Invalid input(s): POCBNP CBG: No results for input(s): GLUCAP in the last 168 hours. HbA1C: No results for input(s): HGBA1C in the last 72 hours. Urine analysis:    Component Value Date/Time   COLORURINE YELLOW 11/13/2017 0240   APPEARANCEUR CLEAR 11/13/2017 0240   LABSPEC 1.015 11/13/2017 0240   PHURINE 6.0 11/13/2017 0240   GLUCOSEU NEGATIVE 11/13/2017 0240   HGBUR NEGATIVE 11/13/2017 0240   HGBUR negative 10/11/2009 0819   BILIRUBINUR NEGATIVE 11/13/2017 0240   BILIRUBINUR n 04/18/2016 1152   KETONESUR 5 (A) 11/13/2017 0240   PROTEINUR NEGATIVE 11/13/2017 0240   UROBILINOGEN 1.0 04/18/2016 1152   UROBILINOGEN 0.2 10/11/2009 0819   NITRITE NEGATIVE 11/13/2017 0240   LEUKOCYTESUR NEGATIVE 11/13/2017 0240   Sepsis  Labs: @LABRCNTIP (procalcitonin:4,lacticidven:4) ) Recent Results (from the past 240 hour(s))  Surgical PCR screen     Status: None   Collection Time: 11/12/17 11:20 PM  Result Value Ref Range Status   MRSA, PCR NEGATIVE NEGATIVE Final   Staphylococcus aureus NEGATIVE NEGATIVE Final    Comment: (NOTE) The Xpert SA Assay (FDA approved for NASAL specimens in patients 39 years of age and older), is one component of a comprehensive surveillance program. It is not intended to diagnose infection nor to guide or monitor treatment.      Scheduled Meds: . acidophilus  1 capsule Oral Daily  . amLODipine  5 mg Oral Daily  . atorvastatin  20 mg Oral q1800  . carbidopa-levodopa  0.5 tablet Oral TID  . docusate sodium  100 mg Oral BID  . dorzolamide  1 drop Both Eyes BID  . doxazosin  4 mg Oral QHS  . furosemide  20 mg Intravenous  Once  . latanoprost  1 drop Both Eyes QHS  . memantine  10 mg Oral BID  . multivitamin with minerals  1 tablet Oral Daily  . pantoprazole  40 mg Oral Daily  . vitamin B-6  250 mg Oral Daily  . sodium chloride  1 g Oral BID WC  . cyanocobalamin  100 mcg Oral Daily   Continuous Infusions: . sodium chloride 125 mL/hr at 11/18/17 0545  . sodium chloride      Procedures/Studies: Dg Lumbar Spine Complete  Result Date: 11/12/2017 CLINICAL DATA:  Unwitnessed fall EXAM: LUMBAR SPINE - COMPLETE 4+ VIEW COMPARISON:  CT 09/04/2017 FINDINGS: Mild scoliosis of the lumbar spine, apex left. Extensive atherosclerotic vascular disease of the aorta and branch vessels with calcified aneurysm of the mid to distal abdominal aorta, measuring up to 4.7 cm anterior to posterior. Vertebral body heights appear maintained. Multilevel moderate to marked degenerative disc changes. Surgical hardware posterior to L4-L5. IMPRESSION: 1. Scoliosis of the lumbar spine with advanced degenerative change. No acute osseous abnormality 2. Calcified abdominal aortic aneurysm, measures up to 4.8 cm  maximum AP, compared with 35 mm on the comparison CT ; follow-up CTA would more accurately characterize the aneurysm and assess for true interval change in size Electronically Signed   By: Donavan Foil M.D.   On: 11/12/2017 18:38   Ct Head Wo Contrast  Result Date: 11/12/2017 CLINICAL DATA:  Unwitnessed fall. EXAM: CT HEAD WITHOUT CONTRAST CT CERVICAL SPINE WITHOUT CONTRAST TECHNIQUE: Multidetector CT imaging of the head and cervical spine was performed following the standard protocol without intravenous contrast. Multiplanar CT image reconstructions of the cervical spine were also generated. COMPARISON:  None. FINDINGS: CT HEAD FINDINGS Brain: Mild chronic ischemic white matter disease is noted. No mass effect or midline shift is noted. Ventricular size is within normal limits. There is no evidence of mass lesion, hemorrhage or acute infarction. Vascular: No hyperdense vessel or unexpected calcification. Skull: Normal. Negative for fracture or focal lesion. Sinuses/Orbits: No acute finding. Other: None. CT CERVICAL SPINE FINDINGS Alignment: Mild reversal of normal lordosis is noted most likely due degenerative change. Skull base and vertebrae: No acute fracture. No primary bone lesion or focal pathologic process. Soft tissues and spinal canal: No prevertebral fluid or swelling. No visible canal hematoma. Disc levels: Severe degenerative disc disease is noted at C3-4, C4-5, C5-6, C6-7 and C7-T1. Upper chest: Negative. Other: Mild degenerative changes are seen involving posterior facet joints bilaterally. IMPRESSION: Mild chronic ischemic white matter disease. No acute intracranial abnormality seen. Severe multilevel degenerative disc disease. No acute abnormality seen in the cervical spine. Electronically Signed   By: Marijo Conception, M.D.   On: 11/12/2017 18:15   Ct Cervical Spine Wo Contrast  Result Date: 11/12/2017 CLINICAL DATA:  Unwitnessed fall. EXAM: CT HEAD WITHOUT CONTRAST CT CERVICAL SPINE  WITHOUT CONTRAST TECHNIQUE: Multidetector CT imaging of the head and cervical spine was performed following the standard protocol without intravenous contrast. Multiplanar CT image reconstructions of the cervical spine were also generated. COMPARISON:  None. FINDINGS: CT HEAD FINDINGS Brain: Mild chronic ischemic white matter disease is noted. No mass effect or midline shift is noted. Ventricular size is within normal limits. There is no evidence of mass lesion, hemorrhage or acute infarction. Vascular: No hyperdense vessel or unexpected calcification. Skull: Normal. Negative for fracture or focal lesion. Sinuses/Orbits: No acute finding. Other: None. CT CERVICAL SPINE FINDINGS Alignment: Mild reversal of normal lordosis is noted most likely due degenerative change. Skull base  and vertebrae: No acute fracture. No primary bone lesion or focal pathologic process. Soft tissues and spinal canal: No prevertebral fluid or swelling. No visible canal hematoma. Disc levels: Severe degenerative disc disease is noted at C3-4, C4-5, C5-6, C6-7 and C7-T1. Upper chest: Negative. Other: Mild degenerative changes are seen involving posterior facet joints bilaterally. IMPRESSION: Mild chronic ischemic white matter disease. No acute intracranial abnormality seen. Severe multilevel degenerative disc disease. No acute abnormality seen in the cervical spine. Electronically Signed   By: Marijo Conception, M.D.   On: 11/12/2017 18:15   Dg Pelvis Portable  Result Date: 11/16/2017 CLINICAL DATA:  Status post hip arthroplasty EXAM: PORTABLE PELVIS 1-2 VIEWS COMPARISON:  11/12/2017 FINDINGS: Right hip hemiarthroplasty is anatomically aligned. No breakage or loosening of the hardware. No acute fracture. Osteopenia. L4-5 spinous process metallic stabilizer is stable in position. Vascular calcifications are noted. IMPRESSION: Status post right hip hemiarthroplasty is anatomically aligned. Electronically Signed   By: Marybelle Killings M.D.   On:  11/16/2017 11:09   Chest Portable 1 View  Result Date: 11/12/2017 CLINICAL DATA:  Preop EXAM: PORTABLE CHEST 1 VIEW COMPARISON:  Radiographs 11/12/2017, chest x-ray 09/04/2017, 04/26/2016. FINDINGS: Right azygos lobe. Stable elevation of left diaphragm with minimal basilar atelectasis. Mild cardiomegaly with aortic atherosclerosis. No pneumothorax. Multiple old left upper rib fractures. IMPRESSION: Elevation of the left diaphragm as before with patchy basilar atelectasis. Cardiomegaly without edema or focal pulmonary infiltrate Electronically Signed   By: Donavan Foil M.D.   On: 11/12/2017 20:57   Dg Hips Bilat W Or Wo Pelvis 3-4 Views  Result Date: 11/12/2017 CLINICAL DATA:  Unwitnessed fall EXAM: DG HIP (WITH OR WITHOUT PELVIS) 3-4V BILAT COMPARISON:  CT 09/04/2017 FINDINGS: Surgical hardware at the lumbar spine. SI joints are not widened. Vascular calcifications. Slightly limited positioning of the left hip with trochanters superimposed over the femoral neck. No obvious fracture is seen. Acute right femoral neck fracture with superior migration of the distal femur. No femoral head dislocation. Extensive vascular calcifications. IMPRESSION: 1. Acute right femoral neck fracture.  No femoral head dislocation 2. No definite acute osseous abnormality of the left hip Electronically Signed   By: Donavan Foil M.D.   On: 11/12/2017 18:32    Leisa Lenz, MD [facility] Pager [phone]  If 7PM-7AM, please contact night-coverage [email or web address] Password [email or web address] 11/18/2017, 11:11 AM   LOS: 6 days

## 2017-11-18 NOTE — Progress Notes (Signed)
Subjective: 2 Days Post-Op Procedure(s) (LRB): ARTHROPLASTY BIPOLAR HIP (HEMIARTHROPLASTY) RIGHT (Right) Patient pleasantly confused, at his baseline.  No issues per RN.  Objective: Vital signs in last 24 hours: Temp:  [98.7 F (37.1 C)-99.9 F (37.7 C)] 99.9 F (37.7 C) (12/31 1550) Pulse Rate:  [78-114] 114 (12/31 1550) Resp:  [18-22] 20 (12/31 1550) BP: (130-169)/(46-79) 169/79 (12/31 1550) SpO2:  [95 %-99 %] 98 % (12/31 1550)  Intake/Output from previous day: 12/30 0701 - 12/31 0700 In: 3635 [P.O.:900; I.V.:2735] Out: 1500 [Urine:1500] Intake/Output this shift: Total I/O In: 1277.3 [P.O.:600; I.V.:400; Blood:277.3] Out: 975 [Urine:975]  Recent Labs    11/16/17 0514 11/17/17 0534 11/18/17 0552  HGB 9.5* 8.8* 7.6*   Recent Labs    11/17/17 0534 11/18/17 0552  WBC 5.0 8.2  RBC 2.74* 2.39*  HCT 24.5* 21.7*  PLT 298 274   Recent Labs    11/17/17 0534 11/18/17 0552  NA 130* 129*  K 3.9 3.9  CL 105 107  CO2 21* 19*  BUN 16 15  CREATININE 0.75 0.64  GLUCOSE 114* 99  CALCIUM 7.6* 7.3*   No results for input(s): LABPT, INR in the last 72 hours.  Neurologically intact Sensation intact distally Intact pulses distally Dorsiflexion/Plantar flexion intact Incision: scant drainage No DVT UE edema NVI  Assessment/Plan: 2 Days Post-Op Procedure(s) (LRB): ARTHROPLASTY BIPOLAR HIP (HEMIARTHROPLASTY) RIGHT (Right) Advance diet Up with therapy  Medical management per hospitalist DVT ppx with SCDs and bid asa WBAT with posterior precuations.  Nicholes Stairs 11/18/2017, 5:02 PM

## 2017-11-18 NOTE — Progress Notes (Signed)
Physical Therapy Treatment Patient Details Name: Alexander Rice MRN: [id number] DOB: 17-Mar-1932 Today's Date: 11/18/2017    History of Present Illness -year-old male with a history of Parkinson's disease, dementia, myelodysplastic syndrome, hypertension, coronary artery disease presented to the emergency department after a fall on 11/11/2017, EMS activated and assisted into recliner. On 12/25, HHRN noticed increased pain. Brought to ED. and found to have ritght hip fracture. S/P hemiarthroplasty 11/16/17    PT Comments    Assisted back to bed using Maxi Move from recliner back to bed with + 2 assist and increased time to position to comfort and to adhere to THP.  Follow Up Recommendations  SNF     Equipment Recommendations  None recommended by PT    Recommendations for Other Services       Precautions / Restrictions Precautions Precautions: Fall;Posterior Hip Restrictions Weight Bearing Restrictions: No RLE Weight Bearing: Weight bearing as tolerated    Mobility  Bed Mobility Overal bed mobility: Needs Assistance Bed Mobility:  Sit to supine     Supine to sit: Mod assist;Max assist;+2 for physical assistance;+2 for safety/equipment     General bed mobility comments: assist with legss and trunk, use of bed pad.  Transfers Maxi Move lift + 2 assist as pt was unable to rise from lower level recliner  Ambulation/Gait                 Stairs            Wheelchair Mobility    Modified Rankin (Stroke Patients Only)       Balance                                            Cognition Arousal/Alertness: Awake/alert Behavior During Therapy: WFL for tasks assessed/performed Overall Cognitive Status: History of cognitive impairments - at baseline                                 General Comments: AxO x 2  pleasant   following repeat commands       Exercises      General Comments        Pertinent Vitals/Pain  Pain Assessment: No/denies pain    Home Living                      Prior Function            PT Goals (current goals can now be found in the care plan section) Progress towards PT goals: Progressing toward goals    Frequency    Min 2X/week      PT Plan Current plan remains appropriate    Co-evaluation              AM-PAC PT "6 Clicks" Daily Activity  Outcome Measure    Difficulty moving from lying on back to sitting on the side of the bed? : Unable Difficulty sitting down on and standing up from a chair with arms (e.g., wheelchair, bedside commode, etc,.)?: Unable Help needed moving to and from a bed to chair (including a wheelchair)?: Total Help needed walking in hospital room?: Total Help needed climbing 3-5 steps with a railing? : Total 6 Click Score: 5    End of Session Equipment Utilized During Treatment: Gait belt Activity Tolerance: Other (  comment)   Nurse Communication: Mobility status;Need for lift equipment PT Visit Diagnosis: Unsteadiness on feet (R26.81);History of falling (Z91.81)     Time: 1224-4975 PT Time Calculation (min) (ACUTE ONLY): 25 min  Charges:  $Therapeutic Activity: 23-37 mins                    G Codes:       {Roshanda Balazs  PTA [facility]  Acute  Rehab Pager      [phone]

## 2017-11-18 NOTE — NC FL2 (Signed)
[place] LEVEL OF CARE SCREENING TOOL     IDENTIFICATION  Patient Name: Alexander Rice Birthdate: August 14, 1932 Sex: male Admission Date (Current Location): 11/12/2017  [facility] [place] Number:  Herbalist and Address:  [facility],  [place] [address], [place]      Provider Number: [id number]  Attending Physician Name and Address:  Robbie Lis, MD  Relative Name and Phone Number:       Current Level of Care: Hospital Recommended Level of Care: [place] Prior Approval Number:    Date Approved/Denied:   PASRR Number:    Discharge Plan: SNF    Current Diagnoses: Patient Active Problem List   Diagnosis Date Noted  . Pressure injury of skin 11/16/2017  . Dementia without behavioral disturbance 11/15/2017  . Closed displaced fracture of right femoral neck ([place]) 11/12/2017  . Choleduodenal fistula s/p cholecystectomy/duodenal resection 09/07/2017 09/07/2017  . Choledocholithiasis   . Fall 09/04/2017  . AAA (abdominal aortic aneurysm) ([place]) 09/04/2017  . Bradycardia 04/27/2016  . Unresponsiveness 04/27/2016  . Severe dementia 03/31/2015  . Spinal stenosis of lumbar region 03/31/2015  . Hyponatremia 03/22/2015  . Pernicious anemia 03/11/2015  . Glaucoma 12/01/2014  . Pulmonary nodule 04/08/2012  . MDS (myelodysplastic syndrome) ([place]) 05/14/2011  . ANEMIA, B12 DEFICIENCY 07/03/2007  . Hyperlipidemia 09/29/2006  . Essential hypertension 09/29/2006  . CAD (coronary artery disease) 09/29/2006  . GERD 09/29/2006  . BPH (benign prostatic hyperplasia) 09/29/2006    Orientation RESPIRATION BLADDER Height & Weight     Self, Place  Normal Continent Weight: 165 lb 2 oz (74.9 kg) Height:  5\' 7"  (170.2 cm)  BEHAVIORAL SYMPTOMS/MOOD NEUROLOGICAL BOWEL NUTRITION STATUS      Continent Diet(Regular )  AMBULATORY STATUS COMMUNICATION OF NEEDS Skin   Extensive Assist   Normal                        Personal Care Assistance Level of Assistance  Bathing, Feeding, Dressing Bathing Assistance: Limited assistance Feeding assistance: Independent Dressing Assistance: Limited assistance     Functional Limitations Info  Sight, Hearing, Speech Sight Info: Adequate Hearing Info: Adequate Speech Info: Adequate    SPECIAL CARE FACTORS FREQUENCY  PT (By licensed PT), OT (By licensed OT)     PT Frequency: 5x/week OT Frequency: 5x/week            Contractures Contractures Info: Not present    Additional Factors Info  Code Status, Allergies, Insulin Sliding Scale Code Status Info: Fullcode Allergies Info: Allergies: No Known Allergies           Current Medications (11/18/2017):  This is the current hospital active medication list Current Facility-Administered Medications  Medication Dose Route Frequency Provider Last Rate Last Dose  . 0.9 %  sodium chloride infusion   Intravenous Continuous Etta Quill, DO 125 mL/hr at 11/18/17 0545    . acetaminophen (TYLENOL) tablet 650 mg  650 mg Oral Q4H PRN Nicholes Stairs, MD   650 mg at 11/17/17 1751   Or  . acetaminophen (TYLENOL) suppository 650 mg  650 mg Rectal Q4H PRN Nicholes Stairs, MD      . acidophilus (RISAQUAD) capsule 1 capsule  1 capsule Oral Daily Jennette Kettle M, DO   1 capsule at 11/17/17 8242  . amLODipine (NORVASC) tablet 5 mg  5 mg Oral Daily Tat, David, MD   5 mg at 11/17/17 3536  . atorvastatin (LIPITOR)  tablet 20 mg  20 mg Oral q1800 Adrian Saran, RPH   20 mg at 11/17/17 1751  . carbidopa-levodopa (SINEMET IR) 25-100 MG per tablet immediate release 0.5 tablet  0.5 tablet Oral TID Etta Quill, DO   0.5 tablet at 11/18/17 0735  . docusate sodium (COLACE) capsule 100 mg  100 mg Oral BID Etta Quill, DO   100 mg at 11/17/17 2119  . dorzolamide (TRUSOPT) 2 % ophthalmic solution 1 drop  1 drop Both Eyes BID Etta Quill, DO   1 drop at 11/17/17 2122  . doxazosin (CARDURA) tablet 4 mg   4 mg Oral QHS Jennette Kettle M, DO   4 mg at 11/17/17 2119  . hydrALAZINE (APRESOLINE) injection 5 mg  5 mg Intravenous Q6H PRN Tat, Shanon Brow, MD      . HYDROcodone-acetaminophen (NORCO/VICODIN) 5-325 MG per tablet 1-2 tablet  1-2 tablet Oral Q6H PRN Etta Quill, DO   1 tablet at 11/18/17 0036  . latanoprost (XALATAN) 0.005 % ophthalmic solution 1 drop  1 drop Both Eyes QHS Etta Quill, DO   1 drop at 11/17/17 2126  . memantine (NAMENDA) tablet 10 mg  10 mg Oral BID Etta Quill, DO   10 mg at 11/17/17 2119  . metoCLOPramide (REGLAN) tablet 5-10 mg  5-10 mg Oral Q8H PRN Nicholes Stairs, MD       Or  . metoCLOPramide [facility]) injection 5-10 mg  5-10 mg Intravenous Q8H PRN Nicholes Stairs, MD      . morphine 2 MG/ML injection 0.5 mg  0.5 mg Intravenous Q2H PRN Etta Quill, DO      . multivitamin with minerals tablet 1 tablet  1 tablet Oral Daily Etta Quill, DO   1 tablet at 11/17/17 4166  . ondansetron (ZOFRAN) tablet 4 mg  4 mg Oral Q6H PRN Nicholes Stairs, MD       Or  . ondansetron [facility]) injection 4 mg  4 mg Intravenous Q6H PRN Nicholes Stairs, MD      . pantoprazole (PROTONIX) EC tablet 40 mg  40 mg Oral Daily Jennette Kettle M, DO   40 mg at 11/17/17 0630  . pyridOXINE (VITAMIN B-6) tablet 250 mg  250 mg Oral Daily Etta Quill, DO   250 mg at 11/17/17 1601  . sodium chloride tablet 1 g  1 g Oral BID WC Orson Eva, MD   1 g at 11/18/17 0735  . vitamin B-12 (CYANOCOBALAMIN) tablet 100 mcg  100 mcg Oral Daily Jennette Kettle M, DO   100 mcg at 11/17/17 0932     Discharge Medications: Please see discharge summary for a list of discharge medications.  Relevant Imaging Results:  Relevant Lab Results:   Additional Information [id number]  Lia Hopping, LCSW

## 2017-11-18 NOTE — Consult Note (Signed)
[facility] Nurse wound consult note Reason for Consult: Pressure injury of sacrum.  No pressure injury noted.  Blanchable erythema of coccygeal area with two partial thickness areas of tissue loss consistent with friction injuries.  Each measures <1cm in diameter and is partial thickness.  Injuries appear related to fall. Assessed today with Wound Treatment Associate (WTA), L. Clark. Patient is incontinent of urine and is currently wearing an external male incontinence device (condom catheter). Wound type: Moisture associated skin damage plus friction Pressure Injury POA: N/A Measurement: Two area of TP skin loss, one at left buttock and right buttock Wound JKD:TOIZ, moist but without drainage Drainage (amount, consistency, odor) None Periwound: blanching erythema Dressing procedure/placement/frequency: Patient will be turned and repositioned from side to side except for meals; a pressure redistribution chair pad is provided for his use when OOB to chair. The condom catheter is currently managing moisture. House skin care products (cleanse and moisture barrier ointment) are in place to enhance tissue recovery. [facility] nursing team will not follow, but will remain available to this patient, the nursing and medical teams.  Please re-consult if needed. Thanks, Maudie Flakes, MSN, RN, [place], Arther Abbott  Pager# [phone]

## 2017-11-19 DIAGNOSIS — B9620 Unspecified Escherichia coli [E. coli] as the cause of diseases classified elsewhere: Secondary | ICD-10-CM | POA: Diagnosis not present

## 2017-11-19 DIAGNOSIS — D649 Anemia, unspecified: Secondary | ICD-10-CM | POA: Diagnosis not present

## 2017-11-19 DIAGNOSIS — R262 Difficulty in walking, not elsewhere classified: Secondary | ICD-10-CM | POA: Diagnosis not present

## 2017-11-19 DIAGNOSIS — R079 Chest pain, unspecified: Secondary | ICD-10-CM | POA: Diagnosis not present

## 2017-11-19 DIAGNOSIS — R8290 Unspecified abnormal findings in urine: Secondary | ICD-10-CM | POA: Diagnosis not present

## 2017-11-19 DIAGNOSIS — N400 Enlarged prostate without lower urinary tract symptoms: Secondary | ICD-10-CM | POA: Diagnosis not present

## 2017-11-19 DIAGNOSIS — R5382 Chronic fatigue, unspecified: Secondary | ICD-10-CM | POA: Diagnosis not present

## 2017-11-19 DIAGNOSIS — D469 Myelodysplastic syndrome, unspecified: Secondary | ICD-10-CM | POA: Diagnosis not present

## 2017-11-19 DIAGNOSIS — G20 Parkinson's disease: Secondary | ICD-10-CM | POA: Diagnosis not present

## 2017-11-19 DIAGNOSIS — R509 Fever, unspecified: Secondary | ICD-10-CM | POA: Diagnosis not present

## 2017-11-19 DIAGNOSIS — H409 Unspecified glaucoma: Secondary | ICD-10-CM | POA: Diagnosis not present

## 2017-11-19 DIAGNOSIS — K219 Gastro-esophageal reflux disease without esophagitis: Secondary | ICD-10-CM | POA: Diagnosis not present

## 2017-11-19 DIAGNOSIS — I714 Abdominal aortic aneurysm, without rupture: Secondary | ICD-10-CM | POA: Diagnosis not present

## 2017-11-19 DIAGNOSIS — R41840 Attention and concentration deficit: Secondary | ICD-10-CM | POA: Diagnosis not present

## 2017-11-19 DIAGNOSIS — S72001A Fracture of unspecified part of neck of right femur, initial encounter for closed fracture: Secondary | ICD-10-CM | POA: Diagnosis not present

## 2017-11-19 DIAGNOSIS — R1312 Dysphagia, oropharyngeal phase: Secondary | ICD-10-CM | POA: Diagnosis not present

## 2017-11-19 DIAGNOSIS — F0390 Unspecified dementia without behavioral disturbance: Secondary | ICD-10-CM | POA: Diagnosis not present

## 2017-11-19 DIAGNOSIS — M25351 Other instability, right hip: Secondary | ICD-10-CM | POA: Diagnosis not present

## 2017-11-19 DIAGNOSIS — S72001D Fracture of unspecified part of neck of right femur, subsequent encounter for closed fracture with routine healing: Secondary | ICD-10-CM | POA: Diagnosis not present

## 2017-11-19 DIAGNOSIS — R799 Abnormal finding of blood chemistry, unspecified: Secondary | ICD-10-CM | POA: Diagnosis not present

## 2017-11-19 DIAGNOSIS — I10 Essential (primary) hypertension: Secondary | ICD-10-CM | POA: Diagnosis not present

## 2017-11-19 DIAGNOSIS — R609 Edema, unspecified: Secondary | ICD-10-CM | POA: Diagnosis not present

## 2017-11-19 DIAGNOSIS — R0689 Other abnormalities of breathing: Secondary | ICD-10-CM | POA: Diagnosis not present

## 2017-11-19 DIAGNOSIS — I2510 Atherosclerotic heart disease of native coronary artery without angina pectoris: Secondary | ICD-10-CM | POA: Diagnosis not present

## 2017-11-19 DIAGNOSIS — R41841 Cognitive communication deficit: Secondary | ICD-10-CM | POA: Diagnosis not present

## 2017-11-19 DIAGNOSIS — A499 Bacterial infection, unspecified: Secondary | ICD-10-CM | POA: Diagnosis not present

## 2017-11-19 DIAGNOSIS — D513 Other dietary vitamin B12 deficiency anemia: Secondary | ICD-10-CM | POA: Diagnosis not present

## 2017-11-19 DIAGNOSIS — L89610 Pressure ulcer of right heel, unstageable: Secondary | ICD-10-CM | POA: Diagnosis not present

## 2017-11-19 DIAGNOSIS — S728X9A Other fracture of unspecified femur, initial encounter for closed fracture: Secondary | ICD-10-CM | POA: Diagnosis not present

## 2017-11-19 DIAGNOSIS — Z471 Aftercare following joint replacement surgery: Secondary | ICD-10-CM | POA: Diagnosis not present

## 2017-11-19 DIAGNOSIS — R410 Disorientation, unspecified: Secondary | ICD-10-CM | POA: Diagnosis not present

## 2017-11-19 DIAGNOSIS — M6281 Muscle weakness (generalized): Secondary | ICD-10-CM | POA: Diagnosis not present

## 2017-11-19 LAB — TYPE AND SCREEN
ABO/RH(D): A POS
ANTIBODY SCREEN: NEGATIVE
UNIT DIVISION: 0
Unit division: 0
Unit division: 0

## 2017-11-19 LAB — BPAM RBC
BLOOD PRODUCT EXPIRATION DATE: 201901232359
Blood Product Expiration Date: 201901232359
Blood Product Expiration Date: 201901242359
ISSUE DATE / TIME: 201812290914
ISSUE DATE / TIME: 201812290914
ISSUE DATE / TIME: 201812311242
UNIT TYPE AND RH: 6200
Unit Type and Rh: 6200
Unit Type and Rh: 6200

## 2017-11-19 LAB — BASIC METABOLIC PANEL
Anion gap: 3 — ABNORMAL LOW (ref 5–15)
BUN: 24 mg/dL — ABNORMAL HIGH (ref 6–20)
CHLORIDE: 107 mmol/L (ref 101–111)
CO2: 20 mmol/L — AB (ref 22–32)
Calcium: 7.6 mg/dL — ABNORMAL LOW (ref 8.9–10.3)
Creatinine, Ser: 0.88 mg/dL (ref 0.61–1.24)
GFR calc non Af Amer: >60 mL/min (ref >60)
Glucose, Bld: 103 mg/dL — ABNORMAL HIGH (ref 65–99)
POTASSIUM: 3.9 mmol/L (ref 3.5–5.1)
SODIUM: 130 mmol/L — AB (ref 135–145)

## 2017-11-19 LAB — CBC
HEMATOCRIT: 23.9 % — AB (ref 39.0–52.0)
HEMOGLOBIN: 8.2 g/dL — AB (ref 13.0–17.0)
MCH: 31.2 pg (ref 26.0–34.0)
MCHC: 34.3 g/dL (ref 30.0–36.0)
MCV: 90.9 fL (ref 78.0–100.0)
Platelets: 284 10*3/uL (ref 150–400)
RBC: 2.63 MIL/uL — AB (ref 4.22–5.81)
RDW: 19.2 % — ABNORMAL HIGH (ref 11.5–15.5)
WBC: 12.1 10*3/uL — ABNORMAL HIGH (ref 4.0–10.5)

## 2017-11-19 MED ORDER — ASPIRIN 81 MG PO TABS: 81.0000 mg | ORAL_TABLET | Freq: Two times a day (BID) | ORAL | 0 refills | Status: DC

## 2017-11-19 MED ORDER — AMLODIPINE BESYLATE 5 MG PO TABS: 5.0000 mg | ORAL_TABLET | Freq: Every day | ORAL | 0 refills | Status: DC

## 2017-11-19 MED ORDER — ASPIRIN 325 MG PO TABS: 325.0000 mg | ORAL_TABLET | Freq: Two times a day (BID) | ORAL | Status: DC

## 2017-11-19 MED ORDER — SODIUM CHLORIDE 1 G PO TABS: 1.0000 g | ORAL_TABLET | Freq: Two times a day (BID) | ORAL | 0 refills | Status: DC

## 2017-11-19 NOTE — Clinical Social Work Placement (Addendum)
[facility] arranged for 2:30pm D/C summary sent. Nurse given number for report.   CLINICAL SOCIAL WORK PLACEMENT  NOTE  Date:  11/19/2017  Patient Details  Name: Alexander Rice MRN: [id number] Date of Birth: Oct 17, 1932  Clinical Social Work is seeking post-discharge placement for this patient at the [place] level of care (*CSW will initial, date and re-position this form in  chart as items are completed):  Yes   Patient/family provided with [facility] Work Department's list of facilities offering this level of care within the geographic area requested by the patient (or if unable, by the patient's family).  Yes   Patient/family informed of their freedom to choose among providers that offer the needed level of care, that participate in Medicare, Medicaid or managed care program needed by the patient, have an available bed and are willing to accept the patient.  Yes   Patient/family informed of [facility]'s ownership interest in [facility] and [facility], as well as of the fact that they are under no obligation to receive care at these facilities.  PASRR submitted to EDS on 11/18/17     PASRR number received on 11/18/17   [id number]  Existing PASRR number confirmed on       FL2 transmitted to all facilities in geographic area requested by pt/family on       FL2 transmitted to all facilities within larger geographic area on 11/18/17     Patient informed that his/her managed care company has contracts with or will negotiate with certain facilities, including the following:  [facility]     Yes   Patient/family informed of bed offers received.  Patient chooses bed at [facility]     Physician recommends and patient chooses bed at      Patient to be transferred to [facility] on 11/19/17.  Patient to be transferred to facility by [facility]   Patient family notified on 11/19/17 of transfer.  Name of  family member notified:  Daughter and Son at bedside  PHYSICIAN       Additional Comment:    _______________________________________________ Lia Hopping, LCSW 11/19/2017, 1:26 PM

## 2017-11-19 NOTE — Plan of Care (Signed)
Plan of care reviewed. 

## 2017-11-19 NOTE — Progress Notes (Signed)
Report called to [facility] at [facility]   D Mateo Flow RN

## 2017-11-19 NOTE — Clinical Social Work Note (Signed)
Clinical Social Work Assessment  Patient Details  Name: Alexander Rice MRN: [id number] Date of Birth: 12/27/1931  Date of referral:  11/19/17               Reason for consult:  Facility Placement                Permission sought to share information with:  Family Supports Permission granted to share information::  Yes, Verbal Permission Granted  Name::        Agency::  SNF  Relationship::  Spouse/Daughter  Contact Information:     Housing/Transportation Living arrangements for the past 2 months:  Apartment Source of Information:  Patient Patient Interpreter Needed:  None Criminal Activity/Legal Involvement Pertinent to Current Situation/Hospitalization:  No - Comment as needed Significant Relationships:  Adult Children, Spouse Lives with:  Spouse Do you feel safe going back to the place where you live?  Yes Need for family participation in patient care:  Yes (Comment)(Dementia /Parkinson, dependent w/mobility )  Care giving concerns:  Patient admitted for fall and right hip pain.   Social Worker assessment / plan:  CSW met with patient,pt. spouse and daughter at bedside. She reports the patient and spouse live together and have managed together. The patient is able to complete ADL's.  She reports she is concern about the patient falling again in the apartment. She reports due to the parkinson the patient has been having more hallucinations. She reports the patient was hallucinating when he fell. Patient daughter is agreeable for short rehab at SNF.  CSW explain faxing out process, and provided bed offer.  CSW completed FL2, PASRR.   Plan: SNF for rehab  Employment status:   Retired Insurance information:   Medicare PT Recommendations:   SNF Information / Referral to community resources:   SNF  Patient/Family's Response to care:  Patient daughter agreeable the patient will need rehab before returning home.   Patient/Family's Understanding of and Emotional Response to  Diagnosis, Current Treatment, and Prognosis:  Patient daughter is medical POA and very knowledgeable of patient diagnosis and current treatment. She is hopeful to eventually transition the patient to LTC at SNF.   Emotional Assessment Appearance:    Attitude/Demeanor/Rapport:    Affect (typically observed):  Accepting, Calm Orientation:  Oriented to Self, Oriented to Place Alcohol / Substance use:    Psych involvement (Current and /or in the community):  No (Comment)  Discharge Needs  Concerns to be addressed:  Discharge Planning Concerns Readmission within the last 30 days:  No Current discharge risk:  Dependent with Mobility Barriers to Discharge:  No Barriers Identified    A , LCSW 11/19/2017, 9:36 AM  

## 2017-11-19 NOTE — Discharge Summary (Signed)
Physician Discharge Summary  Alexander Rice UKG:[id number] DOB: 12-23-1931 DOA: 11/12/2017  PCP: Marin Olp, MD  Admit date: 11/12/2017 Discharge date: 11/19/2017  Admitted From: Home Disposition:  SNF  Recommendations for Outpatient Follow-up:  1. Follow up with orthopedics in 3 weeks 2. Please obtain BMP/CBC in one week  Discharge Condition: Stable CODE STATUS: Full Diet recommendation: Heart Healthy  Brief/Interim Summary: Patient is 84 year old male with a history of Parkinson's disease, dementia, myelodysplastic syndrome, hypertension, coronary artery disease presented to the emergency department after a fall on 11/11/2017.  Apparently EMS was activated, and the patient was helped back to the chair and not brought to the emergency department at that time.  Since then, the patient continued to have severe right hip pain with difficulty getting up.  As result, the patient was subsequently brought to the emergency department on 11/12/2017.  X-rays revealed an acute right femoral neck fracture. Orthopedics was consulted and he underwent right hip  Hemiarthroplasty. Surgery was delayed secondary to the patient's profound hyponatremia.  At the time of admission, the patient was noted to have a serum sodium 118.  There is no history of any new medications, vomiting, or diarrhea. The patient was started on intravenous fluids with slow improvement.  His sodium level is improving on  salt tablets. Patient is being discharged to skilled nursing facility today.  Follow-up with orthopedics as an outpatient.  Following problems were addressed during this hospitalization:   Right femoral neck fracture - S/P Arthroplasty Bipolar Hip 11/16/2017  - Per PT evaluation - SNF recommended -- Continue pain management as necessary. -Continue DVT prophylaxis with aspirin 81 mg twice a day  Hyponatremia - Chronic usually in range 128-132 - Possibility includes SIADH - Sodium 130 today -  Continue salt tablets for at least a week.  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 / Postoperative blood loss anemia  - S/P Transfusion - Check CBC in a week  Parkinson's disease - Continue Sinemet   Dementia without behavioral disturbance  - Continue Namenda   Hyperlipidemia - Continue statin        Discharge Diagnoses:  Principal Problem:   Closed displaced fracture of right femoral neck (HCC) Active Problems:   Essential hypertension   MDS (myelodysplastic syndrome) (HCC)   Hyponatremia   Severe dementia   Dementia without behavioral disturbance   Pressure injury of skin    Discharge Instructions  Discharge Instructions    Diet - low sodium heart healthy   Complete by:  As directed    Discharge instructions   Complete by:  As directed    1) Follow up with orthopedics in 3 weeks.  Name and number of the provider has been attached. 2) Check CBC/BMP in a week to check your white cell count and sodium level.   Increase activity slowly   Complete by:  As directed      Allergies as of 11/19/2017   No Known Allergies     Medication List    TAKE these medications   amLODipine 5 MG tablet Commonly known as:  NORVASC Take 1 tablet (5 mg total) by mouth daily. Start taking on:  11/20/2017   aspirin 81 MG tablet Take 1 tablet (81 mg total) by mouth 2 (two) times daily. What changed:  when to take this   carbidopa-levodopa 25-100  MG tablet Commonly known as:  SINEMET IR Take 1/2 tablet three times a day What changed:    how much to take  how to take this  when to take this  additional instructions   [employer] OMEGA-3 KRILL OIL PO Take 350 mg by mouth daily. Reported on 01/16/2016   [employer] PROBIOTIC Chew Chew 1 tablet by mouth daily.   cyanocobalamin 100 MCG tablet Take  100 mcg by mouth daily.   docusate sodium 100 MG capsule Commonly known as:  COLACE Take 1 capsule (100 mg total) by mouth 2 (two) times daily.   dorzolamide 2 % ophthalmic solution Commonly known as:  TRUSOPT Place 1 drop into both eyes 2 (two) times daily.   doxazosin 4 MG tablet Commonly known as:  CARDURA TAKE 1 TABLET BY MOUTH AT  BEDTIME   epoetin alfa 40000 UNIT/ML injection Commonly known as:  PROCRIT Inject 1 mL (40,000 Units total) into the skin every 21 ( twenty-one) days. As needed for Hgb < 10g/dl   HYDROcodone-acetaminophen 5-325 MG tablet Commonly known as:  NORCO/VICODIN Take 1 tablet by mouth every 6 (six) hours as needed for moderate pain.   lisinopril 5 MG tablet Commonly known as:  PRINIVIL,ZESTRIL TAKE 1 TABLET BY MOUTH  DAILY   LUMIGAN 0.01 % Soln Generic drug:  bimatoprost Place 1 drop into both eyes at bedtime.   memantine 10 MG tablet Commonly known as:  NAMENDA TAKE 1 TABLET BY MOUTH TWO  TIMES DAILY   multivitamin tablet Take 1 tablet by mouth daily.   omeprazole 20 MG capsule Commonly known as:  PRILOSEC TAKE 1 CAPSULE BY MOUTH  DAILY   simvastatin 40 MG tablet Commonly known as:  ZOCOR Hold for 2 to 4 weeks and recheck liver function tests at that time and if stable, can be restarted as per discretion of the PCP. What changed:    how much to take  how to take this  when to take this  additional instructions   sodium chloride 1 g tablet Take 1 tablet (1 g total) by mouth 2 (two) times daily with a meal.   vitamin B-6 250 MG tablet Take 1 tablet (250 mg total) by mouth daily.      Follow-up Information    Nicholes Stairs, MD. Schedule an appointment as soon as possible for a visit in 3 week(s).   Specialty:  Orthopedic Surgery Why:  For suture removal Contact information: [address] [place] [address] [phone]        Marin Olp, MD. Schedule an appointment as soon as possible for a  visit in 1 week(s).   Specialty:  Family Medicine Contact information: [address] [place] [address] [phone]          No Known Allergies  Consultations: Orthopedics  Procedures/Studies: Dg Lumbar Spine Complete  Result Date: 11/12/2017 CLINICAL DATA:  Unwitnessed fall EXAM: LUMBAR SPINE - COMPLETE 4+ VIEW COMPARISON:  CT 09/04/2017 FINDINGS: Mild scoliosis of the lumbar spine, apex left. Extensive atherosclerotic vascular disease of the aorta and branch vessels with calcified aneurysm of the mid to distal abdominal aorta, measuring up to 4.7 cm anterior to posterior. Vertebral body heights appear maintained. Multilevel moderate to marked degenerative disc changes. Surgical hardware posterior to L4-L5. IMPRESSION: 1. Scoliosis of the lumbar spine with advanced degenerative change. No acute osseous abnormality 2. Calcified abdominal aortic aneurysm, measures up to 4.8 cm maximum AP, compared with 35 mm on the comparison CT ;  follow-up CTA would more accurately characterize the aneurysm and assess for true interval change in size Electronically Signed   By: Donavan Foil M.D.   On: 11/12/2017 18:38   Ct Head Wo Contrast  Result Date: 11/12/2017 CLINICAL DATA:  Unwitnessed fall. EXAM: CT HEAD WITHOUT CONTRAST CT CERVICAL SPINE WITHOUT CONTRAST TECHNIQUE: Multidetector CT imaging of the head and cervical spine was performed following the standard protocol without intravenous contrast. Multiplanar CT image reconstructions of the cervical spine were also generated. COMPARISON:  None. FINDINGS: CT HEAD FINDINGS Brain: Mild chronic ischemic white matter disease is noted. No mass effect or midline shift is noted. Ventricular size is within normal limits. There is no evidence of mass lesion, hemorrhage or acute infarction. Vascular: No hyperdense vessel or unexpected calcification. Skull: Normal. Negative for fracture or focal lesion. Sinuses/Orbits: No acute finding. Other: None.  CT CERVICAL SPINE FINDINGS Alignment: Mild reversal of normal lordosis is noted most likely due degenerative change. Skull base and vertebrae: No acute fracture. No primary bone lesion or focal pathologic process. Soft tissues and spinal canal: No prevertebral fluid or swelling. No visible canal hematoma. Disc levels: Severe degenerative disc disease is noted at C3-4, C4-5, C5-6, C6-7 and C7-T1. Upper chest: Negative. Other: Mild degenerative changes are seen involving posterior facet joints bilaterally. IMPRESSION: Mild chronic ischemic white matter disease. No acute intracranial abnormality seen. Severe multilevel degenerative disc disease. No acute abnormality seen in the cervical spine. Electronically Signed   By: Marijo Conception, M.D.   On: 11/12/2017 18:15   Ct Cervical Spine Wo Contrast  Result Date: 11/12/2017 CLINICAL DATA:  Unwitnessed fall. EXAM: CT HEAD WITHOUT CONTRAST CT CERVICAL SPINE WITHOUT CONTRAST TECHNIQUE: Multidetector CT imaging of the head and cervical spine was performed following the standard protocol without intravenous contrast. Multiplanar CT image reconstructions of the cervical spine were also generated. COMPARISON:  None. FINDINGS: CT HEAD FINDINGS Brain: Mild chronic ischemic white matter disease is noted. No mass effect or midline shift is noted. Ventricular size is within normal limits. There is no evidence of mass lesion, hemorrhage or acute infarction. Vascular: No hyperdense vessel or unexpected calcification. Skull: Normal. Negative for fracture or focal lesion. Sinuses/Orbits: No acute finding. Other: None. CT CERVICAL SPINE FINDINGS Alignment: Mild reversal of normal lordosis is noted most likely due degenerative change. Skull base and vertebrae: No acute fracture. No primary bone lesion or focal pathologic process. Soft tissues and spinal canal: No prevertebral fluid or swelling. No visible canal hematoma. Disc levels: Severe degenerative disc disease is noted at C3-4,  C4-5, C5-6, C6-7 and C7-T1. Upper chest: Negative. Other: Mild degenerative changes are seen involving posterior facet joints bilaterally. IMPRESSION: Mild chronic ischemic white matter disease. No acute intracranial abnormality seen. Severe multilevel degenerative disc disease. No acute abnormality seen in the cervical spine. Electronically Signed   By: Marijo Conception, M.D.   On: 11/12/2017 18:15   Dg Pelvis Portable  Result Date: 11/16/2017 CLINICAL DATA:  Status post hip arthroplasty EXAM: PORTABLE PELVIS 1-2 VIEWS COMPARISON:  11/12/2017 FINDINGS: Right hip hemiarthroplasty is anatomically aligned. No breakage or loosening of the hardware. No acute fracture. Osteopenia. L4-5 spinous process metallic stabilizer is stable in position. Vascular calcifications are noted. IMPRESSION: Status post right hip hemiarthroplasty is anatomically aligned. Electronically Signed   By: Marybelle Killings M.D.   On: 11/16/2017 11:09   Dg Chest Port 1 View  Result Date: 11/18/2017 CLINICAL DATA:  Wheezing. EXAM: PORTABLE CHEST 1 VIEW COMPARISON:  Chest x-rays due  11/12/2017 and 09/04/2017 and 05/29/2016 and 04/26/2016 and chest CT dated 03/31/2014 FINDINGS: Heart size and pulmonary vascularity are normal. Tortuosity and calcification of the thoracic aorta. Chronic slight elevation of the left hemidiaphragm. Vague density at the left lung base laterally is felt represent slight atelectasis. No acute bone abnormality.  Old left posterior rib fractures. IMPRESSION: New slight atelectasis at the left lung base. Electronically Signed   By: Lorriane Shire M.D.   On: 11/18/2017 11:57   Chest Portable 1 View  Result Date: 11/12/2017 CLINICAL DATA:  Preop EXAM: PORTABLE CHEST 1 VIEW COMPARISON:  Radiographs 11/12/2017, chest x-ray 09/04/2017, 04/26/2016. FINDINGS: Right azygos lobe. Stable elevation of left diaphragm with minimal basilar atelectasis. Mild cardiomegaly with aortic atherosclerosis. No pneumothorax. Multiple old  left upper rib fractures. IMPRESSION: Elevation of the left diaphragm as before with patchy basilar atelectasis. Cardiomegaly without edema or focal pulmonary infiltrate Electronically Signed   By: Donavan Foil M.D.   On: 11/12/2017 20:57   Dg Hips Bilat W Or Wo Pelvis 3-4 Views  Result Date: 11/12/2017 CLINICAL DATA:  Unwitnessed fall EXAM: DG HIP (WITH OR WITHOUT PELVIS) 3-4V BILAT COMPARISON:  CT 09/04/2017 FINDINGS: Surgical hardware at the lumbar spine. SI joints are not widened. Vascular calcifications. Slightly limited positioning of the left hip with trochanters superimposed over the femoral neck. No obvious fracture is seen. Acute right femoral neck fracture with superior migration of the distal femur. No femoral head dislocation. Extensive vascular calcifications. IMPRESSION: 1. Acute right femoral neck fracture.  No femoral head dislocation 2. No definite acute osseous abnormality of the left hip Electronically Signed   By: Donavan Foil M.D.   On: 11/12/2017 18:32    Patient seen and examined the bedside this morning.   Subjective: Patient seen and examined the bedside this morning.  Sodium level 130 today.  White cell counts jumped to 12.1 ,patient is afebrile.  Chest x-ray done this morning did not reveal any pneumonia.  He denies any pain. patient is being discharged to skilled nursing facility.  Discharge Exam: Vitals:   11/18/17 2333 11/19/17 0700  BP: (!) 120/37 (!) 105/42  Pulse: 73 66  Resp: 19 18  Temp: 99.6 F (37.6 C) 98.6 F (37 C)  SpO2: 96% 97%   Vitals:   11/18/17 1440 11/18/17 1550 11/18/17 2333 11/19/17 0700  BP:  (!) 169/79 (!) 120/37 (!) 105/42  Pulse: (!) 102 (!) 114 73 66  Resp: 20 20 19 18   Temp:  99.9 F (37.7 C) 99.6 F (37.6 C) 98.6 F (37 C)  TempSrc:  Oral Oral Oral  SpO2: 96% 98% 96% 97%  Weight:      Height:        General: Pt is alert, awake, not in acute distress Cardiovascular: RRR, S1/S2 +, no rubs, no gallops Respiratory: CTA  bilaterally, no wheezing, no rhonchi Abdominal: Soft, NT, ND, bowel sounds + Extremities: no edema, no cyanosis    The results of significant diagnostics from this hospitalization (including imaging, microbiology, ancillary and laboratory) are listed below for reference.     Microbiology: Recent Results (from the past 240 hour(s))  Surgical PCR screen     Status: None   Collection Time: 11/12/17 11:20 PM  Result Value Ref Range Status   MRSA, PCR NEGATIVE NEGATIVE Final   Staphylococcus aureus NEGATIVE NEGATIVE Final    Comment: (NOTE) The Xpert SA Assay (FDA approved for NASAL specimens in patients 4 years of age and older), is one component of a  comprehensive surveillance program. It is not intended to diagnose infection nor to guide or monitor treatment.      Labs: BNP (last 3 results) No results for input(s): BNP in the last 8760 hours. Basic Metabolic Panel: Recent Labs  Lab 11/15/17 1425 11/16/17 0514 11/17/17 0534 11/18/17 0552 11/19/17 0541  NA 125* 127* 130* 129* 130*  K 3.8 3.9 3.9 3.9 3.9  CL 102 103 105 107 107  CO2 18* 20* 21* 19* 20*  GLUCOSE 128* 116* 114* 99 103*  BUN 13 16 16 15  24*  CREATININE 0.72 0.73 0.75 0.64 0.88  CALCIUM 7.5* 7.6* 7.6* 7.3* 7.6*  MG  --  1.5*  --  1.8  --    Liver Function Tests: No results for input(s): AST, ALT, ALKPHOS, BILITOT, PROT, ALBUMIN in the last 168 hours. No results for input(s): LIPASE, AMYLASE in the last 168 hours. No results for input(s): AMMONIA in the last 168 hours. CBC: Recent Labs  Lab 11/12/17 1820 11/15/17 1425 11/16/17 0514 11/17/17 0534 11/18/17 0552 11/19/17 0541  WBC 9.9 4.1 5.0 5.0 8.2 12.1*  NEUTROABS 8.1*  --   --   --   --   --   HGB 8.4* 9.1* 9.5* 8.8* 7.6* 8.2*  HCT 23.5* 25.2* 26.2* 24.5* 21.7* 23.9*  MCV 92.2 90.6 90.3 89.4 90.8 90.9  PLT 325 307 316 298 274 284   Cardiac Enzymes: No results for input(s): CKTOTAL, CKMB, CKMBINDEX, TROPONINI in the last 168  hours. BNP: Invalid input(s): POCBNP CBG: No results for input(s): GLUCAP in the last 168 hours. D-Dimer No results for input(s): DDIMER in the last 72 hours. Hgb A1c No results for input(s): HGBA1C in the last 72 hours. Lipid Profile No results for input(s): CHOL, HDL, LDLCALC, TRIG, CHOLHDL, LDLDIRECT in the last 72 hours. Thyroid function studies No results for input(s): TSH, T4TOTAL, T3FREE, THYROIDAB in the last 72 hours.  Invalid input(s): FREET3 Anemia work up No results for input(s): VITAMINB12, FOLATE, FERRITIN, TIBC, IRON, RETICCTPCT in the last 72 hours. Urinalysis    Component Value Date/Time   COLORURINE YELLOW 11/13/2017 0240   APPEARANCEUR CLEAR 11/13/2017 0240   LABSPEC 1.015 11/13/2017 0240   PHURINE 6.0 11/13/2017 0240   GLUCOSEU NEGATIVE 11/13/2017 0240   HGBUR NEGATIVE 11/13/2017 0240   HGBUR negative 10/11/2009 0819   BILIRUBINUR NEGATIVE 11/13/2017 0240   BILIRUBINUR n 04/18/2016 1152   KETONESUR 5 (A) 11/13/2017 0240   PROTEINUR NEGATIVE 11/13/2017 0240   UROBILINOGEN 1.0 04/18/2016 1152   UROBILINOGEN 0.2 10/11/2009 0819   NITRITE NEGATIVE 11/13/2017 0240   LEUKOCYTESUR NEGATIVE 11/13/2017 0240   Sepsis Labs Invalid input(s): PROCALCITONIN,  WBC,  LACTICIDVEN Microbiology Recent Results (from the past 240 hour(s))  Surgical PCR screen     Status: None   Collection Time: 11/12/17 11:20 PM  Result Value Ref Range Status   MRSA, PCR NEGATIVE NEGATIVE Final   Staphylococcus aureus NEGATIVE NEGATIVE Final    Comment: (NOTE) The Xpert SA Assay (FDA approved for NASAL specimens in patients 46 years of age and older), is one component of a comprehensive surveillance program. It is not intended to diagnose infection nor to guide or monitor treatment.      Time coordinating discharge: Over 30 minutes  SIGNED:   Marene Lenz, MD  [facility] 11/19/2017, 1:05 PM Pager [phone]  If 7PM-7AM, please contact  night-coverage [email or web address] Password [email or web address]

## 2017-11-19 NOTE — Progress Notes (Signed)
   Subjective: 3 Days Post-Op Procedure(s) (LRB): ARTHROPLASTY BIPOLAR HIP (HEMIARTHROPLASTY) RIGHT (Right) Patient reports pain as mild.   Patient seen in rounds with Dr. Gladstone Lighter. Patient is well, and has had no acute complaints or problems from ortho standpoint. No issues overnight. No SOB or chest pain.  Positive flatus. Foley in place.   Objective: Vital signs in last 24 hours: Temp:  [98.6 F (37 C)-99.9 F (37.7 C)] 98.6 F (37 C) (01/01 0700) Pulse Rate:  [66-114] 66 (01/01 0700) Resp:  [18-22] 18 (01/01 0700) BP: (105-169)/(37-79) 105/42 (01/01 0700) SpO2:  [96 %-99 %] 97 % (01/01 0700)  Intake/Output from previous day:  Intake/Output Summary (Last 24 hours) at 11/19/2017 0909 Last data filed at 11/19/2017 0700 Gross per 24 hour  Intake 1142.3 ml  Output 1670 ml  Net -527.7 ml     Labs: Recent Labs    11/17/17 0534 11/18/17 0552 11/19/17 0541  HGB 8.8* 7.6* 8.2*   Recent Labs    11/18/17 0552 11/19/17 0541  WBC 8.2 12.1*  RBC 2.39* 2.63*  HCT 21.7* 23.9*  PLT 274 284   Recent Labs    11/18/17 0552 11/19/17 0541  NA 129* 130*  K 3.9 3.9  CL 107 107  CO2 19* 20*  BUN 15 24*  CREATININE 0.64 0.88  GLUCOSE 99 103*  CALCIUM 7.3* 7.6*    EXAM General - Patient is Alert and Oriented Extremity - Neurologically intact Intact pulses distally Dorsiflexion/Plantar flexion intact No cellulitis present Compartment soft Dressing/Incision - clean, dry, no drainage Motor Function - intact, moving foot and toes well on exam.   Past Medical History:  Diagnosis Date  . Adenomatous colon polyp 02/1996, 02/2011   TA polyp 02/2011  . Anemia in chronic renal disease 11/02/2011  . B12 deficiency   . BPH (benign prostatic hyperplasia)   . CAD (coronary artery disease)   . CVD (cardiovascular disease)   . Dementia   . Diverticulosis   . Esophageal stricture   . GERD (gastroesophageal reflux disease)   . Glaucoma   . Heart palpitations   . Hemorrhoids   .  Hiatal hernia   . History of colonic polyps 09/29/2006   Polyps age 37, Dr. Fuller Plan stated no further colonoscopy due to age. Confirmed with daughter given alzheimers, CAD history    . Hyperlipidemia   . Hypertension   . MDS (myelodysplastic syndrome) (HCC)     Assessment/Plan: 3 Days Post-Op Procedure(s) (LRB): ARTHROPLASTY BIPOLAR HIP (HEMIARTHROPLASTY) RIGHT (Right) Principal Problem:   Closed displaced fracture of right femoral neck (HCC) Active Problems:   Essential hypertension   MDS (myelodysplastic syndrome) (HCC)   Hyponatremia   Severe dementia   Dementia without behavioral disturbance   Pressure injury of skin  Estimated body mass index is 25.86 kg/m as calculated from the following:   Height as of this encounter: 5\' 7"  (1.702 m).   Weight as of this encounter: 74.9 kg (165 lb 2 oz). Advance diet Up with therapy  DVT Prophylaxis - Aspirin Weight-Bearing as tolerated  Continue therapy. DC per medicine. Medicine to continue to monitor Hgb and hyponatremia.  Ardeen Jourdain, PA-C Orthopaedic Surgery 11/19/2017, 9:09 AM

## 2017-11-20 ENCOUNTER — Telehealth: Payer: Self-pay | Admitting: *Deleted

## 2017-11-20 NOTE — Telephone Encounter (Signed)
Left a voicemail requesting return call to schedule hospital follow up.

## 2017-11-22 ENCOUNTER — Other Ambulatory Visit: Payer: Self-pay | Admitting: Family

## 2017-11-22 DIAGNOSIS — M25351 Other instability, right hip: Secondary | ICD-10-CM | POA: Diagnosis not present

## 2017-11-22 DIAGNOSIS — F0390 Unspecified dementia without behavioral disturbance: Secondary | ICD-10-CM | POA: Diagnosis not present

## 2017-11-22 DIAGNOSIS — R609 Edema, unspecified: Secondary | ICD-10-CM | POA: Diagnosis not present

## 2017-11-22 DIAGNOSIS — D469 Myelodysplastic syndrome, unspecified: Secondary | ICD-10-CM

## 2017-11-22 DIAGNOSIS — D508 Other iron deficiency anemias: Secondary | ICD-10-CM

## 2017-11-25 ENCOUNTER — Inpatient Hospital Stay: Payer: Medicare Other | Attending: Family | Admitting: Family

## 2017-11-25 ENCOUNTER — Inpatient Hospital Stay: Payer: Medicare Other

## 2017-11-27 ENCOUNTER — Other Ambulatory Visit: Payer: Self-pay | Admitting: *Deleted

## 2017-11-27 NOTE — Patient Outreach (Signed)
[facility]) Care Management  11/27/2017  Alexander Rice 12-02-1931 [id number]   Per [name], SW at facility. Patient will discharge to a LTC or Memory care unit.  Plan to sign off at this time due to pending discharge disposition to LTC. [name]. [name], RN, BSN, [place] [phone]) Business Cell  [phone]) Toll Free Office

## 2017-12-02 DIAGNOSIS — S72001D Fracture of unspecified part of neck of right femur, subsequent encounter for closed fracture with routine healing: Secondary | ICD-10-CM | POA: Diagnosis not present

## 2017-12-02 DIAGNOSIS — M6281 Muscle weakness (generalized): Secondary | ICD-10-CM | POA: Diagnosis not present

## 2017-12-02 DIAGNOSIS — D469 Myelodysplastic syndrome, unspecified: Secondary | ICD-10-CM | POA: Diagnosis not present

## 2017-12-02 DIAGNOSIS — F0390 Unspecified dementia without behavioral disturbance: Secondary | ICD-10-CM | POA: Diagnosis not present

## 2017-12-03 ENCOUNTER — Telehealth: Payer: Self-pay | Admitting: *Deleted

## 2017-12-03 DIAGNOSIS — F0390 Unspecified dementia without behavioral disturbance: Secondary | ICD-10-CM | POA: Diagnosis not present

## 2017-12-03 DIAGNOSIS — G20 Parkinson's disease: Secondary | ICD-10-CM | POA: Diagnosis not present

## 2017-12-03 DIAGNOSIS — M6281 Muscle weakness (generalized): Secondary | ICD-10-CM | POA: Diagnosis not present

## 2017-12-03 NOTE — Telephone Encounter (Signed)
Patient fell and broke hip. After surgical intervention, patient has been admitted to [facility]. He had blood work drawn and his hemoglobin is 7.7. The facility has prescribed aranesp q21 days, with blood checks every week. If hemoglobin drops below 7 they will arrange for a transfusion.  The daughter, Curt Bears is notifying the office of this protocol to see if Dr Marin Olp is okay, or would like to do something different.   Reviewed protocol with Dr Marin Olp. He would like Curt Bears to notify the office of the next hemoglobin level obtained. For now, patient can continue plan of care at rehab facility.   Daughter is aware of the recommendation. She will notify the office next week of the patient's hemoglobin level.

## 2017-12-05 DIAGNOSIS — L89610 Pressure ulcer of right heel, unstageable: Secondary | ICD-10-CM | POA: Diagnosis not present

## 2017-12-06 DIAGNOSIS — S72001D Fracture of unspecified part of neck of right femur, subsequent encounter for closed fracture with routine healing: Secondary | ICD-10-CM | POA: Diagnosis not present

## 2017-12-06 DIAGNOSIS — R0689 Other abnormalities of breathing: Secondary | ICD-10-CM | POA: Diagnosis not present

## 2017-12-06 DIAGNOSIS — M6281 Muscle weakness (generalized): Secondary | ICD-10-CM | POA: Diagnosis not present

## 2017-12-09 DIAGNOSIS — R8290 Unspecified abnormal findings in urine: Secondary | ICD-10-CM | POA: Diagnosis not present

## 2017-12-09 DIAGNOSIS — B9620 Unspecified Escherichia coli [E. coli] as the cause of diseases classified elsewhere: Secondary | ICD-10-CM | POA: Diagnosis not present

## 2017-12-09 DIAGNOSIS — R799 Abnormal finding of blood chemistry, unspecified: Secondary | ICD-10-CM | POA: Diagnosis not present

## 2017-12-09 DIAGNOSIS — M6281 Muscle weakness (generalized): Secondary | ICD-10-CM | POA: Diagnosis not present

## 2017-12-09 DIAGNOSIS — A499 Bacterial infection, unspecified: Secondary | ICD-10-CM | POA: Diagnosis not present

## 2017-12-12 DIAGNOSIS — R41840 Attention and concentration deficit: Secondary | ICD-10-CM | POA: Diagnosis not present

## 2017-12-12 DIAGNOSIS — L89610 Pressure ulcer of right heel, unstageable: Secondary | ICD-10-CM | POA: Diagnosis not present

## 2017-12-12 DIAGNOSIS — M6281 Muscle weakness (generalized): Secondary | ICD-10-CM | POA: Diagnosis not present

## 2017-12-12 DIAGNOSIS — R410 Disorientation, unspecified: Secondary | ICD-10-CM | POA: Diagnosis not present

## 2017-12-17 DIAGNOSIS — R5382 Chronic fatigue, unspecified: Secondary | ICD-10-CM | POA: Diagnosis not present

## 2017-12-17 DIAGNOSIS — D649 Anemia, unspecified: Secondary | ICD-10-CM | POA: Diagnosis not present

## 2017-12-17 DIAGNOSIS — M6281 Muscle weakness (generalized): Secondary | ICD-10-CM | POA: Diagnosis not present

## 2017-12-19 DIAGNOSIS — L89610 Pressure ulcer of right heel, unstageable: Secondary | ICD-10-CM | POA: Diagnosis not present

## 2017-12-20 DIAGNOSIS — D649 Anemia, unspecified: Secondary | ICD-10-CM | POA: Diagnosis not present

## 2017-12-21 ENCOUNTER — Other Ambulatory Visit: Payer: Self-pay

## 2017-12-21 ENCOUNTER — Inpatient Hospital Stay (HOSPITAL_COMMUNITY)
Admission: EM | Admit: 2017-12-21 | Discharge: 2017-12-23 | DRG: 812 | Disposition: A | Discharge status: Skilled Nursing Facility | Payer: Medicare Other | Attending: Internal Medicine | Admitting: Internal Medicine

## 2017-12-21 ENCOUNTER — Encounter (HOSPITAL_COMMUNITY): Payer: Self-pay | Admitting: Oncology

## 2017-12-21 DIAGNOSIS — I2510 Atherosclerotic heart disease of native coronary artery without angina pectoris: Secondary | ICD-10-CM | POA: Diagnosis present

## 2017-12-21 DIAGNOSIS — G20 Parkinson's disease: Secondary | ICD-10-CM | POA: Diagnosis not present

## 2017-12-21 DIAGNOSIS — F0281 Dementia in other diseases classified elsewhere with behavioral disturbance: Secondary | ICD-10-CM | POA: Diagnosis not present

## 2017-12-21 DIAGNOSIS — D464 Refractory anemia, unspecified: Principal | ICD-10-CM | POA: Diagnosis present

## 2017-12-21 DIAGNOSIS — R4182 Altered mental status, unspecified: Secondary | ICD-10-CM | POA: Diagnosis not present

## 2017-12-21 DIAGNOSIS — D649 Anemia, unspecified: Secondary | ICD-10-CM

## 2017-12-21 DIAGNOSIS — D469 Myelodysplastic syndrome, unspecified: Secondary | ICD-10-CM | POA: Diagnosis present

## 2017-12-21 DIAGNOSIS — D62 Acute posthemorrhagic anemia: Secondary | ICD-10-CM | POA: Diagnosis present

## 2017-12-21 DIAGNOSIS — E871 Hypo-osmolality and hyponatremia: Secondary | ICD-10-CM | POA: Diagnosis present

## 2017-12-21 DIAGNOSIS — I10 Essential (primary) hypertension: Secondary | ICD-10-CM | POA: Diagnosis present

## 2017-12-21 DIAGNOSIS — Z87891 Personal history of nicotine dependence: Secondary | ICD-10-CM

## 2017-12-21 DIAGNOSIS — Z96641 Presence of right artificial hip joint: Secondary | ICD-10-CM | POA: Diagnosis present

## 2017-12-21 DIAGNOSIS — K219 Gastro-esophageal reflux disease without esophagitis: Secondary | ICD-10-CM | POA: Diagnosis present

## 2017-12-21 DIAGNOSIS — Z8673 Personal history of transient ischemic attack (TIA), and cerebral infarction without residual deficits: Secondary | ICD-10-CM

## 2017-12-21 DIAGNOSIS — H409 Unspecified glaucoma: Secondary | ICD-10-CM | POA: Diagnosis present

## 2017-12-21 DIAGNOSIS — E785 Hyperlipidemia, unspecified: Secondary | ICD-10-CM | POA: Diagnosis present

## 2017-12-21 DIAGNOSIS — Z79899 Other long term (current) drug therapy: Secondary | ICD-10-CM

## 2017-12-21 DIAGNOSIS — Z7982 Long term (current) use of aspirin: Secondary | ICD-10-CM

## 2017-12-21 MED ORDER — PANTOPRAZOLE SODIUM 40 MG IV SOLR
40.0000 mg | Freq: Once | INTRAVENOUS | Status: AC
  Administered 2017-12-22: 40 mg via INTRAVENOUS
  Filled 2017-12-21: qty 40

## 2017-12-21 NOTE — ED Triage Notes (Signed)
Pt bib PTAR from [facility] care d/t low Hgb.  Per labs from facility pt's Hgb is 6.4.  Pt is alert to person only, this is his baseline.

## 2017-12-22 DIAGNOSIS — D469 Myelodysplastic syndrome, unspecified: Secondary | ICD-10-CM | POA: Diagnosis not present

## 2017-12-22 DIAGNOSIS — D464 Refractory anemia, unspecified: Secondary | ICD-10-CM | POA: Diagnosis present

## 2017-12-22 DIAGNOSIS — F0281 Dementia in other diseases classified elsewhere with behavioral disturbance: Secondary | ICD-10-CM | POA: Diagnosis not present

## 2017-12-22 DIAGNOSIS — G20 Parkinson's disease: Secondary | ICD-10-CM | POA: Diagnosis not present

## 2017-12-22 DIAGNOSIS — K219 Gastro-esophageal reflux disease without esophagitis: Secondary | ICD-10-CM | POA: Diagnosis present

## 2017-12-22 DIAGNOSIS — I10 Essential (primary) hypertension: Secondary | ICD-10-CM | POA: Diagnosis not present

## 2017-12-22 DIAGNOSIS — D62 Acute posthemorrhagic anemia: Secondary | ICD-10-CM | POA: Diagnosis present

## 2017-12-22 DIAGNOSIS — Z8673 Personal history of transient ischemic attack (TIA), and cerebral infarction without residual deficits: Secondary | ICD-10-CM | POA: Diagnosis not present

## 2017-12-22 DIAGNOSIS — Z7982 Long term (current) use of aspirin: Secondary | ICD-10-CM | POA: Diagnosis not present

## 2017-12-22 DIAGNOSIS — D649 Anemia, unspecified: Secondary | ICD-10-CM | POA: Diagnosis not present

## 2017-12-22 DIAGNOSIS — H409 Unspecified glaucoma: Secondary | ICD-10-CM | POA: Diagnosis present

## 2017-12-22 DIAGNOSIS — Z87891 Personal history of nicotine dependence: Secondary | ICD-10-CM | POA: Diagnosis not present

## 2017-12-22 DIAGNOSIS — E871 Hypo-osmolality and hyponatremia: Secondary | ICD-10-CM | POA: Diagnosis present

## 2017-12-22 DIAGNOSIS — Z96641 Presence of right artificial hip joint: Secondary | ICD-10-CM | POA: Diagnosis present

## 2017-12-22 DIAGNOSIS — Z79899 Other long term (current) drug therapy: Secondary | ICD-10-CM | POA: Diagnosis not present

## 2017-12-22 DIAGNOSIS — E785 Hyperlipidemia, unspecified: Secondary | ICD-10-CM | POA: Diagnosis present

## 2017-12-22 DIAGNOSIS — I2510 Atherosclerotic heart disease of native coronary artery without angina pectoris: Secondary | ICD-10-CM | POA: Diagnosis not present

## 2017-12-22 DIAGNOSIS — R4182 Altered mental status, unspecified: Secondary | ICD-10-CM | POA: Diagnosis not present

## 2017-12-22 LAB — CBC
HCT: 19 % — ABNORMAL LOW (ref 39.0–52.0)
HCT: 21.1 % — ABNORMAL LOW (ref 39.0–52.0)
HEMATOCRIT: 26.3 % — AB (ref 39.0–52.0)
HEMOGLOBIN: 6.3 g/dL — AB (ref 13.0–17.0)
HEMOGLOBIN: 6.9 g/dL — AB (ref 13.0–17.0)
Hemoglobin: 9.2 g/dL — ABNORMAL LOW (ref 13.0–17.0)
MCH: 29.9 pg (ref 26.0–34.0)
MCH: 30 pg (ref 26.0–34.0)
MCH: 31 pg (ref 26.0–34.0)
MCHC: 32.7 g/dL (ref 30.0–36.0)
MCHC: 33.2 g/dL (ref 30.0–36.0)
MCHC: 35 g/dL (ref 30.0–36.0)
MCV: 88.6 fL (ref 78.0–100.0)
MCV: 90.5 fL (ref 78.0–100.0)
MCV: 91.3 fL (ref 78.0–100.0)
PLATELETS: 409 10*3/uL — AB (ref 150–400)
PLATELETS: 374 10*3/uL (ref 150–400)
Platelets: 440 10*3/uL — ABNORMAL HIGH (ref 150–400)
RBC: 2.1 MIL/uL — ABNORMAL LOW (ref 4.22–5.81)
RBC: 2.31 MIL/uL — ABNORMAL LOW (ref 4.22–5.81)
RBC: 2.97 MIL/uL — ABNORMAL LOW (ref 4.22–5.81)
RDW: 18.4 % — AB (ref 11.5–15.5)
RDW: 19.8 % — AB (ref 11.5–15.5)
RDW: 22.2 % — ABNORMAL HIGH (ref 11.5–15.5)
WBC: 4.7 10*3/uL (ref 4.0–10.5)
WBC: 5.4 10*3/uL (ref 4.0–10.5)
WBC: 5.7 10*3/uL (ref 4.0–10.5)

## 2017-12-22 LAB — HEPATIC FUNCTION PANEL
ALBUMIN: 2.5 g/dL — AB (ref 3.5–5.0)
ALK PHOS: 63 U/L (ref 38–126)
ALT: 10 U/L — AB (ref 17–63)
AST: 34 U/L (ref 15–41)
Bilirubin, Direct: 0.2 mg/dL (ref 0.1–0.5)
Indirect Bilirubin: 0.5 mg/dL (ref 0.3–0.9)
TOTAL PROTEIN: 5 g/dL — AB (ref 6.5–8.1)
Total Bilirubin: 0.7 mg/dL (ref 0.3–1.2)

## 2017-12-22 LAB — VITAMIN B12: VITAMIN B 12: 1326 pg/mL — AB (ref 180–914)

## 2017-12-22 LAB — BASIC METABOLIC PANEL
Anion gap: 5 (ref 5–15)
BUN: 29 mg/dL — AB (ref 6–20)
CALCIUM: 8.2 mg/dL — AB (ref 8.9–10.3)
CO2: 23 mmol/L (ref 22–32)
CREATININE: 0.87 mg/dL (ref 0.61–1.24)
Chloride: 109 mmol/L (ref 101–111)
GFR calc Af Amer: >60 mL/min (ref >60)
Glucose, Bld: 102 mg/dL — ABNORMAL HIGH (ref 65–99)
Potassium: 4.6 mmol/L (ref 3.5–5.1)
SODIUM: 137 mmol/L (ref 135–145)

## 2017-12-22 LAB — IRON AND TIBC
Iron: 106 ug/dL (ref 45–182)
SATURATION RATIOS: 102 % — AB (ref 17.9–39.5)
TIBC: 104 ug/dL — ABNORMAL LOW (ref 250–450)

## 2017-12-22 LAB — I-STAT CHEM 8, ED
BUN: 32 mg/dL — ABNORMAL HIGH (ref 6–20)
CHLORIDE: 105 mmol/L (ref 101–111)
Calcium, Ion: 1.17 mmol/L (ref 1.15–1.40)
Creatinine, Ser: 0.9 mg/dL (ref 0.61–1.24)
Glucose, Bld: 109 mg/dL — ABNORMAL HIGH (ref 65–99)
HEMATOCRIT: 16 % — AB (ref 39.0–52.0)
Hemoglobin: 5.4 g/dL — CL (ref 13.0–17.0)
POTASSIUM: 4.6 mmol/L (ref 3.5–5.1)
SODIUM: 136 mmol/L (ref 135–145)
TCO2: 22 mmol/L (ref 22–32)

## 2017-12-22 LAB — POC OCCULT BLOOD, ED
FECAL OCCULT BLD: NEGATIVE
Fecal Occult Bld: NEGATIVE

## 2017-12-22 LAB — PREPARE RBC (CROSSMATCH)

## 2017-12-22 LAB — APTT: aPTT: 24 seconds (ref 24–36)

## 2017-12-22 LAB — FOLATE: FOLATE: 10.4 ng/mL (ref >5.9)

## 2017-12-22 LAB — PROTIME-INR
INR: 1.09
Prothrombin Time: 14 seconds (ref 11.4–15.2)

## 2017-12-22 LAB — FERRITIN: Ferritin: 2550 ng/mL — ABNORMAL HIGH (ref 24–336)

## 2017-12-22 LAB — MRSA PCR SCREENING: MRSA BY PCR: NEGATIVE

## 2017-12-22 MED ORDER — MEMANTINE HCL 10 MG PO TABS
10.0000 mg | ORAL_TABLET | Freq: Two times a day (BID) | ORAL | Status: DC
  Administered 2017-12-22 – 2017-12-23 (×3): 10 mg via ORAL
  Filled 2017-12-22 (×3): qty 1

## 2017-12-22 MED ORDER — ACETAMINOPHEN 650 MG RE SUPP: 650.0000 mg | Freq: Four times a day (QID) | RECTAL | Status: DC | PRN

## 2017-12-22 MED ORDER — ASPIRIN EC 81 MG PO TBEC
81.0000 mg | DELAYED_RELEASE_TABLET | Freq: Two times a day (BID) | ORAL | Status: DC
  Administered 2017-12-22 – 2017-12-23 (×3): 81 mg via ORAL
  Filled 2017-12-22 (×3): qty 1

## 2017-12-22 MED ORDER — ONDANSETRON HCL 4 MG PO TABS: 4.0000 mg | ORAL_TABLET | Freq: Four times a day (QID) | ORAL | Status: DC | PRN

## 2017-12-22 MED ORDER — CARBIDOPA-LEVODOPA 25-100 MG PO TABS
0.5000 | ORAL_TABLET | Freq: Three times a day (TID) | ORAL | Status: DC
  Administered 2017-12-22 – 2017-12-23 (×5): 0.5 via ORAL
  Filled 2017-12-22 (×5): qty 1

## 2017-12-22 MED ORDER — LATANOPROST 0.005 % OP SOLN: 1.0000 [drp] | Freq: Every day | OPHTHALMIC | Status: DC

## 2017-12-22 MED ORDER — VITAMIN B-12 100 MCG PO TABS
100.0000 ug | ORAL_TABLET | Freq: Every day | ORAL | Status: DC
  Administered 2017-12-22 – 2017-12-23 (×2): 100 ug via ORAL
  Filled 2017-12-22 (×2): qty 1

## 2017-12-22 MED ORDER — SODIUM CHLORIDE 0.9 % IV SOLN
Freq: Once | INTRAVENOUS | Status: AC
  Administered 2017-12-22: 12:00:00 via INTRAVENOUS

## 2017-12-22 MED ORDER — ALBUTEROL SULFATE (2.5 MG/3ML) 0.083% IN NEBU: 2.5000 mg | INHALATION_SOLUTION | Freq: Four times a day (QID) | RESPIRATORY_TRACT | Status: DC | PRN

## 2017-12-22 MED ORDER — SODIUM CHLORIDE 1 G PO TABS
1.0000 g | ORAL_TABLET | Freq: Two times a day (BID) | ORAL | Status: DC
  Administered 2017-12-22 – 2017-12-23 (×4): 1 g via ORAL
  Filled 2017-12-22 (×4): qty 1

## 2017-12-22 MED ORDER — ONDANSETRON HCL 4 MG/2ML IJ SOLN: 4.0000 mg | Freq: Four times a day (QID) | INTRAMUSCULAR | Status: DC | PRN

## 2017-12-22 MED ORDER — DORZOLAMIDE HCL 2 % OP SOLN
1.0000 [drp] | Freq: Two times a day (BID) | OPHTHALMIC | Status: DC
  Administered 2017-12-22 – 2017-12-23 (×3): 1 [drp] via OPHTHALMIC
  Filled 2017-12-22: qty 10

## 2017-12-22 MED ORDER — ACETAMINOPHEN 325 MG PO TABS: 650.0000 mg | ORAL_TABLET | Freq: Four times a day (QID) | ORAL | Status: DC | PRN

## 2017-12-22 MED ORDER — LATANOPROST 0.005 % OP SOLN
1.0000 [drp] | Freq: Every day | OPHTHALMIC | Status: DC
  Administered 2017-12-22: 1 [drp] via OPHTHALMIC
  Filled 2017-12-22: qty 2.5

## 2017-12-22 MED ORDER — SODIUM CHLORIDE 0.9 % IV SOLN
INTRAVENOUS | Status: DC
  Administered 2017-12-22: 03:00:00 via INTRAVENOUS

## 2017-12-22 MED ORDER — SIMVASTATIN 40 MG PO TABS
40.0000 mg | ORAL_TABLET | Freq: Every day | ORAL | Status: DC
  Administered 2017-12-22: 40 mg via ORAL
  Filled 2017-12-22: qty 1

## 2017-12-22 MED ORDER — DOCUSATE SODIUM 100 MG PO CAPS
100.0000 mg | ORAL_CAPSULE | Freq: Two times a day (BID) | ORAL | Status: DC
Start: 2017-12-22 — End: 2017-12-23
  Administered 2017-12-22 – 2017-12-23 (×3): 100 mg via ORAL
  Filled 2017-12-22 (×3): qty 1

## 2017-12-22 MED ORDER — HYDROCODONE-ACETAMINOPHEN 5-325 MG PO TABS: 1.0000 | ORAL_TABLET | Freq: Four times a day (QID) | ORAL | Status: DC | PRN

## 2017-12-22 MED ORDER — SODIUM CHLORIDE 0.9 % IV SOLN
10.0000 mL/h | Freq: Once | INTRAVENOUS | Status: AC
Start: 2017-12-22 — End: 2017-12-22
  Administered 2017-12-22: 10 mL/h via INTRAVENOUS

## 2017-12-22 MED ORDER — PANTOPRAZOLE SODIUM 40 MG IV SOLR
40.0000 mg | Freq: Two times a day (BID) | INTRAVENOUS | Status: DC
  Administered 2017-12-22 – 2017-12-23 (×3): 40 mg via INTRAVENOUS
  Filled 2017-12-22 (×3): qty 40

## 2017-12-22 NOTE — ED Notes (Signed)
Blood consent obtained via phone from pt's daughter and HCPOA Jaelan Rasheed.  [name] gave this Probation officer as well as Seth Bake, RN permission to give her father a blood transfusion.

## 2017-12-22 NOTE — H&P (Addendum)
History and Physical    Alexander Rice WVP:[id number] DOB: [birth year] DOA: 12/21/2017  Referring MD/NP/PA: Dr. Kathrynn Humble PCP: Marin Olp, MD  Patient coming from: [facility] via EMS   Chief Complaint: Low hemoglobin  I have personally briefly reviewed patient's old medical records in [place]   HPI: Alexander Rice is a 84 y.o. male with medical history significant of  MDS, HTN, HLD, followed by Dr. Marin Olp, anemia, CAD, CVA, Parkinson's dz, dementia, hyponatremia, diverticulosis; who presents after being found to have low hemoglobin at skilled nursing facility. History is obtained from review of records as patient has significant dementia and unable to provide his own. He was just recently admitted to the hospital from 11/12/2017- 11/19/2017 after a fall sustaining a closed displaced right femoral neck fracture status post surgical correction. He had required a blood transfusion during that hospitalization.   ED Course: Upon admission into the emergency department patient was noted to have a pulse of 66-83, respirations 15-22, blood pressure 107/67-144/62, and O2 saturation maintained on room air.  Labs revealed hemoglobin 6.3, platelets 448, BUN 32, and creatinine 0.9.  Anemia panel was pending.  Patient was typed and screened for blood products, given 40 mg of Protonix IV, and ordered to be transfused 2 units of PRBCs.  TRH called to admit  Review of Systems  Unable to perform ROS: Dementia    Past Medical History:  Diagnosis Date  . Adenomatous colon polyp 02/1996, 02/2011   TA polyp 02/2011  . Anemia in chronic renal disease 11/02/2011  . B12 deficiency   . BPH (benign prostatic hyperplasia)   . CAD (coronary artery disease)   . CVD (cardiovascular disease)   . Dementia   . Diverticulosis   . Esophageal stricture   . GERD (gastroesophageal reflux disease)   . Glaucoma   . Heart palpitations   . Hemorrhoids   . Hiatal hernia   . History of colonic  polyps 09/29/2006   Polyps age 89, Dr. Fuller Plan stated no further colonoscopy due to age. Confirmed with daughter given alzheimers, CAD history    . Hyperlipidemia   . Hypertension   . MDS (myelodysplastic syndrome) ([place])     Past Surgical History:  Procedure Laterality Date  . CAROTID ENDARTERECTOMY    . CHOLECYSTECTOMY N/A 09/07/2017   Procedure: LAPAROSCOPIC CHOLECYSTECTOMY WITH INTRAOPERATIVE CHOLANGIOGRAM, REPAIR OF CHOLECYSTODUODENAL FISTULA, UPPER ENDOSCOPY;  Surgeon: Alphonsa Overall, MD;  Location: [facility];  Service: General;  Laterality: N/A;  . COLONOSCOPY    . ERCP N/A 09/06/2017   Procedure: ENDOSCOPIC RETROGRADE CHOLANGIOPANCREATOGRAPHY (ERCP);  Surgeon: Milus Banister, MD;  Location: [facility];  Service: Endoscopy;  Laterality: N/A;  . GLAUCOMA SURGERY Bilateral 11/19/12   pt's report  . HIP ARTHROPLASTY Right 11/16/2017   Procedure: ARTHROPLASTY BIPOLAR HIP (HEMIARTHROPLASTY) RIGHT;  Surgeon: Nicholes Stairs, MD;  Location: [facility];  Service: Orthopedics;  Laterality: Right;  . INGUINAL HERNIA REPAIR     right side/ twice  . PTCA     stent  . QUADRICEPS REPAIR     muscle attachment  . TONSILLECTOMY       reports that he quit smoking about 49 years ago. he has never used smokeless tobacco. He reports that he drinks about 4.2 oz of alcohol per week. He reports that he does not use drugs.  No Known Allergies  Family History  Problem Relation Age of Onset  . COPD Mother   . Alcohol abuse Father  Prior to Admission medications   Medication Sig Start Date End Date Taking? Authorizing Provider  amLODipine (NORVASC) 5 MG tablet Take 1 tablet (5 mg total) by mouth daily. 11/20/17   Marene Lenz, MD  aspirin 81 MG tablet Take 1 tablet (81 mg total) by mouth 2 (two) times daily. 11/19/17   Marene Lenz, MD  carbidopa-levodopa (SINEMET IR) 25-100 MG tablet Take 1/2 tablet three times a day Patient taking differently: Take 0.5 tablets by mouth 3 (three) times  daily. Take 1/2 tablet three times a day 07/08/17   Cameron Sprang, MD  [employer] OMEGA-3 KRILL OIL PO Take 350 mg by mouth daily. Reported on 01/16/2016    [provider]  cyanocobalamin 100 MCG tablet Take 100 mcg by mouth daily.     [provider]  docusate sodium (COLACE) 100 MG capsule Take 1 capsule (100 mg total) by mouth 2 (two) times daily. 09/12/17   Hosie Poisson, MD  dorzolamide (TRUSOPT) 2 % ophthalmic solution Place 1 drop into both eyes 2 (two) times daily.      [provider]  doxazosin (CARDURA) 4 MG tablet TAKE 1 TABLET BY MOUTH AT  BEDTIME 10/01/17   Marin Olp, MD  epoetin alfa (PROCRIT) 94496 UNIT/ML injection Inject 1 mL (40,000 Units total) into the skin every 21 ( twenty-one) days. As needed for Hgb < 10g/dl 09/13/17   [place], Holli Humbles, NP  HYDROcodone-acetaminophen (NORCO/VICODIN) 5-325 MG tablet Take 1 tablet by mouth every 6 (six) hours as needed for moderate pain. 11/18/17   Constable, Amber, PA-C  lisinopril (PRINIVIL,ZESTRIL) 5 MG tablet TAKE 1 TABLET BY MOUTH  DAILY 05/14/17   Marin Olp, MD  LUMIGAN 0.01 % SOLN Place 1 drop into both eyes at bedtime.  03/23/11   [provider]  memantine (NAMENDA) 10 MG tablet TAKE 1 TABLET BY MOUTH TWO  TIMES DAILY 05/01/17   Cameron Sprang, MD  Multiple Vitamin (MULTIVITAMIN) tablet Take 1 tablet by mouth daily.      [provider]  omeprazole (PRILOSEC) 20 MG capsule TAKE 1 CAPSULE BY MOUTH  DAILY 03/26/17   Marin Olp, MD  Probiotic Product ([employer] PROBIOTIC) CHEW Chew 1 tablet by mouth daily.     [provider]  Pyridoxine HCl (VITAMIN B-6) 250 MG tablet Take 1 tablet (250 mg total) by mouth daily. 05/17/15   [name], MD  simvastatin (ZOCOR) 40 MG tablet Hold for 2 to 4 weeks and recheck liver function tests at that time and if stable, can be restarted as per discretion of the PCP. Patient taking differently: Take 40 mg by mouth at bedtime.  09/12/17    Hosie Poisson, MD  sodium chloride 1 g tablet Take 1 tablet (1 g total) by mouth 2 (two) times daily with a meal. 11/19/17   Marene Lenz, MD    Physical Exam:  Constitutional: Elderly male who intermittently yells out when no one is around as though he is seeing things Vitals:   12/21/17 2342 12/21/17 2345 12/22/17 0030 12/22/17 0115  BP: (!) 114/46 107/67 138/63 (!) 144/62  Pulse: 66 71 72 83  Resp: (!) 22 15 15  (!) 21  SpO2: 98% 100% 97% 95%   Eyes: PERRL, lids and conjunctivae normal ENMT: Mucous membranes are moist. Posterior pharynx clear of any exudate or lesions.  Neck: normal, supple, no masses, no thyromegaly Respiratory: clear to auscultation bilaterally, no wheezing, no crackles. Normal respiratory effort. No accessory  muscle use.  Cardiovascular: Regular rate and rhythm, no murmurs / rubs / gallops. No extremity edema. 2+ pedal pulses. No carotid bruits.  Abdomen: no tenderness, no masses palpated. No hepatosplenomegaly. Bowel sounds positive.  Musculoskeletal: no clubbing / cyanosis. No joint deformity upper and lower extremities. Good ROM, no contractures. Normal muscle tone.   Skin: Wound from hip surgery appears to be healing.  Pressure ulcer and noted Neurologic: CN 2-12 grossly intact. Sensation intact, DTR normal. Strength 5/5 in all 4.  Psychiatric: Dementia.  Alert and oriented x 1.  Intermittently appears agitated when no one is in the room.    Labs on Admission: I have personally reviewed following labs and imaging studies  CBC: Recent Labs  Lab 12/21/17 2339 12/22/17 0033  WBC 5.4  --   HGB 6.3* 5.4*  HCT 19.0* 16.0*  MCV 90.5  --   PLT 440*  --    Basic Metabolic Panel: Recent Labs  Lab 12/22/17 0033  NA 136  K 4.6  CL 105  GLUCOSE 109*  BUN 32*  CREATININE 0.90   GFR: CrCl cannot be calculated (Unknown ideal weight.). Liver Function Tests: Recent Labs  Lab 12/21/17 2339  AST 34  ALT 10*  ALKPHOS 63  BILITOT 0.7  PROT 5.0*    ALBUMIN 2.5*   No results for input(s): LIPASE, AMYLASE in the last 168 hours. No results for input(s): AMMONIA in the last 168 hours. Coagulation Profile: Recent Labs  Lab 12/21/17 2339  INR 1.09   Cardiac Enzymes: No results for input(s): CKTOTAL, CKMB, CKMBINDEX, TROPONINI in the last 168 hours. BNP (last 3 results) No results for input(s): PROBNP in the last 8760 hours. HbA1C: No results for input(s): HGBA1C in the last 72 hours. CBG: No results for input(s): GLUCAP in the last 168 hours. Lipid Profile: No results for input(s): CHOL, HDL, LDLCALC, TRIG, CHOLHDL, LDLDIRECT in the last 72 hours. Thyroid Function Tests: No results for input(s): TSH, T4TOTAL, FREET4, T3FREE, THYROIDAB in the last 72 hours. Anemia Panel: No results for input(s): VITAMINB12, FOLATE, FERRITIN, TIBC, IRON, RETICCTPCT in the last 72 hours. Urine analysis:    Component Value Date/Time   COLORURINE YELLOW 11/13/2017 0240   APPEARANCEUR CLEAR 11/13/2017 0240   LABSPEC 1.015 11/13/2017 0240   PHURINE 6.0 11/13/2017 0240   GLUCOSEU NEGATIVE 11/13/2017 0240   HGBUR NEGATIVE 11/13/2017 0240   HGBUR negative 10/11/2009 0819   BILIRUBINUR NEGATIVE 11/13/2017 0240   BILIRUBINUR n 04/18/2016 1152   KETONESUR 5 (A) 11/13/2017 0240   PROTEINUR NEGATIVE 11/13/2017 0240   UROBILINOGEN 1.0 04/18/2016 1152   UROBILINOGEN 0.2 10/11/2009 0819   NITRITE NEGATIVE 11/13/2017 0240   LEUKOCYTESUR NEGATIVE 11/13/2017 0240   Sepsis Labs: No results found for this or any previous visit (from the past 240 hour(s)).   Radiological Exams on Admission: No results found.  EKG: Independently reviewed.  Normal sinus rhythm at 67 bpm  Assessment/Plan Refractory anemia/question acute blood loss, Myelodysplastic syndrome: Patient found to have low hemoglobin at nursing facility and was sent in for further evaluation.  Baseline hemoglobin appears to range from 8-9 but presents with a hemoglobin of 6.3. Patient followed by  Dr. Marin Olp on requiring previous blood transfusions and infusions of Procrit. Stool guaiacs noted to be negative, but BUN mildly elevated at 32.  Question possibility of GI bleed vs refractory anemia related to myelodysplastic syndrome.  Patient was given Protonix 40 mg IV and was ordered to be transfused 2 units of PRBCs. - Admit to  a telemetry bed - Continue with transfusion of 2 units of PRBCs - Recheck hemoglobin posttransfusion - Continue to monitor and transfuse blood as needed goal hemoglobin of at least 8 - N.p.o. and advance diet when medically appropriate - Protonix IV twice daily - Follow-up anemia panel - Dr. Marin Olp added to treatment team  Parkinson's disease, dementia with behavioral disturbance - Continue Namenda and Sinemet      Essential hypertension: Blood pressures noted to be low normal on admission and with patient's anemia will hold continuing at this time. - Restart blood pressure medications when medically appropriate  Recent right hip replacement: Patient was at a skilled nursing facility receiving rehab. - Likely need to be discharged back to rehab once able - Continue hydrocodone prn pain          CAD - Continue aspirin  Hyponatremia: Resolved.  Sodium initially 136, but patient on sodium chloride tablets - Continue sodium chloride tablets               Hyperlipidemia - Continue simvastatin      DVT prophylaxis: SCD  Code Status: Full  Family Communication: No family present at bedside Disposition Plan: tbd  Consults called: none  Admission status: observation  Norval Morton MD [facility] Pager [phone]   If 7PM-7AM, please contact night-coverage [email or web address] Password [email or web address]  12/22/2017, 1:24 AM

## 2017-12-22 NOTE — ED Provider Notes (Signed)
[place] EMERGENCY DEPARTMENT Provider Note   CSN: [id number] Arrival date & time: 12/21/17  2317     History   Chief Complaint Chief Complaint  Patient presents with  . Abnormal Labs    HPI Alexander Rice is a 84 y.o. male.  HPI 84 year old male with history of GERD, diverticulosis, CAD, CVD, myelodysplastic syndrome comes in with chief complaint of abnormal hemoglobin.  Level 5 caveat for severe dementia.  Patient has no complaints from his side. Specifically, patient denies any chest pain, shortness of breath, dizziness, fainting.  Patient also denies any bloody stools.   Past Medical History:  Diagnosis Date  . Adenomatous colon polyp 02/1996, 02/2011   TA polyp 02/2011  . Anemia in chronic renal disease 11/02/2011  . B12 deficiency   . BPH (benign prostatic hyperplasia)   . CAD (coronary artery disease)   . CVD (cardiovascular disease)   . Dementia   . Diverticulosis   . Esophageal stricture   . GERD (gastroesophageal reflux disease)   . Glaucoma   . Heart palpitations   . Hemorrhoids   . Hiatal hernia   . History of colonic polyps 09/29/2006   Polyps age 69, Dr. Fuller Plan stated no further colonoscopy due to age. Confirmed with daughter given alzheimers, CAD history    . Hyperlipidemia   . Hypertension   . MDS (myelodysplastic syndrome) [facility])     Patient Active Problem List   Diagnosis Date Noted  . Pressure injury of skin 11/16/2017  . Dementia without behavioral disturbance 11/15/2017  . Closed displaced fracture of right femoral neck ([place]) 11/12/2017  . Choleduodenal fistula s/p cholecystectomy/duodenal resection 09/07/2017 09/07/2017  . Choledocholithiasis   . Fall 09/04/2017  . AAA (abdominal aortic aneurysm) ([place]) 09/04/2017  . Bradycardia 04/27/2016  . Unresponsiveness 04/27/2016  . Severe dementia 03/31/2015  . Spinal stenosis of lumbar region 03/31/2015  . Hyponatremia 03/22/2015  . Pernicious anemia 03/11/2015  .  Glaucoma 12/01/2014  . Pulmonary nodule 04/08/2012  . MDS (myelodysplastic syndrome) ([place]) 05/14/2011  . ANEMIA, B12 DEFICIENCY 07/03/2007  . Hyperlipidemia 09/29/2006  . Essential hypertension 09/29/2006  . CAD (coronary artery disease) 09/29/2006  . GERD 09/29/2006  . BPH (benign prostatic hyperplasia) 09/29/2006    Past Surgical History:  Procedure Laterality Date  . CAROTID ENDARTERECTOMY    . CHOLECYSTECTOMY N/A 09/07/2017   Procedure: LAPAROSCOPIC CHOLECYSTECTOMY WITH INTRAOPERATIVE CHOLANGIOGRAM, REPAIR OF CHOLECYSTODUODENAL FISTULA, UPPER ENDOSCOPY;  Surgeon: Alphonsa Overall, MD;  Location: [facility];  Service: General;  Laterality: N/A;  . COLONOSCOPY    . ERCP N/A 09/06/2017   Procedure: ENDOSCOPIC RETROGRADE CHOLANGIOPANCREATOGRAPHY (ERCP);  Surgeon: Milus Banister, MD;  Location: [facility];  Service: Endoscopy;  Laterality: N/A;  . GLAUCOMA SURGERY Bilateral 11/19/12   pt's report  . HIP ARTHROPLASTY Right 11/16/2017   Procedure: ARTHROPLASTY BIPOLAR HIP (HEMIARTHROPLASTY) RIGHT;  Surgeon: Nicholes Stairs, MD;  Location: [facility];  Service: Orthopedics;  Laterality: Right;  . INGUINAL HERNIA REPAIR     right side/ twice  . PTCA     stent  . QUADRICEPS REPAIR     muscle attachment  . TONSILLECTOMY         Home Medications    Prior to Admission medications   Medication Sig Start Date End Date Taking? Authorizing Provider  amLODipine (NORVASC) 5 MG tablet Take 1 tablet (5 mg total) by mouth daily. 11/20/17   Marene Lenz, MD  aspirin 81 MG tablet Take 1 tablet (81 mg total) by  mouth 2 (two) times daily. 11/19/17   Marene Lenz, MD  carbidopa-levodopa (SINEMET IR) 25-100 MG tablet Take 1/2 tablet three times a day Patient taking differently: Take 0.5 tablets by mouth 3 (three) times daily. Take 1/2 tablet three times a day 07/08/17   Cameron Sprang, MD  [facility] OMEGA-3 KRILL OIL PO Take 350 mg by mouth daily. Reported on 01/16/2016    [provider]    cyanocobalamin 100 MCG tablet Take 100 mcg by mouth daily.     [provider]  docusate sodium (COLACE) 100 MG capsule Take 1 capsule (100 mg total) by mouth 2 (two) times daily. 09/12/17   Hosie Poisson, MD  dorzolamide (TRUSOPT) 2 % ophthalmic solution Place 1 drop into both eyes 2 (two) times daily.      [provider]  doxazosin (CARDURA) 4 MG tablet TAKE 1 TABLET BY MOUTH AT  BEDTIME 10/01/17   Marin Olp, MD  epoetin alfa (PROCRIT) 91478 UNIT/ML injection Inject 1 mL (40,000 Units total) into the skin every 21 ( twenty-one) days. As needed for Hgb < 10g/dl 09/13/17   [place], Holli Humbles, NP  HYDROcodone-acetaminophen (NORCO/VICODIN) 5-325 MG tablet Take 1 tablet by mouth every 6 (six) hours as needed for moderate pain. 11/18/17   Constable, Amber, PA-C  lisinopril (PRINIVIL,ZESTRIL) 5 MG tablet TAKE 1 TABLET BY MOUTH  DAILY 05/14/17   Marin Olp, MD  LUMIGAN 0.01 % SOLN Place 1 drop into both eyes at bedtime.  03/23/11   [provider]  memantine (NAMENDA) 10 MG tablet TAKE 1 TABLET BY MOUTH TWO  TIMES DAILY 05/01/17   Cameron Sprang, MD  Multiple Vitamin (MULTIVITAMIN) tablet Take 1 tablet by mouth daily.      [provider]  omeprazole (PRILOSEC) 20 MG capsule TAKE 1 CAPSULE BY MOUTH  DAILY 03/26/17   Marin Olp, MD  Probiotic Product ([employer] PROBIOTIC) CHEW Chew 1 tablet by mouth daily.     [provider]  Pyridoxine HCl (VITAMIN B-6) 250 MG tablet Take 1 tablet (250 mg total) by mouth daily. 05/17/15   [name], MD  simvastatin (ZOCOR) 40 MG tablet Hold for 2 to 4 weeks and recheck liver function tests at that time and if stable, can be restarted as per discretion of the PCP. Patient taking differently: Take 40 mg by mouth at bedtime.  09/12/17   Hosie Poisson, MD  sodium chloride 1 g tablet Take 1 tablet (1 g total) by mouth 2 (two) times daily with a meal. 11/19/17   Marene Lenz, MD    Family History Family  History  Problem Relation Age of Onset  . COPD Mother   . Alcohol abuse Father     Social History Social History   Tobacco Use  . Smoking status: Former Smoker    Last attempt to quit: 11/19/1968    Years since quitting: 49.1  . Smokeless tobacco: Never Used  . Tobacco comment: never used tobacco  Substance Use Topics  . Alcohol use: Yes    Alcohol/week: 4.2 oz    Types: 7 Standard drinks or equivalent per week    Comment: Wine/Beer  . Drug use: No     Allergies   Patient has no known allergies.   Review of Systems Review of Systems  Unable to perform ROS: Dementia     Physical Exam Updated Vital Signs BP 138/63   Pulse 72   Resp 15   SpO2 97%  Physical Exam  Constitutional: He appears well-developed.  HENT:  Head: Atraumatic.  Neck: Neck supple.  Cardiovascular: Normal rate.  Pulmonary/Chest: Effort normal.  Abdominal: Soft.  No melena, guaiac negative stools  Neurological: He is alert.  Skin: Skin is warm.  Nursing note and vitals reviewed.    ED Treatments / Results  Labs (all labs ordered are listed, but only abnormal results are displayed) Labs Reviewed  HEPATIC FUNCTION PANEL - Abnormal; Notable for the following components:      Result Value   Total Protein 5.0 (*)    Albumin 2.5 (*)    ALT 10 (*)    All other components within normal limits  CBC - Abnormal; Notable for the following components:   RBC 2.10 (*)    Hemoglobin 6.3 (*)    HCT 19.0 (*)    RDW 22.2 (*)    Platelets 440 (*)    All other components within normal limits  I-STAT CHEM 8, ED - Abnormal; Notable for the following components:   BUN 32 (*)    Glucose, Bld 109 (*)    Hemoglobin 5.4 (*)    HCT 16.0 (*)    All other components within normal limits  PROTIME-INR  APTT  VITAMIN B12  FOLATE  IRON AND TIBC  FERRITIN  RETICULOCYTES  POC OCCULT BLOOD, ED  POC OCCULT BLOOD, ED  TYPE AND SCREEN  PREPARE RBC (CROSSMATCH)    EKG  EKG  Interpretation  Date/Time:  Saturday December 21 2017 23:41:47 EST Ventricular Rate:  67 PR Interval:    QRS Duration: 103 QT Interval:  421 QTC Calculation: 445 R Axis:   27 Text Interpretation:  Sinus rhythm No acute changes No significant change since last tracing Confirmed by Varney Biles ([id number]) on 12/22/2017 12:31:28 AM       Radiology No results found.  Procedures Procedures (including critical care time)  CRITICAL CARE Performed by: Rollo Farquhar   Total critical care time: 32 minutes  Critical care time was exclusive of separately billable procedures and treating other patients.  Critical care was necessary to treat or prevent imminent or life-threatening deterioration.  Critical care was time spent personally by me on the following activities: development of treatment plan with patient and/or surrogate as well as nursing, discussions with consultants, evaluation of patient's response to treatment, examination of patient, obtaining history from patient or surrogate, ordering and performing treatments and interventions, ordering and review of laboratory studies, ordering and review of radiographic studies, pulse oximetry and re-evaluation of patient's condition.    Angiocath insertion Performed by: Demareon Coldwell  Consent: Verbal consent obtained. Risks and benefits: risks, benefits and alternatives were discussed Time out: Immediately prior to procedure a "time out" was called to verify the correct patient, procedure, equipment, support staff and site/side marked as required.  Preparation: Patient was prepped and draped in the usual sterile fashion.  Vein Location: left basillic vein  Ultrasound Guided  Gauge: 18 g  Normal blood return and flush without difficulty Patient tolerance: Patient tolerated the procedure well with no immediate complications.     Angiocath insertion Performed by: Krystopher Kuenzel  Consent: Verbal consent obtained. Risks and  benefits: risks, benefits and alternatives were discussed Time out: Immediately prior to procedure a "time out" was called to verify the correct patient, procedure, equipment, support staff and site/side marked as required.  Preparation: Patient was prepped and draped in the usual sterile fashion.  Vein Location: left antecubital fossa  Ultrasound Guided  Gauge: 20  g  Normal blood return and flush without difficulty Patient tolerance: Patient tolerated the procedure well with no immediate complications.     Medications Ordered in ED Medications  pantoprazole (PROTONIX) injection 40 mg (not administered)  0.9 %  sodium chloride infusion (not administered)     Initial Impression / Assessment and Plan / ED Course  I have reviewed the triage vital signs and the nursing notes.  Pertinent labs & imaging results that were available during my care of the patient were reviewed by me and considered in my medical decision making (see chart for details).     Patient with history of myelodysplastic syndrome, and other metabolic conditions comes in with chief complaint of low hemoglobin. Patient is guaiac negative in the ER.  He is noted to have thrombocytosis along with the anemia.  I suspect that his anemia is due to MDS.  Anemia panel has been sent.  Patient will be admitted.  We will give patient 2 units of PRBCs.  Our team has called the legal guardian.  Patient is full code and consent for blood transfusion was given.  Final Clinical Impressions(s) / ED Diagnoses   Final diagnoses:  Symptomatic anemia    ED Discharge Orders    None       Varney Biles, MD 12/22/17 [phone]

## 2017-12-22 NOTE — ED Notes (Signed)
Dr Kathrynn Humble given a copy of chem 8 results

## 2017-12-22 NOTE — ED Notes (Addendum)
Dr. Georgann Housekeeper is aware of pt's Hgb of 6.3.

## 2017-12-22 NOTE — Progress Notes (Signed)
Patient seen and examined this morning, admitted overnight by Dr. Tamala Julian, H&P reviewed and agree with the assessment and plan  Pleasant 84 year old male with underlying dementia, MDS, hypertension, hyperlipidemia, coronary artery disease was found to have low hemoglobin at the skilled nursing facility and was brought to the hospital.  Of note, his fecal occult was negative on admission.   Refractory anemia likely due to MDS -Patient recently hospitalized for right hip replacement, required transfusion during that hospital stay -No evidence of bleeding, fecal occult negative, probably related to MDS -Transfused 2 units of packed red blood cells, repeat hemoglobin is pending -Continue to monitor, may be able to go back to SNF tomorrow if hemoglobin is stable  Parkinson's disease, dementia with behavioral disturbances -Continue home medications  Hypertension -Blood pressure medications were held on admission, resume when improved  Recent right hip replacement -Needs to be discharged back to rehab once stable  Coronary artery disease -No chest pain, continue aspirin  History of hyponatremia -Sodium now normal, on salt tablets, continue   Costin M. Cruzita Lederer, MD [facility] [phone]  If 7PM-7AM, please contact night-coverage [email or web address] Password [email or web address]

## 2017-12-22 NOTE — Progress Notes (Signed)
CRITICAL VALUE ALERT  Critical Value:  Hgb 6.9  Date & Time Notied:  12/22/17 1130  Provider Notified: Dr. Cruzita Lederer  Orders Received/Actions taken: yes

## 2017-12-23 DIAGNOSIS — D469 Myelodysplastic syndrome, unspecified: Secondary | ICD-10-CM

## 2017-12-23 DIAGNOSIS — G20 Parkinson's disease: Secondary | ICD-10-CM

## 2017-12-23 DIAGNOSIS — I2510 Atherosclerotic heart disease of native coronary artery without angina pectoris: Secondary | ICD-10-CM

## 2017-12-23 DIAGNOSIS — I10 Essential (primary) hypertension: Secondary | ICD-10-CM

## 2017-12-23 DIAGNOSIS — F0281 Dementia in other diseases classified elsewhere with behavioral disturbance: Secondary | ICD-10-CM

## 2017-12-23 LAB — CBC
HEMATOCRIT: 29 % — AB (ref 39.0–52.0)
HEMOGLOBIN: 9.8 g/dL — AB (ref 13.0–17.0)
MCH: 30 pg (ref 26.0–34.0)
MCHC: 33.8 g/dL (ref 30.0–36.0)
MCV: 88.7 fL (ref 78.0–100.0)
Platelets: 418 10*3/uL — ABNORMAL HIGH (ref 150–400)
RBC: 3.27 MIL/uL — ABNORMAL LOW (ref 4.22–5.81)
RDW: 18.5 % — AB (ref 11.5–15.5)
WBC: 5.3 10*3/uL (ref 4.0–10.5)

## 2017-12-23 MED ORDER — HYDROCODONE-ACETAMINOPHEN 5-325 MG PO TABS: 1.0000 | ORAL_TABLET | Freq: Four times a day (QID) | ORAL | 0 refills | Status: AC | PRN

## 2017-12-23 NOTE — Progress Notes (Signed)
Patient discharged to [facility], report called to nurse Janett Billow. Transported by [facility] discharge paperwork sent with staff. Family aware of discharge plan.   Maui Britten, Tivis Ringer, RN

## 2017-12-23 NOTE — Progress Notes (Addendum)
Clinical Social Worker facilitated patient discharge including contacting patient family and facility to confirm patient discharge plans.  Clinical information faxed to facility and family agreeable with plan.  CSW arranged ambulance transport via [facility] to [facility] for a 3pm pick up at [place] to call [phone] (pt will go back to room 114)  for report prior to discharge.  Clinical Social Worker will sign off for now as social work intervention is no longer needed. Please consult [place] again if new need arises.  Rhea Pink, MSW, [place]

## 2017-12-23 NOTE — Clinical Social Work Note (Signed)
Clinical Social Work Assessment  Patient Details  Name: Alexander Rice MRN: [id number] Date of Birth: [birth year]  Date of referral:  12/23/17               Reason for consult:  Discharge Planning, Facility Placement                Permission sought to share information with:  Family Supports Permission granted to share information::  Yes, Verbal Permission Granted  Name::     Danel Requena  Agency::  [place] care  Relationship::  daughter  Contact Information:  [phone]  Housing/Transportation Living arrangements for the past 2 months:  [place] of Information:  Adult Children Patient Interpreter Needed:  None Criminal Activity/Legal Involvement Pertinent to Current Situation/Hospitalization:  No - Comment as needed Significant Relationships:  Spouse, Adult Children Lives with:  Spouse Do you feel safe going back to the place where you live?  Yes Need for family participation in patient care:  Yes (Comment)  Care giving concerns:  No family at bedside  Social Worker assessment / plan:  CSW unable to assess patient as he is only orient to sleep and was asleep in the room. CSW spoke to daughter Curt Bears via phone. Curt Bears stated she is agreeable for patient to return back to [facility] since she has already paid for a month in advance of his care at facility. CSW has reached out to facility and spoke to [place]. [facility] Glad stated they are able to take patient back.  Employment status:  Retired Forensic scientist:  [employer] PT Recommendations:  [place] / Referral to community resources:  [place]  Patient/Family's Response to care:  Daughter is very supportive of patient and his needs  Patient/Family's Understanding of and Emotional Response to Diagnosis, Current Treatment, and Prognosis:  Family agreeable for patient to discharge back to facility.  Emotional  Assessment Appearance:  Appears stated age Attitude/Demeanor/Rapport:  Unable to Assess Affect (typically observed):  Unable to Assess Orientation:  Oriented to Self Alcohol / Substance use:  Not Applicable Psych involvement (Current and /or in the community):  No (Comment)  Discharge Needs  Concerns to be addressed:  No discharge needs identified Readmission within the last 30 days:  No Current discharge risk:  None Barriers to Discharge:  No Barriers Identified   Wende Neighbors, LCSW 12/23/2017, 11:29 AM

## 2017-12-23 NOTE — Discharge Summary (Signed)
Physician Discharge Summary  Alexander Rice XHB:[id number] DOB: 1932-09-24 DOA: 12/21/2017  PCP: Marin Olp, MD  Admit date: 12/21/2017 Discharge date: 12/23/2017  Admitted From: SNF Disposition:  SNF  Recommendations for Outpatient Follow-up:  1. Follow up with Dr. Marin Olp in 1-2 weeks 2. Periodic CBC monitoring as outpatient   Home Health: none Equipment/Devices: none  Discharge Condition: stable CODE STATUS: Full code Diet recommendation: regular  HPI: Per Dr. Iran Planas Alexander Rice is a 84 y.o. male with medical history significant of  MDS, HTN, HLD, followed by Dr. Marin Olp, anemia, CAD, CVA, Parkinson's dz, dementia, hyponatremia, diverticulosis; who presents after being found to have low hemoglobin at skilled nursing facility. History is obtained from review of records as patient has significant dementia and unable to provide his own. He was just recently admitted to the hospital from 11/12/2017- 11/19/2017 after a fall sustaining a closed displaced right femoral neck fracture status post surgical correction. He had required a blood transfusion during that hospitalization. ED Course: Upon admission into the emergency department patient was noted to have a pulse of 66-83, respirations 15-22, blood pressure 107/67-144/62, and O2 saturation maintained on room air.  Labs revealed hemoglobin 6.3, platelets 448, BUN 32, and creatinine 0.9.  Anemia panel was pending.  Patient was typed and screened for blood products, given 40 mg of Protonix IV, and ordered to be transfused 2 units of PRBCs.  TRH called to admit  Hospital Course: Refractory anemia likely due to MDS -Patient recently hospitalized for right hip replacement, required transfusion during that hospital stay, probably in the setting of acute blood loss anemia due to hip fracture repair.  Currently patient has no evidence of bleeding, fecal occult negative his anemia is likely due to MDS.  He received a total of 3 units  packed red blood cells, hemoglobin improved appropriately and has remained stable.  Discussed with Dr. Marin Olp over the phone, he will follow-up with patient in office. Parkinson's disease, dementia with behavioral disturbances -Continue home medications Hypertension -continue home medications Recent right hip replacement -resume SNF Coronary artery disease -No chest pain, continue aspirin History of hyponatremia -Sodium now normal, on salt tablets, continue    Discharge Diagnoses:  Principal Problem:   Acute blood loss anemia Active Problems:   Essential hypertension   CAD (coronary artery disease)   MDS (myelodysplastic syndrome) (HCC)   Dementia due to Parkinson's disease with behavioral disturbance ([place])     Discharge Instructions   Allergies as of 12/23/2017   No Known Allergies     Medication List    TAKE these medications   acetaminophen 325 MG tablet Commonly known as:  TYLENOL Take 650 mg by mouth every 4 (four) hours as needed for mild pain or fever.   amLODipine 5 MG tablet Commonly known as:  NORVASC Take 1 tablet (5 mg total) by mouth daily.   aspirin 81 MG tablet Take 1 tablet (81 mg total) by mouth 2 (two) times daily.   carbidopa-levodopa 25-100 MG tablet Commonly known as:  SINEMET IR Take 1/2 tablet three times a day What changed:    how much to take  how to take this  when to take this  additional instructions   [employer] OMEGA-3 KRILL OIL PO Take 350 mg by mouth daily. Reported on 01/16/2016   [employer] PROBIOTIC Chew Chew 1 tablet by mouth daily.   cyanocobalamin 100 MCG tablet Take 100 mcg by mouth daily.   docusate sodium 100 MG capsule Commonly known as:  COLACE Take 1 capsule (100 mg total) by mouth 2 (two) times daily.   dorzolamide 2 % ophthalmic solution Commonly known as:  TRUSOPT Place 1 drop into both eyes 2 (two) times daily.   doxazosin 4 MG tablet Commonly known as:  CARDURA TAKE 1 TABLET BY MOUTH AT  BEDTIME   epoetin  alfa 40000 UNIT/ML injection Commonly known as:  PROCRIT Inject 1 mL (40,000 Units total) into the skin every 21 ( twenty-one) days. As needed for Hgb < 10g/dl   HYDROcodone-acetaminophen 5-325 MG tablet Commonly known as:  NORCO/VICODIN Take 1 tablet by mouth every 6 (six) hours as needed for moderate pain.   latanoprost 0.005 % ophthalmic solution Commonly known as:  XALATAN Place 1 drop into both eyes at bedtime.   lisinopril 5 MG tablet Commonly known as:  PRINIVIL,ZESTRIL TAKE 1 TABLET BY MOUTH  DAILY   memantine 10 MG tablet Commonly known as:  NAMENDA TAKE 1 TABLET BY MOUTH TWO  TIMES DAILY   multivitamin tablet Take 1 tablet by mouth daily.   omeprazole 20 MG capsule Commonly known as:  PRILOSEC TAKE 1 CAPSULE BY MOUTH  DAILY   simvastatin 40 MG tablet Commonly known as:  ZOCOR Hold for 2 to 4 weeks and recheck liver function tests at that time and if stable, can be restarted as per discretion of the PCP.   sodium chloride 1 g tablet Take 1 tablet (1 g total) by mouth 2 (two) times daily with a meal.   vitamin B-6 250 MG tablet Take 1 tablet (250 mg total) by mouth daily.        Consultations:  None   Procedures/Studies:  None    No results found.   Subjective: - no chest pain, shortness of breath, no abdominal pain, nausea or vomiting.    Discharge Exam: Vitals:   12/22/17 2142 12/23/17 0535  BP: (!) 165/55 (!) 172/64  Pulse: 64 69  Resp: 16 16  Temp: (!) 97.4 F (36.3 C) 97.8 F (36.6 C)  SpO2: 98% 98%    General: Pt is alert, awake, not in acute distress Cardiovascular: RRR, S1/S2 +, no rubs, no gallops Respiratory: CTA bilaterally, no wheezing, no rhonchi Abdominal: Soft, NT, ND, bowel sounds + Extremities: no edema, no cyanosis    The results of significant diagnostics from this hospitalization (including imaging, microbiology, ancillary and laboratory) are listed below for reference.     Microbiology: Recent Results (from  the past 240 hour(s))  MRSA PCR Screening     Status: None   Collection Time: 12/22/17  3:06 AM  Result Value Ref Range Status   MRSA by PCR NEGATIVE NEGATIVE Final    Comment:        The GeneXpert MRSA Assay (FDA approved for NASAL specimens only), is one component of a comprehensive MRSA colonization surveillance program. It is not intended to diagnose MRSA infection nor to guide or monitor treatment for MRSA infections. Performed at [facility], [place] [address] [place], [place] [address]      Labs: BNP (last 3 results) No results for input(s): BNP in the last 8760 hours. Basic Metabolic Panel: Recent Labs  Lab 12/22/17 0033 12/22/17 0554  NA 136 137  K 4.6 4.6  CL 105 109  CO2  --  23  GLUCOSE 109* 102*  BUN 32* 29*  CREATININE 0.90 0.87  CALCIUM  --  8.2*   Liver Function Tests: Recent Labs  Lab 12/21/17 2339  AST 34  ALT 10*  ALKPHOS 63  BILITOT 0.7  PROT 5.0*  ALBUMIN 2.5*   No results for input(s): LIPASE, AMYLASE in the last 168 hours. No results for input(s): AMMONIA in the last 168 hours. CBC: Recent Labs  Lab 12/21/17 2339 12/22/17 0033 12/22/17 1000 12/22/17 1914 12/23/17 0604  WBC 5.4  --  4.7 5.7 5.3  HGB 6.3* 5.4* 6.9* 9.2* 9.8*  HCT 19.0* 16.0* 21.1* 26.3* 29.0*  MCV 90.5  --  91.3 88.6 88.7  PLT 440*  --  409* 374 418*   Cardiac Enzymes: No results for input(s): CKTOTAL, CKMB, CKMBINDEX, TROPONINI in the last 168 hours. BNP: Invalid input(s): POCBNP CBG: No results for input(s): GLUCAP in the last 168 hours. D-Dimer No results for input(s): DDIMER in the last 72 hours. Hgb A1c No results for input(s): HGBA1C in the last 72 hours. Lipid Profile No results for input(s): CHOL, HDL, LDLCALC, TRIG, CHOLHDL, LDLDIRECT in the last 72 hours. Thyroid function studies No results for input(s): TSH, T4TOTAL, T3FREE, THYROIDAB in the last 72 hours.  Invalid input(s): FREET3 Anemia work up Recent Labs     12/22/17 0554  VITAMINB12 1,326*  FOLATE 10.4  FERRITIN 2,550*  TIBC 104*  IRON 106   Urinalysis    Component Value Date/Time   COLORURINE YELLOW 11/13/2017 0240   APPEARANCEUR CLEAR 11/13/2017 0240   LABSPEC 1.015 11/13/2017 0240   PHURINE 6.0 11/13/2017 0240   GLUCOSEU NEGATIVE 11/13/2017 0240   HGBUR NEGATIVE 11/13/2017 0240   HGBUR negative 10/11/2009 0819   BILIRUBINUR NEGATIVE 11/13/2017 0240   BILIRUBINUR n 04/18/2016 1152   KETONESUR 5 (A) 11/13/2017 0240   PROTEINUR NEGATIVE 11/13/2017 0240   UROBILINOGEN 1.0 04/18/2016 1152   UROBILINOGEN 0.2 10/11/2009 0819   NITRITE NEGATIVE 11/13/2017 0240   LEUKOCYTESUR NEGATIVE 11/13/2017 0240   Sepsis Labs Invalid input(s): PROCALCITONIN,  WBC,  LACTICIDVEN   Time coordinating discharge: 37 minutes  SIGNED:  Marzetta Board, MD  [facility] 12/23/2017, 11:06 AM Pager [phone]  If 7PM-7AM, please contact night-coverage [email or web address] Password [email or web address]

## 2017-12-23 NOTE — NC FL2 (Signed)
[place] LEVEL OF CARE SCREENING TOOL     IDENTIFICATION  Patient Name: Alexander Rice Birthdate: Aug 15, 1932 Sex: male Admission Date (Current Location): 12/21/2017  [facility] and [place] Number:  Herbalist and Address:  [facility], [place] [address] [place], [place] [address]      Provider Number: [id number]  Attending Physician Name and Address:  Caren Griffins, MD  Relative Name and Phone Number:       Current Level of Care: Hospital Recommended Level of Care: [place] Prior Approval Number:    Date Approved/Denied:   PASRR Number:    Discharge Plan: SNF    Current Diagnoses: Patient Active Problem List   Diagnosis Date Noted  . Acute blood loss anemia 12/22/2017  . Dementia due to Parkinson's disease with behavioral disturbance ([facility]) 12/22/2017  . Pressure injury of skin 11/16/2017  . Dementia without behavioral disturbance 11/15/2017  . Closed displaced fracture of right femoral neck ([place]) 11/12/2017  . Choleduodenal fistula s/p cholecystectomy/duodenal resection 09/07/2017 09/07/2017  . Choledocholithiasis   . Fall 09/04/2017  . AAA (abdominal aortic aneurysm) ([place]) 09/04/2017  . Bradycardia 04/27/2016  . Unresponsiveness 04/27/2016  . Severe dementia 03/31/2015  . Spinal stenosis of lumbar region 03/31/2015  . Hyponatremia 03/22/2015  . Pernicious anemia 03/11/2015  . Glaucoma 12/01/2014  . Pulmonary nodule 04/08/2012  . MDS (myelodysplastic syndrome) ([place]) 05/14/2011  . ANEMIA, B12 DEFICIENCY 07/03/2007  . Hyperlipidemia 09/29/2006  . Essential hypertension 09/29/2006  . CAD (coronary artery disease) 09/29/2006  . GERD 09/29/2006  . BPH (benign prostatic hyperplasia) 09/29/2006    Orientation RESPIRATION BLADDER Height & Weight     Self  Normal External catheter, Incontinent Weight:   Height:     BEHAVIORAL SYMPTOMS/MOOD NEUROLOGICAL BOWEL NUTRITION STATUS      Continent  Diet(check dc summary)  AMBULATORY STATUS COMMUNICATION OF NEEDS Skin   Limited Assist Verbally                         Personal Care Assistance Level of Assistance  Bathing, Dressing, Feeding Bathing Assistance: Limited assistance Feeding assistance: Limited assistance Dressing Assistance: Limited assistance     Functional Limitations Info  Sight, Hearing, Speech Sight Info: Adequate Hearing Info: Adequate Speech Info: Adequate    SPECIAL CARE FACTORS FREQUENCY  PT (By licensed PT), OT (By licensed OT)     PT Frequency: 5x wk OT Frequency: 5x wk            Contractures Contractures Info: Not present    Additional Factors Info  Allergies, Code Status Code Status Info: Full Code Allergies Info: NKA           Current Medications (12/23/2017):  This is the current hospital active medication list Current Facility-Administered Medications  Medication Dose Route Frequency Provider Last Rate Last Dose  . 0.9 %  sodium chloride infusion   Intravenous Continuous Fuller Plan A, MD 75 mL/hr at 12/22/17 0305    . acetaminophen (TYLENOL) tablet 650 mg  650 mg Oral Q6H PRN Norval Morton, MD       Or  . acetaminophen (TYLENOL) suppository 650 mg  650 mg Rectal Q6H PRN Smith, Rondell A, MD      . albuterol (PROVENTIL) (2.5 MG/3ML) 0.083% nebulizer solution 2.5 mg  2.5 mg Nebulization Q6H PRN Smith, Rondell A, MD      . aspirin EC tablet 81 mg  81 mg Oral  BID Fuller Plan A, MD   81 mg at 12/23/17 1028  . carbidopa-levodopa (SINEMET IR) 25-100 MG per tablet immediate release 0.5 tablet  0.5 tablet Oral TID WC Smith, Rondell A, MD   0.5 tablet at 12/23/17 1027  . docusate sodium (COLACE) capsule 100 mg  100 mg Oral BID Tamala Julian, Rondell A, MD   100 mg at 12/23/17 1028  . dorzolamide (TRUSOPT) 2 % ophthalmic solution 1 drop  1 drop Both Eyes BID Fuller Plan A, MD   1 drop at 12/23/17 1028  . HYDROcodone-acetaminophen (NORCO/VICODIN) 5-325 MG per tablet 1 tablet  1 tablet  Oral Q6H PRN Smith, Rondell A, MD      . latanoprost (XALATAN) 0.005 % ophthalmic solution 1 drop  1 drop Both Eyes QHS Tamala Julian, Rondell A, MD   1 drop at 12/22/17 2116  . memantine (NAMENDA) tablet 10 mg  10 mg Oral BID Fuller Plan A, MD   10 mg at 12/23/17 1027  . ondansetron (ZOFRAN) tablet 4 mg  4 mg Oral Q6H PRN Fuller Plan A, MD       Or  . ondansetron (ZOFRAN) injection 4 mg  4 mg Intravenous Q6H PRN Smith, Rondell A, MD      . pantoprazole (PROTONIX) injection 40 mg  40 mg Intravenous BID Tamala Julian, Rondell A, MD   40 mg at 12/23/17 1028  . simvastatin (ZOCOR) tablet 40 mg  40 mg Oral QHS Fuller Plan A, MD   40 mg at 12/22/17 2116  . sodium chloride tablet 1 g  1 g Oral BID WC Smith, Rondell A, MD   1 g at 12/23/17 1026  . vitamin B-12 (CYANOCOBALAMIN) tablet 100 mcg  100 mcg Oral Daily Fuller Plan A, MD   100 mcg at 12/23/17 1027     Discharge Medications: Please see discharge summary for a list of discharge medications.  Relevant Imaging Results:  Relevant Lab Results:   Additional Information SS# [id number]  Wende Neighbors, LCSW

## 2017-12-23 NOTE — Clinical Social Work Placement (Signed)
   CLINICAL SOCIAL WORK PLACEMENT  NOTE  Date:  12/23/2017  Patient Details  Name: Alexander Rice MRN: [id number] Date of Birth: Dec 25, 1931  Clinical Social Work is seeking post-discharge placement for this patient at the [place] level of care (*CSW will initial, date and re-position this form in  chart as items are completed):  Yes   Patient/family provided with [employer] Work Department's list of facilities offering this level of care within the geographic area requested by the patient (or if unable, by the patient's family).  Yes   Patient/family informed of their freedom to choose among providers that offer the needed level of care, that participate in Medicare, Medicaid or managed care program needed by the patient, have an available bed and are willing to accept the patient.  Yes   Patient/family informed of 's ownership interest in [facility] and [facility], as well as of the fact that they are under no obligation to receive care at these facilities.  PASRR submitted to EDS on       PASRR number received on       Existing PASRR number confirmed on 12/23/17     FL2 transmitted to all facilities in geographic area requested by pt/family on 12/23/17     FL2 transmitted to all facilities within larger geographic area on       Patient informed that his/her managed care company has contracts with or will negotiate with certain facilities, including the following:            Patient/family informed of bed offers received.  Patient chooses bed at [facility]     Physician recommends and patient chooses bed at      Patient to be transferred to [facility] on 12/23/17.  Patient to be transferred to facility by [facility]     Patient family notified on 12/23/17 of transfer.  Name of family member notified:  Tidus Upchurch pt daughter     PHYSICIAN       Additional Comment:     _______________________________________________ Wende Neighbors, LCSW 12/23/2017, 12:38 PM

## 2017-12-23 NOTE — Care Management Note (Signed)
Case Management Note  Patient Details  Name: Alexander Rice MRN: [id number] Date of Birth: 1932-02-13  Subjective/Objective:         Acute blood loss, anemia.           Action/Plan: Transition to SNF/ [facility]. CSW to manage disposition to facility.  Expected Discharge Date:  12/23/17               Expected Discharge Plan:  [place]  In-House Referral:  Clinical Social Work  Discharge planning Services  CM Consult   Status of Service:  Completed, signed off  If discussed at [employer] of Stay Meetings, dates discussed:    Additional Comments:  Sharin Mons, RN 12/23/2017, 12:23 PM

## 2017-12-24 ENCOUNTER — Telehealth: Payer: Self-pay | Admitting: *Deleted

## 2017-12-24 DIAGNOSIS — G20 Parkinson's disease: Secondary | ICD-10-CM | POA: Diagnosis not present

## 2017-12-24 DIAGNOSIS — I10 Essential (primary) hypertension: Secondary | ICD-10-CM | POA: Diagnosis not present

## 2017-12-24 DIAGNOSIS — D638 Anemia in other chronic diseases classified elsewhere: Secondary | ICD-10-CM | POA: Diagnosis not present

## 2017-12-24 DIAGNOSIS — D469 Myelodysplastic syndrome, unspecified: Secondary | ICD-10-CM | POA: Diagnosis not present

## 2017-12-24 DIAGNOSIS — F0390 Unspecified dementia without behavioral disturbance: Secondary | ICD-10-CM | POA: Diagnosis not present

## 2017-12-24 LAB — TYPE AND SCREEN
ABO/RH(D): A POS
ANTIBODY SCREEN: NEGATIVE
UNIT DIVISION: 0
UNIT DIVISION: 0
UNIT DIVISION: 0
UNIT DIVISION: 0
Unit division: 0

## 2017-12-24 LAB — BPAM RBC
BLOOD PRODUCT EXPIRATION DATE: 201902152359
BLOOD PRODUCT EXPIRATION DATE: 201902152359
Blood Product Expiration Date: 201902162359
Blood Product Expiration Date: 201902212359
Blood Product Expiration Date: 201902212359
ISSUE DATE / TIME: 201902030554
ISSUE DATE / TIME: 201902031156
ISSUE DATE / TIME: 201902030316
UNIT TYPE AND RH: 6200
UNIT TYPE AND RH: 6200
Unit Type and Rh: 6200
Unit Type and Rh: 6200
Unit Type and Rh: 6200

## 2017-12-24 NOTE — Telephone Encounter (Signed)
Attempted to call patient to complete TCM call, left voicemail requesting call back.

## 2017-12-25 ENCOUNTER — Other Ambulatory Visit: Payer: Self-pay | Admitting: *Deleted

## 2017-12-25 NOTE — Telephone Encounter (Signed)
I called and spoke to patients daughter Curt Bears. She states that her dad is currently in a SNF. She is wanting to bring him home before the end of the month. She states that they will need a new referral for [facility] for PT/OT/ and wound care. She will also need a prescription for a semi electric hospital bed and flexible back wheelchair faxed to [facility]. Their phone# [phone]. She states that she cannot bring her dad home until they have this equipment and home health in place.

## 2017-12-25 NOTE — Telephone Encounter (Signed)
Im not opposed to sending in orders but could not sign face to face as last visit was in November so we would have to have a visit for that. Could the physician at SNF order these things.   [name]- I think care connection [facility] referral would be reasonable- he is high risk for hospitalization

## 2017-12-25 NOTE — Telephone Encounter (Addendum)
I called [facility] and they state that they can get all home health orders from the nursing facility that the patient is currently residing in and then once discharged they will follow up with Dr Yong Channel for any additional orders. They state that once they get the orders from the nursing facility and the patient gets discharged they will send any follow up orders to Dr Yong Channel. I called patients daughter Curt Bears back and left a voicemail with this message.

## 2017-12-25 NOTE — Telephone Encounter (Signed)
Alexander Rice calling back, read message to her from [place] but she is checking up also on the rx for a wheelchair and hospital bed. She states her voicemail sometimes messed up and she does not receive the messages.

## 2017-12-25 NOTE — Patient Outreach (Signed)
[place] [facility]) Care Management  12/25/2017  NEKO BOYAJIAN 02/11/1932 [id number]   Met with [name] as Patient back on admission list to facility.  She confirms that patient is now LTC at facility, he did go out to the hospital for anemia but has returned. No plans to discharge home at this time.  Plan to sign off. [name]. [name], RN, BSN, [place] [phone]) Business Cell  [phone]) Toll Free Office

## 2017-12-25 NOTE — Telephone Encounter (Signed)
See note

## 2017-12-26 DIAGNOSIS — L89610 Pressure ulcer of right heel, unstageable: Secondary | ICD-10-CM | POA: Diagnosis not present

## 2017-12-26 NOTE — Telephone Encounter (Signed)
Spoke to [name] pt's daughter, told her [name] called [facility] and was told that they can get all home health orders from the nursing facility that the patient is currently residing in and then once discharged they will follow up with Dr Yong Channel for any additional orders. They state that once they get the orders from the nursing facility and the patient gets discharged they will send any follow up orders to Dr Yong Channel. Alexander Rice verbalized understanding. Told her to talk to Social worker at facility and they should be able to help with wheelchair and bed. Alexander Rice verbalized understanding.

## 2017-12-27 DIAGNOSIS — D649 Anemia, unspecified: Secondary | ICD-10-CM | POA: Diagnosis not present

## 2017-12-31 DIAGNOSIS — Z4889 Encounter for other specified surgical aftercare: Secondary | ICD-10-CM | POA: Diagnosis not present

## 2017-12-31 DIAGNOSIS — Z96641 Presence of right artificial hip joint: Secondary | ICD-10-CM | POA: Diagnosis not present

## 2018-01-01 DIAGNOSIS — D649 Anemia, unspecified: Secondary | ICD-10-CM | POA: Diagnosis not present

## 2018-01-02 DIAGNOSIS — L89610 Pressure ulcer of right heel, unstageable: Secondary | ICD-10-CM | POA: Diagnosis not present

## 2018-01-07 DIAGNOSIS — M6281 Muscle weakness (generalized): Secondary | ICD-10-CM | POA: Diagnosis not present

## 2018-01-07 DIAGNOSIS — R609 Edema, unspecified: Secondary | ICD-10-CM | POA: Diagnosis not present

## 2018-01-07 DIAGNOSIS — I10 Essential (primary) hypertension: Secondary | ICD-10-CM | POA: Diagnosis not present

## 2018-01-07 DIAGNOSIS — F0390 Unspecified dementia without behavioral disturbance: Secondary | ICD-10-CM | POA: Diagnosis not present

## 2018-01-07 DIAGNOSIS — M25351 Other instability, right hip: Secondary | ICD-10-CM | POA: Diagnosis not present

## 2018-01-07 DIAGNOSIS — R1312 Dysphagia, oropharyngeal phase: Secondary | ICD-10-CM | POA: Diagnosis not present

## 2018-01-07 DIAGNOSIS — S72001D Fracture of unspecified part of neck of right femur, subsequent encounter for closed fracture with routine healing: Secondary | ICD-10-CM | POA: Diagnosis not present

## 2018-01-07 DIAGNOSIS — R262 Difficulty in walking, not elsewhere classified: Secondary | ICD-10-CM | POA: Diagnosis not present

## 2018-01-07 DIAGNOSIS — G20 Parkinson's disease: Secondary | ICD-10-CM | POA: Diagnosis not present

## 2018-01-07 DIAGNOSIS — Z471 Aftercare following joint replacement surgery: Secondary | ICD-10-CM | POA: Diagnosis not present

## 2018-01-08 ENCOUNTER — Telehealth: Payer: Self-pay | Admitting: *Deleted

## 2018-01-08 ENCOUNTER — Telehealth: Payer: Self-pay | Admitting: Family Medicine

## 2018-01-08 DIAGNOSIS — S72001D Fracture of unspecified part of neck of right femur, subsequent encounter for closed fracture with routine healing: Secondary | ICD-10-CM | POA: Diagnosis not present

## 2018-01-08 DIAGNOSIS — G20 Parkinson's disease: Secondary | ICD-10-CM | POA: Diagnosis not present

## 2018-01-08 DIAGNOSIS — I2510 Atherosclerotic heart disease of native coronary artery without angina pectoris: Secondary | ICD-10-CM | POA: Diagnosis not present

## 2018-01-08 DIAGNOSIS — L89610 Pressure ulcer of right heel, unstageable: Secondary | ICD-10-CM | POA: Diagnosis not present

## 2018-01-08 DIAGNOSIS — F0281 Dementia in other diseases classified elsewhere with behavioral disturbance: Secondary | ICD-10-CM | POA: Diagnosis not present

## 2018-01-08 DIAGNOSIS — D469 Myelodysplastic syndrome, unspecified: Secondary | ICD-10-CM | POA: Diagnosis not present

## 2018-01-08 NOTE — Telephone Encounter (Signed)
Patient's daughter, Curt Bears, requesting that orders be sent to [facility] for lab draws every 2 weeks. Patient was discharged home from skilled nursing on 01/07/2018.   Orders for CBC and CMP q2 weeks faxed to [phone]

## 2018-01-08 NOTE — Telephone Encounter (Signed)
See note.   Copied from [place]. Topic: General - Other >> Jan 08, 2018 10:25 AM Synthia Innocent wrote: CRM for notification. Daughter requesting to speak with CMA, regarding hospital follow up, family is unable to bring patient, patient unable to walk. Able to skip appt?  01/08/18.

## 2018-01-09 DIAGNOSIS — L89610 Pressure ulcer of right heel, unstageable: Secondary | ICD-10-CM | POA: Diagnosis not present

## 2018-01-09 DIAGNOSIS — G20 Parkinson's disease: Secondary | ICD-10-CM | POA: Diagnosis not present

## 2018-01-09 DIAGNOSIS — S72001D Fracture of unspecified part of neck of right femur, subsequent encounter for closed fracture with routine healing: Secondary | ICD-10-CM | POA: Diagnosis not present

## 2018-01-09 DIAGNOSIS — D469 Myelodysplastic syndrome, unspecified: Secondary | ICD-10-CM | POA: Diagnosis not present

## 2018-01-09 DIAGNOSIS — F0281 Dementia in other diseases classified elsewhere with behavioral disturbance: Secondary | ICD-10-CM | POA: Diagnosis not present

## 2018-01-09 DIAGNOSIS — I2510 Atherosclerotic heart disease of native coronary artery without angina pectoris: Secondary | ICD-10-CM | POA: Diagnosis not present

## 2018-01-09 NOTE — Telephone Encounter (Signed)
Its ok if he cannot come in. Have we thought about palliative care visit? Cant remember if we discussed or if that's set up

## 2018-01-09 NOTE — Telephone Encounter (Signed)
Called and spoke with daughter who states they took her dad back to Ortho on the 12th. His new hip replacement has popped out of socket. Ortho doesn't think it is worth putting him through a procedure right now. He is not able to walk and has to have a [place] lift to transfer him and medical transport to travel. They do have home health involved. Ortho will reevaluate in a few months to see if home PT has helped any.   Curt Bears (the daughter) wants to see if it is ok that they do not come in for a follow up?

## 2018-01-14 ENCOUNTER — Telehealth: Payer: Self-pay | Admitting: Family Medicine

## 2018-01-14 DIAGNOSIS — I2510 Atherosclerotic heart disease of native coronary artery without angina pectoris: Secondary | ICD-10-CM | POA: Diagnosis not present

## 2018-01-14 DIAGNOSIS — F0281 Dementia in other diseases classified elsewhere with behavioral disturbance: Secondary | ICD-10-CM | POA: Diagnosis not present

## 2018-01-14 DIAGNOSIS — G20 Parkinson's disease: Secondary | ICD-10-CM | POA: Diagnosis not present

## 2018-01-14 DIAGNOSIS — D469 Myelodysplastic syndrome, unspecified: Secondary | ICD-10-CM | POA: Diagnosis not present

## 2018-01-14 DIAGNOSIS — L89610 Pressure ulcer of right heel, unstageable: Secondary | ICD-10-CM | POA: Diagnosis not present

## 2018-01-14 DIAGNOSIS — S72001D Fracture of unspecified part of neck of right femur, subsequent encounter for closed fracture with routine healing: Secondary | ICD-10-CM | POA: Diagnosis not present

## 2018-01-14 NOTE — Telephone Encounter (Signed)
Copied from [place]. Topic: Quick Communication - See Telephone Encounter >> Jan 14, 2018 12:46 PM Margot Ables wrote: Reason for CRM: requesting VO for OT 1x week 1, 2x week 2, 1x week 3 He has a confidential VM and ok to leave a detailed msg

## 2018-01-14 NOTE — Telephone Encounter (Signed)
Called and left a voicemail message requesting a return phone call 

## 2018-01-14 NOTE — Telephone Encounter (Signed)
See note

## 2018-01-14 NOTE — Telephone Encounter (Signed)
Called and left a voicemail message providing verbal orders for OT as requested. I did provide a call back number for questions

## 2018-01-14 NOTE — Telephone Encounter (Signed)
Copied from [place]. Topic: Quick Communication - See Telephone Encounter >> Jan 14, 2018  8:18 AM Ether Griffins B wrote: CRM for notification. See Telephone encounter for:  Alexander Rice with [facility] saw pt last week for PT eval and would like verbal orders to continue seeing the pt. 2x for 2 weeks then 1x for 2 weeks. Call back number [phone]  01/14/18.

## 2018-01-15 ENCOUNTER — Ambulatory Visit: Payer: Medicare Other | Admitting: Podiatry

## 2018-01-15 DIAGNOSIS — F0281 Dementia in other diseases classified elsewhere with behavioral disturbance: Secondary | ICD-10-CM | POA: Diagnosis not present

## 2018-01-15 DIAGNOSIS — G20 Parkinson's disease: Secondary | ICD-10-CM | POA: Diagnosis not present

## 2018-01-15 DIAGNOSIS — S72001D Fracture of unspecified part of neck of right femur, subsequent encounter for closed fracture with routine healing: Secondary | ICD-10-CM | POA: Diagnosis not present

## 2018-01-15 DIAGNOSIS — D469 Myelodysplastic syndrome, unspecified: Secondary | ICD-10-CM | POA: Diagnosis not present

## 2018-01-15 DIAGNOSIS — L89610 Pressure ulcer of right heel, unstageable: Secondary | ICD-10-CM | POA: Diagnosis not present

## 2018-01-15 DIAGNOSIS — I2510 Atherosclerotic heart disease of native coronary artery without angina pectoris: Secondary | ICD-10-CM | POA: Diagnosis not present

## 2018-01-15 NOTE — Telephone Encounter (Signed)
Called and spoke with daughter who states they had met with Hospice previously. She thought maybe a year ago. She said at the time that it was not a fit for them. She doesn't feel they are at a point to need it. They do have [facility] coming in and feels they are doing enough.

## 2018-01-15 NOTE — Telephone Encounter (Signed)
Called and confirmed with Alexander Rice that he had received verbal orders as requested

## 2018-01-16 DIAGNOSIS — G20 Parkinson's disease: Secondary | ICD-10-CM | POA: Diagnosis not present

## 2018-01-16 DIAGNOSIS — F0281 Dementia in other diseases classified elsewhere with behavioral disturbance: Secondary | ICD-10-CM | POA: Diagnosis not present

## 2018-01-16 DIAGNOSIS — I2510 Atherosclerotic heart disease of native coronary artery without angina pectoris: Secondary | ICD-10-CM | POA: Diagnosis not present

## 2018-01-16 DIAGNOSIS — S72001D Fracture of unspecified part of neck of right femur, subsequent encounter for closed fracture with routine healing: Secondary | ICD-10-CM | POA: Diagnosis not present

## 2018-01-16 DIAGNOSIS — L89610 Pressure ulcer of right heel, unstageable: Secondary | ICD-10-CM | POA: Diagnosis not present

## 2018-01-16 DIAGNOSIS — D469 Myelodysplastic syndrome, unspecified: Secondary | ICD-10-CM | POA: Diagnosis not present

## 2018-01-17 DIAGNOSIS — I2510 Atherosclerotic heart disease of native coronary artery without angina pectoris: Secondary | ICD-10-CM | POA: Diagnosis not present

## 2018-01-17 DIAGNOSIS — G20 Parkinson's disease: Secondary | ICD-10-CM | POA: Diagnosis not present

## 2018-01-17 DIAGNOSIS — L89610 Pressure ulcer of right heel, unstageable: Secondary | ICD-10-CM | POA: Diagnosis not present

## 2018-01-17 DIAGNOSIS — F0281 Dementia in other diseases classified elsewhere with behavioral disturbance: Secondary | ICD-10-CM | POA: Diagnosis not present

## 2018-01-17 DIAGNOSIS — S72001D Fracture of unspecified part of neck of right femur, subsequent encounter for closed fracture with routine healing: Secondary | ICD-10-CM | POA: Diagnosis not present

## 2018-01-17 DIAGNOSIS — D469 Myelodysplastic syndrome, unspecified: Secondary | ICD-10-CM | POA: Diagnosis not present

## 2018-01-20 ENCOUNTER — Telehealth: Payer: Self-pay | Admitting: *Deleted

## 2018-01-20 DIAGNOSIS — L89610 Pressure ulcer of right heel, unstageable: Secondary | ICD-10-CM | POA: Diagnosis not present

## 2018-01-20 DIAGNOSIS — G20 Parkinson's disease: Secondary | ICD-10-CM | POA: Diagnosis not present

## 2018-01-20 DIAGNOSIS — D469 Myelodysplastic syndrome, unspecified: Secondary | ICD-10-CM | POA: Diagnosis not present

## 2018-01-20 DIAGNOSIS — S72001D Fracture of unspecified part of neck of right femur, subsequent encounter for closed fracture with routine healing: Secondary | ICD-10-CM | POA: Diagnosis not present

## 2018-01-20 DIAGNOSIS — F0281 Dementia in other diseases classified elsewhere with behavioral disturbance: Secondary | ICD-10-CM | POA: Diagnosis not present

## 2018-01-20 DIAGNOSIS — I2510 Atherosclerotic heart disease of native coronary artery without angina pectoris: Secondary | ICD-10-CM | POA: Diagnosis not present

## 2018-01-20 NOTE — Telephone Encounter (Signed)
Received lab results from home health.  Dr Marin Olp notified and reviewed lab results. He would like to hold on any treatment and will review results next week.   Family aware of plan.

## 2018-01-21 ENCOUNTER — Encounter: Payer: Self-pay | Admitting: Hematology & Oncology

## 2018-01-21 DIAGNOSIS — G20 Parkinson's disease: Secondary | ICD-10-CM | POA: Diagnosis not present

## 2018-01-21 DIAGNOSIS — I2510 Atherosclerotic heart disease of native coronary artery without angina pectoris: Secondary | ICD-10-CM | POA: Diagnosis not present

## 2018-01-21 DIAGNOSIS — S72001D Fracture of unspecified part of neck of right femur, subsequent encounter for closed fracture with routine healing: Secondary | ICD-10-CM | POA: Diagnosis not present

## 2018-01-21 DIAGNOSIS — D469 Myelodysplastic syndrome, unspecified: Secondary | ICD-10-CM | POA: Diagnosis not present

## 2018-01-21 DIAGNOSIS — F0281 Dementia in other diseases classified elsewhere with behavioral disturbance: Secondary | ICD-10-CM | POA: Diagnosis not present

## 2018-01-21 DIAGNOSIS — L89610 Pressure ulcer of right heel, unstageable: Secondary | ICD-10-CM | POA: Diagnosis not present

## 2018-01-24 ENCOUNTER — Telehealth: Payer: Self-pay | Admitting: Family Medicine

## 2018-01-24 DIAGNOSIS — L89610 Pressure ulcer of right heel, unstageable: Secondary | ICD-10-CM | POA: Diagnosis not present

## 2018-01-24 DIAGNOSIS — D469 Myelodysplastic syndrome, unspecified: Secondary | ICD-10-CM | POA: Diagnosis not present

## 2018-01-24 DIAGNOSIS — G20 Parkinson's disease: Secondary | ICD-10-CM | POA: Diagnosis not present

## 2018-01-24 DIAGNOSIS — F0281 Dementia in other diseases classified elsewhere with behavioral disturbance: Secondary | ICD-10-CM | POA: Diagnosis not present

## 2018-01-24 DIAGNOSIS — S72001D Fracture of unspecified part of neck of right femur, subsequent encounter for closed fracture with routine healing: Secondary | ICD-10-CM | POA: Diagnosis not present

## 2018-01-24 DIAGNOSIS — I2510 Atherosclerotic heart disease of native coronary artery without angina pectoris: Secondary | ICD-10-CM | POA: Diagnosis not present

## 2018-01-24 NOTE — Telephone Encounter (Signed)
See note

## 2018-01-24 NOTE — Telephone Encounter (Signed)
Copied from [place] [phone]. Topic: Quick Communication - See Telephone Encounter >> Jan 24, 2018  2:37 PM Hewitt Shorts wrote: CRM for notification. See Telephone encounter for: Dr. Karie Georges from Hospice is concerning this patient and letting Dr. Yong Channel know that he is for right know not eligible for hopise since he is under the care of Dr. Marin Olp and he will take care of putting him into hospice when ready   01/24/18.

## 2018-01-25 ENCOUNTER — Other Ambulatory Visit: Payer: Self-pay

## 2018-01-25 ENCOUNTER — Encounter (HOSPITAL_COMMUNITY): Payer: Self-pay | Admitting: Internal Medicine

## 2018-01-25 ENCOUNTER — Inpatient Hospital Stay (HOSPITAL_COMMUNITY)
Admission: EM | Admit: 2018-01-25 | Discharge: 2018-01-29 | DRG: 812 | Disposition: A | Discharge status: Hospice | Payer: Medicare Other | Attending: Internal Medicine | Admitting: Internal Medicine

## 2018-01-25 DIAGNOSIS — S73006A Unspecified dislocation of unspecified hip, initial encounter: Secondary | ICD-10-CM | POA: Diagnosis present

## 2018-01-25 DIAGNOSIS — H409 Unspecified glaucoma: Secondary | ICD-10-CM | POA: Diagnosis not present

## 2018-01-25 DIAGNOSIS — F0280 Dementia in other diseases classified elsewhere without behavioral disturbance: Secondary | ICD-10-CM | POA: Diagnosis present

## 2018-01-25 DIAGNOSIS — D469 Myelodysplastic syndrome, unspecified: Secondary | ICD-10-CM | POA: Diagnosis present

## 2018-01-25 DIAGNOSIS — Z66 Do not resuscitate: Secondary | ICD-10-CM | POA: Diagnosis not present

## 2018-01-25 DIAGNOSIS — E46 Unspecified protein-calorie malnutrition: Secondary | ICD-10-CM | POA: Diagnosis present

## 2018-01-25 DIAGNOSIS — K219 Gastro-esophageal reflux disease without esophagitis: Secondary | ICD-10-CM | POA: Diagnosis not present

## 2018-01-25 DIAGNOSIS — E785 Hyperlipidemia, unspecified: Secondary | ICD-10-CM | POA: Diagnosis present

## 2018-01-25 DIAGNOSIS — L89152 Pressure ulcer of sacral region, stage 2: Secondary | ICD-10-CM | POA: Diagnosis present

## 2018-01-25 DIAGNOSIS — M1990 Unspecified osteoarthritis, unspecified site: Secondary | ICD-10-CM | POA: Diagnosis present

## 2018-01-25 DIAGNOSIS — Z681 Body mass index (BMI) 19 or less, adult: Secondary | ICD-10-CM | POA: Diagnosis not present

## 2018-01-25 DIAGNOSIS — D649 Anemia, unspecified: Secondary | ICD-10-CM

## 2018-01-25 DIAGNOSIS — N401 Enlarged prostate with lower urinary tract symptoms: Secondary | ICD-10-CM | POA: Diagnosis not present

## 2018-01-25 DIAGNOSIS — I10 Essential (primary) hypertension: Secondary | ICD-10-CM | POA: Diagnosis not present

## 2018-01-25 DIAGNOSIS — L8990 Pressure ulcer of unspecified site, unspecified stage: Secondary | ICD-10-CM | POA: Diagnosis present

## 2018-01-25 DIAGNOSIS — E538 Deficiency of other specified B group vitamins: Secondary | ICD-10-CM | POA: Diagnosis present

## 2018-01-25 DIAGNOSIS — L89621 Pressure ulcer of left heel, stage 1: Secondary | ICD-10-CM | POA: Diagnosis present

## 2018-01-25 DIAGNOSIS — L89611 Pressure ulcer of right heel, stage 1: Secondary | ICD-10-CM | POA: Diagnosis present

## 2018-01-25 DIAGNOSIS — R4182 Altered mental status, unspecified: Secondary | ICD-10-CM | POA: Diagnosis not present

## 2018-01-25 DIAGNOSIS — N400 Enlarged prostate without lower urinary tract symptoms: Secondary | ICD-10-CM | POA: Diagnosis not present

## 2018-01-25 DIAGNOSIS — R627 Adult failure to thrive: Secondary | ICD-10-CM | POA: Diagnosis present

## 2018-01-25 DIAGNOSIS — D62 Acute posthemorrhagic anemia: Secondary | ICD-10-CM | POA: Diagnosis present

## 2018-01-25 DIAGNOSIS — Z79899 Other long term (current) drug therapy: Secondary | ICD-10-CM | POA: Diagnosis not present

## 2018-01-25 DIAGNOSIS — R338 Other retention of urine: Secondary | ICD-10-CM | POA: Diagnosis not present

## 2018-01-25 DIAGNOSIS — R42 Dizziness and giddiness: Secondary | ICD-10-CM | POA: Diagnosis not present

## 2018-01-25 DIAGNOSIS — G20 Parkinson's disease: Secondary | ICD-10-CM | POA: Diagnosis not present

## 2018-01-25 DIAGNOSIS — R5383 Other fatigue: Secondary | ICD-10-CM | POA: Diagnosis not present

## 2018-01-25 DIAGNOSIS — G309 Alzheimer's disease, unspecified: Secondary | ICD-10-CM | POA: Diagnosis present

## 2018-01-25 DIAGNOSIS — Z86010 Personal history of colonic polyps: Secondary | ICD-10-CM

## 2018-01-25 DIAGNOSIS — Z7401 Bed confinement status: Secondary | ICD-10-CM | POA: Diagnosis not present

## 2018-01-25 DIAGNOSIS — X58XXXA Exposure to other specified factors, initial encounter: Secondary | ICD-10-CM | POA: Diagnosis present

## 2018-01-25 DIAGNOSIS — I2510 Atherosclerotic heart disease of native coronary artery without angina pectoris: Secondary | ICD-10-CM | POA: Diagnosis not present

## 2018-01-25 DIAGNOSIS — E871 Hypo-osmolality and hyponatremia: Secondary | ICD-10-CM

## 2018-01-25 DIAGNOSIS — D464 Refractory anemia, unspecified: Secondary | ICD-10-CM | POA: Diagnosis present

## 2018-01-25 DIAGNOSIS — Z515 Encounter for palliative care: Secondary | ICD-10-CM | POA: Diagnosis not present

## 2018-01-25 DIAGNOSIS — Z87891 Personal history of nicotine dependence: Secondary | ICD-10-CM

## 2018-01-25 DIAGNOSIS — F03C0 Unspecified dementia, severe, without behavioral disturbance, psychotic disturbance, mood disturbance, and anxiety: Secondary | ICD-10-CM | POA: Diagnosis present

## 2018-01-25 DIAGNOSIS — F0390 Unspecified dementia without behavioral disturbance: Secondary | ICD-10-CM | POA: Diagnosis not present

## 2018-01-25 DIAGNOSIS — Z7982 Long term (current) use of aspirin: Secondary | ICD-10-CM

## 2018-01-25 DIAGNOSIS — Z96641 Presence of right artificial hip joint: Secondary | ICD-10-CM | POA: Diagnosis present

## 2018-01-25 DIAGNOSIS — R404 Transient alteration of awareness: Secondary | ICD-10-CM | POA: Diagnosis not present

## 2018-01-25 LAB — COMPREHENSIVE METABOLIC PANEL
ALBUMIN: 3.1 g/dL — AB (ref 3.5–5.0)
ALT: 30 U/L (ref 17–63)
ANION GAP: 6 (ref 5–15)
AST: 37 U/L (ref 15–41)
Alkaline Phosphatase: 104 U/L (ref 38–126)
BUN: 11 mg/dL (ref 6–20)
CO2: 22 mmol/L (ref 22–32)
Calcium: 8 mg/dL — ABNORMAL LOW (ref 8.9–10.3)
Chloride: 91 mmol/L — ABNORMAL LOW (ref 101–111)
Creatinine, Ser: 0.57 mg/dL — ABNORMAL LOW (ref 0.61–1.24)
GFR calc Af Amer: >60 mL/min (ref >60)
GFR calc non Af Amer: >60 mL/min (ref >60)
GLUCOSE: 105 mg/dL — AB (ref 65–99)
POTASSIUM: 4.8 mmol/L (ref 3.5–5.1)
Sodium: 119 mmol/L — CL (ref 135–145)
Total Bilirubin: 0.9 mg/dL (ref 0.3–1.2)
Total Protein: 5.4 g/dL — ABNORMAL LOW (ref 6.5–8.1)

## 2018-01-25 LAB — CBC WITH DIFFERENTIAL/PLATELET
BASOS ABS: 0 10*3/uL (ref 0.0–0.1)
BASOS PCT: 0 %
EOS ABS: 0.1 10*3/uL (ref 0.0–0.7)
EOS PCT: 2 %
HCT: 18.8 % — ABNORMAL LOW (ref 39.0–52.0)
Hemoglobin: 6.6 g/dL — CL (ref 13.0–17.0)
LYMPHS PCT: 25 %
Lymphs Abs: 1.2 10*3/uL (ref 0.7–4.0)
MCH: 30.8 pg (ref 26.0–34.0)
MCHC: 35.1 g/dL (ref 30.0–36.0)
MCV: 87.9 fL (ref 78.0–100.0)
MONO ABS: 0.6 10*3/uL (ref 0.1–1.0)
Monocytes Relative: 12 %
Neutro Abs: 2.9 10*3/uL (ref 1.7–7.7)
Neutrophils Relative %: 61 %
PLATELETS: 335 10*3/uL (ref 150–400)
RBC: 2.14 MIL/uL — AB (ref 4.22–5.81)
RDW: 19.3 % — AB (ref 11.5–15.5)
WBC: 4.7 10*3/uL (ref 4.0–10.5)

## 2018-01-25 LAB — MRSA PCR SCREENING: MRSA BY PCR: NEGATIVE

## 2018-01-25 LAB — PREPARE RBC (CROSSMATCH)

## 2018-01-25 MED ORDER — MEMANTINE HCL 10 MG PO TABS
10.0000 mg | ORAL_TABLET | Freq: Two times a day (BID) | ORAL | Status: DC
  Administered 2018-01-25 – 2018-01-27 (×3): 10 mg via ORAL
  Filled 2018-01-25 (×2): qty 2
  Filled 2018-01-25 (×2): qty 1

## 2018-01-25 MED ORDER — ACETAMINOPHEN 650 MG RE SUPP: 650.0000 mg | Freq: Four times a day (QID) | RECTAL | Status: DC | PRN

## 2018-01-25 MED ORDER — DORZOLAMIDE HCL 2 % OP SOLN
1.0000 [drp] | Freq: Two times a day (BID) | OPHTHALMIC | Status: DC
  Administered 2018-01-25 – 2018-01-29 (×6): 1 [drp] via OPHTHALMIC
  Filled 2018-01-25: qty 10

## 2018-01-25 MED ORDER — SODIUM CHLORIDE 0.9 % IV SOLN
10.0000 mL/h | Freq: Once | INTRAVENOUS | Status: AC
  Administered 2018-01-25: 10 mL/h via INTRAVENOUS

## 2018-01-25 MED ORDER — LATANOPROST 0.005 % OP SOLN
1.0000 [drp] | Freq: Every day | OPHTHALMIC | Status: DC
Start: 2018-01-25 — End: 2018-01-29
  Administered 2018-01-25 – 2018-01-28 (×3): 1 [drp] via OPHTHALMIC
  Filled 2018-01-25: qty 2.5

## 2018-01-25 MED ORDER — CARBIDOPA-LEVODOPA 25-100 MG PO TABS
0.5000 | ORAL_TABLET | Freq: Three times a day (TID) | ORAL | Status: DC
  Administered 2018-01-25 – 2018-01-29 (×10): 0.5 via ORAL
  Filled 2018-01-25 (×2): qty 1
  Filled 2018-01-25 (×2): qty 0.5
  Filled 2018-01-25: qty 1
  Filled 2018-01-25: qty 0.5
  Filled 2018-01-25: qty 1
  Filled 2018-01-25: qty 0.5
  Filled 2018-01-25 (×3): qty 1

## 2018-01-25 MED ORDER — ONDANSETRON HCL 4 MG/2ML IJ SOLN: 4.0000 mg | Freq: Four times a day (QID) | INTRAMUSCULAR | Status: DC | PRN

## 2018-01-25 MED ORDER — ENSURE ENLIVE PO LIQD
237.0000 mL | Freq: Two times a day (BID) | ORAL | Status: DC
  Administered 2018-01-26 (×2): 237 mL via ORAL

## 2018-01-25 MED ORDER — PANTOPRAZOLE SODIUM 40 MG PO TBEC
40.0000 mg | DELAYED_RELEASE_TABLET | Freq: Every day | ORAL | Status: DC
  Administered 2018-01-26 – 2018-01-29 (×4): 40 mg via ORAL
  Filled 2018-01-25 (×4): qty 1

## 2018-01-25 MED ORDER — HYDRALAZINE HCL 20 MG/ML IJ SOLN
10.0000 mg | Freq: Three times a day (TID) | INTRAMUSCULAR | Status: DC | PRN
  Administered 2018-01-25: 10 mg via INTRAVENOUS
  Filled 2018-01-25: qty 1

## 2018-01-25 MED ORDER — ONDANSETRON HCL 4 MG PO TABS: 4.0000 mg | ORAL_TABLET | Freq: Four times a day (QID) | ORAL | Status: DC | PRN

## 2018-01-25 MED ORDER — LISINOPRIL 2.5 MG PO TABS
5.0000 mg | ORAL_TABLET | Freq: Every day | ORAL | Status: DC
  Administered 2018-01-26: 5 mg via ORAL
  Filled 2018-01-25: qty 2

## 2018-01-25 MED ORDER — SODIUM CHLORIDE 0.9 % IV SOLN: Freq: Once | INTRAVENOUS | Status: DC

## 2018-01-25 MED ORDER — LORAZEPAM 2 MG/ML IJ SOLN: 1.0000 mg | INTRAMUSCULAR | Status: DC | PRN

## 2018-01-25 MED ORDER — ACETAMINOPHEN 325 MG PO TABS: 650.0000 mg | ORAL_TABLET | Freq: Four times a day (QID) | ORAL | Status: DC | PRN

## 2018-01-25 MED ORDER — DOXAZOSIN MESYLATE 4 MG PO TABS
4.0000 mg | ORAL_TABLET | Freq: Every day | ORAL | Status: DC
  Administered 2018-01-25 – 2018-01-28 (×3): 4 mg via ORAL
  Filled 2018-01-25 (×3): qty 1
  Filled 2018-01-25: qty 4

## 2018-01-25 NOTE — H&P (Addendum)
[facility] History and Physical  [name] QIW:[id number] DOB: [birth year] DOA: 01/25/2018  Referring physician:  PCP: Marin Olp, MD   Chief Complaint: "I'm fine."  HPI: Alexander Rice is a 84 y.o. male past history significant for kidney disease, heart disease, BPH, hypertension, depression with fatigue.  His daughter who is the POA due to concerns that his fatigue me that he is anemic again.  Patient recently discharged from skilled nursing facility on February 19.  Has chronic issue of low sodium.  Medication was stopped at discharge.  Daughter concerned the patient's sodium may also be low.  ED Course: Patient found to have low Na &  hemoglobin.  Blood transfusion started.  Was consulted for admission.   Review of Systems:  As per HPI otherwise 10 point review of systems negative.    Past Medical History:  Diagnosis Date  . Adenomatous colon polyp 02/1996, 02/2011   TA polyp 02/2011  . Anemia in chronic renal disease 11/02/2011  . B12 deficiency   . BPH (benign prostatic hyperplasia)   . CAD (coronary artery disease)   . CVD (cardiovascular disease)   . Dementia   . Diverticulosis   . Esophageal stricture   . GERD (gastroesophageal reflux disease)   . Glaucoma   . Heart palpitations   . Hemorrhoids   . Hiatal hernia   . History of colonic polyps 09/29/2006   Polyps age 10, Dr. Fuller Plan stated no further colonoscopy due to age. Confirmed with daughter given alzheimers, CAD history    . Hyperlipidemia   . Hypertension   . MDS (myelodysplastic syndrome) ([place])    Past Surgical History:  Procedure Laterality Date  . CAROTID ENDARTERECTOMY    . CHOLECYSTECTOMY N/A 09/07/2017   Procedure: LAPAROSCOPIC CHOLECYSTECTOMY WITH INTRAOPERATIVE CHOLANGIOGRAM, REPAIR OF CHOLECYSTODUODENAL FISTULA, UPPER ENDOSCOPY;  Surgeon: Alphonsa Overall, MD;  Location: [facility];  Service: General;  Laterality: N/A;  . COLONOSCOPY    . ERCP N/A 09/06/2017   Procedure: ENDOSCOPIC  RETROGRADE CHOLANGIOPANCREATOGRAPHY (ERCP);  Surgeon: Milus Banister, MD;  Location: [facility];  Service: Endoscopy;  Laterality: N/A;  . GLAUCOMA SURGERY Bilateral 11/19/12   pt's report  . HIP ARTHROPLASTY Right 11/16/2017   Procedure: ARTHROPLASTY BIPOLAR HIP (HEMIARTHROPLASTY) RIGHT;  Surgeon: Nicholes Stairs, MD;  Location: [facility];  Service: Orthopedics;  Laterality: Right;  . INGUINAL HERNIA REPAIR     right side/ twice  . PTCA     stent  . QUADRICEPS REPAIR     muscle attachment  . TONSILLECTOMY     Social History:  reports that he quit smoking about 49 years ago. he has never used smokeless tobacco. He reports that he drinks about 4.2 oz of alcohol per week. He reports that he does not use drugs.  No Known Allergies  Family History  Problem Relation Age of Onset  . COPD Mother   . Alcohol abuse Father      Prior to Admission medications   Medication Sig Start Date End Date Taking? Authorizing Provider  aspirin 81 MG tablet Take 1 tablet (81 mg total) by mouth 2 (two) times daily. 11/19/17  Yes Adhikari Emeline General, MD  carbidopa-levodopa (SINEMET IR) 25-100 MG tablet Take 1/2 tablet three times a day Patient taking differently: Take 0.5 tablets by mouth 3 (three) times daily. Take 1/2 tablet three times a day 07/08/17  Yes Cameron Sprang, MD  [employer] OMEGA-3 KRILL OIL PO Take 350 mg by mouth daily. Reported on 01/16/2016  Yes [provider]  cyanocobalamin 100 MCG tablet Take 100 mcg by mouth daily.    Yes [provider]  dorzolamide (TRUSOPT) 2 % ophthalmic solution Place 1 drop into both eyes 2 (two) times daily.     Yes [provider]  doxazosin (CARDURA) 4 MG tablet TAKE 1 TABLET BY MOUTH AT  BEDTIME 10/01/17  Yes Marin Olp, MD  latanoprost (XALATAN) 0.005 % ophthalmic solution Place 1 drop into both eyes at bedtime.   Yes [provider]  lisinopril (PRINIVIL,ZESTRIL) 5 MG tablet TAKE 1 TABLET BY MOUTH  DAILY 05/14/17  Yes  Marin Olp, MD  memantine (NAMENDA) 10 MG tablet TAKE 1 TABLET BY MOUTH TWO  TIMES DAILY 05/01/17  Yes Cameron Sprang, MD  Multiple Vitamin (MULTIVITAMIN) tablet Take 1 tablet by mouth daily.     Yes [provider]  omeprazole (PRILOSEC) 20 MG capsule TAKE 1 CAPSULE BY MOUTH  DAILY 03/26/17  Yes Marin Olp, MD  Probiotic Product ([employer] PROBIOTIC) CHEW Chew 1 tablet by mouth daily.    Yes [provider]  Pyridoxine HCl (VITAMIN B-6) 250 MG tablet Take 1 tablet (250 mg total) by mouth daily. 05/17/15  Yes [name], MD  simvastatin (ZOCOR) 40 MG tablet Hold for 2 to 4 weeks and recheck liver function tests at that time and if stable, can be restarted as per discretion of the PCP. 09/12/17  Yes Hosie Poisson, MD  acetaminophen (TYLENOL) 325 MG tablet Take 650 mg by mouth every 4 (four) hours as needed for mild pain or fever.    [provider]  amLODipine (NORVASC) 5 MG tablet Take 1 tablet (5 mg total) by mouth daily. Patient not taking: Reported on 01/25/2018 11/20/17   Marene Lenz, MD  docusate sodium (COLACE) 100 MG capsule Take 1 capsule (100 mg total) by mouth 2 (two) times daily. Patient not taking: Reported on 01/25/2018 09/12/17   Hosie Poisson, MD  epoetin alfa (PROCRIT) 87564 UNIT/ML injection Inject 1 mL (40,000 Units total) into the skin every 21 ( twenty-one) days. As needed for Hgb < 10g/dl 09/13/17   [place], Holli Humbles, NP  HYDROcodone-acetaminophen (NORCO/VICODIN) 5-325 MG tablet Take 1 tablet by mouth every 6 (six) hours as needed for moderate pain. Patient not taking: Reported on 01/25/2018 12/23/17   Caren Griffins, MD  sodium chloride 1 g tablet Take 1 tablet (1 g total) by mouth 2 (two) times daily with a meal. Patient not taking: Reported on 01/25/2018 11/19/17   Marene Lenz, MD   Physical Exam: Vitals:   01/25/18 1315 01/25/18 1400 01/25/18 1415 01/25/18 1450  BP: (!) 101/40 (!) 97/38 (!) 92/40 (!) 97/42  Pulse: (!) 58 (!) 59  61 (!) 58  Resp: 12 11 11 11   Temp:    (!) 97.5 F (36.4 C)  TempSrc:    Oral  SpO2: 100% 100% 99% 100%    Wt Readings from Last 3 Encounters:  11/12/17 74.9 kg (165 lb 2 oz)  09/06/17 76.7 kg (169 lb)  07/08/17 77.1 kg (170 lb)    General:  Appears calm and comfortable; a&Ox1 Eyes:  PERRL, EOMI, normal lids, iris ENT:  grossly normal hearing, lips & tongue Neck:  no LAD, masses or thyromegaly Cardiovascular:  RRR, no m/r/g. No LE edema.  Respiratory:  CTA bilaterally, no w/r/r. Normal respiratory effort. Abdomen:  soft, ntnd Skin:  no rash or induration seen on limited exam Musculoskeletal:  grossly normal  tone BUE/BLE Psychiatric:  grossly normal mood and affect, speech fluent and appropriate Neurologic:  CN 2-12 grossly intact, moves all extremities in coordinated fashion.          Labs on Admission:  Basic Metabolic Panel: Recent Labs  Lab 01/25/18 1206  NA 119*  K 4.8  CL 91*  CO2 22  GLUCOSE 105*  BUN 11  CREATININE 0.57*  CALCIUM 8.0*   Liver Function Tests: Recent Labs  Lab 01/25/18 1206  AST 37  ALT 30  ALKPHOS 104  BILITOT 0.9  PROT 5.4*  ALBUMIN 3.1*   No results for input(s): LIPASE, AMYLASE in the last 168 hours. No results for input(s): AMMONIA in the last 168 hours. CBC: Recent Labs  Lab 01/25/18 1302  WBC 4.7  NEUTROABS 2.9  HGB 6.6*  HCT 18.8*  MCV 87.9  PLT 335   Cardiac Enzymes: No results for input(s): CKTOTAL, CKMB, CKMBINDEX, TROPONINI in the last 168 hours.  BNP (last 3 results) No results for input(s): BNP in the last 8760 hours.  ProBNP (last 3 results) No results for input(s): PROBNP in the last 8760 hours.   Creatinine clearance cannot be calculated (Unknown ideal weight.)  CBG: No results for input(s): GLUCAP in the last 168 hours.  Radiological Exams on Admission: No results found.  EKG: no new  Assessment/Plan Principal Problem:   Acute blood loss anemia Active Problems:   Hyperlipidemia    Essential hypertension   BPH (benign prostatic hyperplasia)   MDS (myelodysplastic syndrome) ([facility])   Glaucoma   Severe dementia   Dementia without behavioral disturbance    Blood loss anemia 6.6 hgb on admit 2u ordered by EDP Likely due to chronic dz (MDS) Neg DRe per EDP yes History of anemia IV fluids History of bleed np GI consult no ordered Avoid NSAIDs BP soft but should respond to blood UA pending   Low Na 119 on admit Prn ativan Seizure precautions Will recheck after transfusion Neuro checks  Dementia/parkinsons Cont sinemet, namenda  Glaucoma Cont trusopt, Xalatan  BPH Cont cardura  Hypertension When necessary hydralazine 10 mg IV as needed for severe blood pressure Cont lisinopril  GERD Cont PPI  HLD Hold lipitor  Code Status: FC  DVT Prophylaxis: SCDs Family Communication: dgtr POA at bedside Disposition Plan: Pending Improvement  Status: inpt, tele  Elwin Mocha, MD Family Medicine [facility] [email or web address] Password [email or web address]

## 2018-01-25 NOTE — ED Provider Notes (Signed)
[facility] DEPT Provider Note   CSN: [id number] Arrival date & time: 01/25/18  1124     History   Chief Complaint Chief Complaint  Patient presents with  . Fatigue    HPI Alexander Rice is a 84 y.o. male.  Pt presents to the ED today with fatigue.  Pt has a hx of myelodysplasia and is followed by Dr. Marin Olp.  He was admitted from 2/2-2/4 for anemia requiring blood transfusion.  Family is concerned he is anemic again.  Pt also has dementia and is unable to give any hx.  He denies any pain.  He did fracture his right hip on 12/24 and had his hip repaired on 12/29.  He required a blood transfusion also during that hospital stay.  The pt has since dislocated his hip and family decided not to do any other intervention on the hip.  Pt is now at home with his daughter who said that he has not been able to see Dr. Marin Olp and get his procrit shots.  Pt d/c from SNF on 2/19.  He did have trouble with his sodium being low on his admission on 12/25 and was placed on salt tablets.  Sodium improved with this treatment.  His daughter said they were not told to continue salt tablets after d/c home.             Past Medical History:  Diagnosis Date  . Adenomatous colon polyp 02/1996, 02/2011   TA polyp 02/2011  . Anemia in chronic renal disease 11/02/2011  . B12 deficiency   . BPH (benign prostatic hyperplasia)   . CAD (coronary artery disease)   . CVD (cardiovascular disease)   . Dementia   . Diverticulosis   . Esophageal stricture   . GERD (gastroesophageal reflux disease)   . Glaucoma   . Heart palpitations   . Hemorrhoids   . Hiatal hernia   . History of colonic polyps 09/29/2006   Polyps age 68, Dr. Fuller Plan stated no further colonoscopy due to age. Confirmed with daughter given alzheimers, CAD history    . Hyperlipidemia   . Hypertension   . MDS (myelodysplastic syndrome) [facility])     Patient Active Problem List   Diagnosis Date Noted  .  Acute blood loss anemia 12/22/2017  . Dementia due to Parkinson's disease with behavioral disturbance ([place]) 12/22/2017  . Pressure injury of skin 11/16/2017  . Dementia without behavioral disturbance 11/15/2017  . Closed displaced fracture of right femoral neck ([place]) 11/12/2017  . Choleduodenal fistula s/p cholecystectomy/duodenal resection 09/07/2017 09/07/2017  . Choledocholithiasis   . Fall 09/04/2017  . AAA (abdominal aortic aneurysm) ([place]) 09/04/2017  . Bradycardia 04/27/2016  . Unresponsiveness 04/27/2016  . Severe dementia 03/31/2015  . Spinal stenosis of lumbar region 03/31/2015  . Hyponatremia 03/22/2015  . Pernicious anemia 03/11/2015  . Glaucoma 12/01/2014  . Pulmonary nodule 04/08/2012  . MDS (myelodysplastic syndrome) ([place]) 05/14/2011  . ANEMIA, B12 DEFICIENCY 07/03/2007  . Hyperlipidemia 09/29/2006  . Essential hypertension 09/29/2006  . CAD (coronary artery disease) 09/29/2006  . GERD 09/29/2006  . BPH (benign prostatic hyperplasia) 09/29/2006    Past Surgical History:  Procedure Laterality Date  . CAROTID ENDARTERECTOMY    . CHOLECYSTECTOMY N/A 09/07/2017   Procedure: LAPAROSCOPIC CHOLECYSTECTOMY WITH INTRAOPERATIVE CHOLANGIOGRAM, REPAIR OF CHOLECYSTODUODENAL FISTULA, UPPER ENDOSCOPY;  Surgeon: Alphonsa Overall, MD;  Location: [facility];  Service: General;  Laterality: N/A;  . COLONOSCOPY    . ERCP N/A 09/06/2017   Procedure: ENDOSCOPIC RETROGRADE  CHOLANGIOPANCREATOGRAPHY (ERCP);  Surgeon: Milus Banister, MD;  Location: [facility];  Service: Endoscopy;  Laterality: N/A;  . GLAUCOMA SURGERY Bilateral 11/19/12   pt's report  . HIP ARTHROPLASTY Right 11/16/2017   Procedure: ARTHROPLASTY BIPOLAR HIP (HEMIARTHROPLASTY) RIGHT;  Surgeon: Nicholes Stairs, MD;  Location: [facility];  Service: Orthopedics;  Laterality: Right;  . INGUINAL HERNIA REPAIR     right side/ twice  . PTCA     stent  . QUADRICEPS REPAIR     muscle attachment  . TONSILLECTOMY         Home  Medications    Prior to Admission medications   Medication Sig Start Date End Date Taking? Authorizing Provider  aspirin 81 MG tablet Take 1 tablet (81 mg total) by mouth 2 (two) times daily. 11/19/17  Yes Adhikari Emeline General, MD  carbidopa-levodopa (SINEMET IR) 25-100 MG tablet Take 1/2 tablet three times a day Patient taking differently: Take 0.5 tablets by mouth 3 (three) times daily. Take 1/2 tablet three times a day 07/08/17  Yes Cameron Sprang, MD  [employer] OMEGA-3 KRILL OIL PO Take 350 mg by mouth daily. Reported on 01/16/2016   Yes [provider]  cyanocobalamin 100 MCG tablet Take 100 mcg by mouth daily.    Yes [provider]  dorzolamide (TRUSOPT) 2 % ophthalmic solution Place 1 drop into both eyes 2 (two) times daily.     Yes [provider]  doxazosin (CARDURA) 4 MG tablet TAKE 1 TABLET BY MOUTH AT  BEDTIME 10/01/17  Yes Marin Olp, MD  latanoprost (XALATAN) 0.005 % ophthalmic solution Place 1 drop into both eyes at bedtime.   Yes [provider]  lisinopril (PRINIVIL,ZESTRIL) 5 MG tablet TAKE 1 TABLET BY MOUTH  DAILY 05/14/17  Yes Marin Olp, MD  memantine (NAMENDA) 10 MG tablet TAKE 1 TABLET BY MOUTH TWO  TIMES DAILY 05/01/17  Yes Cameron Sprang, MD  Multiple Vitamin (MULTIVITAMIN) tablet Take 1 tablet by mouth daily.     Yes [provider]  omeprazole (PRILOSEC) 20 MG capsule TAKE 1 CAPSULE BY MOUTH  DAILY 03/26/17  Yes Marin Olp, MD  Probiotic Product ([employer] PROBIOTIC) CHEW Chew 1 tablet by mouth daily.    Yes [provider]  Pyridoxine HCl (VITAMIN B-6) 250 MG tablet Take 1 tablet (250 mg total) by mouth daily. 05/17/15  Yes [name], MD  simvastatin (ZOCOR) 40 MG tablet Hold for 2 to 4 weeks and recheck liver function tests at that time and if stable, can be restarted as per discretion of the PCP. 09/12/17  Yes Hosie Poisson, MD  acetaminophen (TYLENOL) 325 MG tablet Take 650 mg by mouth every 4 (four) hours  as needed for mild pain or fever.    [provider]  amLODipine (NORVASC) 5 MG tablet Take 1 tablet (5 mg total) by mouth daily. Patient not taking: Reported on 01/25/2018 11/20/17   Marene Lenz, MD  docusate sodium (COLACE) 100 MG capsule Take 1 capsule (100 mg total) by mouth 2 (two) times daily. Patient not taking: Reported on 01/25/2018 09/12/17   Hosie Poisson, MD  epoetin alfa (PROCRIT) 76283 UNIT/ML injection Inject 1 mL (40,000 Units total) into the skin every 21 ( twenty-one) days. As needed for Hgb < 10g/dl 09/13/17   [place], Holli Humbles, NP  HYDROcodone-acetaminophen (NORCO/VICODIN) 5-325 MG tablet Take 1 tablet by mouth every 6 (six) hours as needed for moderate pain. Patient not taking: Reported on 01/25/2018  12/23/17   Caren Griffins, MD  sodium chloride 1 g tablet Take 1 tablet (1 g total) by mouth 2 (two) times daily with a meal. Patient not taking: Reported on 01/25/2018 11/19/17   Marene Lenz, MD    Family History Family History  Problem Relation Age of Onset  . COPD Mother   . Alcohol abuse Father     Social History Social History   Tobacco Use  . Smoking status: Former Smoker    Last attempt to quit: 11/19/1968    Years since quitting: 49.2  . Smokeless tobacco: Never Used  . Tobacco comment: never used tobacco  Substance Use Topics  . Alcohol use: Yes    Alcohol/week: 4.2 oz    Types: 7 Standard drinks or equivalent per week    Comment: Wine/Beer  . Drug use: No     Allergies   Patient has no known allergies.   Review of Systems Review of Systems  Unable to perform ROS: Dementia  All other systems reviewed and are negative.    Physical Exam Updated Vital Signs BP (!) 92/40   Pulse 61   Temp 98 F (36.7 C) (Oral)   Resp 11   SpO2 99%   Physical Exam  Constitutional: He appears well-developed and well-nourished.  HENT:  Head: Normocephalic and atraumatic.  Right Ear: External ear normal.  Left Ear: External ear normal.    Nose: Nose normal.  Mouth/Throat: Oropharynx is clear and moist.  Eyes: Conjunctivae and EOM are normal. Pupils are equal, round, and reactive to light.  Neck: Normal range of motion. Neck supple.  Cardiovascular: Normal rate, regular rhythm, normal heart sounds and intact distal pulses.  Pulmonary/Chest: Effort normal and breath sounds normal.  Abdominal: Soft. Bowel sounds are normal.  Musculoskeletal:       Right hip: He exhibits deformity.  No pain with palpation  Neurological: He is alert.  Oriented to person  Skin: Skin is warm. Capillary refill takes less than 2 seconds.  Psychiatric: He has a normal mood and affect. His behavior is normal.  Nursing note and vitals reviewed.    ED Treatments / Results  Labs (all labs ordered are listed, but only abnormal results are displayed) Labs Reviewed  COMPREHENSIVE METABOLIC PANEL - Abnormal; Notable for the following components:      Result Value   Sodium 119 (*)    Chloride 91 (*)    Glucose, Bld 105 (*)    Creatinine, Ser 0.57 (*)    Calcium 8.0 (*)    Total Protein 5.4 (*)    Albumin 3.1 (*)    All other components within normal limits  CBC WITH DIFFERENTIAL/PLATELET - Abnormal; Notable for the following components:   RBC 2.14 (*)    Hemoglobin 6.6 (*)    HCT 18.8 (*)    RDW 19.3 (*)    All other components within normal limits  CBC WITH DIFFERENTIAL/PLATELET  URINALYSIS, ROUTINE W REFLEX MICROSCOPIC  TYPE AND SCREEN  PREPARE RBC (CROSSMATCH)    EKG  EKG Interpretation None       Radiology No results found.  Procedures Procedures (including critical care time)  Medications Ordered in ED Medications  0.9 %  sodium chloride infusion (10 mL/hr Intravenous New Bag/Given 01/25/18 1417)     Initial Impression / Assessment and Plan / ED Course  I have reviewed the triage vital signs and the nursing notes.  Pertinent labs & imaging results that were available during my care of  the patient were reviewed by me  and considered in my medical decision making (see chart for details).    Pt is anemic.  Last hgb 9.8 1 month ago.  2 units of prbcs ordered.  Pt d/w Dr. Aggie Moats ([facility]) for admission.   CRITICAL CARE Performed by: Isla Pence   Total critical care time: 30 minutes  Critical care time was exclusive of separately billable procedures and treating other patients.  Critical care was necessary to treat or prevent imminent or life-threatening deterioration.  Critical care was time spent personally by me on the following activities: development of treatment plan with patient and/or surrogate as well as nursing, discussions with consultants, evaluation of patient's response to treatment, examination of patient, obtaining history from patient or surrogate, ordering and performing treatments and interventions, ordering and review of laboratory studies, ordering and review of radiographic studies, pulse oximetry and re-evaluation of patient's condition.   Final Clinical Impressions(s) / ED Diagnoses   Final diagnoses:  Symptomatic anemia  Hyponatremia    ED Discharge Orders    None       Isla Pence, MD 01/25/18 1422

## 2018-01-25 NOTE — ED Notes (Signed)
Call daughter (emergency contact and POA) with updates:  Alexander Rice [phone]

## 2018-01-25 NOTE — Telephone Encounter (Signed)
Sounds good. Thanks for update.

## 2018-01-25 NOTE — ED Notes (Addendum)
Seizure pads placed on bed for hyponatremia.

## 2018-01-25 NOTE — ED Notes (Signed)
ED TO INPATIENT HANDOFF REPORT  Name/Age/Gender Alexander Rice 84 y.o. male  Code Status Code Status History    Date Active Date Inactive Code Status Order ID Comments User Context   12/22/2017 02:10 12/23/2017 21:26 Full Code [id number]  Norval Morton, MD ED   11/12/2017 20:33 11/19/2017 19:25 Full Code [id number]  Etta Quill, DO ED   09/04/2017 21:54 09/12/2017 17:50 Full Code [id number]  Ivor Costa, MD ED   04/26/2016 21:11 04/28/2016 18:04 Full Code [id number]  Norval Morton, MD Inpatient   03/22/2015 20:43 03/23/2015 17:00 Full Code [id number]  Ivor Costa, MD Inpatient      Home/SNF/Other Home  Chief Complaint fatigue  Level of Care/Admitting Diagnosis ED Disposition    ED Disposition Condition [facility] Area: [facility] [[id number]]  Level of Care: Stepdown [14]  Admit to SDU based on following criteria: Hemodynamic compromise or significant risk of instability:  Patient requiring short term acute titration and management of vasoactive drips, and invasive monitoring (i.e., CVP and Arterial line).  Diagnosis: Acute blood loss anemia [202542]  Admitting Physician: Elwin Mocha [[id number]]  Attending Physician: HOBBS, PHILLIP Jerilynn Mages [[id number]]  Estimated length of stay: 3 - 4 days  Certification:: I certify this patient will need inpatient services for at least 2 midnights  PT Class (Do Not Modify): Inpatient [101]  PT Acc Code (Do Not Modify): Private [1]       Medical History Past Medical History:  Diagnosis Date  . Adenomatous colon polyp 02/1996, 02/2011   TA polyp 02/2011  . Anemia in chronic renal disease 11/02/2011  . B12 deficiency   . BPH (benign prostatic hyperplasia)   . CAD (coronary artery disease)   . CVD (cardiovascular disease)   . Dementia   . Diverticulosis   . Esophageal stricture   . GERD (gastroesophageal reflux disease)   . Glaucoma   . Heart palpitations   . Hemorrhoids   . Hiatal hernia   . History of colonic  polyps 09/29/2006   Polyps age 64, Dr. Fuller Plan stated no further colonoscopy due to age. Confirmed with daughter given alzheimers, CAD history    . Hyperlipidemia   . Hypertension   . MDS (myelodysplastic syndrome) ([place])     Allergies No Known Allergies  IV Location/Drains/Wounds Patient Lines/Drains/Airways Status   Active Line/Drains/Airways    Name:   Placement date:   Placement time:   Site:   Days:   Peripheral IV 01/25/18 Right Forearm   01/25/18    1221    Forearm   less than 1   Peripheral IV 01/25/18 Left Forearm   01/25/18    1425    Forearm   less than 1   External Urinary Catheter   12/22/17    1030    -   34   Incision (Closed) 11/16/17 Hip Right   11/16/17    0857     70   Pressure Injury 12/22/17 Deep Tissue Injury - Purple or maroon localized area of discolored intact skin or blood-filled blister due to damage of underlying soft tissue from pressure and/or shear. 1 x 1.5 maroon color   12/22/17    0400     34          Labs/Imaging Results for orders placed or performed during the hospital encounter of 01/25/18 (from the past 48 hour(s))  Comprehensive metabolic panel     Status: Abnormal   Collection Time: 01/25/18 12:06 PM  Result Value Ref Range   Sodium 119 (LL) 135 - 145 mmol/L    Comment: CRITICAL RESULT CALLED TO, READ BACK BY AND VERIFIED WITH: HALL,C AT 1:05PM ON 01/25/18 BY FESTERMAN,C    Potassium 4.8 3.5 - 5.1 mmol/L   Chloride 91 (L) 101 - 111 mmol/L   CO2 22 22 - 32 mmol/L   Glucose, Bld 105 (H) 65 - 99 mg/dL   BUN 11 6 - 20 mg/dL   Creatinine, Ser 0.57 (L) 0.61 - 1.24 mg/dL   Calcium 8.0 (L) 8.9 - 10.3 mg/dL   Total Protein 5.4 (L) 6.5 - 8.1 g/dL   Albumin 3.1 (L) 3.5 - 5.0 g/dL   AST 37 15 - 41 U/L   ALT 30 17 - 63 U/L   Alkaline Phosphatase 104 38 - 126 U/L   Total Bilirubin 0.9 0.3 - 1.2 mg/dL   GFR calc non Af Amer >60 >60 mL/min   GFR calc Af Amer >60 >60 mL/min    Comment: (NOTE) The eGFR has been calculated using the CKD EPI  equation. This calculation has not been validated in all clinical situations. eGFR's persistently <60 mL/min signify possible Chronic Kidney Disease.    Anion gap 6 5 - 15    Comment: Performed at [facility], [place] [address], [place], [place] [address]  Type and screen [place]     Status: None (Preliminary result)   Collection Time: 01/25/18 12:06 PM  Result Value Ref Range   ABO/RH(D) A POS    Antibody Screen NEG    Sample Expiration 01/28/2018    Unit Number [id number]    Blood Component Type RED CELLS,LR    Unit division 00    Status of Unit ISSUED    Transfusion Status OK TO TRANSFUSE    Crossmatch Result      Compatible Performed at [facility], [place] [address] [place], [place] [address]    Unit Number [id number]    Blood Component Type RED CELLS,LR    Unit division 00    Status of Unit ISSUED    Transfusion Status OK TO TRANSFUSE    Crossmatch Result Compatible   CBC with Differential/Platelet     Status: Abnormal   Collection Time: 01/25/18  1:02 PM  Result Value Ref Range   WBC 4.7 4.0 - 10.5 K/uL   RBC 2.14 (L) 4.22 - 5.81 MIL/uL   Hemoglobin 6.6 (LL) 13.0 - 17.0 g/dL    Comment: RESULT REPEATED AND VERIFIED CRITICAL RESULT CALLED TO, READ BACK BY AND VERIFIED WITH: STANHOPE,M AT 1420 ON [id number] BY HOOKER,B    HCT 18.8 (L) 39.0 - 52.0 %   MCV 87.9 78.0 - 100.0 fL   MCH 30.8 26.0 - 34.0 pg   MCHC 35.1 30.0 - 36.0 g/dL   RDW 19.3 (H) 11.5 - 15.5 %   Platelets 335 150 - 400 K/uL   Neutrophils Relative % 61 %   Neutro Abs 2.9 1.7 - 7.7 K/uL   Lymphocytes Relative 25 %   Lymphs Abs 1.2 0.7 - 4.0 K/uL   Monocytes Relative 12 %   Monocytes Absolute 0.6 0.1 - 1.0 K/uL   Eosinophils Relative 2 %   Eosinophils Absolute 0.1 0.0 - 0.7 K/uL   Basophils Relative 0 %   Basophils Absolute 0.0 0.0 - 0.1 K/uL    Comment: Performed at [facility], [place] [address], [place], [place]  [address]  Prepare RBC  Status: None   Collection Time: 01/25/18  2:05 PM  Result Value Ref Range   Order Confirmation      ORDER PROCESSED BY BLOOD BANK Performed at [facility], [place] [address] [place], [place] [address]    No results found.  Pending Labs Unresulted Labs (From admission, onward)   Start     Ordered   01/25/18 1206  CBC with Differential  Once,   STAT     01/25/18 1205   01/25/18 1206  Urinalysis, Routine w reflex microscopic  STAT,   STAT     01/25/18 1205      Vitals/Pain Today's Vitals   01/25/18 1900 01/25/18 1915 01/25/18 1930 01/25/18 1957  BP: (!) 125/58 (!) 129/59 (!) 150/64   Pulse: 60 (!) 59 65   Resp: 11 11 11    Temp:      TempSrc:      SpO2: 98% 98% 98%   PainSc:    Asleep    Isolation Precautions No active isolations  Medications Medications  hydrALAZINE (APRESOLINE) injection 10 mg (not administered)  0.9 %  sodium chloride infusion (10 mL/hr Intravenous Transfusing/Transfer 01/25/18 1956)    Mobility non-ambulatory

## 2018-01-25 NOTE — ED Notes (Addendum)
UNSUCCESSFUL RECOLLECTION ATTEMPT

## 2018-01-25 NOTE — ED Triage Notes (Signed)
Pt arrived to [facility] from home c/o fatigue. Family reports that patient has been more lethargic than normal for the past few days. Per family, last time patient presented this way, he needed a blood transfusion. Pt has hx of parkinsons disease and myelodysplasia and is at baseline mentally.

## 2018-01-25 NOTE — ED Notes (Signed)
Pt tolerating blood transfusion well. Will monitor appropriately.

## 2018-01-25 NOTE — ED Notes (Signed)
Bed: WA04 Expected date:  Expected time:  Means of arrival:  Comments: 84 yo EMS

## 2018-01-25 NOTE — ED Notes (Signed)
Critical Sodium 119. Made primary RN [name] aware.

## 2018-01-25 NOTE — ED Notes (Signed)
ED Provider at bedside. 

## 2018-01-25 NOTE — ED Notes (Signed)
Patient does not show or report any signs of blood transfusion reaction. Will continue to monitor appropriately.

## 2018-01-26 DIAGNOSIS — Z79899 Other long term (current) drug therapy: Secondary | ICD-10-CM

## 2018-01-26 DIAGNOSIS — M1990 Unspecified osteoarthritis, unspecified site: Secondary | ICD-10-CM

## 2018-01-26 DIAGNOSIS — K219 Gastro-esophageal reflux disease without esophagitis: Secondary | ICD-10-CM

## 2018-01-26 DIAGNOSIS — G20 Parkinson's disease: Secondary | ICD-10-CM

## 2018-01-26 DIAGNOSIS — I2510 Atherosclerotic heart disease of native coronary artery without angina pectoris: Secondary | ICD-10-CM

## 2018-01-26 DIAGNOSIS — F0280 Dementia in other diseases classified elsewhere without behavioral disturbance: Secondary | ICD-10-CM

## 2018-01-26 DIAGNOSIS — E785 Hyperlipidemia, unspecified: Secondary | ICD-10-CM

## 2018-01-26 DIAGNOSIS — N400 Enlarged prostate without lower urinary tract symptoms: Secondary | ICD-10-CM

## 2018-01-26 LAB — CBC
HCT: 23.6 % — ABNORMAL LOW (ref 39.0–52.0)
HEMOGLOBIN: 8.4 g/dL — AB (ref 13.0–17.0)
MCH: 30.8 pg (ref 26.0–34.0)
MCHC: 35.6 g/dL (ref 30.0–36.0)
MCV: 86.4 fL (ref 78.0–100.0)
PLATELETS: 378 10*3/uL (ref 150–400)
RBC: 2.73 MIL/uL — AB (ref 4.22–5.81)
RDW: 17.9 % — ABNORMAL HIGH (ref 11.5–15.5)
WBC: 4.3 10*3/uL (ref 4.0–10.5)

## 2018-01-26 LAB — BASIC METABOLIC PANEL
ANION GAP: 6 (ref 5–15)
BUN: 13 mg/dL (ref 6–20)
CALCIUM: 7.9 mg/dL — AB (ref 8.9–10.3)
CO2: 21 mmol/L — AB (ref 22–32)
CREATININE: 0.47 mg/dL — AB (ref 0.61–1.24)
Chloride: 97 mmol/L — ABNORMAL LOW (ref 101–111)
GFR calc non Af Amer: >60 mL/min (ref >60)
Glucose, Bld: 90 mg/dL (ref 65–99)
Potassium: 4.1 mmol/L (ref 3.5–5.1)
SODIUM: 124 mmol/L — AB (ref 135–145)

## 2018-01-26 LAB — HEMOGLOBIN AND HEMATOCRIT, BLOOD
HEMATOCRIT: 24.5 % — AB (ref 39.0–52.0)
HEMOGLOBIN: 8.9 g/dL — AB (ref 13.0–17.0)

## 2018-01-26 MED ORDER — SODIUM CHLORIDE 0.9 % IV SOLN
INTRAVENOUS | Status: DC
  Administered 2018-01-27: 01:00:00 via INTRAVENOUS

## 2018-01-26 MED ORDER — LISINOPRIL 5 MG PO TABS
2.5000 mg | ORAL_TABLET | Freq: Every day | ORAL | Status: DC
  Administered 2018-01-27: 2.5 mg via ORAL
  Filled 2018-01-26: qty 1

## 2018-01-26 NOTE — Consult Note (Signed)
Referral MD  Reason for Referral: Myelodysplasia with recurrent anemia; severe dementia  Chief Complaint  Patient presents with  . Fatigue  : Patient cannot give any history.  HPI: [name] is well-known to me.  He is a 84 year old white male.  I have known him for over 10 years.  He has myelodysplasia.  He had been on Procrit.  I think we typically see him every 3-4 weeks.  He is losing his response to Procrit.  He has been getting transfused.  He has bad dementia.  He has Parkinson's disease.  He has bad osteoarthritis.  He has been admitted to the hospital probably for times this year.  He really is not aware of what is going on.  His daughter is the healthcare power of attorney.  Is hard to say if he is eating.  When he came in, his white cell count was 4.7.  Hemoglobin 6.6.  Platelet count 335.  He was transfused.  He got 2 units of blood.  This morning, his hemoglobin is 8.4.  He has some hyponatremia.  He has no quality of life.  Overall, his performance status is ECOG 3-4.    Past Medical History:  Diagnosis Date  . Adenomatous colon polyp 02/1996, 02/2011   TA polyp 02/2011  . Anemia in chronic renal disease 11/02/2011  . B12 deficiency   . BPH (benign prostatic hyperplasia)   . CAD (coronary artery disease)   . CVD (cardiovascular disease)   . Dementia   . Diverticulosis   . Esophageal stricture   . GERD (gastroesophageal reflux disease)   . Glaucoma   . Heart palpitations   . Hemorrhoids   . Hiatal hernia   . History of colonic polyps 09/29/2006   Polyps age 47, Dr. Fuller Plan stated no further colonoscopy due to age. Confirmed with daughter given alzheimers, CAD history    . Hyperlipidemia   . Hypertension   . MDS (myelodysplastic syndrome) ([place])   :  Past Surgical History:  Procedure Laterality Date  . CAROTID ENDARTERECTOMY    . CHOLECYSTECTOMY N/A 09/07/2017   Procedure: LAPAROSCOPIC CHOLECYSTECTOMY WITH INTRAOPERATIVE CHOLANGIOGRAM, REPAIR OF  CHOLECYSTODUODENAL FISTULA, UPPER ENDOSCOPY;  Surgeon: Alphonsa Overall, MD;  Location: [facility];  Service: General;  Laterality: N/A;  . COLONOSCOPY    . ERCP N/A 09/06/2017   Procedure: ENDOSCOPIC RETROGRADE CHOLANGIOPANCREATOGRAPHY (ERCP);  Surgeon: Milus Banister, MD;  Location: [facility];  Service: Endoscopy;  Laterality: N/A;  . GLAUCOMA SURGERY Bilateral 11/19/12   pt's report  . HIP ARTHROPLASTY Right 11/16/2017   Procedure: ARTHROPLASTY BIPOLAR HIP (HEMIARTHROPLASTY) RIGHT;  Surgeon: Nicholes Stairs, MD;  Location: [facility];  Service: Orthopedics;  Laterality: Right;  . INGUINAL HERNIA REPAIR     right side/ twice  . PTCA     stent  . QUADRICEPS REPAIR     muscle attachment  . TONSILLECTOMY    :   Current Facility-Administered Medications:  .  0.9 %  sodium chloride infusion, , Intravenous, Once, Elwin Mocha, MD .  0.9 %  sodium chloride infusion, , Intravenous, Continuous, Blount, Scarlette Shorts T, NP .  acetaminophen (TYLENOL) tablet 650 mg, 650 mg, Oral, Q6H PRN **OR** acetaminophen (TYLENOL) suppository 650 mg, 650 mg, Rectal, Q6H PRN, Elwin Mocha, MD .  carbidopa-levodopa (SINEMET IR) 25-100 MG per tablet immediate release 0.5 tablet, 0.5 tablet, Oral, TID, Elwin Mocha, MD, 0.5 tablet at 01/25/18 2151 .  dorzolamide (TRUSOPT) 2 % ophthalmic solution 1 drop, 1 drop, Both  Eyes, BID, Elwin Mocha, MD, 1 drop at 01/25/18 2151 .  doxazosin (CARDURA) tablet 4 mg, 4 mg, Oral, QHS, Elwin Mocha, MD, 4 mg at 01/25/18 2151 .  feeding supplement (ENSURE ENLIVE) (ENSURE ENLIVE) liquid 237 mL, 237 mL, Oral, BID BM, Elwin Mocha, MD .  hydrALAZINE (APRESOLINE) injection 10 mg, 10 mg, Intravenous, Q8H PRN, Elwin Mocha, MD, 10 mg at 01/25/18 2152 .  latanoprost (XALATAN) 0.005 % ophthalmic solution 1 drop, 1 drop, Both Eyes, QHS, Elwin Mocha, MD, 1 drop at 01/25/18 2151 .  lisinopril (PRINIVIL,ZESTRIL) tablet 5 mg, 5 mg, Oral, Daily, Elwin Mocha, MD .   LORazepam (ATIVAN) injection 1-2 mg, 1-2 mg, Intravenous, Q4H PRN, Elwin Mocha, MD .  memantine [facility]) tablet 10 mg, 10 mg, Oral, BID, Elwin Mocha, MD, 10 mg at 01/25/18 2151 .  ondansetron (ZOFRAN) tablet 4 mg, 4 mg, Oral, Q6H PRN **OR** ondansetron (ZOFRAN) injection 4 mg, 4 mg, Intravenous, Q6H PRN, Elwin Mocha, MD .  pantoprazole (PROTONIX) EC tablet 40 mg, 40 mg, Oral, Daily, Elwin Mocha, MD:  . carbidopa-levodopa  0.5 tablet Oral TID  . dorzolamide  1 drop Both Eyes BID  . doxazosin  4 mg Oral QHS  . feeding supplement (ENSURE ENLIVE)  237 mL Oral BID BM  . latanoprost  1 drop Both Eyes QHS  . lisinopril  5 mg Oral Daily  . memantine  10 mg Oral BID  . pantoprazole  40 mg Oral Daily  :  No Known Allergies:  Family History  Problem Relation Age of Onset  . COPD Mother   . Alcohol abuse Father   :  Social History   Socioeconomic History  . Marital status: Married    Spouse name: Not on file  . Number of children: 3  . Years of education: Not on file  . Highest education level: Not on file  Social Needs  . Financial resource strain: Not on file  . Food insecurity - worry: Not on file  . Food insecurity - inability: Not on file  . Transportation needs - medical: Not on file  . Transportation needs - non-medical: Not on file  Occupational History  . Occupation: Retired    Fish farm manager: RETIRED  Tobacco Use  . Smoking status: Former Smoker    Last attempt to quit: 11/19/1968    Years since quitting: 49.2  . Smokeless tobacco: Never Used  . Tobacco comment: never used tobacco  Substance and Sexual Activity  . Alcohol use: Yes    Alcohol/week: 4.2 oz    Types: 7 Standard drinks or equivalent per week    Comment: Wine/Beer  . Drug use: No  . Sexual activity: Not on file  Other Topics Concern  . Not on file  Social History Narrative   Married (wife patient of Dr. Yong Channel as well)      Retired from Armed forces logistics/support/administrative officer and [place]      Hobbies:  Scientist, research (physical sciences) [employer], [employer] .  :  Pertinent items are noted in HPI.  Exam: Patient Vitals for the past 24 hrs:  BP Temp Temp src Pulse Resp SpO2 Height Weight  01/26/18 0740 - 98.1 F (36.7 C) Oral - - - - -  01/26/18 0600 (!) 139/31 - - (!) 53 12 98 % - -  01/26/18 0500 (!) 113/44 - - (!) 58 11 98 % - -  01/26/18 0400 (!) 107/36 - - (!) 53 13 98 % - -  01/26/18 0353 - 98.3 F (36.8 C) Oral - - - - 144 lb 2.9 oz (65.4 kg)  01/26/18 0300 - - - (!) 52 15 97 % - -  01/26/18 0100 (!) 108/34 - - (!) 52 10 98 % - -  01/26/18 0014 - 98.4 F (36.9 C) Axillary - - - - -  01/26/18 0000 (!) 121/53 - - (!) 55 11 96 % - -  01/25/18 2300 (!) 115/39 - - 62 11 99 % - -  01/25/18 2200 (!) 164/49 - - 67 13 99 % - -  01/25/18 2125 (!) 194/57 - - - 13 - - -  01/25/18 2118 - (!) 97.5 F (36.4 C) Oral - 12 - 6' (1.829 m) 144 lb 2.9 oz (65.4 kg)  01/25/18 2049 (!) 166/65 - - (!) 59 11 98 % - -  01/25/18 2045 (!) 166/65 - - 61 10 97 % - -  01/25/18 2030 136/72 - - (!) 56 10 98 % - -  01/25/18 2015 134/60 - - (!) 56 12 99 % - -  01/25/18 2000 136/68 - - (!) 58 16 97 % - -  01/25/18 1930 (!) 150/64 - - 65 11 98 % - -  01/25/18 1915 (!) 129/59 - - (!) 59 11 98 % - -  01/25/18 1900 (!) 125/58 - - 60 11 98 % - -  01/25/18 1845 (!) 128/57 - - (!) 57 11 100 % - -  01/25/18 1830 (!) 135/56 - - (!) 58 11 99 % - -  01/25/18 1815 (!) 123/54 - - (!) 58 12 100 % - -  01/25/18 1800 129/60 (!) 97.4 F (36.3 C) Oral (!) 59 14 99 % - -  01/25/18 1745 (!) 134/54 - - (!) 57 12 99 % - -  01/25/18 1730 (!) 112/52 - - (!) 56 12 100 % - -  01/25/18 1715 (!) 115/48 - - (!) 56 12 100 % - -  01/25/18 1700 (!) 110/49 - - (!) 55 11 100 % - -  01/25/18 1645 (!) 111/45 - - (!) 54 14 100 % - -  01/25/18 1615 (!) 101/49 - - (!) 54 12 100 % - -  01/25/18 1600 (!) 95/42 - - (!) 54 15 100 % - -  01/25/18 1545 (!) 105/46 - - (!) 57 14 100 % - -  01/25/18 1530 (!) 94/39 - - (!) 57 11 100 % - -  01/25/18 1510 (!)  95/41 98 F (36.7 C) Oral (!) 58 16 100 % - -  01/25/18 1500 (!) 93/41 - - (!) 55 12 100 % - -  01/25/18 1450 (!) 97/42 (!) 97.5 F (36.4 C) Oral (!) 58 11 100 % - -  01/25/18 1430 (!) 87/40 - - (!) 58 16 100 % - -  01/25/18 1415 (!) 92/40 - - 61 11 99 % - -  01/25/18 1400 (!) 97/38 - - (!) 59 11 100 % - -  01/25/18 1315 (!) 101/40 - - (!) 58 12 100 % - -  01/25/18 1300 (!) 106/41 - - (!) 58 18 100 % - -  01/25/18 1245 (!) 100/42 - - (!) 56 11 100 % - -  01/25/18 1230 (!) 95/37 - - (!) 58 15 100 % - -  01/25/18 1216 (!) 110/44 - - 70 17 99 % - -  01/25/18 1215 (!) 90/40 - - (!) 58 10 100 % - -  01/25/18  1150 - 98 F (36.7 C) Oral - - - - -  01/25/18 1146 (!) 104/47 - - 63 11 100 % - -  01/25/18 1140 (!) 104/47 - - 64 (!) 22 100 % - -     Recent Labs    01/25/18 1302 01/25/18 2232 01/26/18 0340  WBC 4.7  --  4.3  HGB 6.6* 8.9* 8.4*  HCT 18.8* 24.5* 23.6*  PLT 335  --  378   Recent Labs    01/25/18 1206 01/26/18 0340  NA 119* 124*  K 4.8 4.1  CL 91* 97*  CO2 22 21*  GLUCOSE 105* 90  BUN 11 13  CREATININE 0.57* 0.47*  CALCIUM 8.0* 7.9*    Blood smear review: None  Pathology: None    Assessment and Plan: [name] is an 84 year old white male.  He has bad dementia.  He really has very little interactions because of the dementia.  I have seen him for over 10 years.  When I saw him this morning, he did not know who I was.  He has been admitted several times this year already.  I am going to talk to his daughter.  I am not sure what we are really accomplishing by transfusing him now.  These transfusions do not seem to be holding all that long.  I think his body probably is becoming aloe immunized to transfusions.  I know that he cannot be taken care of at home.  I would not think that they would want him put in a nursing home.  I know hospice had been seeing him.  I am not sure if there is still seeing him because of his need for transfusions.  From my point  of view, I would think that [facility] would be a great idea for him.  This would be end-of-life care.  Again, I will have to talk to his daughter about this.  His wife is going to have to agree.  I just hate to see Mr. Ricketson like this.  He has no quality of life.  To me, he is just existing.  Lattie Haw, MD  2 Timothy 4:16

## 2018-01-26 NOTE — Progress Notes (Signed)
PROGRESS NOTE  Alexander Rice QAS:[id number] DOB: [birth year] DOA: 01/25/2018 PCP: Marin Olp, MD  HPI/Recap of past 24 hours:  Alert, denies pain, Repeatedly ask " what time is it" after informed about the time Pleasantly confused   Assessment/Plan: Principal Problem:   Acute blood loss anemia Active Problems:   Hyperlipidemia   Essential hypertension   BPH (benign prostatic hyperplasia)   MDS (myelodysplastic syndrome) ([facility])   Glaucoma   Severe dementia   Dementia without behavioral disturbance  Refractory anemia, likely from MDS -No sign of bleed loss -He received 2 units PRBC, hemoglobin appropriately increased from 6.6-8.4 -Hematology oncology Dr. Marin Olp input appreciated, per Dr. Golda Acre patient may not benefit from re-.  Blood transfusion.  Patient likely now  alloimmunized to transfusion.  Per Dr. Marin Olp patient has failed response to Procrit in the setting of MDS.  Recurrent hyponatremia Likely from poor oral intake Sodium of 119 on presentation, improved to 124 after normal saline.  HTN: BP low normal Hold Norvasc, decrease lisinopril  Progressive dementia, Parkinson's disease/failure to thrive, poor oral intake, weight loss, malnutrition, bedbound,  Pressure injury (bilateral heal, sacral)since 10/2017 Caregiver exhaustion Speech eval, wound care Goals of care discussion. Hematology oncology Dr. Marin Olp has known the patient and family verywell ,he will talk to the daughter and wife. I have discussed with daughter at bedside on 3/10, I have discussed with her about possible hospice referral, she is going to talk to Dr Marin Olp further.   Code Status: full  Family Communication: patient , daughter at bedside  Disposition Plan: not ready to discharge   Consultants:  hematology /oncology Dr Martha Clan  Procedures:  prbc transfusion  Antibiotics:  none   Objective: BP (!) 139/31   Pulse (!) 53   Temp 98.1 F (36.7 C) (Oral)    Resp 12   Ht 6' (1.829 m)   Wt 65.4 kg (144 lb 2.9 oz)   SpO2 98%   BMI 19.55 kg/m   Intake/Output Summary (Last 24 hours) at 01/26/2018 0805 Last data filed at 01/25/2018 2200 Gross per 24 hour  Intake 817 ml  Output -  Net 817 ml   Filed Weights   01/25/18 2118 01/26/18 0353  Weight: 65.4 kg (144 lb 2.9 oz) 65.4 kg (144 lb 2.9 oz)    Exam: Patient is examined daily including today on 01/26/2018, exams remain the same as of yesterday except that has changed    General:  Chronically ill appearing, pleasantly confused, alert, NAD  Cardiovascular: RRR  Respiratory: CTABL  Abdomen: Soft/ND/NT, positive BS  Musculoskeletal: No Edema, not able to lift against gravity, heal protector on  Neuro: alert, only oriented to self  Data Reviewed: Basic Metabolic Panel: Recent Labs  Lab 01/25/18 1206 01/26/18 0340  NA 119* 124*  K 4.8 4.1  CL 91* 97*  CO2 22 21*  GLUCOSE 105* 90  BUN 11 13  CREATININE 0.57* 0.47*  CALCIUM 8.0* 7.9*   Liver Function Tests: Recent Labs  Lab 01/25/18 1206  AST 37  ALT 30  ALKPHOS 104  BILITOT 0.9  PROT 5.4*  ALBUMIN 3.1*   No results for input(s): LIPASE, AMYLASE in the last 168 hours. No results for input(s): AMMONIA in the last 168 hours. CBC: Recent Labs  Lab 01/25/18 1302 01/25/18 2232 01/26/18 0340  WBC 4.7  --  4.3  NEUTROABS 2.9  --   --   HGB 6.6* 8.9* 8.4*  HCT 18.8* 24.5* 23.6*  MCV 87.9  --  86.4  PLT 335  --  378   Cardiac Enzymes:   No results for input(s): CKTOTAL, CKMB, CKMBINDEX, TROPONINI in the last 168 hours. BNP (last 3 results) No results for input(s): BNP in the last 8760 hours.  ProBNP (last 3 results) No results for input(s): PROBNP in the last 8760 hours.  CBG: No results for input(s): GLUCAP in the last 168 hours.  Recent Results (from the past 240 hour(s))  MRSA PCR Screening     Status: None   Collection Time: 01/25/18  9:34 PM  Result Value Ref Range Status   MRSA by PCR NEGATIVE  NEGATIVE Final    Comment:        The GeneXpert MRSA Assay (FDA approved for NASAL specimens only), is one component of a comprehensive MRSA colonization surveillance program. It is not intended to diagnose MRSA infection nor to guide or monitor treatment for MRSA infections. Performed at [facility], [place] [address], [place], [place] [address]      Studies: No results found.  Scheduled Meds: . carbidopa-levodopa  0.5 tablet Oral TID  . dorzolamide  1 drop Both Eyes BID  . doxazosin  4 mg Oral QHS  . feeding supplement (ENSURE ENLIVE)  237 mL Oral BID BM  . latanoprost  1 drop Both Eyes QHS  . lisinopril  5 mg Oral Daily  . memantine  10 mg Oral BID  . pantoprazole  40 mg Oral Daily    Continuous Infusions: . sodium chloride    . sodium chloride       Time spent: 33mins I have personally reviewed and interpreted on  01/26/2018 daily labs, tele strips, imagings as discussed above under date review session and assessment and plans.  I reviewed all nursing notes, pharmacy notes, consultant notes,  vitals, pertinent old records  I have discussed plan of care as described above with RN , patient and family on 01/26/2018   Florencia Reasons MD, PhD  [facility] Pager [phone]. If 7PM-7AM, please contact night-coverage at [email or web address], password [facility] 01/26/2018, 8:05 AM  LOS: 1 day

## 2018-01-27 DIAGNOSIS — D469 Myelodysplastic syndrome, unspecified: Secondary | ICD-10-CM

## 2018-01-27 DIAGNOSIS — F0390 Unspecified dementia without behavioral disturbance: Secondary | ICD-10-CM

## 2018-01-27 DIAGNOSIS — D62 Acute posthemorrhagic anemia: Principal | ICD-10-CM

## 2018-01-27 DIAGNOSIS — Z515 Encounter for palliative care: Secondary | ICD-10-CM

## 2018-01-27 DIAGNOSIS — E871 Hypo-osmolality and hyponatremia: Secondary | ICD-10-CM

## 2018-01-27 LAB — CBC
HCT: 24.5 % — ABNORMAL LOW (ref 39.0–52.0)
Hemoglobin: 8.7 g/dL — ABNORMAL LOW (ref 13.0–17.0)
MCH: 31.1 pg (ref 26.0–34.0)
MCHC: 35.5 g/dL (ref 30.0–36.0)
MCV: 87.5 fL (ref 78.0–100.0)
Platelets: 417 10*3/uL — ABNORMAL HIGH (ref 150–400)
RBC: 2.8 MIL/uL — ABNORMAL LOW (ref 4.22–5.81)
RDW: 18.3 % — AB (ref 11.5–15.5)
WBC: 4.8 10*3/uL (ref 4.0–10.5)

## 2018-01-27 LAB — BASIC METABOLIC PANEL
ANION GAP: 5 (ref 5–15)
BUN: 16 mg/dL (ref 6–20)
CALCIUM: 8.2 mg/dL — AB (ref 8.9–10.3)
CO2: 24 mmol/L (ref 22–32)
Chloride: 97 mmol/L — ABNORMAL LOW (ref 101–111)
Creatinine, Ser: 0.57 mg/dL — ABNORMAL LOW (ref 0.61–1.24)
GFR calc Af Amer: >60 mL/min (ref >60)
GFR calc non Af Amer: >60 mL/min (ref >60)
Glucose, Bld: 109 mg/dL — ABNORMAL HIGH (ref 65–99)
Potassium: 4.4 mmol/L (ref 3.5–5.1)
Sodium: 126 mmol/L — ABNORMAL LOW (ref 135–145)

## 2018-01-27 LAB — TYPE AND SCREEN
ABO/RH(D): A POS
ANTIBODY SCREEN: NEGATIVE
UNIT DIVISION: 0
UNIT DIVISION: 0

## 2018-01-27 LAB — BPAM RBC
BLOOD PRODUCT EXPIRATION DATE: 201903282359
Blood Product Expiration Date: 201903302359
ISSUE DATE / TIME: 201903091443
ISSUE DATE / TIME: 201903091751
UNIT TYPE AND RH: 6200
Unit Type and Rh: 6200

## 2018-01-27 LAB — MAGNESIUM: Magnesium: 1.8 mg/dL (ref 1.7–2.4)

## 2018-01-27 MED ORDER — LORAZEPAM 2 MG/ML IJ SOLN: 1.0000 mg | INTRAMUSCULAR | Status: DC | PRN

## 2018-01-27 MED ORDER — MORPHINE SULFATE (CONCENTRATE) 10 MG/0.5ML PO SOLN: 5.0000 mg | ORAL | Status: DC | PRN

## 2018-01-27 NOTE — Evaluation (Signed)
Clinical/Bedside Swallow Evaluation Patient Details  Name: Alexander Rice MRN: [id number] Date of Birth: 12-30-1931  Today's Date: 01/27/2018 Time: SLP Start Time (ACUTE ONLY): 1149 SLP Stop Time (ACUTE ONLY): 1210 SLP Time Calculation (min) (ACUTE ONLY): 21 min  Past Medical History:  Past Medical History:  Diagnosis Date  . Adenomatous colon polyp 02/1996, 02/2011   TA polyp 02/2011  . Anemia in chronic renal disease 11/02/2011  . B12 deficiency   . BPH (benign prostatic hyperplasia)   . CAD (coronary artery disease)   . CVD (cardiovascular disease)   . Dementia   . Diverticulosis   . Esophageal stricture   . GERD (gastroesophageal reflux disease)   . Glaucoma   . Heart palpitations   . Hemorrhoids   . Hiatal hernia   . History of colonic polyps 09/29/2006   Polyps age 70, Dr. Fuller Plan stated no further colonoscopy due to age. Confirmed with daughter given alzheimers, CAD history    . Hyperlipidemia   . Hypertension   . MDS (myelodysplastic syndrome) ([place])    Past Surgical History:  Past Surgical History:  Procedure Laterality Date  . CAROTID ENDARTERECTOMY    . CHOLECYSTECTOMY N/A 09/07/2017   Procedure: LAPAROSCOPIC CHOLECYSTECTOMY WITH INTRAOPERATIVE CHOLANGIOGRAM, REPAIR OF CHOLECYSTODUODENAL FISTULA, UPPER ENDOSCOPY;  Surgeon: Alphonsa Overall, MD;  Location: [facility];  Service: General;  Laterality: N/A;  . COLONOSCOPY    . ERCP N/A 09/06/2017   Procedure: ENDOSCOPIC RETROGRADE CHOLANGIOPANCREATOGRAPHY (ERCP);  Surgeon: Milus Banister, MD;  Location: [facility];  Service: Endoscopy;  Laterality: N/A;  . GLAUCOMA SURGERY Bilateral 11/19/12   pt's report  . HIP ARTHROPLASTY Right 11/16/2017   Procedure: ARTHROPLASTY BIPOLAR HIP (HEMIARTHROPLASTY) RIGHT;  Surgeon: Nicholes Stairs, MD;  Location: [facility];  Service: Orthopedics;  Laterality: Right;  . INGUINAL HERNIA REPAIR     right side/ twice  . PTCA     stent  . QUADRICEPS REPAIR     muscle attachment  .  TONSILLECTOMY     HPI:  84 yo male adm to [facility] with acute blood loss.  PMH + for myleodysplasia with recurrent anemia, parkinson's dx, bph, hld, ftt, caregiver exhaustion.  Swallow eval ordered.     Assessment / Plan / Recommendation Clinical Impression  Patient presents with functional oropharyngeal swallow based on clinical swallow evaluation.  No indication of airway compromise with po observed *tea, water, jello, pudding and graham crackers.  No oral residuals nor indication of pharyngeal residuals.  Pt was able to self feed with set up assistance.  Voice remained clear throughout all intake.  Recommend advance diet to regular/thin allowing pt to self feed. NO SLP follow up indicated, thanks for this order.  SLP Visit Diagnosis: Dysphagia, oral phase (R13.11)    Aspiration Risk  Mild aspiration risk    Diet Recommendation Regular;Thin liquid   Liquid Administration via: Cup;Straw Medication Administration: Whole meds with liquid Supervision: Patient able to self feed Compensations: Minimize environmental distractions;Slow rate;Small sips/bites    Other  Recommendations Oral Care Recommendations: Oral care BID   Follow up Recommendations None      Frequency and Duration            Prognosis        Swallow Study   General Date of Onset: 01/27/18 HPI: 84 yo male adm to [facility] with acute blood loss.  PMH + for myleodysplasia with recurrent anemia, parkinson's dx, bph, hld, ftt, caregiver exhaustion.  Swallow eval ordered.   Type of Study: Bedside Swallow  Evaluation Diet Prior to this Study: Thin liquids(full liquids) Temperature Spikes Noted: No Respiratory Status: Room air History of Recent Intubation: No Behavior/Cognition: Alert;Cooperative;Pleasant mood Oral Cavity Assessment: Within Functional Limits(minimal whitish coating on tongue) Oral Care Completed by SLP: Yes Oral Cavity - Dentition: Adequate natural dentition Vision: Functional for self-feeding Self-Feeding  Abilities: Able to feed self Patient Positioning: Upright in bed Baseline Vocal Quality: Normal Volitional Cough: Cognitively unable to elicit Volitional Swallow: Unable to elicit    Oral/Motor/Sensory Function Overall Oral Motor/Sensory Function: Within functional limits   Ice Chips Ice chips: Not tested   Thin Liquid Thin Liquid: Within functional limits Presentation: Straw Other Comments: mild delay noted but no indications of airway compromise    Nectar Thick Nectar Thick Liquid: Not tested   Honey Thick Honey Thick Liquid: Not tested   Puree Puree: Within functional limits Presentation: Self Fed;Spoon   Solid   GO   Solid: Within functional limits Presentation: Self Fredirick Rice 01/27/2018,12:40 PM   [name], [facility] SLP [phone]

## 2018-01-27 NOTE — Progress Notes (Addendum)
 PROGRESS NOTE  Alexander Rice MRN:[id number] DOB: 12/14/1931 DOA: 01/25/2018 PCP: Hunter, Stephen O, MD  HPI/Recap of past 24 hours:  Alert, denies pain, Pleasantly confused  He passed speech eval, though he did not eat much at all.   Assessment/Plan: Principal Problem:   Acute blood loss anemia Active Problems:   Hyperlipidemia   Essential hypertension   BPH (benign prostatic hyperplasia)   MDS (myelodysplastic syndrome) ([facility])   Glaucoma   Severe dementia   Dementia without behavioral disturbance  Refractory anemia, likely from MDS -No sign of bleed loss -He received 2 units PRBC, hemoglobin appropriately increased from 6.6-8.4 -Hematology oncology Dr. Ennever input appreciated, per Dr. Ennever patient may not benefit from further Blood transfusion.  Patient likely now  alloimmunized to transfusion.  Per Dr. Ennever patient has failed response to Procrit in the setting of MDS.  Recurrent hyponatremia Likely from poor oral intake Sodium of 119 on presentation, improved to 126 today , continue hydration, patient does not eat. Goals of care discussion.  HTN: BP low normal Hold Norvasc, decrease lisinopril  Progressive dementia, Parkinson's disease/failure to thrive, poor oral intake, weight loss, malnutrition, bedbound since 10/2017, Pressure injury (bilateral heal, sacral) Caregiver exhaustion  wound care Goals of care discussion. Hematology oncology Dr. Ennever has known the patient and family verywell ,he called daughter and recommended hospice.  I have discussed with daughter at bedside on 3/10 pm, I have discussed with her about possible hospice referral. Family meeting with palliative care today.   5:15pm addendum:  Family met with palliative care, he is now DNR/DNI, d/c to residential hospice.  Code Status: DNR  Family Communication: patient , daughter at bedside  Disposition Plan: residential hospice when bed available.    Consultants:  hematology  /oncology Dr Ennerver  Palliative care  Procedures:  prbc transfusionx2 units  Antibiotics:  none   Objective: BP 137/61 (BP Location: Left Arm)   Pulse 62   Temp 98 F (36.7 C) (Oral)   Resp 16   Ht 6' (1.829 m)   Wt 66 kg (145 lb 8.1 oz)   SpO2 99%   BMI 19.73 kg/m   Intake/Output Summary (Last 24 hours) at 01/27/2018 1423 Last data filed at 01/27/2018 0821 Gross per 24 hour  Intake 634.17 ml  Output 250 ml  Net 384.17 ml   Filed Weights   01/26/18 0353 01/26/18 2230 01/27/18 0522  Weight: 65.4 kg (144 lb 2.9 oz) 65.9 kg (145 lb 4.5 oz) 66 kg (145 lb 8.1 oz)    Exam: Patient is examined daily including today on 01/27/2018, exams remain the same as of yesterday except that has changed    General:  Chronically ill appearing, pleasantly confused, alert, NAD  Cardiovascular: RRR  Respiratory: CTABL  Abdomen: Soft/ND/NT, positive BS  Musculoskeletal: No Edema, not able to lift against gravity, heal protector on  Neuro: alert, only oriented to self  Data Reviewed: Basic Metabolic Panel: Recent Labs  Lab 01/25/18 1206 01/26/18 0340 01/27/18 0459  NA 119* 124* 126*  K 4.8 4.1 4.4  CL 91* 97* 97*  CO2 22 21* 24  GLUCOSE 105* 90 109*  BUN 11 13 16  CREATININE 0.57* 0.47* 0.57*  CALCIUM 8.0* 7.9* 8.2*  MG  --   --  1.8   Liver Function Tests: Recent Labs  Lab 01/25/18 1206  AST 37  ALT 30  ALKPHOS 104  BILITOT 0.9  PROT 5.4*  ALBUMIN 3.1*   No results for input(s): LIPASE, AMYLASE   in the last 168 hours. No results for input(s): AMMONIA in the last 168 hours. CBC: Recent Labs  Lab 01/25/18 1302 01/25/18 2232 01/26/18 0340 01/27/18 0459  WBC 4.7  --  4.3 4.8  NEUTROABS 2.9  --   --   --   HGB 6.6* 8.9* 8.4* 8.7*  HCT 18.8* 24.5* 23.6* 24.5*  MCV 87.9  --  86.4 87.5  PLT 335  --  378 417*   Cardiac Enzymes:   No results for input(s): CKTOTAL, CKMB, CKMBINDEX, TROPONINI in the last 168 hours. BNP (last 3 results) No results for  input(s): BNP in the last 8760 hours.  ProBNP (last 3 results) No results for input(s): PROBNP in the last 8760 hours.  CBG: No results for input(s): GLUCAP in the last 168 hours.  Recent Results (from the past 240 hour(s))  MRSA PCR Screening     Status: None   Collection Time: 01/25/18  9:34 PM  Result Value Ref Range Status   MRSA by PCR NEGATIVE NEGATIVE Final    Comment:        The GeneXpert MRSA Assay (FDA approved for NASAL specimens only), is one component of a comprehensive MRSA colonization surveillance program. It is not intended to diagnose MRSA infection nor to guide or monitor treatment for MRSA infections. Performed at [facility], [place] [address], [place], [place] [address]      Studies: No results found.  Scheduled Meds: . carbidopa-levodopa  0.5 tablet Oral TID  . dorzolamide  1 drop Both Eyes BID  . doxazosin  4 mg Oral QHS  . feeding supplement (ENSURE ENLIVE)  237 mL Oral BID BM  . latanoprost  1 drop Both Eyes QHS  . lisinopril  2.5 mg Oral Daily  . memantine  10 mg Oral BID  . pantoprazole  40 mg Oral Daily    Continuous Infusions: . sodium chloride    . sodium chloride 50 mL/hr at 01/27/18 0031     Time spent: 86mns, case discussed with palliative care I have personally reviewed and interpreted on  01/27/2018 daily labs, tele strips, imagings as discussed above under date review session and assessment and plans.  I reviewed all nursing notes, pharmacy notes, consultant notes,  vitals, pertinent old records  I have discussed plan of care as described above with RN , patient on 01/27/2018   FFlorencia ReasonsMD, PhD  [facility] Pager [phone] If 7PM-7AM, please contact night-coverage at [email or web address], password [email or web address]3/09/2018, 2:23 PM  LOS: 2 days

## 2018-01-27 NOTE — Progress Notes (Signed)
Overall, Alexander Rice really is about the same.  He has the dementia.  He was moved out of the ICU.  He is still a full code.  This really, in my opinion, would not be appropriate.  I will have to talk to his daughter about this.  His hemoglobin is 8.7.  Platelet count 417,000.  Creatinine 0.57.   It is hard to say how much he is eating.  He did not no who I was.  Again, he has a very poor performance status.  His performance status is no better than ECOG 3.  I just am not sure what we are doing by transfusing him at this point.  He really is not affecting his quality of life.  He still has a bad dementia.  I have been trying to get hold of his daughter.  I am not able to get hold of her yesterday.  I will try today.  I would just opt for hospice and comfort care.  I would like to see him go to [facility].  Lattie Haw, MD  Romans 8:38-39

## 2018-01-27 NOTE — Plan of Care (Signed)
  Progressing Clinical Measurements: Respiratory complications will improve 01/27/2018 0356 - Progressing by Ashley Murrain, RN Nutrition: Adequate nutrition will be maintained 01/27/2018 0356 - Progressing by Ashley Murrain, RN

## 2018-01-27 NOTE — Consult Note (Signed)
Consultation Note Date: 01/27/2018    Patient Name: Alexander Rice  DOB: [birth year]  MRN: [id number]  Age / Sex: 84 y.o., male  PCP: Marin Olp, MD Referring Physician: Florencia Reasons, MD  Reason for Consultation: Establishing goals of care and Hospice Evaluation  HPI/Patient Profile: 84 y.o. male  with past medical history of MDS, anemia, chronic hyponatremia, dementia, parkinson's disease, HTN, HLD, GERD, CAD, BPH admitted on 01/25/2018 with fatigue from home. Recent discharge from SNF on 2/19. In ED, patient found to be hyponatremic (119) and anemic (6.6) s/p two units PRBC. Hgb now 8.7. Followed by Dr. Marin Olp for MDS. Patient has been losing response to Procrit and requiring transfusions. Palliative medicine consultation for goals of care/hospice discussion.   Clinical Assessment and Goals of Care: I have reviewed medical records, discussed with care team, and met with daughter Curt Bears) and wife Stanton Kidney) in conference room to discuss diagnosis, prognosis, [place], EOL wishes, disposition and options.  Introduced Palliative Medicine as specialized medical care for people living with serious illness. It focuses on providing relief from the symptoms and stress of a serious illness.  We discussed a brief life review of the patient. Married to wife for 75 years. Three adult children. Curt Bears and Stanton Kidney describe Mr. Mccartin as active including his joy for volunteering and golfing after retirement. Prior to declining health, Mr. And Mrs. Chriscoe traveled and took many cruises. Curt Bears speaks of his ongoing MDS for 10+ years and dementia/parkinson's diagnosis in the last 5 years. They also speak of his declining health since fall/hip fracture/hip replacement in December. He has been unable to walk since. Discharged from SNF on 2/19 back home and has continued to decline functionally, nutritionally, and cognitively. Curt Bears  is documented durable POA.   Discussed hospital diagnoses, interventions, and underlying co-morbidities including dementia/parkinsons and MDS. Discussed disease trajectory of dementia. Curt Bears recalls her conversation with Dr. Erlinda Hong yesterday and voicemail from Dr. Marin Olp today recommending hospice/comfort measures. Curt Bears discussed with her mother, sister, and brother last night and feels confident in their joint decision to focus on comfort measures and transition to hospice services. Stanton Kidney and Curt Bears both speak of him having poor quality of life and not wanting heroic measures in this state of health.   Introduced and completed MOST form with Stanton Kidney and Curt Bears. DNR/DNI, comfort measures, no further antibiotics/IVF/transfusions, and no feeding tube. We reviewed his living will together.   The difference between aggressive medical intervention and comfort care was considered in light of the patient's goals of care.    Educated on transition to comfort measures with focus on comfort, quality, dignity at EOL. Educated on medications as needed for symptom management and to relieve suffering. Also discussed discontinuation of medications/interventions not aimed at comfort. Encouraged comfort feeds if patient will accept--he has only been taking bites and sips for wife and daughter. Discussed prognosis of likely weeks or less with very poor nutritional status and also anemia/hyponatremia.   Educated on hospice options. Family requesting hospice facility placement.   Questions and concerns  were addressed. Therapeutic listening as family shares stories of Mr. Bruun. Emotional support provided.     SUMMARY OF RECOMMENDATIONS    Discussed diagnoses, interventions, guarded prognosis, and disposition options. Wife and daughter have a good understanding and requesting transition to comfort focused care.   MOST form completed. DNR/DNI, comfort measures, no further ABX, IVF, or blood transfusions, and no  feeding tube.   Durable DNR completed and placed in chart.   Symptom management--see below.   Comfort feeds per patient/family request.   SW consult for residential hospice placement.   Code Status/Advance Care Planning:  DNR  Symptom Management:   Ativan 1-76m IV q4h prn anxiety/seizures  Roxanol 523mPO q2h prn pain/dyspnea  Palliative Prophylaxis:   Aspiration, Delirium Protocol, Frequent Pain Assessment, Oral Care and Turn Reposition  Psycho-social/Spiritual:   Desire for further Chaplaincy support: yes  Additional Recommendations: Caregiving  Support/Resources and Education on Hospice  Prognosis:   < 2 weeks secondary to MDS with recurrent anemia requiring blood transfusions, underlying dementia/parkinsons, and severe decline in functional/cognitive/nutritional status. Patient only taking bites/sips. Family requesting transition to comfort measures with no further blood transfusions.   Discharge Planning: Hospice facility      Primary Diagnoses: Present on Admission: . Essential hypertension . MDS (myelodysplastic syndrome) ([facility]. Severe dementia . Hyperlipidemia . BPH (benign prostatic hyperplasia) . Glaucoma . Dementia without behavioral disturbance . Acute blood loss anemia   I have reviewed the medical record, interviewed the patient and family, and examined the patient. The following aspects are pertinent.  Past Medical History:  Diagnosis Date  . Adenomatous colon polyp 02/1996, 02/2011   TA polyp 02/2011  . Anemia in chronic renal disease 11/02/2011  . B12 deficiency   . BPH (benign prostatic hyperplasia)   . CAD (coronary artery disease)   . CVD (cardiovascular disease)   . Dementia   . Diverticulosis   . Esophageal stricture   . GERD (gastroesophageal reflux disease)   . Glaucoma   . Heart palpitations   . Hemorrhoids   . Hiatal hernia   . History of colonic polyps 09/29/2006   Polyps age [age]Dr. StFuller Plantated no further colonoscopy due  to age. Confirmed with daughter given alzheimers, CAD history    . Hyperlipidemia   . Hypertension   . MDS (myelodysplastic syndrome) ([facility]   Social History   Socioeconomic History  . Marital status: Married    Spouse name: None  . Number of children: 3  . Years of education: None  . Highest education level: None  Social Needs  . Financial resource strain: None  . Food insecurity - worry: None  . Food insecurity - inability: None  . Transportation needs - medical: None  . Transportation needs - non-medical: None  Occupational History  . 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 Smoking status: Former Smoker    Last attempt to quit: 11/19/1968    Years since quitting: 49.2  . Smokeless tobacco: Never Used  . Tobacco comment: never used tobacco  Substance and Sexual Activity  . Alcohol use: Yes    Alcohol/week: 4.2 oz    Types: 7 Standard drinks or equivalent per week    Comment: Wine/Beer  . Drug use: No  . Sexual activity: None  Other Topics Concern  . None  Social History Narrative   Married (wife patient of Dr. HuYong Channels well)      Retired from [employer]  Hobbies: activism-[employer], [employer] .   Family History  Problem Relation Age of Onset  . COPD Mother   . Alcohol abuse Father    Scheduled Meds: . carbidopa-levodopa  0.5 tablet Oral TID  . dorzolamide  1 drop Both Eyes BID  . doxazosin  4 mg Oral QHS  . feeding supplement (ENSURE ENLIVE)  237 mL Oral BID BM  . latanoprost  1 drop Both Eyes QHS  . lisinopril  2.5 mg Oral Daily  . memantine  10 mg Oral BID  . pantoprazole  40 mg Oral Daily   Continuous Infusions: . sodium chloride    . sodium chloride 50 mL/hr at 01/27/18 0031   PRN Meds:.acetaminophen **OR** acetaminophen, hydrALAZINE, LORazepam, ondansetron **OR** ondansetron (ZOFRAN) IV Medications Prior to Admission:  Prior to Admission medications   Medication Sig Start Date End Date  Taking? Authorizing Provider  aspirin 81 MG tablet Take 1 tablet (81 mg total) by mouth 2 (two) times daily. 11/19/17  Yes Adhikari Emeline General, MD  carbidopa-levodopa (SINEMET IR) 25-100 MG tablet Take 1/2 tablet three times a day Patient taking differently: Take 0.5 tablets by mouth 3 (three) times daily. Take 1/2 tablet three times a day 07/08/17  Yes Cameron Sprang, MD  [employer] OMEGA-3 KRILL OIL PO Take 350 mg by mouth daily. Reported on 01/16/2016   Yes [provider]  cyanocobalamin 100 MCG tablet Take 100 mcg by mouth daily.    Yes [provider]  dorzolamide (TRUSOPT) 2 % ophthalmic solution Place 1 drop into both eyes 2 (two) times daily.     Yes [provider]  doxazosin (CARDURA) 4 MG tablet TAKE 1 TABLET BY MOUTH AT  BEDTIME 10/01/17  Yes Marin Olp, MD  latanoprost (XALATAN) 0.005 % ophthalmic solution Place 1 drop into both eyes at bedtime.   Yes [provider]  lisinopril (PRINIVIL,ZESTRIL) 5 MG tablet TAKE 1 TABLET BY MOUTH  DAILY 05/14/17  Yes Marin Olp, MD  memantine (NAMENDA) 10 MG tablet TAKE 1 TABLET BY MOUTH TWO  TIMES DAILY 05/01/17  Yes Cameron Sprang, MD  Multiple Vitamin (MULTIVITAMIN) tablet Take 1 tablet by mouth daily.     Yes [provider]  omeprazole (PRILOSEC) 20 MG capsule TAKE 1 CAPSULE BY MOUTH  DAILY 03/26/17  Yes Marin Olp, MD  Probiotic Product ([employer] PROBIOTIC) CHEW Chew 1 tablet by mouth daily.    Yes [provider]  Pyridoxine HCl (VITAMIN B-6) 250 MG tablet Take 1 tablet (250 mg total) by mouth daily. 05/17/15  Yes [name], MD  simvastatin (ZOCOR) 40 MG tablet Hold for 2 to 4 weeks and recheck liver function tests at that time and if stable, can be restarted as per discretion of the PCP. 09/12/17  Yes Hosie Poisson, MD  acetaminophen (TYLENOL) 325 MG tablet Take 650 mg by mouth every 4 (four) hours as needed for mild pain or fever.    [provider]  amLODipine (NORVASC)  5 MG tablet Take 1 tablet (5 mg total) by mouth daily. Patient not taking: Reported on 01/25/2018 11/20/17   Marene Lenz, MD  docusate sodium (COLACE) 100 MG capsule Take 1 capsule (100 mg total) by mouth 2 (two) times daily. Patient not taking: Reported on 01/25/2018 09/12/17   Hosie Poisson, MD  epoetin alfa (PROCRIT) 16109 UNIT/ML injection Inject 1 mL (40,000 Units total) into the skin every 21 ( twenty-one) days. As needed for Hgb < 10g/dl  09/13/17   [place], Holli Humbles, NP  HYDROcodone-acetaminophen (NORCO/VICODIN) 5-325 MG tablet Take 1 tablet by mouth every 6 (six) hours as needed for moderate pain. Patient not taking: Reported on 01/25/2018 12/23/17   Caren Griffins, MD  sodium chloride 1 g tablet Take 1 tablet (1 g total) by mouth 2 (two) times daily with a meal. Patient not taking: Reported on 01/25/2018 11/19/17   Marene Lenz, MD   No Known Allergies Review of Systems  Unable to perform ROS: Dementia   Physical Exam  Constitutional: He is easily aroused. He appears ill.  HENT:  Head: Normocephalic and atraumatic.  Pulmonary/Chest: No accessory muscle usage. No tachypnea. No respiratory distress.  Neurological: He is alert and easily aroused.  Pleasantly confused  Skin: Skin is warm and dry. There is pallor.  Psychiatric: His speech is delayed. Cognition and memory are impaired. He is inattentive.  Nursing note and vitals reviewed.  Vital Signs: BP 137/61 (BP Location: Left Arm)   Pulse 62   Temp 98 F (36.7 C) (Oral)   Resp 16   Ht 6' (1.829 m)   Wt 66 kg (145 lb 8.1 oz)   SpO2 99%   BMI 19.73 kg/m  Pain Assessment: No/denies pain   Pain Score: 0-No pain  SpO2: SpO2: 99 % O2 Device:SpO2: 99 % O2 Flow Rate: .   IO: Intake/output summary:   Intake/Output Summary (Last 24 hours) at 01/27/2018 1411 Last data filed at 01/27/2018 1771 Gross per 24 hour  Intake 634.17 ml  Output 250 ml  Net 384.17 ml    LBM:   Baseline Weight: Weight: 65.4 kg (144 lb 2.9  oz) Most recent weight: Weight: 66 kg (145 lb 8.1 oz)     Palliative Assessment/Data: PPS 30%   Flowsheet Rows     Most Recent Value  Intake Tab  Referral Department  Hospitalist  Unit at Time of Referral  Med/Surg Unit  Palliative Care Primary Diagnosis  Cancer  Palliative Care Type  New Palliative care  Reason for referral  Clarify Goals of Care  Date first seen by Palliative Care  01/27/18  Clinical Assessment  Palliative Performance Scale Score  30%  Psychosocial & Spiritual Assessment  Palliative Care Outcomes  Patient/Family meeting held?  Yes  Who was at the meeting?  daughter, wife  Palliative Care Outcomes  Clarified goals of care, Counseled regarding hospice, Improved pain interventions, Improved non-pain symptom therapy, Provided end of life care assistance, Provided advance care planning, Provided psychosocial or spiritual support, Changed to focus on comfort, Changed CPR status, Completed durable DNR, ACP counseling assistance      Time In/Out: 1350-1410, 1600-1705 Time Total: 38mn Greater than 50%  of this time was spent counseling and coordinating care related to the above assessment and plan.  Signed by:  MIhor Dow FNP-C Palliative Medicine Team  Phone: [phone]: [phone]  Please contact Palliative Medicine Team phone at [phone] questions and concerns.  For individual provider: See AShea Evans

## 2018-01-28 DIAGNOSIS — Z515 Encounter for palliative care: Secondary | ICD-10-CM

## 2018-01-28 NOTE — Consult Note (Signed)
[facility]   Received request from [place] and Dr. Marin Olp for family interest in [facility]. Room available for [name] tomorrow 01/29/18. Completed paper work with daughter this afternoon. Dr. Marin Olp as attending of record per family request.    Please send discharge summary to [phone].  RN please call report to [phone].  Thank you,  Alexander Conte, LCSW [phone]

## 2018-01-28 NOTE — Progress Notes (Signed)
Met with wife and daughter Curt Bears) who is POA. Discussed diagnoses, interventions, prognosis, and disposition options. Completed MOST form with DNR/DNI. SW consulted for residential hospice placement. Full palliative note to follow.  NO CHARGE  Ihor Dow, FNP-C Palliative Medicine Team  Phone: [phone] Fax: [phone]

## 2018-01-28 NOTE — Progress Notes (Signed)
PROGRESS NOTE  Alexander Rice DJT:[id number] DOB: 1932-06-25 DOA: 01/25/2018 PCP: Marin Olp, MD  HPI/Recap of past 24 hours:  Uneventful night, he is pleasantly confused, he does not recognize me He denies pain He does not eat much   Assessment/Plan: Principal Problem:   Acute blood loss anemia Active Problems:   Hyperlipidemia   Essential hypertension   BPH (benign prostatic hyperplasia)   MDS (myelodysplastic syndrome) (HCC)   Glaucoma   Severe dementia   Dementia without behavioral disturbance   Pressure injury of skin   Palliative care by specialist   Terminal care  Refractory anemia, likely from MDS -No sign of bleed loss -He received 2 units PRBC, hemoglobin appropriately increased from 6.6-8.4 -Hematology oncology Dr. Marin Olp input appreciated, per Dr. Marin Olp patient may not benefit from further Blood transfusion.  Patient likely now  alloimmunized to transfusion.  Per Dr. Marin Olp patient has failed response to Procrit in the setting of MDS. -Dr Marin Olp recommended residential hospice. -palliative care met with family on 3/11 "Family requesting transition to comfort measures with no further blood transfusions. " -patient is to discharge to [facility] on 3/13.     Recurrent hyponatremia Likely from poor oral intake Sodium of 119 on presentation, improved to 126 today , continue hydration, patient does not eat. Palliative care consulted, residential hospice placement on 3/13 ( [facility])  HTN: BP low normal Now on comfort measures  Progressive dementia, Parkinson's disease/failure to thrive, poor oral intake, weight loss, malnutrition, bedbound since 10/2017, Pressure injury (bilateral heal, sacral) Caregiver exhaustion Supportive care Full comfort measures, residential hospice placement on 3/13   Code Status: DNR  Family Communication: patient , daughter at bedside  Disposition Plan: residential hospice when bed available.     Consultants:  hematology /oncology Dr Martha Clan  Palliative care  Procedures:  prbc transfusionx2 units on 3/9  Antibiotics:  none   Objective: BP (!) 105/56 (BP Location: Left Arm)   Pulse (!) 59   Temp (!) 97.2 F (36.2 C) (Oral)   Resp 18   Ht 6' (1.829 m)   Wt 66 kg (145 lb 8.1 oz)   SpO2 95%   BMI 19.73 kg/m   Intake/Output Summary (Last 24 hours) at 01/28/2018 1553 Last data filed at 01/28/2018 0900 Gross per 24 hour  Intake 1818.66 ml  Output 525 ml  Net 1293.66 ml   Filed Weights   01/26/18 0353 01/26/18 2230 01/27/18 0522  Weight: 65.4 kg (144 lb 2.9 oz) 65.9 kg (145 lb 4.5 oz) 66 kg (145 lb 8.1 oz)    Exam: Patient is examined daily including today on 01/28/2018, exams remain the same as of yesterday except that has changed    General:  Chronically ill appearing, pale, pleasantly confused, alert, NAD  Cardiovascular: RRR  Respiratory: CTABL  Abdomen: Soft/ND/NT, positive BS  Musculoskeletal: No Edema, not able to lift against gravity, heal protector on  Neuro: alert, only oriented to self  Data Reviewed: Basic Metabolic Panel: Recent Labs  Lab 01/25/18 1206 01/26/18 0340 01/27/18 0459  NA 119* 124* 126*  K 4.8 4.1 4.4  CL 91* 97* 97*  CO2 22 21* 24  GLUCOSE 105* 90 109*  BUN _0 CREATININE 0.57* 0.47* 0.57*  CALCIUM 8.0* 7.9* 8.2*  MG  --   --  1.8   Liver Function Tests: Recent Labs  Lab 01/25/18 1206  AST 37  ALT 30  ALKPHOS 104  BILITOT 0.9  PROT 5.4*  ALBUMIN  3.1*   No results for input(s): LIPASE, AMYLASE in the last 168 hours. No results for input(s): AMMONIA in the last 168 hours. CBC: Recent Labs  Lab 01/25/18 1302 01/25/18 2232 01/26/18 0340 01/27/18 0459  WBC 4.7  --  4.3 4.8  NEUTROABS 2.9  --   --   --   HGB 6.6* 8.9* 8.4* 8.7*  HCT 18.8* 24.5* 23.6* 24.5*  MCV 87.9  --  86.4 87.5  PLT 335  --  378 417*   Cardiac Enzymes:   No results for input(s): CKTOTAL, CKMB, CKMBINDEX, TROPONINI in  the last 168 hours. BNP (last 3 results) No results for input(s): BNP in the last 8760 hours.  ProBNP (last 3 results) No results for input(s): PROBNP in the last 8760 hours.  CBG: No results for input(s): GLUCAP in the last 168 hours.  Recent Results (from the past 240 hour(s))  MRSA PCR Screening     Status: None   Collection Time: 01/25/18  9:34 PM  Result Value Ref Range Status   MRSA by PCR NEGATIVE NEGATIVE Final    Comment:        The GeneXpert MRSA Assay (FDA approved for NASAL specimens only), is one component of a comprehensive MRSA colonization surveillance program. It is not intended to diagnose MRSA infection nor to guide or monitor treatment for MRSA infections. Performed at [facility], [place] [address] [place], [place] [address]      Studies: No results found.  Scheduled Meds: . carbidopa-levodopa  0.5 tablet Oral TID  . dorzolamide  1 drop Both Eyes BID  . doxazosin  4 mg Oral QHS  . latanoprost  1 drop Both Eyes QHS  . pantoprazole  40 mg Oral Daily    Continuous Infusions: . sodium chloride    . sodium chloride 10 mL/hr at 01/27/18 1744     Time spent: 36mns I have personally reviewed and interpreted on  01/28/2018 daily labs, tele strips, imagings as discussed above under date review session and assessment and plans.  I reviewed all nursing notes, pharmacy notes, consultant notes,  vitals, pertinent old records  I have discussed plan of care as described above with RN , patient on 01/28/2018   FFlorencia ReasonsMD, PhD  [facility] Pager [phone] If 7PM-7AM, please contact night-coverage at [email or web address], password [email or web address]3/10/2018, 3:53 PM  LOS: 3 days

## 2018-01-28 NOTE — Consult Note (Signed)
WOC consulted for pressure injuries. Noted patient has been made DNR/DNI. The plan is for potential hospice residential placement.  Discussed wounds with primary nurse. Reports partial thickness skin loss over the sacrum and intact but darkened/purple skin on the heel. They have implemented the appropriate skin care order set interventions using silicone foam to protect these sites.  Continue with this and if acute changes take place with wounds please re-consult.  [place], [place], [place]

## 2018-01-28 NOTE — Progress Notes (Signed)
Ms. Glantz is holding relatively steady.  I spoke to his daughter last night.  We will try to get him over to [facility].  I called [facility].  They will put him on the admission list.  Hopefully, they will be able to get him over there in the next day or so.  We will not do any more transfusions.  There are just not helping his quality of life.  His dementia is just really to the point that he is existing and really has no quality of life now.  His daughter agrees.  He seems to be comfortable.  His hemoglobin is 8.7.  Again, he really did not know who I was.  I have known him for about 12 years.  Hopefully, he will be able to go to [facility] in the next day or so.  I sincerely doubt that his prognosis will be more than 3 or 4 weeks.  I think that without transfusions, his heart would not be able to handle the anemia and this would put a lot of stress on him.  Lattie Haw, MD  2 Timothy 4:16-18

## 2018-01-28 NOTE — Consult Note (Signed)
   [facility] CM Inpatient Consult   01/28/2018  Alexander Rice 1932/10/04 [id number]    Patient screened for potential [facility] Care Management services due to frequent hospitalizations.  Chart reviewed. Noted discharge plan is for residential hospice.  There are no [facility] Care Management needs.    Marthenia Rolling, MSN-Ed, RN,BSN [employer] Liaison [phone]

## 2018-01-28 NOTE — Plan of Care (Signed)
  Progressing Clinical Measurements: Respiratory complications will improve 01/28/2018 0758 - Progressing by Ashley Murrain, RN Elimination: Will not experience complications related to bowel motility 01/28/2018 0758 - Progressing by Ashley Murrain, RN Will not experience complications related to urinary retention 01/28/2018 0758 - Progressing by Ashley Murrain, RN

## 2018-01-28 NOTE — Clinical Social Work Note (Signed)
Clinical Social Work Assessment  Patient Details  Name: Alexander Rice MRN: [id number] Date of Birth: [birth year]  Date of referral:  01/28/18               Reason for consult:  End of Life/Hospice                Permission sought to share information with:    Permission granted to share information::     Name::        Agency::     Relationship::     Contact Information:     Housing/Transportation Living arrangements for the past 2 months:  Apartment Source of Information:  Adult Children(Kathryn Geographical information systems officer) Patient Interpreter Needed:  None Criminal Activity/Legal Involvement Pertinent to Current Situation/Hospitalization:  No - Comment as needed Significant Relationships:  Adult Children, Spouse Lives with:  Adult Children, Spouse Do you feel safe going back to the place where you live?    Need for family participation in patient care:  Yes (Comment)  Care giving concerns:  Patient from home with daughter. Patient admitted with principal problem "Acute blood loss anemia".  Palliative following and residential hospice recommended.   Social Worker assessment / plan:  CSW spoke with patient's daughter/HCPOA Isabel Caprice) regarding residential hospice referral. Patient's daughter confirmed plan for patient to discharge to [facility] for residential hospice care. CSW agreed to make referral.  CSW contacted [facility] hospital liaison Harmon Pier and made referral for [facility]. [facility] liaison reported that there was already a referral for patient and agreed to follow up.  CSW will continue to follow and assist with discharge planning.  Employment status:  Retired Forensic scientist:  Medicare PT Recommendations:  Not assessed at this time [place] / Referral to community resources:  Other (Comment Required)(Residential Hospice)  Patient/Family's Response to care:  Patient's daughter appreciative of CSW assistance with discharge planning.  Patient/Family's Understanding  of and Emotional Response to Diagnosis, Current Treatment, and Prognosis:  Patient oriented x person and unable to participate in assessment. Patient's daughter involved in patient's care and verbalized plan for patient to discharge to residential hospice. CSW agreed to make referral and informed patient's daughter that CSW is available if needed.   Emotional Assessment Appearance:    Attitude/Demeanor/Rapport:  Unable to Assess Affect (typically observed):  Unable to Assess Orientation:  Oriented to Self Alcohol / Substance use:  Not Applicable Psych involvement (Current and /or in the community):  No (Comment)  Discharge Needs  Concerns to be addressed:  Care Coordination Readmission within the last 30 days:  No Current discharge risk:  Terminally ill Barriers to Discharge:  Hospice Bed not available   Burnis Medin, LCSW 01/28/2018, 12:01 PM

## 2018-01-29 DIAGNOSIS — N401 Enlarged prostate with lower urinary tract symptoms: Secondary | ICD-10-CM

## 2018-01-29 DIAGNOSIS — I10 Essential (primary) hypertension: Secondary | ICD-10-CM

## 2018-01-29 DIAGNOSIS — R338 Other retention of urine: Secondary | ICD-10-CM

## 2018-01-29 DIAGNOSIS — D649 Anemia, unspecified: Secondary | ICD-10-CM

## 2018-01-29 MED ORDER — MORPHINE SULFATE (CONCENTRATE) 10 MG/0.5ML PO SOLN: 5.0000 mg | ORAL | 0 refills | Status: AC | PRN

## 2018-01-29 NOTE — Discharge Summary (Signed)
Physician Discharge Summary  Alexander Rice HYW:[id number] DOB: 17-Jun-1932 DOA: 01/25/2018  PCP: Marin Olp, MD  Admit date: 01/25/2018 Discharge date: 01/29/2018  Admitted From: Home Disposition:  Residential Hospice  Recommendations for Outpatient Follow-up:  1. Follow up Care per Hospice Protocol  Home Health: No Equipment/Devices: None   Discharge Condition: Guarded CODE STATUS: DNR; Comfort Care Diet recommendation: Regular Diet  Brief/Interim Summary: Alexander Rice is a 84 y.o. male past history significant for Kidney disease, Heart disease, BPH, hypertension, depression with fatigue. He was brought in by daughter who is the POA due to concerns that his fatigue me that he is anemic again.  Patient recently discharged from skilled nursing facility on February 19. Has chronic issue of low sodium.  Medication was stopped at discharge.  Daughter concerned the patient's sodium may also be low. Patient found to have low Na &  hemoglobin.  Blood transfusion started. TRH was consulted for admission.  Hematology consulted for input and felt patient may not benefit from transfusion and likely alloimmunized to transfusion. Dr. Marin Olp felt patient had failed response to Procrit in the setting of MDS and recommended Hospice. Palliative Care met with family and patient transitioned to Full Comfort Measures with no further blood transfusions and patient being discharged to Residential Hospice Today.   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 Marin Olp input appreciated, per Dr. Marin Olp patient may not benefit from further Blood transfusion.   -Patient likely now  alloimmunized to transfusion.  Per Dr. Marin Olp patient has failed response to Procrit in the setting of MDS. -Dr Marin Olp recommended residential hospice. -Palliative care met with family on 3/11 "Family requesting transition to comfort measures with no further blood transfusions." -Patient is to discharge to [facility] on 3/13.  -C/w Full Comfort Measures including Morphine, Ativan, Acetaminophen,   Recurrent Hyponatremia -Likely from poor oral intake -Sodium of 119 on presentation, improved to 126  -Continue hydration, patient does not eat. -Palliative care consulted, residential hospice placement on 3/13 ( [facility])  HTN  -BP low normal -C/w Doxazosin 4 mg po Daily qhS -C/w Hydralazine 10 mg IV q8hprn for SBP>180 or DBP >100 -Now on comfort measures  Progressive dementia, Parkinson's disease/failure to thrive, poor oral intake, weight loss, malnutrition, bedbound since 10/2017, Pressure injury (bilateral heal, sacral) Caregiver exhaustion -Supportive care -C/w Carbidopa-Levodopa 0.5 mg po TID -Full comfort measures, residential hospice placement on 3/13  Glaucoma -C/w Dorzolaminde 1 drop both eyes BID and Latanoprost 1 drop both Eyes qHS  Discharge Instructions  Discharge Instructions    Diet general   Complete by:  As directed    Increase activity slowly   Complete by:  As directed      Allergies as of 01/29/2018   No Known Allergies     Medication List    STOP taking these medications   amLODipine 5 MG tablet Commonly known as:  NORVASC   aspirin 81 MG tablet   [employer] OMEGA-3 KRILL OIL PO   [employer] PROBIOTIC Chew   epoetin  alfa 40000 UNIT/ML injection Commonly known as:  PROCRIT   lisinopril 5 MG tablet Commonly known as:  PRINIVIL,ZESTRIL   multivitamin tablet   omeprazole  20 MG capsule Commonly known as:  PRILOSEC   simvastatin 40 MG tablet Commonly known as:  ZOCOR   sodium chloride 1 g tablet     TAKE these medications   acetaminophen 325 MG tablet Commonly known as:  TYLENOL Take 650 mg by mouth every 4 (four) hours as needed for mild pain or fever.   carbidopa-levodopa 25-100 MG tablet Commonly known as:  SINEMET IR Take 1/2 tablet three times a day What changed:    how much to take  how to take this  when to take this  additional instructions   cyanocobalamin 100 MCG tablet Take 100 mcg by mouth daily.   docusate sodium 100 MG capsule Commonly known as:  COLACE Take 1 capsule (100 mg total) by mouth 2 (two) times daily.   dorzolamide 2 % ophthalmic solution Commonly known as:  TRUSOPT Place 1 drop into both eyes 2 (two) times daily.   doxazosin 4 MG tablet Commonly known as:  CARDURA TAKE 1 TABLET BY MOUTH AT  BEDTIME   HYDROcodone-acetaminophen 5-325 MG tablet Commonly known as:  NORCO/VICODIN Take 1 tablet by mouth every 6 (six) hours as needed for moderate pain.   latanoprost 0.005 % ophthalmic solution Commonly known as:  XALATAN Place 1 drop into both eyes at bedtime.   memantine 10 MG tablet Commonly known as:  NAMENDA TAKE 1 TABLET BY MOUTH TWO  TIMES DAILY   morphine CONCENTRATE 10 MG/0.5ML Soln concentrated solution Take 0.25 mLs (5 mg total) by mouth every 2 (two) hours as needed for moderate pain, severe pain or shortness of breath.   vitamin B-6 250 MG tablet Take 1 tablet (250 mg total) by mouth daily.       No Known Allergies  Consultations:  Oncology Dr. Marin Olp  Palliative Care Medicine   Procedures/Studies: No results found.   Subjective: Seen and examined and had no complaints. No Pain. Denied any SOB.   Discharge Exam: Vitals:   01/28/18 1700 01/28/18 2102  BP: (!) 114/42 (!) 106/44  Pulse: (!) 59 60  Resp: 18   Temp: (!) 97.5 F (36.4 C) 98.3 F (36.8 C)  SpO2: 95% 98%    Vitals:   01/27/18 2155 01/28/18 0435 01/28/18 1700 01/28/18 2102  BP: (!) 116/49 (!) 105/56 (!) 114/42 (!) 106/44  Pulse: (!) 59 (!) 59 (!) 59 60  Resp: 17 18 18    Temp: 98 F (36.7 C) (!) 97.2 F (36.2 C) (!) 97.5 F (36.4 C) 98.3 F (36.8 C)  TempSrc: Oral Oral Oral Oral  SpO2: 98% 95% 95% 98%  Weight:      Height:       General: Pt is awake, not in acute distress Cardiovascular: RRR, S1/S2 +, no rubs, no gallops Respiratory: Diminished bilaterally, no wheezing, no rhonchi Abdominal: Soft, NT, ND, bowel sounds + Extremities: Trace LE edema, no cyanosis  The results of significant diagnostics from this hospitalization (including imaging, microbiology, ancillary and laboratory) are listed below for reference.    Microbiology: Recent Results (from the past 240 hour(s))  MRSA PCR Screening     Status: None   Collection Time: 01/25/18  9:34 PM  Result Value Ref Range Status   MRSA by PCR NEGATIVE NEGATIVE Final    Comment:        The GeneXpert MRSA Assay (FDA  approved for NASAL specimens only), is one component of a comprehensive MRSA colonization surveillance program. It is not intended to diagnose MRSA infection nor to guide or monitor treatment for MRSA infections. Performed at [facility], [place] [address], [place], [place] [address]     Labs: BNP (last 3 results) No results for input(s): BNP in the last 8760 hours. Basic Metabolic Panel: Recent Labs  Lab 01/25/18 1206 01/26/18 0340 01/27/18 0459  NA 119* 124* 126*  K 4.8 4.1 4.4  CL 91* 97* 97*  CO2 22 21* 24  GLUCOSE 105* 90 109*  BUN 11 13 16   CREATININE 0.57* 0.47* 0.57*  CALCIUM 8.0* 7.9* 8.2*  MG  --   --  1.8   Liver Function Tests: Recent Labs  Lab 01/25/18 1206  AST 37  ALT 30  ALKPHOS 104  BILITOT 0.9  PROT 5.4*  ALBUMIN 3.1*   No results for input(s): LIPASE, AMYLASE in the last 168 hours. No results for input(s): AMMONIA in the last 168 hours. CBC: Recent  Labs  Lab 01/25/18 1302 01/25/18 2232 01/26/18 0340 01/27/18 0459  WBC 4.7  --  4.3 4.8  NEUTROABS 2.9  --   --   --   HGB 6.6* 8.9* 8.4* 8.7*  HCT 18.8* 24.5* 23.6* 24.5*  MCV 87.9  --  86.4 87.5  PLT 335  --  378 417*   Cardiac Enzymes: No results for input(s): CKTOTAL, CKMB, CKMBINDEX, TROPONINI in the last 168 hours. BNP: Invalid input(s): POCBNP CBG: No results for input(s): GLUCAP in the last 168 hours. D-Dimer No results for input(s): DDIMER in the last 72 hours. Hgb A1c No results for input(s): HGBA1C in the last 72 hours. Lipid Profile No results for input(s): CHOL, HDL, LDLCALC, TRIG, CHOLHDL, LDLDIRECT in the last 72 hours. Thyroid function studies No results for input(s): TSH, T4TOTAL, T3FREE, THYROIDAB in the last 72 hours.  Invalid input(s): FREET3 Anemia work up No results for input(s): VITAMINB12, FOLATE, FERRITIN, TIBC, IRON, RETICCTPCT in the last 72 hours. Urinalysis    Component Value Date/Time   COLORURINE YELLOW 11/13/2017 0240   APPEARANCEUR CLEAR 11/13/2017 0240   LABSPEC 1.015 11/13/2017 0240   PHURINE 6.0 11/13/2017 0240   GLUCOSEU NEGATIVE 11/13/2017 0240   HGBUR NEGATIVE 11/13/2017 0240   HGBUR negative 10/11/2009 0819   BILIRUBINUR NEGATIVE 11/13/2017 0240   BILIRUBINUR n 04/18/2016 1152   KETONESUR 5 (A) 11/13/2017 0240   PROTEINUR NEGATIVE 11/13/2017 0240   UROBILINOGEN 1.0 04/18/2016 1152   UROBILINOGEN 0.2 10/11/2009 0819   NITRITE NEGATIVE 11/13/2017 0240   LEUKOCYTESUR NEGATIVE 11/13/2017 0240   Sepsis Labs Invalid input(s): PROCALCITONIN,  WBC,  LACTICIDVEN Microbiology Recent Results (from the past 240 hour(s))  MRSA PCR Screening     Status: None   Collection Time: 01/25/18  9:34 PM  Result Value Ref Range Status   MRSA by PCR NEGATIVE NEGATIVE Final    Comment:        The GeneXpert MRSA Assay (FDA approved for NASAL specimens only), is one component of a comprehensive MRSA colonization surveillance program. It is  not intended to diagnose MRSA infection nor to guide or monitor treatment for MRSA infections. Performed at [facility], [place] [address] [place], [place] [address]    Time coordinating discharge: 35 minutes  SIGNED:  Kerney Elbe, DO [facility] 01/29/2018, 7:44 AM Pager [phone]  If 7PM-7AM, please contact night-coverage [email or web address] Password [email or web address]

## 2018-01-29 NOTE — Plan of Care (Signed)
  Adequate for Discharge Education: Knowledge of General Education information will improve 01/29/2018 1204 - Adequate for Discharge by Merrin Mcvicker, Scarlett Presto, RN Health Behavior/Discharge Planning: Ability to manage health-related needs will improve 01/29/2018 1204 - Adequate for Discharge by Jamarius Saha, Scarlett Presto, RN Clinical Measurements: Ability to maintain clinical measurements within normal limits will improve 01/29/2018 1204 - Adequate for Discharge by Deland Slocumb, Scarlett Presto, RN Will remain free from infection 01/29/2018 1204 - Adequate for Discharge by Asal Teas, Scarlett Presto, RN Diagnostic test results will improve 01/29/2018 1204 - Adequate for Discharge by Lyndsay Talamante, Scarlett Presto, RN Respiratory complications will improve 01/29/2018 1204 - Adequate for Discharge by Wang [place], Scarlett Presto, RN Cardiovascular complication will be avoided 01/29/2018 1204 - Adequate for Discharge by Lacresha Fusilier, Scarlett Presto, RN Activity: Risk for activity intolerance will decrease 01/29/2018 1204 - Adequate for Discharge by Treshaun Carrico, Scarlett Presto, RN Nutrition: Adequate nutrition will be maintained 01/29/2018 1204 - Adequate for Discharge by Laydon Martis, Scarlett Presto, RN Coping: Level of anxiety will decrease 01/29/2018 1204 - Adequate for Discharge by Valari Taylor, Scarlett Presto, RN Elimination: Will not experience complications related to bowel motility 01/29/2018 1204 - Adequate for Discharge by Lekita Kerekes, Scarlett Presto, RN Will not experience complications related to urinary retention 01/29/2018 1204 - Adequate for Discharge by Chrisie Jankovich, Scarlett Presto, RN Pain Managment: General experience of comfort will improve 01/29/2018 1204 - Adequate for Discharge by Julyanna Scholle, Scarlett Presto, RN Safety: Ability to remain free from injury will improve 01/29/2018 1204 - Adequate for Discharge by Clemencia Helzer, Scarlett Presto, RN Skin Integrity: Risk for impaired skin integrity will decrease 01/29/2018 1204 - Adequate for Discharge by Verble Styron, Scarlett Presto, RN

## 2018-01-29 NOTE — Progress Notes (Signed)
Patient discharging to [facility].  CSW faxed patient's discharge summary to [facility].  [facility] contacted, patient's family notified. Patient's RN can call report to [phone], packet complete.  CSW signing off, no other needs identified at this time.  Abundio Miu, [place] Social Worker [facility] Cell#: [phone]

## 2018-01-29 NOTE — Progress Notes (Signed)
Report called to [facility]. Report called to [facility], RN. Discharge packet was given to the transporters, no questions or concerns at this time.

## 2018-01-29 NOTE — Care Management Important Message (Signed)
Important Message  Patient Details  Name: Alexander Rice MRN: [id number] Date of Birth: [birth year]   Medicare Important Message Given:  Yes    Kerin Salen 01/29/2018, 11:47 AMImportant Message  Patient Details  Name: Alexander Rice MRN: [id number] Date of Birth: December 18, 1931   Medicare Important Message Given:  Yes    Kerin Salen 01/29/2018, 11:46 AM

## 2018-02-12 ENCOUNTER — Telehealth: Payer: Self-pay | Admitting: *Deleted

## 2018-02-12 NOTE — Telephone Encounter (Signed)
Received notification from [facility] that patient passed away on 2018-03-06 at 1358pm.  Dr Marin Olp notified.

## 2018-02-17 DEATH — deceased

## 2018-03-19 ENCOUNTER — Ambulatory Visit: Payer: Medicare Other | Admitting: Neurology

## 2019-03-08 IMAGING — MR MR MRCP
7 of 12 series · 20 of 48 positions shown · non-contrast
Comparison: CT abdomen and pelvis 09/04/2017

CLINICAL DATA: Elevated liver function studies. Gas in the
gallbladder on CT. Common duct stones seen on CT. Patient has
dementia and unable to hold breath.

EXAM:
MRI ABDOMEN WITHOUT CONTRAST  (INCLUDING MRCP)
TECHNIQUE: Multiplanar multisequence MR imaging of the abdomen was performed.
Heavily T2-weighted images of the biliary and pancreatic ducts were
obtained, and three-dimensional MRCP images were rendered by post
processing.

[Series 3: T2 fat-sat · axial · 5.0mm · 0.78mm/px · z∈[-83,+167]mm · 2 of 51 slices shown]
[im 1/51]
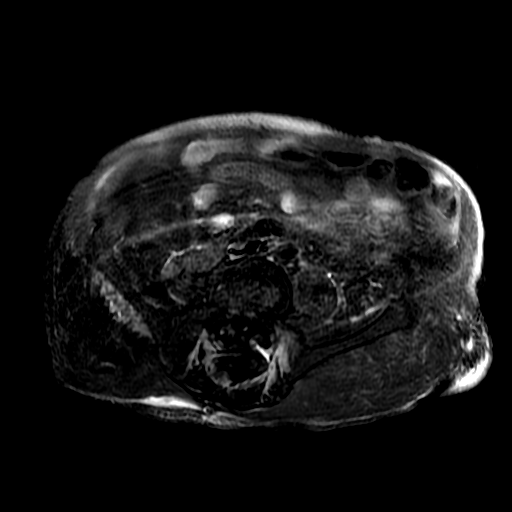
[im 51/51]
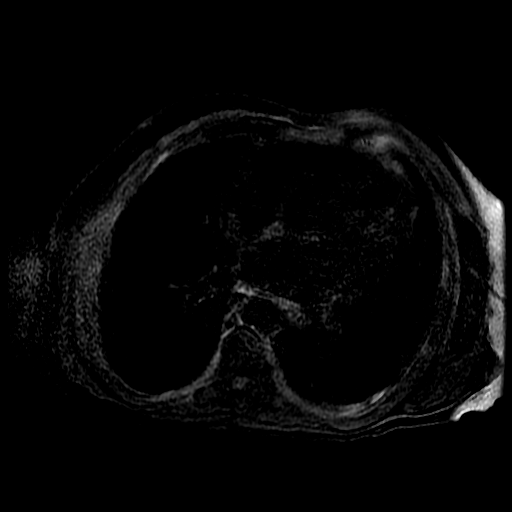

[Series 5: MRCP · coronal · 2.0mm · 0.70mm/px · 3 of 51 slices shown (1 of 2)]
[im 1/51]
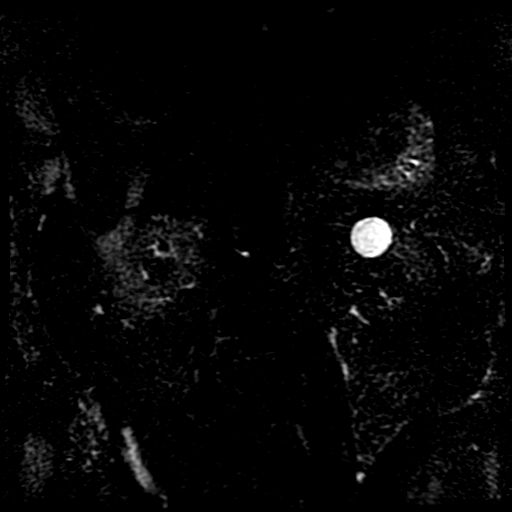
[im 26/51]
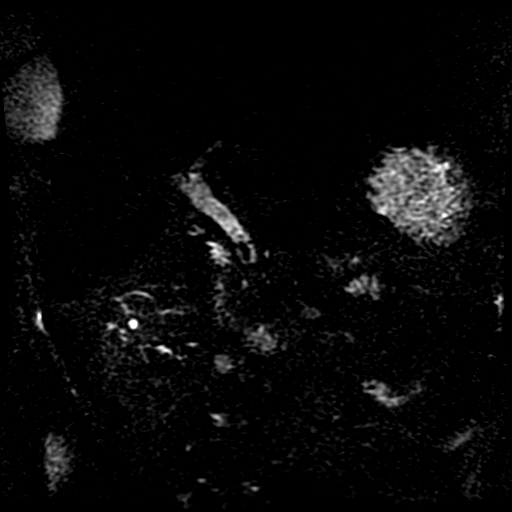
[im 51/51]
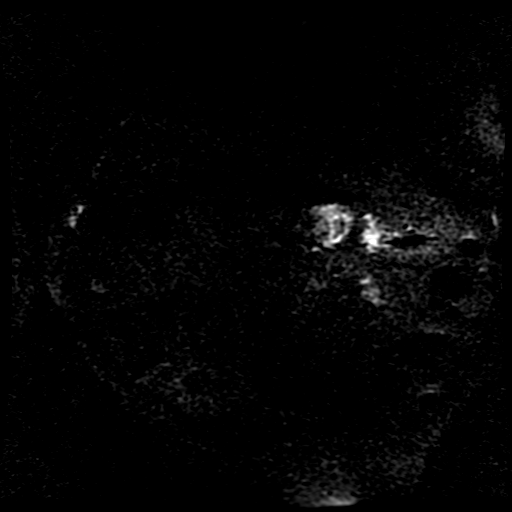

[Series 6: DWI b500 · axial · 6.0mm · 1.48mm/px · z∈[-130,+159]mm · 4 of 76 slices shown]
[im 1/76]
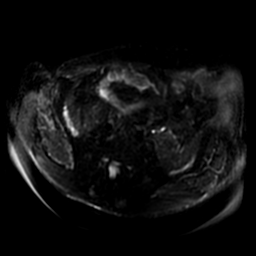
[im 26/76]
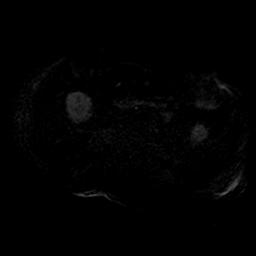
[im 51/76]
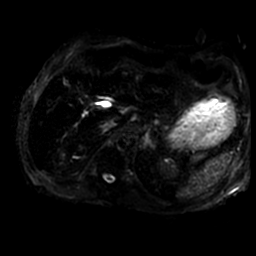
[im 76/76]
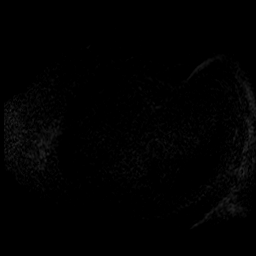

[Series 7: MRCP · coronal · 40.0mm · 0.70mm/px · 1 of 6 slices shown (2 of 2)]
[im 1/6]
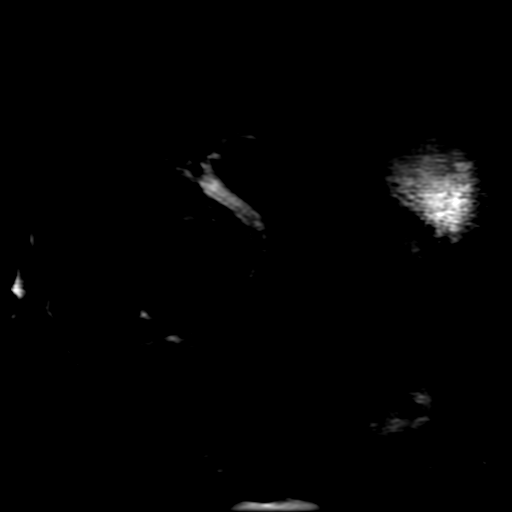

[Series 8: bSSFP fat-sat · coronal · 5.0mm · 0.76mm/px · 3 of 53 slices shown]
[im 1/53]
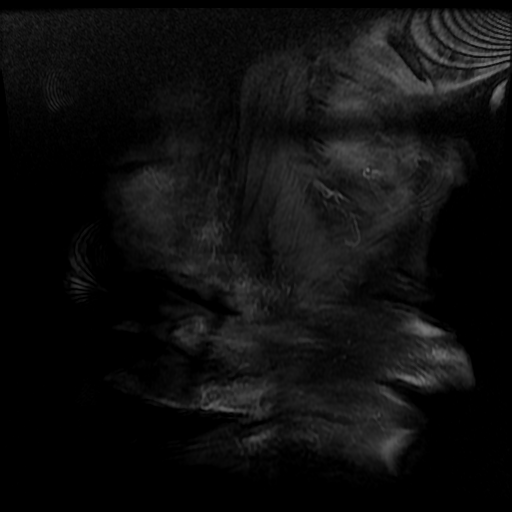
[im 27/53]
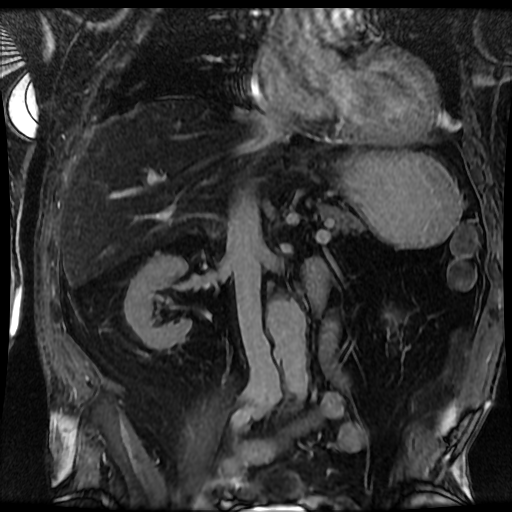
[im 53/53]
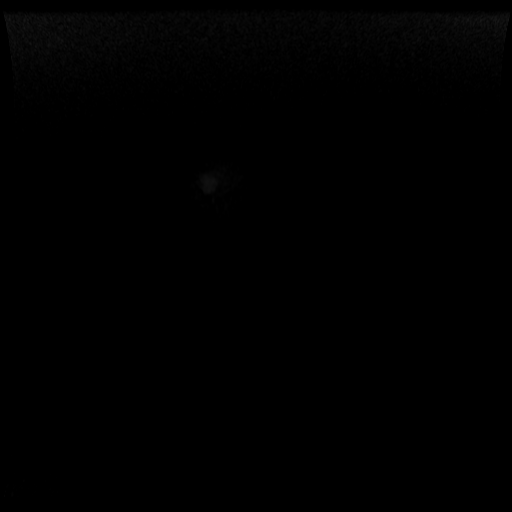

[Series 9: T2 · axial · 5.0mm · 0.78mm/px · z∈[-83,+167]mm · 3 of 51 slices shown]
[im 1/51]
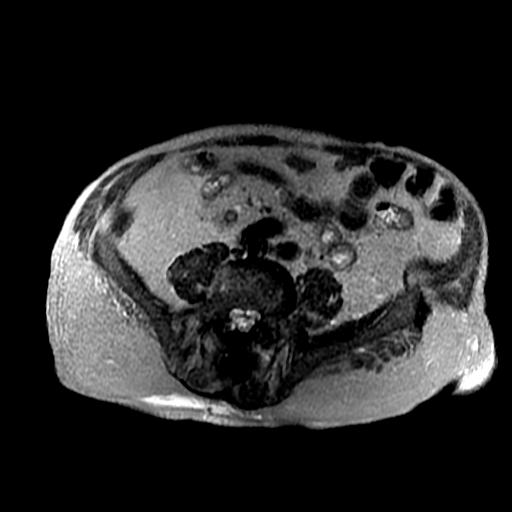
[im 26/51]
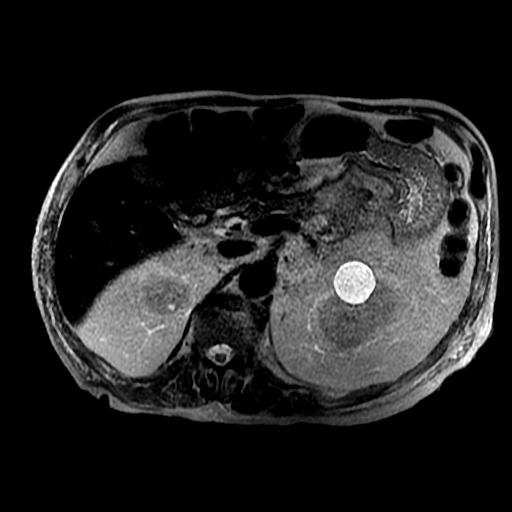
[im 51/51]
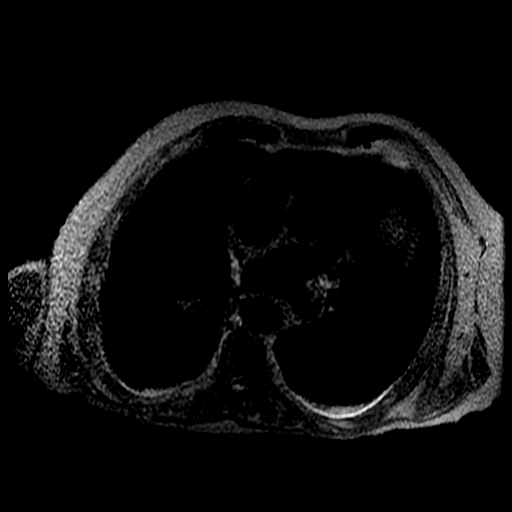

[Series 10: ax dualecho · axial · 5.0mm · 0.78mm/px · z∈[-85,+65]mm · 4 of 102 slices shown]
[im 1/102]
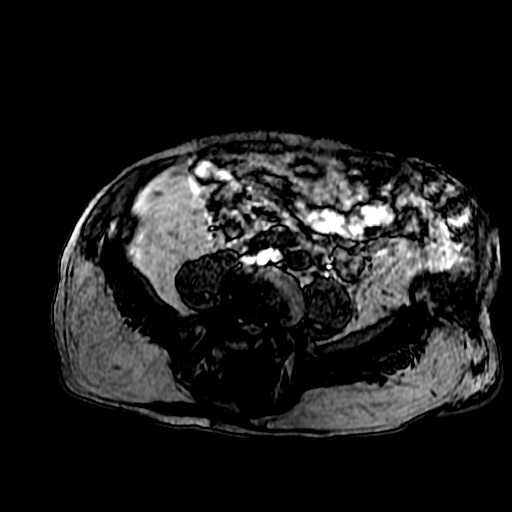
[im 21/102]
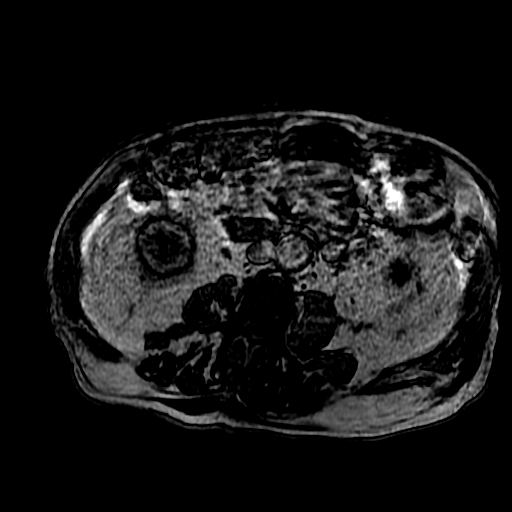
[im 41/102]
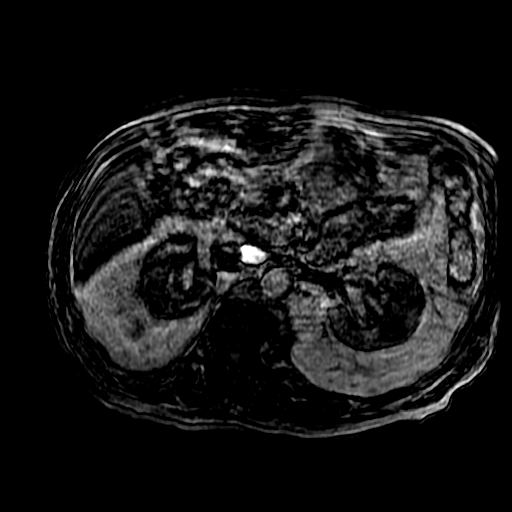
[im 61/102]
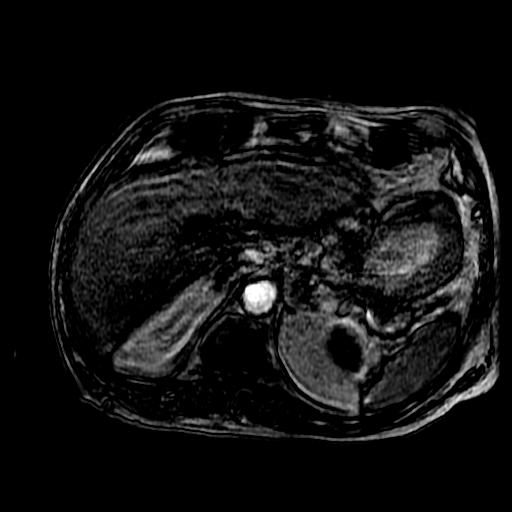

[20 of 48 positions shown; findings below may reference images not displayed]

FINDINGS: Examination is technically limited due to motion artifact.

Lower chest: There appear to be minimal bilateral pleural effusions.

Hepatobiliary: No focal liver lesions are demonstrated. There is
diffuse low signal intensity throughout the liver particularly on T2
weighted imaging. This suggests iron deposition. Apparent signal
loss in the liver on out of phase imaging suggesting diffuse fatty
infiltration as well.

Diffuse intra and extrahepatic bile duct dilatation. Multiple
filling defects seen in the mid and distal common bile duct
consistent with choledocholithiasis. Gallbladder is partially
contracted. Limited visualization of the gallbladder due to
technique but no apparent wall thickening. There is a filling defect
within the gallbladder likely representing a gallstone. The
intraluminal gas seen at CT is not appreciated on MRI.

Pancreas: No focal pancreatic lesion or ductal dilatation
identified.

Spleen: Spleen size is normal. The spleen has a diffusely
heterogeneous appearance with tiny nodular pattern demonstrated
particularly on T2 weighted imaging. This may reflect changes due to
iron overload or may indicate an infectious process. No specific
focal lesions are identified.

Adrenals/Urinary Tract: No adrenal gland nodules. Bilateral renal
cysts. No hydronephrosis or hydroureter.

Stomach/Bowel: Visualized portions within the abdomen are
unremarkable.

Vascular/Lymphatic: Infrarenal aortic ectasia with maximal AP
diameter of about 3.4 cm. No significant lymphadenopathy.

Other: No free intra-abdominal fluid is demonstrated. Mild edema in
the dependent soft tissues.

Musculoskeletal: Degenerative changes and scoliosis of the lumbar
spine. Postoperative changes in the lower lumbar spine. Multiple
areas of central canal stenosis are suggested, most severe at the
L5-S1 level.
IMPRESSION: 1. Cholelithiasis and choledocholithiasis with associated bile duct
dilatation.
2. Diffuse low signal intensity throughout the liver consistent with
iron deposition. Probable diffuse fatty infiltration as well.
3. Heterogeneous appearance of the spleen may be related iron
deposition or could indicate infectious process. No focal lesions.
4. Bilateral renal cysts.
5. Dilated abdominal aorta with maximal AP diameter 3.4 cm.
Recommend followup by ultrasound in 3 years. This recommendation
follows ACR consensus guidelines: White Paper of the ACR Incidental
Findings Committee II on Vascular Findings. [HOSPITAL] 2651;
6. Degenerative changes throughout the lumbar spine with areas of
central canal stenosis, most prominent at the L5-S1 level.

## 2019-03-10 IMAGING — RF DG ERCP WO/W SPHINCTEROTOMY
1 series · 14 of 24 positions shown · non-contrast
Comparison: MRCP - 09/04/2017; CT abdomen pelvis -09/04/2017

FLUOROSCOPY TIME:  7 minutes, 8 seconds

CLINICAL DATA: ERCP with sphincterotomy, balloon occlusion
cholangiogram and CBD stones/sludge removal.

EXAM:
ERCP
TECHNIQUE: Multiple spot images obtained with the fluoroscopic device and
submitted for interpretation post-procedure.

[Series 1: run · 10 acquisitions, 14 frames shown]
[im 1/10]
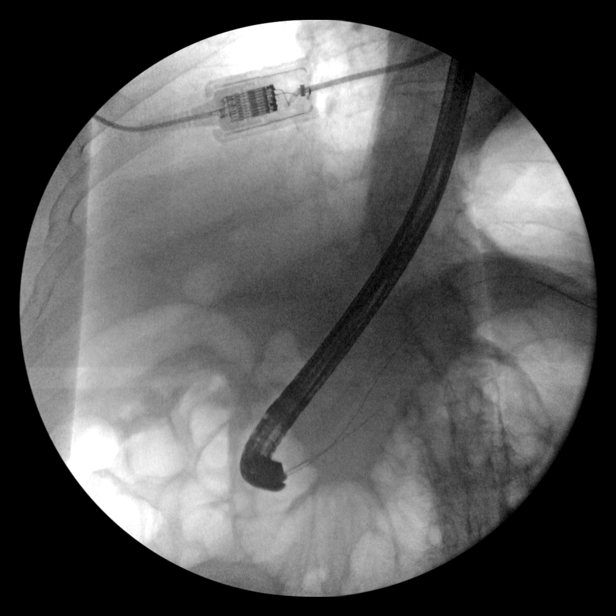
[im 3/10]
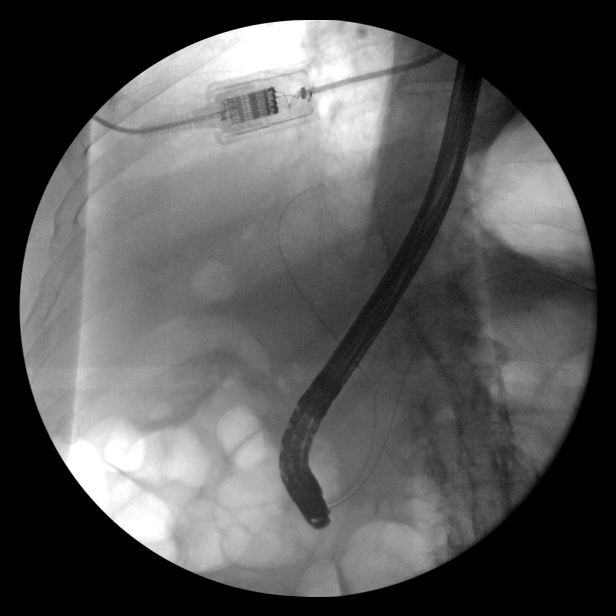
[im 3/10]
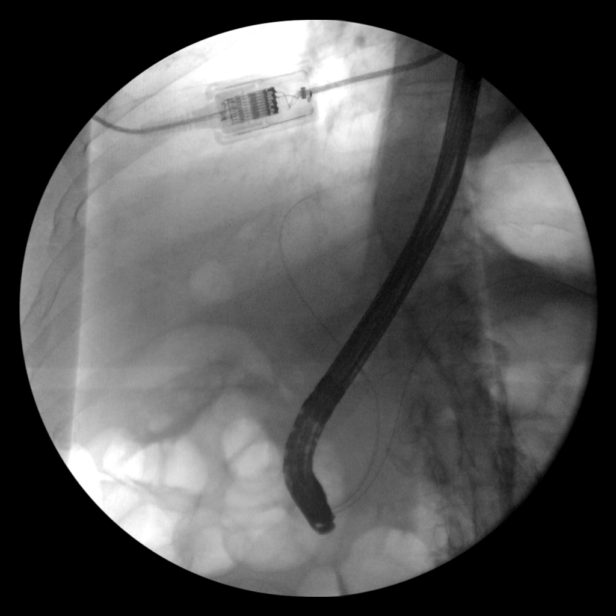
[im 4/10]
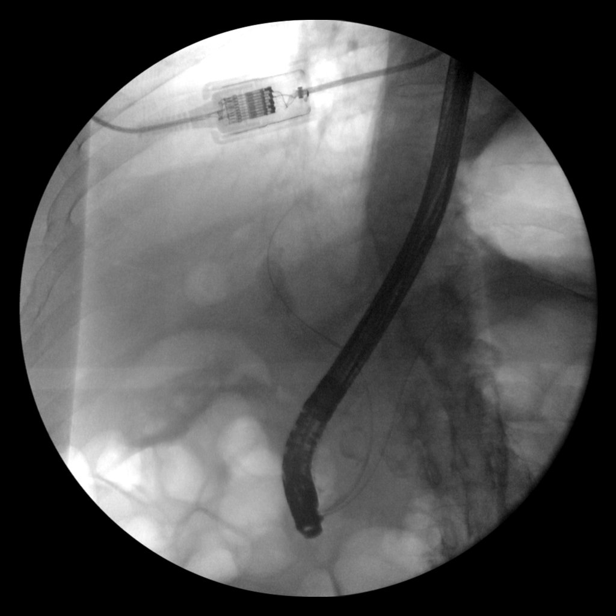
[im 4/10]
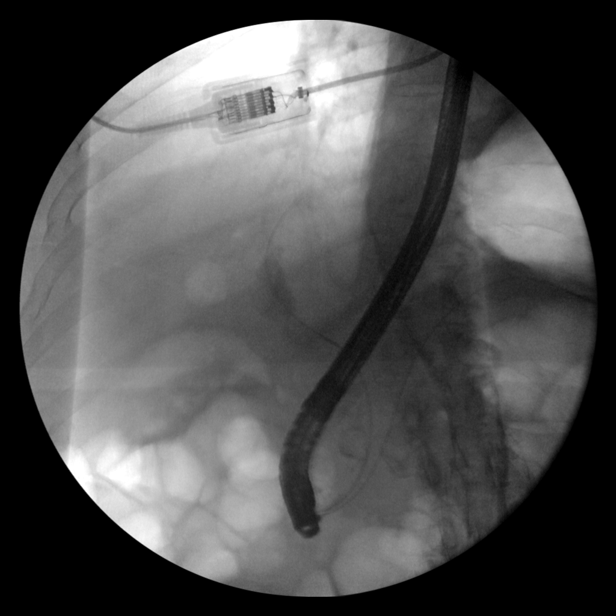
[im 4/10]
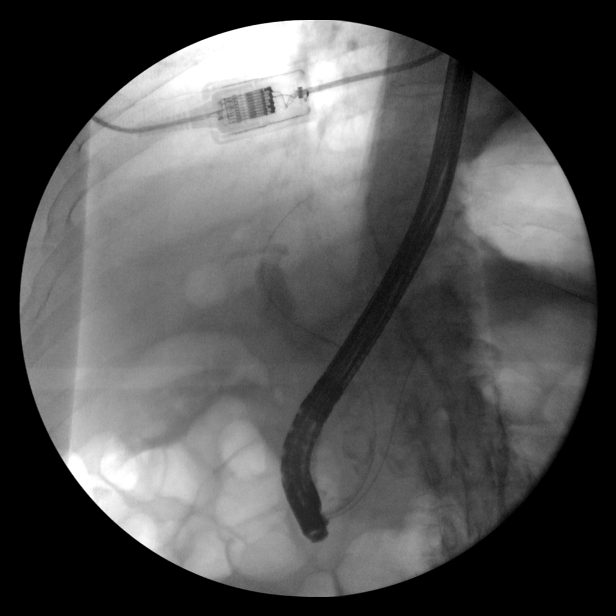
[im 6/10]
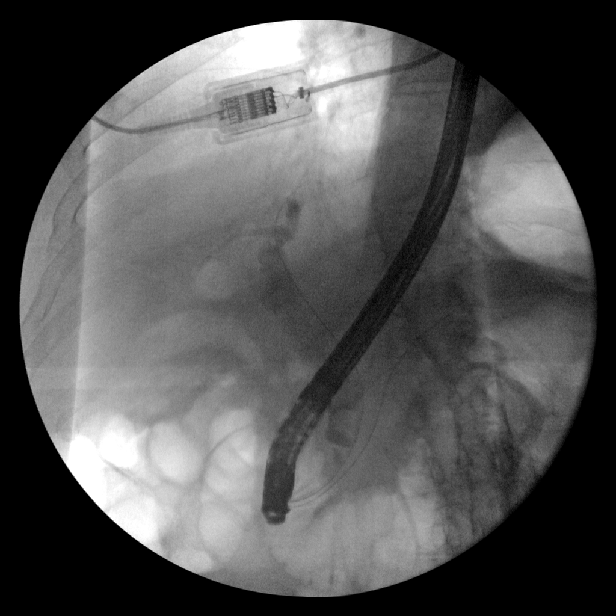
[im 7/10]
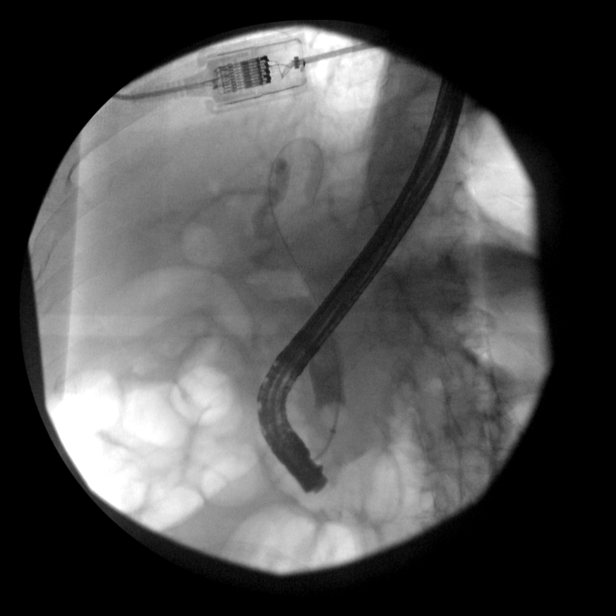
[im 7/10]
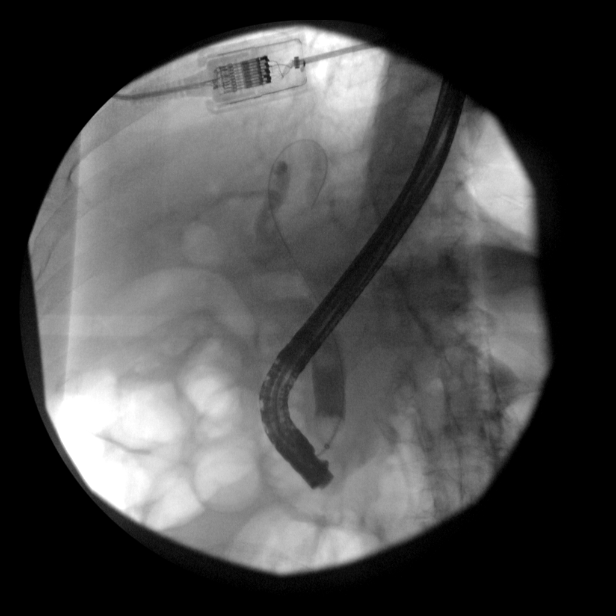
[im 8/10]
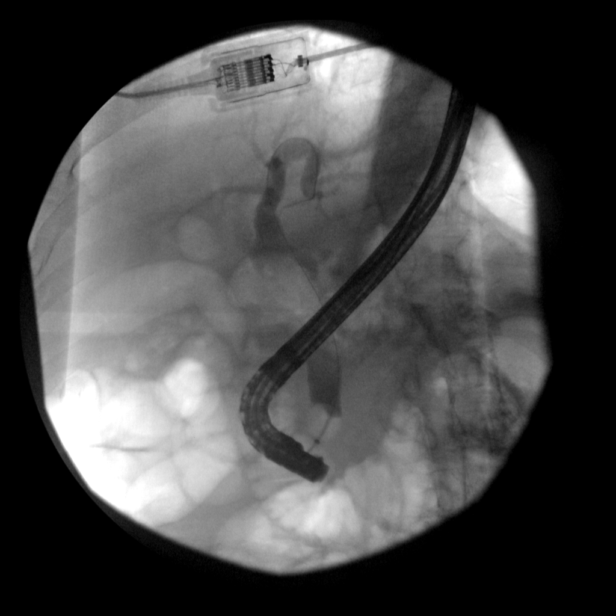
[im 8/10]
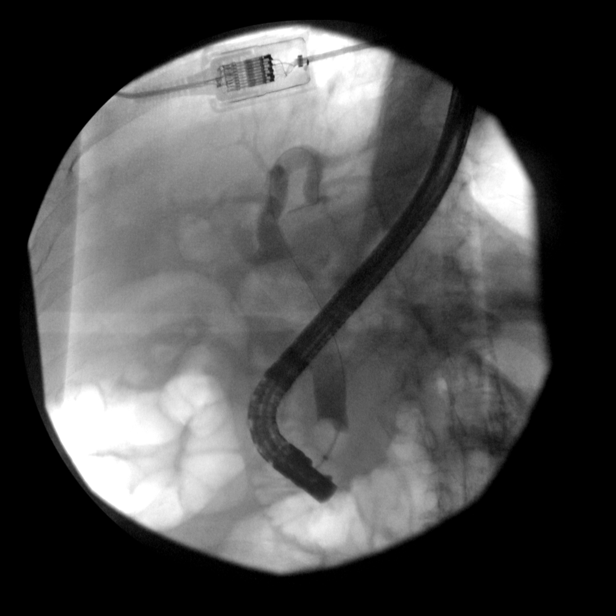
[im 9/10]
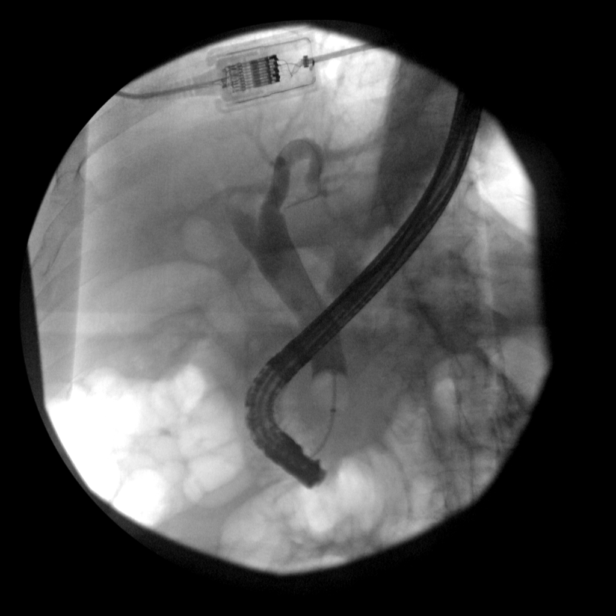
[im 9/10]
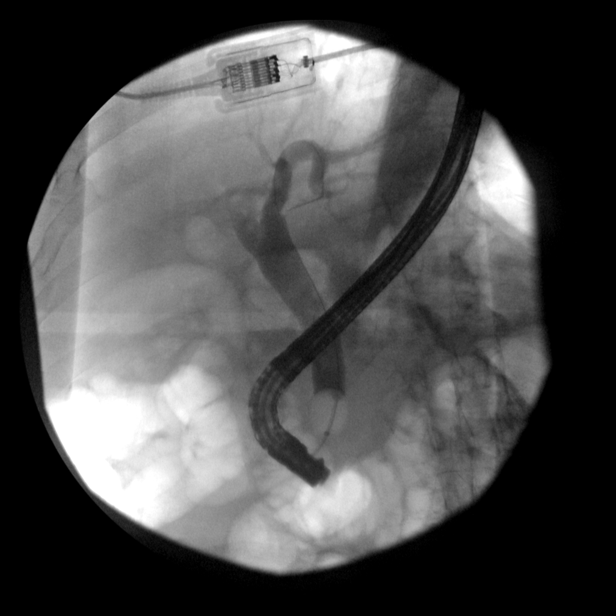
[im 10/10]
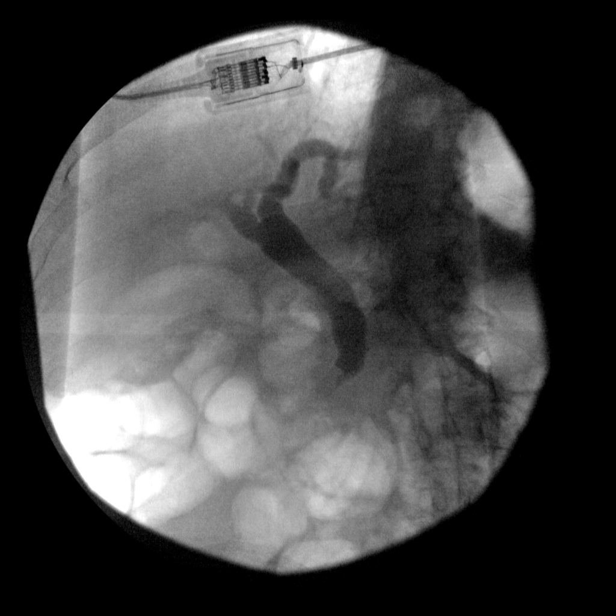

[14 of 24 positions shown; findings below may reference images not displayed]

FINDINGS: Ten spot intraoperative fluoroscopic images of the right upper
abdominal quadrant during ERCP are provided for review

Initial image demonstrates an ERCP probe overlying the right upper
abdominal quadrant. There is a suspected cannulation of the
pancreatic duct.

Subsequent images demonstrate selective cannulation and
opacification of the common bile duct with several persistent
filling defects seen within the CBD suggestive of
choledocholithiasis demonstrated on preceding MRCP.

Subsequent images demonstrate insufflation of a balloon within the
central / mid aspect of the CBD with subsequent presumed sweeping
and sphincterotomy.

The common bile duct appears mildly dilated.

There is minimal opacification of the central aspect of the
intrahepatic biliary tree which appears very mildly dilated. There
is no definitive opacification of the cystic or pancreatic ducts.
IMPRESSION: ERCP with findings of choledocholithiasis with subsequent balloon
sweeping and presumed sphincterotomy as detailed above.

These images were submitted for radiologic interpretation only.
Please see the procedural report for the amount of contrast and the
fluoroscopy time utilized.
